# Patient Record
Sex: Female | Born: 1965 | Race: White | Hispanic: No | Smoking: Current every day smoker
Health system: Southern US, Community
[De-identification: ages and names within clinical notes are randomized; demographics above are authoritative.]

## PROBLEM LIST (undated history)

## (undated) DIAGNOSIS — K635 Polyp of colon: Secondary | ICD-10-CM

## (undated) DIAGNOSIS — F4320 Adjustment disorder, unspecified: Secondary | ICD-10-CM

## (undated) DIAGNOSIS — M4310 Spondylolisthesis, site unspecified: Secondary | ICD-10-CM

## (undated) DIAGNOSIS — F4321 Adjustment disorder with depressed mood: Secondary | ICD-10-CM

## (undated) DIAGNOSIS — K219 Gastro-esophageal reflux disease without esophagitis: Secondary | ICD-10-CM

## (undated) DIAGNOSIS — F4310 Post-traumatic stress disorder, unspecified: Secondary | ICD-10-CM

## (undated) DIAGNOSIS — N83209 Unspecified ovarian cyst, unspecified side: Secondary | ICD-10-CM

## (undated) DIAGNOSIS — M722 Plantar fascial fibromatosis: Secondary | ICD-10-CM

## (undated) DIAGNOSIS — Z8489 Family history of other specified conditions: Secondary | ICD-10-CM

## (undated) DIAGNOSIS — L732 Hidradenitis suppurativa: Secondary | ICD-10-CM

## (undated) DIAGNOSIS — R002 Palpitations: Secondary | ICD-10-CM

## (undated) DIAGNOSIS — R51 Headache: Secondary | ICD-10-CM

## (undated) DIAGNOSIS — M1990 Unspecified osteoarthritis, unspecified site: Secondary | ICD-10-CM

## (undated) DIAGNOSIS — K2270 Barrett's esophagus without dysplasia: Secondary | ICD-10-CM

## (undated) DIAGNOSIS — F32A Depression, unspecified: Secondary | ICD-10-CM

## (undated) DIAGNOSIS — F329 Major depressive disorder, single episode, unspecified: Secondary | ICD-10-CM

## (undated) DIAGNOSIS — F319 Bipolar disorder, unspecified: Secondary | ICD-10-CM

## (undated) DIAGNOSIS — F419 Anxiety disorder, unspecified: Secondary | ICD-10-CM

## (undated) DIAGNOSIS — K589 Irritable bowel syndrome without diarrhea: Secondary | ICD-10-CM

## (undated) DIAGNOSIS — F1190 Opioid use, unspecified, uncomplicated: Secondary | ICD-10-CM

## (undated) DIAGNOSIS — M1611 Unilateral primary osteoarthritis, right hip: Secondary | ICD-10-CM

## (undated) DIAGNOSIS — F17210 Nicotine dependence, cigarettes, uncomplicated: Secondary | ICD-10-CM

## (undated) DIAGNOSIS — K3184 Gastroparesis: Secondary | ICD-10-CM

## (undated) DIAGNOSIS — M75100 Unspecified rotator cuff tear or rupture of unspecified shoulder, not specified as traumatic: Secondary | ICD-10-CM

## (undated) DIAGNOSIS — F410 Panic disorder [episodic paroxysmal anxiety] without agoraphobia: Secondary | ICD-10-CM

## (undated) DIAGNOSIS — E119 Type 2 diabetes mellitus without complications: Secondary | ICD-10-CM

## (undated) DIAGNOSIS — F411 Generalized anxiety disorder: Secondary | ICD-10-CM

## (undated) DIAGNOSIS — E785 Hyperlipidemia, unspecified: Secondary | ICD-10-CM

## (undated) DIAGNOSIS — G8929 Other chronic pain: Secondary | ICD-10-CM

## (undated) DIAGNOSIS — G2581 Restless legs syndrome: Secondary | ICD-10-CM

## (undated) DIAGNOSIS — G4733 Obstructive sleep apnea (adult) (pediatric): Secondary | ICD-10-CM

## (undated) DIAGNOSIS — Z720 Tobacco use: Secondary | ICD-10-CM

## (undated) DIAGNOSIS — R519 Headache, unspecified: Secondary | ICD-10-CM

## (undated) DIAGNOSIS — R0609 Other forms of dyspnea: Secondary | ICD-10-CM

## (undated) DIAGNOSIS — Z87442 Personal history of urinary calculi: Secondary | ICD-10-CM

## (undated) HISTORY — DX: Tobacco use: Z72.0

## (undated) HISTORY — DX: Restless legs syndrome: G25.81

## (undated) HISTORY — DX: Depression, unspecified: F32.A

## (undated) HISTORY — DX: Major depressive disorder, single episode, unspecified: F32.9

## (undated) HISTORY — DX: Adjustment disorder with depressed mood: F43.21

## (undated) HISTORY — PX: CHOLECYSTECTOMY: SHX55

## (undated) HISTORY — PX: PLANTAR FASCIA SURGERY: SHX746

## (undated) HISTORY — DX: Unspecified osteoarthritis, unspecified site: M19.90

## (undated) HISTORY — PX: GANGLION CYST EXCISION: SHX1691

## (undated) HISTORY — PX: WRIST SURGERY: SHX841

## (undated) HISTORY — DX: Plantar fascial fibromatosis: M72.2

## (undated) HISTORY — DX: Headache, unspecified: R51.9

## (undated) HISTORY — DX: Anxiety disorder, unspecified: F41.9

## (undated) HISTORY — DX: Hidradenitis suppurativa: L73.2

## (undated) HISTORY — PX: ABDOMINAL HYSTERECTOMY: SHX81

## (undated) HISTORY — DX: Polyp of colon: K63.5

## (undated) HISTORY — DX: Headache: R51

## (undated) HISTORY — DX: Irritable bowel syndrome, unspecified: K58.9

## (undated) HISTORY — DX: Spondylolisthesis, site unspecified: M43.10

## (undated) HISTORY — DX: Barrett's esophagus without dysplasia: K22.70

## (undated) HISTORY — DX: Adjustment disorder, unspecified: F43.20

## (undated) HISTORY — DX: Unspecified ovarian cyst, unspecified side: N83.209

## (undated) HISTORY — DX: Other chronic pain: G89.29

## (undated) HISTORY — PX: OTHER SURGICAL HISTORY: SHX169

## (undated) HISTORY — PX: TUBAL LIGATION: SHX77

## (undated) HISTORY — DX: Obstructive sleep apnea (adult) (pediatric): G47.33

## (undated) HISTORY — PX: ELBOW SURGERY: SHX618

---

## 1997-11-05 ENCOUNTER — Ambulatory Visit (HOSPITAL_BASED_OUTPATIENT_CLINIC_OR_DEPARTMENT_OTHER): Admission: RE | Admit: 1997-11-05 | Discharge: 1997-11-05 | Payer: Self-pay | Admitting: Orthopaedic Surgery

## 1998-03-21 ENCOUNTER — Ambulatory Visit (HOSPITAL_BASED_OUTPATIENT_CLINIC_OR_DEPARTMENT_OTHER): Admission: RE | Admit: 1998-03-21 | Discharge: 1998-03-21 | Payer: Self-pay | Admitting: Orthopaedic Surgery

## 2000-12-12 ENCOUNTER — Encounter: Payer: Self-pay | Admitting: Internal Medicine

## 2000-12-12 ENCOUNTER — Encounter: Admission: RE | Admit: 2000-12-12 | Discharge: 2000-12-12 | Payer: Self-pay | Admitting: Internal Medicine

## 2001-02-22 DIAGNOSIS — T1491XA Suicide attempt, initial encounter: Secondary | ICD-10-CM

## 2001-02-22 HISTORY — DX: Suicide attempt, initial encounter: T14.91XA

## 2001-02-24 ENCOUNTER — Other Ambulatory Visit: Admission: RE | Admit: 2001-02-24 | Discharge: 2001-02-24 | Payer: Self-pay | Admitting: Obstetrics and Gynecology

## 2001-08-18 ENCOUNTER — Encounter (INDEPENDENT_AMBULATORY_CARE_PROVIDER_SITE_OTHER): Payer: Self-pay | Admitting: *Deleted

## 2001-08-18 ENCOUNTER — Observation Stay (HOSPITAL_COMMUNITY): Admission: AD | Admit: 2001-08-18 | Discharge: 2001-08-20 | Payer: Self-pay | Admitting: *Deleted

## 2001-08-18 ENCOUNTER — Encounter: Payer: Self-pay | Admitting: *Deleted

## 2001-10-05 ENCOUNTER — Encounter (INDEPENDENT_AMBULATORY_CARE_PROVIDER_SITE_OTHER): Payer: Self-pay | Admitting: *Deleted

## 2001-10-05 ENCOUNTER — Ambulatory Visit (HOSPITAL_COMMUNITY): Admission: RE | Admit: 2001-10-05 | Discharge: 2001-10-05 | Payer: Self-pay | Admitting: General Surgery

## 2001-10-05 ENCOUNTER — Encounter: Payer: Self-pay | Admitting: General Surgery

## 2001-10-10 ENCOUNTER — Encounter: Payer: Self-pay | Admitting: General Surgery

## 2001-10-10 ENCOUNTER — Encounter (INDEPENDENT_AMBULATORY_CARE_PROVIDER_SITE_OTHER): Payer: Self-pay

## 2001-10-10 ENCOUNTER — Observation Stay (HOSPITAL_COMMUNITY): Admission: RE | Admit: 2001-10-10 | Discharge: 2001-10-11 | Payer: Self-pay | Admitting: General Surgery

## 2001-11-13 ENCOUNTER — Other Ambulatory Visit (HOSPITAL_COMMUNITY): Admission: RE | Admit: 2001-11-13 | Discharge: 2001-11-24 | Payer: Self-pay | Admitting: Psychiatry

## 2001-11-27 ENCOUNTER — Encounter: Admission: RE | Admit: 2001-11-27 | Discharge: 2001-11-27 | Payer: Self-pay | Admitting: Psychiatry

## 2001-12-04 ENCOUNTER — Encounter: Admission: RE | Admit: 2001-12-04 | Discharge: 2001-12-04 | Payer: Self-pay | Admitting: Psychiatry

## 2001-12-12 ENCOUNTER — Encounter: Admission: RE | Admit: 2001-12-12 | Discharge: 2001-12-12 | Payer: Self-pay | Admitting: Psychiatry

## 2001-12-25 ENCOUNTER — Encounter: Admission: RE | Admit: 2001-12-25 | Discharge: 2001-12-25 | Payer: Self-pay | Admitting: Psychiatry

## 2002-01-29 ENCOUNTER — Encounter: Admission: RE | Admit: 2002-01-29 | Discharge: 2002-01-29 | Payer: Self-pay | Admitting: Psychiatry

## 2002-01-30 ENCOUNTER — Encounter: Admission: RE | Admit: 2002-01-30 | Discharge: 2002-01-30 | Payer: Self-pay | Admitting: Psychiatry

## 2002-02-01 ENCOUNTER — Encounter: Admission: RE | Admit: 2002-02-01 | Discharge: 2002-02-01 | Payer: Self-pay | Admitting: Psychiatry

## 2002-02-26 ENCOUNTER — Encounter: Admission: RE | Admit: 2002-02-26 | Discharge: 2002-02-26 | Payer: Self-pay | Admitting: Psychiatry

## 2002-03-01 ENCOUNTER — Encounter: Admission: RE | Admit: 2002-03-01 | Discharge: 2002-03-01 | Payer: Self-pay | Admitting: Professional Counselor

## 2002-06-25 ENCOUNTER — Encounter: Admission: RE | Admit: 2002-06-25 | Discharge: 2002-06-25 | Payer: Self-pay | Admitting: Professional Counselor

## 2002-10-15 ENCOUNTER — Other Ambulatory Visit: Admission: RE | Admit: 2002-10-15 | Discharge: 2002-10-15 | Payer: Self-pay | Admitting: Obstetrics and Gynecology

## 2002-10-18 ENCOUNTER — Ambulatory Visit: Admission: RE | Admit: 2002-10-18 | Discharge: 2002-10-18 | Payer: Self-pay | Admitting: Obstetrics and Gynecology

## 2002-10-18 ENCOUNTER — Encounter: Payer: Self-pay | Admitting: Obstetrics and Gynecology

## 2002-11-13 ENCOUNTER — Ambulatory Visit (HOSPITAL_COMMUNITY): Admission: RE | Admit: 2002-11-13 | Discharge: 2002-11-13 | Payer: Self-pay | Admitting: Pediatrics

## 2002-11-13 ENCOUNTER — Encounter (INDEPENDENT_AMBULATORY_CARE_PROVIDER_SITE_OTHER): Payer: Self-pay | Admitting: Specialist

## 2003-09-05 ENCOUNTER — Ambulatory Visit (HOSPITAL_COMMUNITY): Admission: RE | Admit: 2003-09-05 | Discharge: 2003-09-05 | Payer: Self-pay | Admitting: Obstetrics and Gynecology

## 2003-10-10 ENCOUNTER — Other Ambulatory Visit: Admission: RE | Admit: 2003-10-10 | Discharge: 2003-10-10 | Payer: Self-pay | Admitting: Obstetrics and Gynecology

## 2004-03-05 ENCOUNTER — Other Ambulatory Visit: Admission: RE | Admit: 2004-03-05 | Discharge: 2004-03-05 | Payer: Self-pay | Admitting: Obstetrics and Gynecology

## 2005-07-16 ENCOUNTER — Observation Stay (HOSPITAL_COMMUNITY): Admission: EM | Admit: 2005-07-16 | Discharge: 2005-07-17 | Payer: Self-pay | Admitting: Emergency Medicine

## 2005-07-16 ENCOUNTER — Ambulatory Visit: Payer: Self-pay | Admitting: Cardiology

## 2005-07-22 ENCOUNTER — Encounter: Payer: Self-pay | Admitting: Internal Medicine

## 2005-07-22 LAB — CONVERTED CEMR LAB

## 2005-07-27 ENCOUNTER — Ambulatory Visit: Payer: Self-pay

## 2005-07-29 ENCOUNTER — Ambulatory Visit: Payer: Self-pay | Admitting: Cardiology

## 2005-07-29 ENCOUNTER — Encounter: Payer: Self-pay | Admitting: Internal Medicine

## 2005-07-29 LAB — CONVERTED CEMR LAB
Blood Glucose, Fasting: 74 mg/dL
INR: 1
aPTT: 29 s

## 2005-08-02 ENCOUNTER — Inpatient Hospital Stay (HOSPITAL_BASED_OUTPATIENT_CLINIC_OR_DEPARTMENT_OTHER): Admission: RE | Admit: 2005-08-02 | Discharge: 2005-08-02 | Payer: Self-pay | Admitting: Cardiology

## 2005-08-02 ENCOUNTER — Ambulatory Visit: Payer: Self-pay | Admitting: Cardiology

## 2005-08-19 ENCOUNTER — Ambulatory Visit: Payer: Self-pay | Admitting: Internal Medicine

## 2005-08-27 ENCOUNTER — Ambulatory Visit: Payer: Self-pay | Admitting: Internal Medicine

## 2005-09-10 ENCOUNTER — Ambulatory Visit: Payer: Self-pay | Admitting: Internal Medicine

## 2005-10-12 ENCOUNTER — Ambulatory Visit: Payer: Self-pay | Admitting: Internal Medicine

## 2005-10-13 ENCOUNTER — Encounter (INDEPENDENT_AMBULATORY_CARE_PROVIDER_SITE_OTHER): Payer: Self-pay | Admitting: Specialist

## 2005-10-13 ENCOUNTER — Ambulatory Visit: Payer: Self-pay | Admitting: Internal Medicine

## 2005-10-15 ENCOUNTER — Ambulatory Visit: Payer: Self-pay | Admitting: Internal Medicine

## 2005-12-15 ENCOUNTER — Ambulatory Visit: Payer: Self-pay | Admitting: Internal Medicine

## 2005-12-25 ENCOUNTER — Emergency Department (HOSPITAL_COMMUNITY): Admission: EM | Admit: 2005-12-25 | Discharge: 2005-12-25 | Payer: Self-pay | Admitting: Emergency Medicine

## 2005-12-27 ENCOUNTER — Encounter (INDEPENDENT_AMBULATORY_CARE_PROVIDER_SITE_OTHER): Payer: Self-pay | Admitting: *Deleted

## 2005-12-27 ENCOUNTER — Inpatient Hospital Stay (HOSPITAL_COMMUNITY): Admission: AD | Admit: 2005-12-27 | Discharge: 2005-12-27 | Payer: Self-pay | Admitting: Obstetrics and Gynecology

## 2006-01-03 ENCOUNTER — Ambulatory Visit: Payer: Self-pay | Admitting: Internal Medicine

## 2006-01-12 ENCOUNTER — Ambulatory Visit: Payer: Self-pay | Admitting: Internal Medicine

## 2006-01-12 ENCOUNTER — Encounter (INDEPENDENT_AMBULATORY_CARE_PROVIDER_SITE_OTHER): Payer: Self-pay | Admitting: *Deleted

## 2006-01-12 LAB — HM COLONOSCOPY: HM Colonoscopy: ABNORMAL

## 2006-01-24 ENCOUNTER — Encounter (INDEPENDENT_AMBULATORY_CARE_PROVIDER_SITE_OTHER): Payer: Self-pay | Admitting: *Deleted

## 2006-01-25 ENCOUNTER — Inpatient Hospital Stay (HOSPITAL_COMMUNITY): Admission: RE | Admit: 2006-01-25 | Discharge: 2006-01-26 | Payer: Self-pay | Admitting: Obstetrics and Gynecology

## 2006-12-17 ENCOUNTER — Encounter: Payer: Self-pay | Admitting: Internal Medicine

## 2007-01-27 ENCOUNTER — Ambulatory Visit: Payer: Self-pay | Admitting: Internal Medicine

## 2007-08-24 ENCOUNTER — Ambulatory Visit: Payer: Self-pay | Admitting: Internal Medicine

## 2007-08-24 ENCOUNTER — Telehealth: Payer: Self-pay | Admitting: Internal Medicine

## 2007-08-31 ENCOUNTER — Ambulatory Visit: Payer: Self-pay | Admitting: Internal Medicine

## 2007-08-31 ENCOUNTER — Encounter: Payer: Self-pay | Admitting: Internal Medicine

## 2007-09-02 ENCOUNTER — Encounter: Payer: Self-pay | Admitting: Internal Medicine

## 2007-09-18 ENCOUNTER — Ambulatory Visit: Payer: Self-pay | Admitting: Internal Medicine

## 2007-09-28 ENCOUNTER — Ambulatory Visit: Payer: Self-pay | Admitting: Internal Medicine

## 2007-09-28 DIAGNOSIS — G8929 Other chronic pain: Secondary | ICD-10-CM | POA: Insufficient documentation

## 2007-09-28 DIAGNOSIS — R51 Headache: Secondary | ICD-10-CM

## 2008-02-15 ENCOUNTER — Encounter: Payer: Self-pay | Admitting: Internal Medicine

## 2008-03-07 ENCOUNTER — Encounter: Payer: Self-pay | Admitting: Internal Medicine

## 2008-03-11 ENCOUNTER — Telehealth: Payer: Self-pay | Admitting: Internal Medicine

## 2008-06-17 ENCOUNTER — Encounter: Payer: Self-pay | Admitting: Internal Medicine

## 2008-08-16 ENCOUNTER — Telehealth: Payer: Self-pay | Admitting: Internal Medicine

## 2008-10-03 ENCOUNTER — Ambulatory Visit: Payer: Self-pay | Admitting: Internal Medicine

## 2008-10-04 ENCOUNTER — Emergency Department (HOSPITAL_COMMUNITY): Admission: EM | Admit: 2008-10-04 | Discharge: 2008-10-04 | Payer: Self-pay | Admitting: Emergency Medicine

## 2008-10-04 DIAGNOSIS — K2270 Barrett's esophagus without dysplasia: Secondary | ICD-10-CM | POA: Insufficient documentation

## 2008-10-04 DIAGNOSIS — IMO0002 Reserved for concepts with insufficient information to code with codable children: Secondary | ICD-10-CM | POA: Insufficient documentation

## 2008-10-09 ENCOUNTER — Ambulatory Visit: Payer: Self-pay | Admitting: Internal Medicine

## 2008-10-10 ENCOUNTER — Ambulatory Visit: Payer: Self-pay | Admitting: Internal Medicine

## 2008-10-25 ENCOUNTER — Ambulatory Visit: Payer: Self-pay | Admitting: Licensed Clinical Social Worker

## 2008-11-11 ENCOUNTER — Ambulatory Visit: Payer: Self-pay | Admitting: Internal Medicine

## 2008-11-22 ENCOUNTER — Telehealth: Payer: Self-pay | Admitting: Internal Medicine

## 2008-11-28 ENCOUNTER — Telehealth: Payer: Self-pay | Admitting: Internal Medicine

## 2009-01-15 ENCOUNTER — Telehealth: Payer: Self-pay | Admitting: Family

## 2009-02-24 ENCOUNTER — Encounter: Payer: Self-pay | Admitting: Internal Medicine

## 2009-04-28 ENCOUNTER — Ambulatory Visit: Payer: Self-pay | Admitting: Internal Medicine

## 2009-04-28 LAB — CONVERTED CEMR LAB
ALT: 19 units/L (ref 0–35)
Alkaline Phosphatase: 56 units/L (ref 39–117)
BUN: 10 mg/dL (ref 6–23)
Basophils Absolute: 0 10*3/uL (ref 0.0–0.1)
Basophils Relative: 0.7 % (ref 0.0–3.0)
Bilirubin Urine: NEGATIVE
CO2: 29 meq/L (ref 19–32)
Creatinine, Ser: 0.9 mg/dL (ref 0.4–1.2)
Eosinophils Absolute: 0.1 10*3/uL (ref 0.0–0.7)
Eosinophils Relative: 1.8 % (ref 0.0–5.0)
Hemoglobin: 11.9 g/dL — ABNORMAL LOW (ref 12.0–15.0)
Ketones, ur: NEGATIVE mg/dL
Lymphs Abs: 2 10*3/uL (ref 0.7–4.0)
MCV: 95.9 fL (ref 78.0–100.0)
Monocytes Absolute: 0.5 10*3/uL (ref 0.1–1.0)
Neutro Abs: 2.3 10*3/uL (ref 1.4–7.7)
Neutrophils Relative %: 46.9 % (ref 43.0–77.0)
Nitrite: NEGATIVE
Potassium: 3.7 meq/L (ref 3.5–5.1)
RBC: 3.64 M/uL — ABNORMAL LOW (ref 3.87–5.11)
Sodium: 141 meq/L (ref 135–145)
Total Protein: 6.4 g/dL (ref 6.0–8.3)
Triglycerides: 100 mg/dL (ref 0.0–149.0)
Urobilinogen, UA: 1 (ref 0.0–1.0)
VLDL: 20 mg/dL (ref 0.0–40.0)
WBC: 4.9 10*3/uL (ref 4.5–10.5)

## 2009-05-05 ENCOUNTER — Ambulatory Visit: Payer: Self-pay | Admitting: Internal Medicine

## 2009-05-05 DIAGNOSIS — E785 Hyperlipidemia, unspecified: Secondary | ICD-10-CM | POA: Insufficient documentation

## 2009-05-06 ENCOUNTER — Telehealth: Payer: Self-pay | Admitting: Internal Medicine

## 2009-05-12 ENCOUNTER — Telehealth: Payer: Self-pay | Admitting: Internal Medicine

## 2009-05-12 ENCOUNTER — Ambulatory Visit (HOSPITAL_BASED_OUTPATIENT_CLINIC_OR_DEPARTMENT_OTHER): Admission: RE | Admit: 2009-05-12 | Discharge: 2009-05-12 | Payer: Self-pay | Admitting: Internal Medicine

## 2009-05-12 ENCOUNTER — Ambulatory Visit: Payer: Self-pay | Admitting: Internal Medicine

## 2009-05-12 ENCOUNTER — Ambulatory Visit: Payer: Self-pay | Admitting: Diagnostic Radiology

## 2009-05-26 ENCOUNTER — Telehealth: Payer: Self-pay | Admitting: Internal Medicine

## 2009-05-26 ENCOUNTER — Encounter: Payer: Self-pay | Admitting: Internal Medicine

## 2009-06-25 ENCOUNTER — Telehealth: Payer: Self-pay | Admitting: Internal Medicine

## 2009-07-22 ENCOUNTER — Encounter (INDEPENDENT_AMBULATORY_CARE_PROVIDER_SITE_OTHER): Payer: Self-pay | Admitting: *Deleted

## 2009-07-24 ENCOUNTER — Ambulatory Visit: Payer: Self-pay | Admitting: Internal Medicine

## 2009-08-04 ENCOUNTER — Ambulatory Visit: Payer: Self-pay | Admitting: Diagnostic Radiology

## 2009-08-04 ENCOUNTER — Ambulatory Visit (HOSPITAL_BASED_OUTPATIENT_CLINIC_OR_DEPARTMENT_OTHER): Admission: RE | Admit: 2009-08-04 | Discharge: 2009-08-04 | Payer: Self-pay | Admitting: Internal Medicine

## 2009-08-04 ENCOUNTER — Ambulatory Visit: Payer: Self-pay | Admitting: Internal Medicine

## 2009-08-05 ENCOUNTER — Encounter: Payer: Self-pay | Admitting: Internal Medicine

## 2009-08-07 ENCOUNTER — Ambulatory Visit: Payer: Self-pay | Admitting: Internal Medicine

## 2009-08-11 ENCOUNTER — Encounter: Payer: Self-pay | Admitting: Internal Medicine

## 2009-09-01 ENCOUNTER — Telehealth: Payer: Self-pay | Admitting: Internal Medicine

## 2009-09-08 ENCOUNTER — Telehealth: Payer: Self-pay | Admitting: Internal Medicine

## 2009-09-26 ENCOUNTER — Encounter: Payer: Self-pay | Admitting: Internal Medicine

## 2009-09-26 ENCOUNTER — Ambulatory Visit (HOSPITAL_BASED_OUTPATIENT_CLINIC_OR_DEPARTMENT_OTHER): Admission: RE | Admit: 2009-09-26 | Discharge: 2009-09-26 | Payer: Self-pay | Admitting: Internal Medicine

## 2009-10-06 ENCOUNTER — Ambulatory Visit: Payer: Self-pay | Admitting: Internal Medicine

## 2009-10-09 ENCOUNTER — Ambulatory Visit: Payer: Self-pay | Admitting: Pulmonary Disease

## 2009-10-13 ENCOUNTER — Telehealth: Payer: Self-pay | Admitting: Internal Medicine

## 2009-11-20 ENCOUNTER — Ambulatory Visit: Payer: Self-pay | Admitting: Internal Medicine

## 2009-11-21 ENCOUNTER — Telehealth: Payer: Self-pay | Admitting: Internal Medicine

## 2009-12-05 ENCOUNTER — Telehealth: Payer: Self-pay | Admitting: Internal Medicine

## 2009-12-29 ENCOUNTER — Telehealth: Payer: Self-pay | Admitting: Internal Medicine

## 2010-01-20 ENCOUNTER — Ambulatory Visit: Payer: Self-pay | Admitting: Internal Medicine

## 2010-01-21 ENCOUNTER — Telehealth: Payer: Self-pay | Admitting: Internal Medicine

## 2010-01-22 ENCOUNTER — Ambulatory Visit: Payer: Self-pay | Admitting: Internal Medicine

## 2010-01-22 LAB — CONVERTED CEMR LAB
Eosinophils Absolute: 0.1 10*3/uL (ref 0.0–0.7)
Eosinophils Relative: 1.4 % (ref 0.0–5.0)
Lymphs Abs: 3.8 10*3/uL (ref 0.7–4.0)
MCHC: 34.7 g/dL (ref 30.0–36.0)
MCV: 95.9 fL (ref 78.0–100.0)
Monocytes Relative: 8.3 % (ref 3.0–12.0)
Platelets: 165 10*3/uL (ref 150.0–400.0)
RDW: 12.9 % (ref 11.5–14.6)
Saturation Ratios: 25.3 % (ref 20.0–50.0)

## 2010-01-23 ENCOUNTER — Encounter: Payer: Self-pay | Admitting: Internal Medicine

## 2010-02-22 HISTORY — PX: COLONOSCOPY W/ POLYPECTOMY: SHX1380

## 2010-03-26 NOTE — Medication Information (Signed)
Summary: Prior Authorization for Venlafaxine/[employer] [place]  Prior Authorization for Venlafaxine/[employer] [place]   Imported By: Lanelle Bal 05/30/2009 08:54:37  _____________________________________________________________________  External Attachment:    Type:   Image     Comment:   External Document

## 2010-03-26 NOTE — Progress Notes (Signed)
Summary: Xanax refill  Phone Note Refill Request Message from:  Fax from Pharmacy on Jun 25, 2009 4:33 PM  Refills Requested: Medication #1:  XANAX 0.25 MG   Brand Name Necessary? No   Supply Requested: 1 month   Last Refilled: 01/15/2009  Method Requested: Telephone to Pharmacy Next Appointment Scheduled: None Initial call taken by: Glendell Docker CMA,  Jun 25, 2009 4:34 PM  Follow-up for Phone Call        ok to refill x 1.  I suggest OV within 2 months Follow-up by: D. Thomos Lemons DO,  Jun 26, 2009 10:18 AM  Additional Follow-up for Phone Call Additional follow up Details #1::        Rx called to pharmacist Trinna Post. Call placed to patient at [phone] she was advised to schedule 2 month follow up per Dr Artist Pais. She has scheduled appointment for 08/04/2009 @ 2:15p Additional Follow-up by: Glendell Docker CMA,  Jun 26, 2009 3:42 PM    New/Updated Medications: XANAX 0.25 MG TABS (ALPRAZOLAM) Take 1 tablet by mouth once a day as needed Prescriptions: XANAX 0.25 MG TABS (ALPRAZOLAM) Take 1 tablet by mouth once a day as needed  #30 x 1   Entered by:   Glendell Docker CMA   Authorized by:   D. Thomos Lemons DO   Signed by:   Glendell Docker CMA on 06/26/2009   Method used:   Telephoned to ...       [id number]       [address]       [place], [place]  [address]       Ph: [phone] or [phone]       Fax: [phone]   RxID:   [id number]

## 2010-03-26 NOTE — Assessment & Plan Note (Signed)
Summary: 2 MONTH FOLLOW UP/DK--rm 3   Vital Signs:  Patient profile:   45 year old female Height:      66 inches Weight:      214 pounds BMI:     34.67 Temp:     98.1 degrees F oral Pulse rate:   72 / minute Pulse rhythm:   regular Resp:     16 per minute BP sitting:   110 / 70  (right arm) Cuff size:   large  Vitals Entered By: Mervin Kung CMA (August 04, 2009 2:54 PM) CC: Room 3  Pt states she stubbed her right great toe this weekend.  Toe is swollen and it is difficult to walk.   Primary Care Provider:  Dondra Spry DO  CC:  Room 3  Pt states she stubbed her right great toe this weekend.  Toe is swollen and it is difficult to walk.Marland Kitchen  History of Present Illness: 45 y/o adjustment d/o with depressed /anxious mood - some improvement with effexor  c/o right toe pain she hit her great toe against concrete she was clogging - country dance significant pain with walking   Allergies: 1)  ! Pcn  Past History:  Past Medical History: History of depression Tobacco use Hx of plantar fascitis      Barrett's Esophagus     COLONIC POLYPS, HYPERPLASTIC, HX OF (ICD-V12.72) GASTRITIS, HX OF (ICD-V12.79) INTERNAL HEMORRHOIDS (ICD-455.0) Family Hx of COLON CANCER (ICD-153.9)  IRRITABLE BOWEL SYNDROME (ICD-564.1) BARRETTS ESOPHAGUS (ICD-530.85) GRIEF REACTION (ICD-309.0) HEADACHE, CHRONIC (ICD-784.0) SUNBURN (ICD-692.71) HIDRADENITIS SUPPURATIVA (ICD-705.83) BRONCHITIS (ICD-490) CHEST PAIN, ATYPICAL (ICD-786.59) TOBACCO ABUSE (ICD-305.1) Hx of PLANTAR FASCIITIS (ICD-728.71) DEPRESSION, HX OF (ICD-V11.8)  Past Surgical History: Wrist Surgey   Cesarean section x 2 Cholecystectomy   Tubal ligation   Surgery for plantar fascitis-right foot   Family History:  Mother alive at age 50 has chronic obstructive pulmonary disease, coronary artery disease, a history of stroke and also reflex sympathetic dystrophy.   She is status post pacemaker.  Father is age 39 with  hypertension.  She is estranged from her father.  In her grandparents there is a family history of colon cancer.  Family History of Colon Cancer: Maternal Grandfather-diagnosed in his 47's. Family History of Colon Polyps: Maternal grandfather, Mother, Brother x 2 Family History of Breast Cancer: Mother       Social History: Occupation: Metallurgist Divorced  Alcohol use-yes -x 5 per weekend Current Smoker < 1 ppd      Physical Exam  General:  alert and overweight-appearing.   Lungs:  normal respiratory effort and normal breath sounds.   Heart:  normal rate, regular rhythm, and no gallop.   Msk:  left great toe slightly swollen,  minimal redness but very tender to touch Psych:  normally interactive, good eye contact, and not depressed appearing.     Impression & Recommendations:  Problem # 1:  SOMNOLENCE (ICD-780.09) chronic fatigue.  boyfriend reports snoring.  he also noticed restless sleep.  question PLMD. arrange sleep study  Orders: Sleep Disorder Referral (Sleep Disorder)  Problem # 2:  PAIN IN SOFT TISSUES OF LIMB (ICD-729.5) jammed her left great toe on concrete.  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 Take 1 capsule by mouth two times a day 2)  Venlafaxine Hcl 150 Mg Xr24h-cap (  Venlafaxine hcl) .... One by mouth once daily 3)  Xanax 0.25 Mg Tabs (Alprazolam) .... Take 1 tablet by mouth once a day as needed  Patient Instructions: 1)  Please schedule a follow-up appointment in 2 months. Prescriptions: VENLAFAXINE HCL 150 MG XR24H-CAP (VENLAFAXINE HCL) one by mouth once daily  #30 x 3   Entered and Authorized by:   D. Thomos Lemons DO   Signed by:   D. Thomos Lemons DO on 08/04/2009   Method used:   [facility] to        [id number]        [address]       [place], [place]  [address]       Ph: [phone] or [phone]       Fax: [phone]   RxID:   [id number]

## 2010-03-26 NOTE — Progress Notes (Signed)
Summary: NOW RUNNING A FEVER   Phone Note Call from Patient Call back at Home Phone [phone]   Caller: PT LIVE Call For: Jamilya Sarrazin  Summary of Call: SHE IS NOW RUNNING A FEVER OF 100.  SHE WAS IN YESTERDAY AND DR Monee Dembeck SAID SHE HAS BRONCHITIS SHOULD SHE BE WORRIED THAT SHE NOW HAS A FEVER.   THE COUGH MEDICINE IS NOT WORKING WELL.   Initial call taken by: Roselle Locus,  May 06, 2009 1:39 PM  Follow-up for Phone Call        did she start abx Follow-up by: D. Thomos Lemons DO,  May 06, 2009 2:13 PM  Additional Follow-up for Phone Call Additional follow up Details #1::        call placed to patient at [phone], no answer, voice message left for patient to return call to see if she started antibiotics Additional Follow-up by: Glendell Docker CMA,  May 06, 2009 3:40 PM    Additional Follow-up for Phone Call Additional follow up Details #2::    Patient states she started antibotic yesterday      #  [phone] Follow-up by: Darral Dash,  May 06, 2009 4:05 PM  Additional Follow-up for Phone Call Additional follow up Details #3:: Details for Additional Follow-up Action Taken: take ceftin in addition to azithromycin.   see rx Additional Follow-up by: D. Thomos Lemons DO,  May 06, 2009 4:19 PM  New/Updated Medications: CEFUROXIME AXETIL 500 MG TABS (CEFUROXIME AXETIL) one by mouth bid Prescriptions: CEFUROXIME AXETIL 500 MG TABS (CEFUROXIME AXETIL) one by mouth bid  #14 x 0   Entered and Authorized by:   D. Thomos Lemons DO   Signed by:   D. Thomos Lemons DO on 05/06/2009   Method used:   [facility]        [id number]       [address]       [place], [place]  [address]       Ph: [phone] or [phone]       Fax: [phone]   RxID:   [id number]  Patient advised per Dr Artist Pais instructions Glendell Docker CMA  May 06, 2009 4:56 PM

## 2010-03-26 NOTE — Progress Notes (Signed)
Summary:  ? Medication Side effects  Phone Note Call from Patient Call back at Home Phone [phone]   Caller: Patient Call For: D. Thomos Lemons DO Summary of Call: patient called and left voice message stating she took a dose of Klonipin last night and she was "wired". She states she rearranged her dining area, and tossed and turned all night. She would like to know if this is a normal effect of the medication.  Initial call taken by: Glendell Docker CMA,  November 21, 2009 11:47 AM  Follow-up for Phone Call        no, stop clonazepam.  see rx change for diazepam Follow-up by: D. Thomos Lemons DO,  November 21, 2009 11:52 AM  Additional Follow-up for Phone Call Additional follow up Details #1::        call placed to patient at [phone], no answer. A detailed voice message was left informing patient per Dr Artist Pais instructions.  Message was left for patient to call if any questions. Rx called to pharmacy Additional Follow-up by: Glendell Docker CMA,  November 21, 2009 11:58 AM    New/Updated Medications: DIAZEPAM 2 MG TABS (DIAZEPAM) 1/2 to one tab at bedtime as needed Prescriptions: DIAZEPAM 2 MG TABS (DIAZEPAM) 1/2 to one tab at bedtime as needed  #30 x 0   Entered and Authorized by:   D. Thomos Lemons DO   Signed by:   D. Thomos Lemons DO on 11/21/2009   Method used:   Telephoned to ...       [id number]       [address]       [place], [place]  [address]       Ph: [phone] or [phone]       Fax: [phone]   RxID:   [id number]

## 2010-03-26 NOTE — Letter (Signed)
   [place] [facility] [address] [place], [place]  [address]  [place] Phone: [phone]      January 23, 2010   [facility] [address] [place], [place] [address]  RE:  LAB RESULTS  Dear  [name] White,  The following is an interpretation of your most recent lab tests.  Please take note of any instructions provided or changes to medications that have resulted from your lab work.    CBC:  Good - no changes needed  Iron studies - normal   Iron deficiency is not contributing  to your restless leg syndrome.        Sincerely Yours,    Dr. Thomos Lemons  Appended Document:  mailed

## 2010-03-26 NOTE — Letter (Signed)
Summary: Patient [facility] Biopsy Results  [facility]  [address] [place], [place] [address]   Phone: [phone]  Fax: [phone]        August 11, 2009 MRN: [id number]    [facility] [address] [place], [place]  [address]    Dear Ms. Stith,  I am pleased to inform you that the biopsies taken during your recent endoscopic examination did not show any evidence of cancer upon pathologic examination.Mild inflammation due to reflux.  Additional information/recommendations:  __No further action is needed at this time.  Please follow-up with      your primary care physician for your other healthcare needs.  __ Please call [phone] to schedule a return visit to review      your condition.  _x_ Continue with the treatment plan as outlined on the day of your      exam.     Please call us if you are having persistent problems or have questions about your condition that have not been fully answered at this time.  Sincerely,  Hart Carwin MD  This letter has been electronically signed by your physician.  Appended Document: Patient Notice-Endo Biopsy Results letter mailed.

## 2010-03-26 NOTE — Assessment & Plan Note (Signed)
Summary: 2 month follow up/mhf   Vital Signs:  Patient profile:   45 year old female Height:      66 inches Weight:      211.75 pounds BMI:     34.30 O2 Sat:      99 % on Room air Temp:     98.4 degrees F oral Pulse rate:   80 / minute Pulse rhythm:   regular Resp:     20 per minute BP sitting:   108 / 60  (right arm) Cuff size:   large  Vitals Entered By: Glendell Docker CMA (October 06, 2009 3:23 PM)  O2 Flow:  Room air CC: 2 Month Follow up  Is Patient Diabetic? No Pain Assessment Patient in pain? no      Comments follow up on sleep study, no concerns   Primary Care Provider:  Dondra Spry DO  CC:  2 Month Follow up .  History of Present Illness: 45 y/o white female with adj c/o for f/u doing well still feeling tired.  had sleep study  results pending  Preventive Screening-Counseling & Management  Alcohol-Tobacco     Smoking Status: current  Allergies: 1)  ! Pcn  Past History:  Past Medical History: History of depression Tobacco use Hx of plantar fascitis      Barrett's Esophagus      COLONIC POLYPS, HYPERPLASTIC, HX OF (ICD-V12.72) GASTRITIS, HX OF (ICD-V12.79) INTERNAL HEMORRHOIDS (ICD-455.0) Family Hx of COLON CANCER (ICD-153.9)  IRRITABLE BOWEL SYNDROME (ICD-564.1) BARRETTS ESOPHAGUS (ICD-530.85) GRIEF REACTION (ICD-309.0) HEADACHE, CHRONIC (ICD-784.0) SUNBURN (ICD-692.71) HIDRADENITIS SUPPURATIVA (ICD-705.83) BRONCHITIS (ICD-490) CHEST PAIN, ATYPICAL (ICD-786.59) TOBACCO ABUSE (ICD-305.1) Hx of PLANTAR FASCIITIS (ICD-728.71) DEPRESSION, HX OF (ICD-V11.8)  Past Surgical History: Wrist Surgey   Cesarean section x 2  Cholecystectomy   Tubal ligation   Surgery for plantar fascitis-right foot   Family History:  Mother alive at age 15 has chronic obstructive pulmonary disease, coronary artery disease, a history of stroke and also reflex sympathetic dystrophy.   She is status post pacemaker.  Father is age 28 with hypertension.  She is  estranged from her father.  In her grandparents there is a family history of colon cancer.  Family History of Colon Cancer: Maternal Grandfather-diagnosed in his 59's. Family History of Colon Polyps: Maternal grandfather, Mother, Brother x 2 Family History of Breast Cancer: Mother        Social History: Occupation: Metallurgist Divorced  Alcohol use-yes -x 5 per weekend Current Smoker < 1 ppd       Physical Exam  General:  alert, well-developed, and well-nourished.   Lungs:  normal respiratory effort and normal breath sounds.   Heart:  normal rate, regular rhythm, and no gallop.   Psych:  normally interactive, good eye contact, and not depressed appearing.     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  2 lbs wt loss  Problem # 2:  SOMNOLENCE (ICD-780.09) awaiting sleep study results discussed wt loss strategies  Complete Medication List: 1)  Omeprazole 40 Mg Cpdr (Omeprazole) .... Take 1 capsule by mouth two times a day 2)  Venlafaxine Hcl 150 Mg Xr24h-cap (Venlafaxine hcl) .... Take 1 capsule by mouth once a day 3)  Xanax 0.25 Mg Tabs (Alprazolam) .... Take 1 tablet by mouth once a day as needed 4)  Lidocaine Viscous 2 % Soln (Lidocaine hcl) .... Take 1 tablespoon (  15 ml) by mouth every 4 hours as needed for esophageal pain  Patient Instructions: 1)  Please schedule a follow-up appointment in 6 months. 2)  Limit your calories to 1200-1400 cal per day (3 - 5 small meals per day) Prescriptions: XANAX 0.25 MG TABS (ALPRAZOLAM) Take 1 tablet by mouth once a day as needed  #30 x 3   Entered and Authorized by:   D. Thomos Lemons DO   Signed by:   D. Thomos Lemons DO on 10/06/2009   Method used:   Print then Give to Patient   RxID:   [id number] VENLAFAXINE HCL 150 MG XR24H-CAP (VENLAFAXINE HCL) Take 1 capsule by mouth once a day   #30 x 5   Entered and Authorized by:   D. Thomos Lemons DO   Signed by:   D. Thomos Lemons DO on 10/06/2009   Method used:   [facility]        [id number]       [address]       [place], [place]  [address]       Ph: [phone] or [phone]       Fax: [phone]   RxID:   [id number] OMEPRAZOLE 40 MG CPDR (OMEPRAZOLE) Take 1 capsule by mouth two times a day  #60 x 5   Entered and Authorized by:   D. Thomos Lemons DO   Signed by:   D. Thomos Lemons DO on 10/06/2009   Method used:   [facility]        [facility]  [address] [id number]       [address]       [place], [place]  [address]       Ph: [phone] or [phone]       Fax: [phone]   RxID:   [id number]   Current Allergies (reviewed today): ! PCN

## 2010-03-26 NOTE — Medication Information (Signed)
Summary: Approval for Venlafaxine/[employer] [place]  Approval for Venlafaxine/[employer] [place]   Imported By: Lanelle Bal 05/30/2009 09:41:34  _____________________________________________________________________  External Attachment:    Type:   Image     Comment:   External Document

## 2010-03-26 NOTE — Assessment & Plan Note (Signed)
Summary: 2 month fu/dt   Vital Signs:  Patient profile:   45 year old female Height:      66 inches Weight:      200.25 pounds BMI:     32.44 O2 Sat:      100 % on Room air Temp:     97.8 degrees F oral Pulse rate:   75 / minute Resp:     18 per minute BP sitting:   122 / 70  (left arm) Cuff size:   large  Vitals Entered By: Glendell Docker CMA (January 20, 2010 3:59 PM)  O2 Flow:  Room air CC: 2 month follow up  Is Patient Diabetic? No Pain Assessment Patient in pain? no      Comments refill on Gabapentin     Last PAP Result app 12/14   Primary Care Teirra Carapia:  Dondra Spry DO  CC:  2 month follow up .  History of Present Illness: 45 y/o white female with RLS for follow up pt had minimal response to mirapex clonazepam made her feel "wired"  made some dietary changes stopped drinking soft drinks and lost weight   Preventive Screening-Counseling & Management  Alcohol-Tobacco     Smoking Status: current  Allergies: 1)  ! Pcn  Past History:  Past Medical History: History of depression Tobacco use Hx of plantar fascitis       Barrett's Esophagus       COLONIC POLYPS, HYPERPLASTIC, HX OF (ICD-V12.72) GASTRITIS, HX OF (ICD-V12.79) INTERNAL HEMORRHOIDS (ICD-455.0) Family Hx of COLON CANCER (ICD-153.9)  IRRITABLE BOWEL SYNDROME (ICD-564.1) BARRETTS ESOPHAGUS (ICD-530.85) GRIEF REACTION (ICD-309.0) HEADACHE, CHRONIC (ICD-784.0) SUNBURN (ICD-692.71) HIDRADENITIS SUPPURATIVA (ICD-705.83) BRONCHITIS (ICD-490) CHEST PAIN, ATYPICAL (ICD-786.59) TOBACCO ABUSE (ICD-305.1) Hx of PLANTAR FASCIITIS (ICD-728.71) DEPRESSION, HX OF (ICD-V11.8)  Past Surgical History: Wrist Surgey   Cesarean section x 2   Cholecystectomy    Tubal ligation   Surgery for plantar fascitis-right foot   Family History:  Mother alive at age 4 has chronic obstructive pulmonary disease, coronary artery disease, a history of stroke and also reflex sympathetic dystrophy.   She is  status post pacemaker.  Father is age 23 with hypertension.  She is estranged from her father.  In her grandparents there is a family history of colon cancer.  Family History of Colon Cancer: Maternal Grandfather-diagnosed in his 2's. Family History of Colon Polyps: Maternal grandfather, Mother, Brother x 2 Family History of Breast Cancer: Mother         Social History: Occupation: Metallurgist Divorced  Alcohol use-yes -x 5 per weekend Current Smoker < 1 ppd         Physical Exam  General:  alert, well-developed, and well-nourished.   Lungs:  normal respiratory effort and normal breath sounds.   Heart:  normal rate, regular rhythm, and no gallop.   Neurologic:  cranial nerves II-XII intact and gait normal.     Impression & Recommendations:  Problem # 1:  RESTLESS LEG SYNDROME (ICD-333.94) Assessment Unchanged trial of requip pt may need both requip and gabapentin rule out iron deficiency  Problem # 2:  OBESITY (ICD-278.00) Assessment: Improved continue dietary changes encouraged regular exercise  Complete Medication List: 1)  Omeprazole 40 Mg Cpdr (Omeprazole) .... Take 1 capsule by mouth two times a day 2)  Venlafaxine Hcl 150 Mg Xr24h-cap (Venlafaxine hcl) .... Take 1 capsule by mouth once a day 3)  Lidocaine Viscous 2 % Soln (Lidocaine hcl) .... Take 1 tablespoon (15 ml) by mouth every  4 hours as needed for esophageal pain 4)  Ropinirole Hcl 0.5 Mg Tabs (Ropinirole hcl) .... 1/2 by mouth at bedtime x 1 week, then 1 by mouth at bedtime x 1 week, then 2 tabs by mouth qhs  Patient Instructions: 1)  Please schedule a follow-up appointment in 2 months. 2)  CBC no Diff prior to visit, ICD-9: 333.94 3)  serum iron, TIBC, ferritin - 333.94 4)  Please return for lab work within 1 week Prescriptions: ROPINIROLE HCL 0.5 MG TABS (ROPINIROLE HCL) 1/2 by mouth at bedtime x 1 week, then 1 by mouth at bedtime x 1 week, then 2 tabs by mouth qhs  #60 x 2   Entered and  Authorized by:   D. Thomos Lemons DO   Signed by:   D. Thomos Lemons DO on 01/20/2010   Method used:   [facility]        [id number]       [address]       [place], [place]  [address]       Ph: [phone] or [phone]       Fax: [phone]   RxID:   [id number]    Orders Added: 1)  Est. Patient Level III [[address]]    Current Allergies (reviewed today): ! PCN    Preventive Care Screening  Pap Smear:    Date:  01/20/2010    Results:  app 12/14

## 2010-03-26 NOTE — Medication Information (Signed)
Summary: Prior Authorization for Venlafaxine/[employer]  Prior Authorization for Venlafaxine/[employer]   Imported By: Lanelle Bal 05/16/2009 10:24:22  _____________________________________________________________________  External Attachment:    Type:   Image     Comment:   External Document

## 2010-03-26 NOTE — Progress Notes (Signed)
Summary: Unable to Sleep  Phone Note Call from Patient Call back at Home Phone [phone]   Caller: Patient Call For: D. Thomos Lemons DO Summary of Call: patient called and left voice message stating the rx she was given by Dr Artist Pais  for Diazepam, does not work. Her message states that she miread the instructions and was taking a whole tablet, but even with doing that she states she stil is not sleeping.  Initial call taken by: Glendell Docker CMA,  December 05, 2009 2:08 PM  Follow-up for Phone Call        try gabapentin.  see rx Follow-up by: D. Thomos Lemons DO,  December 05, 2009 4:37 PM  Additional Follow-up for Phone Call Additional follow up Details #1::        call placed to patient at [phone],no answer. A detailed voice message was left informing patient per Dr Artist Pais instructions. Message was left for patient to call if any questions. Additional Follow-up by: Glendell Docker CMA,  December 08, 2009 8:44 AM    New/Updated Medications: GABAPENTIN 100 MG CAPS (GABAPENTIN) one to two tabs at bedtime Prescriptions: GABAPENTIN 100 MG CAPS (GABAPENTIN) one to two tabs at bedtime  #60 x 1   Entered and Authorized by:   D. Thomos Lemons DO   Signed by:   D. Thomos Lemons DO on 12/05/2009   Method used:   [facility]        [facility]  [address] [id number]       [address]       [place], [place]  [address]       Ph: [phone] or [phone]       Fax: [phone]   RxID:   [id number]

## 2010-03-26 NOTE — Progress Notes (Signed)
Summary: UTI  Phone Note Call from Patient Call back at Home Phone [phone]   Caller: Patient Call For: D. Thomos Lemons DO Summary of Call: patient called and left voice message requesting a rx for UTI. Her messages states that she knows that she has a urinary tract infection and is just needed a rx to take care of it. She also states that she has tried over the counter products with no relief.    Initial call taken by: Glendell Docker CMA,  December 29, 2009 11:20 AM  Follow-up for Phone Call        call was returned to patient at [phone], no answer. A detailed voice message was left for patient informing her that she will need to schedule a office visit if she was in need of antibiotic, and office policy does not permit to prescribe antibiotics without being seen first. Follow-up by: Glendell Docker CMA,  December 29, 2009 11:32 AM

## 2010-03-26 NOTE — Miscellaneous (Signed)
Summary: [facility] Previsit/prep  Clinical Lists Changes  Observations: Added new observation of ALLERGY REV: Done (07/24/2009 9:56)

## 2010-03-26 NOTE — Consult Note (Signed)
Summary: [facility]  [facility]   Imported By: Lanelle Bal 08/13/2009 11:23:28  _____________________________________________________________________  External Attachment:    Type:   Image     Comment:   External Document

## 2010-03-26 NOTE — Assessment & Plan Note (Signed)
Summary: 1 month fu/dt   Vital Signs:  Patient profile:   45 year old female Height:      66 inches Weight:      218 pounds BMI:     35.31 O2 Sat:      97 % on Room air Temp:     98.3 degrees F oral Pulse rate:   78 / minute Pulse rhythm:   regular Resp:     18 per minute BP sitting:   90 / 60  (right arm) Cuff size:   regular  Vitals Entered By: Glendell Docker CMA (November 20, 2009 4:14 PM)  O2 Flow:  Room air CC: 1 Month Follow up Is Patient Diabetic? No Pain Assessment Patient in pain? no      Comments discuss sleep study   Primary Care Kasiah Manka:  DThomos Lemons DO  CC:  1 Month Follow up.  History of Present Illness: 45 y/o with chronic fatigue for follow up sleep study showed mild OSA significant parasomnia  she is frustrated she gained additional wt reviewed current diet  Preventive Screening-Counseling & Management  Alcohol-Tobacco     Smoking Status: current  Allergies: 1)  ! Pcn  Past History:  Past Medical History: History of depression Tobacco use Hx of plantar fascitis      Barrett's Esophagus       COLONIC POLYPS, HYPERPLASTIC, HX OF (ICD-V12.72) GASTRITIS, HX OF (ICD-V12.79) INTERNAL HEMORRHOIDS (ICD-455.0) Family Hx of COLON CANCER (ICD-153.9)  IRRITABLE BOWEL SYNDROME (ICD-564.1) BARRETTS ESOPHAGUS (ICD-530.85) GRIEF REACTION (ICD-309.0) HEADACHE, CHRONIC (ICD-784.0) SUNBURN (ICD-692.71) HIDRADENITIS SUPPURATIVA (ICD-705.83) BRONCHITIS (ICD-490) CHEST PAIN, ATYPICAL (ICD-786.59) TOBACCO ABUSE (ICD-305.1) Hx of PLANTAR FASCIITIS (ICD-728.71) DEPRESSION, HX OF (ICD-V11.8)  Past Surgical History: Wrist Surgey   Cesarean section x 2   Cholecystectomy   Tubal ligation   Surgery for plantar fascitis-right foot   Social History: Occupation: Metallurgist Divorced  Alcohol use-yes -x 5 per weekend Current Smoker < 1 ppd        Physical Exam  General:  alert, well-developed, and well-nourished.   Lungs:  normal  respiratory effort and normal breath sounds.   Heart:  normal rate, regular rhythm, and no gallop.   Extremities:  No lower extremity edema  Neurologic:  cranial nerves II-XII intact and gait normal.     Impression & Recommendations:  Problem # 1:  PARASOMNIA (ICD-780.59) poor response to mirapex.  trial of clonazepam  Problem # 2:  OBESITY (ICD-278.00) Pt counseled on diet and exercise. pt still drinking > 6pk of Dr. Reino Kent daily  Ht: 66 (11/20/2009)   Wt: 218 (11/20/2009)   BMI: 35.31 (11/20/2009)  Complete Medication List: 1)  Omeprazole 40 Mg Cpdr (Omeprazole) .... Take 1 capsule by mouth two times a day 2)  Venlafaxine Hcl 150 Mg Xr24h-cap (Venlafaxine hcl) .... Take 1 capsule by mouth once a day 3)  Lidocaine Viscous 2 % Soln (Lidocaine hcl) .... Take 1 tablespoon (15 ml) by mouth every 4 hours as needed for esophageal pain 4)  Clonazepam 0.5 Mg Tabs (Clonazepam) .... 1/2 to one tab at bedtime as needed  Other Orders: Tdap => 25yrs IM (29528) Admin 1st Vaccine (41324) Flu Vaccine 48yrs + (40102) Admin of Any Addtl Vaccine (72536)  Patient Instructions: 1)  Please schedule a follow-up appointment in 2 months. Prescriptions: CLONAZEPAM 0.5 MG TABS (CLONAZEPAM) 1/2 to one tab at bedtime as needed  #30 x 2   Entered and Authorized by:   D. Thomos Lemons DO   Signed  by:   Dondra Spry DO on 11/20/2009   Method used:   Print then Give to Patient   RxID:   [id number]   Current Allergies (reviewed today): ! PCN   Immunizations Administered:  Tetanus Vaccine:    Vaccine Type: Tdap    Site: left deltoid    Mfr: GlaxoSmithKline    Dose: 0.5 ml    Route: IM    Given by: Glendell Docker CMA    Exp. Date: 12/12/2011    Lot #: [id number]    VIS given: 01/10/08 version given November 20, 2009.  Influenza Vaccine # 1:    Vaccine Type: Fluvax 3+    Site: right deltoid    Mfr: GlaxoSmithKline    Dose: 0.5 ml    Route: IM    Given by: Glendell Docker CMA    Exp.  Date: 08/22/2010    Lot #: [id number]    VIS given: 09/16/09 version given November 20, 2009.  Flu Vaccine Consent Questions:    Do you have a history of severe allergic reactions to this vaccine? no    Any prior history of allergic reactions to egg and/or gelatin? no    Do you have a sensitivity to the preservative Thimersol? no    Do you have a past history of Guillan-Barre Syndrome? no    Do you currently have an acute febrile illness? no    Have you ever had a severe reaction to latex? no    Vaccine information given and explained to patient? yes    Are you currently pregnant? no

## 2010-03-26 NOTE — Medication Information (Signed)
Summary: Approved Omeprazole til 02/20/10/[employer] [place]  Approved Omeprazole til 02/20/10/[employer] [place]   Imported By: Lester Bowie 02/27/2009 09:16:54  _____________________________________________________________________  External Attachment:    Type:   Image     Comment:   External Document

## 2010-03-26 NOTE — Progress Notes (Signed)
Summary: PRIOR AUTH Venlafaxine  Phone Note Call from Patient Call back at Home Phone [phone]   Caller: PATIENT  Call For: [name] White  Summary of Call: [employer] SAYS THEY SENT THE PRIOR AUTH FORM BACK FOR ADDITIONAL INFORMATION ON 3-25.  PLEASE SEE IF YOU HAVE THAT FAX AND GET IT SENT IN FOR THE PATIENT AS SHE HAS NOT BEEN ABLE TO GET HER MEDS.   Initial call taken by: Roselle Locus,  May 26, 2009 12:28 PM  Follow-up for Phone Call        call returned to patient, she was informed that additional information request had not been recieved, however the first form would be re-faxed and insurnace company would be called for clarification for information that is needed.   Call was placed to [facility]  regarding status of medication, CSR Alinda Money stated diagnosis was still missing. Form was printed from file and diagnosis was addied and refaxed to [facility]. Awaiting status of approval. Follow-up by: Glendell Docker CMA,  May 26, 2009 3:22 PM  Additional Follow-up for Phone Call Additional follow up Details #1::        call returned to patient, patient informed approved.  Quantity limit to no more than 2 capsules per day approved from 05/15/2009 through 05/16/2010 Additional Follow-up by: Glendell Docker CMA,  May 27, 2009 8:27 AM

## 2010-03-26 NOTE — Letter (Signed)
Summary: EGD Instructions  [facility]  [address] [place], [place] [address]   Phone: [phone]  Fax: [phone]       [name] White    1965/08/30    MRN: [id number]       Procedure Day Dorna Bloom:  [name]  08/07/09     Arrival Time:  12:30pm     Procedure Time: 1:30pm     Location of Procedure:                    _X  _ [facility] (4th Floor)    PREPARATION FOR ENDOSCOPY   On  THURSDAY 08/07/09, THE DAY OF THE PROCEDURE:  1.   No solid foods, milk or milk products are allowed after midnight the night before your procedure.  2.   Do not drink anything colored red or purple.  Avoid juices with pulp.  No orange juice.  3.  You may drink clear liquids until  11:30am , which is 2 hours before your procedure.                                                                                                CLEAR LIQUIDS INCLUDE: Water Jello Ice Popsicles Tea (sugar ok, no milk/cream) Powdered fruit flavored drinks Coffee (sugar ok, no milk/cream) Gatorade Juice: apple, white grape, white cranberry  Lemonade Clear bullion, consomm, broth Carbonated beverages (any kind) Strained chicken noodle soup Hard Candy   MEDICATION INSTRUCTIONS  Unless otherwise instructed, you should take regular prescription medications with a small sip of water as early as possible the morning of your procedure.                OTHER INSTRUCTIONS  You will need a responsible adult at least 46 years of age to accompany you and drive you home.   This person must remain in the waiting room during your procedure.  Wear loose fitting clothing that is easily removed.  Leave jewelry and other valuables at home.  However, you may wish to bring a book to read or an iPod/MP3 player to listen to music as you wait for your procedure to start.  Remove all body piercing jewelry and leave at home.  Total time from sign-in until discharge is approximately 2-3  hours.  You should go home directly after your procedure and rest.  You can resume normal activities the day after your procedure.  The day of your procedure you should not:   Drive   Make legal decisions   Operate machinery   Drink alcohol   Return to work  You will receive specific instructions about eating, activities and medications before you leave.    The above instructions have been reviewed and explained to me by   [name] Almas RN  July 24, 2009 10:25 AM    I fully understand and can verbalize these instructions _____________________________ Date _________

## 2010-03-26 NOTE — Progress Notes (Signed)
Summary: Xanax Refill  Phone Note Refill Request Message from:  Fax from Pharmacy on September 01, 2009 3:22 PM  Refills Requested: Medication #1:  XANAX 0.25 MG TABS Take 1 tablet by mouth once a day as needed.   Dosage confirmed as above?Dosage Confirmed   Brand Name Necessary? No   Supply Requested: 1 month   Last Refilled: 08/04/2009  Method Requested: Telephone to Pharmacy Next Appointment Scheduled: 10/06/2009 @ 3p Dr Artist Pais Initial call taken by: Glendell Docker CMA,  September 01, 2009 3:22 PM  Follow-up for Phone Call        ok to refill x 2 Follow-up by: D. Thomos Lemons DO,  September 02, 2009 1:41 PM    Prescriptions: XANAX 0.25 MG TABS (ALPRAZOLAM) Take 1 tablet by mouth once a day as needed  #30 x 1   Entered by:   Mervin Kung CMA (AAMA)   Authorized by:   D. Thomos Lemons DO   Signed by:   Mervin Kung CMA (AAMA) on 09/02/2009   Method used:   Telephoned to ...       [facility]  [address] [id number]       [address]       [place], [place]  [address]       Ph: [phone] or [phone]       Fax: [phone]   RxID:   [id number]

## 2010-03-26 NOTE — Progress Notes (Signed)
Summary: Lidocaine switched to Hydrocone?  Medications Added LIDOCAINE VISCOUS 2 % SOLN (LIDOCAINE HCL) Take 1 tablespoon (15 ml) by mouth every 4 hours as needed for esophageal pain       Phone Note Call from Patient Call back at Home Phone [phone]   Call For: Dr Juanda Chance Reason for Call: Talk to Nurse Summary of Call: Wonders why her Lidocaidne was switched to Hydrocodone. Initial call taken by: Leanor Kail [facility],  September 08, 2009 9:22 AM  Follow-up for Phone Call        We did not send Hydrocodone. (I spoke with the pharmacy who says patient is mistaken, this was rxed by another physician, not Dr Juanda Chance). We denied the lidocaine because I wanted to get in touch with patient to see why she needed it (she has not gotten this in a while). Patient states that she takes the lidocaine when she is having esophageal discomfort that omeprazole does not help. She has a hx of barretts, but this was not seen on biopsies from most recent endoscopy. She c/o chest pain but denies any other symptoms.  Dr Juanda Chance- Are you okay with me giving her prescription of lidocaine? Follow-up by: Lamona Curl CMA Duncan Dull),  September 08, 2009 10:05 AM  Additional Follow-up for Phone Call Additional follow up Details #1::        Yes , it is OK, Disp 8 oz, 1 tablespoon, by mouth q 4 hrs as needed esophageal pain, 1 refill Additional Follow-up by: Hart Carwin MD,  September 08, 2009 1:57 PM    Additional Follow-up for Phone Call Additional follow up Details #2::    Prescription sent to pharmacy. Patient advised. Follow-up by: Lamona Curl CMA (AAMA),  September 08, 2009 2:14 PM  New/Updated Medications: LIDOCAINE VISCOUS 2 % SOLN (LIDOCAINE HCL) Take 1 tablespoon (15 ml) by mouth every 4 hours as needed for esophageal pain Prescriptions: LIDOCAINE VISCOUS 2 % SOLN (LIDOCAINE HCL) Take 1 tablespoon (15 ml) by mouth every 4 hours as needed for esophageal pain  #8 ounces x 1   Entered by:   Lamona Curl  CMA (AAMA)   Authorized by:   Hart Carwin MD   Signed by:   Lamona Curl CMA (AAMA) on 09/08/2009   Method used:   [facility]        [facility]  [address] [id number]       [address]       [place], [place]  [address]       Ph: [phone] or [phone]       Fax: [phone]   RxID:   [id number]

## 2010-03-26 NOTE — Progress Notes (Signed)
Summary: Triage call  Phone Note Call from Patient Call back at Home Phone [phone]   Caller: Patient Summary of Call: pt. was seen last Mon. by Dr.Talena Neira and states that she has had 2 Rx. and is not feeling any better. Pt. was diagnosed with Bronchitis and is now is wheezing on & off. What should patient do? Call her back at [phone] Initial call taken by: Michaelle Copas,  May 12, 2009 9:37 AM  Follow-up for Phone Call        needs OV.  please have cxr done before OV Follow-up by: D. Thomos Lemons DO,  May 12, 2009 10:11 AM  Additional Follow-up for Phone Call Additional follow up Details #1::        Appt   March 21     cxr before appt Additional Follow-up by: Darral Dash,  May 12, 2009 11:50 AM

## 2010-03-26 NOTE — Progress Notes (Signed)
Summary: Sleep Study Results  Phone Note Call from Patient Call back at Home Phone [phone]   Caller: Patient Call For: D. Thomos Lemons DO Reason for Call: Lab or Test Results Summary of Call: Patient called and left voice message requesting the results of her sleep study.   Copy printed and forwarded to Dr Artist Pais for review Initial call taken by: Glendell Docker CMA,  October 13, 2009 3:10 PM  Follow-up for Phone Call        call pt - sleep study showed mild OSA.  rec treatment is wt loss and possible use of oral appliance. sleep study also showed large number of leg jerks suggestive of RLS.   I suggest trial of mirapex.  see rx.   I suggest f/u visit within one month of starting mirapex Follow-up by: D. Thomos Lemons DO,  October 13, 2009 5:33 PM  Additional Follow-up for Phone Call Additional follow up Details #1::        call placed to patient at [phone], she was advised per Dr Artist Pais instructions. She states she does not have any questions at this time Additional Follow-up by: Glendell Docker CMA,  October 14, 2009 9:16 AM    New/Updated Medications: PRAMIPEXOLE DIHYDROCHLORIDE 0.25 MG TABS (PRAMIPEXOLE DIHYDROCHLORIDE) 1/2 tab by mouth at bedtime x 7 days, then one by mouth once daily Prescriptions: PRAMIPEXOLE DIHYDROCHLORIDE 0.25 MG TABS (PRAMIPEXOLE DIHYDROCHLORIDE) 1/2 tab by mouth at bedtime x 7 days, then one by mouth once daily  #30 x 1   Entered and Authorized by:   D. Thomos Lemons DO   Signed by:   D. Thomos Lemons DO on 10/13/2009   Method used:   [facility]  [address] [id number]       [address]       [place], [place]  [address]       Ph: [phone] or [phone]       Fax: [phone]   RxID:   [id number]

## 2010-03-26 NOTE — Assessment & Plan Note (Signed)
Summary: 6 MONTH FOLLOW UP/MHF   Vital Signs:  Patient profile:   45 year old female Height:      66 inches Weight:      210.25 pounds BMI:     34.06 O2 Sat:      98 % on Room air Temp:     98.4 degrees F oral Pulse rate:   68 / minute Pulse rhythm:   regular Resp:     18 per minute BP sitting:   110 / 70  (right arm) Cuff size:   large  Vitals Entered By: Glendell Docker CMA (May 05, 2009 3:17 PM)  O2 Flow:  Room air CC: Rm 2-6 Month follow up disease management   Primary Care Provider:  DThomos Lemons DO  CC:  Rm 2-6 Month follow up disease management.  History of Present Illness: 45y/o white female (smoker) c/o cough - productive (green).   no sob.  no wheezing. onset Sunday,  fatigue, denies temp-have not checked, Nyquil taken with no relief  grief rxn - better.  less crying spells.  wt gain since starting citalopram  labs reviewed - LDL elevated  Preventive Screening-Counseling & Management  Alcohol-Tobacco     Smoking Status: current  Allergies: 1)  ! Pcn  Past History:  Past Medical History: History of depression Tobacco use Hx of plantar fascitis    Barrett's Esophagus    COLONIC POLYPS, HYPERPLASTIC, HX OF (ICD-V12.72) GASTRITIS, HX OF (ICD-V12.79) INTERNAL HEMORRHOIDS (ICD-455.0) Family Hx of COLON CANCER (ICD-153.9) IRRITABLE BOWEL SYNDROME (ICD-564.1) BARRETTS ESOPHAGUS (ICD-530.85) GRIEF REACTION (ICD-309.0) HEADACHE, CHRONIC (ICD-784.0) SUNBURN (ICD-692.71) HIDRADENITIS SUPPURATIVA (ICD-705.83) BRONCHITIS (ICD-490) CHEST PAIN, ATYPICAL (ICD-786.59) TOBACCO ABUSE (ICD-305.1) Hx of PLANTAR FASCIITIS (ICD-728.71) DEPRESSION, HX OF (ICD-V11.8)  Social History: Occupation: Metallurgist Divorced  Alcohol use-yes -x 5 per weekend Current Smoker < 1 ppd  Physical Exam  General:  alert and overweight-appearing.   Ears:  R ear normal and L ear normal.   Mouth:  no exudates and pharyngeal erythema.   Neck:  supple and no masses.     Lungs:  normal respiratory effort and faint coarse breath sounds bilaterally Heart:  normal rate, regular rhythm, and no gallop.   Psych:  normally interactive and good eye contact.     Impression & Recommendations:  Problem # 1:  BRONCHITIS-ACUTE (ICD-466.0) 45 y/o white female smoker with productive cough.  Take abx as directed.  Pt urged to quit smoking  Her updated medication list for this problem includes:    Azithromycin 250 Mg Tabs (Azithromycin) .Marland Kitchen... 2 tabs on day one, then one by mouth qd    Hydrocodone-homatropine 5-1.5 Mg/34ml Syrp (Hydrocodone-homatropine) .Marland KitchenMarland KitchenMarland KitchenMarland Kitchen 5 ml by mouth at bedtime prn  Problem # 2:  GRIEF REACTION (ICD-309.0) Assessment: Improved Pt feeling better but noticed 8 lbs wt gain.  pt has been struggling with mood issues since she was teenager.  I suggest with transition to effexor.  Problem # 3:  HYPERLIPIDEMIA (ICD-272.4) Pt wants to avoid statin.  Pt counseled on dietary change. Labs Reviewed: SGOT: 18 (04/28/2009)   SGPT: 19 (04/28/2009)   HDL:51.10 (04/28/2009)  Chol:210 (04/28/2009)  Trig:100.0 (04/28/2009)  Complete Medication List: 1)  Omeprazole 40 Mg Cpdr (Omeprazole) .... Take 1 capsule by mouth two times a day 2)  Azithromycin 250 Mg Tabs (Azithromycin) .... 2 tabs on day one, then one by mouth qd 3)  Hydrocodone-homatropine 5-1.5 Mg/54ml Syrp (Hydrocodone-homatropine) .... 5 ml by mouth at bedtime prn 4)  Fluoxetine Hcl 10 Mg Tabs (  Fluoxetine hcl) .... One tab by mouth once daily x 2 weeks, then 1/2 by mouth once daily x 2 wks then stop 5)  Venlafaxine Hcl 37.5 Mg Xr24h-cap (Venlafaxine hcl) .... One by mouth once daily x 2 weeks, then two by mouth once daily  Patient Instructions: 1)  Please schedule a follow-up appointment in 6 weeks. 2)  Call our office if your symptoms do not  improve or gets worse. Prescriptions: FLUOXETINE HCL 10 MG TABS (FLUOXETINE HCL) one tab by mouth once daily x 2 weeks, then 1/2 by mouth once daily x 2 wks then  stop  #30 x 1   Entered and Authorized by:   D. Thomos Lemons DO   Signed by:   D. Thomos Lemons DO on 05/05/2009   Method used:   [facility]        [facility]  [address] [id number]       [address]       [place], [place]  [address]       Ph: [phone] or [phone]       Fax: [phone]   RxID:   [id number] VENLAFAXINE HCL 37.5 MG XR24H-CAP (VENLAFAXINE HCL) one by mouth once daily x 2 weeks, then two by mouth once daily  #60 x 1   Entered and Authorized by:   D. Thomos Lemons DO   Signed by:   D. Thomos Lemons DO on 05/05/2009   Method used:   [facility]        [facility]  [id number]       [address]       [place], [place]  [address]       Ph: [phone] or [phone]       Fax: [phone]   RxID:   [id number] FLUOXETINE HCL 10 MG CAPS (FLUOXETINE HCL) one tab by mouth once daily x 2 weeks, then 1/2 by mouth once daily x 2 wks then stop  #30 x 1   Entered and Authorized by:   D. Thomos Lemons DO   Signed by:   D. Thomos Lemons DO on 05/05/2009   Method used:   [facility]ically [facility]        [facility]  [id number]       [address]       [place], [place]  [address]       Ph: [phone] or [phone]       Fax: [phone]   RxID:   [id number] HYDROCODONE-HOMATROPINE 5-1.5 MG/5ML SYRP (HYDROCODONE-HOMATROPINE) 5 ml by mouth at bedtime prn  #60 x 0   Entered and Authorized by:   D. Thomos Lemons DO   Signed by:   D. Thomos Lemons DO on 05/05/2009   Method used:   Print then Give to Patient   RxID:   [id number] AZITHROMYCIN 250 MG TABS (AZITHROMYCIN) 2 tabs on day one, then one by mouth qd  #6 x 0   Entered and Authorized by:   D. Thomos Lemons DO   Signed by:   D. Thomos Lemons DO on 05/05/2009   Method used:   [facility]ically to        [facility]  [address] [id number]       [address]       [place], [place]  [address]  Ph: [phone] or  [phone]       Fax: [phone]   RxID:   [id number]   Current Allergies (reviewed today): ! PCN

## 2010-03-26 NOTE — Progress Notes (Signed)
Summary: Blood Work  ---- [facility] ---- ---- 01/20/2010 4:45 PM, D. Thomos Lemons DO wrote: plz call pt in AM and have pt return for blood work CBC no Diff prior to visit, ICD-9: 333.94 3)  serum iron, TIBC, ferritin - 333.94  sometimes,  iron def can exacerbate restless leg syndrome ------------------------------  Phone Note Outgoing Call   Call placed by: Glendell Docker CMA,  January 21, 2010 8:17 AM Call placed to: Patient Summary of Call: call placed to patient at [phone], no answer. A detailed voice message was left for patient to return for  non-fasting blood work. She was advised to call back if any questions. Initial call taken by: Glendell Docker CMA,  January 21, 2010 8:19 AM

## 2010-03-26 NOTE — Assessment & Plan Note (Signed)
Summary: Bronchitis    x-ray order  /hea   Vital Signs:  Patient profile:   45 year old female Weight:      206.75 pounds BMI:     33.49 O2 Sat:      99 % on Room air Temp:     98.0 degrees F oral Pulse rate:   63 / minute Pulse rhythm:   regular Resp:     20 per minute BP sitting:   100 / 70  (left arm) Cuff size:   large  Vitals Entered By: Glendell Docker CMA (May 12, 2009 3:18 PM)  O2 Flow:  Room air CC: Rm 3- Unresolved Bronchitis   Primary Care Provider:  Dondra Spry DO  CC:  Rm 3- Unresolved Bronchitis.  History of Present Illness: 45 y/o white female c/o unresolved fatigue, cough-dry , wheezing increased, nasal drainage  out of work since last week Monday had fever 3/15  - now resolved ceftin added to z pak no sob CXR - negative for pna  Allergies: 1)  ! Pcn  Past History:  Past Medical History: History of depression Tobacco use Hx of plantar fascitis     Barrett's Esophagus    COLONIC POLYPS, HYPERPLASTIC, HX OF (ICD-V12.72) GASTRITIS, HX OF (ICD-V12.79) INTERNAL HEMORRHOIDS (ICD-455.0) Family Hx of COLON CANCER (ICD-153.9) IRRITABLE BOWEL SYNDROME (ICD-564.1) BARRETTS ESOPHAGUS (ICD-530.85) GRIEF REACTION (ICD-309.0) HEADACHE, CHRONIC (ICD-784.0) SUNBURN (ICD-692.71) HIDRADENITIS SUPPURATIVA (ICD-705.83) BRONCHITIS (ICD-490) CHEST PAIN, ATYPICAL (ICD-786.59) TOBACCO ABUSE (ICD-305.1) Hx of PLANTAR FASCIITIS (ICD-728.71) DEPRESSION, HX OF (ICD-V11.8)  Past Surgical History: Wrist Surgey   Cesarean section x 2 Cholecystectomy  Tubal ligation  Surgery for plantar fascitis-right foot   Family History:  Mother alive at age 66 has chronic obstructive pulmonary disease, coronary artery disease, a history of stroke and also reflex sympathetic dystrophy.   She is status post pacemaker.  Father is age 66 with hypertension.  She is estranged from her father.  In her grandparents there is a family history of colon cancer.  Family History of  Colon Cancer: Maternal Grandfather-diagnosed in his 54's. Family History of Colon Polyps: Maternal grandfather, Mother, Brother x 2 Family History of Breast Cancer: Mother     Social History: Occupation: Metallurgist Divorced  Alcohol use-yes -x 5 per weekend Current Smoker < 1 ppd    Physical Exam  General:  alert, well-developed, and well-nourished.   Lungs:  normal respiratory effort and normal breath sounds.   Heart:  normal rate, regular rhythm, and no gallop.   Extremities:  No lower extremity edema    Impression & Recommendations:  Problem # 1:  BRONCHITIS-ACUTE (ICD-466.0) Pt with persistent cough.   chest is clear.  no fever.  use prednisone taper for airway inflammation.  The following medications were removed from the medication list:    Azithromycin 250 Mg Tabs (Azithromycin) .Marland Kitchen... 2 tabs on day one, then one by mouth qd    Cefuroxime Axetil 500 Mg Tabs (Cefuroxime axetil) ..... One by mouth bid Her updated medication list for this problem includes:    Hydrocodone-homatropine 5-1.5 Mg/17ml Syrp (Hydrocodone-homatropine) .Marland KitchenMarland KitchenMarland KitchenMarland Kitchen 5 ml by mouth at bedtime prn  Complete Medication List: 1)  Omeprazole 40 Mg Cpdr (Omeprazole) .... Take 1 capsule by mouth two times a day 2)  Hydrocodone-homatropine 5-1.5 Mg/18ml Syrp (Hydrocodone-homatropine) .... 5 ml by mouth at bedtime prn 3)  Fluoxetine Hcl 10 Mg Tabs (Fluoxetine hcl) .... One tab by mouth once daily x 2 weeks, then 1/2 by mouth once daily x  2 wks then stop 4)  Venlafaxine Hcl 37.5 Mg Xr24h-cap (Venlafaxine hcl) .... One by mouth once daily x 2 weeks, then two by mouth once daily 5)  Prednisone 10 Mg Tabs (Prednisone) .... 3 tabs by mouth once daily x 3 days, 2 tabs by mouth once daily x 3 days, 1 tab by mouth once daily x 3 days  Patient Instructions: 1)  Take prednisone as directed. 2)  Gargle with warm salt water once daily 3)  Call our office if your symptoms do not  improve or gets  worse. Prescriptions: PREDNISONE 10 MG TABS (PREDNISONE) 3 tabs by mouth once daily x 3 days, 2 tabs by mouth once daily x 3 days, 1 tab by mouth once daily x 3 days  #18 x 0   Entered and Authorized by:   D. Thomos Lemons DO   Signed by:   D. Thomos Lemons DO on 05/12/2009   Method used:   [facility]  [address] [id number]       [address]       [place], [place]  [address]       Ph: [phone] or [phone]       Fax: [phone]   RxID:   [id number]   Current Allergies (reviewed today): ! PCN

## 2010-03-26 NOTE — Procedures (Signed)
Summary: Upper Endoscopy  Patient: [name] White Note: All result statuses are Final unless otherwise noted.  Tests: (1) Upper Endoscopy (EGD)   EGD Upper Endoscopy       DONE     [facility]     [address]     [place], [place]  [address]           ENDOSCOPY PROCEDURE REPORT           PATIENT:  White, [name]  MR#:  [id number]     BIRTHDATE:  1966/01/20, 43 yrs. old  GENDER:  female           ENDOSCOPIST:  Hedwig Morton. Juanda Chance, MD     Referred by:           PROCEDURE DATE:  08/07/2009     PROCEDURE:  EGD with biopsy     ASA CLASS:  Class II     INDICATIONS:  h/o Barrett's Esophagus, chest pain, GERD Barrett's     esophagus on EGD 08/2007           MEDICATIONS:   Versed 7 mg, Fentanyl 50 mcg     TOPICAL ANESTHETIC:  Exactacain Spray           DESCRIPTION OF PROCEDURE:   After the risks benefits and     alternatives of the procedure were thoroughly explained, informed     consent was obtained.  The [facility] [id number] endoscope was     introduced through the mouth and advanced to the second portion of     the duodenum, without limitations.  The instrument was slowly     withdrawn as the mucosa was fully examined.     <<PROCEDUREIMAGES>>           irregular Z-line. small 1 cm hiatal hernia With standard forceps,     a biopsy was obtained and sent to pathology (see image1, image2,     image3, and image7).  Otherwise the examination was normal (see     image4, image5, and image6).    Retroflexed views revealed no     abnormalities.    The scope was then withdrawn from the patient     and the procedure completed.           COMPLICATIONS:  None           ENDOSCOPIC IMPRESSION:     1) Irregular Z-line     2) Otherwise normal examination     s/o Biopsies     RECOMMENDATIONS:     1) Await pathology results     continue Nexiem 40 mg po bid           REPEAT EXAM:  In 2 year(s) for.           ______________________________     Hedwig Morton. Juanda Chance, MD           CC:  Thomos Lemons, DO           n.     eSIGNED:   Hedwig Morton. Brodie at 08/07/2009 01:51 PM           [name] White, [name] White, [name] White [id number]  Note: An exclamation mark (!) indicates a result that was not dispersed into the flowsheet. Document Creation Date: 08/07/2009 1:52 PM _______________________________________________________________________  (1) Order result status: Final Collection or observation date-time: 08/07/2009 13:44 Requested date-time:  Receipt date-time:  Reported date-time:  Referring Physician:   Ordering Physician: [name] [phone]) Specimen Source:  Source: Kem Parkinson  Filler Order Number: [id number] Lab site:

## 2010-03-30 ENCOUNTER — Telehealth: Payer: Self-pay | Admitting: Internal Medicine

## 2010-04-02 ENCOUNTER — Telehealth: Payer: Self-pay | Admitting: Internal Medicine

## 2010-04-06 ENCOUNTER — Ambulatory Visit: Payer: Self-pay | Admitting: Internal Medicine

## 2010-04-09 ENCOUNTER — Encounter: Payer: Self-pay | Admitting: Internal Medicine

## 2010-04-09 ENCOUNTER — Ambulatory Visit (INDEPENDENT_AMBULATORY_CARE_PROVIDER_SITE_OTHER): Payer: BC Managed Care – PPO | Admitting: Internal Medicine

## 2010-04-09 DIAGNOSIS — G2581 Restless legs syndrome: Secondary | ICD-10-CM

## 2010-04-09 DIAGNOSIS — F4320 Adjustment disorder, unspecified: Secondary | ICD-10-CM

## 2010-04-09 NOTE — Progress Notes (Signed)
Summary: Alprazolam Refill  Phone Note Call from Patient Call back at Home Phone [phone]   Caller: Patient Call For: D. Thomos Lemons DO Summary of Call: patient called and left voice message regarding her rx for Alprazolam. She states she is requesting the refill to help with her nerves. She had a die Saturday and a half brother die on Sunday. She is also stating she was scheduled for Monday with Dr Jhayden Demuro, but needed to cancel that appointment, because she is scheduled to be somewhere else Initial call taken by: Darlene Knight CMA,  April 02, 2010 10:35 AM  Follow-up for Phone Call        ok for 1 refill of alprazolam. OV needed if additional refills needed Follow-up by: D. Robert Cleopatra Sardo DO,  April 02, 2010 1:15 PM  Additional Follow-up for Phone Call Additional follow up Details #1::        call returned to patient at [phone], she has been advised per Dr Eara Burruel instructions Additional Follow-up by: Darlene Knight CMA,  April 02, 2010 2:02 PM    New/Updated Medications: ALPRAZOLAM 0.25 MG TABS (ALPRAZOLAM) Take 1 tablet by mouth once a day Prescriptions: ALPRAZOLAM 0.25 MG TABS (ALPRAZOLAM) Take 1 tablet by mouth once a day  #30 x 0   Entered by:   Darlene Knight CMA   Authorized by:   D. Robert Batsheva Stevick DO   Signed by:   Darlene Knight CMA on 04/02/2010   Method used:   [facility]ph[facility] to ...       [facility]  [address] [id number]       [address]       [place], [place]  [address]       Ph: [phone] or [phone]       Fax: [phone]   RxID:   [id number]

## 2010-04-09 NOTE — Progress Notes (Signed)
Summary: Alprazolam Refill  Phone Note Refill Request Message from:  Fax from Pharmacy on March 30, 2010 9:23 AM  Refills Requested: Medication #1:  alprazolam .25 mg   Dosage confirmed as above?Dosage Confirmed   Brand Name Necessary? No   Supply Requested: 1 month   [facility] [address] [place], fax [phone]   Method Requested: Electronic Next Appointment Scheduled: 02.13.12 yoo Initial call taken by: Elba Barman,  March 30, 2010 9:26 AM  Follow-up for Phone Call        why is pt requesting refill Follow-up by: D. Thomos Lemons DO,  March 31, 2010 1:05 PM  Additional Follow-up for Phone Call Additional follow up Details #1::        call placed to patient at [phone], no answer. A detailed voice message was left for patient to return call regarding need for Alprazolam refill  Additional Follow-up by: Glendell Docker CMA,  March 31, 2010 3:36 PM    Additional Follow-up for Phone Call Additional follow up Details #2::    no return call from patient regarding medication refill Follow-up by: Glendell Docker CMA,  April 02, 2010 10:07 AM

## 2010-04-17 ENCOUNTER — Encounter (HOSPITAL_COMMUNITY)
Admission: RE | Admit: 2010-04-17 | Discharge: 2010-04-17 | Disposition: A | Discharge status: Home / Self Care | Payer: BC Managed Care – PPO | Source: Ambulatory Visit | Attending: Obstetrics and Gynecology | Admitting: Obstetrics and Gynecology

## 2010-04-17 LAB — CBC
HCT: 39.8 % (ref 36.0–46.0)
Hemoglobin: 12.8 g/dL (ref 12.0–15.0)
MCHC: 32.2 g/dL (ref 30.0–36.0)
MCV: 97.3 fL (ref 78.0–100.0)
Platelets: 179 10*3/uL (ref 150–400)
RBC: 4.09 MIL/uL (ref 3.87–5.11)
RDW: 13.3 % (ref 11.5–15.5)
WBC: 5.8 10*3/uL (ref 4.0–10.5)

## 2010-04-17 LAB — SURGICAL PCR SCREEN: Staphylococcus aureus: NEGATIVE

## 2010-04-22 ENCOUNTER — Inpatient Hospital Stay (HOSPITAL_COMMUNITY): Payer: BC Managed Care – PPO

## 2010-04-22 ENCOUNTER — Ambulatory Visit (HOSPITAL_COMMUNITY)
Admission: RE | Admit: 2010-04-22 | Discharge: 2010-04-22 | Disposition: A | Discharge status: Home / Self Care | Payer: BC Managed Care – PPO | Source: Ambulatory Visit | Attending: Obstetrics and Gynecology | Admitting: Obstetrics and Gynecology

## 2010-04-22 ENCOUNTER — Observation Stay (HOSPITAL_COMMUNITY)
Admission: AD | Admit: 2010-04-22 | Discharge: 2010-04-23 | Disposition: A | Discharge status: Home / Self Care | Payer: BC Managed Care – PPO | Source: Ambulatory Visit | Attending: Obstetrics and Gynecology | Admitting: Obstetrics and Gynecology

## 2010-04-22 ENCOUNTER — Other Ambulatory Visit: Payer: Self-pay | Admitting: Obstetrics and Gynecology

## 2010-04-22 DIAGNOSIS — Z01818 Encounter for other preprocedural examination: Secondary | ICD-10-CM | POA: Insufficient documentation

## 2010-04-22 DIAGNOSIS — N736 Female pelvic peritoneal adhesions (postinfective): Secondary | ICD-10-CM | POA: Insufficient documentation

## 2010-04-22 DIAGNOSIS — N7013 Chronic salpingitis and oophoritis: Secondary | ICD-10-CM | POA: Insufficient documentation

## 2010-04-22 DIAGNOSIS — IMO0002 Reserved for concepts with insufficient information to code with codable children: Secondary | ICD-10-CM | POA: Insufficient documentation

## 2010-04-22 DIAGNOSIS — R1031 Right lower quadrant pain: Secondary | ICD-10-CM | POA: Insufficient documentation

## 2010-04-22 DIAGNOSIS — Z01812 Encounter for preprocedural laboratory examination: Secondary | ICD-10-CM | POA: Insufficient documentation

## 2010-04-22 HISTORY — PX: LAPAROSCOPIC LYSIS OF ADHESIONS: SHX5905

## 2010-04-22 HISTORY — PX: LAPAROSCOPY: SHX197

## 2010-04-22 HISTORY — PX: SALPINGECTOMY: SHX328

## 2010-04-22 LAB — COMPREHENSIVE METABOLIC PANEL
AST: 19 U/L (ref 0–37)
BUN: 12 mg/dL (ref 6–23)
Chloride: 102 mEq/L (ref 96–112)
Creatinine, Ser: 1.07 mg/dL (ref 0.4–1.2)
Sodium: 134 mEq/L — ABNORMAL LOW (ref 135–145)
Total Bilirubin: 0.6 mg/dL (ref 0.3–1.2)
Total Protein: 6.9 g/dL (ref 6.0–8.3)

## 2010-04-22 LAB — CBC
Hemoglobin: 12.8 g/dL (ref 12.0–15.0)
MCH: 31.4 pg (ref 26.0–34.0)
MCHC: 32.4 g/dL (ref 30.0–36.0)
MCV: 97.1 fL (ref 78.0–100.0)
RBC: 4.07 MIL/uL (ref 3.87–5.11)
RDW: 13.4 % (ref 11.5–15.5)

## 2010-04-22 MED ORDER — IOHEXOL 300 MG/ML  SOLN
100.0000 mL | Freq: Once | INTRAMUSCULAR | Status: AC | PRN
  Administered 2010-04-22: 100 mL via INTRAVENOUS

## 2010-04-26 ENCOUNTER — Inpatient Hospital Stay (HOSPITAL_COMMUNITY): Payer: BC Managed Care – PPO

## 2010-04-26 ENCOUNTER — Observation Stay (HOSPITAL_COMMUNITY)
Admission: AD | Admit: 2010-04-26 | Discharge: 2010-04-29 | DRG: 188 | Disposition: A | Discharge status: Home / Self Care | Payer: BC Managed Care – PPO | Source: Ambulatory Visit | Attending: Obstetrics and Gynecology | Admitting: Obstetrics and Gynecology

## 2010-04-26 DIAGNOSIS — R109 Unspecified abdominal pain: Secondary | ICD-10-CM | POA: Diagnosis present

## 2010-04-26 DIAGNOSIS — Y838 Other surgical procedures as the cause of abnormal reaction of the patient, or of later complication, without mention of misadventure at the time of the procedure: Secondary | ICD-10-CM | POA: Diagnosis present

## 2010-04-26 DIAGNOSIS — K560 Paralytic ileus: Secondary | ICD-10-CM | POA: Diagnosis present

## 2010-04-26 DIAGNOSIS — K929 Disease of digestive system, unspecified: Principal | ICD-10-CM | POA: Diagnosis present

## 2010-04-26 LAB — CBC
MCH: 31.3 pg (ref 26.0–34.0)
MCHC: 33.8 g/dL (ref 30.0–36.0)
MCV: 92.6 fL (ref 78.0–100.0)
Platelets: 191 10*3/uL (ref 150–400)

## 2010-04-26 LAB — COMPREHENSIVE METABOLIC PANEL
ALT: 21 U/L (ref 0–35)
AST: 24 U/L (ref 0–37)
Albumin: 3.5 g/dL (ref 3.5–5.2)
Alkaline Phosphatase: 79 U/L (ref 39–117)
Chloride: 95 mEq/L — ABNORMAL LOW (ref 96–112)
GFR calc Af Amer: 21 mL/min — ABNORMAL LOW (ref >60)
GFR calc non Af Amer: 18 mL/min — ABNORMAL LOW (ref >60)
Potassium: 5.3 mEq/L — ABNORMAL HIGH (ref 3.5–5.1)
Sodium: 130 mEq/L — ABNORMAL LOW (ref 135–145)
Total Bilirubin: 0.8 mg/dL (ref 0.3–1.2)
Total Protein: 7.2 g/dL (ref 6.0–8.3)

## 2010-04-27 LAB — CBC
Hemoglobin: 12.7 g/dL (ref 12.0–15.0)
Hemoglobin: 12.2 g/dL (ref 12.0–15.0)
MCHC: 33.1 g/dL (ref 30.0–36.0)
Platelets: 200 10*3/uL (ref 150–400)
Platelets: 184 10*3/uL (ref 150–400)
RBC: 3.9 MIL/uL (ref 3.87–5.11)
RDW: 13.2 % (ref 11.5–15.5)

## 2010-04-27 LAB — BASIC METABOLIC PANEL
CO2: 30 mEq/L (ref 19–32)
Calcium: 8.8 mg/dL (ref 8.4–10.5)
Chloride: 99 mEq/L (ref 96–112)
Creatinine, Ser: 0.71 mg/dL (ref 0.4–1.2)
GFR calc Af Amer: >60 mL/min (ref >60)
Sodium: 135 mEq/L (ref 135–145)

## 2010-04-27 LAB — COMPREHENSIVE METABOLIC PANEL
ALT: 18 U/L (ref 0–35)
AST: 14 U/L (ref 0–37)
Albumin: 2.8 g/dL — ABNORMAL LOW (ref 3.5–5.2)
CO2: 26 mEq/L (ref 19–32)
Calcium: 8.9 mg/dL (ref 8.4–10.5)
GFR calc Af Amer: 32 mL/min — ABNORMAL LOW (ref >60)
Sodium: 135 mEq/L (ref 135–145)
Total Protein: 5.9 g/dL — ABNORMAL LOW (ref 6.0–8.3)

## 2010-04-27 LAB — URINALYSIS, ROUTINE W REFLEX MICROSCOPIC
Bilirubin Urine: NEGATIVE
Glucose, UA: NEGATIVE mg/dL
Ketones, ur: NEGATIVE mg/dL
Specific Gravity, Urine: 1.025 (ref 1.005–1.030)
pH: 5 (ref 5.0–8.0)

## 2010-04-27 LAB — URINE MICROSCOPIC-ADD ON

## 2010-04-27 LAB — DIFFERENTIAL
Basophils Absolute: 0 10*3/uL (ref 0.0–0.1)
Lymphocytes Relative: 28 % (ref 12–46)
Neutro Abs: 6.2 10*3/uL (ref 1.7–7.7)

## 2010-04-28 LAB — COMPREHENSIVE METABOLIC PANEL
ALT: 17 U/L (ref 0–35)
AST: 15 U/L (ref 0–37)
Albumin: 2.6 g/dL — ABNORMAL LOW (ref 3.5–5.2)
Alkaline Phosphatase: 54 U/L (ref 39–117)
CO2: 31 mEq/L (ref 19–32)
Chloride: 98 mEq/L (ref 96–112)
Creatinine, Ser: 0.84 mg/dL (ref 0.4–1.2)
GFR calc Af Amer: >60 mL/min (ref >60)
GFR calc non Af Amer: >60 mL/min (ref >60)
Potassium: 4.4 mEq/L (ref 3.5–5.1)
Total Bilirubin: 0.7 mg/dL (ref 0.3–1.2)

## 2010-04-28 LAB — CBC
Hemoglobin: 11.2 g/dL — ABNORMAL LOW (ref 12.0–15.0)
MCH: 30.9 pg (ref 26.0–34.0)
Platelets: 186 10*3/uL (ref 150–400)
RBC: 3.63 MIL/uL — ABNORMAL LOW (ref 3.87–5.11)
WBC: 8 10*3/uL (ref 4.0–10.5)

## 2010-04-28 LAB — URINE CULTURE

## 2010-04-29 LAB — BASIC METABOLIC PANEL
BUN: 4 mg/dL — ABNORMAL LOW (ref 6–23)
CO2: 28 mEq/L (ref 19–32)
Chloride: 102 mEq/L (ref 96–112)
GFR calc non Af Amer: >60 mL/min (ref >60)
Glucose, Bld: 88 mg/dL (ref 70–99)
Potassium: 3.6 mEq/L (ref 3.5–5.1)
Sodium: 136 mEq/L (ref 135–145)

## 2010-04-29 LAB — CBC
HCT: 34.7 % — ABNORMAL LOW (ref 36.0–46.0)
Hemoglobin: 11.1 g/dL — ABNORMAL LOW (ref 12.0–15.0)
MCV: 96.1 fL (ref 78.0–100.0)
RBC: 3.61 MIL/uL — ABNORMAL LOW (ref 3.87–5.11)
RDW: 13.4 % (ref 11.5–15.5)
WBC: 5.8 10*3/uL (ref 4.0–10.5)

## 2010-04-30 ENCOUNTER — Inpatient Hospital Stay (HOSPITAL_COMMUNITY)
Admission: AD | Admit: 2010-04-30 | Discharge: 2010-05-08 | DRG: 361 | Disposition: A | Discharge status: Home / Self Care | Payer: BC Managed Care – PPO | Source: Ambulatory Visit | Attending: Obstetrics and Gynecology | Admitting: Obstetrics and Gynecology

## 2010-04-30 ENCOUNTER — Inpatient Hospital Stay (HOSPITAL_COMMUNITY): Payer: BC Managed Care – PPO

## 2010-04-30 DIAGNOSIS — IMO0002 Reserved for concepts with insufficient information to code with codable children: Secondary | ICD-10-CM | POA: Diagnosis not present

## 2010-04-30 DIAGNOSIS — Y9210 Unspecified residential institution as the place of occurrence of the external cause: Secondary | ICD-10-CM | POA: Diagnosis not present

## 2010-04-30 DIAGNOSIS — K929 Disease of digestive system, unspecified: Secondary | ICD-10-CM

## 2010-04-30 DIAGNOSIS — Y838 Other surgical procedures as the cause of abnormal reaction of the patient, or of later complication, without mention of misadventure at the time of the procedure: Secondary | ICD-10-CM | POA: Diagnosis not present

## 2010-04-30 DIAGNOSIS — K560 Paralytic ileus: Secondary | ICD-10-CM

## 2010-04-30 DIAGNOSIS — N949 Unspecified condition associated with female genital organs and menstrual cycle: Principal | ICD-10-CM | POA: Diagnosis present

## 2010-04-30 HISTORY — PX: LAPAROSCOPY: SHX197

## 2010-04-30 LAB — CBC
HCT: 36.7 % (ref 36.0–46.0)
MCH: 31.3 pg (ref 26.0–34.0)
MCV: 93.4 fL (ref 78.0–100.0)
RDW: 13 % (ref 11.5–15.5)
WBC: 10.9 10*3/uL — ABNORMAL HIGH (ref 4.0–10.5)

## 2010-04-30 LAB — URINE MICROSCOPIC-ADD ON

## 2010-04-30 LAB — DIFFERENTIAL
Eosinophils Relative: 0 % (ref 0–5)
Lymphocytes Relative: 14 % (ref 12–46)
Lymphs Abs: 1.5 10*3/uL (ref 0.7–4.0)
Monocytes Relative: 8 % (ref 3–12)

## 2010-04-30 LAB — COMPREHENSIVE METABOLIC PANEL
ALT: 17 U/L (ref 0–35)
AST: 17 U/L (ref 0–37)
Albumin: 3.2 g/dL — ABNORMAL LOW (ref 3.5–5.2)
Calcium: 8.8 mg/dL (ref 8.4–10.5)
GFR calc Af Amer: 48 mL/min — ABNORMAL LOW (ref >60)
Sodium: 133 mEq/L — ABNORMAL LOW (ref 135–145)
Total Protein: 6.7 g/dL (ref 6.0–8.3)

## 2010-04-30 LAB — URINALYSIS, ROUTINE W REFLEX MICROSCOPIC
Ketones, ur: 40 mg/dL — AB
Specific Gravity, Urine: 1.025 (ref 1.005–1.030)
Urobilinogen, UA: 1 mg/dL (ref 0.0–1.0)
pH: 8 (ref 5.0–8.0)

## 2010-04-30 NOTE — H&P (Addendum)
  NAME:  [name], [name]                ACCOUNT NO.:  [id number]  MEDICAL RECORD NO.:  [id number]           PATIENT TYPE:  O  LOCATION:  [facility]                           [facility]  PHYSICIAN:  [name], MD        DATE OF BIRTH:  May 14, 1965  DATE OF ADMISSION:  04/22/2010 DATE OF DISCHARGE:  04/17/2010                             HISTORY & PHYSICAL   ADMISSION DIAGNOSIS:  Dyspareunia and hydrosalpinx on ultrasound.  PROCEDURE PLANNED:  Operative laparoscopy, bilateral salpingectomy.  HISTORY OF PRESENT ILLNESS:  This is a 45 year old G3, P2-0-1-2, who presents for normal annual exam and had some dyspareunia.  An ultrasound was checked, and it revealed the hydrosalpinges.  This was discussed at length with the patient, and she decided to proceed with surgical treatment for possible pain relief.  I did discuss with the patient risks, benefits, and alternatives of surgery including no relief of pain.  PAST MEDICAL HISTORY:  Significant for: 1. Depression. 2. Restless legs. 3. Abnormal Pap smear. 4. Dyspareunia.  PAST SURGICAL HISTORY:  Significant for: 1. Bilateral tubal ligation. 2. Cesarean section x2. 3. Cholecystectomy. 4. Cryotherapy. 5. Ganglion cyst excision. 6. Hysterectomy. 7. Plantar fasciitis.  PAST OBSTETRICS AND GYNECOLOGIC HISTORY:  She is G3, P2-0-1-2.  She has had a miscarriage, and 2 term cesarean sections.  First failure to press and the second as a repeat, and she also had her tubes tied.  She has an abnormal Pap smear, but she notes with cryotherapy has been normal since.  No history of any sexually transmitted diseases.  MEDICATIONS:  Effexor, Prilosec, and ReQuip.  ALLERGIES:  PENICILLIN, which causes hives.  SOCIAL HISTORY:  Denies.  Drug use; occasional.  Alcohol use and Tobacco use; she admits to a pack a day.  She is engaged.  FAMILY HISTORY:  Significant for colon cancer, COPD, heart disease, diabetes, hypertension, MI, and reflex  sympathetic dystrophy.  PHYSICAL EXAMINATION:  VITAL SIGNS:  On admission, she is afebrile. Vital signs are stable. GENERAL:  No apparent distress. CARDIOVASCULAR:  Regular rate and rhythm. LUNGS:  Clear to auscultation bilaterally. ABDOMEN:  Soft, nontender, nondistended with good bowel sounds. EXTREMITIES:  Symmetric and nontender. GENITOURINARY:  She had a normal external female genitalia; normal Bartholin, urethral, and Skene glands.  Her cuff is well healed.  ASSESSMENT/PLAN:  A 45 year old for treatment of hydrosalpinx with bilateral salpingectomy.  Discussed with the patient risks, benefits, and alternatives of the surgical procedure.  She wishes to proceed, and we will have the surgery on October 29.     [name], MD     JB/MEDQ  D:  04/21/2010  T:  04/21/2010  Job:  [id number]  Electronically Signed by [name] MD on 05/05/2010 01:42:35 PM

## 2010-04-30 NOTE — Discharge Summary (Signed)
  NAME:  [name] [name], [name] [name]                ACCOUNT NO.:  [id number]  MEDICAL RECORD NO.:  [id number]           PATIENT TYPE:  I  LOCATION:  9306                          [facility]  PHYSICIAN:  [name], MD        DATE OF BIRTH:  1965/11/03  DATE OF ADMISSION:  04/22/2010 DATE OF DISCHARGE:  04/23/2010                              DISCHARGE SUMMARY   ADMITTING DIAGNOSIS:  Status post operative laparoscopy for pain control.  DISCHARGE DIAGNOSIS:  Status post operative laparoscopy for pain control.  HISTORY OF PRESENT ILLNESS:  A 45 year old G2, P 2-0-1-2 who had a laparoscopic lysis of adhesions and right salpingectomy the morning of the 29th, had severe right lower quadrant pain since discharge to home. She came in with severe pain.  PAST MEDICAL HISTORY:  Significant for depression, sleep apnea, GERD, migraine and Barrett esophagus.  SURGICAL HISTORY:  Significant for C-section, cholecystectomy, hysterectomy secondary to endometriosis which she had not disclosed prior to surgery, laparoscopy, elbow surgery.  OBSTETRICS AND GYNECOLOGIC HISTORY:  She has had 2 cesarean sections and a tubal ligation.  She had a hysterectomy secondary to bleeding and cryotherapy for abnormal Paps.  MEDICATIONS:  Effexor, Prilosec and ReQuip.  ALLERGIES:  To PENICILLIN.  SOCIAL HISTORY:  She is a smoker.  FAMILY HISTORY:  Denies.  She was admitted after several doses of IV Dilaudid, received a Dulcolax, had a bowel movement, was able to empty her bladder, had a CT scan which was within normal limits.  Her pain is reasonably well controlled.  On the day of discharge, general in no apparent distress, however she states she is uncomfortable.  Cardiovascular, regular rate and rhythm.  Lungs are clear to auscultation bilaterally.  Abdomen is soft, diffusely tender, mildly distended with quiet bowel sounds. Incisions were clean, dry and intact.  She was discharged to home after discussion of  p.o. pain medicines.  We will change her to Percocet to see if this works better for her.  She will be at the hospital for a while this morning but be discharged by approximately at lunchtime. Nursing staff will be in touch with me if there is any problems.  The patient voices and husband voices understanding of this.  They will follow up with me in the office in approximately a week or two.     [name], MD     JB/MEDQ  D:  04/23/2010  T:  04/23/2010  Job:  [id number]  Electronically Signed by [name] MD on 04/29/2010 09:45:10 AM

## 2010-04-30 NOTE — Op Note (Addendum)
  NAME:  [name], [name]                ACCOUNT NO.:  [id number]  MEDICAL RECORD NO.:  [id number]           PATIENT TYPE:  I  LOCATION:  [facility]                         [facility]  PHYSICIAN:  [name], MD        DATE OF BIRTH:  1965/06/14  DATE OF PROCEDURE:  04/22/2010 DATE OF DISCHARGE:                              OPERATIVE REPORT   PREOPERATIVE DIAGNOSES:  Right hydrosalpinx, dyspareunia.  POSTOPERATIVE DIAGNOSES:  Right hydrosalpinx, dyspareunia, extensive pelvic adhesions.  PROCEDURES:  Extensive lysis of pelvic adhesions, right salpingectomy.  SURGEON:  [name], MD  ASSISTANT:  Huel Cote, MD  ANESTHESIA:  General with 10 mL of 0.25% Marcaine for local anesthetic.  FINDINGS:  Extensive bowel to anterior abdominal wall adhesions as well as bowel to the peritoneal adhesions, hydrosalpinx, adhesions to cuff.  SPECIMEN:  Right hydrosalpinx to Pathology.  ESTIMATED BLOOD LOSS:  Minimal.  IV FLUIDS:  1100 mL.  URINE OUTPUT:  I and O cath prior to this procedure.  COMPLICATIONS:  Extensive adhesions noted.  DISPOSITION:  Stable to PACU following the procedure.  PROCEDURE:  After informed consent was reviewed with the patient including risks, benefits and alternatives of surgical procedure including but not limited to bleeding, infection, damage to surrounding organs, bowels, bladder, ureters and blood vessels as well as trouble healing, the patient was taken to the OR, placed on the table in supine position.  She was anesthetized with general anesthesia, was found to be adequate.  She was then placed in Yellofin stirrups, prepped and draped in a normal sterile fashion.  Her bladder was sterilely drained.  A sponge stick was placed in her vagina to manipulate her cuff and approximately 5 mm infraumbilical incision attempt was made to enter with a Veress.  This was unsuccessful, so converted to an open laparoscopy.  The fascia was incised and outlined with  a 0 Vicryl.  The Hasson was placed.  Brief pelvic survey was performed revealing extensive anterior abdominal wall bowel and cuff adhesions as previously noted.  Attention was turned to lysing these adhesions.  This was noted to be difficult.  At this time Dr. Senaida Ores joined in the Ramiro.  The hydrosalpinx was outlined and peeled from the cuff using the Harmonic scalpel.  Bleeding was noted to be under control.  The incisions were closed with a deep stitch of 0 Vicryl, so the fascia being reapproximated at the umbilicus.  Skin was closed with 4-0 Vicryl and Dermabond was applied to the incision.  The patient tolerated the procedure well.  During the procedure, indigo carmine was given to the patient to make sure that there was no ureteral or bladder injuries.  No blue was noted at the end of the procedure.  An I and O cath was performed revealing blue urine.     [name], MD     JB/MEDQ  D:  04/22/2010  T:  04/22/2010  Job:  [id number]  Electronically Signed by [name] MD on 05/05/2010 01:43:22 PM

## 2010-04-30 NOTE — Assessment & Plan Note (Signed)
Summary: Follow Up   Vital Signs:  Patient profile:   45 year old female Height:      66 inches Weight:      200.75 pounds BMI:     32.52 O2 Sat:      96 % on Room air Pulse rate:   4 / minute BP sitting:   90 / 60  (right arm)  Vitals Entered By: Glendell Docker CMA (April 09, 2010 2:23 PM)  O2 Flow:  Room air CC: follow-up visit Is Patient Diabetic? No Pain Assessment Patient in pain? no        Primary Care Provider:  Dondra Spry DO  CC:  follow-up visit.  History of Present Illness: 45 y/o white female for f/u depression / anxiety - stable  had left elbow surgery  tubal scheduled on within 1 month  uncle and 1/2  brother passed away trouble sleeping  Preventive Screening-Counseling & Management  Alcohol-Tobacco     Smoking Status: current  Allergies: 1)  ! Pcn SH/Risk Factors reviewed for relevance  Family History:  Mother alive at age 84 has chronic obstructive pulmonary disease, coronary artery disease, a history of stroke and also reflex sympathetic dystrophy.   She is status post pacemaker.  Father is age 4 with hypertension.  She is estranged from her father.  In her grandparents there is a family history of colon cancer.  Family History of Colon Cancer: Maternal Grandfather-diagnosed in his 75's. Family History of Colon Polyps: Maternal grandfather, Mother, Brother x 2 Family History of Breast Cancer: Mother           Social History: Occupation: Metallurgist Divorced  Alcohol use-yes -x 5 per weekend Current Smoker < 1 ppd          Physical Exam  General:  alert, well-developed, and well-nourished.   Lungs:  normal respiratory effort, normal breath sounds, and no dullness.   Heart:  normal rate, regular rhythm, and no gallop.   Neurologic:  cranial nerves II-XII intact and gait normal.     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 Take 1 capsule by mouth two times a day 2)  Venlafaxine Hcl 150 Mg Xr24h-cap (Venlafaxine hcl) .... Take 1 capsule by mouth once a day 3)  Lidocaine Viscous 2 % Soln (Lidocaine hcl) .... Take 1 tablespoon (15 ml) by mouth every 4 hours as needed for esophageal pain 4)  Ropinirole Hcl 0.5 Mg Tabs (Ropinirole hcl) .... One by mouth at bedtime as needed 5)  Alprazolam 0.25 Mg Tabs (Alprazolam) .... Take 1 tablet by mouth once a day 6)  Methocarbamol 500 Mg Tabs (Methocarbamol) .... One tablet by mouth every 6 hours as needed 7)  Bactrim Ds 800-160 Mg Tabs (Sulfamethoxazole-trimethoprim) .... Take 1 tablet by mouth two times a day 8)  Oxycodone Hcl 10 Mg Tabs (Oxycodone hcl) .... One tablet by mouth every 4-6 hours as needed pain  Patient Instructions: 1)  Please schedule a follow-up appointment in 4 months. Prescriptions: VENLAFAXINE HCL 150 MG XR24H-CAP (VENLAFAXINE HCL) Take 1 capsule by mouth once a day  #90 x 1   Entered and Authorized by:   D. Thomos Lemons DO   Signed by:   D. Thomos Lemons DO on 04/09/2010   Method used:   [facility]  [address] [id number]       [address]       [place], [place]  [address]       Ph: [phone] or [phone]       Fax: [phone]   RxID:   [id number] ROPINIROLE HCL 0.5 MG TABS (ROPINIROLE HCL) one by mouth at bedtime as needed  #30 x 5   Entered and Authorized by:   D. Thomos Lemons DO   Signed by:   D. Thomos Lemons DO on 04/09/2010   Method used:   [facility]        [facility]  [address] [id number]       [address]       [place], [place]  [address]       Ph: [phone] or [phone]       Fax: [phone]   RxID:   [id number]    Orders Added: 1)  Est. Patient Level III [69678]

## 2010-05-01 ENCOUNTER — Encounter (INDEPENDENT_AMBULATORY_CARE_PROVIDER_SITE_OTHER): Payer: Self-pay | Admitting: *Deleted

## 2010-05-01 LAB — CBC
HCT: 34.9 % — ABNORMAL LOW (ref 36.0–46.0)
Hemoglobin: 11.1 g/dL — ABNORMAL LOW (ref 12.0–15.0)
MCV: 96.7 fL (ref 78.0–100.0)
Platelets: 204 10*3/uL (ref 150–400)
RBC: 3.61 MIL/uL — ABNORMAL LOW (ref 3.87–5.11)
WBC: 10.8 10*3/uL — ABNORMAL HIGH (ref 4.0–10.5)

## 2010-05-01 LAB — BASIC METABOLIC PANEL
BUN: 6 mg/dL (ref 6–23)
CO2: 28 mEq/L (ref 19–32)
Chloride: 107 mEq/L (ref 96–112)
Glucose, Bld: 94 mg/dL (ref 70–99)
Potassium: 4 mEq/L (ref 3.5–5.1)

## 2010-05-03 ENCOUNTER — Encounter (INDEPENDENT_AMBULATORY_CARE_PROVIDER_SITE_OTHER): Payer: Self-pay | Admitting: *Deleted

## 2010-05-03 ENCOUNTER — Inpatient Hospital Stay (HOSPITAL_COMMUNITY): Payer: BC Managed Care – PPO

## 2010-05-03 LAB — BASIC METABOLIC PANEL
CO2: 34 mEq/L — ABNORMAL HIGH (ref 19–32)
Calcium: 8.5 mg/dL (ref 8.4–10.5)
Creatinine, Ser: 0.68 mg/dL (ref 0.4–1.2)
Potassium: 3.1 mEq/L — ABNORMAL LOW (ref 3.5–5.1)

## 2010-05-03 LAB — CBC
HCT: 32.4 % — ABNORMAL LOW (ref 36.0–46.0)
MCV: 95.9 fL (ref 78.0–100.0)
RDW: 13.4 % (ref 11.5–15.5)
WBC: 7.6 10*3/uL (ref 4.0–10.5)

## 2010-05-03 MED ORDER — IOHEXOL 300 MG/ML  SOLN: 100.0000 mL | Freq: Once | INTRAMUSCULAR | Status: AC | PRN

## 2010-05-05 LAB — BASIC METABOLIC PANEL
Chloride: 101 mEq/L (ref 96–112)
GFR calc Af Amer: >60 mL/min (ref >60)
Potassium: 2.8 mEq/L — ABNORMAL LOW (ref 3.5–5.1)

## 2010-05-05 LAB — CBC
Platelets: 181 10*3/uL (ref 150–400)
RBC: 3.06 MIL/uL — ABNORMAL LOW (ref 3.87–5.11)
WBC: 4.7 10*3/uL (ref 4.0–10.5)

## 2010-05-05 NOTE — Discharge Summary (Signed)
  NAME:  [name] White, [name] White                ACCOUNT NO.:  [id number]  MEDICAL RECORD NO.:  [id number]           PATIENT TYPE:  I  LOCATION:  9320                          FACILITY:  [facility]  PHYSICIAN:  [name], MD        DATE OF BIRTH:  09-Nov-1965  DATE OF ADMISSION:  04/26/2010 DATE OF DISCHARGE:  04/29/2010                              DISCHARGE SUMMARY   ADMITTING DIAGNOSIS:  Abdominal pain, status post laparoscopic lysis of adhesions, right salpingectomy.  DISCHARGE DIAGNOSES:  Abdominal pain, status post laparoscopic lysis of adhesions, right salpingectomy, ileus.  HISTORY OF PRESENT ILLNESS:  A 45 year old status post laparoscopy for lysis of adhesions, then right salpingectomy who presented to the hospital on the evening of April 26, 2010 after surgery on April 22, 2010, says that she has had continuous pain and previously had a normal CT.  She states that she has not had bowel movement, has not had vomiting, but has not felt well and does feel somewhat bloated.  PAST MEDICAL HISTORY:  Significant for: 1. Depression. 2. GERD. 3. Migraine. 4. UTI.  SURGICAL HISTORY:  Significant for C-section, cholecystectomy, hysterectomy, laparoscopy, salpingectomy and orthopedic procedure.  PAST OB/GYN HISTORY:  No significant Ob/Gyn history.  MEDICATIONS:  Percocet.  ALLERGIES:  PENICILLIN causes hives.  She also takes Prilosec, Effexor, ropinirole and venlafaxine.  SOCIAL HISTORY:  Denies alcohol, tobacco, or drug use.  FAMILY HISTORY:  Denied.  HOSPITAL COURSE:  CT scan which was without contrast secondary mildly elevated creatinine, which was nondiagnostic of any bowel injury, did reveal bibasilar atelectasis and stable fluid in her abdomen, mild distention likely ileus.  Her labs; her creatinine preoperatively was 1.07 raised to 2.88, decreased to 2.02 down to 0.71 day after admission.  The patient was observed, had continued quiet bowel sounds.  Her white count decreased  from 14.5 to 5.5 on the day of discharge.  We discussed with the patient possible need for surgery to reassess her bowel injury and continued bowel rest.  Ureters were intact on the skin as well.  She was followed passed flatus on day #2 of admission.  Complained of hunger at this time.  She was monitored closely, was advanced to fluids on her diet.  Her abdomen was always soft and she always had bowel sounds.  She did have vomiting in the evening of April 28, 2010; however, overnight she also had several bowel movements.  On the morning of April 29, 2010, she felt her pain was very much improved and she was feeling much better.  She was ambulating, voiding and able to care for herself.  At this time, she requested discharge to home.  She was discharged to home with routine discharge instructions.  She will follow up in the office in approximately a week.  She voiced understanding to all of this and understands that she must progress low with her diet at home.     [name], MD     JB/MEDQ  D:  04/29/2010  T:  04/29/2010  Job:  [id number]  Electronically Signed by [name] MD on 05/05/2010 01:45:08 PM

## 2010-05-05 NOTE — Op Note (Signed)
NAME:  [name] White, [name] White                ACCOUNT NO.:  [id number]  MEDICAL RECORD NO.:  [id number]           PATIENT TYPE:  I  LOCATION:  9320                          [facility]ACILITY:  [facility]  PHYSICIAN:  [name], MD        DATE OF BIRTH:  [birth year]  DATE OF PROCEDURE:  04/30/2010 DATE OF DISCHARGE:                              OPERATIVE REPORT   PREOPERATIVE DIAGNOSIS:  Continued pain after laparoscopy.  POSTOPERATIVE DIAGNOSIS:  No bowel injury, distended bowel, likely ileus, cystotomy.  PROCEDURES:  Diagnostic laparoscopy, minilaparotomy, cystotomy repair, cystotomy.  SURGEON:  [name], MD  ASSISTANT:  Zenaida Niece, MD  ANESTHESIA:  General endotracheal.  FINDINGS:  Dilated bowel and cystotomy.  COMPLICATIONS:  None.  PATHOLOGY:  None.  ESTIMATED BLOOD LOSS:  200 mL.  IV FLUIDS:  2400 mL.  URINE OUTPUT:  500 mL clear and bloody urine.  DISPOSITION:  Stable to PACU.  PROCEDURE:  After informed consent was reviewed with the patient including risks, benefits and alternatives of the procedure, she was transferred to the OR, placed on the table in supine position.  General anesthesia was induced and found to be adequate.  She was then placed in the Yellofin stirrups, prepped and draped in the normal sterile fashion. Her infraumbilical incision was then reopened using a Hasson, open laparoscopy was performed.  A brief pelvic survey was performed revealing dilated loops of bowel; however, no exudate, no injury noted. Multiple mental adhesions and other adhesions were noted.  Incision was made to terminate the procedure after the infraumbilical incision was closed with closure at the pursestring around the trocar site.  A deep suture and skin was closed with the subcuticular layer and then Dermabond.  It was noted that there was air in the Foley bulb.  The cystoscopy was performed revealing an injury to the dome of the bladder and Foley catheter was then placed.   Urology was called.  The surgeon was involved in an emergency, we discussed the repair of the cystotomy. Minilaparotomy was performed.  A small Pfannenstiel skin incision was made, carried through the underlying layer of fascia with the Bovie cautery.  Fascia was incised in midline.  The incision was extended laterally with Mayo scissors.  The superior aspect of the fascial incision was grasped with Kocher clamps, elevated, and the rectus muscles were dissected off both bluntly and sharply.  Attention was turned to the inferior portion, which in a similar fashion was elevated with Kocher clamps, rectus muscles were dissected off both bluntly and sharply.  The midline was easily identified.  The peritoneum was tented up with hemostats and taken down layer by layer to enter the abdominal cavity.  The incision was extended superiorly and inferiorly with good visualization of the bladder.  The small Alexis retractor was placed carefully making sure that no bowel was entrapped.  Three moist laparotomy sponges were used to pack away the bowels.  Brief exam revealed the cystotomy was outlined with Allis clamps and was repaired the first layer of 2-0 chromic as to close the incision in the second layer as an imbricating  layer.  The bladder was then backfilled with sterile milk, it was noted that there was a leak, the leak was not identified.  A cystotomy was performed carefully checking to make sure no further bladder injury was noted.  Bilateral ureteral jets were visualized.  Third layer was used to close over where the cystotomy had been, it was noted to be hemostatic.  The Alexis retractor was removed. The peritoneum was reapproximated with 2-0 Vicryl in a running fashion. The fascia was closed with 0 Vicryl in a running fashion.  Subcuticular adipose layer was made hemostatic and then reapproximated with 2-0 plain gut.  The skin was closed with staples.  Foley catheter was placed in the  bladder.  The patient was awakened in stable condition and taken to the PACU from where an admission was planned.     [name], MD     JB/MEDQ  D:  04/30/2010  T:  05/01/2010  Job:  [id number]  Electronically Signed by [name] MD on 05/05/2010 01:46:13 PM

## 2010-05-06 ENCOUNTER — Telehealth: Payer: Self-pay | Admitting: Internal Medicine

## 2010-05-06 LAB — BASIC METABOLIC PANEL
BUN: 5 mg/dL — ABNORMAL LOW (ref 6–23)
Chloride: 103 mEq/L (ref 96–112)
Glucose, Bld: 60 mg/dL — ABNORMAL LOW (ref 70–99)
Potassium: 4.1 mEq/L (ref 3.5–5.1)

## 2010-05-06 LAB — CBC
HCT: 31 % — ABNORMAL LOW (ref 36.0–46.0)
MCV: 96.6 fL (ref 78.0–100.0)
RBC: 3.21 MIL/uL — ABNORMAL LOW (ref 3.87–5.11)
WBC: 4 10*3/uL (ref 4.0–10.5)

## 2010-05-06 LAB — MAGNESIUM: Magnesium: 1.9 mg/dL (ref 1.5–2.5)

## 2010-05-07 ENCOUNTER — Inpatient Hospital Stay (HOSPITAL_COMMUNITY): Payer: BC Managed Care – PPO

## 2010-05-07 LAB — BASIC METABOLIC PANEL
CO2: 33 mEq/L — ABNORMAL HIGH (ref 19–32)
Calcium: 8.3 mg/dL — ABNORMAL LOW (ref 8.4–10.5)
Chloride: 104 mEq/L (ref 96–112)
Glucose, Bld: 95 mg/dL (ref 70–99)
Sodium: 139 mEq/L (ref 135–145)

## 2010-05-07 LAB — CBC
HCT: 33.6 % — ABNORMAL LOW (ref 36.0–46.0)
Hemoglobin: 10.5 g/dL — ABNORMAL LOW (ref 12.0–15.0)
MCHC: 31.3 g/dL (ref 30.0–36.0)

## 2010-05-08 LAB — BASIC METABOLIC PANEL
BUN: 2 mg/dL — ABNORMAL LOW (ref 6–23)
CO2: 31 mEq/L (ref 19–32)
Chloride: 106 mEq/L (ref 96–112)
Creatinine, Ser: 0.77 mg/dL (ref 0.4–1.2)
Glucose, Bld: 103 mg/dL — ABNORMAL HIGH (ref 70–99)

## 2010-05-08 LAB — CBC
HCT: 32.7 % — ABNORMAL LOW (ref 36.0–46.0)
Hemoglobin: 10.1 g/dL — ABNORMAL LOW (ref 12.0–15.0)
MCH: 30.1 pg (ref 26.0–34.0)
MCV: 97.3 fL (ref 78.0–100.0)
Platelets: 230 10*3/uL (ref 150–400)
RBC: 3.36 MIL/uL — ABNORMAL LOW (ref 3.87–5.11)

## 2010-05-12 NOTE — Op Note (Signed)
Summary: Operative report   NAME:  [name] White, [name] White                ACCOUNT NO.:  [id number]      MEDICAL RECORD NO.:  [id number]           PATIENT TYPE:  I      LOCATION:  9320                          [facility]:  [facility]      PHYSICIAN:  [name], MD        DATE OF BIRTH:  1965/06/05      DATE OF PROCEDURE:  04/30/2010   DATE OF DISCHARGE:                                  OPERATIVE REPORT         PREOPERATIVE DIAGNOSIS:  Continued pain after laparoscopy.      POSTOPERATIVE DIAGNOSIS:  No bowel injury, distended bowel, likely   ileus, cystotomy.      PROCEDURES:  Diagnostic laparoscopy, minilaparotomy, cystotomy repair,   cystotomy.      SURGEON:  [name], MD      ASSISTANT:  Zenaida Niece, MD      ANESTHESIA:  General endotracheal.      FINDINGS:  Dilated bowel and cystotomy.      COMPLICATIONS:  None.      PATHOLOGY:  None.      ESTIMATED BLOOD LOSS:  200 mL.      IV FLUIDS:  2400 mL.      URINE OUTPUT:  500 mL clear and bloody urine.      DISPOSITION:  Stable to PACU.      PROCEDURE:  After informed consent was reviewed with the patient   including risks, benefits and alternatives of the procedure, she was   transferred to the OR, placed on the table in supine position.  General   anesthesia was induced and found to be adequate.  She was then placed in   the Yellofin stirrups, prepped and draped in the normal sterile fashion.   Her infraumbilical incision was then reopened using a Hasson, open   laparoscopy was performed.  A brief pelvic survey was performed   revealing dilated loops of bowel; however, no exudate, no injury noted.   Multiple mental adhesions and other adhesions were noted.  Incision was   made to terminate the procedure after the infraumbilical incision was   closed with closure at the pursestring around the trocar site.  A deep   suture and skin was closed with the subcuticular layer and then   Dermabond.  It was noted that there was air in  the Foley bulb.  The   cystoscopy was performed revealing an injury to the dome of the bladder   and Foley catheter was then placed.  Urology was called.  The surgeon   was involved in an emergency, we discussed the repair of the cystotomy.   Minilaparotomy was performed.  A small Pfannenstiel skin incision was   made, carried through the underlying layer of fascia with the Bovie   cautery.  Fascia was incised in midline.  The incision was extended   laterally with Mayo scissors.  The superior aspect of the fascial   incision was grasped with Kocher clamps, elevated, and the rectus   muscles were  dissected off both bluntly and sharply.  Attention was   turned to the inferior portion, which in a similar fashion was elevated   with Kocher clamps, rectus muscles were dissected off both bluntly and   sharply.  The midline was easily identified.  The peritoneum was tented   up with hemostats and taken down layer by layer to enter the abdominal   cavity.  The incision was extended superiorly and inferiorly with good   visualization of the bladder.  The small Alexis retractor was placed   carefully making sure that no bowel was entrapped.  Three moist   laparotomy sponges were used to pack away the bowels.  Brief exam   revealed the cystotomy was outlined with Allis clamps and was repaired   the first layer of 2-0 chromic as to close the incision in the second   layer as an imbricating layer.  The bladder was then backfilled with   sterile milk, it was noted that there was a leak, the leak was not   identified.  A cystotomy was performed carefully checking to make sure   no further bladder injury was noted.  Bilateral ureteral jets were   visualized.  Third layer was used to close over where the cystotomy had   been, it was noted to be hemostatic.  The Alexis retractor was removed.   The peritoneum was reapproximated with 2-0 Vicryl in a running fashion.   The fascia was closed with 0 Vicryl in a  running fashion.  Subcuticular   adipose layer was made hemostatic and then reapproximated with 2-0 plain   gut.  The skin was closed with staples.  Foley catheter was placed in   the bladder.  The patient was awakened in stable condition and taken to   the PACU from where an admission was planned.               [name], MD               JB/MEDQ  D:  04/30/2010  T:  05/01/2010  Job:  [id number]

## 2010-05-12 NOTE — Progress Notes (Signed)
Summary: Status Update  Phone Note Call from Patient Call back at Home Phone [phone]   Caller: Patient Call For: D. Thomos Lemons DO Summary of Call: patient called and left voice messsage stating she wanted Dr Artist Pais to be aware that she has been in the hospital since February 29 from complication from a hole in her bladder. She is also requesting her previous wt. Her message states that she has other questions that she would like to ask and requested a return phone call.  Glendell Docker CMA,  May 06, 2010 11:56 AM  Call was returned to patient at [phone], no answer. A voice message was left informing patient call was returned .Glendell Docker CMA  May 06, 2010 11:59 AM   Follow-up for Phone Call        patient returned called stating she was in and out of the hospital 3 times since February 29th . She states she had a hole in her bladder, that had been repaired. She wanted Dr Artist Pais to know that her reflux  has worsened since being in the hospital.  She has been throwing up bile since Thursday and informed she had a lazy bowel and slow ileus.  She states she wanted to make Dr Artist Pais aware of what was going on with her.  Follow-up by: Glendell Docker CMA,  May 06, 2010 2:29 PM  Additional Follow-up for Phone Call Additional follow up Details #1::        noted Additional Follow-up by: D. Thomos Lemons DO,  May 08, 2010 1:17 PM

## 2010-05-12 NOTE — Progress Notes (Signed)
Summary: Speak to nurse   Phone Note Call from Patient Call back at Home Phone [phone]   Call For: Dr Juanda Chance Reason for Call: Talk to Nurse Summary of Call: wants to discuss some concerns. Initial call taken by: Leanor Kail [facility],  May 06, 2010 11:24 AM  Follow-up for Phone Call        Patient calling from [facility] where she is currently a patient. She states she had a "tube" removed by Dr. Ellyn Hack and had complications- "a hole in my bladder and possible damaged intestines." States she had another surgery on 04/30/10 to look for problems. She is worried that she might have problems with her colon and wants to know if she would need a colonoscopy. Advised patient to ask for GI consult from her current physician. Follow-up by: Jesse Fall RN,  May 06, 2010 12:05 PM  Additional Follow-up for Phone Call Additional follow up Details #1::        Received a call from Tiffany at Dr. Emeline Darling office. She is calling to let Dr. Juanda Chance know that Dr. Ellyn Hack said she does not need to come and see patient at the hospital. Additional Follow-up by: Jesse Fall RN,  May 07, 2010 9:24 AM

## 2010-05-13 ENCOUNTER — Telehealth: Payer: Self-pay | Admitting: Internal Medicine

## 2010-05-13 NOTE — Telephone Encounter (Signed)
Left message for patient to call back. Jesse Fall, RN 05/13/10 2:56 PM

## 2010-05-13 NOTE — Telephone Encounter (Signed)
Returned patient's call, Left message for patient to call back. Jesse Fall, RN 05/13/10 4:00 PM

## 2010-05-14 NOTE — Telephone Encounter (Signed)
Left message for patient to call back. Jesse Fall, RN 05/14/10 9:31 AM

## 2010-05-15 NOTE — Telephone Encounter (Signed)
Please send Flagyl 250 mg po tid,#21, no refill, and Bentyl 20mg , #20, 1 po bid, no refill

## 2010-05-15 NOTE — Telephone Encounter (Signed)
Left message for patient to call back. [name] White

## 2010-05-15 NOTE — Telephone Encounter (Signed)
Spoke with patient. She was in the hospital from 2/29-3/10/12. She had surgery that resulted in a hole in her bladder and an ileus. Patient states she vomited bile until March 10th. She states that since being d/c'ed from the hospital she has had diarrhea.States her surgeon has sent a culture on the stool but she has not gotten results yet. Patient wants to be seen by Dr. Juanda Chance. Scheduled patient on 05/22/10 at 3:00 PM.

## 2010-05-18 MED ORDER — DICYCLOMINE HCL 20 MG PO TABS: 20.0000 mg | ORAL_TABLET | Freq: Two times a day (BID) | ORAL | Status: DC | PRN

## 2010-05-18 MED ORDER — METRONIDAZOLE 250 MG PO TABS: 250.0000 mg | ORAL_TABLET | Freq: Three times a day (TID) | ORAL | Status: AC

## 2010-05-18 NOTE — Telephone Encounter (Signed)
Left message for patient to call me. Jesse Fall

## 2010-05-18 NOTE — Telephone Encounter (Signed)
Spoke with patient and gave her Dr. Regino Schultze  Recommendations. Rx sent to patients pharmacy.Jesse Fall

## 2010-05-20 ENCOUNTER — Telehealth: Payer: Self-pay | Admitting: *Deleted

## 2010-05-20 NOTE — Discharge Summary (Signed)
NAME:  [name] White, [name] White                ACCOUNT NO.:  [id number]  MEDICAL RECORD NO.:  [id number]           PATIENT TYPE:  I  LOCATION:  9320                          [facility]ACILITY:  [facility]  PHYSICIAN:  [name], MD        DATE OF BIRTH:  03/23/1965  DATE OF ADMISSION:  04/30/2010 DATE OF DISCHARGE:  05/08/2010                              DISCHARGE SUMMARY   ADMITTING DIAGNOSIS:  Persistent pelvic pain after surgery.  PROCEDURES:  Diagnostic laparoscopy, minilaparotomy with cystotomy repair, NG-tube placement.  DISCHARGE DIAGNOSES:  Status post minilaparotomy with cystotomy repair, and Foley drainage until confirmed healed by cystogram as well as ileus, treated with NG tube.  HISTORY OF PRESENT ILLNESS:  A 45 year old, status post laparoscopic right salpingectomy on April 22, 2010, status post readmission for pain with normal CT on March 1 as well as readmission March 4 with continued pain and inability to tolerate p.o.  Rescan was largely normal CT without contrast.  She had been treated for this time for an ileus with conservative management.  Discharged on March 7 with several bowel movements and flatus.  On admission, she was somewhat inconsistent with symptoms of migrating numbness, loss of speech, etc., chest pain.  She was initially evaluated with a head CT secondary to her symptoms and was finally agreeing to diagnostic laparoscopy for possible identification of bowel injury or other complication from surgery.  PAST MEDICAL HISTORY:  Significant for depression, GERD, migraine, and bladder infection.  PAST SURGICAL HISTORY:  Significant for cesarean section x2, cholecystectomy, hysterectomy, orthopedic procedure, operative laparoscopy, right salpingectomy, and bilateral tubal ligation.  PAST OB/GYN HISTORY:  She is a G2, P 2-0-1-2 with a miscarriage and 2 full term low transverse cesarean sections.  She has had an abnormal Pap smear in the past, treated with cryo.   Followup was normal with no history of any sexually transmitted diseases.  MEDICATIONS ON ADMISSION:  Ropinirole, oral lidocaine, Effexor, and omeprazole.  ALLERGIES:  Penicillin.  SOCIAL HISTORY:  She admits to smoking a pack a day.  Occasional alcohol use.  No drug use.  She is engaged.  FAMILY HISTORY:  Significant for colon cancer, COPD, heart disease, diabetes, hypertension, MI as well as reflex sympathetic dystrophy.  The night prior to re-admission, on March 8, she complained of nausea, vomiting, and diarrhea.  She also described having a burning feeling in her pelvis.  On admission, she was afebrile.  Vital signs stable, somewhat uncomfortable.  Her white count was increased.  Her urine showed ketones, small blood, and protein.  Her creatinine was somewhat elevated and negative head CT.  Discussed with the patient risks, benefits, and alternatives of the diagnostic laparoscopy with possible need for general surgery and for evaluation if there was a bowel injury. She underwent a diagnostic laparoscopy, which was nondiagnostic.  As we were removing instruments, it was noted that her Foley bag was inflated. After performing a cystoscopy, it was noted that she had approximately 1 cm cystotomy.  Minilap was performed to repair this.  Dr. Marcelle Overlie was consulted but was unable to attend in the OR.  Her  repair was 2 to 3 layers of 2-0 chromic and a Foley was left to straight drain for at least a week.  She was given sips and chips.  Then, following surgery and then x-ray, she had passed gas, and it appeared that he had an ileus that was resolving.  She received another CT that confirmed an ileus, postoperative changes, and intestinal thickening thought to be due to the cystomy and urine leakage.  An NG tube was placed after the patient vomited approximately 2 liters.  She had quick resolution with the NG tube.  It was clamped, which was well tolerated as of postoperative day #5, it  was intermittently clamped with checking the residual.  She was given potassium for likely resolving ileus.  Her creatinine and white counts were followed.  Her staples were removed from her incision.  She had flatus on the 14 and also flatus and multiple diarrhetic bowel movements. On the 15, her diet was advanced.  The cystogram was performed confirming no extravasation of fluid from her bladder.  She is able to tolerate a diet.  Her fluids and NG tube were discontinued.  IV fluids were discontinued on the 15.  NG tube was discontinued on the 16 per the patient's request.  She was discharged to home in the afternoon on the 16.  At this time, she was ambulating without difficulty, voiding without difficulty, tolerating a diet.  She was discharged to home.  She has prescriptions at home for Vicodin and Motrin.  She was given a prescription for Phenergan.  She will follow up in the office in approximately 1-2 weeks.  She was discharged home with routine discharge instructions and numbers to call if any questions or problems.  She voiced understanding to all of this.     [name], MD     JB/MEDQ  D:  05/08/2010  T:  05/09/2010  Job:  [id number]  Electronically Signed by [name] MD on 05/20/2010 04:23:31 PM

## 2010-05-20 NOTE — Telephone Encounter (Signed)
Patient called and left voice message stating her pharmacy informed her the Omeprazole will need prior authorization.She states the current authorization has expired in December of 2011

## 2010-05-22 ENCOUNTER — Encounter: Payer: Self-pay | Admitting: Internal Medicine

## 2010-05-22 ENCOUNTER — Ambulatory Visit (INDEPENDENT_AMBULATORY_CARE_PROVIDER_SITE_OTHER): Payer: BC Managed Care – PPO | Admitting: Internal Medicine

## 2010-05-22 VITALS — BP 110/70 | HR 84 | Ht 67.0 in | Wt 196.2 lb

## 2010-05-22 DIAGNOSIS — K560 Paralytic ileus: Secondary | ICD-10-CM

## 2010-05-22 DIAGNOSIS — R933 Abnormal findings on diagnostic imaging of other parts of digestive tract: Secondary | ICD-10-CM

## 2010-05-22 MED ORDER — OMEPRAZOLE 40 MG PO CPDR: 40.0000 mg | DELAYED_RELEASE_CAPSULE | Freq: Two times a day (BID) | ORAL | Status: DC

## 2010-05-22 NOTE — Patient Instructions (Signed)
We have sent a refill to your pharmacy for omeprazole 20 mg tablets. You should take 1 tablet by mouth twice daily. You will be due for a recall colonoscopy in December 2012. We will send a reminder to you in the mail when it gets closer to that time. CC: Dr Casey Burkitt

## 2010-05-22 NOTE — Progress Notes (Signed)
[name] White 23-May-1965 MRN [id number]    History of Present Illness:  This is a 45 year old white female who is post hospitalization for repair of a bladder perforation which occurred during a pelvic surgery in February 2012. The surgery was complicated by an adynamic ileus, resulting in abdominal distention, nausea and vomiting of bilious material. After surgical repair, she has gotten gradually better and is now back to normal. Her postoperative diarrhea has resolved. She denies rectal bleeding, nausea or vomiting. We have seen the patient for Barrett's esophagus diagnosed on an upper endoscopy in 2007. A repeat upper endoscopy in 07/2009 did not show any Barrett's metaplasia. She has a history colon polyps in 2007. The polyp was hyperplastic. There is also a family history of colon cancer in a grandparent. She is due for a repeat colonoscopy in December 2012.   Past Medical History  Diagnosis Date  . Depression   . Plantar fasciitis   . Barrett's esophagus   . Colon polyp     hyperplastic  . Gastritis   . Internal hemorrhoids   . Grief reaction   . Chronic headache   . Sunburn   . Bronchitis   . Chest pain, atypical   . Tobacco abuse   . Migraine   . Restless leg    Past Surgical History  Procedure Date  . Wrist surgery     right  . Cesarean section     x 2  . Cholecystectomy   . Tubal ligation   . Plantar fascitis     right foot  . Abdominal hysterectomy     secondary to endometriosis  . Laparoscopy 04-22-10    lysis of adhesions  . Elbow surgery     left  . Salpingectomy 04-22-10    right  . Laparoscopy 04-30-10     with mini laparotomy and cystotomy repair    reports that she quit smoking about 4 weeks ago. Her smoking use included Cigarettes. She smoked 0 packs per day. She does not have any smokeless tobacco history on file. She reports that she drinks alcohol. She reports that she does not use illicit drugs. family history includes Breast cancer in her maternal  aunt and mother; COPD in her mother; Colon cancer in her maternal grandfather; Colon polyps in her brother, maternal grandfather, and mother; Coronary artery disease in her mother; Hypertension in her father; Stroke in her mother; and Uterine cancer in her cousin. Allergies  Allergen Reactions  . Penicillins     REACTION: hives        Review of Systems: Negative for shortness of breath, chest pain, dysphagia, positive for heartburn, positive for diarrhea, negative for all rectal bleeding, positive for abdominal pain.  The remainder of the 10  point ROS is negative except as outlined in H&P   Physical Exam: General appearance  Well developed, in no distress. Eyes- non icteric. HEENT nontraumatic, normocephalic. Mouth no lesions, tongue papillated, no cheilosis. Neck supple without adenopathy, thyroid not enlarged, no carotid bruits, no JVD. Lungs Clear to auscultation bilaterally. Cor normal S1, normal S2, regular rhythm , no murmur,  quiet precordium. Abdomen soft and relax with marked tenderness across the suprapubic area. Well-healed suprapubic scar. Normoactive bowel sounds. No distention. No tympany. No rebound. Rectal: Small external skin tags. Normal rectal sphincter tone. Stool is Hemoccult negative. Extremities no pedal edema. Skin no lesions. Neurological alert and oriented x 3. Psychological normal mood and affect.  Assessment and Plan:  Problems #1- status post adynamic  ileus which was resulted from a postoperative complication of a perforated bladder. It has been surgically repaired and the patient has done well.   Problem #2- Barrett's esophagus was diagnosed in 2007 but not reproduced in June 2011. She will be due for a repeat upper endoscopy in June 2013. In the meantime, she needs to continue on omeprazole 40 mg twice a day.    Problem #3- personal history of colon polyps. Patient had a hyperplastic polyp in 2007. She has a family history of colon cancer in a  grandparent. She is due for a repeat colonoscopy in December 2012.  Plan High-fiber diet. Repeat upper endoscopy in June 2013 for followup of Barrett's esophagus. Recall colonoscopy December 2012. Continue Prilosec 40 mg by mouth twice a day. Antireflux measures. Return when necessary.  05/22/2010 [name]

## 2010-05-25 ENCOUNTER — Other Ambulatory Visit: Payer: Self-pay | Admitting: Internal Medicine

## 2010-05-25 NOTE — Telephone Encounter (Signed)
I have requested a prior authorization for this patient's omeprazole bid dosing.

## 2010-05-25 NOTE — Telephone Encounter (Signed)
Call placed to [facility] spoke with Maralyn Sago regarding prior authorization for  Omeprazole, She provided the number to call [phone]  Call placed to insurance routed to [phone]. Patient ID [id number]- Tiffany  Call transferred to [employer] [phone], she stated she will fax a prior authorization form for completion

## 2010-05-27 NOTE — Telephone Encounter (Signed)
Per insurance note, patient's omeprazole BID dosing has been approved from 05/26/10 through 05/26/11. Alex at [facility] pharmacy advised. Patient advised as well.

## 2010-05-27 NOTE — Telephone Encounter (Signed)
Medication refilled By GI on 05/22/2010

## 2010-05-30 LAB — DIFFERENTIAL
Basophils Absolute: 0.2 10*3/uL — ABNORMAL HIGH (ref 0.0–0.1)
Eosinophils Relative: 0 % (ref 0–5)
Lymphocytes Relative: 17 % (ref 12–46)
Lymphs Abs: 1.6 10*3/uL (ref 0.7–4.0)
Monocytes Absolute: 0.7 10*3/uL (ref 0.1–1.0)
Monocytes Relative: 7 % (ref 3–12)
Neutrophils Relative %: 74 % (ref 43–77)

## 2010-05-30 LAB — CBC
HCT: 44.6 % (ref 36.0–46.0)
Hemoglobin: 15.6 g/dL — ABNORMAL HIGH (ref 12.0–15.0)
MCHC: 34.9 g/dL (ref 30.0–36.0)
MCV: 94 fL (ref 78.0–100.0)
Platelets: 170 10*3/uL (ref 150–400)
RBC: 4.75 MIL/uL (ref 3.87–5.11)
RDW: 12.8 % (ref 11.5–15.5)

## 2010-05-30 LAB — BASIC METABOLIC PANEL
BUN: 5 mg/dL — ABNORMAL LOW (ref 6–23)
CO2: 21 mEq/L (ref 19–32)
Chloride: 110 mEq/L (ref 96–112)
Creatinine, Ser: 0.81 mg/dL (ref 0.4–1.2)
Glucose, Bld: 115 mg/dL — ABNORMAL HIGH (ref 70–99)
Potassium: 3.4 mEq/L — ABNORMAL LOW (ref 3.5–5.1)

## 2010-05-30 LAB — POCT CARDIAC MARKERS
CKMB, poc: <1 ng/mL — ABNORMAL LOW (ref 1.0–8.0)
Myoglobin, poc: 101 ng/mL (ref 12–200)
Troponin i, poc: <0.05 ng/mL (ref 0.00–0.09)

## 2010-06-01 ENCOUNTER — Telehealth: Payer: Self-pay | Admitting: Internal Medicine

## 2010-06-01 DIAGNOSIS — R197 Diarrhea, unspecified: Secondary | ICD-10-CM

## 2010-06-01 MED ORDER — DIPHENOXYLATE-ATROPINE 2.5-0.025 MG PO TABS: ORAL_TABLET | ORAL | Status: DC

## 2010-06-01 NOTE — Telephone Encounter (Signed)
Refill-venlafaxine hcl er 150mg  cap. Take 1 capsule by mouth once a day. Qty 30. Last fill 3.4.12.  Refill- ropinirole hcl 0.5mg  tablet. 1/2 tab at bedtime x 1 week. Then 1 at bedtime x 1 week. Then 2 tabs at bedtime.

## 2010-06-01 NOTE — Telephone Encounter (Signed)
Call placed to [facility] at [phone], verified with pharmacist Sarah rx on hold from February, she was informed refill request was received by office. She stated she will fill rx for patient.  Call placed to patient at [phone], she was informed rx's on file from [facility] contact pharmacy for refill. Patient has verbalized understanding and agrees.

## 2010-06-01 NOTE — Telephone Encounter (Signed)
Patient reports that she was started on flagyl due to diarrhea while she was inpatient after a bladder perforation.  She completed her flagyl RX last Thursday and since then she has diarrhea about 5 times a day, stool is loose to liquid.  She is having some abdominal cramping.  Denies fever.  She c/o nausea and she has been taking phenergan for.  She is able to tolerate a regular diet.  Dr Juanda Chance please advise.

## 2010-06-01 NOTE — Telephone Encounter (Signed)
Stool for C.Diff and stool Lactoferrin, Start Lomotil, #30 1 po qam. 1 refill.

## 2010-06-01 NOTE — Telephone Encounter (Signed)
Patient is advised of Dr Regino Schultze recommendations.  She will come for stool studies today.

## 2010-06-01 NOTE — Telephone Encounter (Signed)
I have entered orders for above.  Left message for patient to call back

## 2010-06-11 ENCOUNTER — Other Ambulatory Visit: Payer: Self-pay | Admitting: Internal Medicine

## 2010-06-11 NOTE — Telephone Encounter (Signed)
-----   Message -----    From: Hart Carwin, MD    Sent: 06/11/2010  12:53 PM      To: Vernia Buff, CMA Subject: O bentyl refill                                OK to give Bentyl # 30, 1 po bid prn abd. Pain, 1 refill. ----- Message -----    From: Vernia Buff, CMA    Sent: 06/11/2010  12:35 PM      To: Hart Carwin, MD  This patient requests refills of dicyclomine. I see where you said she could try @20  0 refills on 05/18/10 phone note. Do you want me to give refills?

## 2010-06-11 NOTE — Telephone Encounter (Signed)
Sunshine, what shall I do with this information?

## 2010-06-15 ENCOUNTER — Other Ambulatory Visit: Payer: Self-pay | Admitting: Internal Medicine

## 2010-06-15 ENCOUNTER — Other Ambulatory Visit: Payer: BC Managed Care – PPO

## 2010-06-15 DIAGNOSIS — R197 Diarrhea, unspecified: Secondary | ICD-10-CM

## 2010-06-16 LAB — CLOSTRIDIUM DIFFICILE BY PCR: Toxigenic C. Difficile by PCR: NOT DETECTED

## 2010-06-22 ENCOUNTER — Telehealth: Payer: Self-pay | Admitting: Internal Medicine

## 2010-06-22 NOTE — Telephone Encounter (Signed)
Patient calling because she is still having "intestinal cramping." States the pain is above her belly button. She is also having "bouts" of diarrhea and gas.States she has nausea when she eats so she is eating a bland diet. She is taking Dicyclomine without relief. She also wanted the results of her stool studies which were negative, patient was given the results. Please, advise

## 2010-06-22 NOTE — Telephone Encounter (Signed)
May increase Bentyl to qid,

## 2010-06-23 NOTE — Telephone Encounter (Signed)
Patient given Dr. Brodie's recommendation. 

## 2010-06-24 ENCOUNTER — Telehealth: Payer: Self-pay | Admitting: *Deleted

## 2010-06-24 MED ORDER — ROPINIROLE HCL 1 MG PO TABS: ORAL_TABLET | ORAL | Status: DC

## 2010-06-24 NOTE — Telephone Encounter (Signed)
Patient called and left voice message stating she called her pharmacy for a refill on the requip and was advised the refill was too early. She would like to know if Dr Artist Pais would increase the dose. She states the current dose is not working for her, and she is having to take hot showers at strange hours to get relief in to get a decent nights rest.

## 2010-06-25 NOTE — Telephone Encounter (Signed)
Call placed to patient at [phone], she was informed of Rx change.

## 2010-07-07 ENCOUNTER — Encounter: Payer: Self-pay | Admitting: Internal Medicine

## 2010-07-09 ENCOUNTER — Other Ambulatory Visit: Payer: Self-pay | Admitting: Internal Medicine

## 2010-07-09 MED ORDER — DICYCLOMINE HCL 20 MG PO TABS: ORAL_TABLET | ORAL | Status: DC

## 2010-07-09 NOTE — Telephone Encounter (Signed)
rx sent

## 2010-07-10 ENCOUNTER — Ambulatory Visit (INDEPENDENT_AMBULATORY_CARE_PROVIDER_SITE_OTHER): Payer: BC Managed Care – PPO | Admitting: Internal Medicine

## 2010-07-10 ENCOUNTER — Other Ambulatory Visit: Payer: Self-pay | Admitting: Internal Medicine

## 2010-07-10 ENCOUNTER — Encounter: Payer: Self-pay | Admitting: Internal Medicine

## 2010-07-10 ENCOUNTER — Telehealth: Payer: Self-pay | Admitting: Family Medicine

## 2010-07-10 ENCOUNTER — Ambulatory Visit (INDEPENDENT_AMBULATORY_CARE_PROVIDER_SITE_OTHER)
Admission: RE | Admit: 2010-07-10 | Discharge: 2010-07-10 | Disposition: A | Discharge status: Home / Self Care | Payer: BC Managed Care – PPO | Source: Ambulatory Visit | Attending: Internal Medicine | Admitting: Internal Medicine

## 2010-07-10 ENCOUNTER — Ambulatory Visit (HOSPITAL_BASED_OUTPATIENT_CLINIC_OR_DEPARTMENT_OTHER)
Admission: RE | Admit: 2010-07-10 | Discharge: 2010-07-10 | Disposition: A | Discharge status: Home / Self Care | Payer: BC Managed Care – PPO | Source: Ambulatory Visit | Attending: Internal Medicine | Admitting: Internal Medicine

## 2010-07-10 VITALS — BP 120/80 | HR 92 | Temp 97.9°F | Resp 18 | Wt 201.0 lb

## 2010-07-10 DIAGNOSIS — N83209 Unspecified ovarian cyst, unspecified side: Secondary | ICD-10-CM | POA: Insufficient documentation

## 2010-07-10 DIAGNOSIS — M5417 Radiculopathy, lumbosacral region: Secondary | ICD-10-CM | POA: Insufficient documentation

## 2010-07-10 DIAGNOSIS — K5900 Constipation, unspecified: Secondary | ICD-10-CM | POA: Insufficient documentation

## 2010-07-10 DIAGNOSIS — R52 Pain, unspecified: Secondary | ICD-10-CM

## 2010-07-10 DIAGNOSIS — R109 Unspecified abdominal pain: Secondary | ICD-10-CM | POA: Insufficient documentation

## 2010-07-10 DIAGNOSIS — Z9889 Other specified postprocedural states: Secondary | ICD-10-CM

## 2010-07-10 DIAGNOSIS — N949 Unspecified condition associated with female genital organs and menstrual cycle: Secondary | ICD-10-CM

## 2010-07-10 DIAGNOSIS — R110 Nausea: Secondary | ICD-10-CM

## 2010-07-10 DIAGNOSIS — R102 Pelvic and perineal pain: Secondary | ICD-10-CM

## 2010-07-10 LAB — URINALYSIS, ROUTINE W REFLEX MICROSCOPIC
Bilirubin Urine: NEGATIVE
Glucose, UA: NEGATIVE mg/dL
Ketones, ur: NEGATIVE mg/dL
Specific Gravity, Urine: 1.006 (ref 1.005–1.030)
Urobilinogen, UA: 0.2 mg/dL (ref 0.0–1.0)
pH: 6 (ref 5.0–8.0)

## 2010-07-10 LAB — CBC WITH DIFFERENTIAL/PLATELET
HCT: 41.6 % (ref 36.0–46.0)
Hemoglobin: 13.9 g/dL (ref 12.0–15.0)
Lymphocytes Relative: 55 % — ABNORMAL HIGH (ref 12–46)
Lymphs Abs: 3.9 10*3/uL (ref 0.7–4.0)
Monocytes Absolute: 0.7 10*3/uL (ref 0.1–1.0)
Monocytes Relative: 10 % (ref 3–12)
Neutro Abs: 2.4 10*3/uL (ref 1.7–7.7)
Neutrophils Relative %: 33 % — ABNORMAL LOW (ref 43–77)
RBC: 4.4 MIL/uL (ref 3.87–5.11)

## 2010-07-10 LAB — BASIC METABOLIC PANEL WITH GFR
BUN: 13 mg/dL (ref 6–23)
CO2: 27 mEq/L (ref 19–32)
Chloride: 104 mEq/L (ref 96–112)
GFR, Est African American: >60 mL/min (ref >60)
Glucose, Bld: 102 mg/dL — ABNORMAL HIGH (ref 70–99)
Potassium: 4.2 mEq/L (ref 3.5–5.3)
Sodium: 139 mEq/L (ref 135–145)

## 2010-07-10 MED ORDER — IOHEXOL 300 MG/ML  SOLN: 100.0000 mL | Freq: Once | INTRAMUSCULAR | Status: DC | PRN

## 2010-07-10 MED ORDER — IOHEXOL 300 MG/ML  SOLN: 100.0000 mL | Freq: Once | INTRAMUSCULAR | Status: AC | PRN

## 2010-07-10 NOTE — Op Note (Signed)
   NAME:  [name] [name], [name] [name]                          ACCOUNT NO.:  [id number]   MEDICAL RECORD NO.:  [id number]                   PATIENT TYPE:  AMB   LOCATION:  SDC                                  FACILITY:  [facility]   PHYSICIAN:  Malva Limes, M.D.                 DATE OF BIRTH:  04-Nov-1965   DATE OF PROCEDURE:  11/13/2002  DATE OF DISCHARGE:                                 OPERATIVE REPORT   PREOPERATIVE DIAGNOSIS:  Menorrhagia.   POSTOPERATIVE DIAGNOSIS:  Menorrhagia.   OPERATION PERFORMED:  1. Dilation and curettage.  2. Cryoablation of the  endometrium.   SURGEON:  Malva Limes, M.D.   ANESTHESIA:  MAC with paracervical block.   DRAINS:  None.   ESTIMATED BLOOD LOSS:  25mL.   SPECIMENS:  Endometrial curettings sent to pathology.   COMPLICATIONS:  None.   ANTIBIOTICS:  Ancef 1g.   DESCRIPTION OF PROCEDURE:  The patient was taken to the operating room where  she was placed in dorsal supine position and MAC anesthesia was administered  without complications.  She was then placed in dorsal lithotomy position.  She was prepped with Hibiclens.  A sterile speculum was placed in the  vagina.  10mL of 1% lidocaine was used for a paracervical block.  The cervix  was serially dilated to a 5 [place].  The uterus was sounded to 11 cm.  Sharp curettage was then performed with copious amounts of tissue being  removed.  At this point the cryo machine was set up and the right cornua  frozen for seven minutes followed by the left cornua being frozen for seven  minutes.  The lower uterine segment was then frozen for four minutes.  The  patient tolerated the procedure well.  She was taken to recovery room in  stable condition.  She will be discharged to home.  She will be instructed  to follow up in the office in four weeks.                                               Malva Limes, M.D.    MA/MEDQ  D:  11/13/2002  T:  11/14/2002  Job:  [id number]

## 2010-07-10 NOTE — Op Note (Signed)
NAME:  Grigoryan, Tekela                ACCOUNT NO.:  [id number]   MEDICAL RECORD NO.:  [id number]          PATIENT TYPE:  OIB   LOCATION:  1606                         FACILITY:  [facility]   PHYSICIAN:  Malva Limes, M.D.    DATE OF BIRTH:  October 10, 1965   DATE OF PROCEDURE:  01/24/2006  DATE OF DISCHARGE:                               OPERATIVE REPORT   PREOPERATIVE DIAGNOSIS:  Chronic pelvic pain.   POSTOPERATIVE DIAGNOSES:  1. Chronic pelvic pain.  2. Extensive endometriosis.  3. Hematometra.  4. Likely adenomyosis.   OPERATION PERFORMED:  1. Laparoscopically assisted vaginal hysterectomy.  2. Cauterization of multiple endometriosis implants.   SURGEON:  Malva Limes, M.D.   ASSISTANT:  Luvenia Redden, M.D.   ANESTHESIA:  General endotracheal.   ANTIBIOTICS:  Ancef 1 g.   DRAINS:  Foley catheter to bedside drainage.   ESTIMATED BLOOD LOSS:  200 mL.   COMPLICATIONS:  None.   SPECIMENS:  Cervix and uterus sent to pathology.   DESCRIPTION OF PROCEDURE:  The patient was taken to the operating room  where she was placed in dorsal supine position.  A general anesthetic  was administered without complications.  She then was prepped and draped  in the usual fashion for this procedure.  A red rubber catheter was used  to drain the bladder.  A Hulka tenaculum was applied to the anterior  cervix lip.  At this point the umbilicus was grasped.  The vertical skin  incision was made.  This was carried down to the fascia.  The fascia was  grasped with the Kochers, entered sharply.  Parietal peritoneum was  entered bluntly.  0 Monocryl suture was placed in a pursestring fashion.  The Hasson cannula was then placed into the peritoneal cavity.  The  patient was placed in Trendelenburg.  The scope was then placed.  On  examination the patient had extensive endometriosis involving the  anterior cul-de-sac and lateral pelvic sidewall.  There was minimal  endometriosis in the posterior  cul-de-sac.  The ovaries appeared to be  normal bilaterally.  There was evidence of past cesarean sections and  tubal ligation.  Once this was discovered, the patient had 5 mm ports  placed in her right and left lower quadrants under direct visualization.  The bipolar device was used to cauterize the multiple areas of  endometriosis.  Following this, the round ligament on the left was  isolated and transected with the tripolar device.  The ovarian ligament  was then cauterized and transected with the tripolar device.  The  patient had previous evidence of tubal ligation and therefore the tube  was previously cauterized and cut.  Once the level of the uterine vessel  was reached, the dissection the left side was completed.  The bladder  flap had been taken down sharply.  A similar procedure was performed on  the opposite site.  The right side had more extensive endometriosis in  the area that the patient had the pain.  Once this was accomplished,  attention was turned to the vagina.  The posterior cul-de-sac was  entered sharply.  Uterosacral ligaments were bilateral clamped, cut and  ligated with 0 Monocryl suture.  The cervix was then circumscribed.  The  cardinal ligaments were bilaterally clamped, cut and ligated with 0  Monocryl suture.  Uterine vessels were bilaterally clamped, cut and  ligated with 0 Monocryl suture.  There was some difficulty dissecting  into the anterior cul-de-sac. This was done eventually.  The uterus was  then flipped and the remaining broad leaf pedicle bilaterally clamped,  cut and ligated with 0 Monocryl suture.  The specimen was removed and  sent to pathology.  All pedicles were then checked and found to be  hemostatic.  The posterior cuff was then run using 2-0 Vicryl in running  locking fashion.  The remaining cuff was closed using 2-0 Vicryl in a  running locking fashion. The cuff was closed vertically.  Attention was  then turned to the abdominal cavity.   Again, this was reinsufflated.  The pedicles were all checked and found to be hemostatic.  There was one  more area of endometriosis on the right pelvic sidewall which was  cauterized.  Following this, the pneumoperitoneum was released.  The  bleeding again checked and no new bleeding discovered.  The trocars were  removed.  The fascia was closed with 0 Monocryl suture and the skin with  4-0 Vicryl suture.  The 5 mm ports were closed using Dermabond.  The  patient was extubated and taken to recovery room in stable condition.  She will be taken to the room following this.           ______________________________  Malva Limes, M.D.     MA/MEDQ  D:  01/24/2006  T:  01/25/2006  Job:  [id number]

## 2010-07-10 NOTE — Discharge Summary (Signed)
[place]  Patient:    Bartley, CAILAH REACH Visit Number: [id number] MRN: [id number]          Service Type: MED Location: [phone] Attending Physician:  Hinda Glatter Dictated by:   Lester Kinsman, M.D. Admit Date:  08/18/2001 Discharge Date: 08/20/2001   CC:         Madison Hickman, M.D.  Armstead Peaks, M.D.   Discharge Summary  DATE OF BIRTH:  [birth year]  BRIEF HISTORY OF PRESENT ILLNESS: The patient is a 45 year old white female with a past medical history that was significant for depression who was admitted by Dr. Armstead Peaks on August 18, 2001 for complaint of abdominal pain. She described the pain as right sided, continuous and associated with both nausea and vomiting.  She was admitted for observation and further workup and evaluation.  She had an ultrasound done which showed two gallstones, but without any evidence of cholecystitis or bile duct obstruction.  Her laboratory values showed no evidence of cholelithiasis as well.  CBC:  Her white count was a little bit low at 3.7, platelets 147,000, hemoglobin 12.9. Chemistries were normal.  AST, ALT, alkaline phosphatase and total bilirubin were also normal. Lipase was 16.  HOSPITAL COURSE: During her admission course she had gradual relief of her abdominal pain symptoms, but still has some nausea.  Her vital signs remained normal and she had no evidence of fever or other complaints.  DISCHARGE MEDICATIONS: 1. Paxil 40 mg p.o. q.d. 2. Ibuprofen p.r.n.  DISCHARGE INSTRUCTIONS:  She will follow-up with Dr. Daphine Deutscher who may want to consider referring her to a surgeon as an outpatient to ascertain whether the gallstones are having a correlation with the patients abdominal discomfort and nausea.  She will also be given a prescription for some Phenergan and a proton pump inhibitor as well. Dictated by:   Lester Kinsman, M.D. Attending Physician:  Hinda Glatter DD:   08/20/01 TD:  08/21/01 Job: [id number]

## 2010-07-10 NOTE — Assessment & Plan Note (Signed)
[facility]                                   ON-CALL NOTE   NAME:Vanriper, KODEE DRURY                       MRN:          [id number]  DATE:12/25/2005                            DOB:          Jul 05, 1965    Ms. Moist calls today stating that she had been having left lower pelvic  pain.  She states that she has a history of uterine fluid that is followed  by her gynecologist.  She states that she was not able to contact them.  She  has been having significant discomfort, and it is very intense.  Additionally, she complains of heartburn with chest tightness.  She has a  history of esophageal reflux disease and esophageal spasm.  She is currently  on Protonix, which has not relieved her symptoms.  This also started last  night.  She reports that she also took some nitroglycerin sublingual with no  improvement in her symptoms.  She states that this has worked before for her  esophageal spasm.   PLAN:  Given the above symptoms without relief, I advised the patient that  she needs to be seen urgently at the emergency department.  I recommended  EMS and 911, but the patient states that she will be able to have someone  drive her.  She feels that she is stable enough to be transferred by one of  her acquaintances.    ______________________________  Leanne Chang, M.D.    LA/MedQ  DD: 12/25/2005  DT: 12/26/2005  Job #: [id number]   cc:   Barbette Hair. Artist Pais, DO

## 2010-07-10 NOTE — Cardiovascular Report (Signed)
NAME:  [name] [name], [name] [name]                ACCOUNT NO.:  [id number]   MEDICAL RECORD NO.:  [id number]          PATIENT TYPE:  OIB   LOCATION:  1966                         FACILITY:  [facility]   PHYSICIAN:  Rollene Rotunda, M.D.   DATE OF BIRTH:  1965-07-24   DATE OF PROCEDURE:  DATE OF DISCHARGE:  08/02/2005                              CARDIAC CATHETERIZATION   PRIMARY CARE PHYSICIAN:  None.   PROCEDURE:  Left heart catheterization/coronary arteriography.   INDICATIONS:  Patient with chest pain.  Of note, the patient was initially  scheduled for a right and left heart catheterization; however, in discussing  this with her and knowing her from a previous hospitalization, I do not see  an indication for a right heart catheterization.  She does not have any  overt signs and symptoms consistent with elevated pulmonary pressures,  abnormal right heart pressures or shunt.   PROCEDURE NOTE:  Left heart catheterization was performed via the right  femoral artery.  The artery was cannulated using anterior wall puncture.  A  #4 French arterial sheath was inserted via the modified Seldinger technique.  A preformed Judkins and pigtail catheter were utilized.  The patient  tolerated the procedure well and left the lab in stable condition.   RESULTS:   HEMODYNAMICS:  LV 106/16, AO 103/80.   CORONARIES:  Left main was normal.  The LAD was normal.  There was a first  diagonal which was large and normal.  The second diagonal was small and  normal.  The circumflex in the AV groove was normal.  There was a mid obtuse  marginal which was large and normal.  It was branching.  The right coronary  artery is a dominant vessel and normal throughout its course.  The PDA was  moderate sized and normal.  The posterolateral was small and normal.   LEFT VENTRICULOGRAM:  The left ventriculogram is obtained in the RAO  projection.  The EF was 65% and normal.   CONCLUSION:  Normal coronaries.  Normal left ventricular  function.   PLAN:  No further cardiac workup is suggested.  The patient will follow with  new primary care doctor for further evaluation of nonanginal chest pain.           ______________________________  Rollene Rotunda, M.D.     JH/MEDQ  D:  08/02/2005  T:  08/02/2005  Job:  [id number]

## 2010-07-10 NOTE — Assessment & Plan Note (Signed)
[facility]                           GASTROENTEROLOGY OFFICE NOTE   NAME:[name], [name] HANSMANN                       MRN:          [id number]  DATE:10/12/2005                            DOB:          10-06-1965    [name] [name] is a very nice, 45 year old, white female here to evaluate her for  myocardial chest pain.  In May of this year, she had an attack of severe  chest pain requiring hospitalization.  Cardiology workup was negative.  The  pain was relieved by nitroglycerin.  Cardiac enzymes were negative.  She is  a smoker using one pack of cigarettes a day.  She has a history of  depression and was started on Celexa without any relief of the chest pain  which continues to bother her not on every day basis, but quite often.  It  may last an hour or several hours.  It initially woke her up from sleep, but  currently does not wake her up from sleep and it occurs some during the day.  It seems to be worse when she is stressed out.  It is not associated with  eating.  There is no dysphagia or odynophagia.  She has no history of  gastroesophageal reflux disease.  She denies heartburn or food  regurgitation.  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  She had flexible sigmoidoscopy which was normal in  1993, and she had a colonoscopy in March 2004, which showed benign polyps  and internal hemorrhoids.  She had a cholecystectomy in 2002, after one  attack of abdominal pain.  She has a family history of esophageal stricture  and gastroesophageal reflux in her mother who needs esophageal dilatation.  After negative cardiac workup in May 2007, the patient was started on  Protonix 40 mg a day which she took x4 weeks.  Her insurance did not  authorize her b.i.d. dosage, so she has been only on one a day.  She has not  appreciated any difference in taking two a day or one a day and, in fact,  the chest pain has continued unchanged  in presence of proton pump  inhibitors.  The pain is not respiratory, although she feels there is a  heaviness and she has to take a deep breath.  It is not associated with  physical exercise or positional change.  She does not use any NSAIDs or  aspirin and she does not drink alcohol other than socially.  She has a  significant caffeine intake.  She drinks up to three Dr. Alcus Dad a day and  iced tea.   PHYSICAL EXAMINATION:  VITAL SIGNS:  Blood pressure 124/70, pulse 60, weight  196 pounds.  GENERAL:  She was alert, oriented and in no distress.  LUNGS:  Clear to auscultation with no wheezes or rales.  CARDIAC:  Normal S1, S2.  Sternum did not appear to be tender.  Costochondral junctions were normal.  On inspiration, there was no rub.  Precordium was quiet with normal S1, S2.  ABDOMEN:  Soft,  nontender.  Normoactive bowel sounds.  Post laparoscopic  cholecystectomy scar seen in right upper quadrant.  Liver edge at costal  margin.  Lower abdomen was normal.  EXTREMITIES:  No edema.   IMPRESSION:  A 45 year old, white female with noncardiac chest pain located  in anterior chest which does not have typical features or reflux and has not  responded to proton pump inhibitors.  Nevertheless, the initial episode of  pain did wake the patient up at night which raises a question of acid or  bile reflux.  Since she had a previous cholecystectomy, she is at risk of  having reflux gastritis and possible bile reflux.  The other possibly  explanation for chest pain could be stress or possible esophageal spasm.  She drinks a lot of caffeine and smokes and this could precipitate  esophageal spasm as well as increase her acid reflux.   PLAN:  1. Upper endoscopy with appropriate biopsies.  2. Continue Protonix 40 mg a day.  3. Decrease caffeine intake and smoking.  4. Trial of Xanax 0.25 mg p.r.n. chest pain to see if the Xanax helps to      relax the smooth muscles of the esophagus or the chest  wall to see if      there is any benefit in relieving her discomfort.                                   Hedwig Morton. Juanda Chance, MD   DMB/MedQ  DD:  10/12/2005  DT:  10/12/2005  Job #:  [id number]   cc:   Barbette Hair. Artist Pais, DO

## 2010-07-10 NOTE — Discharge Summary (Signed)
NAME:  Claflin, Grover                ACCOUNT NO.:  [id number]   MEDICAL RECORD NO.:  [id number]          PATIENT TYPE:  INP   LOCATION:  1606                         FACILITY:  [facility]   PHYSICIAN:  Malva Limes, M.D.    DATE OF BIRTH:  02-01-1966   DATE OF ADMISSION:  01/24/2006  DATE OF DISCHARGE:  01/26/2006                               DISCHARGE SUMMARY   DISCHARGE DIAGNOSES:  1. Chronic pelvic pain.  2. Endometriosis.  3. Hematometra.   PRINCIPAL PROCEDURE:  1. Laparoscopic-assisted vaginal hysterectomy.  2. Cauterization of endometriosis.   HISTORY OF PRESENT ILLNESS:  Mrs. [name] [name] is a 45 year old, gravida 3, para  2, white female who presented to [facility] for vaginal  hysterectomy secondary to history of worsening pelvic pain.  The patient  did have a history of dysmenorrhea and menorrhagia.  To treat this, she  underwent an ablation.  Postoperatively the patient did well until she  developed hematometra. There was an attempt to dilate her cervix in the  office without success.  The patient was given the option to be taken to  the operating room for dilation and curettage.  However, she wished to  proceed with vaginal hysterectomy.  Prior to this, the patient was to  undergo a laparoscopy to identify any other sources of pain.   HOSPITAL COURSE:  The patient underwent a laparoscopy at which time she  was found to have several areas of endometriosis, most involving the  right pelvic sidewall.  These were all treated with a laparoscope and  cautery.  At that point she underwent a laparoscopic-assisted vaginal  hysterectomy.  A complete description of this can be found in the  operative note.  The patient's postoperative course was complicated by  significant pain requiring Dilaudid and Percocet.  The patient was  discharged to home on postoperative day #2 at which time she was eating  a regular diet and ambulating without difficulty.  The patient was  instructed to  follow up in the office in four weeks.  She was told to  call the office if she developed any fever or vaginal bleeding.  The  patient will be sent home with Percocet and Advil.           ______________________________  Malva Limes, M.D.     MA/MEDQ  D:  01/26/2006  T:  01/26/2006  Job:  [id number]

## 2010-07-10 NOTE — H&P (Signed)
. [facility]  Patient:    [name] [name], BASILIA STUCKERT Visit Number: [id number] MRN: [id number]          Service Type: MED Location: [phone] Attending Physician:  Hinda Glatter Dictated by:   Armstead Peaks, M.D. Admit Date:  08/18/2001 Discharge Date: 08/20/2001                           History and Physical  DATE OF BIRTH:  Mar 06, 1965  CHIEF COMPLAINT:  Abdominal pain.  HISTORY OF PRESENT ILLNESS:  A 45 year old female was in her usual state of health until three days ago when she developed abdominal pain.  She states that since then she has had continuous abdominal pain in the mid epigastric as well as right side of her abdomen.  It is a dull pain with associated nausea and dizziness.  She has had a decreased appetite as well as decreased p.o. intake since then, although she was able to tolerate some p.o. intake last night.  She has been taking ibuprofen 200 mg approximately six times a day for relief.  She denies fevers, chills, chest pain, radiation of the pain to her back, vomiting, diarrhea, dysuria, hematuria, abnormal vaginal discharge, or change in her bowel habits.  She does report mild dyspnea on exertion.  She is admitted for further evaluation and treatment.  PAST MEDICAL HISTORY: 1. Depression. 2. Plantar fasciitis. 3. Fractured tail bone.  CURRENT MEDICATIONS: 1. Paxil 40 mg p.o. q.d. 2. Ibuprofen as needed.  ALLERGIES:  PENICILLIN.  PAST SURGICAL HISTORY: 1. Tubal ligation. 2. Cesarean section x2. 3. Wrist surgery.  FAMILY HISTORY:  Mother with COPD, GERD, and RSD.  Sister has RSV. Grandfather has colon cancer.  SOCIAL HISTORY:  The patient is married with children.  She smokes, but does not consume alcohol.  REVIEW OF SYSTEMS:  Per above, otherwise all other review of systems negative.  PHYSICAL EXAMINATION  VITAL SIGNS:  Blood pressure 92/64, pulse 84, respirations 12, temperature 97.7.  GENERAL:   Well-developed, well-nourished young female in no acute distress, but uncomfortable in appearance.  HEENT:  Pupils equally responsive and reactive to light.  Extraocular movements are intact.  Non-icteric sclerae.  Tympanic membranes clear.  Nares clear.  Oral cavity and oropharynx clear.  No masses or lesions noted.  NECK:  Supple.  No adenopathy.  No carotid bruits.  No thyromegaly.  No increased jugular venous distention.  CARDIOVASCULAR:  Regular rate and rhythm.  S1, S2 without murmur, rub, or gallop.  LUNGS:  Clear to auscultation bilaterally.  ABDOMEN:  Bowel sounds present, soft, nondistended.  There was mid epigastric and right upper quadrant tenderness.  No rebound appreciated.  No hepatosplenomegaly appreciated.  No palpable masses.  No bruits.  RECTAL:  Deferred.  EXTREMITIES:  no clubbing, cyanosis, edema.  Peripheral pulses 2+.  NEUROLOGIC:  Cranial nerves II-XII intact.  Upper extremity/lower extremity strength 5/5 bilaterally.  DTRs 2+.  IMPRESSION AND PLAN:  A 45 year old female is admitted with three day history of continuous mid epigastric and right upper quadrant tenderness with associated nausea and dizziness.  Differential diagnoses includes cholecystitis, peptic ulcer disease, or gastritis with her history of nonsteroidal anti-inflammatory use or less likely would be pancreatitis or possible low grade viral infection.  PLAN: 1. The patient will be admitted to [facility] regular bed. 2. GI:  We will check a complete metabolic panel, CBC with differential, and    lipase  for further evaluation.  She will have an abdominal flat and upright    x-ray for further evaluation as well as a right upper quadrant ultrasound    to rule out cholecystitis.  She will be given IV fluid hydration with    normal saline at 125 cc/hour.  She will have her pain controlled with    morphine IV prn. 3. The patient may be discharged in the next 24-48 hours. 4. Further management  will pend the results of the test. Dictated by:   Armstead Peaks, M.D. Attending Physician:  Hinda Glatter DD:  08/18/01 TD:  08/21/01 Job: [id number]

## 2010-07-10 NOTE — Telephone Encounter (Signed)
Received call from Dr Darl Pikes regarding pt's CT scan earlier today.  Pt had recent gynecologic surgery with bladder injury.  Few day hx of some nonspecific lower abdominal pain.  No fever.  CT showed "prominent appendix" with mild wall thickening but no surrounding inflammation.  Spoke with pt by phone and she does not have any known fever.  UA and WBC earlier today normal.  No nausea or vomiting.  I have instructed pt to go promptly to ER for any fever, worsening abdominal pain, or nausea/vomiting.  I have also given her option of recheck tomorrow AM in [facility] morning walk in clinic.

## 2010-07-10 NOTE — Op Note (Signed)
NAME:  [name] [name], [name] [name]                          ACCOUNT NO.:  [id number]   MEDICAL RECORD NO.:  [id number]                   PATIENT TYPE:  AMB   LOCATION:  SDC                                  FACILITY:  [facility]   PHYSICIAN:  Malva Limes, M.D.                 DATE OF BIRTH:  01/05/1966   DATE OF PROCEDURE:  09/05/2003  DATE OF DISCHARGE:                                 OPERATIVE REPORT   PREOPERATIVE DIAGNOSES:  1. Menorrhagia.  2. Failed cryoablation of the endometrium.   POSTOPERATIVE DIAGNOSES:  1. Menorrhagia.  2. Failed cryoablation of the endometrium.   PROCEDURE:  Hysteroscopy with resection and rollerball of endometrial  cavity.   SURGEON:  Malva Limes, M.D.   ANESTHESIA:  General anesthesia.   ANTIBIOTICS:  Ancef 1 g.   ESTIMATED BLOOD LOSS:  20 mL.   COMPLICATIONS:  None.   SPECIMENS:  None.   DRAINS:  Red rubber catheter to the bladder.   FLUID DEFICIT:  70 mL.   DESCRIPTION OF PROCEDURE:  The patient was taken to the operating room where  she was placed in the dorsal supine position.  A general anesthetic was  administered without complications.  She was then placed in the dorsal  lithotomy position.  She was prepped with Hibiclens.  Her bladder was  drained with a red rubber catheter.  She was then draped in the usual  fashion for this procedure.  A single-tooth tenaculum was applied to the  anterior cervical lip.  The cervical os was then serially dilated to a 31  [place].  The diagnostic hysteroscope was then advanced through the  endocervical canal which appeared to be normal.  On entering the  intrauterine cavity, there was no evidence of any uterine polyps or  fibroids.  Following this, the resectoscope was set up and placed into the  uterine cavity and the double Loop was used to resect the endometrial  cavity.  One this was accomplished, the rollerball was used to coagulate the  entire uterine cavity and upper endocervical canal.  The patient  tolerated  the procedure well.  Fluid deficit was 70 mL.  The patient was taken to the recovery room in  stable condition.  She will be discharged to home.  She will sent home with  Keflex 500 mg q.i.d. for two days and Darvocet to take p.r.n.  She will  follow up in the office in four weeks.                                               Malva Limes, M.D.    MA/MEDQ  D:  09/05/2003  T:  09/05/2003  Job:  [id number]

## 2010-07-10 NOTE — Op Note (Signed)
TNAME:  Knauff, BRENAE LASECKI                         ACCOUNT NO.:  [id number]   MEDICAL RECORD NO.:  [id number]                   PATIENT TYPE:  OUT   LOCATION:  XRAY                                 FACILITY[facility]   PHYSICIAN:  Timothy E. Earlene Plater, M.D.              DATE OF BIRTH:  Jul 03, 1965   DATE OF PROCEDURE:  10/10/2001  DATE OF DISCHARGE:  10/05/2001                                 OPERATIVE REPORT   PREOPERATIVE DIAGNOSES:  Acute episodes of chronic cholecystolithiasis.   POSTOPERATIVE DIAGNOSES:  Acute episodes of chronic cholecystolithiasis.   PROCEDURE:  Laparoscopic cholecystectomy and operative cholangiogram.   SURGEON:  Kendrick Ranch, M.D.   ASSISTANT:  Gita Kudo, M.D.   ANESTHESIA:  CRNA.   INDICATIONS FOR PROCEDURE:  Ms. Kilmer is and otherwise healthy and obese  Caucasian female age 45 with two months of increasing episodes of right  upper quadrant pain typically post prandial, one hospitalization. Ultrasound  demonstrates gallstones. Today she has slight elevation of the SGOT. She has  consented to proceed with cholecystectomy. She was identified, correct op  permit signed in the preop area.   She was taken to the operating room, placed supine, general endotracheal  anesthesia administered. The abdomen was prepped and draped in the usual  fashion. Marcaine 0.5% with epinephrine was used prior to each incision. An  infraumbilical incision made, the fascia identified, opened in the midline,  peritoneum entered without complications. The Hasson catheter placed, tied  in place with a #1 Vicryl. CO2 gas was applied, pneumoperitoneum created and  then gentle peritoneoscopy revealed some adhesions in the lower midline and  a thickened gallbladder. A second 10 mm trocar placed in the mid  epigastrium, two 5 mm trocars in the right upper quadrant. The gallbladder  was grasped, placed on tension, careful dissection at the base of the  gallbladder revealed a normal cystic  structure entering the gallbladder.  Behind that were two branches of the cystic artery. A complete window was  created, the two branches of the cystic artery were triply clipped and  divided. A clip was placed on the gallbladder side in the cystic duct, the  cystic duct was opened. The Reddick catheter introduced percutaneously and  then using real time fluoroscopy injection of 1/2 strength Hypaque showed  smooth and full opacification of the common bile duct with swift emptying  into the duodenum. Likewise the common hepatic and hepatic radicles filled.  The radiologist agreed. The catheter was removed, the cystic duct stump  triply clipped and fully divided. The gallbladder was then removed from the  gallbladder bed without incident or complication. The gallbladder bed was  carefully inspected and irrigated and there was no bleeding or bile leak.  The gallbladder was then removed through the infraumbilical incision which  was then closed with a #1 Vicryl. This was done under direct vision. Copious  irrigation was carried out until clear  and then all irrigant and CO2, instruments and trocars removed under direct  vision. Counts were correct, she tolerated it well, skin incisions closed  with Monocryl and Steri-Strips. A dry sterile dressing applied. She was  awakened and taken to the recovery room in good condition.                                               Timothy E. Earlene Plater, M.D.    TED/MEDQ  D:  10/10/2001  T:  10/10/2001  Job:  [id number]   cc:   Darius Bump, M.D.

## 2010-07-10 NOTE — H&P (Signed)
NAME:  [name] [name], [name] [name]                ACCOUNT NO.:  [id number]   MEDICAL RECORD NO.:  [id number]          PATIENT TYPE:  INP   LOCATION:  1829                         FACILITY:  [facility]   PHYSICIAN:  Kela Millin, M.D.DATE OF BIRTH:  Jun 26, 1965   DATE OF PROCEDURE:  DATE OF DISCHARGE:                      STAT - MUST CHANGE TO CORRECT WORK TYPE   PRIMARY CARE PHYSICIAN:  She used to see Dr. Madison Hickman, but has not  seen her in three years.   CHIEF COMPLAINT:  Chest pain.   HISTORY OF PRESENT ILLNESS:  The patient is a 45 year old white female with  past medical history significant for tobacco abuse who presents with  complaints of chest pain.  She states that she was awakened by chest pain at  about 2:30 this morning, and she describes it as a pressure (like a 10 pound  truck sitting on her chest).  Also, she states that it was 10/10 in  intensity, mid sternal, nonradiating, associated with nausea and shortness  of breath.  She denies diaphoresis.  The patient reports that she got some  relief after she was given nitroglycerin and aspirin in the ER.  She denies  cough, fevers, dysuria, diarrhea, hematochezia, vomiting and no hemoptysis.   The patient was seen in the ER, and an EKG was done which showed a normal  sinus rhythm with no ischemic changes.  Her point of care markers were  negative.  A chest x-ray showed no acute pulmonary process.  She is admitted  to the [facility] service for evaluation and management.   PAST MEDICAL HISTORY:  1.  As above.  2.  History of menorrhagia.  3.  History of plantar fasciitis.   MEDICATIONS:  None.   ALLERGIES:  Penicillin.   SOCIAL HISTORY:  Positive for tobacco, one pack per day.  Occasional  alcohol.   FAMILY HISTORY:  Grandfather had an MI at an unknown age.  Mother had stroke  and also had a pacemaker placed.   REVIEW OF SYSTEMS:  As per HPI.  Other review of systems negative.   PHYSICAL EXAMINATION:  GENERAL  APPEARANCE:  The patient is an overweight,  middle aged, white female in no apparent distress.  VITAL SIGNS:  Blood pressure 97/62, initially 103/59, temperature 97.7,  pulse 74, respirations 20, O2 saturation 98%.  HEENT:  PERRL.  EOMI.  Sclera anicteric.  Slightly dry mucous membranes.  NECK:  Supple with no adenopathy.  No thyromegaly.  LUNGS:  Clear to auscultation bilaterally.  No crackles or wheezes.  CARDIOVASCULAR:  Regular rate and rhythm.  Normal S1, S2.  ABDOMEN:  Soft.  Mild bowel sounds present.  Nontender, nondistended.  No  organomegaly and no masses palpable.  EXTREMITIES:  No cyanosis or edema.  NEUROLOGICAL:  Alert and oriented x3.  Cranial nerves II-XII grossly intact.   LABORATORY DATA:  Chest x-ray no acute pulmonary process.  Sodium 139,  potassium 3.6, chloride 107, BUN 10, glucose 111, creatinine 0.9, pH 7.40,  PCO2 37.7.  [name] cell count is 11.9, hemoglobin 12.3, hematocrit 36,  platelets 168,000.  D. dimer is less than  0.22. Urine drug screen is  positive for opiates.   ASSESSMENT/PLAN:  1.  Chest pain.  As discussed above, the patient is a 45 year old,      overweight white female, smoker, presenting with chest pain relieved by      nitroglycerin.  Will obtain serial cardiac enzymes.  Switch to      nitroglycerin then aspirin.  Consult cardiology for possible stress      test.  2.  Decreased blood pressure.  Monitor and treat as appropriate.  3.  Tobacco abuse.  The patient counseled to quit, smoking cessation      consult.      Kela Millin, M.D.  Electronically Signed     ACV/MEDQ  D:  07/16/2005  T:  07/16/2005  Job:  [id number]   cc:   Darius Bump, M.D.  Fax: [phone]

## 2010-07-10 NOTE — Progress Notes (Signed)
  Subjective:    Patient ID: [name] [name], female    DOB: Sep 24, 1965, 45 y.o.   MRN: [id number]  HPI  Pt reports chronic right pelvic pain since surgery.    Pt feels significant pressure when she tried to urinate.      Review of Systems     Objective:   Physical Exam        Assessment & Plan:

## 2010-07-10 NOTE — Consult Note (Signed)
NAME:  [name] [name], [name] [name]                ACCOUNT NO.:  [id number]   MEDICAL RECORD NO.:  [id number]          PATIENT TYPE:  INP   LOCATION:  3705                         FACILITY:  [facility]   PHYSICIAN:  Rollene Rotunda, M.D.   DATE OF BIRTH:  [birth year]   DATE OF CONSULTATION:  07/16/2005  DATE OF DISCHARGE:                                   CONSULTATION   PRIMARY CARE PHYSICIAN:  The patient says she has seen _____________Martin  in the past, the last time about two or three years ago.  She is being  admitted by Dr. Suanne Marker of [facility], and she is the  requesting physician.   PRIMARY CARDIOLOGIST:  [facility].  Will be seen by Dr. Antoine Poche.   PATIENT PROFILE:  A 45 year old white female with no prior history of CAD  who presented with chest pain.   PROBLEM LIST:  1.  Chest pain.  2.  Depression.  3.  Plantar fasciitis.  4.  Status post laparoscopic cholecystectomy on October 10, 2001.  5.  History of menorrhagia, status post endometrial cryoablation in      September 2004, with subsequent hysteroscopy and endometrial cavity      resection in 2005.  6.  Irritable bowel syndrome.  7.  Tobacco abuse, 23 pack years, currently one pack per day.   HISTORY OF PRESENT ILLNESS:  A 45 year old white female with no prior  history of CAD.  She does have a history of tobacco abuse as described  above.  She has had a lot of stress at work recently, but otherwise has been  doing well.  She was in her usual state of health until this a.m., at 1  a.m., when she awoke with 9/10 substernal chest pressure associated with  shortness of breath and nausea.  She then got out of bed and walked around  and developed lightheadedness, prompting her to crawl on the floor into her  bathroom where she laid on the floor and felt somewhat better after  approximately 45 minutes.  Notably, walking also caused her chest pressure  to worsen.  After she had laid down for about 45 minutes, she got  back up to  walk back to bed, and once she laid back in bed she had recurrent chest  pressure and shortness of breath.  She eventually called EMS, and following  their arrival, she was given one sublingual nitroglycerin with reduction of  chest pain from 10/10 to approximately 2/10, where she has remained since  approximately 2:30 this morning.  She is otherwise comfortable.  She denies  any PND, orthopnea, dizziness, syncope, edema, or palpitations.  Her chest  pain is not reproducible with palpation, deep breathing, or  positioning/laying flat.   ALLERGIES:  PENICILLIN.   HOME MEDICATIONS:  None.   ADMISSION MEDICATIONS:  1.  Aspirin 325 mg daily.  2.  Protonix 40 mg daily.  3.  Phenergan p.r.n.  4.  Xanax p.r.n.  5.  Nitroglycerin p.r.n.   FAMILY HISTORY:  Mother is age 80, status post CVA.  She also has a  permanent pacemaker.  She does not know anything about her father's medical  history, but knows that he is alive.  She has three brothers and one half-  sister, all alive and well.   SOCIAL HISTORY:  [place] two children.  She is divorced.  She works as a Company secretary.  She has a 23 pack year  history of tobacco abuse, and continues to smoke one pack per day.  She is  not motivated to quit.  She rarely will have an alcoholic beverage, and  denies any drug use.  She does not routinely exercise.   REVIEW OF SYSTEMS:  Positive for chest pain, shortness of breath,  lightheadedness, depression, alternating constipation and diarrhea, nausea  that occurred earlier this morning.  All other systems reviewed and  negative.   PHYSICAL EXAMINATION:  VITAL SIGNS:  Temperature 97.7, heart rate 85,  respirations 20, blood pressure 99/70, pulse ox 100% on room air.  GENERAL:  A pleasant white female in no acute distress.  Awake, alert, and  oriented x3.  NECK:  Normal carotid upstrokes.  No bruits or JVD.  LUNGS:  Respirations regular and  unlabored.  Clear to auscultation.  CARDIAC:  Regular S1 and S2.  No S3, S4, or murmurs.  ABDOMEN:  Round, soft, nontender, nondistended, bowel sounds present x4.  EXTREMITIES:  Warm, dry, pink.  No cyanosis, clubbing, or edema.  Dorsalis  pedis and posterior tibial pulses are 2+ and equal bilaterally.   LABORATORY DATA:  Chest x-ray is pending.  EKG shows sinus rhythm with a  rate of 60 beats per minute.  She has a normal axis and diffuse J-point  elevation suggestive of early repolarization.  Hemoglobin 12.3, hematocrit  36, WBC 11.9, platelets 168.  Sodium 139, potassium 3.6, chloride 107, CO2  of 23.4, BUN 10, creatinine 0.9, glucose 111.  CK-MB 1.6, troponin-I less  than 0.05.  Urine drug screen positive for opiates.  D-dimer less than 0.22.  Urinalysis was negative.   ASSESSMENT AND PLAN:  1.  Chest pain.  Pain was somewhat improved with sublingual nitroglycerin;      however, she continues to have 2/10 chest pressure since approximately      2:30 a.m.  ECG is with diffuse J-point elevation suggestive of early      repolarization, otherwise, is normal.  Cardiac markers are negative x1      so far.  There is no change in pain with inspiration, laying flat, or      palpation.  She is being admitted to the [facility].      Agree with cycling cardiac markers and if the cardiac markers remain      negative, the patient could be discharged home in the morning and we can      arrange for followup outpatient functional study at the [facility]      Cardiology office early next week.  Continue aspirin.  Would also check      lipids and liver function tests, and add statin if necessary.  Agree      with initiating proton pump inhibitor.  2.  Tobacco abuse.  Smoking cessation strongly advised.  She does not appear      to be motivated.  3.  Irritable bowel syndrome.  She has been seen by Dr. [name] in the      past.  No complaints today. 4.  Depression.  She has been off  medication for several years and  feels      that she is doing well.      Ok Anis, NP    ______________________________  Rollene Rotunda, M.D.    CRB/MEDQ  D:  07/16/2005  T:  07/17/2005  Job:  [id number]

## 2010-07-13 ENCOUNTER — Telehealth: Payer: Self-pay | Admitting: *Deleted

## 2010-07-13 ENCOUNTER — Telehealth: Payer: Self-pay | Admitting: Internal Medicine

## 2010-07-13 NOTE — Telephone Encounter (Signed)
Pt returned my call.  I reviewed her lab work and her CT scan.  Not prominent appendix/wall thickening.  She reports that she continues to have right lower abdominal pain which is "tolerable."  She reports + low grad temp- 99's.  I recommended that she go to the ED for evaluation this afternoon.  She agrees to this plan.

## 2010-07-13 NOTE — Telephone Encounter (Signed)
[facility] Triage Call Report Triage Record Num: [id number] Operator: Amy Head Patient Name: [name] White Call Date & Time: 07/10/2010 8:01:29PM Patient Phone: [phone] PCP: Thomos Lemons Patient Gender: Female PCP Fax : [phone] Patient DOB: 1965-07-31 Practice Name: [facility] [place] Reason for Call: [name], calling from [facility] ro report STAT BMP, CBC and UA done on pt: [name], DOB [birth year], pt of Dr. Artist Pais, drawn 07/10/10. Abnormals include GLU 102, Granulocyte % LOW 33, Lymphocytes HIGH 55, UA WNL. Spoke with Dr. Caryl Never and labs reported. No orders given. Pt home # [phone]. Protocol(s) Used: Office Note Recommended Outcome per Protocol: Information Noted and Sent to Office Reason for Outcome: Caller information to office Care Advice: ~ 07/10/2010 8:08:18PM Page 1 of 1 [id number]

## 2010-07-13 NOTE — Telephone Encounter (Signed)
Left message on home phone requesting that patient return my call to discuss results this afternoon.

## 2010-07-13 NOTE — Telephone Encounter (Signed)
Pt would like results from ct scan last Friday night.

## 2010-07-15 ENCOUNTER — Other Ambulatory Visit: Payer: Self-pay | Admitting: Internal Medicine

## 2010-07-15 NOTE — Telephone Encounter (Signed)
Called [facility] [address] and asked them to fill rx originally sent on 07/09/10 for dicyclomine @ 4 X daily dosing (instead of bid dosing) #120 with 1 refill. Patient advised.

## 2010-07-17 ENCOUNTER — Encounter: Payer: Self-pay | Admitting: Family

## 2010-07-17 ENCOUNTER — Ambulatory Visit (INDEPENDENT_AMBULATORY_CARE_PROVIDER_SITE_OTHER): Payer: BC Managed Care – PPO | Admitting: Family

## 2010-07-17 ENCOUNTER — Emergency Department (INDEPENDENT_AMBULATORY_CARE_PROVIDER_SITE_OTHER): Payer: BC Managed Care – PPO

## 2010-07-17 ENCOUNTER — Emergency Department (HOSPITAL_BASED_OUTPATIENT_CLINIC_OR_DEPARTMENT_OTHER)
Admission: EM | Admit: 2010-07-17 | Discharge: 2010-07-17 | Disposition: A | Discharge status: Home / Self Care | Payer: BC Managed Care – PPO | Attending: Emergency Medicine | Admitting: Emergency Medicine

## 2010-07-17 ENCOUNTER — Ambulatory Visit: Payer: BC Managed Care – PPO | Admitting: Internal Medicine

## 2010-07-17 DIAGNOSIS — R1031 Right lower quadrant pain: Secondary | ICD-10-CM | POA: Insufficient documentation

## 2010-07-17 LAB — COMPREHENSIVE METABOLIC PANEL
BUN: 12 mg/dL (ref 6–23)
CO2: 28 mEq/L (ref 19–32)
Calcium: 8.9 mg/dL (ref 8.4–10.5)
Creatinine, Ser: 0.7 mg/dL (ref 0.4–1.2)
GFR calc non Af Amer: >60 mL/min (ref >60)
Glucose, Bld: 87 mg/dL (ref 70–99)
Sodium: 140 mEq/L (ref 135–145)
Total Protein: 7.1 g/dL (ref 6.0–8.3)

## 2010-07-17 LAB — URINALYSIS, ROUTINE W REFLEX MICROSCOPIC
Bilirubin Urine: NEGATIVE
Ketones, ur: NEGATIVE mg/dL
Nitrite: NEGATIVE
Protein, ur: NEGATIVE mg/dL
Specific Gravity, Urine: 1.014 (ref 1.005–1.030)
Urobilinogen, UA: 1 mg/dL (ref 0.0–1.0)

## 2010-07-17 LAB — CBC
Hemoglobin: 12.3 g/dL (ref 12.0–15.0)
MCH: 31.5 pg (ref 26.0–34.0)
MCHC: 33.2 g/dL (ref 30.0–36.0)
Platelets: 198 10*3/uL (ref 150–400)

## 2010-07-17 LAB — DIFFERENTIAL
Basophils Absolute: 0 10*3/uL (ref 0.0–0.1)
Basophils Relative: 1 % (ref 0–1)
Eosinophils Absolute: 0.2 10*3/uL (ref 0.0–0.7)
Monocytes Absolute: 0.6 10*3/uL (ref 0.1–1.0)
Neutro Abs: 1.8 10*3/uL (ref 1.7–7.7)
Neutrophils Relative %: 29 % — ABNORMAL LOW (ref 43–77)

## 2010-07-17 LAB — LIPASE, BLOOD: Lipase: 13 U/L (ref 11–59)

## 2010-07-17 MED ORDER — IOHEXOL 300 MG/ML  SOLN
100.0000 mL | Freq: Once | INTRAMUSCULAR | Status: AC | PRN
  Administered 2010-07-17: 100 mL via INTRAVENOUS

## 2010-07-17 NOTE — Assessment & Plan Note (Addendum)
Right lower quadrant pain, recent low grade temp and abnormal CT abdomen all concerning for developing appendicitis.  I recommended that the patient proceed directly to the ED downstairs for further evaluation.  She will needs f/u CT, CBC, UA/culture and probably a surgical consultation.  Report given to charge nurse. She is agreeable to this plan.

## 2010-07-17 NOTE — Progress Notes (Signed)
Subjective:    Patient ID: [name] White, female    DOB: 10/12/1965, 45 y.o.   MRN: [id number]  HPI  Ms.  [name] White is a 45 yr old female who presents today for follow up of her abdominal pain. She had a CT abdomen performed last Friday which noted some inflammation of the appendix.  She was advised to seek care in the ED which she did not do.  She is s/p Cystotomy repair 3/9  which she reports was performed due to bladder perf during a GYN surgery.  Notes that her abdominal pain is sometimes RLQ, LLQ, and mid right.  + associated Nausea which is not new.  No vomitting.  Temperature has been as high as 99.8 yesterday and then returns to normal.  She had surgery 3/29 and 3/8.  Pain is intermittent in nature.   Review of Systems    see HPI  Past Medical History  Diagnosis Date  . Depression   . Plantar fasciitis   . Barrett's esophagus   . Colon polyp     hyperplastic  . Gastritis   . Internal hemorrhoids   . Grief reaction   . Chronic headache   . Sunburn   . Bronchitis   . Chest pain, atypical   . Tobacco abuse   . Migraine   . Restless leg   . IBS (irritable bowel syndrome)   . Hidradenitis suppurativa   . Atypical chest pain     History   Social History  . Marital Status: Divorced    Spouse Name: N/A    Number of Children: N/A  . Years of Education: N/A   Occupational History  . Not on file.   Social History Main Topics  . Smoking status: Current Everyday Smoker -- 0.0 packs/day    Types: Cigarettes    Last Attempt to Quit: 04/22/2010  . Smokeless tobacco: Not on file  . Alcohol Use: Yes     rare  . Drug Use: No  . Sexually Active: Not on file   Other Topics Concern  . Not on file   Social History Narrative   Occupation: Payroll directorDivorced Alcohol use-yes -x 5 per weekendCurrent Smoker < 1 ppd       Past Surgical History  Procedure Date  . Wrist surgery     right  . Cesarean section     x 2  . Cholecystectomy   . Tubal ligation   . Plantar  fascitis     right foot  . Abdominal hysterectomy     secondary to endometriosis  . Laparoscopy 04-22-10    lysis of adhesions  . Elbow surgery     left  . Salpingectomy 04-22-10    right  . Laparoscopy 04-30-10     with mini laparotomy and cystotomy repair    Family History  Problem Relation Age of Onset  . COPD Mother   . Stroke Mother   . Coronary artery disease Mother   . Colon polyps Mother   . Breast cancer Mother   . Hypertension Father   . Colon polyps Brother   . Colon cancer Maternal Grandfather   . Colon polyps Maternal Grandfather   . Breast cancer Maternal Aunt   . Uterine cancer Cousin     x 2    Allergies  Allergen Reactions  . Penicillins     REACTION: hives    Current Outpatient Prescriptions on File Prior to Visit  Medication Sig Dispense Refill  . ALPRAZolam (  XANAX) 0.25 MG tablet Take 0.25 mg by mouth. Take one tablet by mouth once a day       . dicyclomine (BENTYL) 20 MG tablet Take 20 mg by mouth 4 (four) times daily.        Marland Kitchen lidocaine (XYLOCAINE) 2 % solution Take 20 mLs by mouth. Take one tablespoon(32ml) by mouth every 4 hours as needed for esophageal pain       . naproxen (NAPRELAN) 500 MG 24 hr tablet Take 500 mg by mouth. 2 tablets by mouth once a day with meal       . omeprazole (PRILOSEC) 40 MG capsule Take 1 capsule (40 mg total) by mouth 2 (two) times daily.  60 capsule  11  . ondansetron (ZOFRAN) 8 MG tablet Take by mouth. Dissolve 1 tablet by mouth twice daily as needed       . promethazine (PHENERGAN) 12.5 MG tablet Take 12.5 mg by mouth every 6 (six) hours as needed.        Marland Kitchen rOPINIRole (REQUIP) 1 MG tablet One by mouth at bedtime as needed.  30 tablet  3  . venlafaxine (EFFEXOR-XR) 150 MG 24 hr capsule Take 150 mg by mouth daily.        . diphenoxylate-atropine (LOMOTIL) 2.5-0.025 MG per tablet 1 by mouth daily  30 tablet  1  . methocarbamol (ROBAXIN) 500 MG tablet Take 500 mg by mouth. Take one tablet every 6 hours as needed       .  DISCONTD: Oxycodone HCl 10 MG TABS Take by mouth. One 2 tablets at bedtime      . DISCONTD: sulfamethoxazole-trimethoprim (BACTRIM DS) 800-160 MG per tablet Take 1 tablet by mouth 2 (two) times daily.        Marland Kitchen DISCONTD: SUMAtriptan (IMITREX) 25 MG tablet Take 25 mg by mouth. Take 1 tablet by mouth every 6 hours as needed for pain         BP 100/80  Pulse 78  Temp(Src) 98.7 F (37.1 C) (Oral)  Resp 16  Ht 5\' 5"  (1.651 m)  Wt 209 lb (94.802 kg)  BMI 34.78 kg/m2    Objective:   Physical Exam Gen: awake, alert, tired appearing female in NAD CV: s1/s2, RRR, no edema is noted Resp: BS CTA bilaterally without wheezes, rales, rhonchi Abd: + BS, + guarding, + rebound tenderness in the right lower quadrant.         Assessment & Plan:

## 2010-07-17 NOTE — Patient Instructions (Signed)
Go directly downstairs to the ED.

## 2010-08-03 ENCOUNTER — Ambulatory Visit (INDEPENDENT_AMBULATORY_CARE_PROVIDER_SITE_OTHER): Payer: BC Managed Care – PPO | Admitting: Family Medicine

## 2010-08-03 ENCOUNTER — Ambulatory Visit: Payer: BC Managed Care – PPO | Admitting: Internal Medicine

## 2010-08-03 ENCOUNTER — Encounter: Payer: Self-pay | Admitting: Family Medicine

## 2010-08-03 DIAGNOSIS — R1031 Right lower quadrant pain: Secondary | ICD-10-CM

## 2010-08-03 MED ORDER — ONDANSETRON HCL 8 MG PO TABS: 8.0000 mg | ORAL_TABLET | Freq: Three times a day (TID) | ORAL | Status: DC | PRN

## 2010-08-03 MED ORDER — CIPROFLOXACIN HCL 500 MG PO TABS: 500.0000 mg | ORAL_TABLET | Freq: Two times a day (BID) | ORAL | Status: AC

## 2010-08-03 MED ORDER — HYDROCODONE-ACETAMINOPHEN 5-500 MG PO TABS: ORAL_TABLET | ORAL | Status: DC

## 2010-08-03 MED ORDER — METRONIDAZOLE 500 MG PO TABS: 500.0000 mg | ORAL_TABLET | Freq: Three times a day (TID) | ORAL | Status: AC

## 2010-08-03 NOTE — Progress Notes (Signed)
OFFICE VISIT  08/03/2010   CC:  Chief Complaint  Patient presents with  . Follow-up    discuss CT Scan and blood work     HPI:    Patient is a 45 y.o. Caucasian female who presents for abdominal pain. Complicated history of this over the last 4 mo or so: pelvic pain with intercourse led to imaging by her GYN that apparently showed hydrosalpix on right, so she had surgery and this was removed.  In post op period she had ongoing pain, nausea/vomiting, was readmitted and ultimately found to have a perforated bladder and this was repaired.  Had post-op ileus, mesenteric infxn treated with abx, d/c'd home and has still had constant right lower quadrant discomfort, with intermittent worsening of pain (some sharp and some dull).  Also has had constant nausea, esp when she tries to eat anything.  Has had some constipation alt/w diarrhea, notes a large prolapsing hemorrhoid lately that she feels is causing constant rectal pressure. Because of these ongoing symptoms she had CT abd 07/10/10 and this showed a slightly thickened appendix, normal CBC at that time, no fevers---she did not go seek care at the ED as instructed.  She then was sent to the ED from the office 07/17/10 by Sandford Craze b/c she was not better and had the previously mentioned CT findings.  A repeat CT abd/pelvis showed NO ABNORMALITY at that time.  CBC, CMET, and UA were all normal in ED, as well. She's basically miserable, says the bentyl is not helping, asks for someone to help her please.  Past Medical History  Diagnosis Date  . Depression   . Plantar fasciitis   . Barrett's esophagus   . Colon polyp     hyperplastic  . Gastritis   . Internal hemorrhoids   . Grief reaction   . Chronic headache   . Sunburn   . Bronchitis   . Chest pain, atypical   . Tobacco abuse   . Migraine   . Restless leg   . IBS (irritable bowel syndrome)   . Hidradenitis suppurativa   . Atypical chest pain     Past Surgical History    Procedure Date  . Wrist surgery     right  . Cesarean section     x 2  . Cholecystectomy   . Tubal ligation   . Plantar fascitis     right foot  . Abdominal hysterectomy     secondary to endometriosis  . Laparoscopy 04-22-10    lysis of adhesions  . Elbow surgery     left  . Salpingectomy 04-22-10    right  . Laparoscopy 04-30-10     with mini laparotomy and cystotomy repair    Outpatient Prescriptions Prior to Visit  Medication Sig Dispense Refill  . dicyclomine (BENTYL) 20 MG tablet Take 20 mg by mouth 4 (four) times daily.        . diphenoxylate-atropine (LOMOTIL) 2.5-0.025 MG per tablet 1 by mouth daily  30 tablet  1  . lidocaine (XYLOCAINE) 2 % solution Take 20 mLs by mouth. Take one tablespoon(8ml) by mouth every 4 hours as needed for esophageal pain       . omeprazole (PRILOSEC) 40 MG capsule Take 1 capsule (40 mg total) by mouth 2 (two) times daily.  60 capsule  11  . rOPINIRole (REQUIP) 1 MG tablet One by mouth at bedtime as needed.  30 tablet  3  . venlafaxine (EFFEXOR-XR) 150 MG 24 hr capsule Take  150 mg by mouth daily.        . ondansetron (ZOFRAN) 8 MG tablet Take by mouth. Dissolve 1 tablet by mouth twice daily as needed       . ALPRAZolam (XANAX) 0.25 MG tablet Take 0.25 mg by mouth. Take one tablet by mouth once a day       . methocarbamol (ROBAXIN) 500 MG tablet Take 500 mg by mouth. Take one tablet every 6 hours as needed       . naproxen (NAPRELAN) 500 MG 24 hr tablet Take 500 mg by mouth. 2 tablets by mouth once a day with meal       . promethazine (PHENERGAN) 12.5 MG tablet Take 12.5 mg by mouth every 6 (six) hours as needed.        Pt. Taking zofran, not phenergan.  Allergies  Allergen Reactions  . Penicillins     REACTION: hives    ROS As per HPI No blood in stool.  No dysuria, urgency, frequency, or hematuria.  No fever (Tm 100).  No rash.  PE: Blood pressure 122/64, pulse 80, temperature 98.5 F (36.9 C), temperature source Oral, resp. rate 18,  height 5\' 6"  (1.676 m), weight 205 lb (92.987 kg), SpO2 100.00%. Gen: Alert, well appearing.  Patient is oriented to person, place, time, and situation. ENT: no scleral icteris.  SKIN: no pallor or jaundice Chest: symmetric expansion, nonlabored respirations.  Clear and equal breath sounds in all lung fields.   CV: RRR, no m/r/g.  Peripheral pulses 2+ and symmetric. ABD: soft, rotund but no obvious distention.  Moderate TTP in all quadrants except RUQ, equivocal rebound tenderness.  BS normal in epigastric area but hypoactive in all other areas.  No mass or HSM palpable. Ext: no c/c/e  LABS:  None today  IMPRESSION AND PLAN:  Abdominal pain, chronic, right lower quadrant Ever since GYN surgery 03/2010. No change in overall status.  Given normal CT 07/17/10 with normal labs and urine at that time, I do not feel any testing is indicated presently. I am going to treat for the possibility of ongoing low grade peritonitis from the bladder perf she had back in 04/2010. Cipro 500mg  bid x 10d, flagyl 500mg  tid x 10d. Stop bentyl for now. Vicodin 5/500, 1-2 q6h prn, #30, RF x 1. Therapeutic expectations and side effect profile of medication discussed today.  Patient's questions answered. Continue stool softener, add prn miralax. I asked her to make f/u appt with her GI MD, Dr. Dickie La, for her opinion ASAP.     FOLLOW UP: Return in about 1 week (around 08/10/2010).

## 2010-08-03 NOTE — Assessment & Plan Note (Signed)
Ever since GYN surgery 03/2010. No change in overall status.  Given normal CT 07/17/10 with normal labs and urine at that time, I do not feel any testing is indicated presently. I am going to treat for the possibility of ongoing low grade peritonitis from the bladder perf she had back in 04/2010. Cipro 500mg  bid x 10d, flagyl 500mg  tid x 10d. Stop bentyl for now. Vicodin 5/500, 1-2 q6h prn, #30, RF x 1. Therapeutic expectations and side effect profile of medication discussed today.  Patient's questions answered. Continue stool softener, add prn miralax. I asked her to make f/u appt with her GI MD, Dr. Dickie La, for her opinion ASAP.

## 2010-08-05 ENCOUNTER — Telehealth: Payer: Self-pay | Admitting: Internal Medicine

## 2010-08-05 NOTE — Telephone Encounter (Signed)
I agree with pt being seen ASAP, when I saw her in 04/2010, she was doing very well. Obviously, she has relapsed.

## 2010-08-05 NOTE — Telephone Encounter (Signed)
Patient calling to report she is having abdominal issues and she saw her PCP Dr Milinda Cave on Monday. He took her off her Bentyl and put her on Cipro and Flagyl x 10 days. She is tender "all over whole abdomen" and states she has had 2 CT scans and they cannot find out what is wrong. States Dr. Milinda Cave told her to see Dr. Marjie Skiff. Scheduled patient to see Mike Gip, PA on 08/06/10 at 10:00 AM(Dr. Juanda Chance supervising)

## 2010-08-06 ENCOUNTER — Encounter: Payer: Self-pay | Admitting: Physician Assistant

## 2010-08-06 ENCOUNTER — Ambulatory Visit (INDEPENDENT_AMBULATORY_CARE_PROVIDER_SITE_OTHER): Payer: BC Managed Care – PPO | Admitting: Physician Assistant

## 2010-08-06 ENCOUNTER — Other Ambulatory Visit (INDEPENDENT_AMBULATORY_CARE_PROVIDER_SITE_OTHER): Payer: BC Managed Care – PPO

## 2010-08-06 VITALS — BP 130/64 | HR 100 | Ht 66.0 in | Wt 207.8 lb

## 2010-08-06 DIAGNOSIS — R1031 Right lower quadrant pain: Secondary | ICD-10-CM

## 2010-08-06 DIAGNOSIS — R110 Nausea: Secondary | ICD-10-CM

## 2010-08-06 DIAGNOSIS — R1032 Left lower quadrant pain: Secondary | ICD-10-CM

## 2010-08-06 LAB — COMPREHENSIVE METABOLIC PANEL
ALT: 19 U/L (ref 0–35)
AST: 17 U/L (ref 0–37)
CO2: 29 mEq/L (ref 19–32)
Calcium: 8.8 mg/dL (ref 8.4–10.5)
Chloride: 107 mEq/L (ref 96–112)
GFR: 92.77 mL/min (ref >60.00)
Potassium: 4.2 mEq/L (ref 3.5–5.1)
Sodium: 141 mEq/L (ref 135–145)
Total Protein: 7.3 g/dL (ref 6.0–8.3)

## 2010-08-06 LAB — URINALYSIS
Bilirubin Urine: NEGATIVE
Hgb urine dipstick: NEGATIVE
Total Protein, Urine: NEGATIVE
Urine Glucose: NEGATIVE
Urobilinogen, UA: 0.2 (ref 0.0–1.0)

## 2010-08-06 LAB — CBC WITH DIFFERENTIAL/PLATELET
Basophils Absolute: 0 10*3/uL (ref 0.0–0.1)
Basophils Relative: 0.6 % (ref 0.0–3.0)
Eosinophils Absolute: 0.1 10*3/uL (ref 0.0–0.7)
Lymphocytes Relative: 48.5 % — ABNORMAL HIGH (ref 12.0–46.0)
MCHC: 34.3 g/dL (ref 30.0–36.0)
MCV: 94.7 fl (ref 78.0–100.0)
Monocytes Absolute: 0.5 10*3/uL (ref 0.1–1.0)
Neutro Abs: 2 10*3/uL (ref 1.4–7.7)
Neutrophils Relative %: 38.7 % — ABNORMAL LOW (ref 43.0–77.0)
RBC: 3.92 Mil/uL (ref 3.87–5.11)
RDW: 13.1 % (ref 11.5–14.6)

## 2010-08-06 NOTE — Patient Instructions (Signed)
Please go to the basement level to have your labs drawn.  Continue the Cipro and Flagyl.  Amy will call you later this afternoon after she gets the lab results.

## 2010-08-06 NOTE — Progress Notes (Signed)
Subjective:    Patient ID: [name] White, female    DOB: Dec 20, 1965, 45 y.o.   MRN: [id number]  HPI Lindie is a 45 year old white female known to Dr. [name] with history of Barrett's esophagus, chronic GERD And Colon Polyps. She has had a recent complicated course over the past 4 months which initially started with pelvic pain. She had evaluation by her gynecologist Dr. Ellyn Hack and was found to have hydrosalpinx on the right. She underwent a laparoscopy with lysis of adhesions and a right salpingectomy on 04/30/2010. She had persistent pain postoperatively and had a repeat diagnostic laparoscopy and a mini laparotomy the following day with a cystostomy repair and cystotomy. Postoperatively was complicated by a prolonged ileus and CT of the abdomen on 3/11 showed increasing lobules of gas and soft tissue stranding a nonspecific C. colitis and enteritis involving the proximal jejunum It was felt that phlegmon [place] change/infection was likely. She was discharged on 3/17. Unfortunately she has continued to have problems which have gotten worse over the past 2-3 weeks. She has had continued nausea and says she's really unable to tolerate much by mouth other than crackers and serial due to increase in abdominal pain after eating. She has had some left-sided sharp abdominal pain but primarily has had ongoing constant right lower quadrant pain. She has been somewhat constipated. She has not had any documented fever but does relate intermittent chills and has had a had a lot of sweats primarily at night. She says she's nauseated 24 hours a day. She's had some mild heartburn and indigestion. She continues to complain of pressure in her lower abdomen with urinating.  Patient was seen by primary care on May 18 and underwent a repeat CT scan of the abdomen and pelvis which showed the distal right ureter incompletely opacified but urinary bladder appeared normal she had postsurgical changes in the anterior abdominal  wall with mesenteric edema and ascites almost completely resolved minimal residual. Vesicle soft tissue stranding there was also noted a 3.4 cm low-density left ovarian lesion right ovarian ovary appears normal uterus surgically absent the appendix appeared to be prominent measuring 8 mm in thickness and wall somewhat thickened as well but no surrounding inflammatory change and therefore could not rule out an appendicitis. She was instructed at that time to go to the emergency room for evaluation which he did not do and do not have labs but there was seen back in primary care of 5/25 CBC C. minute UA etc. were all unremarkable. A repeat CT scan of the abdomen and pelvis was done which was read as negative with a normal appendix there was no ascites noted and no free air.  Patient was seen on 6/11 by Dr. Milinda Cave for primary care and at that point felt possibly to have a low-grade peritonitis and was started on Cipro and Flagyl. At this point she's been taking antibiotics for 3 days and at the has not noticed any change in her condition.    Review of Systems  Constitutional: Positive for chills, diaphoresis and appetite change.  HENT: Negative.   Eyes: Negative.   Respiratory: Negative.   Gastrointestinal: Positive for nausea and abdominal pain.  Genitourinary: Positive for pelvic pain.  Musculoskeletal: Negative.   Neurological: Negative.   Psychiatric/Behavioral: Negative.    Outpatient Prescriptions Prior to Visit  Medication Sig Dispense Refill  . ciprofloxacin (CIPRO) 500 MG tablet Take 1 tablet (500 mg total) by mouth 2 (two) times daily.  20 tablet  0  .  HYDROcodone-acetaminophen (VICODIN) 5-500 MG per tablet 1-2 tabs po q6h prn pain  30 tablet  1  . lidocaine (XYLOCAINE) 2 % solution Take 20 mLs by mouth. Take one tablespoon(3ml) by mouth every 4 hours as needed for esophageal pain       . metroNIDAZOLE (FLAGYL) 500 MG tablet Take 1 tablet (500 mg total) by mouth 3 (three) times daily.   30 tablet  0  . omeprazole (PRILOSEC) 40 MG capsule Take 1 capsule (40 mg total) by mouth 2 (two) times daily.  60 capsule  11  . ondansetron (ZOFRAN) 8 MG tablet Take 1 tablet (8 mg total) by mouth every 8 (eight) hours as needed for nausea. Dissolve 1 tablet by mouth twice daily as needed  30 tablet  2  . rOPINIRole (REQUIP) 1 MG tablet One by mouth at bedtime as needed.  30 tablet  3  . venlafaxine (EFFEXOR-XR) 150 MG 24 hr capsule Take 150 mg by mouth daily.        Marland Kitchen ALPRAZolam (XANAX) 0.25 MG tablet Take 0.25 mg by mouth. Take one tablet by mouth once a day       . dicyclomine (BENTYL) 20 MG tablet Take 20 mg by mouth 4 (four) times daily.        . diphenoxylate-atropine (LOMOTIL) 2.5-0.025 MG per tablet 1 by mouth daily  30 tablet  1  . methocarbamol (ROBAXIN) 500 MG tablet Take 500 mg by mouth. Take one tablet every 6 hours as needed       . naproxen (NAPRELAN) 500 MG 24 hr tablet Take 500 mg by mouth. 2 tablets by mouth once a day with meal       . promethazine (PHENERGAN) 12.5 MG tablet Take 12.5 mg by mouth every 6 (six) hours as needed.            Allergies  Allergen Reactions  . Morphine And Related   . Penicillins     REACTION: hives    Objective:   Physical Exam Well-developed white female in no acute distress, uncomfortable. HEENT; nontraumatic normocephalic EOMI PERRLA sclera anicteric; Neck supple; no JVD  Cardiovascular; regular rate and rhythm with S1-S2 no murmur or gallop  Pulmonary; clear bilaterally  Abdomen; soft bowel sounds active she is quite tender in the right lower quadrant no palpable mass no definite rebound also tender in the left lower quadrant no rebound  Rectal ;not done  Skin/extremities; benign warm and dry  Psych ;mood and affect normal an appropriate.        Assessment & Plan:  #12 45 year old white female with persistent right lower quadrant pain and nausea x4 months status post removal of right hydrosalpinx. Surgery complicated by multiple  adhesions and bladder injury which was repaired. Patient had postop ileus and evidence for peritonitis during initial hospitalization. Most recent workups with unremarkable labs and a negative CT scan abdomen and pelvis on 5/25 2012. Currently being treated with oral Cipro and Flagyl for possible persistent low-grade peritonitis.  Etiology of her symptoms is not clear, and most recent workup with no evidence for appendicitis, abscess or residual intra-abdominal fluid. Question pain secondary to adhesions, persistent low-grade peritonitis.  Plan; Continue and complete current course of Cipro and Flagyl x10 days Repeat labs today to include CBC, CMET, sedimentation rate, CRP and UA Discussed [place] with Dr. [name]  regarding further imaging versus endoscopic evaluation versus surgical referral. Will review above labs first and then decide on indication for any further workup.

## 2010-08-07 ENCOUNTER — Telehealth: Payer: Self-pay | Admitting: Physician Assistant

## 2010-08-10 ENCOUNTER — Telehealth: Payer: Self-pay | Admitting: *Deleted

## 2010-08-10 NOTE — Telephone Encounter (Signed)
The pt had called in today for lab results.  I did call her and advise that we sent the lab results to Dr. Regino Schultze computer and she will review them and we then will decide how we want to proceed. Amy Esterwood PA had in her office note that she discussed this [place] with Dr. Juanda Chance.  Amy put in her note that we will look at the labs and decide on indication for any further workup.

## 2010-08-10 NOTE — Telephone Encounter (Signed)
All Labs are normal, consider colonoscopy as a next step.

## 2010-08-11 ENCOUNTER — Encounter: Payer: Self-pay | Admitting: *Deleted

## 2010-08-11 ENCOUNTER — Other Ambulatory Visit: Payer: Self-pay | Admitting: *Deleted

## 2010-08-11 ENCOUNTER — Ambulatory Visit: Payer: BC Managed Care – PPO | Admitting: Family Medicine

## 2010-08-11 ENCOUNTER — Telehealth: Payer: Self-pay | Admitting: *Deleted

## 2010-08-11 DIAGNOSIS — R110 Nausea: Secondary | ICD-10-CM

## 2010-08-11 DIAGNOSIS — R1031 Right lower quadrant pain: Secondary | ICD-10-CM

## 2010-08-11 MED ORDER — PEG-KCL-NACL-NASULF-NA ASC-C 100 G PO SOLR: ORAL | Status: DC

## 2010-08-11 NOTE — Telephone Encounter (Signed)
I talked to pt on the phone today.  She is no better/no worse on the cipro/flagyl since I saw her last week. She saw Dr. Juanda Chance last Thursday and they have decided to do colonoscopy tomorrow.  She has prep this afternoon, and it is definitely fine to cancel her appt with me this afternoon.  I encouraged her to make f/u appt with [place] after her colonoscopy if there are still any unanswered questions after Dr. Regino Schultze w/u is complete.

## 2010-08-11 NOTE — Telephone Encounter (Signed)
Patient seen on office on 07/17/2010. Labs have been addressed

## 2010-08-11 NOTE — Telephone Encounter (Signed)
Dr Juanda Chance responded and thinks the patient should have a colonoscopy. Called pt and advised her that Dr. Juanda Chance reviewed labs and thinks she should have a colonoscopy. The patient was fine with that and we scheduled this for tomorrow 08-12-2010. Pt is coming in today at 1:15 Pm for directions and to sign the paperwod, meeting with Lowry Ram CMA.

## 2010-08-11 NOTE — Telephone Encounter (Signed)
Pt left message stating she has appt tomorrow for colonoscopy at 2 pm. Wants to know if she needs to keep 3pm appt with Dr Milinda Cave today. Please advise.

## 2010-08-12 ENCOUNTER — Encounter: Payer: Self-pay | Admitting: Internal Medicine

## 2010-08-12 ENCOUNTER — Ambulatory Visit (AMBULATORY_SURGERY_CENTER): Payer: BC Managed Care – PPO | Admitting: Internal Medicine

## 2010-08-12 DIAGNOSIS — R1031 Right lower quadrant pain: Secondary | ICD-10-CM

## 2010-08-12 DIAGNOSIS — R109 Unspecified abdominal pain: Secondary | ICD-10-CM

## 2010-08-12 DIAGNOSIS — R110 Nausea: Secondary | ICD-10-CM

## 2010-08-12 DIAGNOSIS — D126 Benign neoplasm of colon, unspecified: Secondary | ICD-10-CM

## 2010-08-12 MED ORDER — SODIUM CHLORIDE 0.9 % IV SOLN: 500.0000 mL | INTRAVENOUS | Status: DC

## 2010-08-12 MED ORDER — LIDOCAINE 5 % EX PTCH: 1.0000 | MEDICATED_PATCH | Freq: Two times a day (BID) | CUTANEOUS | Status: DC

## 2010-08-13 ENCOUNTER — Telehealth: Payer: Self-pay | Admitting: *Deleted

## 2010-08-13 NOTE — Telephone Encounter (Signed)
Follow up Call- Patient questions:  Do you have a fever, pain , or abdominal swelling? no Pain Score  0 *  Have you tolerated food without any problems? yes  Have you been able to return to your normal activities? yes  Do you have any questions about your discharge instructions: Diet   no Medications  no Follow up visit  no  Do you have questions or concerns about your Care? no  Actions: * If pain score is 4 or above: No action needed, pain <4.  Patient stating she does have a headache today.No abdominal pain or fever. No  questions or concerns.

## 2010-08-14 ENCOUNTER — Ambulatory Visit (AMBULATORY_SURGERY_CENTER): Payer: BC Managed Care – PPO | Admitting: *Deleted

## 2010-08-14 DIAGNOSIS — K2270 Barrett's esophagus without dysplasia: Secondary | ICD-10-CM

## 2010-08-14 DIAGNOSIS — R1310 Dysphagia, unspecified: Secondary | ICD-10-CM

## 2010-08-18 ENCOUNTER — Encounter: Payer: Self-pay | Admitting: Internal Medicine

## 2010-08-19 ENCOUNTER — Telehealth: Payer: Self-pay | Admitting: Internal Medicine

## 2010-08-19 NOTE — Telephone Encounter (Signed)
Very complex problem that cannot be adequately addressed without office evaluation.  Needs follow up with Dr. Juanda Chance and with her GYN and surgeon

## 2010-08-19 NOTE — Telephone Encounter (Signed)
Pt states that Dr. Juanda Chance gave her Lidoderm patches for abdominal pain. Pt states that these are not working. She reports that the pain is in her stomach/intestinal area and that it does not feel like gas pains. Pt states that it feels like someone is stabbing her with a knife at times. She was taking dicyclomine but this caused her to have constipation. Pt would like something else to help with the stomach pain. Dr. Russella Dar as doc of the day please advise.

## 2010-08-20 NOTE — Telephone Encounter (Signed)
Spoke with pt and she is aware of the recommendations by Dr. Russella Dar. Pt states she will wait until her appt on 08/31/10 with Dr. Juanda Chance for the endo.

## 2010-08-20 NOTE — Telephone Encounter (Signed)
Left message for pt to call back  °

## 2010-08-31 ENCOUNTER — Encounter: Payer: Self-pay | Admitting: Internal Medicine

## 2010-08-31 ENCOUNTER — Other Ambulatory Visit (HOSPITAL_COMMUNITY)
Admission: RE | Admit: 2010-08-31 | Discharge: 2010-08-31 | Disposition: A | Discharge status: Home / Self Care | Payer: BC Managed Care – PPO | Source: Ambulatory Visit | Attending: Internal Medicine | Admitting: Internal Medicine

## 2010-08-31 ENCOUNTER — Ambulatory Visit (AMBULATORY_SURGERY_CENTER): Payer: BC Managed Care – PPO | Admitting: Internal Medicine

## 2010-08-31 ENCOUNTER — Telehealth: Payer: Self-pay | Admitting: Internal Medicine

## 2010-08-31 VITALS — BP 115/72 | HR 73 | Temp 99.0°F | Resp 14 | Ht 66.0 in | Wt 207.0 lb

## 2010-08-31 DIAGNOSIS — K449 Diaphragmatic hernia without obstruction or gangrene: Secondary | ICD-10-CM

## 2010-08-31 DIAGNOSIS — B3781 Candidal esophagitis: Secondary | ICD-10-CM

## 2010-08-31 DIAGNOSIS — K2270 Barrett's esophagus without dysplasia: Secondary | ICD-10-CM

## 2010-08-31 DIAGNOSIS — K219 Gastro-esophageal reflux disease without esophagitis: Secondary | ICD-10-CM

## 2010-08-31 DIAGNOSIS — R1031 Right lower quadrant pain: Secondary | ICD-10-CM

## 2010-08-31 DIAGNOSIS — R109 Unspecified abdominal pain: Secondary | ICD-10-CM

## 2010-08-31 DIAGNOSIS — B49 Unspecified mycosis: Secondary | ICD-10-CM | POA: Insufficient documentation

## 2010-08-31 DIAGNOSIS — K209 Esophagitis, unspecified without bleeding: Secondary | ICD-10-CM

## 2010-08-31 MED ORDER — SODIUM CHLORIDE 0.9 % IV SOLN: 500.0000 mL | INTRAVENOUS | Status: DC

## 2010-08-31 MED ORDER — FLUCONAZOLE 100 MG PO TABS: 100.0000 mg | ORAL_TABLET | Freq: Every day | ORAL | Status: AC

## 2010-08-31 NOTE — Patient Instructions (Signed)
Follow the blue and green discharge instruction sheets the rest of the day.  See your picture page for your findings of your exam.  Resume your prior medications today along with new prescription for Diflucan (Fluconazole).  Written prescription was given to your care partner today.  Please call if questions or concerns.

## 2010-08-31 NOTE — Telephone Encounter (Signed)
Pt was given RX in February for nerves.  She was given #30, 0 refills at that time.  Please advise.

## 2010-08-31 NOTE — Progress Notes (Signed)
Per Dr. Juanda Chance give pt a copy of her colonoscopy report that was done recently.  Per the pt she did not receive pictures of her colonoscopy.  Weston Brass, RN pulled up the colonoscopy report, but no pictures were on the pentax report.  A copy of the colonoscopy report was given to the pt and she was explained that no pictures were on it.  She understood.  MAW  No complaints were noted on discharge.  MAW

## 2010-08-31 NOTE — Telephone Encounter (Signed)
OK to give #30 with no refills.

## 2010-08-31 NOTE — Telephone Encounter (Signed)
Refill- alprazolam 0.25mg  tablet. Take one tablet by mouth every day as needed. Qty 30. Last fill 2.9.12

## 2010-09-01 ENCOUNTER — Telehealth: Payer: Self-pay

## 2010-09-01 MED ORDER — ALPRAZOLAM 0.25 MG PO TABS: 0.2500 mg | ORAL_TABLET | Freq: Every day | ORAL | Status: DC

## 2010-09-01 NOTE — Telephone Encounter (Signed)
No ID on answering machine. 

## 2010-09-01 NOTE — Telephone Encounter (Signed)
Refill called to [facility] at [facility].

## 2010-09-03 ENCOUNTER — Encounter: Payer: Self-pay | Admitting: *Deleted

## 2010-09-03 ENCOUNTER — Encounter: Payer: Self-pay | Admitting: Internal Medicine

## 2010-09-07 ENCOUNTER — Encounter: Payer: Self-pay | Admitting: Internal Medicine

## 2010-09-07 ENCOUNTER — Telehealth: Payer: Self-pay | Admitting: Internal Medicine

## 2010-09-07 NOTE — Telephone Encounter (Signed)
Please call in Majic mouthwash, 8 0z, swish and swallow tid, 1 refill.

## 2010-09-07 NOTE — Telephone Encounter (Signed)
Patient calling to report that after her EGD on 08/31/10, she has had a sore, scratchy throat. She finished the Diflucan.She is using warm salt water gargles and Chloraseptic spray without relief. Please, advise.

## 2010-09-08 MED ORDER — MAGIC MOUTHWASH: ORAL | Status: DC

## 2010-09-08 NOTE — Telephone Encounter (Signed)
Patient notified of Dr. Brodie's recommendation. Rx sent. 

## 2010-09-08 NOTE — Telephone Encounter (Signed)
Normal lab and CT

## 2010-09-09 NOTE — Telephone Encounter (Signed)
ERROR    WRONG PTS CHART

## 2010-09-16 ENCOUNTER — Ambulatory Visit (INDEPENDENT_AMBULATORY_CARE_PROVIDER_SITE_OTHER): Payer: BC Managed Care – PPO | Admitting: Internal Medicine

## 2010-09-16 ENCOUNTER — Encounter: Payer: Self-pay | Admitting: Internal Medicine

## 2010-09-16 VITALS — BP 100/62 | HR 84 | Ht 66.0 in | Wt 203.0 lb

## 2010-09-16 DIAGNOSIS — K2270 Barrett's esophagus without dysplasia: Secondary | ICD-10-CM

## 2010-09-16 DIAGNOSIS — R933 Abnormal findings on diagnostic imaging of other parts of digestive tract: Secondary | ICD-10-CM

## 2010-09-16 NOTE — Patient Instructions (Signed)
You have been scheduled to see Dr. Felicity Coyer as a New patient on 10/09/10 at 9:30am. Please bring your medication list and if you need to cancel or reschedule your appointment, please call [phone]. If you do not call to cancel or reschedule one day prior to your appointment, you are charged a 50 dollar no show fee.  cc: Rene Paci, MD

## 2010-09-16 NOTE — Progress Notes (Signed)
[name] White March 11, 1965 MRN [id number]     History of Present Illness:  This is a 45 year old white female with a history of Barrett's esophagus in 2007 and 2009, not confirmed on the last 2 endoscopies in 2011 and in July 2012. She also has right lower quadrant abdominal pain which has been related to a bladder injury during a right hydrosalpinx resection. Her last CT scan of the abdomen on 07/10/2010 showed near complete resolution of the inflammatory changes and mesenteric edema in the pelvis. She had a colonoscopy in June 2012 with findings of 2 polyps and she had an upper endoscopy in June 2012 with findings of Candida esophagitis.   Past Medical History  Diagnosis Date  . Depression   . Plantar fasciitis   . Barrett's esophagus   . Colon polyp     hyperplastic  . Gastritis   . Internal hemorrhoids   . Grief reaction   . Chronic headache   . Sunburn   . Bronchitis   . Chest pain, atypical   . Tobacco abuse   . Migraine   . Restless leg   . IBS (irritable bowel syndrome)   . Hidradenitis suppurativa   . Atypical chest pain    Past Surgical History  Procedure Date  . Wrist surgery     right  . Cesarean section     x 2  . Cholecystectomy   . Tubal ligation   . Plantar fascitis     right foot  . Abdominal hysterectomy     secondary to endometriosis  . Laparoscopy 04-22-10    lysis of adhesions  . Elbow surgery     left  . Salpingectomy 04-22-10    right  . Laparoscopy 04-30-10     with mini laparotomy and cystotomy repair    reports that she has been smoking Cigarettes.  She has been smoking about .5 packs per day. She has never used smokeless tobacco. She reports that she drinks alcohol. She reports that she does not use illicit drugs. family history includes Breast cancer in her maternal aunt and mother; COPD in her mother; Colon cancer in her maternal grandfather; Colon polyps in her brother, maternal grandfather, and mother; Coronary artery disease in her  mother; Hypertension in her father; Stroke in her mother; and Uterine cancer in her cousin. Allergies  Allergen Reactions  . Morphine And Related   . Penicillins     REACTION: hives        Review of Systems:  The remainder of the 10  point ROS is negative except as outlined in H&P   Physical Exam: General appearance  Well developed, in no distress. Eyes- non icteric. HEENT nontraumatic, normocephalic. Mouth no lesions, tongue papillated, no cheilosis. Neck supple without adenopathy, thyroid not enlarged, no carotid bruits, no JVD. Lungs Clear to auscultation bilaterally. Cor normal S1 normal S2, regular rhythm , no murmur,  quiet precordium. Abdomen tenderness in the right lower quadrant but no palpable mass or rebound. Normoactive bowel sounds. Left side of the abdomen unremarkable. Rectal: Not done. Extremities no pedal edema. Skin no lesions. Neurological alert and oriented x 3. Psychological normal mood and affect.  Assessment and Plan:  Problem#1 resolving inflammatory process in the pelvis secondary to bladder injury during pelvic surgery in February 2012. Her imaging studies are now normal and she will have to wait it out. She has completed all antibiotics. I told her that it may take up to one year for the posterior surgical changes  to resolve.  Problem #2 Barrett's esophagus. Patient is to continue omeprazole 20 mg daily. No Barrett's esophagus was seen on the last 2 endoscopies. I will see her in 8 weeks for followup.   09/16/2010 [name]

## 2010-10-05 ENCOUNTER — Emergency Department (HOSPITAL_COMMUNITY)
Admission: EM | Admit: 2010-10-05 | Discharge: 2010-10-05 | Disposition: A | Discharge status: Home / Self Care | Payer: BC Managed Care – PPO | Attending: Emergency Medicine | Admitting: Emergency Medicine

## 2010-10-05 ENCOUNTER — Encounter (HOSPITAL_COMMUNITY): Payer: Self-pay

## 2010-10-05 ENCOUNTER — Emergency Department (HOSPITAL_COMMUNITY): Payer: BC Managed Care – PPO

## 2010-10-05 DIAGNOSIS — N83209 Unspecified ovarian cyst, unspecified side: Secondary | ICD-10-CM | POA: Insufficient documentation

## 2010-10-05 DIAGNOSIS — E876 Hypokalemia: Secondary | ICD-10-CM | POA: Insufficient documentation

## 2010-10-05 DIAGNOSIS — R1031 Right lower quadrant pain: Secondary | ICD-10-CM | POA: Insufficient documentation

## 2010-10-05 LAB — CBC
HCT: 38.4 % (ref 36.0–46.0)
RDW: 13.2 % (ref 11.5–15.5)
WBC: 5.7 10*3/uL (ref 4.0–10.5)

## 2010-10-05 LAB — URINALYSIS, ROUTINE W REFLEX MICROSCOPIC
Hgb urine dipstick: NEGATIVE
Leukocytes, UA: NEGATIVE
Nitrite: NEGATIVE
Protein, ur: 30 mg/dL — AB
Specific Gravity, Urine: 1.02 (ref 1.005–1.030)
Urobilinogen, UA: 1 mg/dL (ref 0.0–1.0)

## 2010-10-05 LAB — BASIC METABOLIC PANEL
BUN: 9 mg/dL (ref 6–23)
Calcium: 9.4 mg/dL (ref 8.4–10.5)
Creatinine, Ser: 0.69 mg/dL (ref 0.50–1.10)
GFR calc Af Amer: >60 mL/min (ref >60)
GFR calc non Af Amer: >60 mL/min (ref >60)
Glucose, Bld: 104 mg/dL — ABNORMAL HIGH (ref 70–99)
Potassium: 3 mEq/L — ABNORMAL LOW (ref 3.5–5.1)

## 2010-10-05 LAB — DIFFERENTIAL
Basophils Absolute: 0.1 10*3/uL (ref 0.0–0.1)
Eosinophils Absolute: 0.1 10*3/uL (ref 0.0–0.7)
Lymphocytes Relative: 34 % (ref 12–46)
Lymphs Abs: 1.9 10*3/uL (ref 0.7–4.0)
Monocytes Relative: 10 % (ref 3–12)

## 2010-10-05 LAB — URINE MICROSCOPIC-ADD ON

## 2010-10-05 MED ORDER — IOHEXOL 300 MG/ML  SOLN
100.0000 mL | Freq: Once | INTRAMUSCULAR | Status: AC | PRN
  Administered 2010-10-05: 100 mL via INTRAVENOUS

## 2010-10-09 ENCOUNTER — Encounter: Payer: Self-pay | Admitting: Internal Medicine

## 2010-10-09 ENCOUNTER — Other Ambulatory Visit: Payer: Self-pay | Admitting: Internal Medicine

## 2010-10-09 ENCOUNTER — Other Ambulatory Visit: Payer: Self-pay | Admitting: *Deleted

## 2010-10-09 ENCOUNTER — Ambulatory Visit (INDEPENDENT_AMBULATORY_CARE_PROVIDER_SITE_OTHER): Payer: BC Managed Care – PPO | Admitting: Internal Medicine

## 2010-10-09 DIAGNOSIS — N83201 Unspecified ovarian cyst, right side: Secondary | ICD-10-CM

## 2010-10-09 DIAGNOSIS — R102 Pelvic and perineal pain: Secondary | ICD-10-CM

## 2010-10-09 DIAGNOSIS — N949 Unspecified condition associated with female genital organs and menstrual cycle: Secondary | ICD-10-CM

## 2010-10-09 DIAGNOSIS — N83209 Unspecified ovarian cyst, unspecified side: Secondary | ICD-10-CM

## 2010-10-09 MED ORDER — DIPHENOXYLATE-ATROPINE 2.5-0.025 MG PO TABS: ORAL_TABLET | ORAL | Status: DC

## 2010-10-09 MED ORDER — GABAPENTIN 300 MG PO CAPS: 300.0000 mg | ORAL_CAPSULE | Freq: Three times a day (TID) | ORAL | Status: DC

## 2010-10-09 NOTE — Progress Notes (Signed)
Subjective:    Patient ID: [name] White, female    DOB: April 28, 1965, 45 y.o.   MRN: [id number]  HPI New to me but known to our practice/division - here to establish at [facility] office for PCP  complains of ongoing R pelvic and suprapubic pain Onset 04/2010 following gyn surgery 04/22/10 (R salpingectomy) - complicated with bladder injury (initially unrecognized, repaired with mini cystomy 3/9) eval by uro 06/18/10 - felt no residual bladder problems as cysto showed no extravasation - unlikely causing pain Continued eval by PCP office for same -->?appy on repeat imaging, tx for low level residual peritonitis with 10d cipro/flagyl - unimproved Then repeat CT with new R ov cyst, prev has followed L ov cyst since 03/2010 intervention Lastly eval by GI for same - no primary GI problem found - No gyn follow up since surgery complication RLQ pain worst with pressure (lying on abdomen) and with urination or bowel movement  - describes as "burning spasm" and "cramp like giving birth" Pain not releived with vicodin - brief improvement with percocet from last ED visit No back pain, no hematuria  - no vaginal bleeding No falls or ortho trauma - no radiation of pain into left side or chest or flank  Reviewed chronic medical issues: RLS - previously on gabapentin - changed to requip 12/2009 with good control of symptoms  IBS - multiple GI endo procedures for same - no recent change in bowels - no weight loss Depression/anxiety - symptoms exac by recent medical complications post op - taking meds as rx'd - no si/hi - chronic insomnia  Past Medical History  Diagnosis Date  . Depression   . Barrett's esophagus     egd 2007 + 2009 but none 2011 or 08/2010 egd  . Colon polyp     hyperplastic  . Grief reaction   . Chronic headache   . Tobacco abuse   . Migraine   . Restless leg   . IBS (irritable bowel syndrome)   . Hidradenitis suppurativa    Family History  Problem Relation Age of Onset  . COPD Mother    . Stroke Mother   . Coronary artery disease Mother   . Colon polyps Mother   . Breast cancer Mother   . Hypertension Father   . Colon polyps Brother   . Colon cancer Maternal Grandfather   . Colon polyps Maternal Grandfather   . Breast cancer Maternal Aunt   . Uterine cancer Cousin     x 2   History  Substance Use Topics  . Smoking status: Current Everyday Smoker -- 0.5 packs/day    Types: Cigarettes    Last Attempt to Quit: 04/22/2010  . Smokeless tobacco: Never Used  . Alcohol Use: Yes     rare    Review of Systems Constitutional: Negative for fever.  Respiratory: Negative for cough and shortness of breath.   Cardiovascular: Negative for chest pain.  Gastrointestinal: Negative for blood in stool Musculoskeletal: Negative for gait problem. no back, knee or hip pain. Skin: Negative for rash.  Neurological: Negative for dizziness.  No other specific complaints in a complete review of systems (except as listed in HPI above).     Objective:   Physical Exam BP 100/60  Pulse 92  Temp(Src) 98.2 F (36.8 C) (Oral)  Ht 5\' 6"  (1.676 m)  Wt 204 lb 3.2 oz (92.625 kg)  BMI 32.96 kg/m2  SpO2 96% Constitutional: She is overweight. She appears well-developed and well-nourished. No distress.  HENT: Head: Normocephalic and atraumatic. Ears: B TMs ok, no erythema or effusion; Nose: Nose normal.  Mouth/Throat: Oropharynx is clear and moist. No oropharyngeal exudate.  Eyes: Conjunctivae and EOM are normal. Pupils are equal, round, and reactive to light. No scleral icterus.  Neck: Normal range of motion. Neck supple. No JVD present. No thyromegaly present.  Cardiovascular: Normal rate, regular rhythm and normal heart sounds.  No murmur heard. No BLE edema. Pulmonary/Chest: Effort normal and breath sounds normal. No respiratory distress. She has no wheezes.  Abdominal: Obese. Soft. Bowel sounds are normal. no distension. No reproducible tenderness. no masses Pelvic - defer to  gyn Musculoskeletal: R hip with normal range of motion, nonpainful. No gross deformities Neurological: She is alert and oriented to person, place, and time. No cranial nerve deficit. Coordination normal.  Skin: Skin is warm and dry. No rash noted. No erythema.  Psychiatric: She has a normal mood and affect. Her behavior is normal. Judgment and thought content normal.   Lab Results  Component Value Date   WBC 5.7 10/05/2010   HGB 12.8 10/05/2010   HCT 38.4 10/05/2010   PLT 173 10/05/2010   CHOL 210* 04/28/2009   TRIG 100.0 04/28/2009   HDL 51.10 04/28/2009   LDLDIRECT 151.9 04/28/2009   ALT 19 08/06/2010   AST 17 08/06/2010   NA 138 10/05/2010   K 3.0* 10/05/2010   CL 105 10/05/2010   CREATININE 0.69 10/05/2010   BUN 9 10/05/2010   CO2 23 10/05/2010   TSH 1.34 04/28/2009   INR 1.07 04/30/2010       Assessment & Plan:  See problem list. Medications and labs reviewed today. Time spent with pt today 42 minutes, greater than 50% time spent counseling patient on R pelvic and suprapubic pain, RLS/depression and medication review. Also review of prior hospital/OR and imaging records and plans for new gynecologist (prev [facility]) + new pelvic US

## 2010-10-09 NOTE — Patient Instructions (Signed)
It was good to see you today. We have reviewed your prior records including labs and tests today start gabapentin for "nerve" pain as discussed - Your prescription(s) have been submitted to your pharmacy. Please take as directed and contact our office if you believe you are having problem(s) with the medication(s). Other Medications reviewed, no other changes at this time. we'll make referral to new gynecologist and for pelvic ultrasound to look at right ovary. Our office will contact you regarding appointment(s) once made. Please schedule followup in 6-8 weeks, call sooner if problems.

## 2010-10-09 NOTE — Assessment & Plan Note (Signed)
Ongoing since 03/2010 with pelvic surgery and urologic injury periop status post uro eval (grapey) at time of injury - pr reports "nothing more to do per urology" Now new R ovary cyst on 10/05/10 CT, not seen on  07/10/10 CT - measures 4.4cm Simultaneous improvement in L ovary cyst Arrange pelvic US to further characterize and refer to new gyn (prev bovard and remotely [facility]erson) Consider uro eval at [facility] gabapentin for neuropathic component of pain - erx done follow up 6-8 weeks to continue eval and tx

## 2010-10-12 ENCOUNTER — Ambulatory Visit
Admission: RE | Admit: 2010-10-12 | Discharge: 2010-10-12 | Disposition: A | Discharge status: Home / Self Care | Payer: BC Managed Care – PPO | Source: Ambulatory Visit | Attending: Internal Medicine | Admitting: Internal Medicine

## 2010-10-12 DIAGNOSIS — R102 Pelvic and perineal pain: Secondary | ICD-10-CM

## 2010-10-12 DIAGNOSIS — N83201 Unspecified ovarian cyst, right side: Secondary | ICD-10-CM

## 2010-10-24 ENCOUNTER — Other Ambulatory Visit: Payer: Self-pay | Admitting: Internal Medicine

## 2010-11-10 ENCOUNTER — Other Ambulatory Visit: Payer: Self-pay | Admitting: Internal Medicine

## 2010-11-10 MED ORDER — ROPINIROLE HCL 1 MG PO TABS: 1.0000 mg | ORAL_TABLET | Freq: Every day | ORAL | Status: DC

## 2010-11-11 ENCOUNTER — Encounter: Payer: Self-pay | Admitting: *Deleted

## 2010-11-20 ENCOUNTER — Encounter: Payer: Self-pay | Admitting: Internal Medicine

## 2010-11-20 ENCOUNTER — Ambulatory Visit (INDEPENDENT_AMBULATORY_CARE_PROVIDER_SITE_OTHER): Payer: BC Managed Care – PPO | Admitting: Internal Medicine

## 2010-11-20 VITALS — BP 108/78 | HR 83 | Ht 67.0 in | Wt 212.0 lb

## 2010-11-20 DIAGNOSIS — R933 Abnormal findings on diagnostic imaging of other parts of digestive tract: Secondary | ICD-10-CM

## 2010-11-20 DIAGNOSIS — R1031 Right lower quadrant pain: Secondary | ICD-10-CM

## 2010-11-20 NOTE — Patient Instructions (Addendum)
You have been given a separate informational sheet regarding your tobacco use, the importance of quitting and local resources to help you quit. CC:Dr Konrad Dolores, Dr Felicity Coyer

## 2010-11-20 NOTE — Progress Notes (Signed)
[name] White [birth year] MRN [id number]    History of Present Illness:  This is a 45 year old white female with a history of Barrett's esophagus on an upper endoscopy in 2007 and 2009. This was not confirmed in 2011 and 2012. She now has chronic right lower quadrant abdominal pain related to a bladder injury during a right hydrosalpinx resection in March 2012. She was recently found to have 2 ovarian cyst by Dr. Jennette Kettle who put her on birth control pills. Her most recent CT scan of the abdomen in May 2012 showed near-complete resolution of the inflammatory process and mesenteric edema in the right lower quadrant. A colonoscopy in June 2012 showed 2 polyps. She has no complaints as far as her GI tract is concerned.   Past Medical History  Diagnosis Date  . Depression   . Barrett's esophagus     egd 2007 + 2009 but none 2011 or 08/2010 egd  . Colon polyp     hyperplastic  . Grief reaction   . Chronic headache   . Tobacco abuse   . Migraine   . Restless leg   . IBS (irritable bowel syndrome)   . Hidradenitis suppurativa   . Ovarian cyst    Past Surgical History  Procedure Date  . Wrist surgery     right  . Cesarean section     x 2  . Cholecystectomy   . Tubal ligation   . Plantar fascitis     right foot  . Abdominal hysterectomy     secondary to endometriosis  . Laparoscopy 04-22-10    lysis of adhesions  . Elbow surgery     left  . Salpingectomy 04-22-10    right  . Laparoscopy 04-30-10     with mini laparotomy and cystotomy repair    reports that she has been smoking Cigarettes.  She has been smoking about .5 packs per day. She has quit using smokeless tobacco. She reports that she drinks alcohol. She reports that she does not use illicit drugs. family history includes Breast cancer in her maternal aunt and mother; COPD in her mother; Colon cancer in her maternal grandfather; Colon polyps in her brother, maternal grandfather, and mother; Coronary artery disease in her mother;  Hypertension in her father; Stroke in her mother; and Uterine cancer in her cousin. Allergies  Allergen Reactions  . Morphine And Related   . Penicillins     REACTION: hives        Review of Systems: Denies heartburn dysphagia or odynophagia. Negative for diarrhea or rectal bleeding  The remainder of the 10 point ROS is negative except as outlined in H&P   Physical Exam: General appearance  Well developed, in no distress. Eyes- non icteric. HEENT nontraumatic, normocephalic. Mouth no lesions, tongue papillated, no cheilosis. Neck supple without adenopathy, thyroid not enlarged, no carotid bruits, no JVD. Lungs Clear to auscultation bilaterally. Cor normal S1, normal S2, regular rhythm, no murmur,  quiet precordium. Abdomen: Mildly obese soft abdomen with diffuse tenderness across the lower abdomen in the left and right lower quadrants. No palpable mass and no rebound. Rectal: Not done Extremities no pedal edema. Skin no lesions. Neurological alert and oriented x 3. Psychological normal mood and affect.  Assessment and Plan:  Problem #1 right lower quadrant abdominal pain. She is still having complaints pertaining to the lower abdomen but the CT scan of the abdomen and colonoscopy do not confirm any inflammatory changes. Her current complaints may be functional, gynecological or related  to adhesions. She has an appointment with Dr. Jennette Kettle for followup.  Problem #2 Barrett's esophagus. This was not confirmed on the last two endoscopies. She will continue Omeprazole 40 mg twice a day.   11/20/2010 [name] White

## 2010-11-30 ENCOUNTER — Ambulatory Visit (INDEPENDENT_AMBULATORY_CARE_PROVIDER_SITE_OTHER): Payer: BC Managed Care – PPO | Admitting: Internal Medicine

## 2010-11-30 ENCOUNTER — Encounter: Payer: Self-pay | Admitting: Internal Medicine

## 2010-11-30 ENCOUNTER — Other Ambulatory Visit: Payer: Self-pay | Admitting: *Deleted

## 2010-11-30 VITALS — BP 112/84 | HR 83 | Temp 98.6°F | Ht 65.0 in | Wt 213.2 lb

## 2010-11-30 DIAGNOSIS — R102 Pelvic and perineal pain: Secondary | ICD-10-CM

## 2010-11-30 DIAGNOSIS — N949 Unspecified condition associated with female genital organs and menstrual cycle: Secondary | ICD-10-CM

## 2010-11-30 MED ORDER — GABAPENTIN 300 MG PO CAPS: 600.0000 mg | ORAL_CAPSULE | Freq: Three times a day (TID) | ORAL | Status: DC

## 2010-11-30 NOTE — Progress Notes (Signed)
Subjective:    Patient ID: [name] White, female    DOB: 1965/07/27, 45 y.o.   MRN: [id number]  HPI  Here for follow up - reviewed chronic medical issues:   ongoing R pelvic and suprapubic pain Onset 04/2010 following gyn surgery 04/22/10 (R salpingectomy) - complicated with bladder injury (initially unrecognized, repaired with mini cystomy 3/9) eval by uro 06/18/10 - felt no residual bladder problems as cysto showed no extravasation - unlikely causing pain eval by PCP office for same>?appy on repeat imaging>> tx for low level residual peritonitis with 10d cipro/flagyl - unimproved follow up  CT with new R ov cyst, now working with new gyn on same (started BCP for tx cyst) Lastly eval by GI for RLQ pain - no primary GI problem found - Started gabapentin 09/2010 for neuropathic pain 0-  Pain improved (50%) but not resolved pain worst with pressure (lying on abdomen, sitting) and with urination or bowel movement  - describes as "burning spasm" and "cramp like giving birth" Pain radiates in R labia and groin and inner thigh area>>RLQ at this time No back pain, no hematuria  - no vaginal bleeding  RLS - previously on gabapentin - changed to requip 12/2009 with good control of symptoms   IBS - multiple GI endo procedures for same - no recent change in bowels - no weight loss  Depression/anxiety - symptoms exac by 2012 medical complications post op - taking meds as rx'd - no si/hi - chronic insomnia  Past Medical History  Diagnosis Date  . Depression   . Barrett's esophagus     egd 2007 + 2009 but none 2011 or 08/2010 egd  . Colon polyp     hyperplastic  . Grief reaction   . Chronic headache   . Tobacco abuse   . Migraine   . Restless leg   . IBS (irritable bowel syndrome)   . Hidradenitis suppurativa   . Ovarian cyst     Review of Systems  Constitutional: Negative for fever.  Respiratory: Negative for cough and shortness of breath.   Cardiovascular: Negative for chest pain.      Objective:   Physical Exam  BP 112/84  Pulse 83  Temp(Src) 98.6 F (37 C) (Oral)  Ht 5\' 5"  (1.651 m)  Wt 213 lb 3.2 oz (96.707 kg)  BMI 35.48 kg/m2  SpO2 95% Constitutional: She is overweight. She appears well-developed and well-nourished. No distress. dtr at side Cardiovascular: Normal rate, regular rhythm and normal heart sounds.  No murmur heard. No BLE edema. Pulmonary/Chest: Effort normal and breath sounds normal. No respiratory distress. She has no wheezes.  Abdominal: Obese. Soft. Bowel sounds are normal. no distension. No reproducible tenderness. no masses Pelvic - defer to gyn Psychiatric: She has a normal mood and affect. Her behavior is normal. Judgment and thought content normal.   Lab Results  Component Value Date   WBC 5.7 10/05/2010   HGB 12.8 10/05/2010   HCT 38.4 10/05/2010   PLT 173 10/05/2010   CHOL 210* 04/28/2009   TRIG 100.0 04/28/2009   HDL 51.10 04/28/2009   LDLDIRECT 151.9 04/28/2009   ALT 19 08/06/2010   AST 17 08/06/2010   NA 138 10/05/2010   K 3.0* 10/05/2010   CL 105 10/05/2010   CREATININE 0.69 10/05/2010   BUN 9 10/05/2010   CO2 23 10/05/2010   TSH 1.34 04/28/2009   INR 1.07 04/30/2010       Assessment & Plan:  See problem list. Medications and  labs reviewed today.

## 2010-11-30 NOTE — Assessment & Plan Note (Signed)
Ongoing since 03/2010 with pelvic surgery and urologic injury periop status post uro eval (grapey) at time of injury - pr reports "nothing more to do per urology" Now new R ovary cyst on 10/05/10 CT, not seen on  07/10/10 CT - measures 4.4cm Following with new gyn (neal) for same but doubt contributing to current pain symptoms  Started gabapentin for neuropathic component of pain in R pelvic region - improved but not resolved -  Increase dose now and refer to Dr. 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

## 2010-11-30 NOTE — Patient Instructions (Signed)
It was good to see you today. Increase dose gabapentin for "nerve" pain as discussed - Your prescription(s) have been submitted to your pharmacy. Please take as directed and contact our office if you believe you are having problem(s) with the medication(s). Other Medications reviewed, no other changes at this time. we'll make referral to Dr. Vear Clock for pain evaluation. Our office will contact you regarding appointment(s) once made. Please schedule followup in 4 months, call sooner if problems.

## 2010-11-30 NOTE — Telephone Encounter (Signed)
Yes - reorder done

## 2010-11-30 NOTE — Telephone Encounter (Signed)
Pt states she fail to ask md to rx something for her nerves. She states she lost her job on Friday. Use to take alprazolam would like to go back on it. Pls advise?

## 2010-12-01 MED ORDER — ALPRAZOLAM 0.25 MG PO TABS: 0.2500 mg | ORAL_TABLET | Freq: Three times a day (TID) | ORAL | Status: DC | PRN

## 2010-12-01 NOTE — Telephone Encounter (Signed)
Notified pt md ok refill. Faxed script to pharmacy....12/01/10@9 :00am/LMB

## 2010-12-06 ENCOUNTER — Other Ambulatory Visit: Payer: Self-pay | Admitting: Internal Medicine

## 2010-12-10 ENCOUNTER — Telehealth: Payer: Self-pay | Admitting: Internal Medicine

## 2010-12-10 MED ORDER — VENLAFAXINE HCL ER 150 MG PO CP24: 150.0000 mg | ORAL_CAPSULE | Freq: Every day | ORAL | Status: DC

## 2010-12-10 NOTE — Telephone Encounter (Signed)
Refill- venlafaxine hcl er 150mg  cap. Take one capsule by mouth once a day. Qty 90. Last fill 7.9.12

## 2011-03-11 ENCOUNTER — Other Ambulatory Visit: Payer: Self-pay | Admitting: Internal Medicine

## 2011-03-17 ENCOUNTER — Other Ambulatory Visit: Payer: Self-pay | Admitting: Internal Medicine

## 2011-03-19 ENCOUNTER — Encounter: Payer: Self-pay | Admitting: Internal Medicine

## 2011-03-19 ENCOUNTER — Ambulatory Visit (INDEPENDENT_AMBULATORY_CARE_PROVIDER_SITE_OTHER): Payer: BC Managed Care – PPO | Admitting: Internal Medicine

## 2011-03-19 VITALS — BP 100/62 | HR 97 | Temp 97.9°F | Wt 226.6 lb

## 2011-03-19 DIAGNOSIS — N949 Unspecified condition associated with female genital organs and menstrual cycle: Secondary | ICD-10-CM

## 2011-03-19 DIAGNOSIS — R102 Pelvic and perineal pain: Secondary | ICD-10-CM

## 2011-03-19 DIAGNOSIS — F32A Depression, unspecified: Secondary | ICD-10-CM

## 2011-03-19 DIAGNOSIS — J029 Acute pharyngitis, unspecified: Secondary | ICD-10-CM

## 2011-03-19 DIAGNOSIS — F329 Major depressive disorder, single episode, unspecified: Secondary | ICD-10-CM

## 2011-03-19 MED ORDER — HYDROCODONE-HOMATROPINE 5-1.5 MG/5ML PO SYRP: 5.0000 mL | ORAL_SOLUTION | Freq: Four times a day (QID) | ORAL | Status: AC | PRN

## 2011-03-19 MED ORDER — AZITHROMYCIN 250 MG PO TABS: ORAL_TABLET | ORAL | Status: AC

## 2011-03-19 NOTE — Progress Notes (Signed)
  Subjective:    HPI  complains of cold symptoms  Onset >1 week ago, wax/wane symptoms  associated with rhinorrhea, sneezing, sore throat, mild headache and low grade fever Also myalgias, sinus pressure and mild-mod chest congestion No relief with OTC meds Precipitated by sick contacts  Past Medical History  Diagnosis Date  . Depression   . Barrett's esophagus     egd 2007 + 2009 but none 2011 or 08/2010 egd  . Colon polyp     hyperplastic  . Grief reaction   . Chronic headache   . Tobacco abuse   . Migraine   . Restless leg   . IBS (irritable bowel syndrome)   . Hidradenitis suppurativa   . Ovarian cyst     Review of Systems Constitutional: No night sweats, no unexpected weight change Pulmonary: No pleurisy or hemoptysis Cardiovascular: No chest pain or palpitations     Objective:   Physical Exam BP 100/62  Pulse 97  Temp(Src) 97.9 F (36.6 C) (Oral)  Wt 226 lb 9.6 oz (102.785 kg)  SpO2 97% GEN: mildly ill appearing and audible head/chest congestion, son at side HENT: NCAT, mild sinus tenderness bilaterally, nares with clear discharge, oropharynx mod erythema, B mild exudate Eyes: Vision grossly intact, no conjunctivitis Lungs: scattered bilateral rhonchi but no wheeze or crackle, no increased work of breathing Cardiovascular: Regular rate and rhythm, no bilateral edema      Assessment & Plan:  Viral URI >> acute exudative pharyngitis Cough, postnasal drip related to above    Empiric antibiotics prescribed due to symptom duration greater than 7 days Prescription cough suppression - new prescriptions done Symptomatic care with Tylenol or Advil, hydration and rest -  salt gargle advised as needed

## 2011-03-19 NOTE — Patient Instructions (Addendum)
It was good to see you today. Zpak antibiotics and hydrocodone cough syrup - Your prescription(s) have been submitted to your pharmacy. Please take as directed and contact our office if you believe you are having problem(s) with the medication(s). Continue using other over-the-counter medication for symptomatic relief as needed Keep hydrated, if any of rest and use warm saltwater gargle as needed Call sooner if symptoms worse or unimproved in the next 10-14 days Followup in April on depression and other issues, call sooner if other problems

## 2011-03-21 DIAGNOSIS — F329 Major depressive disorder, single episode, unspecified: Secondary | ICD-10-CM | POA: Insufficient documentation

## 2011-03-21 NOTE — Assessment & Plan Note (Signed)
Ongoing since 03/2010 with pelvic surgery and urologic injury periop status post uro eval (grapey) at time of injury - pr reports "nothing more to do per urology" then new R ovary cyst on 10/05/10 CT, not seen on  07/10/10 CT - measures 4.4cm Following with new gyn (neal) for same >>endometriosis, ? contributing to current pain symptoms  Ongoing eval at [facility] gyn - planning BSO 03/2011 due to pain symptoms  Started gabapentin for neuropathic component of pain in R pelvic region 09/2010 - improved but not resolved - dose increased 11/2010 Also working with Dr. 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

## 2011-03-21 NOTE — Assessment & Plan Note (Signed)
symptoms exac by ongoing chronic RLQ pain - see next Feels efexor not as effectove as she would like - Denies SI/HI at this time or acute declines recommended continue with gyn surgery 03/2011 as planned and follow up here post op to reeval symptoms, call sooner if symptoms worse

## 2011-04-05 ENCOUNTER — Ambulatory Visit: Payer: BC Managed Care – PPO | Admitting: Internal Medicine

## 2011-04-14 ENCOUNTER — Other Ambulatory Visit: Payer: Self-pay | Admitting: Internal Medicine

## 2011-06-04 ENCOUNTER — Ambulatory Visit: Payer: BC Managed Care – PPO | Admitting: Internal Medicine

## 2011-06-08 ENCOUNTER — Other Ambulatory Visit: Payer: Self-pay | Admitting: *Deleted

## 2011-06-08 NOTE — Telephone Encounter (Signed)
Faxed reqeust for refill on ondansetron.  Pt due for follow up.  OK to fill or do you want to see patient prior to any refills/  Please advise.

## 2011-06-09 MED ORDER — ONDANSETRON HCL 8 MG PO TABS: 8.0000 mg | ORAL_TABLET | Freq: Three times a day (TID) | ORAL | Status: DC | PRN

## 2011-06-09 NOTE — Telephone Encounter (Signed)
Pt last seen 02/2011 - ok to refill

## 2011-06-18 ENCOUNTER — Ambulatory Visit (INDEPENDENT_AMBULATORY_CARE_PROVIDER_SITE_OTHER): Payer: BC Managed Care – PPO | Admitting: Internal Medicine

## 2011-06-18 ENCOUNTER — Other Ambulatory Visit: Payer: Self-pay | Admitting: Internal Medicine

## 2011-06-18 ENCOUNTER — Encounter: Payer: Self-pay | Admitting: Internal Medicine

## 2011-06-18 VITALS — BP 100/78 | HR 80 | Temp 97.0°F | Resp 16 | Wt 224.8 lb

## 2011-06-18 DIAGNOSIS — R102 Pelvic and perineal pain: Secondary | ICD-10-CM

## 2011-06-18 DIAGNOSIS — F32A Depression, unspecified: Secondary | ICD-10-CM

## 2011-06-18 DIAGNOSIS — F329 Major depressive disorder, single episode, unspecified: Secondary | ICD-10-CM

## 2011-06-18 DIAGNOSIS — N949 Unspecified condition associated with female genital organs and menstrual cycle: Secondary | ICD-10-CM

## 2011-06-18 MED ORDER — VENLAFAXINE HCL ER 75 MG PO CP24: 75.0000 mg | ORAL_CAPSULE | Freq: Every day | ORAL | Status: DC

## 2011-06-18 MED ORDER — ROPINIROLE HCL 2 MG PO TABS: 2.0000 mg | ORAL_TABLET | Freq: Every day | ORAL | Status: DC

## 2011-06-18 MED ORDER — ALPRAZOLAM 0.25 MG PO TABS: 0.2500 mg | ORAL_TABLET | Freq: Three times a day (TID) | ORAL | Status: DC | PRN

## 2011-06-18 NOTE — Assessment & Plan Note (Signed)
Ongoing since 03/2010 with pelvic surgery and urologic injury periop status post uro eval (grapey) at time of injury - pr reports "nothing more to do per urology" then new R ovary cyst on 10/05/10 CT, not seen on  07/10/10 CT - measures 4.4cm Following with new gyn (neal) for same >> ?endometriosis contributing to current pain symptoms  s/p eval at [facility] gyn s/p BSO 03/2011 due to pain symptoms  Started gabapentin for neuropathic component of pain in R pelvic region 09/2010 - improved but not resolved  Also working with Dr. 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

## 2011-06-18 NOTE — Progress Notes (Signed)
  Subjective:    Patient ID: [name] White, female    DOB: [birth year], 46 y.o.   MRN: [id number]  HPI  Here for follow up -  Depression/anxiety - symptoms exac by 2012 medical complications post op - taking meds as rx'd - no si/hi - chronic insomnia - wants to waen off effexor  Also ?other med for RLS than requip?  Past Medical History  Diagnosis Date  . Depression   . Barrett's esophagus     egd 2007 + 2009 but none 2011 or 08/2010 egd  . Colon polyp     hyperplastic  . Grief reaction   . Chronic headache   . Tobacco abuse   . Migraine   . Restless leg   . IBS (irritable bowel syndrome)   . Hidradenitis suppurativa   . Ovarian cyst     Review of Systems  Constitutional: Negative for fever or unexpected weight gain.  Respiratory: Negative for cough and shortness of breath.   Cardiovascular: Negative for chest pain.      Objective:   Physical Exam  BP 100/78  Pulse 80  Temp(Src) 97 F (36.1 C) (Oral)  Resp 16  Wt 224 lb 12 oz (101.946 kg)  SpO2 98% Wt Readings from Last 3 Encounters:  06/18/11 224 lb 12 oz (101.946 kg)  03/19/11 226 lb 9.6 oz (102.785 kg)  11/30/10 213 lb 3.2 oz (96.707 kg)   Constitutional: She is overweight, but appears well-developed and well-nourished. No distress. dtr at side Cardiovascular: Normal rate, regular rhythm and normal heart sounds.  No murmur heard. No BLE edema. Pulmonary/Chest: Effort normal and breath sounds normal. No respiratory distress. She has no wheezes.  Abdominal: Obese. Soft. Bowel sounds are normal. no distension. No reproducible tenderness. no masses Psychiatric: She has a dysphoroic mood and affect. Her behavior is normal. Judgment and thought content normal.   Lab Results  Component Value Date   WBC 5.7 10/05/2010   HGB 12.8 10/05/2010   HCT 38.4 10/05/2010   PLT 173 10/05/2010   CHOL 210* 04/28/2009   TRIG 100.0 04/28/2009   HDL 51.10 04/28/2009   LDLDIRECT 151.9 04/28/2009   ALT 19 08/06/2010   AST 17 08/06/2010   NA 138 10/05/2010   K 3.0* 10/05/2010   CL 105 10/05/2010   CREATININE 0.69 10/05/2010   BUN 9 10/05/2010   CO2 23 10/05/2010   TSH 1.34 04/28/2009   INR 1.07 04/30/2010       Assessment & Plan:  See problem list. Medications and labs reviewed today.

## 2011-06-18 NOTE — Assessment & Plan Note (Signed)
symptoms exac by ongoing chronic RLQ pain - see next Feels effexor not as effective as she would like - requests to wean off same Taper to 1/2 dose now for 4-6 weeks, then qod for 2-4 weeks, then stop Denies SI/HI at this time or acute flare Resume prn low dose xanax and pt agrees to call if symptoms worse during taper off meds s/p gyn surgery 03/2011> reviewed same

## 2011-06-18 NOTE — Patient Instructions (Signed)
It was good to see you today. We have reviewed your interval history today Decrease Effexor to 75 mg daily as discussed Use xanax as needed/if needed for anxiety during this time Increase Requip to 2mg  at bedtime Your prescription(s) have been submitted to your pharmacy. Please take as directed and contact our office if you believe you are having problem(s) with the medication(s). Please schedule followup in 3 months to review, call sooner if problems.

## 2011-08-16 ENCOUNTER — Other Ambulatory Visit: Payer: Self-pay | Admitting: Internal Medicine

## 2011-08-23 ENCOUNTER — Other Ambulatory Visit (INDEPENDENT_AMBULATORY_CARE_PROVIDER_SITE_OTHER): Payer: BC Managed Care – PPO

## 2011-08-23 ENCOUNTER — Encounter: Payer: Self-pay | Admitting: Internal Medicine

## 2011-08-23 ENCOUNTER — Ambulatory Visit (INDEPENDENT_AMBULATORY_CARE_PROVIDER_SITE_OTHER): Payer: BC Managed Care – PPO | Admitting: Internal Medicine

## 2011-08-23 VITALS — BP 110/62 | HR 71 | Temp 98.6°F | Ht 66.5 in | Wt 217.8 lb

## 2011-08-23 DIAGNOSIS — F32A Depression, unspecified: Secondary | ICD-10-CM

## 2011-08-23 DIAGNOSIS — F329 Major depressive disorder, single episode, unspecified: Secondary | ICD-10-CM

## 2011-08-23 DIAGNOSIS — R102 Pelvic and perineal pain: Secondary | ICD-10-CM

## 2011-08-23 DIAGNOSIS — R5381 Other malaise: Secondary | ICD-10-CM

## 2011-08-23 DIAGNOSIS — G479 Sleep disorder, unspecified: Secondary | ICD-10-CM

## 2011-08-23 DIAGNOSIS — N949 Unspecified condition associated with female genital organs and menstrual cycle: Secondary | ICD-10-CM

## 2011-08-23 DIAGNOSIS — R5383 Other fatigue: Secondary | ICD-10-CM

## 2011-08-23 DIAGNOSIS — G4733 Obstructive sleep apnea (adult) (pediatric): Secondary | ICD-10-CM | POA: Insufficient documentation

## 2011-08-23 LAB — CBC WITH DIFFERENTIAL/PLATELET
Basophils Relative: 0.7 % (ref 0.0–3.0)
Eosinophils Relative: 2.2 % (ref 0.0–5.0)
HCT: 41.1 % (ref 36.0–46.0)
Hemoglobin: 13.8 g/dL (ref 12.0–15.0)
Lymphs Abs: 2.7 10*3/uL (ref 0.7–4.0)
Monocytes Relative: 9.1 % (ref 3.0–12.0)
Platelets: 180 10*3/uL (ref 150.0–400.0)
RBC: 4.35 Mil/uL (ref 3.87–5.11)
WBC: 5.7 10*3/uL (ref 4.5–10.5)

## 2011-08-23 LAB — HEPATIC FUNCTION PANEL
ALT: 25 U/L (ref 0–35)
Albumin: 3.9 g/dL (ref 3.5–5.2)
Total Protein: 7.5 g/dL (ref 6.0–8.3)

## 2011-08-23 LAB — BASIC METABOLIC PANEL
GFR: 92.34 mL/min (ref >60.00)
Potassium: 4.3 mEq/L (ref 3.5–5.1)
Sodium: 139 mEq/L (ref 135–145)

## 2011-08-23 NOTE — Patient Instructions (Signed)
It was good to see you today. We have reviewed your interval history today we'll make referral to sleep specialist. Our office will contact you regarding appointment(s) once made. Test(s) ordered today. Your results will be called to you after review (48-72hours after test completion). If any changes need to be made, you will be notified at that time. Medications reviewed, no changes at this time. Please schedule followup in 3 months to review, call sooner if problems.

## 2011-08-23 NOTE — Assessment & Plan Note (Addendum)
Ongoing since 03/2010 with pelvic surgery and urologic injury periop status post uro eval (grapey) at time of injury - pr reports "nothing more to do per urology" then new R ovary cyst on 10/05/10 CT, not seen on  07/10/10 CT - s/p new gyn (neal) for eval same >>felt to be endometriosis contributing to current pain symptoms  s/p eval at [facility] gyn> s/p BSO 03/2011 due to pain symptoms  Started gabapentin for neuropathic component of pain in R pelvic region 09/2010 - improved but not resolved  Also working with Dr. 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

## 2011-08-23 NOTE — Assessment & Plan Note (Signed)
non restful sleep and progressive daytime fatigue Mild OSA on 09/2009 study Refer to sleep/pulm for eval and tx of same

## 2011-08-23 NOTE — Assessment & Plan Note (Signed)
symptoms exac by ongoing chronic RLQ pain - see next on effexor - tapered down 05/2011 Denies SI/HI at this time or acute flare continue prn low dose xanax  s/p gyn surgery 03/2011> reviewed same

## 2011-08-23 NOTE — Progress Notes (Signed)
  Subjective:    Patient ID: [name] White, female    DOB: 04-21-1965, 46 y.o.   MRN: [id number]  HPI  Here for follow up -   Depression/anxiety - symptoms exac by 2012 medical complications post op -  no si/hi - chronic insomnia - decreased dose spring 2013 effexor  complains of fatigue - poor sleep, daytime somnolence     Past Medical History  Diagnosis Date  . Depression   . Barrett's esophagus     egd 2007 + 2009 but none 2011 or 08/2010 egd  . Colon polyp     hyperplastic  . Grief reaction   . Chronic headache   . Tobacco abuse   . Migraine   . Restless leg   . IBS (irritable bowel syndrome)   . Hidradenitis suppurativa   . Ovarian cyst     Review of Systems  Constitutional: Negative for fever or unexpected weight gain.  Respiratory: Negative for cough and shortness of breath.   Cardiovascular: Negative for chest pain.      Objective:   Physical Exam  BP 110/62  Pulse 71  Temp 98.6 F (37 C) (Oral)  Ht 5' 6.5" (1.689 m)  Wt 217 lb 12.8 oz (98.793 kg)  BMI 34.63 kg/m2  SpO2 97% Wt Readings from Last 3 Encounters:  08/23/11 217 lb 12.8 oz (98.793 kg)  06/18/11 224 lb 12 oz (101.946 kg)  03/19/11 226 lb 9.6 oz (102.785 kg)   Constitutional: She is overweight, but appears well-developed and well-nourished. No distress. Neck: thick, no thyroid nodules or enlargement  Cardiovascular: Normal rate, regular rhythm and normal heart sounds.  No murmur heard. No BLE edema. Pulmonary/Chest: Effort normal and breath sounds normal. No respiratory distress. She has no wheezes.  Psychiatric: She has brighter mood and affect. Her behavior is normal. Judgment and thought content normal.   Lab Results  Component Value Date   WBC 5.7 10/05/2010   HGB 12.8 10/05/2010   HCT 38.4 10/05/2010   PLT 173 10/05/2010   CHOL 210* 04/28/2009   TRIG 100.0 04/28/2009   HDL 51.10 04/28/2009   LDLDIRECT 151.9 04/28/2009   ALT 19 08/06/2010   AST 17 08/06/2010   NA 138 10/05/2010   K 3.0*  10/05/2010   CL 105 10/05/2010   CREATININE 0.69 10/05/2010   BUN 9 10/05/2010   CO2 23 10/05/2010   TSH 1.34 04/28/2009   INR 1.07 04/30/2010       Assessment & Plan:  See problem list. Medications and labs reviewed today.  Fatigue - nonspecific hx and exam - check labs and refer to sleep for ?OSA follow up

## 2011-09-10 ENCOUNTER — Other Ambulatory Visit: Payer: Self-pay

## 2011-09-10 MED ORDER — ALPRAZOLAM 0.25 MG PO TABS: 0.2500 mg | ORAL_TABLET | Freq: Three times a day (TID) | ORAL | Status: DC | PRN

## 2011-09-10 NOTE — Telephone Encounter (Signed)
Faxed

## 2011-09-10 NOTE — Telephone Encounter (Signed)
Please advise Alprazolam refill? Last RX wrote on 06-18-11 quantity 30 with 1 refill. Fax to [phone]

## 2011-09-15 ENCOUNTER — Ambulatory Visit: Payer: BC Managed Care – PPO | Admitting: Internal Medicine

## 2011-10-04 ENCOUNTER — Encounter: Payer: Self-pay | Admitting: Pulmonary Disease

## 2011-10-04 ENCOUNTER — Ambulatory Visit (INDEPENDENT_AMBULATORY_CARE_PROVIDER_SITE_OTHER): Payer: BC Managed Care – PPO | Admitting: Pulmonary Disease

## 2011-10-04 VITALS — BP 106/60 | HR 73 | Temp 98.4°F | Ht 66.0 in | Wt 217.4 lb

## 2011-10-04 DIAGNOSIS — G479 Sleep disorder, unspecified: Secondary | ICD-10-CM

## 2011-10-04 NOTE — Progress Notes (Deleted)
  Subjective:    Patient ID: [name] White, female    DOB: 1965-05-08, 46 y.o.   MRN: [id number]  HPI    Review of Systems  Constitutional: Positive for appetite change and unexpected weight change.  HENT: Positive for trouble swallowing. Negative for ear pain, congestion, sore throat, sneezing and dental problem.   Respiratory: Positive for shortness of breath. Negative for cough.   Cardiovascular: Positive for leg swelling. Negative for chest pain and palpitations.  Musculoskeletal: Positive for joint swelling.  Skin: Negative for rash.  Neurological: Positive for headaches.  Psychiatric/Behavioral: Positive for dysphoric mood. The patient is nervous/anxious.        Objective:   Physical Exam        Assessment & Plan:

## 2011-10-04 NOTE — Assessment & Plan Note (Signed)
I am concerned she could have sleep apnea contributing to her sleep problems.  She has increased weight since study in 2011.  She also is using several medicines now that could be contributing to her sleep apnea.  I have explained how sleep apnea can affect the patient's health.  Driving precautions and importance of weight loss were discussed.  Treatment options for sleep apnea were reviewed.  Will arrange for in lab study to further assess.

## 2011-10-04 NOTE — Progress Notes (Signed)
Chief Complaint  Patient presents with  . sleep consult    can go 36 hrs or more w/o sleep. then sometimes she can sleep 3-4 hrs at a time    History of Present Illness: [name] White is a 46 y.o. female for evaluation of sleep troubles.  This has been present for years.  She had sleep study 09/26/09 and was told she had mild sleep apnea and increase PLMI.  She was started on requip for restless leg syndrome.  She has trouble falling asleep and staying asleep, but then feels sleep during the day.  She can fall asleep at different times of the day, and then sleep for 1 to 2 hours.    She tries to go to bed at 11 pm.  She takes dilaudid and xanax on a regular basis.  She gets out of bed at 6 am.  She is not using anything to help her stay awake.    She snores, and has been told she stops breathing while asleep.  Her leg symptoms are better since using requip.  She also has right leg pain after surgical accident during removal of one of her ovaries.  She has gained about 20 lbs since her original sleep study.  The patient denies sleep walking, sleep talking, bruxism, or nightmares.  The patient denies sleep hallucinations, sleep paralysis, or cataplexy.  Her Epworth score is 15 out of 24.   Past Medical History  Diagnosis Date  . Depression   . Barrett's esophagus     egd 2007 + 2009 but none 2011 or 08/2010 egd  . Colon polyp     hyperplastic  . Grief reaction   . Chronic headache   . Tobacco abuse   . Migraine   . Restless leg   . IBS (irritable bowel syndrome)   . Hidradenitis suppurativa   . Ovarian cyst     Past Surgical History  Procedure Date  . Wrist surgery     right  . Cesarean section     x 2  . Cholecystectomy   . Tubal ligation   . Plantar fascitis     right foot  . Abdominal hysterectomy     secondary to endometriosis  . Laparoscopy 04-22-10    lysis of adhesions  . Elbow surgery     left  . Salpingectomy 04-22-10    right  . Laparoscopy 04-30-10    with mini laparotomy and cystotomy repair    Current Outpatient Prescriptions on File Prior to Visit  Medication Sig Dispense Refill  . ALPRAZolam (XANAX) 0.25 MG tablet Take 1 tablet (0.25 mg total) by mouth 3 (three) times daily as needed for sleep or anxiety.  30 tablet  1  . estradiol (ESTRACE) 1 MG tablet Take 1 mg by mouth daily.      Marland Kitchen GRALISE 600 MG TABS 3 times a day      . HYDROmorphone (DILAUDID) 4 MG tablet Take 4 mg by mouth every 4 (four) hours as needed. Every 4-6 hrs PRN Pain.      Marland Kitchen omeprazole (PRILOSEC) 40 MG capsule TAKE 1 CAPSULE (40 MG TOTAL) BY MOUTH 2 (TWO) TIMES DAILY.  60 capsule  3  . ondansetron (ZOFRAN) 8 MG tablet Take 1 tablet (8 mg total) by mouth every 8 (eight) hours as needed for nausea.  30 tablet  2  . rOPINIRole (REQUIP) 2 MG tablet Take 1 tablet (2 mg total) by mouth at bedtime.  30 tablet  5  .  venlafaxine XR (EFFEXOR-XR) 75 MG 24 hr capsule Take 1 capsule (75 mg total) by mouth daily.  90 capsule  1    Allergies  Allergen Reactions  . Morphine And Related   . Penicillins     REACTION: hives    Family History  Problem Relation Age of Onset  . COPD Mother   . Stroke Mother   . Coronary artery disease Mother   . Colon polyps Mother   . Breast cancer Mother   . Hypertension Father   . Colon polyps Brother   . Colon cancer Maternal Grandfather   . Colon polyps Maternal Grandfather   . Breast cancer Maternal Aunt   . Uterine cancer Cousin     x 2    History  Substance Use Topics  . Smoking status: Current Everyday Smoker -- 1.0 packs/day for 30 years    Types: Cigarettes  . Smokeless tobacco: Former Neurosurgeon   Comment: BUT NOT READY  . Alcohol Use: Yes     rare    Review of Systems  Constitutional: Positive for appetite change and unexpected weight change.  HENT: Positive for trouble swallowing. Negative for ear pain, congestion, sore throat, sneezing and dental problem.   Respiratory: Positive for shortness of breath. Negative for  cough.   Cardiovascular: Positive for leg swelling. Negative for chest pain and palpitations.  Musculoskeletal: Positive for joint swelling.  Skin: Negative for rash.  Neurological: Positive for headaches.  Psychiatric/Behavioral: Positive for dysphoric mood. The patient is nervous/anxious.     Physical Exam: Filed Vitals:   10/04/11 1645  BP: 106/60  Pulse: 73  Temp: 98.4 F (36.9 C)  TempSrc: Oral  Height: 5\' 6"  (1.676 m)  Weight: 217 lb 6.4 oz (98.612 kg)  SpO2: 94%  ,  Current Encounter SPO2  10/04/11 1645 94%  08/23/11 1450 97%  06/18/11 1018 98%    Wt Readings from Last 3 Encounters:  10/04/11 217 lb 6.4 oz (98.612 kg)  08/23/11 217 lb 12.8 oz (98.793 kg)  06/18/11 224 lb 12 oz (101.946 kg)    Body mass index is 35.09 kg/(m^2).   General - No distress ENT - TM clear, no sinus tenderness, no oral exudate, no LAN, no thyromegaly Cardiac - s1s2 regular, no murmur, pulses symmetric, no edema Chest - normal respiratory excursion, good air entry, no wheeze/rales/dullness Back - no focal tenderness Abd - soft, non-tender, no organomegaly, + bowel sounds Ext - normal motor strength Neuro - Cranial nerves are normal. PERLA. EOM's intact. Skin - no discernible active dermatitis, erythema, urticaria or inflammatory process. Psych - normal mood, and behavior.   Lab Results  Component Value Date   WBC 5.7 08/23/2011   HGB 13.8 08/23/2011   HCT 41.1 08/23/2011   MCV 94.4 08/23/2011   PLT 180.0 08/23/2011    Lab Results  Component Value Date   CREATININE 0.7 08/23/2011   BUN 10 08/23/2011   NA 139 08/23/2011   K 4.3 08/23/2011   CL 104 08/23/2011   CO2 26 08/23/2011    Lab Results  Component Value Date   ALT 25 08/23/2011   AST 24 08/23/2011   ALKPHOS 54 08/23/2011   BILITOT 0.6 08/23/2011    Lab Results  Component Value Date   TSH 0.60 08/23/2011    Assessment/Plan:  Coralyn Helling, MD [facility]/Sleep Pager:  [phone] 10/04/2011, 5:46 PM

## 2011-10-04 NOTE — Patient Instructions (Signed)
Will arrange for sleep study Will call to schedule follow up after sleep study reviewed 

## 2011-10-07 ENCOUNTER — Ambulatory Visit (HOSPITAL_BASED_OUTPATIENT_CLINIC_OR_DEPARTMENT_OTHER): Payer: BC Managed Care – PPO | Attending: Pulmonary Disease

## 2011-10-07 VITALS — Ht 66.0 in | Wt 217.0 lb

## 2011-10-07 DIAGNOSIS — G4733 Obstructive sleep apnea (adult) (pediatric): Secondary | ICD-10-CM

## 2011-10-07 DIAGNOSIS — G479 Sleep disorder, unspecified: Secondary | ICD-10-CM

## 2011-10-07 DIAGNOSIS — G2581 Restless legs syndrome: Secondary | ICD-10-CM | POA: Insufficient documentation

## 2011-10-07 DIAGNOSIS — Z79899 Other long term (current) drug therapy: Secondary | ICD-10-CM | POA: Insufficient documentation

## 2011-10-10 ENCOUNTER — Telehealth: Payer: Self-pay | Admitting: Pulmonary Disease

## 2011-10-10 DIAGNOSIS — G479 Sleep disorder, unspecified: Secondary | ICD-10-CM

## 2011-10-10 DIAGNOSIS — G4733 Obstructive sleep apnea (adult) (pediatric): Secondary | ICD-10-CM

## 2011-10-10 NOTE — Telephone Encounter (Signed)
PSG 10/07/11>>AHI 17.8, SpO2 low 86%, PLMI 10.2  Moderate OSA.  Will have my nurse schedule ROV to review results.

## 2011-10-11 ENCOUNTER — Telehealth: Payer: Self-pay | Admitting: Pulmonary Disease

## 2011-10-11 NOTE — Telephone Encounter (Signed)
lmomtcb x1 for pt to schedule rov to discuss sleep study

## 2011-10-11 NOTE — Telephone Encounter (Signed)
lmomtcb x 2  

## 2011-10-11 NOTE — Procedures (Signed)
NAME:  Graig, [name] White                ACCOUNT NO.:  [id number]  MEDICAL RECORD NO.:  [id number]          PATIENT TYPE:  OUT  LOCATION:  SLEEP CENTER                 FACILITY:  [facility]  PHYSICIAN:  Coralyn Helling, MD        DATE OF BIRTH:  1965/04/26  DATE OF STUDY:                           NOCTURNAL POLYSOMNOGRAM  REFERRING PHYSICIAN:  Coralyn Helling, MD  INDICATION FOR STUDY:  Ms. Armistead is a 46 year old female, who has a history of depression, chronic headache, and restless legs syndrome. She also has symptoms of snoring, sleep disturbance, and daytime sleepiness.  She did have a previous sleep study, which showed mild obstructive sleep apnea with an apnea-hypopnea index of 5 and oxygen saturation nadir of 88% from September 26, 2009.  She is referred Sleep Lab for further evaluation of hypersomnia with obstructive sleep apnea.  Height is 5 feet and 6 inches, weight is 217 pounds, BMI is 35, neck size is 15 inches.  EPWORTH SLEEPINESS SCORE:  19.  MEDICATIONS:  Acetaminophen, alprazolam, estradiol, hydromorphone, omeprazole, Zofran, ropinirole, and venlafaxine.  SLEEP ARCHITECTURE:  Total recording time was 401 minutes, total sleep time was 283 minutes, sleep efficiency was 30%, sleep latency was 68 minutes, REM latency was 117 minutes.  The study was notable for lack of slow-wave sleep and she slept both supine and nonsupine position.  RESPIRATORY DATA:  The average respiratory rate was 18.  Loud snoring was noted by the technician.  The overall apnea-hypopnea index was 17.8. There were 4 central apneic events.  The remainder of the events were obstructive in nature.  The REM apnea-hypopnea index was 23.6.  The supine apnea-hypopnea index was 22.2.  OXYGEN DATA:  The baseline oxygenation was 96%.  The oxygen saturation nadir was 86%.  The patient spent a total of 2.5 minutes with an oxygen saturation below 88%.  The study was conducted without the use of supplemental oxygen.  CARDIAC  DATA:  The average heart rate was 75 and the rhythm strip showed normal sinus rhythm.  MOVEMENT-PARASOMNIA:  The periodic limb movement index was 10 and the patient had 1 restroom trip.  IMPRESSIONS-RECOMMENDATIONS:  This study shows evidence for moderate obstructive sleep apnea.  Her overall apnea-hypopnea index was 17.8 with an oxygen saturation nadir of 86%.  She did have significant positional and REM effect to her sleep-disordered breathing.  In addition to diet, exercise, and weight reduction, I would recommend that she consider additional therapeutic options for her obstructive sleep apnea, which include CPAP therapy, oral appliance, or surgical intervention.     Coralyn Helling, MD Diplomat, American Board of Sleep Medicine    VS/MEDQ  D:  10/10/2011 13:52:25  T:  10/11/2011 02:27:14  Job:  [id number]

## 2011-10-11 NOTE — Telephone Encounter (Signed)
Duplicate message see phone 10/11/11

## 2011-10-12 NOTE — Telephone Encounter (Signed)
Lmomtcb x1 for pt offer 10/19/11 at 1:30 or 1:45

## 2011-10-12 NOTE — Telephone Encounter (Signed)
Pt returned Mindy's call.  I set an appt w/ VS 10/19/11 at 1:30 pm.  Pt verbalized understanding & stated nothing further needed at this time.  Antionette Fairy

## 2011-10-12 NOTE — Telephone Encounter (Signed)
lmomtcb x3 to schedule ROV to discuss sleep study

## 2011-10-12 NOTE — Telephone Encounter (Signed)
Pt returned Mindy's call.  Offered pt an appt w/ VS today @ 1200.  Pt was not able to accept this appt.  VS's next avail date is 11/03/11.  Pt requests to get an earlier date if possible.  Antionette Fairy

## 2011-10-19 ENCOUNTER — Encounter: Payer: Self-pay | Admitting: Pulmonary Disease

## 2011-10-19 ENCOUNTER — Ambulatory Visit (INDEPENDENT_AMBULATORY_CARE_PROVIDER_SITE_OTHER): Payer: BC Managed Care – PPO | Admitting: Pulmonary Disease

## 2011-10-19 VITALS — BP 120/70 | HR 87 | Temp 98.5°F | Ht 67.0 in | Wt 216.6 lb

## 2011-10-19 DIAGNOSIS — G4733 Obstructive sleep apnea (adult) (pediatric): Secondary | ICD-10-CM

## 2011-10-19 NOTE — Progress Notes (Signed)
Chief Complaint  Patient presents with  . Follow-up    here to discuss sleep study    History of Present Illness: [name] White is a 46 y.o. female with OSA.  She is here to review sleep study from 10/07/11>>AHI 17.8, SpO2 low 86%, PLMI 10.2.  Moderate OSA.   Past Medical History  Diagnosis Date  . Depression   . Barrett's esophagus     egd 2007 + 2009 but none 2011 or 08/2010 egd  . Colon polyp     hyperplastic  . Grief reaction   . Chronic headache   . Tobacco abuse   . Migraine   . Restless leg   . IBS (irritable bowel syndrome)   . Hidradenitis suppurativa   . Ovarian cyst   . OSA (obstructive sleep apnea)     Past Surgical History  Procedure Date  . Wrist surgery     right  . Cesarean section     x 2  . Cholecystectomy   . Tubal ligation   . Plantar fascitis     right foot  . Abdominal hysterectomy     secondary to endometriosis  . Laparoscopy 04-22-10    lysis of adhesions  . Elbow surgery     left  . Salpingectomy 04-22-10    right  . Laparoscopy 04-30-10     with mini laparotomy and cystotomy repair    Outpatient Encounter Prescriptions as of 10/19/2011  Medication Sig Dispense Refill  . acetaminophen (TYLENOL) 500 MG tablet Take 500 mg by mouth every 6 (six) hours as needed.      . ALPRAZolam (XANAX) 0.25 MG tablet Take 1 tablet (0.25 mg total) by mouth 3 (three) times daily as needed for sleep or anxiety.  30 tablet  1  . estradiol (ESTRACE) 1 MG tablet Take 1 mg by mouth daily.      Marland Kitchen GRALISE 600 MG TABS 3 tablets in the afternoon      . HYDROmorphone (DILAUDID) 4 MG tablet Take 4 mg by mouth every 4 (four) hours as needed. Every 4-6 hrs PRN Pain.      Marland Kitchen omeprazole (PRILOSEC) 40 MG capsule TAKE 1 CAPSULE (40 MG TOTAL) BY MOUTH 2 (TWO) TIMES DAILY.  60 capsule  3  . ondansetron (ZOFRAN) 8 MG tablet Take 1 tablet (8 mg total) by mouth every 8 (eight) hours as needed for nausea.  30 tablet  2  . rOPINIRole (REQUIP) 2 MG tablet Take 1 tablet (2 mg total)  by mouth at bedtime.  30 tablet  5  . venlafaxine XR (EFFEXOR-XR) 75 MG 24 hr capsule Take 1 capsule (75 mg total) by mouth daily.  90 capsule  1    Allergies  Allergen Reactions  . Morphine And Related   . Penicillins     REACTION: hives    Physical Exam:  Filed Vitals:   10/19/11 1329 10/19/11 1330  BP:  120/70  Pulse:  87  Temp: 98.5 F (36.9 C)   TempSrc: Oral   Height: 5\' 7"  (1.702 m)   Weight: 216 lb 9.6 oz (98.249 kg)   SpO2:  97%    Current Encounter SPO2  10/19/11 1330 97%  10/04/11 1645 94%  08/23/11 1450 97%    Body mass index is 33.92 kg/(m^2). Wt Readings from Last 2 Encounters:  10/19/11 216 lb 9.6 oz (98.249 kg)  10/07/11 217 lb (98.431 kg)    General - No distress  ENT - no sinus tenderness, no oral  exudate, no LAN, no thyromegaly  Cardiac - s1s2 regular, no murmur, no edema  Chest - normal respiratory excursion, good air entry, no wheeze/rales/dullness  Back - no focal tenderness  Abd - soft, non-tender, + bowel sounds  Ext - normal motor strength  Neuro - Cranial nerves are normal. PERLA. EOM's intact.  Skin - no rashes Psych - normal mood, and behavior.  Assessment/Plan:  Coralyn Helling, MD [facility]/Sleep Pager:  [phone] 10/19/2011, 1:31 PM

## 2011-10-19 NOTE — Assessment & Plan Note (Signed)
She has moderate obstructive sleep apnea.  I have reviewed her sleep test results with the patient.  Explained how sleep apnea can affect the patient's health.  Driving precautions and importance of weight loss were discussed.  Treatment options for sleep apnea were reviewed.  Will proceed with auto CPAP set up at home.  If this is unsuccessful, she would then need an in lab titration study.

## 2011-10-19 NOTE — Patient Instructions (Signed)
Will arrange for CPAP set up at home Follow up in 8 weeks 

## 2011-11-03 ENCOUNTER — Telehealth: Payer: Self-pay | Admitting: Internal Medicine

## 2011-11-03 MED ORDER — ESTRADIOL 1 MG PO TABS: 1.0000 mg | ORAL_TABLET | Freq: Every day | ORAL | Status: DC

## 2011-11-03 NOTE — Telephone Encounter (Signed)
The pt called hoping to get a refill on her Estradiol 1mg .  She states the last time it was refilled, the hospital refilled it.    Thanks!

## 2011-11-06 ENCOUNTER — Other Ambulatory Visit: Payer: Self-pay | Admitting: Internal Medicine

## 2011-11-08 ENCOUNTER — Other Ambulatory Visit: Payer: Self-pay | Admitting: General Practice

## 2011-11-09 ENCOUNTER — Other Ambulatory Visit: Payer: Self-pay | Admitting: Internal Medicine

## 2011-11-22 ENCOUNTER — Telehealth: Payer: Self-pay | Admitting: Pulmonary Disease

## 2011-11-22 NOTE — Telephone Encounter (Signed)
CPAP 10/26/11 to 11/10/11>>Used on 12 of 15 nights with average 5 hrs 40 min.  Average AHI 3.8 with median CPAP 8 cm H2O and 95 th percentile CPAP 11 cm H2O.  Will have my nurse inform pt that CPAP report looks good.  No change to current set up.  Will discuss in more detail at next visit.

## 2011-11-23 NOTE — Telephone Encounter (Signed)
I spoke with patient about results and she verbalized understanding and had no questions 

## 2011-11-24 ENCOUNTER — Ambulatory Visit (INDEPENDENT_AMBULATORY_CARE_PROVIDER_SITE_OTHER): Payer: BC Managed Care – PPO | Admitting: Internal Medicine

## 2011-11-24 ENCOUNTER — Encounter: Payer: Self-pay | Admitting: Internal Medicine

## 2011-11-24 VITALS — BP 102/62 | HR 83 | Temp 98.5°F | Ht 66.0 in | Wt 203.0 lb

## 2011-11-24 DIAGNOSIS — F17200 Nicotine dependence, unspecified, uncomplicated: Secondary | ICD-10-CM

## 2011-11-24 DIAGNOSIS — Z23 Encounter for immunization: Secondary | ICD-10-CM

## 2011-11-24 DIAGNOSIS — F329 Major depressive disorder, single episode, unspecified: Secondary | ICD-10-CM

## 2011-11-24 DIAGNOSIS — R102 Pelvic and perineal pain: Secondary | ICD-10-CM

## 2011-11-24 DIAGNOSIS — F32A Depression, unspecified: Secondary | ICD-10-CM

## 2011-11-24 DIAGNOSIS — N949 Unspecified condition associated with female genital organs and menstrual cycle: Secondary | ICD-10-CM

## 2011-11-24 DIAGNOSIS — G4733 Obstructive sleep apnea (adult) (pediatric): Secondary | ICD-10-CM

## 2011-11-24 MED ORDER — VENLAFAXINE HCL ER 150 MG PO CP24: 150.0000 mg | ORAL_CAPSULE | Freq: Every day | ORAL | Status: DC

## 2011-11-24 MED ORDER — ALPRAZOLAM 0.25 MG PO TABS: 0.2500 mg | ORAL_TABLET | Freq: Three times a day (TID) | ORAL | Status: DC | PRN

## 2011-11-24 NOTE — Assessment & Plan Note (Signed)
Ongoing since 03/2010 with pelvic surgery and urologic injury periop status post uro eval (grapey) at time of injury - pr reports "nothing more to do per urology" then new R ovary cyst on 10/05/10 CT, not seen on  07/10/10 CT - s/p new gyn (neal) for eval same >>felt to be endometriosis contributing to current pain symptoms  s/p eval at [facility] gyn> s/p BSO 03/2011 due to pain symptoms  Started gabapentin for neuropathic component of pain in R pelvic region 09/2010 - improved but not resolved  Also working with Dr. Phillips/Suzane Hedgecock for help controlling same (known to pt though his care of her mom for RSD)  

## 2011-11-24 NOTE — Progress Notes (Signed)
  Subjective:    Patient ID: [name] White, female    DOB: [birth year], 46 y.o.   MRN: [id number]  HPI  Here for follow up -   Depression/anxiety - symptoms exac by 2012 medical complications post op -  no si/hi - decreased dose spring 2013 effexor- but increasing irritability fall 2013- uses xanax prn  OSA - started CPAP 10/2101 following 09/2011 sleep study - insomnia and daytime fatigue improved   Past Medical History  Diagnosis Date  . Depression   . Barrett's esophagus     egd 2007 + 2009 but none 2011 or 08/2010 egd  . Colon polyp     hyperplastic  . Grief reaction   . Chronic headache   . Tobacco abuse   . Migraine   . Restless leg   . IBS (irritable bowel syndrome)   . Hidradenitis suppurativa   . Ovarian cyst   . OSA (obstructive sleep apnea)     Review of Systems  Constitutional: Negative for fever or unexpected weight change.  Respiratory: Negative for cough and shortness of breath.   Cardiovascular: Negative for chest pain.      Objective:   Physical Exam  BP 102/62  Pulse 83  Temp 98.5 F (36.9 C) (Oral)  Ht 5\' 6"  (1.676 m)  Wt 203 lb (92.08 kg)  BMI 32.76 kg/m2  SpO2 97% Wt Readings from Last 3 Encounters:  11/24/11 203 lb (92.08 kg)  10/19/11 216 lb 9.6 oz (98.249 kg)  10/07/11 217 lb (98.431 kg)   Constitutional: She is overweight, but appears well-developed and well-nourished. No distress. Neck: thick, no thyroid nodules or enlargement Cardiovascular: Normal rate, regular rhythm and normal heart sounds.  No murmur heard. No BLE edema. Pulmonary/Chest: Effort normal and breath sounds normal. No respiratory distress. She has no wheezes.  Psychiatric: She has brighter mood and affect. Her behavior is normal. Judgment and thought content normal.   Lab Results  Component Value Date   WBC 5.7 08/23/2011   HGB 13.8 08/23/2011   HCT 41.1 08/23/2011   PLT 180.0 08/23/2011   CHOL 210* 04/28/2009   TRIG 100.0 04/28/2009   HDL 51.10 04/28/2009   LDLDIRECT 151.9  04/28/2009   ALT 25 08/23/2011   AST 24 08/23/2011   NA 139 08/23/2011   K 4.3 08/23/2011   CL 104 08/23/2011   CREATININE 0.7 08/23/2011   BUN 10 08/23/2011   CO2 26 08/23/2011   TSH 0.60 08/23/2011   INR 1.07 04/30/2010       Assessment & Plan:  See problem list. Medications and labs reviewed today.  5 minutes today spent counseling patient on unhealthy effects of continued tobacco abuse and encouragement of cessation including medical options available to help the patient quit smoking.

## 2011-11-24 NOTE — Assessment & Plan Note (Addendum)
Began CPAP 10/2011 after 09/2011 sleep study Follows with pulm for same, symptoms improved The current medical regimen is effective;  continue present plan and medications.

## 2011-11-24 NOTE — Patient Instructions (Addendum)
It was good to see you today. We have reviewed your interval history today Increase Effexor back to 150 mg daily and continue Xanax as needed  Other Medications reviewed and updated, no other changes at this time. Your prescription(s) have been submitted to your pharmacy. Please take as directed and contact our office if you believe you are having problem(s) with the medication(s). Please schedule followup in 6 months to review, call sooner if problems.  Smoking Cessation, Tips for Success YOU CAN QUIT SMOKING If you are ready to quit smoking, congratulations! You have chosen to help yourself be healthier. Cigarettes bring nicotine, tar, carbon monoxide, and other irritants into your body. Your lungs, heart, and blood vessels will be able to work better without these poisons. There are many different ways to quit smoking. Nicotine gum, nicotine patches, a nicotine inhaler, or nicotine nasal spray can help with physical craving. Hypnosis, support groups, and medicines help break the habit of smoking. Here are some tips to help you quit for good.  Throw away all cigarettes.   Clean and remove all ashtrays from your home, work, and car.   On a card, write down your reasons for quitting. Carry the card with you and read it when you get the urge to smoke.   Cleanse your body of nicotine. Drink enough water and fluids to keep your urine clear or pale yellow. Do this after quitting to flush the nicotine from your body.   Learn to predict your moods. Do not let a bad situation be your excuse to have a cigarette. Some situations in your life might tempt you into wanting a cigarette.   Never have "just one" cigarette. It leads to wanting another and another. Remind yourself of your decision to quit.   Change habits associated with smoking. If you smoked while driving or when feeling stressed, try other activities to replace smoking. Stand up when drinking your coffee. Brush your teeth after eating. Sit  in a different chair when you read the paper. Avoid alcohol while trying to quit, and try to drink fewer caffeinated beverages. Alcohol and caffeine may urge you to smoke.   Avoid foods and drinks that can trigger a desire to smoke, such as sugary or spicy foods and alcohol.   Ask people who smoke not to smoke around you.   Have something planned to do right after eating or having a cup of coffee. Take a walk or exercise to perk you up. This will help to keep you from overeating.   Try a relaxation exercise to calm you down and decrease your stress. Remember, you may be tense and nervous for the first 2 weeks after you quit, but this will pass.   Find new activities to keep your hands busy. Play with a pen, coin, or rubber band. Doodle or draw things on paper.   Brush your teeth right after eating. This will help cut down on the craving for the taste of tobacco after meals. You can try mouthwash, too.   Use oral substitutes, such as lemon drops, carrots, a cinnamon stick, or chewing gum, in place of cigarettes. Keep them handy so they are available when you have the urge to smoke.   When you have the urge to smoke, try deep breathing.   Designate your home as a nonsmoking area.   If you are a heavy smoker, ask your caregiver about a prescription for nicotine chewing gum. It can ease your withdrawal from nicotine.   Reward  yourself. Set aside the cigarette money you save and buy yourself something nice.   Look for support from others. Join a support group or smoking cessation program. Ask someone at home or at work to help you with your plan to quit smoking.   Always ask yourself, "Do I need this cigarette or is this just a reflex?" Tell yourself, "Today, I choose not to smoke," or "I do not want to smoke." You are reminding yourself of your decision to quit, even if you do smoke a cigarette.  HOW WILL I FEEL WHEN I QUIT SMOKING?  The benefits of not smoking start within days of quitting.    You may have symptoms of withdrawal because your body is used to nicotine (the addictive substance in cigarettes). You may crave cigarettes, be irritable, feel very hungry, cough often, get headaches, or have difficulty concentrating.   The withdrawal symptoms are only temporary. They are strongest when you first quit but will go away within 10 to 14 days.   When withdrawal symptoms occur, stay in control. Think about your reasons for quitting. Remind yourself that these are signs that your body is healing and getting used to being without cigarettes.   Remember that withdrawal symptoms are easier to treat than the major diseases that smoking can cause.   Even after the withdrawal is over, expect periodic urges to smoke. However, these cravings are generally short-lived and will go away whether you smoke or not. Do not smoke!   If you relapse and smoke again, do not lose hope. Most smokers quit 3 times before they are successful.   If you relapse, do not give up! Plan ahead and think about what you will do the next time you get the urge to smoke.  LIFE AS A NONSMOKER: MAKE IT FOR A MONTH, MAKE IT FOR LIFE Day 1: Hang this page where you will see it every day. Day 2: Get rid of all ashtrays, matches, and lighters. Day 3: Drink water. Breathe deeply between sips. Day 4: Avoid places with smoke-filled air, such as bars, clubs, or the smoking section of restaurants. Day 5: Keep track of how much money you save by not smoking. Day 6: Avoid boredom. Keep a good book with you or go to the movies. Day 7: Reward yourself! One week without smoking! Day 8: Make a dental appointment to get your teeth cleaned. Day 9: Decide how you will turn down a cigarette before it is offered to you. Day 10: Review your reasons for quitting. Day 11: Distract yourself. Stay active to keep your mind off smoking and to relieve tension. Take a walk, exercise, read a book, do a crossword puzzle, or try a new hobby. Day  12: Exercise. Get off the bus before your stop or use stairs instead of escalators. Day 13: Call on friends for support and encouragement. Day 14: Reward yourself! Two weeks without smoking! Day 15: Practice deep breathing exercises. Day 16: Bet a friend that you can stay a nonsmoker. Day 17: Ask to sit in nonsmoking sections of restaurants. Day 18: Hang up "No Smoking" signs. Day 19: Think of yourself as a nonsmoker. Day 20: Each morning, tell yourself you will not smoke. Day 21: Reward yourself! Three weeks without smoking! Day 22: Think of smoking in negative ways. Remember how it stains your teeth, gives you bad breath, and leaves you short of breath. Day 23: Eat a nutritious breakfast. Day 24:Do not relive your days as a smoker. Day  25: Hold a pencil in your hand when talking on the telephone. Day 26: Tell all your friends you do not smoke. Day 27: Think about how much better food tastes. Day 28: Remember, one cigarette is one too many. Day 29: Take up a hobby that will keep your hands busy. Day 30: Congratulations! One month without smoking! Give yourself a big reward. Your caregiver can direct you to community resources or hospitals for support, which may include:  Group support.   Education.   Hypnosis.   Subliminal therapy.  Document Released: 11/07/2003 Document Revised: 05/03/2011 Document Reviewed: 11/25/2008 [facility] Patient Information 2013 [place], [place].

## 2011-11-24 NOTE — Addendum Note (Signed)
Addended by: Carin Primrose on: 11/24/2011 04:46 PM   Modules accepted: Orders

## 2011-11-24 NOTE — Assessment & Plan Note (Signed)
symptoms exac by ongoing chronic RLQ pain - see next on effexor - tapered down 05/2011, but increase irritability so resume 150mg  now Denies SI/HI at this time continue prn low dose xanax  s/p gyn surgery 03/2011> reviewed same

## 2011-12-04 ENCOUNTER — Other Ambulatory Visit: Payer: Self-pay | Admitting: Internal Medicine

## 2011-12-04 NOTE — Telephone Encounter (Signed)
Done erx 

## 2011-12-07 ENCOUNTER — Ambulatory Visit (INDEPENDENT_AMBULATORY_CARE_PROVIDER_SITE_OTHER): Payer: BC Managed Care – PPO | Admitting: Pulmonary Disease

## 2011-12-07 ENCOUNTER — Encounter: Payer: Self-pay | Admitting: Pulmonary Disease

## 2011-12-07 ENCOUNTER — Ambulatory Visit: Payer: BC Managed Care – PPO | Admitting: Pulmonary Disease

## 2011-12-07 VITALS — BP 106/62 | HR 73 | Temp 98.0°F | Ht 66.0 in | Wt 200.4 lb

## 2011-12-07 DIAGNOSIS — G4733 Obstructive sleep apnea (adult) (pediatric): Secondary | ICD-10-CM

## 2011-12-07 NOTE — Patient Instructions (Signed)
Will arrange for overnight oxygen test and call with results Follow up in 3 months 

## 2011-12-07 NOTE — Assessment & Plan Note (Signed)
She reports compliance with CPAP and benefit from CPAP.  Her download shows good control of her sleep apnea.    She has continued daytime sleepiness.  Some of this could be related to her insufficient sleep.  Some of this could be related to her psychiatric and pain medications.  Some of this could be related to her restless legs although she reports this is controlled.  Will arrange for ONO with CPAP to further assess.   May need to consider in lab sleep study with MWT or MSLT.  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

## 2011-12-07 NOTE — Progress Notes (Signed)
Chief Complaint  Patient presents with  . Follow-up    wears cpap everynight. denies any problems with mask/machine. she is still falling asleep, feels tired during the day    History of Present Illness: [name] White is a 46 y.o. female with OSA.  She is compliant with CPAP and reports this helps.  She is still feeling sleepy during the day.  She goes to bed at 10 pm.  She takes her ropinerole before she goes to bed.  She wakes up at 3 am, and sometimes goes back to bed.  She reports using her CPAP whenever she is asleep.  Her download shows about 5.5 hrs use per night.  She has tried using xanax to help her stay asleep, but this doesn't work.  She takes most of her medicines at night otherwise.  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  She tries to get out of bed at 6 am to be at work between 8 am and 5 pm.   Tests: PSG 10/07/11>>AHI 17.8, SpO2 low 86%, PLMI 10.2.  Moderate OSA. CPAP 10/26/11 to 11/10/11>>Used on 12 of 15 nights with average 5 hrs 40 min. Average AHI 3.8 with median CPAP 8 cm H2O and 95 th percentile CPAP 11 cm H2O.  Past Medical History  Diagnosis Date  . Depression   . Barrett's esophagus     egd 2007 + 2009 but none 2011 or 08/2010 egd  . Colon polyp     hyperplastic  . Grief reaction   . Chronic headache   . Tobacco abuse   . Migraine   . Restless leg   . IBS (irritable bowel syndrome)   . Hidradenitis suppurativa   . Ovarian cyst   . OSA (obstructive sleep apnea)     Past Surgical History  Procedure Date  . Wrist surgery     right  . Cesarean section     x 2  . Cholecystectomy   . Tubal ligation   . Plantar fascitis     right foot  . Abdominal hysterectomy     secondary to endometriosis  . Laparoscopy 04-22-10    lysis of adhesions  . Elbow surgery     left  . Salpingectomy 04-22-10    right  . Laparoscopy 04-30-10     with mini laparotomy and cystotomy repair    Outpatient Encounter  Prescriptions as of 12/07/2011  Medication Sig Dispense Refill  . ALPRAZolam (XANAX) 0.25 MG tablet Take 1 tablet (0.25 mg total) by mouth 3 (three) times daily as needed for sleep or anxiety.  30 tablet  1  . estradiol (ESTRACE) 1 MG tablet Take 1 tablet (1 mg total) by mouth daily.  30 tablet  2  . GRALISE 600 MG TABS 3 tablets in the afternoon      . HYDROmorphone (DILAUDID) 4 MG tablet Take 4 mg by mouth every 4 (four) hours as needed. Every 4-6 hrs PRN Pain.      Marland Kitchen omeprazole (PRILOSEC) 40 MG capsule TAKE 1 CAPSULE (40 MG TOTAL) BY MOUTH 2 (TWO) TIMES DAILY.  60 capsule  3  . ondansetron (ZOFRAN) 8 MG tablet Take 1 tablet (8 mg total) by mouth every 8 (eight) hours as needed for nausea.  30 tablet  2  . rOPINIRole (REQUIP) 2 MG tablet TAKE 1 TABLET (2 MG TOTAL) BY MOUTH AT BEDTIME.  30 tablet  2  . venlafaxine XR (EFFEXOR-XR) 150 MG  24 hr capsule Take 1 capsule (150 mg total) by mouth daily.  90 capsule  3  . DISCONTD: acetaminophen (TYLENOL) 500 MG tablet Take 500 mg by mouth every 6 (six) hours as needed.      Marland Kitchen DISCONTD: gabapentin (NEURONTIN) 300 MG capsule TAKE 2 CAPSULES (600 MG TOTAL) BY MOUTH 3 (THREE) TIMES DAILY.  180 capsule  6    Allergies  Allergen Reactions  . Morphine And Related   . Penicillins     REACTION: hives    Physical Exam:  Filed Vitals:   12/07/11 1441 12/07/11 1443  BP:  106/62  Pulse:  73  Temp: 98 F (36.7 C)   TempSrc: Oral   Height: 5\' 6"  (1.676 m)   Weight: 200 lb 6.4 oz (90.901 kg)   SpO2:  99%    Current Encounter SPO2  12/07/11 1443 99%  11/24/11 1606 97%  10/19/11 1330 97%    Body mass index is 32.35 kg/(m^2). Wt Readings from Last 2 Encounters:  12/07/11 200 lb 6.4 oz (90.901 kg)  11/24/11 203 lb (92.08 kg)    General - No distress  ENT - no sinus tenderness, no oral exudate, no LAN, no thyromegaly  Cardiac - s1s2 regular, no murmur, no edema  Chest - normal respiratory excursion, good air entry, no wheeze/rales/dullness    Back - no focal tenderness  Abd - soft, non-tender, + bowel sounds  Ext - normal motor strength  Neuro - Cranial nerves are normal. PERLA. EOM's intact.  Skin - no rashes Psych - normal mood, and behavior.  Assessment/Plan:  Coralyn Helling, MD [place]/Sleep Pager:  [phone] 12/07/2011, 3:06 PM

## 2011-12-23 ENCOUNTER — Telehealth: Payer: Self-pay | Admitting: Pulmonary Disease

## 2011-12-23 DIAGNOSIS — G4733 Obstructive sleep apnea (adult) (pediatric): Secondary | ICD-10-CM

## 2011-12-23 DIAGNOSIS — G4710 Hypersomnia, unspecified: Secondary | ICD-10-CM

## 2011-12-23 NOTE — Telephone Encounter (Signed)
ONO with CPAP and RA 12/15/11>>Test time 2 hrs 12 min.  Mean SpO2 95%, low SpO2 83%.  Spent 16 sec with SpO2 < 88%.  Results d/w pt.  No explanation for daytime sleepiness.  No significant oxygen desaturation, and recent CPAP report shows good control of sleep apnea and good compliance.  Will arrange for MSLT with CPAP to further assess daytime hypersomnolence.

## 2011-12-23 NOTE — Telephone Encounter (Signed)
Patient requesting results of ONO and based on those results would like to know if she will be placed on a medication to help her stay awake during the day.  Dr. Craige Cotta, are these results ready for patient? Please advise, thank you

## 2011-12-30 ENCOUNTER — Telehealth: Payer: Self-pay | Admitting: Pulmonary Disease

## 2011-12-30 NOTE — Telephone Encounter (Signed)
Noted, and agree.

## 2011-12-30 NOTE — Telephone Encounter (Signed)
I spoke with pt and she stated she is falling asleep while at work. She just started her new job and does not want to lose her job. Pt stated she is not wanting to wake up in the morning. 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   I spoke with VS and he stated he does not want to start pt on any medication yet bc he needs to see how bad her MSLT is. If he did start her on medication then the test would be inaccurate. See if she can hold off until her study is done.  I called and spoke with pt and advised her of this. She stated she will do her best. Advised pt once test is reviewed will call her. She stated if possible she would like her test read ASAP once done. Will forward to Dr. Craige Cotta so he is aware regarding reading of results ASAP once MSLT is done

## 2012-01-05 ENCOUNTER — Ambulatory Visit (HOSPITAL_BASED_OUTPATIENT_CLINIC_OR_DEPARTMENT_OTHER): Payer: BC Managed Care – PPO | Attending: Pulmonary Disease | Admitting: Radiology

## 2012-01-05 VITALS — Ht 66.0 in | Wt 195.0 lb

## 2012-01-05 DIAGNOSIS — Z79899 Other long term (current) drug therapy: Secondary | ICD-10-CM | POA: Insufficient documentation

## 2012-01-05 DIAGNOSIS — K589 Irritable bowel syndrome without diarrhea: Secondary | ICD-10-CM | POA: Insufficient documentation

## 2012-01-05 DIAGNOSIS — G4733 Obstructive sleep apnea (adult) (pediatric): Secondary | ICD-10-CM | POA: Insufficient documentation

## 2012-01-05 DIAGNOSIS — G4710 Hypersomnia, unspecified: Secondary | ICD-10-CM

## 2012-01-05 DIAGNOSIS — F3289 Other specified depressive episodes: Secondary | ICD-10-CM | POA: Insufficient documentation

## 2012-01-05 DIAGNOSIS — G47419 Narcolepsy without cataplexy: Secondary | ICD-10-CM

## 2012-01-05 DIAGNOSIS — F329 Major depressive disorder, single episode, unspecified: Secondary | ICD-10-CM | POA: Insufficient documentation

## 2012-01-05 DIAGNOSIS — G2581 Restless legs syndrome: Secondary | ICD-10-CM | POA: Insufficient documentation

## 2012-01-12 ENCOUNTER — Encounter: Payer: Self-pay | Admitting: Pulmonary Disease

## 2012-01-17 ENCOUNTER — Telehealth: Payer: Self-pay | Admitting: Pulmonary Disease

## 2012-01-17 NOTE — Telephone Encounter (Signed)
I spoke with pt and she is requesting her sleep study results from 11.13.13. i advised pt VS is out the office until next week and will get the message to him for when he returns. Nothing further was needed

## 2012-01-26 DIAGNOSIS — G4733 Obstructive sleep apnea (adult) (pediatric): Secondary | ICD-10-CM

## 2012-01-26 DIAGNOSIS — G4710 Hypersomnia, unspecified: Secondary | ICD-10-CM

## 2012-01-26 NOTE — Telephone Encounter (Signed)
MSLT with CPAP 01/05/12>>Mean sleep latency 12 min.  Sleep in 5/5 naps, no SOREM's.  Will have my nurse schedule ROV to review results.

## 2012-01-27 NOTE — Telephone Encounter (Signed)
I spoke with pt and is scheduled to come in 12.6.13 at 1:30

## 2012-01-28 ENCOUNTER — Ambulatory Visit (INDEPENDENT_AMBULATORY_CARE_PROVIDER_SITE_OTHER): Payer: BC Managed Care – PPO | Admitting: Pulmonary Disease

## 2012-01-28 ENCOUNTER — Encounter: Payer: Self-pay | Admitting: Pulmonary Disease

## 2012-01-28 VITALS — BP 110/78 | HR 77 | Temp 99.2°F | Ht 67.0 in | Wt 200.8 lb

## 2012-01-28 DIAGNOSIS — G4733 Obstructive sleep apnea (adult) (pediatric): Secondary | ICD-10-CM

## 2012-01-28 DIAGNOSIS — G4710 Hypersomnia, unspecified: Secondary | ICD-10-CM

## 2012-01-28 DIAGNOSIS — G2581 Restless legs syndrome: Secondary | ICD-10-CM

## 2012-01-28 MED ORDER — ZOLPIDEM TARTRATE 5 MG PO TABS: 5.0000 mg | ORAL_TABLET | Freq: Every evening | ORAL | Status: DC | PRN

## 2012-01-28 NOTE — Patient Instructions (Addendum)
Zolpidem (ambien) 5 mg nightly before bedtime Follow up in 6 weeks

## 2012-01-28 NOTE — Progress Notes (Signed)
Chief Complaint  Patient presents with  . Follow-up    Discuss sleep results-- pt reports still sleeping throughout day --denies any oter concerns    History of Present Illness: [name] White is a 46 y.o. female with OSA and persistent daytime hypersomnolence.  She is here to review her MSLT.  She uses her CPAP for the entire time she is asleep.  She only sleeps for about 5 hours at night.  She feels tired during the day, and has trouble staying awake.  She will fall asleep at times during the day unintentionally.  As a result she will not be using her CPAP when she falls asleep during the day.  She does not think she is having trouble with her restless leg symptoms.   Tests: PSG 10/07/11>>AHI 17.8, SpO2 low 86%, PLMI 10.2.  Moderate OSA. CPAP 10/26/11 to 11/10/11>>Used on 12 of 15 nights with average 5 hrs 40 min. Average AHI 3.8 with median CPAP 8 cm H2O and 95 th percentile CPAP 11 cm H2O. ONO with CPAP and RA 12/15/11>>Test time 2 hrs 12 min. Mean SpO2 95%, low SpO2 83%. Spent 16 sec with SpO2 < 88%. MSLT with CPAP 01/05/12>>Mean sleep latency 12 min. Sleep in 5/5 naps, no SOREM's.  Past Medical History  Diagnosis Date  . Depression   . Barrett's esophagus     egd 2007 + 2009 but none 2011 or 08/2010 egd  . Colon polyp     hyperplastic  . Grief reaction   . Chronic headache   . Tobacco abuse   . Migraine   . Restless leg   . IBS (irritable bowel syndrome)   . Hidradenitis suppurativa   . Ovarian cyst   . OSA (obstructive sleep apnea)     Past Surgical History  Procedure Date  . Wrist surgery     right  . Cesarean section     x 2  . Cholecystectomy   . Tubal ligation   . Plantar fascitis     right foot  . Abdominal hysterectomy     secondary to endometriosis  . Laparoscopy 04-22-10    lysis of adhesions  . Elbow surgery     left  . Salpingectomy 04-22-10    right  . Laparoscopy 04-30-10     with mini laparotomy and cystotomy repair    Outpatient Encounter  Prescriptions as of 01/28/2012  Medication Sig Dispense Refill  . ALPRAZolam (XANAX) 0.25 MG tablet Take 1 tablet (0.25 mg total) by mouth 3 (three) times daily as needed for sleep or anxiety.  30 tablet  1  . estradiol (ESTRACE) 1 MG tablet Take 1 tablet (1 mg total) by mouth daily.  30 tablet  2  . GRALISE 600 MG TABS 3 tablets in the afternoon      . HYDROmorphone (DILAUDID) 4 MG tablet Take 4 mg by mouth every 4 (four) hours as needed. Every 4-6 hrs PRN Pain.      Marland Kitchen omeprazole (PRILOSEC) 40 MG capsule TAKE 1 CAPSULE (40 MG TOTAL) BY MOUTH 2 (TWO) TIMES DAILY.  60 capsule  3  . ondansetron (ZOFRAN) 8 MG tablet Take 1 tablet (8 mg total) by mouth every 8 (eight) hours as needed for nausea.  30 tablet  2  . rOPINIRole (REQUIP) 2 MG tablet TAKE 1 TABLET (2 MG TOTAL) BY MOUTH AT BEDTIME.  30 tablet  2  . venlafaxine XR (EFFEXOR-XR) 150 MG 24 hr capsule Take 1 capsule (150 mg total) by mouth  daily.  90 capsule  3    Allergies  Allergen Reactions  . Morphine And Related   . Penicillins     REACTION: hives    Physical Exam:  Filed Vitals:   01/28/12 1330  BP: 110/78  Pulse: 77  Temp: 99.2 F (37.3 C)  TempSrc: Oral  Height: 5\' 7"  (1.702 m)  Weight: 200 lb 12.8 oz (91.082 kg)  SpO2: 97%    Current Encounter SPO2  01/28/12 1330 97%  12/07/11 1443 99%  11/24/11 1606 97%    Body mass index is 31.45 kg/(m^2).   Wt Readings from Last 2 Encounters:  01/28/12 200 lb 12.8 oz (91.082 kg)  01/05/12 195 lb (88.451 kg)    General - No distress  ENT - no sinus tenderness, no oral exudate, no LAN Cardiac - s1s2 regular, no murmur Chest - normal respiratory excursion, good air entry, no wheeze/rales/dullness  Back - no focal tenderness  Abd - soft, non-tender Ext - no edema Neuro - normal strength  Skin - no rashes Psych - normal mood, and behavior  Assessment/Plan:  Coralyn Helling, MD [place] [facility]/Sleep Pager:  [phone] 01/28/2012, 1:41 PM

## 2012-01-30 DIAGNOSIS — G4710 Hypersomnia, unspecified: Secondary | ICD-10-CM | POA: Insufficient documentation

## 2012-01-30 NOTE — Assessment & Plan Note (Addendum)
Her recent MSLT was not suggestive of narcolepsy, and she does not have symptoms to suggest narcolepsy.  I think much of her daytime sleepiness is related to her sleep pattern, and not allowing enough time for sleep at night.  I have asked her to keep a strict sleep/wake schedule allowing 7 hours of sleep during this time.  She is not to sleep outside this time period.  She is to use her CPAP for the entire time she is asleep.  I have given her script for Remus Loffler which she can use during this transition to help with sleep consolidation.  I am reluctant to give stimulant medication at this time.  I also explained that some of her pain medicines, and anti-depressant medicines may be affecting her daytime alertness.

## 2012-01-30 NOTE — Assessment & Plan Note (Signed)
Recent CPAP download and ONO with CPAP show good control of sleep apnea with current set up.

## 2012-01-30 NOTE — Assessment & Plan Note (Signed)
She reports good control of her restless leg symptoms with ropinirole.

## 2012-02-02 NOTE — Procedures (Signed)
NAME:  Spillane, Lachrisha                ACCOUNT NO.:  [id number]  MEDICAL RECORD NO.:  [id number]          PATIENT TYPE:  OUT  LOCATION:  SLEEP CENTER                 FACILITY:  [facility]  PHYSICIAN:  Coralyn Helling, MD        DATE OF BIRTH:  07/29/1965  DATE OF STUDY:  01/05/2012                           NOCTURNAL POLYSOMNOGRAM  REFERRING PHYSICIAN:  Coralyn Helling, MD  INDICATION FOR STUDY:  Ms. Devol is a 46 year old female who has a history of obstructive sleep apnea, outpatient CPAP download and overnight oximetry showed control of her sleep apnea with good compliance with CPAP therapy.  However, she continued to have difficulties with daytime fatigue and hypersomnolence.  She is referred to the sleep lab for further evaluation of her daytime hypersomnolence. Of note, that the study was conducted with the patient using a CPAP.  EPWORTH SLEEPINESS SCORE:  17.  Height is 5 feet 6 inches, weight is 195 pounds, BMI is 31.  Neck size is 15 inches.  MEDICATIONS:  Gralise, Effexor, ropinirole, Dilaudid, Xanax, omeprazole, and estradiol.  SLEEP ARCHITECTURE:  During nap session 1, lights off time was 8:07 a.m. Lights on time was 8:32 a.m.  Sleep latency was 10.5 minutes.  REM latency was not applicable.  During nap session 2, lights off time was 10:05 a.m.  Lights on time was 10:31 a.m.  Sleep latency was 11 minutes.  REM latency was not applicable.  During nap session 3, lights off time was 12:02 p.m.  Lights on time was 12:26 p.m.  Sleep latency was 9.5 minutes.  REM latency was not applicable.  During nap session 3, lights off time was 14:03, lights on time was 14:31.  Sleep latency was 15.5 minutes.  REM latency was not applicable. During nap session 4, lights off time was 16:04.  Lights on was 16:34. Sleep latency is 15.5 minutes.  REM latency was not applicable.  Mean sleep latency was 12 minutes.  Number of naps with sleep was 5/5. Number of naps with REM sleep was  0/5.  IMPRESSIONS-RECOMMENDATIONS:  This study shows evidence for daytime hypersomnolence.  The lack of sleep onset REM episodes speaks against narcolepsy.  Clinical correlation would be necessary to determine the significance of these findings.     Coralyn Helling, MD Diplomat, American Board of Sleep Medicine    VS/MEDQ  D:  02/01/2012 13:16:18  T:  02/02/2012 02:00:32  Job:  [id number]

## 2012-02-04 ENCOUNTER — Other Ambulatory Visit: Payer: Self-pay | Admitting: Internal Medicine

## 2012-03-07 ENCOUNTER — Ambulatory Visit: Payer: BC Managed Care – PPO | Admitting: Pulmonary Disease

## 2012-04-27 ENCOUNTER — Other Ambulatory Visit: Payer: Self-pay | Admitting: Internal Medicine

## 2012-04-27 NOTE — Telephone Encounter (Signed)
Faxed script back to [facility].../lmb 

## 2012-05-22 ENCOUNTER — Encounter: Payer: Self-pay | Admitting: Internal Medicine

## 2012-05-22 ENCOUNTER — Ambulatory Visit (INDEPENDENT_AMBULATORY_CARE_PROVIDER_SITE_OTHER): Payer: BC Managed Care – PPO | Admitting: Internal Medicine

## 2012-05-22 VITALS — BP 110/78 | HR 81 | Temp 99.0°F | Wt 199.0 lb

## 2012-05-22 DIAGNOSIS — J9801 Acute bronchospasm: Secondary | ICD-10-CM

## 2012-05-22 DIAGNOSIS — R059 Cough, unspecified: Secondary | ICD-10-CM

## 2012-05-22 DIAGNOSIS — R05 Cough: Secondary | ICD-10-CM

## 2012-05-22 DIAGNOSIS — F17200 Nicotine dependence, unspecified, uncomplicated: Secondary | ICD-10-CM

## 2012-05-22 MED ORDER — PREDNISONE (PAK) 10 MG PO TABS: 10.0000 mg | ORAL_TABLET | ORAL | Status: DC

## 2012-05-22 MED ORDER — AZITHROMYCIN 250 MG PO TABS: ORAL_TABLET | ORAL | Status: DC

## 2012-05-22 MED ORDER — HYDROCODONE-HOMATROPINE 5-1.5 MG/5ML PO SYRP: 5.0000 mL | ORAL_SOLUTION | Freq: Four times a day (QID) | ORAL | Status: DC | PRN

## 2012-05-22 NOTE — Patient Instructions (Signed)
It was good to see you today. Zpak antibiotics, pred taper x 6 days and prescription cough syrup - Your prescription(s) have been submitted to your pharmacy. Please take as directed and contact our office if you believe you are having problem(s) with the medication(s). Alternate between ibuprofen and tylenol for aches, pain and fever symptoms as needed Hydrate, rest and call if worse or unimproved

## 2012-05-22 NOTE — Progress Notes (Signed)
  Subjective:    HPI  complains of cold and cough symptoms  Onset >2 week ago, wax/wane symptoms  associated with rhinorrhea, sneezing, sore throat, mild headache and low grade fever Also myalgias, sinus pressure and mild-mod chest congestion No relief with OTC meds Precipitated by sick contacts and weather changes  Past Medical History  Diagnosis Date  . Depression   . Barrett's esophagus     egd 2007 + 2009 but none 2011 or 08/2010 egd  . Colon polyp     hyperplastic  . Grief reaction   . Chronic headache   . Tobacco abuse   . Migraine   . Restless leg   . IBS (irritable bowel syndrome)   . Hidradenitis suppurativa   . Ovarian cyst   . OSA (obstructive sleep apnea)     Review of Systems Constitutional: No fever or night sweats, no unexpected weight change Pulmonary: No pleurisy or hemoptysis Cardiovascular: No chest pain or palpitations     Objective:   Physical Exam BP 110/78  Pulse 81  Temp(Src) 99 F (37.2 C) (Oral)  Wt 199 lb (90.266 kg)  BMI 31.16 kg/m2  SpO2 97% GEN: mildly ill appearing and audible head/chest congestion - spouse at side HENT: NCAT, mild sinus tenderness bilaterally, nares with clear discharge, oropharynx mild erythema, no exudate Eyes: Vision grossly intact, no conjunctivitis Lungs: Clear to auscultation without rhonchi -mild end exp wheeze, but no increased work of breathing Cardiovascular: Regular rate and rhythm, no bilateral edema      Assessment & Plan:  Viral URI with cough and acute bronchospasm Cough, postnasal drip related to above Tobacco abuse   Empiric antibiotics prescribed due to symptom duration greater than 7 days Prescription cough suppression - new prescriptions done Symptomatic care with Tylenol or Advil, hydration and rest -  salt gargle advised as needed 5 minutes today spent counseling patient on unhealthy effects of continued tobacco abuse and encouragement of cessation including medical options available  to help the patient quit smoking.

## 2012-05-22 NOTE — Assessment & Plan Note (Signed)
5 minutes today spent counseling patient on unhealthy effects of continued tobacco abuse and encouragement of cessation including medical options available to help the patient quit smoking. 

## 2012-05-31 ENCOUNTER — Ambulatory Visit (INDEPENDENT_AMBULATORY_CARE_PROVIDER_SITE_OTHER): Payer: BC Managed Care – PPO | Admitting: Internal Medicine

## 2012-05-31 ENCOUNTER — Encounter: Payer: Self-pay | Admitting: Internal Medicine

## 2012-05-31 VITALS — BP 110/70 | HR 89 | Temp 98.3°F | Wt 204.4 lb

## 2012-05-31 DIAGNOSIS — F32A Depression, unspecified: Secondary | ICD-10-CM

## 2012-05-31 DIAGNOSIS — R102 Pelvic and perineal pain unspecified side: Secondary | ICD-10-CM

## 2012-05-31 DIAGNOSIS — N949 Unspecified condition associated with female genital organs and menstrual cycle: Secondary | ICD-10-CM

## 2012-05-31 DIAGNOSIS — E669 Obesity, unspecified: Secondary | ICD-10-CM

## 2012-05-31 DIAGNOSIS — F329 Major depressive disorder, single episode, unspecified: Secondary | ICD-10-CM

## 2012-05-31 MED ORDER — ARIPIPRAZOLE 10 MG PO TABS: 10.0000 mg | ORAL_TABLET | Freq: Every day | ORAL | Status: DC

## 2012-05-31 NOTE — Assessment & Plan Note (Signed)
symptoms exac by ongoing chronic RLQ pain - see above on effexor - tapered down 05/2011, but resumed 150mg  11/2011 due to increasing irritability Persisting depression and poor sleep - insomnia meds reviewed Add Abilify to ongoing meds - erx done Denies SI/HI at this time continue prn low dose xanax  s/p gyn surgery 03/2011> reviewed same - refer for new opinion at [facility] now

## 2012-05-31 NOTE — Progress Notes (Signed)
  Subjective:    Patient ID: [name] White, female    DOB: [birth year], 47 y.o.   MRN: [id number]  HPI  Here for follow up -    Past Medical History  Diagnosis Date  . Depression   . Barrett's esophagus     egd 2007 + 2009 but none 2011 or 08/2010 egd  . Colon polyp     hyperplastic  . Grief reaction   . Chronic headache   . Tobacco abuse   . Migraine   . Restless leg   . IBS (irritable bowel syndrome)   . Hidradenitis suppurativa   . Ovarian cyst   . OSA (obstructive sleep apnea)     Review of Systems  Constitutional: Negative for fever or unexpected weight change.  Respiratory: Negative for cough and shortness of breath.   Cardiovascular: Negative for chest pain.      Objective:   Physical Exam  BP 110/70  Pulse 89  Temp(Src) 98.3 F (36.8 C) (Oral)  Wt 204 lb 6.4 oz (92.715 kg)  BMI 32.01 kg/m2  SpO2 97% Wt Readings from Last 3 Encounters:  05/31/12 204 lb 6.4 oz (92.715 kg)  05/22/12 199 lb (90.266 kg)  01/28/12 200 lb 12.8 oz (91.082 kg)   Constitutional: She is overweight, but appears well-developed and well-nourished. No distress. Neck: thick, no thyroid nodules or enlargement Cardiovascular: Normal rate, regular rhythm and normal heart sounds.  No murmur heard. No BLE edema. Pulmonary/Chest: Effort normal and breath sounds normal. No respiratory distress. She has no wheezes.  Psychiatric: She has brighter mood and affect. Her behavior is normal. Judgment and thought content normal.   Lab Results  Component Value Date   WBC 5.7 08/23/2011   HGB 13.8 08/23/2011   HCT 41.1 08/23/2011   PLT 180.0 08/23/2011   CHOL 210* 04/28/2009   TRIG 100.0 04/28/2009   HDL 51.10 04/28/2009   LDLDIRECT 151.9 04/28/2009   ALT 25 08/23/2011   AST 24 08/23/2011   NA 139 08/23/2011   K 4.3 08/23/2011   CL 104 08/23/2011   CREATININE 0.7 08/23/2011   BUN 10 08/23/2011   CO2 26 08/23/2011   TSH 0.60 08/23/2011   INR 1.07 04/30/2010       Assessment & Plan:  See problem list. Medications and  labs reviewed today.

## 2012-05-31 NOTE — Assessment & Plan Note (Signed)
Ongoing since 03/2010 with pelvic surgery and urologic injury periop status post uro eval (grapey) at time of injury - pr reports "nothing more to do per urology" then new R ovary cyst on 10/05/10 CT, not seen on  07/10/10 CT - s/p new gyn (neal) for eval same >>felt to be endometriosis contributing to current pain symptoms  s/p eval at [facility] gyn> s/p BSO 03/2011 due to pain symptoms  Started gabapentin for neuropathic component of pain in R pelvic region 09/2010 - improved but not resolved  On dilaudid managed by Dr. Edwena Felty Hedgecock for same (known to pt though his care of her mom for RSD) Refer to [facility] for "new opinion" at pt request (as suggested by [place])

## 2012-05-31 NOTE — Assessment & Plan Note (Signed)
Wt Readings from Last 3 Encounters:  05/31/12 204 lb 6.4 oz (92.715 kg)  05/22/12 199 lb (90.266 kg)  01/28/12 200 lb 12.8 oz (91.082 kg)   The patient is asked to make an attempt to improve diet and exercise patterns to aid in medical management of this problem.

## 2012-05-31 NOTE — Patient Instructions (Signed)
It was good to see you today. We have reviewed your prior records including labs and tests today we'll make referral to [facility] for new gynecology opinion . Our office will contact you regarding appointment(s) once made. Start Abilify in addition to current meds for depression symptoms - Your prescription(s) have been submitted to your pharmacy. Please take as directed and contact our office if you believe you are having problem(s) with the medication(s). Other medications reviewed and updated, no changes at this time. Please schedule followup in 3-4 months, call sooner if problems.

## 2012-06-06 ENCOUNTER — Encounter: Payer: Self-pay | Admitting: Internal Medicine

## 2012-06-17 IMAGING — CT CT ABD-PELV W/ CM
2 of 5 series · 16 of 46 positions shown, 18 images · IV contrast (omnipaque)
Comparison: CT of the abdomen and pelvis performed 12/27/2005

CLINICAL DATA: Abdominal pain and leukocytosis; status post
laparoscopy today.  History of uterine fibroids.

CT ABDOMEN AND PELVIS WITH CONTRAST
TECHNIQUE: Multidetector CT imaging of the abdomen and pelvis was
performed following the standard protocol during bolus
administration of intravenous contrast.
Contrast: 100 mL of Omnipaque 300 IV contrast

[Series 2: routine abdomen/pelvis with · axial · 0.72mm/px · z∈[-496,-66]mm · 13 of 96 slices shown, 15 images]
[im 5/96  soft-tissue]
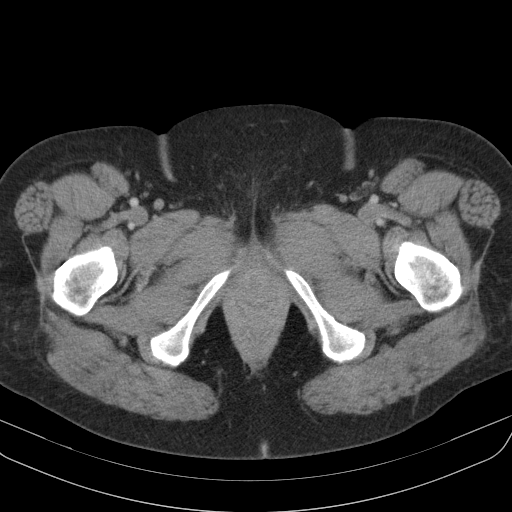
[im 5/96  bone]
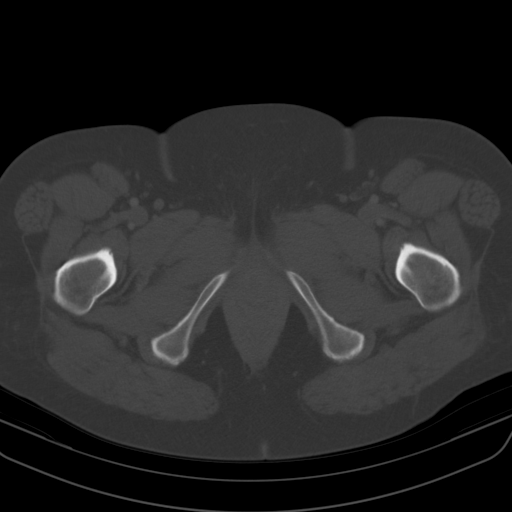
[im 15/96  soft-tissue]
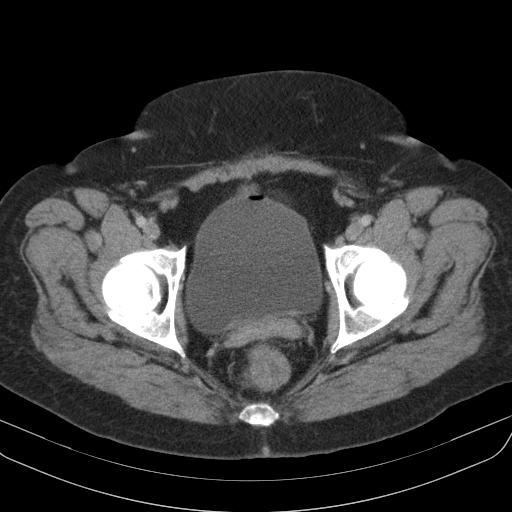
[im 20/96  soft-tissue]
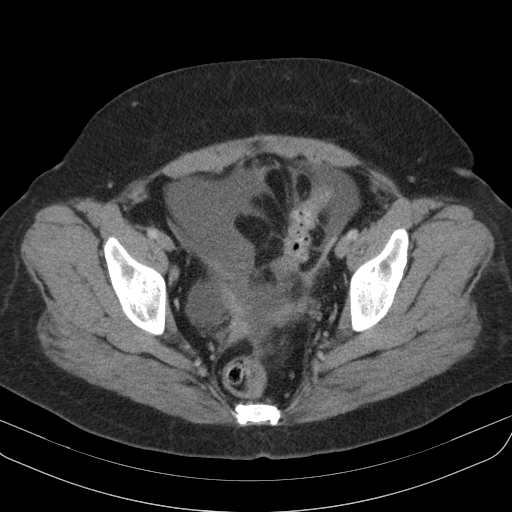
[im 29/96  soft-tissue]
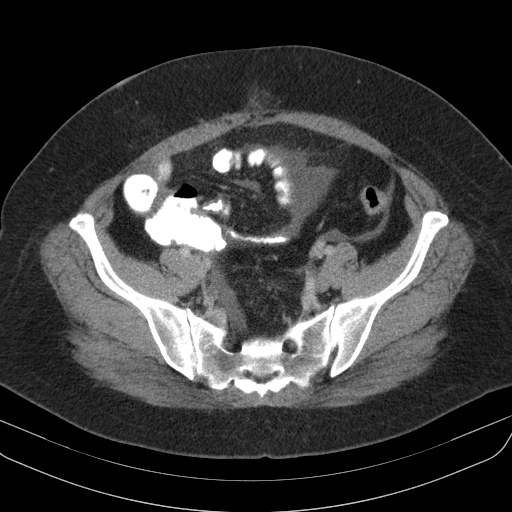
[im 34/96  soft-tissue]
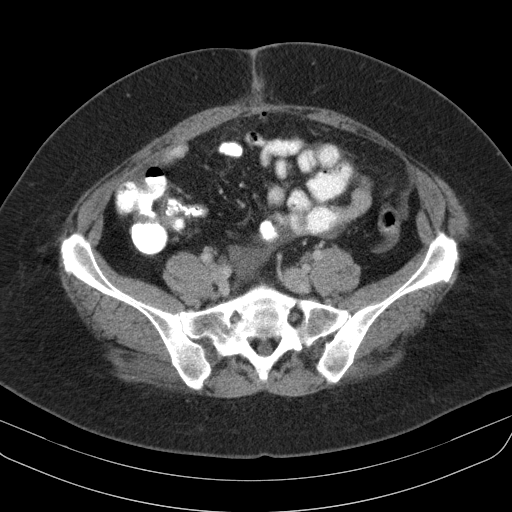
[im 43/96  soft-tissue]
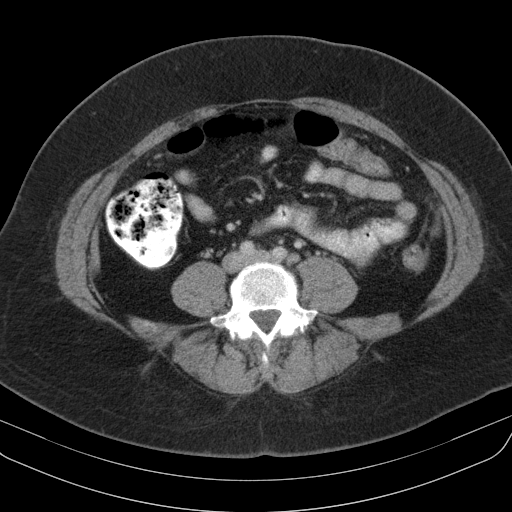
[im 48/96  soft-tissue]
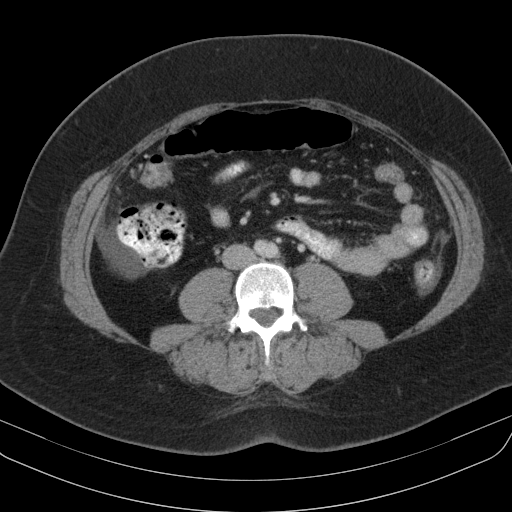
[im 53/96  soft-tissue]
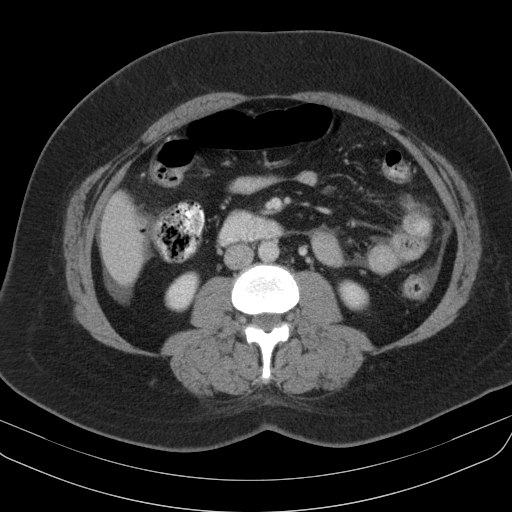
[im 62/96  soft-tissue]
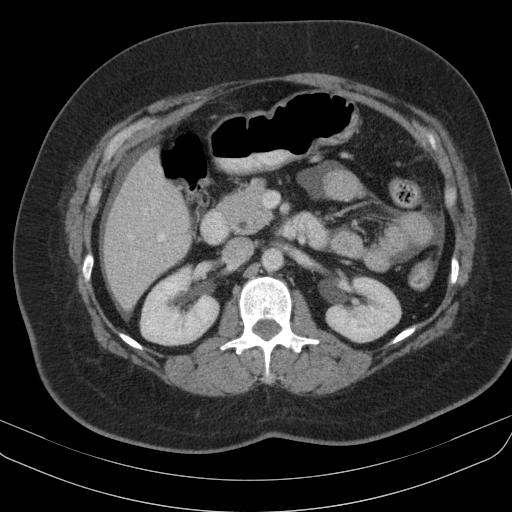
[im 62/96  bone]
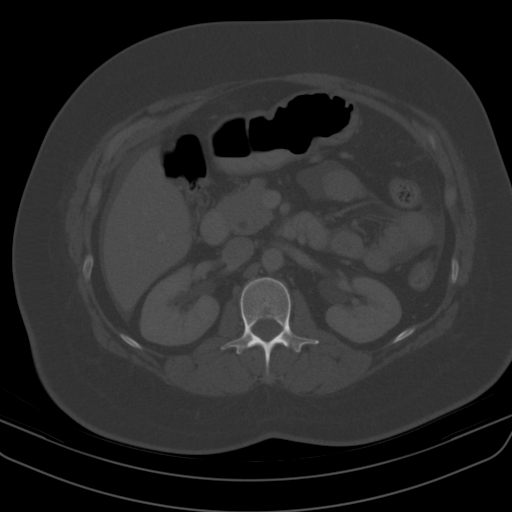
[im 67/96  soft-tissue]
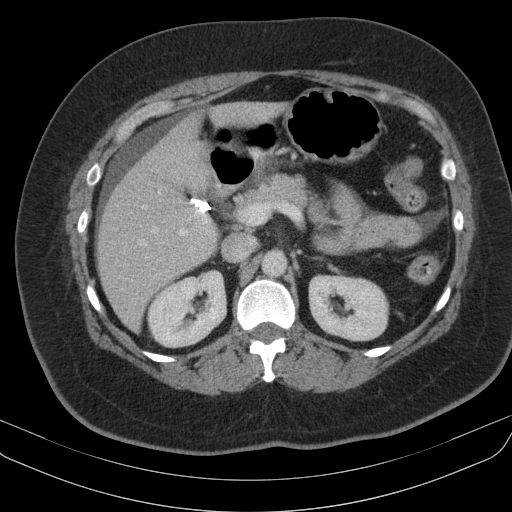
[im 77/96  soft-tissue]
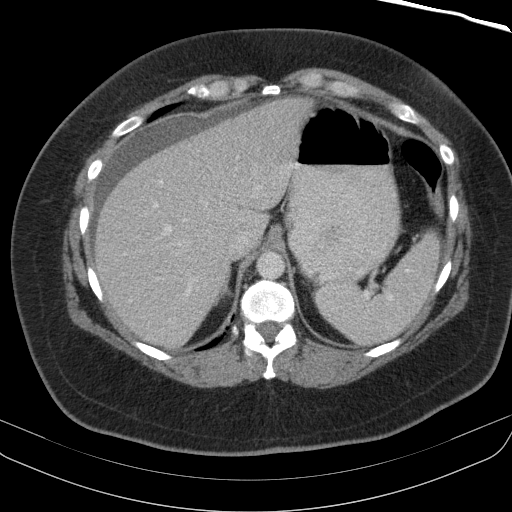
[im 81/96  soft-tissue]
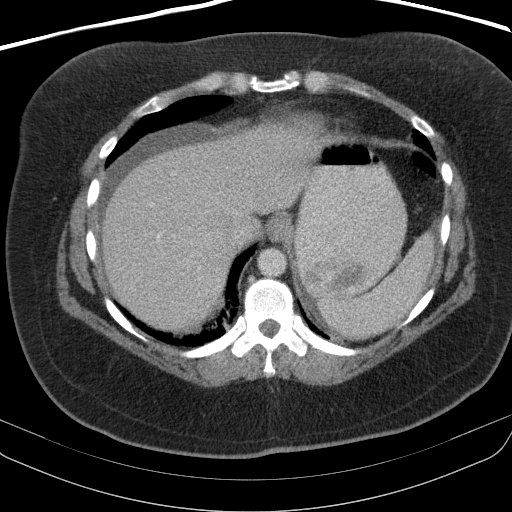
[im 91/96  soft-tissue]
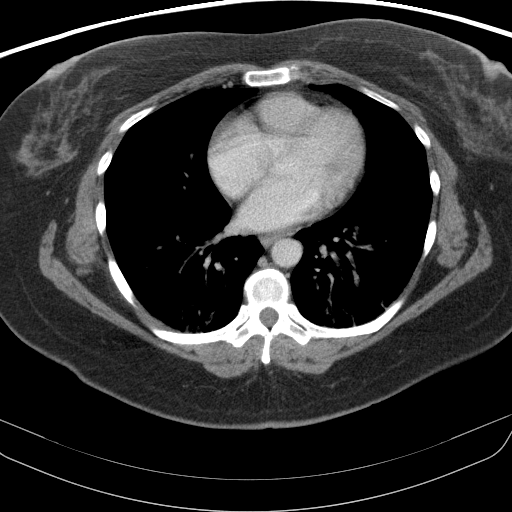

[Series 602: <mpr thick range> · coronal · 0.94mm/px · 3 of 98 slices shown]
[im 33/98  soft-tissue]
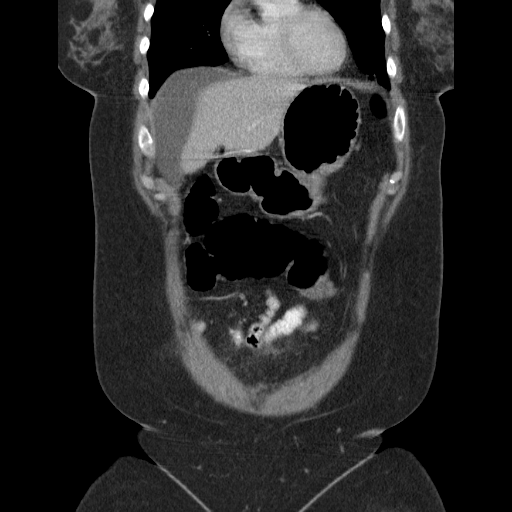
[im 44/98  soft-tissue]
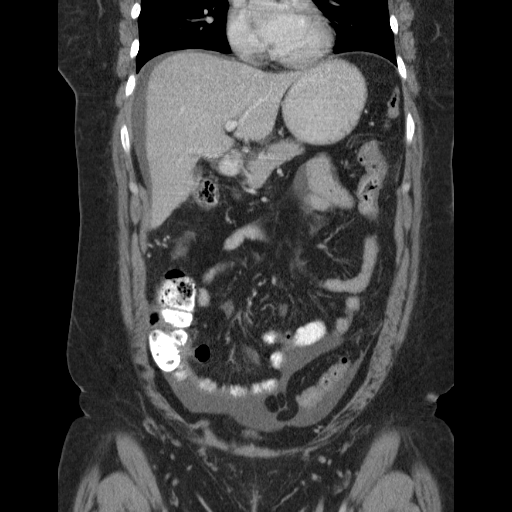
[im 54/98  soft-tissue]
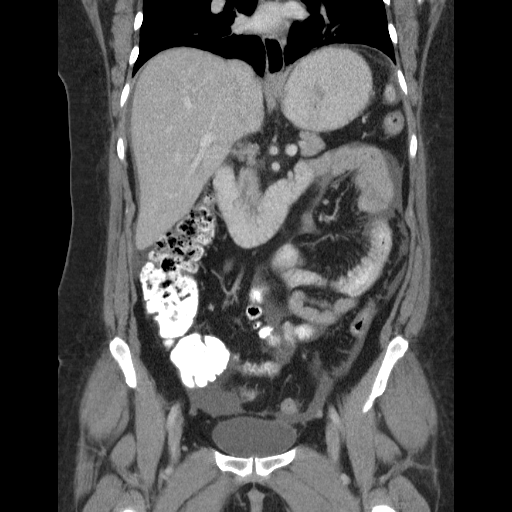

[16 of 46 positions shown; findings below may reference images not displayed]

FINDINGS: Bibasilar atelectasis is noted.

Small volume abdominopelvic ascites is noted, with fluid
surrounding the liver and spleen, and tracking along multiple small
bowel loops.

Tiny foci of air are noted with respect to the patient's
laparoscopy portal sites.

The liver and spleen are unremarkable in appearance.  The
gallbladder is within normal limits.  The pancreas and adrenal
glands are unremarkable.  The kidneys are within normal limits
bilaterally; no hydronephrosis or perinephric stranding is seen.

No free fluid is identified.  The small bowel is unremarkable in
appearance.  The stomach is within normal limits.  No acute
vascular abnormalities are seen.

The appendix is normal in caliber and contains contrast, without
evidence for appendicitis.  Contrast progresses to the level of the
ascending colon; the colon is unremarkable in appearance.  Mild
apparent thickening of the wall of the descending colon likely
reflects relative decompression.

The bladder is mildly distended; a small amount of free air within
the bladder may reflect recent Foley catheter placement.  Suggest
clinical correlation.  Bilateral ureteral  jets are seen.  The
patient is status post hysterectomy.  Several small cystic lesions
are noted within the left ovary, likely physiologic in nature given
the patient's age.  The right ovary is unremarkable in appearance.
No inguinal lymphadenopathy is seen.

No acute osseous abnormalities are identified.
IMPRESSION: 1.  No acute abnormalities identified within the abdomen or pelvis.
2.  Small amount of free air within the bladder likely reflects
recent Foley catheter placement.  Suggest clinical correlation.
3.  Small volume abdominopelvic ascites likely reflects recent
surgery.
4.  Bibasilar atelectasis noted.

## 2012-06-23 ENCOUNTER — Other Ambulatory Visit: Payer: Self-pay | Admitting: Internal Medicine

## 2012-06-23 ENCOUNTER — Telehealth: Payer: Self-pay | Admitting: Internal Medicine

## 2012-06-23 MED ORDER — OMEPRAZOLE 40 MG PO CPDR: 40.0000 mg | DELAYED_RELEASE_CAPSULE | Freq: Two times a day (BID) | ORAL | Status: DC

## 2012-06-23 NOTE — Telephone Encounter (Signed)
Rx faxed to pharmacy  

## 2012-06-23 NOTE — Telephone Encounter (Signed)
rx sent

## 2012-07-06 DIAGNOSIS — IMO0002 Reserved for concepts with insufficient information to code with codable children: Secondary | ICD-10-CM | POA: Insufficient documentation

## 2012-07-06 DIAGNOSIS — N816 Rectocele: Secondary | ICD-10-CM | POA: Insufficient documentation

## 2012-07-06 DIAGNOSIS — G8929 Other chronic pain: Secondary | ICD-10-CM | POA: Insufficient documentation

## 2012-07-10 ENCOUNTER — Telehealth: Payer: Self-pay | Admitting: Internal Medicine

## 2012-07-10 NOTE — Telephone Encounter (Signed)
Attempted call back, vmail left at [phone] on 5-19.

## 2012-07-11 ENCOUNTER — Encounter: Payer: Self-pay | Admitting: Internal Medicine

## 2012-07-11 ENCOUNTER — Ambulatory Visit (INDEPENDENT_AMBULATORY_CARE_PROVIDER_SITE_OTHER): Payer: BC Managed Care – PPO | Admitting: Internal Medicine

## 2012-07-11 VITALS — BP 108/68 | HR 82 | Temp 98.5°F | Ht 67.0 in | Wt 203.0 lb

## 2012-07-11 DIAGNOSIS — R110 Nausea: Secondary | ICD-10-CM

## 2012-07-11 DIAGNOSIS — R5381 Other malaise: Secondary | ICD-10-CM

## 2012-07-11 DIAGNOSIS — R197 Diarrhea, unspecified: Secondary | ICD-10-CM

## 2012-07-11 DIAGNOSIS — R5383 Other fatigue: Secondary | ICD-10-CM

## 2012-07-11 MED ORDER — ONDANSETRON HCL 8 MG PO TABS: 8.0000 mg | ORAL_TABLET | Freq: Three times a day (TID) | ORAL | Status: DC | PRN

## 2012-07-11 NOTE — Progress Notes (Signed)
Subjective:    Patient ID: [name] White, female    DOB: 07-Feb-1966, 47 y.o.   MRN: [id number]  HPI  Pt presents to the clinic today with c/o diarrhea for the past 6 days. It has actually now resolved. The worst part now is the nausea and fatigue. She had peen taking lomotil for the diarrhea. She does have a history of IBS. She is being followed by Dr. Dickie La for that. She has not had contacts with people with similar symptoms. She has not recently been on antibiotics. She did have chills but denies fever or body aches. She feels like she is getting better. She wanted to cancel this appointment but was told she would be charged a fee for canceling.  Review of Systems      Past Medical History  Diagnosis Date  . Depression   . Barrett's esophagus     egd 2007 + 2009 but none 2011 or 08/2010 egd  . Colon polyp     hyperplastic  . Grief reaction   . Chronic headache   . Tobacco abuse   . Migraine   . Restless leg   . IBS (irritable bowel syndrome)   . Hidradenitis suppurativa   . Ovarian cyst   . OSA (obstructive sleep apnea)     Current Outpatient Prescriptions  Medication Sig Dispense Refill  . ALPRAZolam (XANAX) 0.25 MG tablet TAKE 1 TABLET BY MOUTH 3 TIMES A DAY AS NEEDED FOR SLEEP/ANXIETY  30 tablet  1  . ARIPiprazole (ABILIFY) 10 MG tablet Take 1 tablet (10 mg total) by mouth daily.  30 tablet  3  . estradiol (ESTRACE) 1 MG tablet Take 1 tablet (1 mg total) by mouth daily.  30 tablet  2  . GRALISE 600 MG TABS 3 tablets in the afternoon      . HYDROmorphone (DILAUDID) 4 MG tablet Take 4 mg by mouth every 4 (four) hours as needed. Every 4-6 hrs PRN Pain.      Marland Kitchen omeprazole (PRILOSEC) 40 MG capsule Take 1 capsule (40 mg total) by mouth 2 (two) times daily.  60 capsule  1  . ondansetron (ZOFRAN) 8 MG tablet Take 1 tablet (8 mg total) by mouth every 8 (eight) hours as needed for nausea.  30 tablet  2  . rOPINIRole (REQUIP) 2 MG tablet TAKE 1 TABLET (2 MG TOTAL) BY MOUTH AT BEDTIME.   30 tablet  5  . venlafaxine XR (EFFEXOR-XR) 150 MG 24 hr capsule Take 1 capsule (150 mg total) by mouth daily.  90 capsule  3  . zolpidem (AMBIEN) 5 MG tablet Take 1 tablet (5 mg total) by mouth at bedtime as needed for sleep.  30 tablet  5   No current facility-administered medications for this visit.    Allergies  Allergen Reactions  . Morphine And Related   . Penicillins     REACTION: hives    Family History  Problem Relation Age of Onset  . COPD Mother   . Stroke Mother   . Coronary artery disease Mother   . Colon polyps Mother   . Breast cancer Mother   . Hypertension Father   . Colon polyps Brother   . Colon cancer Maternal Grandfather   . Colon polyps Maternal Grandfather   . Breast cancer Maternal Aunt   . Uterine cancer Cousin     x 2    History   Social History  . Marital Status: Divorced    Spouse Name: N/A  Number of Children: 2  . Years of Education: N/A   Occupational History  . unemployed    Social History Main Topics  . Smoking status: Current Every Day Smoker -- 1.00 packs/day for 30 years    Types: Cigarettes  . Smokeless tobacco: Former Neurosurgeon     Comment: BUT NOT READY  . Alcohol Use: Yes     Comment: rare  . Drug Use: No  . Sexually Active: Not on file   Other Topics Concern  . 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  Gastrointestinal: Pt reports nausea. Denies abdominal pain, bloating, constipation, or blood in the stool.  Neurological: Denies dizziness, difficulty with memory, difficulty with speech or problems with balance and coordination.   No other specific complaints in a complete review of systems (except as listed in HPI above).  Objective:   Physical Exam   BP 108/68  Pulse 82  Temp(Src)  98.5 F (36.9 C) (Oral)  Ht 5\' 7"  (1.702 m)  Wt 203 lb (92.08 kg)  BMI 31.79 kg/m2  SpO2 98% Wt Readings from Last 3 Encounters:  07/11/12 203 lb (92.08 kg)  05/31/12 204 lb 6.4 oz (92.715 kg)  05/22/12 199 lb (90.266 kg)    General: Appears her stated age, well developed, well nourished in NAD. Skin: Warm, dry and intact. No rashes, lesions or ulcerations noted. Good skin turgor, mucous membranes moist. Cardiovascular: Normal rate and rhythm. S1,S2 noted.  No murmur, rubs or gallops noted. No JVD or BLE edema. No carotid bruits noted. Pulmonary/Chest: Normal effort and positive vesicular breath sounds. No respiratory distress. No wheezes, rales or ronchi noted.  Abdomen: Soft and nontender. Normal bowel sounds, no bruits noted. No distention or masses noted. Liver, spleen and kidneys non palpable.  Neurological: Alert and oriented. Cranial nerves II-XII intact. Coordination normal. +DTRs bilaterally.        Assessment & Plan:  Diarrhea, likely viral with some associated nausea and fatigue:  Rest and drink plenty of fluids Refilled Zofran  RTC as needed or if symptoms persist or worsen

## 2012-07-11 NOTE — Patient Instructions (Signed)
Diarrhea Infections caused by germs (bacterial) or a virus commonly cause diarrhea. Your caregiver has determined that with time, rest and fluids, the diarrhea should improve. In general, eat normally while drinking more water than usual. Although water may prevent dehydration, it does not contain salt and minerals (electrolytes). Broths, weak tea without caffeine and oral rehydration solutions (ORS) replace fluids and electrolytes. Small amounts of fluids should be taken frequently. Large amounts at one time may not be tolerated. Plain water may be harmful in infants and the elderly. Oral rehydrating solutions (ORS) are available at pharmacies and grocery stores. ORS replace water and important electrolytes in proper proportions. Sports drinks are not as effective as ORS and may be harmful due to sugars worsening diarrhea.  ORS is especially recommended for use in children with diarrhea. As a general guideline for children, replace any new fluid losses from diarrhea and/or vomiting with ORS as follows:  If your child weighs 22 pounds or under (10 kg or less), give 60-120 mL ( -  cup or 2 - 4 ounces) of ORS for each episode of diarrheal stool or vomiting episode.  If your child weighs more than 22 pounds (more than 10 kgs), give 120-240 mL ( - 1 cup or 4 - 8 ounces) of ORS for each diarrheal stool or episode of vomiting.  While correcting for dehydration, children should eat normally. However, foods high in sugar should be avoided because this may worsen diarrhea. Large amounts of carbonated soft drinks, juice, gelatin desserts and other highly sugared drinks should be avoided.  After correction of dehydration, other liquids that are appealing to the child may be added. Children should drink small amounts of fluids frequently and fluids should be increased as tolerated. Children should drink enough fluids to keep urine clear or pale yellow.  Adults should eat normally while drinking more fluids than  usual. Drink small amounts of fluids frequently and increase as tolerated. Drink enough fluids to keep urine clear or pale yellow. Broths, weak decaffeinated tea, lemon lime soft drinks (allowed to go flat) and ORS replace fluids and electrolytes.  Avoid:  Carbonated drinks.  Juice.  Extremely hot or cold fluids.  Caffeine drinks.  Fatty, greasy foods.  Alcohol.  Tobacco.  Too much intake of anything at one time.  Gelatin desserts.  Probiotics are active cultures of beneficial bacteria. They may lessen the amount and number of diarrheal stools in adults. Probiotics can be found in yogurt with active cultures and in supplements.  Wash hands well to avoid spreading bacteria and virus.  Anti-diarrheal medications are not recommended for infants and children.  Only take over-the-counter or prescription medicines for pain, discomfort or fever as directed by your caregiver. Do not give aspirin to children because it may cause Reye's Syndrome.  For adults, ask your caregiver if you should continue all prescribed and over-the-counter medicines.  If your caregiver has given you a follow-up appointment, it is very important to keep that appointment. Not keeping the appointment could result in a chronic or permanent injury, and disability. If there is any problem keeping the appointment, you must call back to this facility for assistance. SEEK IMMEDIATE MEDICAL CARE IF:   You or your child is unable to keep fluids down or other symptoms or problems become worse in spite of treatment.  Vomiting or diarrhea develops and becomes persistent.  There is vomiting of blood or bile (green material).  There is blood in the stool or the stools are black and   develops and becomes persistent.   There is vomiting of blood or bile (green material).   There is blood in the stool or the stools are black and tarry.   There is no urine output in 6-8 hours or there is only a small amount of very dark urine.   Abdominal pain develops, increases or localizes.   You have a fever.   Your baby is older than 3 months with a rectal temperature of 102 F  (38.9 C) or higher.   Your baby is 3 months old or younger with a rectal temperature of 100.4 F (38 C) or higher.   You or your child develops excessive weakness, dizziness, fainting or extreme thirst.   You or your child develops a rash, stiff neck, severe headache or become irritable or sleepy and difficult to awaken.  MAKE SURE YOU:    Understand these instructions.   Will watch your condition.   Will get help right away if you are not doing well or get worse.  Document Released: 01/29/2002 Document Revised: 05/03/2011 Document Reviewed: 12/16/2008  [employer] Patient Information 2013 [employer].  Diet for Diarrhea, Adult  Having frequent, runny stools (diarrhea) has many causes. Diarrhea may be caused or worsened by food or drink. Diarrhea may be relieved by changing your diet.  IF YOU ARE NOT TOLERATING SOLID FOODS:   Drink enough water and fluids to keep your urine clear or pale yellow.   Avoid sugary drinks and sodas as well as milk-based beverages.   Avoid beverages containing caffeine and alcohol.   You may try rehydrating beverages. You can make your own by following this recipe:    tsp table salt.    tsp baking soda.    tsp salt substitute (potassium chloride).   1 tbs + 1 tsp sugar.   1 qt water.  As your stools become more solid, you can start eating solid foods. Add foods one at a time. If a certain food causes your diarrhea to get worse, avoid that food and try other foods. A low fiber, low-fat, and lactose-free diet is recommended. Small, frequent meals may be better tolerated.   Starches   Allowed:  White, French, and pita breads, plain rolls, buns, bagels. Plain muffins, matzo. Soda, saltine, or graham crackers. Pretzels, melba toast, zwieback. Cooked cereals made with water: cornmeal, farina, cream cereals. Dry cereals: refined corn, wheat, rice. Potatoes prepared any way without skins, refined macaroni, spaghetti, noodles, refined rice.   Avoid:  Bread, rolls, or crackers  made with whole wheat, multi-grains, rye, bran seeds, nuts, or coconut. Corn tortillas or taco shells. Cereals containing whole grains, multi-grains, bran, coconut, nuts, or raisins. Cooked or dry oatmeal. Coarse wheat cereals, granola. Cereals advertised as "high-fiber." Potato skins. Whole grain pasta, wild or brown rice. Popcorn. Sweet potatoes/yams. Sweet rolls, doughnuts, waffles, pancakes, sweet breads.  Vegetables   Allowed: Strained tomato and vegetable juices. Most well-cooked and canned vegetables without seeds. Fresh: Tender lettuce, cucumber without the skin, cabbage, spinach, bean sprouts.   Avoid: Fresh, cooked, or canned: Artichokes, baked beans, beet greens, broccoli, Brussels sprouts, corn, kale, legumes, peas, sweet potatoes. Cooked: Green or red cabbage, spinach. Avoid large servings of any vegetables, because vegetables shrink when cooked, and they contain more fiber per serving than fresh vegetables.  Fruit   Allowed: All fruit juices except prune juice. Cooked or canned: Apricots, applesauce, cantaloupe, cherries, fruit cocktail, grapefruit, grapes, kiwi, mandarin oranges, peaches, pears, plums, watermelon. Fresh: Apples without skin, ripe banana,   grapes, cantaloupe, cherries, grapefruit, peaches, oranges, plums. Keep servings limited to  cup or 1 piece.   Avoid: Fresh: Apple with skin, apricots, mango, pears, raspberries, strawberries. Prune juice, stewed or dried prunes. Dried fruits, raisins, dates. Large servings of all fresh fruits.  Meat and Meat Substitutes   Allowed: Ground or well-cooked tender beef, ham, veal, lamb, pork, or poultry. Eggs, plain cheese. Fish, oysters, shrimp, lobster, other seafoods. Liver, organ meats.   Avoid: Tough, fibrous meats with gristle. Peanut butter, smooth or chunky. Cheese, nuts, seeds, legumes, dried peas, beans, lentils.  Milk   Allowed: Yogurt, lactose-free milk, kefir, drinkable yogurt, buttermilk, soy milk.   Avoid: Milk, chocolate milk,  beverages made with milk, such as milk shakes.  Soups   Allowed: Bouillon, broth, or soups made from allowed foods. Any strained soup.   Avoid: Soups made from vegetables that are not allowed, cream or milk-based soups.  Desserts and Sweets   Allowed: Sugar-free gelatin, sugar-free frozen ice pops made without sugar alcohol.   Avoid: Plain cakes and cookies, pie made with allowed fruit, pudding, custard, cream pie. Gelatin, fruit, ice, sherbet, frozen ice pops. Ice cream, ice milk without nuts. Plain hard candy, honey, jelly, molasses, syrup, sugar, chocolate syrup, gumdrops, marshmallows.  Fats and Oils   Allowed: Avoid any fats and oils.   Avoid: Seeds, nuts, olives, avocados. Margarine, butter, cream, mayonnaise, salad oils, plain salad dressings made from allowed foods. Plain gravy, crisp bacon without rind.  Beverages   Allowed: Water, decaffeinated teas, oral rehydration solutions, sugar-free beverages.   Avoid: Fruit juices, caffeinated beverages (coffee, tea, soda or pop), alcohol, sports drinks, or lemon-lime soda or pop.  Condiments   Allowed: Ketchup, mustard, horseradish, vinegar, cream sauce, cheese sauce, cocoa powder. Spices in moderation: allspice, basil, bay leaves, celery powder or leaves, cinnamon, cumin powder, curry powder, ginger, mace, marjoram, onion or garlic powder, oregano, paprika, parsley flakes, ground pepper, rosemary, sage, savory, tarragon, thyme, turmeric.   Avoid: Coconut, honey.  Weight Monitoring: Weigh yourself every day. You should weigh yourself in the morning after you urinate and before you eat breakfast. Wear the same amount of clothing when you weigh yourself. Record your weight daily. Bring your recorded weights to your clinic visits. Tell your caregiver right away if you have gained 3 lb/1.4 kg or more in 1 day, 5 lb/2.3 kg in a week, or whatever amount you were told to report.  SEEK IMMEDIATE MEDICAL CARE IF:    You are unable to keep fluids down.   You  start to throw up (vomit) or diarrhea keeps coming back (persistent).   Abdominal pain develops, increases, or can be felt in one place (localizes).   You have an oral temperature above 102 F (38.9 C), not controlled by medicine.   Diarrhea contains blood or mucus.   You develop excessive weakness, dizziness, fainting, or extreme thirst.  MAKE SURE YOU:    Understand these instructions.   Will watch your condition.   Will get help right away if you are not doing well or get worse.  Document Released: 05/01/2003 Document Revised: 05/03/2011 Document Reviewed: 06/25/2011  [employer] Patient Information 2013 [employer].

## 2012-08-08 ENCOUNTER — Ambulatory Visit: Payer: BC Managed Care – PPO | Admitting: Internal Medicine

## 2012-08-29 ENCOUNTER — Other Ambulatory Visit: Payer: Self-pay | Admitting: Internal Medicine

## 2012-08-29 ENCOUNTER — Other Ambulatory Visit: Payer: Self-pay | Admitting: Pulmonary Disease

## 2012-08-30 ENCOUNTER — Ambulatory Visit (INDEPENDENT_AMBULATORY_CARE_PROVIDER_SITE_OTHER): Payer: BC Managed Care – PPO | Admitting: Internal Medicine

## 2012-08-30 ENCOUNTER — Other Ambulatory Visit (INDEPENDENT_AMBULATORY_CARE_PROVIDER_SITE_OTHER): Payer: BC Managed Care – PPO

## 2012-08-30 ENCOUNTER — Encounter: Payer: Self-pay | Admitting: Internal Medicine

## 2012-08-30 VITALS — BP 118/82 | HR 83 | Temp 97.8°F | Wt 207.0 lb

## 2012-08-30 DIAGNOSIS — Z0000 Encounter for general adult medical examination without abnormal findings: Secondary | ICD-10-CM

## 2012-08-30 DIAGNOSIS — F17200 Nicotine dependence, unspecified, uncomplicated: Secondary | ICD-10-CM

## 2012-08-30 DIAGNOSIS — E785 Hyperlipidemia, unspecified: Secondary | ICD-10-CM

## 2012-08-30 LAB — BASIC METABOLIC PANEL
CO2: 27 mEq/L (ref 19–32)
Calcium: 9.3 mg/dL (ref 8.4–10.5)
GFR: 75.91 mL/min (ref >60.00)
Glucose, Bld: 83 mg/dL (ref 70–99)
Potassium: 4.7 mEq/L (ref 3.5–5.1)
Sodium: 142 mEq/L (ref 135–145)

## 2012-08-30 LAB — CBC WITH DIFFERENTIAL/PLATELET
Basophils Absolute: 0.1 10*3/uL (ref 0.0–0.1)
Basophils Relative: 0.8 % (ref 0.0–3.0)
HCT: 38 % (ref 36.0–46.0)
Hemoglobin: 13 g/dL (ref 12.0–15.0)
Lymphocytes Relative: 38.3 % (ref 12.0–46.0)
Lymphs Abs: 2.9 10*3/uL (ref 0.7–4.0)
Monocytes Relative: 9.2 % (ref 3.0–12.0)
Neutro Abs: 3.6 10*3/uL (ref 1.4–7.7)
RBC: 4.04 Mil/uL (ref 3.87–5.11)
RDW: 13.2 % (ref 11.5–14.6)

## 2012-08-30 LAB — URINALYSIS, ROUTINE W REFLEX MICROSCOPIC
Bilirubin Urine: NEGATIVE
Ketones, ur: NEGATIVE
Specific Gravity, Urine: >=1.03 (ref 1.000–1.030)
Total Protein, Urine: NEGATIVE
Urine Glucose: NEGATIVE
pH: 6 (ref 5.0–8.0)

## 2012-08-30 LAB — LIPID PANEL
Cholesterol: 222 mg/dL — ABNORMAL HIGH (ref 0–200)
HDL: 56 mg/dL (ref >39.00)
Total CHOL/HDL Ratio: 4
Triglycerides: 93 mg/dL (ref 0.0–149.0)

## 2012-08-30 LAB — LDL CHOLESTEROL, DIRECT: Direct LDL: 160.5 mg/dL

## 2012-08-30 LAB — HEPATIC FUNCTION PANEL
Albumin: 3.9 g/dL (ref 3.5–5.2)
Total Bilirubin: 0.6 mg/dL (ref 0.3–1.2)

## 2012-08-30 MED ORDER — PRAVASTATIN SODIUM 20 MG PO TABS: 20.0000 mg | ORAL_TABLET | Freq: Every day | ORAL | Status: DC

## 2012-08-30 MED ORDER — ALPRAZOLAM 0.25 MG PO TABS: 0.2500 mg | ORAL_TABLET | Freq: Three times a day (TID) | ORAL | Status: DC | PRN

## 2012-08-30 MED ORDER — ZOLPIDEM TARTRATE 5 MG PO TABS: 5.0000 mg | ORAL_TABLET | Freq: Every evening | ORAL | Status: DC | PRN

## 2012-08-30 NOTE — Telephone Encounter (Signed)
Rx has been called in per VS. 

## 2012-08-30 NOTE — Telephone Encounter (Signed)
Faxed script back to [facility].../lmb 

## 2012-08-30 NOTE — Addendum Note (Signed)
Addended by: Caryl Ada on: 08/30/2012 10:21 AM   Modules accepted: Orders

## 2012-08-30 NOTE — Assessment & Plan Note (Signed)
5 minutes today spent counseling patient on unhealthy effects of continued tobacco abuse and encouragement of cessation including medical options available to help the patient quit smoking. 

## 2012-08-30 NOTE — Addendum Note (Signed)
Addended by: Rene Paci A on: 08/30/2012 08:04 PM   Modules accepted: Orders, Level of Service

## 2012-08-30 NOTE — Progress Notes (Signed)
Subjective:    Patient ID: [name] White, female    DOB: 12/16/1965, 47 y.o.   MRN: [id number]  HPI  patient is here today for annual physical. Patient feels well overall.   Past Medical History  Diagnosis Date  . Depression   . Barrett's esophagus     egd 2007 + 2009 but none 2011 or 08/2010 egd  . Colon polyp     hyperplastic  . Grief reaction   . Chronic headache   . Tobacco abuse   . Migraine   . Restless leg   . IBS (irritable bowel syndrome)   . Hidradenitis suppurativa   . Ovarian cyst   . OSA (obstructive sleep apnea)    Family History  Problem Relation Age of Onset  . COPD Mother   . Stroke Mother   . Coronary artery disease Mother   . Colon polyps Mother   . Breast cancer Mother   . Hypertension Father   . Colon polyps Brother   . Colon cancer Maternal Grandfather   . Colon polyps Maternal Grandfather   . Breast cancer Maternal Aunt   . Uterine cancer Cousin     x 2   History  Substance Use Topics  . Smoking status: Current Every Day Smoker -- 1.00 packs/day for 30 years    Types: Cigarettes  . Smokeless tobacco: Former Neurosurgeon     Comment: BUT NOT READY  . Alcohol Use: Yes     Comment: rare   Review of Systems Constitutional: Negative for fever or weight change.  Respiratory: Negative for cough and shortness of breath.   Cardiovascular: Negative for chest pain or palpitations.  Gastrointestinal: Negative for abdominal pain, no bowel changes.  Musculoskeletal: Negative for gait problem or joint swelling.  Skin: Negative for rash.  Neurological: Negative for dizziness or headache.  No other specific complaints in a complete review of systems (except as listed in HPI above).      Objective:   Physical Exam  BP 118/82  Pulse 83  Temp(Src) 97.8 F (36.6 C) (Oral)  Wt 207 lb (93.895 kg)  BMI 32.41 kg/m2  SpO2 97% Wt Readings from Last 3 Encounters:  08/30/12 207 lb (93.895 kg)  07/11/12 203 lb (92.08 kg)  05/31/12 204 lb 6.4 oz (92.715 kg)    Constitutional: She is overweight, but appears well-developed and well-nourished. No distress. HENT: Head: Normocephalic and atraumatic. Ears: B TMs ok, no erythema or effusion; Nose: Nose normal. Mouth/Throat: Oropharynx is clear and moist. No oropharyngeal exudate.  Eyes: Conjunctivae and EOM are normal. Pupils are equal, round, and reactive to light. No scleral icterus.  Neck: Normal range of motion. Neck supple. No JVD present. No thyromegaly present.  Cardiovascular: Normal rate, regular rhythm and normal heart sounds.  No murmur heard. No BLE edema. Pulmonary/Chest: Effort normal and breath sounds normal. No respiratory distress. She has no wheezes.  Abdominal: Soft. Bowel sounds are normal. She exhibits no distension. There is no tenderness. no masses Musculoskeletal: Normal range of motion, no joint effusions. No gross deformities Neurological: She is alert and oriented to person, place, and time. No cranial nerve deficit. Coordination, balance, strength, speech and gait are normal.  Skin: Skin is warm and dry. No rash noted. No erythema.  Psychiatric: She has a normal mood and affect. Her behavior is normal. Judgment and thought content normal.    Lab Results  Component Value Date   WBC 5.7 08/23/2011   HGB 13.8 08/23/2011   HCT 41.1  08/23/2011   PLT 180.0 08/23/2011   CHOL 210* 04/28/2009   TRIG 100.0 04/28/2009   HDL 51.10 04/28/2009   LDLDIRECT 151.9 04/28/2009   ALT 25 08/23/2011   AST 24 08/23/2011   NA 139 08/23/2011   K 4.3 08/23/2011   CL 104 08/23/2011   CREATININE 0.7 08/23/2011   BUN 10 08/23/2011   CO2 26 08/23/2011   TSH 0.60 08/23/2011   INR 1.07 04/30/2010       Assessment & Plan:   CPX/v70.0 - Patient has been counseled on age-appropriate routine health concerns for screening and prevention. These are reviewed and up-to-date. Immunizations are up-to-date or declined. Labs ordered and reviewed.   See problem list. Medications and labs reviewed today.

## 2012-08-30 NOTE — Assessment & Plan Note (Signed)
LDL >160 Given smoking status and weight gain, recommended to start prava 20mg  qhs - erx done The patient is asked to make an attempt to improve diet and exercise patterns to aid in medical management of this problem.  Also see above re: request to reconsider cessation to decrease CAD/CVA risk

## 2012-08-30 NOTE — Patient Instructions (Signed)
It was good to see you today. Health Maintenance reviewed - all recommended immunizations and age-appropriate screenings are up-to-date. We have reviewed your prior records including labs and tests today Test(s) ordered today. Your results will be released to MyChart (or called to you) after review, usually within 72hours after test completion. If any changes need to be made, you will be notified at that same time. Medications reviewed and updated, no changes recommended at this time. Refill on medication(s) as discussed today. Please schedule followup in 12 months for annual visit and labs, call sooner if problems.  Health Maintenance, Females A healthy lifestyle and preventative care can promote health and wellness.  Maintain regular health, dental, and eye exams.  Eat a healthy diet. Foods like vegetables, fruits, whole grains, low-fat dairy products, and lean protein foods contain the nutrients you need without too many calories. Decrease your intake of foods high in solid fats, added sugars, and salt. Get information about a proper diet from your caregiver, if necessary.  Regular physical exercise is one of the most important things you can do for your health. Most adults should get at least 150 minutes of moderate-intensity exercise (any activity that increases your heart rate and causes you to sweat) each week. In addition, most adults need muscle-strengthening exercises on 2 or more days a week.   Maintain a healthy weight. The body mass index (BMI) is a screening tool to identify possible weight problems. It provides an estimate of body fat based on height and weight. Your caregiver can help determine your BMI, and can help you achieve or maintain a healthy weight. For adults 20 years and older:  A BMI below 18.5 is considered underweight.  A BMI of 18.5 to 24.9 is normal.  A BMI of 25 to 29.9 is considered overweight.  A BMI of 30 and above is considered obese.  Maintain normal  blood lipids and cholesterol by exercising and minimizing your intake of saturated fat. Eat a balanced diet with plenty of fruits and vegetables. Blood tests for lipids and cholesterol should begin at age 71 and be repeated every 5 years. If your lipid or cholesterol levels are high, you are over 50, or you are a high risk for heart disease, you may need your cholesterol levels checked more frequently.Ongoing high lipid and cholesterol levels should be treated with medicines if diet and exercise are not effective.  If you smoke, find out from your caregiver how to quit. If you do not use tobacco, do not start.  If you are pregnant, do not drink alcohol. If you are breastfeeding, be very cautious about drinking alcohol. If you are not pregnant and choose to drink alcohol, do not exceed 1 drink per day. One drink is considered to be 12 ounces (355 mL) of beer, 5 ounces (148 mL) of wine, or 1.5 ounces (44 mL) of liquor.  Avoid use of street drugs. Do not share needles with anyone. Ask for help if you need support or instructions about stopping the use of drugs.  High blood pressure causes heart disease and increases the risk of stroke. Blood pressure should be checked at least every 1 to 2 years. Ongoing high blood pressure should be treated with medicines, if weight loss and exercise are not effective.  If you are 75 to 48 years old, ask your caregiver if you should take aspirin to prevent strokes.  Diabetes screening involves taking a blood sample to check your fasting blood sugar level. This should be  done once every 3 years, after age 76, if you are within normal weight and without risk factors for diabetes. Testing should be considered at a younger age or be carried out more frequently if you are overweight and have at least 1 risk factor for diabetes.  Breast cancer screening is essential preventative care for women. You should practice "breast self-awareness." This means understanding the normal  appearance and feel of your breasts and may include breast self-examination. Any changes detected, no matter how small, should be reported to a caregiver. Women in their 16s and 30s should have a clinical breast exam (CBE) by a caregiver as part of a regular health exam every 1 to 3 years. After age 86, women should have a CBE every year. Starting at age 50, women should consider having a mammogram (breast X-ray) every year. Women who have a family history of breast cancer should talk to their caregiver about genetic screening. Women at a high risk of breast cancer should talk to their caregiver about having an MRI and a mammogram every year.  The Pap test is a screening test for cervical cancer. Women should have a Pap test starting at age 90. Between ages 72 and 33, Pap tests should be repeated every 2 years. Beginning at age 35, you should have a Pap test every 3 years as long as the past 3 Pap tests have been normal. If you had a hysterectomy for a problem that was not cancer or a condition that could lead to cancer, then you no longer need Pap tests. If you are between ages 68 and 28, and you have had normal Pap tests going back 10 years, you no longer need Pap tests. If you have had past treatment for cervical cancer or a condition that could lead to cancer, you need Pap tests and screening for cancer for at least 20 years after your treatment. If Pap tests have been discontinued, risk factors (such as a new sexual partner) need to be reassessed to determine if screening should be resumed. Some women have medical problems that increase the chance of getting cervical cancer. In these cases, your caregiver may recommend more frequent screening and Pap tests.  The human papillomavirus (HPV) test is an additional test that may be used for cervical cancer screening. The HPV test looks for the virus that can cause the cell changes on the cervix. The cells collected during the Pap test can be tested for HPV. The  HPV test could be used to screen women aged 46 years and older, and should be used in women of any age who have unclear Pap test results. After the age of 69, women should have HPV testing at the same frequency as a Pap test.  Colorectal cancer can be detected and often prevented. Most routine colorectal cancer screening begins at the age of 24 and continues through age 81. However, your caregiver may recommend screening at an earlier age if you have risk factors for colon cancer. On a yearly basis, your caregiver may provide home test kits to check for hidden blood in the stool. Use of a small camera at the end of a tube, to directly examine the colon (sigmoidoscopy or colonoscopy), can detect the earliest forms of colorectal cancer. Talk to your caregiver about this at age 48, when routine screening begins. Direct examination of the colon should be repeated every 5 to 10 years through age 20, unless early forms of pre-cancerous polyps or small growths are found.  Hepatitis C blood testing is recommended for all people born from [birth year] through 1966 and any individual with known risks for hepatitis C.  Practice safe sex. Use condoms and avoid high-risk sexual practices to reduce the spread of sexually transmitted infections (STIs). Sexually active women aged 32 and younger should be checked for Chlamydia, which is a common sexually transmitted infection. Older women with new or multiple partners should also be tested for Chlamydia. Testing for other STIs is recommended if you are sexually active and at increased risk.  Osteoporosis is a disease in which the bones lose minerals and strength with aging. This can result in serious bone fractures. The risk of osteoporosis can be identified using a bone density scan. Women ages 77 and over and women at risk for fractures or osteoporosis should discuss screening with their caregivers. Ask your caregiver whether you should be taking a calcium supplement or vitamin D  to reduce the rate of osteoporosis.  Menopause can be associated with physical symptoms and risks. Hormone replacement therapy is available to decrease symptoms and risks. You should talk to your caregiver about whether hormone replacement therapy is right for you.  Use sunscreen with a sun protection factor (SPF) of 30 or greater. Apply sunscreen liberally and repeatedly throughout the day. You should seek shade when your shadow is shorter than you. Protect yourself by wearing long sleeves, pants, a wide-brimmed hat, and sunglasses year round, whenever you are outdoors.  Notify your caregiver of new moles or changes in moles, especially if there is a change in shape or color. Also notify your caregiver if a mole is larger than the size of a pencil eraser.  Stay current with your immunizations. Document Released: 08/24/2010 Document Revised: 05/03/2011 Document Reviewed: 08/24/2010 [facility] Patient Information 2014 [place], [place].

## 2012-08-30 NOTE — Telephone Encounter (Signed)
Last OV 01/28/2012 No pending OV Last fill 01/28/12 with 5 additional refills  Dr. Craige Cotta please advise if you are okay with this being refilled. Thanks.

## 2012-10-09 ENCOUNTER — Ambulatory Visit (INDEPENDENT_AMBULATORY_CARE_PROVIDER_SITE_OTHER): Payer: BC Managed Care – PPO | Admitting: Internal Medicine

## 2012-10-09 ENCOUNTER — Encounter: Payer: Self-pay | Admitting: Internal Medicine

## 2012-10-09 VITALS — BP 110/68 | HR 92 | Temp 97.5°F | Wt 214.4 lb

## 2012-10-09 DIAGNOSIS — G894 Chronic pain syndrome: Secondary | ICD-10-CM

## 2012-10-09 DIAGNOSIS — F32A Depression, unspecified: Secondary | ICD-10-CM

## 2012-10-09 DIAGNOSIS — N949 Unspecified condition associated with female genital organs and menstrual cycle: Secondary | ICD-10-CM

## 2012-10-09 DIAGNOSIS — F329 Major depressive disorder, single episode, unspecified: Secondary | ICD-10-CM

## 2012-10-09 DIAGNOSIS — N390 Urinary tract infection, site not specified: Secondary | ICD-10-CM

## 2012-10-09 DIAGNOSIS — R102 Pelvic and perineal pain: Secondary | ICD-10-CM

## 2012-10-09 MED ORDER — CIPROFLOXACIN HCL 500 MG PO TABS: 500.0000 mg | ORAL_TABLET | Freq: Two times a day (BID) | ORAL | Status: DC

## 2012-10-09 NOTE — Progress Notes (Signed)
HPI: complains of UTI symptoms Onset 4 days ago, progressively worse associated with dysuria and small volume voiding with increased frequency denies hematuria, flank pain or fever The patient has a history of prior UTI  Also complains of increase tearfulness and emotional liability since stopping Abilify  Also needs refer for new pain clinic  PMH: reviewed  ROS:  Gen.: No unexpected weight change, no night sweats Lungs: No cough or shortness of breath Cardiovascular: No palpitations or chest pain  PE: BP 110/68  Pulse 92  Temp(Src) 97.5 F (36.4 C) (Oral)  Wt 214 lb 6.4 oz (97.251 kg)  BMI 33.57 kg/m2  SpO2 96% General: No acute distress, "James" at side Lungs: Clear to auscultation Cardiovascular: Regular rate rhythm, no edema Abdomen: Mild to moderate discomfort of her suprapubic region, no flank tenderness to palpation Psyc: emotional, tearful  Lab Results  Component Value Date   WBC 7.7 08/30/2012   HGB 13.0 08/30/2012   HCT 38.0 08/30/2012   PLT 190.0 08/30/2012   GLUCOSE 83 08/30/2012   CHOL 222* 08/30/2012   TRIG 93.0 08/30/2012   HDL 56.00 08/30/2012   LDLDIRECT 160.5 08/30/2012   ALT 19 08/30/2012   AST 18 08/30/2012   NA 142 08/30/2012   K 4.7 08/30/2012   CL 104 08/30/2012   CREATININE 0.9 08/30/2012   BUN 12 08/30/2012   CO2 27 08/30/2012   TSH 0.86 08/30/2012   INR 1.07 04/30/2010    Assessment/Plan: UTI, classic symptoms with history of same  Empiric antibiotic x7 days Hydration recommended education provided

## 2012-10-09 NOTE — Patient Instructions (Signed)
It was good to see you today. We have reviewed your prior records including labs and tests today Medications reviewed and updated Cipro twice a day for one week to treat bladder infection Resume Abilify, stop bupropion Will refer to new pain clinic for management of your medications. Our office will call regarding this appointment Followup in 6 weeks regarding depression, call sooner if worse or unimproved

## 2012-10-09 NOTE — Assessment & Plan Note (Signed)
symptoms exac by ongoing chronic RLQ pain - see above on effexor - tapered down 05/2011, but resumed 150mg  11/2011 due to increasing irritability Persisting depression and poor sleep - insomnia meds reviewed resume Abilify and stop Wellbutrin Denies SI/HI at this time continue prn low dose xanax  s/p gyn surgery 03/2011> reviewed same

## 2012-10-09 NOTE — Assessment & Plan Note (Signed)
Ongoing since 03/2010 with pelvic surgery and urologic injury periop status post uro eval (grapey) at time of injury - pr reports "nothing more to do per urology" then new R ovary cyst on 10/05/10 CT, not seen on  07/10/10 CT - s/p new gyn (neal) for eval same >>felt to be endometriosis contributing to current pain symptoms  s/p eval at [facility] gyn> s/p BSO 03/2011 due to pain symptoms  Started gabapentin for neuropathic component of pain in R pelvic region 09/2010 - improved but not resolved  On dilaudid Prev managed by Dr. Edwena Felty Hedgecock for same (known to pt though his care of her mom for RSD), but dismissed Refer to new pain clinic now

## 2012-10-10 ENCOUNTER — Telehealth: Payer: Self-pay | Admitting: Internal Medicine

## 2012-10-10 NOTE — Telephone Encounter (Signed)
Dr Felicity Coyer a referral was out in REFERRAL TO PAIN CLINIC 625.9 (ICD-9-CM) - Pelvic pain in female 338.4 (ICD-9-CM) - Chronic pain syndrome      From: April Estill Batten    Sent: 10/10/2012   8:22 AM      To: Governor Specking  Thank you for the referral, however, we do not treat pelvic pain here in our office.  please  Advise

## 2012-10-10 NOTE — Telephone Encounter (Signed)
Please try different clinic - ?preferred

## 2012-10-14 ENCOUNTER — Other Ambulatory Visit: Payer: Self-pay | Admitting: Internal Medicine

## 2012-10-23 ENCOUNTER — Other Ambulatory Visit: Payer: Self-pay | Admitting: Internal Medicine

## 2012-10-24 MED ORDER — ALPRAZOLAM 1 MG PO TABS: 1.0000 mg | ORAL_TABLET | Freq: Two times a day (BID) | ORAL | Status: DC | PRN

## 2012-10-24 NOTE — Telephone Encounter (Signed)
[facility].../lmb 

## 2012-11-06 ENCOUNTER — Other Ambulatory Visit: Payer: Self-pay | Admitting: Internal Medicine

## 2012-11-07 ENCOUNTER — Telehealth: Payer: Self-pay | Admitting: *Deleted

## 2012-11-07 NOTE — Telephone Encounter (Signed)
I can't really speak for Dr. Felicity Coyer on this, I will have to wait until she gets back.

## 2012-11-07 NOTE — Telephone Encounter (Signed)
Pt called requesting pain management from Dr Windell Moulding until a pain clinic can be found to treat her chronic pelvic pain.  Pt advised Dr Felicity Coyer would not be in the office until Monday.  Please advise

## 2012-11-10 NOTE — Telephone Encounter (Signed)
I do not treat chronic pain -  please check with [facility] re: status of pain clinic appt as referral made 09/2012 -  if unable to be seen by any particular clinic, [facility] should continue to call all other pain clinics for an appt scheduled somewhere -  Ok to place new refer if needed Once appt is made, I can provide refills on current meds between now and appt with new pain clinic

## 2012-11-20 ENCOUNTER — Ambulatory Visit: Payer: BC Managed Care – PPO | Admitting: Internal Medicine

## 2012-11-20 DIAGNOSIS — Z0289 Encounter for other administrative examinations: Secondary | ICD-10-CM

## 2012-11-24 ENCOUNTER — Other Ambulatory Visit: Payer: Self-pay | Admitting: Internal Medicine

## 2012-11-27 ENCOUNTER — Encounter: Payer: Self-pay | Admitting: Internal Medicine

## 2012-11-27 ENCOUNTER — Ambulatory Visit (INDEPENDENT_AMBULATORY_CARE_PROVIDER_SITE_OTHER): Payer: BC Managed Care – PPO | Admitting: Internal Medicine

## 2012-11-27 VITALS — BP 120/72 | HR 78 | Temp 98.3°F | Wt 218.4 lb

## 2012-11-27 DIAGNOSIS — F3289 Other specified depressive episodes: Secondary | ICD-10-CM

## 2012-11-27 DIAGNOSIS — N949 Unspecified condition associated with female genital organs and menstrual cycle: Secondary | ICD-10-CM

## 2012-11-27 DIAGNOSIS — R102 Pelvic and perineal pain: Secondary | ICD-10-CM

## 2012-11-27 DIAGNOSIS — F329 Major depressive disorder, single episode, unspecified: Secondary | ICD-10-CM

## 2012-11-27 DIAGNOSIS — F32A Depression, unspecified: Secondary | ICD-10-CM

## 2012-11-27 MED ORDER — GABAPENTIN (ONCE-DAILY) 600 MG PO TABS: 1200.0000 mg | ORAL_TABLET | Freq: Every day | ORAL | Status: DC

## 2012-11-27 MED ORDER — HYDROMORPHONE HCL 2 MG PO TABS: 2.0000 mg | ORAL_TABLET | ORAL | Status: DC | PRN

## 2012-11-27 NOTE — Patient Instructions (Signed)
It was good to see you today. We have reviewed your prior records including labs and tests today resume extended release gabapentin and lower dose Dilaudid 3-4 tablets daily as needed for pain Prescriptions electronically submitted to your pharmacy or given to you to submit as instructed. Please take as directed and contact our office if you believe you are having problem(s) with the medication(s). we'll make referral to Psychiatry for assistance with medication management of behavioral health issues . Our office will contact you regarding appointment(s) once made. We'll continue to work on establishment with pain clinic to manage chronic pain symptoms, until that time I will manage your Dilaudid here - controlled substance contract agreement signed today Followup in 6-8 weeks to review medications and symptoms, please call sooner if worse

## 2012-11-27 NOTE — Progress Notes (Signed)
  Subjective:    Patient ID: [name] White, female    DOB: 02/24/1965, 47 y.o.   MRN: [id number]  HPI  Patient here today for follow up.  Chronic medical issues also reviewed.   Anxiety/depression - Abilify/Effexor resumed and Wellbutrin discontinued August 2014. Pt still with significant apathy and lack of desire.  Tearful today. Continued, significant insomnia even with Ambien.    Hyperlipidemia - Pravastatin started July 2014.  Pt reports compliance.  No adverse effects related to treatment.  Chronic pain - referred to pain clinic August 2014.  Referral still pending due to lack of providers to manage "pelvic pain" - dismissed from prior pain clinic summer 2014 and out of meds since then  Obesity - pt up 11 pounds from 7/14 to 9/14.   Past Medical History  Diagnosis Date  . Depression   . Barrett's esophagus     egd 2007 + 2009 but none 2011 or 08/2010 egd  . Colon polyp     hyperplastic  . Grief reaction   . Chronic headache   . Tobacco abuse   . Migraine   . Restless leg   . IBS (irritable bowel syndrome)   . Hidradenitis suppurativa   . Ovarian cyst   . OSA (obstructive sleep apnea)     Review of Systems  Constitutional: Negative for fever and chills.  Respiratory: Positive for shortness of breath (dyspnea on exertion). Negative for chest tightness.   Cardiovascular: Negative for chest pain and palpitations.  Gastrointestinal: Negative for nausea, vomiting, diarrhea and constipation.  Neurological: Negative for dizziness, syncope and headaches.  Psychiatric/Behavioral: Positive for sleep disturbance and dysphoric mood.       Objective:   Physical Exam  Vitals reviewed. Constitutional: She is oriented to person, place, and time. She appears well-developed and well-nourished. No distress.  HENT:  Head: Normocephalic and atraumatic.  Neck: Normal range of motion. Neck supple. No thyromegaly present.  Cardiovascular: Normal rate and regular rhythm.   No murmur  heard. Pulmonary/Chest: Effort normal and breath sounds normal. She has no wheezes.  Abdominal: Soft. Bowel sounds are normal. She exhibits no distension. There is no tenderness.  Lymphadenopathy:    She has no cervical adenopathy.  Neurological: She is alert and oriented to person, place, and time.  Skin: Skin is warm and dry. No rash noted.  Psychiatric:  Tearful and dysphoric today     Wt Readings from Last 3 Encounters:  11/27/12 218 lb 6.4 oz (99.066 kg)  10/09/12 214 lb 6.4 oz (97.251 kg)  08/30/12 207 lb (93.895 kg)   BP Readings from Last 3 Encounters:  11/27/12 120/72  10/09/12 110/68  08/30/12 118/82   Lab Results  Component Value Date   WBC 7.7 08/30/2012   HGB 13.0 08/30/2012   HCT 38.0 08/30/2012   PLT 190.0 08/30/2012   GLUCOSE 83 08/30/2012   CHOL 222* 08/30/2012   TRIG 93.0 08/30/2012   HDL 56.00 08/30/2012   LDLDIRECT 160.5 08/30/2012   ALT 19 08/30/2012   AST 18 08/30/2012   NA 142 08/30/2012   K 4.7 08/30/2012   CL 104 08/30/2012   CREATININE 0.9 08/30/2012   BUN 12 08/30/2012   CO2 27 08/30/2012   TSH 0.86 08/30/2012   INR 1.07 04/30/2010      Assessment & Plan:  See problem list

## 2012-11-27 NOTE — Assessment & Plan Note (Addendum)
Ongoing since 03/2010 with pelvic surgery and urologic injury periop status post uro eval (grapey) at time of injury - pr reports "nothing more to do per urology" then new R ovary cyst on 10/05/10 CT, not seen on  07/10/10 CT - s/p new gyn (neal) for eval same >>felt to be endometriosis contributing to current pain symptoms  s/p eval at [facility] gyn> s/p BSO 03/2011 due to pain symptoms  Started gabapentin for neuropathic component of pain in R pelvic region 09/2010 - improved but not resolved  On dilaudid through pain clinic managed by Dr. Elige Ko for same (known to pt though his care of her mom for RSD), but dismissed summer 2014 when [facility] left practice Pain clinic referral still pending (no one taking pelvic pain per report to me) Will resume lower dose Dilaudid now pending establishment with pain clinic Also resume XR gabapentin (Gralise)

## 2012-11-27 NOTE — Assessment & Plan Note (Addendum)
on effexor - tapered down 05/2011, but resumed 150mg  11/2011 due to increasing irritability Persisting depression and poor sleep - insomnia meds reviewed Abilify resumed and stopped Wellbutrin 09/2012 - improved but not at goal Refer to psyc for assist in med mgmt now Denies SI/HI at this time continue prn low dose xanax

## 2012-12-28 ENCOUNTER — Telehealth: Payer: Self-pay | Admitting: *Deleted

## 2012-12-28 NOTE — Telephone Encounter (Signed)
Pt called requesting Hydromorphone refill.  Please advise

## 2012-12-29 MED ORDER — HYDROMORPHONE HCL 2 MG PO TABS: 2.0000 mg | ORAL_TABLET | ORAL | Status: DC | PRN

## 2012-12-29 NOTE — Telephone Encounter (Signed)
Left message to return call 

## 2012-12-29 NOTE — Telephone Encounter (Signed)
ok 

## 2012-12-29 NOTE — Telephone Encounter (Signed)
Spoke with pt advised Rx ready for pick up 

## 2013-01-29 ENCOUNTER — Other Ambulatory Visit: Payer: Self-pay | Admitting: Internal Medicine

## 2013-01-29 ENCOUNTER — Encounter: Payer: Self-pay | Admitting: Internal Medicine

## 2013-01-29 ENCOUNTER — Ambulatory Visit (INDEPENDENT_AMBULATORY_CARE_PROVIDER_SITE_OTHER)
Admission: RE | Admit: 2013-01-29 | Discharge: 2013-01-29 | Disposition: A | Discharge status: Home / Self Care | Payer: BC Managed Care – PPO | Source: Ambulatory Visit | Attending: Internal Medicine | Admitting: Internal Medicine

## 2013-01-29 ENCOUNTER — Ambulatory Visit (INDEPENDENT_AMBULATORY_CARE_PROVIDER_SITE_OTHER): Payer: BC Managed Care – PPO | Admitting: Internal Medicine

## 2013-01-29 VITALS — BP 138/76 | HR 103 | Temp 97.7°F | Resp 18 | Wt 229.0 lb

## 2013-01-29 DIAGNOSIS — N949 Unspecified condition associated with female genital organs and menstrual cycle: Secondary | ICD-10-CM

## 2013-01-29 DIAGNOSIS — G894 Chronic pain syndrome: Secondary | ICD-10-CM

## 2013-01-29 DIAGNOSIS — F32A Depression, unspecified: Secondary | ICD-10-CM

## 2013-01-29 DIAGNOSIS — F329 Major depressive disorder, single episode, unspecified: Secondary | ICD-10-CM

## 2013-01-29 DIAGNOSIS — R102 Pelvic and perineal pain: Secondary | ICD-10-CM

## 2013-01-29 DIAGNOSIS — M533 Sacrococcygeal disorders, not elsewhere classified: Secondary | ICD-10-CM

## 2013-01-29 MED ORDER — HYDROMORPHONE HCL 2 MG PO TABS: 2.0000 mg | ORAL_TABLET | Freq: Three times a day (TID) | ORAL | Status: DC | PRN

## 2013-01-29 NOTE — Patient Instructions (Addendum)
It was good to see you today.  We have reviewed your prior records including labs and tests today  xray ordered today. Your results will be released to MyChart (or called to you) after review, usually within 72hours after test completion. If any changes need to be made, you will be notified at that same time.  lower dose Dilaudid to 3 tablets daily as needed for pain - no other changes  Prescriptions electronically submitted to your pharmacy or given to you to submit as instructed. Please take as directed and contact our office if you believe you are having problem(s) with the medication(s).  Continue working with Psychiatry for assistance with medication management of behavioral health issues . Our office will contact you regarding appointment(s) once made.  We'll continue to work on establishment with pain clinic to manage chronic pain symptoms, until that time I will manage your Dilaudid here - controlled substance contract agreement signed today  Followup in 3-4 months to review medications and symptoms, please call sooner if worse

## 2013-01-29 NOTE — Assessment & Plan Note (Signed)
on effexor - tapered down 05/2011, but resumed 150mg  11/2011 due to increasing irritability Persisting depression and poor sleep - insomnia meds reviewed Abilify resumed and stopped Wellbutrin 09/2012 - improved but not at goal reestablished with pscy Nolen Mu) Denies SI/HI at this time continue prn low dose xanax

## 2013-01-29 NOTE — Progress Notes (Signed)
Pre-visit discussion using our clinic review tool. No additional management support is needed unless otherwise documented below in the visit note.  

## 2013-01-29 NOTE — Progress Notes (Signed)
Subjective:    Patient ID: [name] White, female    DOB: [birth year], 47 y.o.   MRN: [id number]  HPI  Patient here today for follow up.  Chronic medical issues reviewed.   Anxiety/depression - chronic but stable. Abilify/Effexor resumed and Wellbutrin discontinued August 2014. Pt still with significant apathy and lack of desire. significant insomnia even with Ambien.    Hyperlipidemia - Pravastatin started July 2014.  Pt reports compliance.  No adverse effects related to treatment.  Chronic pain - referred to pain clinic August 2014.  Referral still pending due to lack of providers to manage "pelvic pain" - dismissed from prior pain clinic summer 2014 and out of meds since then   Past Medical History  Diagnosis Date  . Depression   . Barrett's esophagus     egd 2007 + 2009 but none 2011 or 08/2010 egd  . Colon polyp     hyperplastic  . Grief reaction   . Chronic headache   . Tobacco abuse   . Migraine   . Restless leg   . IBS (irritable bowel syndrome)   . Hidradenitis suppurativa   . Ovarian cyst   . OSA (obstructive sleep apnea)     Review of Systems  Constitutional: Negative for fever and chills.  Respiratory: Negative for cough and shortness of breath.   Cardiovascular: Negative for chest pain and palpitations.  Gastrointestinal: Negative for nausea, vomiting, diarrhea and constipation.  Neurological: Negative for dizziness, syncope and headaches.  Psychiatric/Behavioral: Positive for sleep disturbance and dysphoric mood.       Objective:   Physical Exam  Vitals reviewed. Constitutional: She is oriented to person, place, and time. She appears well-developed and well-nourished. No distress.  HENT:  Head: Normocephalic and atraumatic.  Neck: Normal range of motion. Neck supple. No thyromegaly present.  Cardiovascular: Normal rate and regular rhythm.   No murmur heard. Pulmonary/Chest: Effort normal and breath sounds normal. She has no wheezes.  Abdominal: Soft.  Bowel sounds are normal. She exhibits no distension. There is no tenderness.  Lymphadenopathy:    She has no cervical adenopathy.  Neurological: She is alert and oriented to person, place, and time.  Skin: Skin is warm and dry. No rash noted.  Resolving bruise at top of gluteal fold, tender to palp  Psychiatric: She has a normal mood and affect. Judgment and thought content normal.     Wt Readings from Last 3 Encounters:  01/29/13 229 lb (103.874 kg)  11/27/12 218 lb 6.4 oz (99.066 kg)  10/09/12 214 lb 6.4 oz (97.251 kg)   BP Readings from Last 3 Encounters:  01/29/13 138/76  11/27/12 120/72  10/09/12 110/68   Lab Results  Component Value Date   WBC 7.7 08/30/2012   HGB 13.0 08/30/2012   HCT 38.0 08/30/2012   PLT 190.0 08/30/2012   GLUCOSE 83 08/30/2012   CHOL 222* 08/30/2012   TRIG 93.0 08/30/2012   HDL 56.00 08/30/2012   LDLDIRECT 160.5 08/30/2012   ALT 19 08/30/2012   AST 18 08/30/2012   NA 142 08/30/2012   K 4.7 08/30/2012   CL 104 08/30/2012   CREATININE 0.9 08/30/2012   BUN 12 08/30/2012   CO2 27 08/30/2012   TSH 0.86 08/30/2012   INR 1.07 04/30/2010      Assessment & Plan:  See problem list  Sacral pain s/p fall 2 weeks ago - down flight of steps, direct trauma and bruising to sacrum - check DG r/o sacral or pelvic fx -  continue pain mgmt as ongoing - donut pillow rx provided

## 2013-01-29 NOTE — Assessment & Plan Note (Signed)
Ongoing since 03/2010 with pelvic surgery and urologic injury periop status post uro eval (grapey) at time of injury - pr reports "nothing more to do per urology" then new R ovary cyst on 10/05/10 CT, not seen on  07/10/10 CT - s/p new gyn (neal) for eval same >>felt to be endometriosis contributing to current pain symptoms  s/p eval at [facility] gyn> s/p BSO 03/2011 due to pain symptoms  Started gabapentin for neuropathic component of pain in R pelvic region 09/2010 - improved but not resolved  On dilaudid through pain clinic managed by Dr. Elige Ko for same (known to pt though his care of her mom for RSD), but dismissed summer 2014 when [facility] left practice Pain clinic referral still pending (no one taking pelvic pain per report to me) Will resume lower dose Dilaudid 11/2012 pending establishment with pain clinic continue weaning down now (q8h, not q4h - #90, not #120/mo) provided Also resumed XR gabapentin (Gralise) 11/2012

## 2013-01-29 NOTE — Telephone Encounter (Signed)
Ok to refill? Last OV 12.8.14 Last filled 9.2.14

## 2013-02-28 ENCOUNTER — Telehealth: Payer: Self-pay | Admitting: *Deleted

## 2013-02-28 MED ORDER — HYDROMORPHONE HCL 2 MG PO TABS: 2.0000 mg | ORAL_TABLET | Freq: Three times a day (TID) | ORAL | Status: DC | PRN

## 2013-02-28 NOTE — Telephone Encounter (Signed)
Patient called requesting refill for her Dilaudid.  Per pain contract & protocol, script printed & awaiting MD signature & pt pick up. Pt notified script will be available for pickup.

## 2013-03-30 ENCOUNTER — Telehealth: Payer: Self-pay

## 2013-03-30 MED ORDER — HYDROMORPHONE HCL 2 MG PO TABS: 2.0000 mg | ORAL_TABLET | Freq: Three times a day (TID) | ORAL | Status: DC | PRN

## 2013-03-30 NOTE — Telephone Encounter (Signed)
Patient called and is hoping to get a refill on her dilaudid   [phone]

## 2013-03-30 NOTE — Telephone Encounter (Signed)
Prescription printed and signed. Please notify the patient will enroll in our assured toxicology program

## 2013-04-02 NOTE — Telephone Encounter (Signed)
Called pt no answer LMOM with md response.../lmb 

## 2013-04-10 ENCOUNTER — Other Ambulatory Visit: Payer: Self-pay | Admitting: Pulmonary Disease

## 2013-05-08 ENCOUNTER — Other Ambulatory Visit: Payer: Self-pay | Admitting: Internal Medicine

## 2013-05-08 NOTE — Telephone Encounter (Signed)
MD out of office. Pls advise on refill.../lmb 

## 2013-05-08 NOTE — Telephone Encounter (Signed)
Faxed hardcopy to [facility] [place] 

## 2013-05-08 NOTE — Telephone Encounter (Signed)
Done hardcopy to [facility]bin  

## 2013-05-15 ENCOUNTER — Encounter: Payer: Self-pay | Admitting: Internal Medicine

## 2013-05-30 ENCOUNTER — Ambulatory Visit: Payer: BC Managed Care – PPO | Admitting: Internal Medicine

## 2013-06-06 ENCOUNTER — Telehealth: Payer: Self-pay | Admitting: Internal Medicine

## 2013-06-06 ENCOUNTER — Ambulatory Visit (INDEPENDENT_AMBULATORY_CARE_PROVIDER_SITE_OTHER): Payer: BC Managed Care – PPO | Admitting: Internal Medicine

## 2013-06-06 ENCOUNTER — Encounter: Payer: Self-pay | Admitting: Internal Medicine

## 2013-06-06 VITALS — BP 110/72 | HR 73 | Temp 98.0°F | Wt 239.4 lb

## 2013-06-06 DIAGNOSIS — R102 Pelvic and perineal pain: Secondary | ICD-10-CM

## 2013-06-06 DIAGNOSIS — F3289 Other specified depressive episodes: Secondary | ICD-10-CM

## 2013-06-06 DIAGNOSIS — N949 Unspecified condition associated with female genital organs and menstrual cycle: Secondary | ICD-10-CM

## 2013-06-06 DIAGNOSIS — F329 Major depressive disorder, single episode, unspecified: Secondary | ICD-10-CM

## 2013-06-06 DIAGNOSIS — F32A Depression, unspecified: Secondary | ICD-10-CM

## 2013-06-06 MED ORDER — HYDROMORPHONE HCL 2 MG PO TABS: 2.0000 mg | ORAL_TABLET | Freq: Three times a day (TID) | ORAL | Status: DC | PRN

## 2013-06-06 MED ORDER — GABAPENTIN 600 MG PO TABS: 1200.0000 mg | ORAL_TABLET | Freq: Three times a day (TID) | ORAL | Status: DC

## 2013-06-06 NOTE — Progress Notes (Signed)
Pre visit review using our clinic review tool, if applicable. No additional management support is needed unless otherwise documented below in the visit note. 

## 2013-06-06 NOTE — Assessment & Plan Note (Signed)
Ongoing since 03/2010 with pelvic surgery and urologic injury periop status post uro eval (grapey) at time of injury - pr reports "nothing more to do per urology" then new R ovary cyst on 10/05/10 CT, not seen on  07/10/10 CT - s/p new gyn (neal) for eval same >>felt to be endometriosis contributing to current pain symptoms  s/p eval at [facility] gyn> s/p BSO 03/2011 due to pain symptoms  Started gabapentin for neuropathic component of pain in R pelvic region 09/2010 - improved but not resolved  Started on dilaudid through pain clinic managed by Dr. 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

## 2013-06-06 NOTE — Patient Instructions (Signed)
It was good to see you today.  We have reviewed your prior records including labs and tests today  continue Dilaudid 3 tablets daily for pain - Change gabapentin to short acting, generic - take 1-2 three times daily no other changes  Prescriptions electronically submitted to your pharmacy or given to you to submit as instructed. Please take as directed and contact our office if you believe you are having problem(s) with the medication(s).  Will check urine drug screen before next prescription pick up  work on follow up with [facility] for your pelvic pain evaluation - talk with Dr Nori Riis for referral if needed  Followup in 3- months to review medications and symptoms, please call sooner if worse

## 2013-06-06 NOTE — Progress Notes (Signed)
Subjective:    Patient ID: [name] White, female    DOB: 23-Aug-1965, 48 y.o.   MRN: [id number]  HPI  Patient is here for follow up - Reviewed chronic medical issues and interval medical events  Past Medical History  Diagnosis Date  . Depression   . Barrett's esophagus     egd 2007 + 2009 but none 2011 or 08/2010 egd  . Colon polyp     hyperplastic  . Grief reaction   . Chronic headache   . Tobacco abuse   . Migraine   . Restless leg   . IBS (irritable bowel syndrome)   . Hidradenitis suppurativa   . Ovarian cyst   . OSA (obstructive sleep apnea)     Review of Systems  Constitutional: Positive for fatigue (chronic) and unexpected weight change. Negative for fever.  Respiratory: Negative for cough and shortness of breath.   Genitourinary: Positive for pelvic pain (chronic). Negative for urgency, flank pain, decreased urine volume, vaginal bleeding and vaginal discharge.  Psychiatric/Behavioral: Positive for sleep disturbance, dysphoric mood and decreased concentration. Negative for suicidal ideas and self-injury. The patient is nervous/anxious.        Objective:   Physical Exam  BP 110/72  Pulse 73  Temp(Src) 98 F (36.7 C) (Oral)  Wt 239 lb 6.4 oz (108.591 kg)  SpO2 98% Wt Readings from Last 3 Encounters:  06/06/13 239 lb 6.4 oz (108.591 kg)  01/29/13 229 lb (103.874 kg)  11/27/12 218 lb 6.4 oz (99.066 kg)   Constitutional: She is obese, fatigued -appears otherwise well-developed and well-nourished. No acute distress.  Neck: Normal range of motion. Neck supple. No JVD present. No thyromegaly present.  Cardiovascular: Normal rate, regular rhythm and normal heart sounds.  No murmur heard. No BLE edema. Pulmonary/Chest: Effort normal and breath sounds normal. No respiratory distress. She has no wheezes.  Psychiatric: She has a  dysphoric mood and affect. Her behavior is normal. Judgment and thought content normal.   Lab Results  Component Value Date   WBC 7.7  08/30/2012   HGB 13.0 08/30/2012   HCT 38.0 08/30/2012   PLT 190.0 08/30/2012   GLUCOSE 83 08/30/2012   CHOL 222* 08/30/2012   TRIG 93.0 08/30/2012   HDL 56.00 08/30/2012   LDLDIRECT 160.5 08/30/2012   ALT 19 08/30/2012   AST 18 08/30/2012   NA 142 08/30/2012   K 4.7 08/30/2012   CL 104 08/30/2012   CREATININE 0.9 08/30/2012   BUN 12 08/30/2012   CO2 27 08/30/2012   TSH 0.86 08/30/2012   INR 1.07 04/30/2010    Dg Sacrum/coccyx  01/29/2013   CLINICAL DATA:  [place] several days ago. Sacral coccygeal region pain.  EXAM: SACRUM AND COCCYX - 2+ VIEW  COMPARISON:  None.  FINDINGS: There is malalignment and fragmentation of the coccyx, which is consistent with a fracture, only well seen on the lateral view. Although this portion of the coccyx was angulated on a prior CT of the abdomen and pelvis, on the sagittal reconstructed sequence, the malalignment and fragmentation are new supporting a recent fracture.  No other evidence of a fracture. The SI joints are normally space and aligned.  IMPRESSION: Findings are consistent with a coccygeal fracture with mild anterior displacement.   Electronically Signed   By: Lajean Manes M.D.   On: 01/29/2013 15:28       Assessment & Plan:   Problem List Items Addressed This Visit   Depression     on effexor -  tapered down 05/2011, but resumed 150mg  11/2011 due to increasing irritability Persisting depression and poor sleep - insomnia meds reviewed Abilify temporarily resumed and stopped Wellbutrin 09/2012 - initially improved but not at goal Briefly reestablished with pscy Caprice Beaver), but no longer following for same -and does not wish to resume abilify at this time Denies SI/HI at this time continue prn low dose xanax    Pelvic pain in female - Primary     Ongoing since 03/2010 with pelvic surgery and urologic injury periop status post uro eval (grapey) at time of injury - pr reports "nothing more to do per urology" then new R ovary cyst on 10/05/10 CT, not seen on  07/10/10 CT - s/p new  gyn (neal) for eval same >>felt to be endometriosis contributing to current pain symptoms  s/p eval at [facility] gyn> s/p BSO 03/2011 due to pain symptoms  Started gabapentin for neuropathic component of pain in R pelvic region 09/2010 - improved but not resolved  Started on dilaudid through pain clinic managed by Dr. Thomasenia Bottoms for same (known to pt though his care of her mom for RSD), but dismissed summer 2014 when [facility] left practice Pain clinic referral still pending (no one taking "pelvic pain" per pt report, no uninsured with unemployment) resumed lower dose Dilaudid 11/2012 pending establishment with pain clinic Wean down on dose 01/2013 (q8h, not q4h - #90, not #120/mo) \no further wean at this time change XR gabapentin (Gralise) to generic gabapentin and increase dose Advised follow up at [facility] or pain clinic if feels pain otherwise uncontrolled

## 2013-06-06 NOTE — Assessment & Plan Note (Signed)
on effexor - tapered down 05/2011, but resumed 150mg  11/2011 due to increasing irritability Persisting depression and poor sleep - insomnia meds reviewed Abilify temporarily resumed and stopped Wellbutrin 09/2012 - initially improved but not at goal Briefly reestablished with pscy Caprice Beaver), but no longer following for same -and does not wish to resume abilify at this time Denies SI/HI at this time continue prn low dose xanax

## 2013-06-06 NOTE — Telephone Encounter (Signed)
Relevant patient education mailed to patient.  

## 2013-06-11 ENCOUNTER — Encounter: Payer: Self-pay | Admitting: Internal Medicine

## 2013-06-30 ENCOUNTER — Other Ambulatory Visit: Payer: Self-pay | Admitting: Internal Medicine

## 2013-07-05 ENCOUNTER — Telehealth: Payer: Self-pay | Admitting: Internal Medicine

## 2013-07-05 MED ORDER — HYDROMORPHONE HCL 2 MG PO TABS: 2.0000 mg | ORAL_TABLET | Freq: Three times a day (TID) | ORAL | Status: DC | PRN

## 2013-07-05 NOTE — Telephone Encounter (Signed)
Done hardcopy to [facility]bin  

## 2013-07-05 NOTE — Telephone Encounter (Signed)
Patient requests refill of Dilaudid.

## 2013-07-05 NOTE — Telephone Encounter (Signed)
Called informed the patient hardcopy is ready for pickup at the front desk. 

## 2013-08-13 ENCOUNTER — Telehealth: Payer: Self-pay | Admitting: *Deleted

## 2013-08-13 NOTE — Telephone Encounter (Signed)
Per Dr Asa Lente - the pt was supposed to est w/Pain Clinic Thx

## 2013-08-13 NOTE — Telephone Encounter (Signed)
MD out of office. Last seen 05/2013 with Dr Asa Lente Last filled 07/05/13 #90

## 2013-08-13 NOTE — Telephone Encounter (Signed)
Left msg on triage requesting refill on her dilaudid. Md is out of office this week. pls advise on refill...Johny Chess

## 2013-08-13 NOTE — Telephone Encounter (Signed)
OK #30 

## 2013-08-14 MED ORDER — HYDROMORPHONE HCL 2 MG PO TABS: 2.0000 mg | ORAL_TABLET | Freq: Three times a day (TID) | ORAL | Status: DC | PRN

## 2013-08-14 NOTE — Telephone Encounter (Signed)
Notified pt rx ready for pick-up.../lmb 

## 2013-08-14 NOTE — Telephone Encounter (Signed)
Printed rx md sign. Notified pt rx ready for pick-up...lmb

## 2013-08-16 ENCOUNTER — Other Ambulatory Visit: Payer: Self-pay

## 2013-08-16 ENCOUNTER — Other Ambulatory Visit: Payer: Self-pay | Admitting: Internal Medicine

## 2013-08-16 MED ORDER — ALPRAZOLAM 1 MG PO TABS: ORAL_TABLET | ORAL | Status: DC

## 2013-08-16 NOTE — Telephone Encounter (Signed)
Done hardcopy to [facility]bin  

## 2013-08-16 NOTE — Telephone Encounter (Signed)
Faxed hardcopy to [facility]

## 2013-09-05 ENCOUNTER — Encounter: Payer: BC Managed Care – PPO | Admitting: Internal Medicine

## 2013-09-11 ENCOUNTER — Telehealth: Payer: Self-pay | Admitting: Internal Medicine

## 2013-09-11 NOTE — Telephone Encounter (Signed)
Patient called requesting rx for hydromorphone. Call [phone] when ready for pick up.

## 2013-09-12 ENCOUNTER — Ambulatory Visit: Payer: Self-pay | Admitting: Internal Medicine

## 2013-09-12 MED ORDER — HYDROMORPHONE HCL 2 MG PO TABS: 2.0000 mg | ORAL_TABLET | Freq: Three times a day (TID) | ORAL | Status: DC | PRN

## 2013-09-12 NOTE — Telephone Encounter (Signed)
Refill done.  

## 2013-09-12 NOTE — Telephone Encounter (Signed)
Called pt and informed rx is ready to pick up.

## 2013-09-24 ENCOUNTER — Emergency Department (HOSPITAL_COMMUNITY): Payer: Self-pay

## 2013-09-24 ENCOUNTER — Observation Stay (HOSPITAL_COMMUNITY): Payer: Self-pay

## 2013-09-24 ENCOUNTER — Encounter (HOSPITAL_COMMUNITY): Payer: Self-pay | Admitting: Emergency Medicine

## 2013-09-24 ENCOUNTER — Observation Stay (HOSPITAL_COMMUNITY)
Admission: EM | Admit: 2013-09-24 | Discharge: 2013-09-25 | Disposition: A | Discharge status: Home / Self Care | Payer: Self-pay | Attending: Internal Medicine | Admitting: Internal Medicine

## 2013-09-24 DIAGNOSIS — R0789 Other chest pain: Principal | ICD-10-CM

## 2013-09-24 DIAGNOSIS — G4733 Obstructive sleep apnea (adult) (pediatric): Secondary | ICD-10-CM | POA: Insufficient documentation

## 2013-09-24 DIAGNOSIS — F17200 Nicotine dependence, unspecified, uncomplicated: Secondary | ICD-10-CM | POA: Insufficient documentation

## 2013-09-24 DIAGNOSIS — E669 Obesity, unspecified: Secondary | ICD-10-CM

## 2013-09-24 DIAGNOSIS — F3289 Other specified depressive episodes: Secondary | ICD-10-CM | POA: Insufficient documentation

## 2013-09-24 DIAGNOSIS — F329 Major depressive disorder, single episode, unspecified: Secondary | ICD-10-CM | POA: Diagnosis present

## 2013-09-24 DIAGNOSIS — G2581 Restless legs syndrome: Secondary | ICD-10-CM

## 2013-09-24 DIAGNOSIS — Z79899 Other long term (current) drug therapy: Secondary | ICD-10-CM | POA: Insufficient documentation

## 2013-09-24 DIAGNOSIS — F32A Depression, unspecified: Secondary | ICD-10-CM | POA: Diagnosis present

## 2013-09-24 DIAGNOSIS — Z8249 Family history of ischemic heart disease and other diseases of the circulatory system: Secondary | ICD-10-CM | POA: Insufficient documentation

## 2013-09-24 DIAGNOSIS — K2270 Barrett's esophagus without dysplasia: Secondary | ICD-10-CM

## 2013-09-24 DIAGNOSIS — Z9089 Acquired absence of other organs: Secondary | ICD-10-CM | POA: Insufficient documentation

## 2013-09-24 DIAGNOSIS — Z6839 Body mass index (BMI) 39.0-39.9, adult: Secondary | ICD-10-CM | POA: Insufficient documentation

## 2013-09-24 LAB — CBC
HCT: 41.1 % (ref 36.0–46.0)
HEMOGLOBIN: 13.4 g/dL (ref 12.0–15.0)
MCH: 30.7 pg (ref 26.0–34.0)
MCHC: 32.6 g/dL (ref 30.0–36.0)
MCV: 94.1 fL (ref 78.0–100.0)
Platelets: 204 10*3/uL (ref 150–400)
RBC: 4.37 MIL/uL (ref 3.87–5.11)
RDW: 13 % (ref 11.5–15.5)
WBC: 6.4 10*3/uL (ref 4.0–10.5)

## 2013-09-24 LAB — D-DIMER, QUANTITATIVE (NOT AT ARMC): D DIMER QUANT: 0.91 ug{FEU}/mL — AB (ref 0.00–0.48)

## 2013-09-24 LAB — BASIC METABOLIC PANEL
Anion gap: 11 (ref 5–15)
BUN: 12 mg/dL (ref 6–23)
CALCIUM: 9.6 mg/dL (ref 8.4–10.5)
CO2: 27 meq/L (ref 19–32)
CREATININE: 1.1 mg/dL (ref 0.50–1.10)
Chloride: 100 mEq/L (ref 96–112)
GFR calc Af Amer: 68 mL/min — ABNORMAL LOW (ref >90)
GFR calc non Af Amer: 58 mL/min — ABNORMAL LOW (ref >90)
GLUCOSE: 121 mg/dL — AB (ref 70–99)
Potassium: 3.8 mEq/L (ref 3.7–5.3)
Sodium: 138 mEq/L (ref 137–147)

## 2013-09-24 LAB — PRO B NATRIURETIC PEPTIDE: Pro B Natriuretic peptide (BNP): 39.1 pg/mL (ref 0–125)

## 2013-09-24 LAB — I-STAT TROPONIN, ED: Troponin i, poc: 0 ng/mL (ref 0.00–0.08)

## 2013-09-24 LAB — TROPONIN I: Troponin I: <0.3 ng/mL (ref <0.30)

## 2013-09-24 MED ORDER — ASPIRIN 81 MG PO CHEW
324.0000 mg | CHEWABLE_TABLET | Freq: Once | ORAL | Status: AC
  Administered 2013-09-24: 324 mg via ORAL
  Filled 2013-09-24: qty 4

## 2013-09-24 MED ORDER — ROPINIROLE HCL 1 MG PO TABS
2.0000 mg | ORAL_TABLET | Freq: Every day | ORAL | Status: DC
  Administered 2013-09-24: 2 mg via ORAL
  Filled 2013-09-24 (×2): qty 2

## 2013-09-24 MED ORDER — ENOXAPARIN SODIUM 60 MG/0.6ML ~~LOC~~ SOLN
50.0000 mg | Freq: Every day | SUBCUTANEOUS | Status: DC
Start: 2013-09-24 — End: 2013-09-25
  Administered 2013-09-24: 50 mg via SUBCUTANEOUS
  Filled 2013-09-24 (×2): qty 0.6

## 2013-09-24 MED ORDER — ALPRAZOLAM 1 MG PO TABS
1.0000 mg | ORAL_TABLET | Freq: Two times a day (BID) | ORAL | Status: DC | PRN
  Administered 2013-09-25: 1 mg via ORAL
  Filled 2013-09-24: qty 1

## 2013-09-24 MED ORDER — ACETAMINOPHEN 325 MG PO TABS: 650.0000 mg | ORAL_TABLET | Freq: Four times a day (QID) | ORAL | Status: DC | PRN

## 2013-09-24 MED ORDER — ONDANSETRON HCL 4 MG PO TABS: 4.0000 mg | ORAL_TABLET | Freq: Four times a day (QID) | ORAL | Status: DC | PRN

## 2013-09-24 MED ORDER — HYDROMORPHONE HCL PF 1 MG/ML IJ SOLN
1.0000 mg | INTRAMUSCULAR | Status: DC | PRN
  Administered 2013-09-24 – 2013-09-25 (×2): 1 mg via INTRAVENOUS
  Filled 2013-09-24 (×2): qty 1

## 2013-09-24 MED ORDER — ONDANSETRON HCL 4 MG/2ML IJ SOLN
4.0000 mg | Freq: Three times a day (TID) | INTRAMUSCULAR | Status: AC | PRN
  Filled 2013-09-24: qty 2

## 2013-09-24 MED ORDER — NITROGLYCERIN 0.4 MG SL SUBL: 0.4000 mg | SUBLINGUAL_TABLET | SUBLINGUAL | Status: DC | PRN

## 2013-09-24 MED ORDER — HYDROMORPHONE HCL 2 MG PO TABS: 2.0000 mg | ORAL_TABLET | Freq: Three times a day (TID) | ORAL | Status: DC | PRN

## 2013-09-24 MED ORDER — ONDANSETRON HCL 4 MG/2ML IJ SOLN
4.0000 mg | Freq: Four times a day (QID) | INTRAMUSCULAR | Status: DC | PRN
  Administered 2013-09-24 – 2013-09-25 (×3): 4 mg via INTRAVENOUS
  Filled 2013-09-24 (×2): qty 2

## 2013-09-24 MED ORDER — ENOXAPARIN SODIUM 40 MG/0.4ML ~~LOC~~ SOLN: 40.0000 mg | SUBCUTANEOUS | Status: DC

## 2013-09-24 MED ORDER — KETOROLAC TROMETHAMINE 15 MG/ML IJ SOLN
15.0000 mg | Freq: Four times a day (QID) | INTRAMUSCULAR | Status: DC | PRN
  Administered 2013-09-25: 15 mg via INTRAVENOUS
  Filled 2013-09-24: qty 1

## 2013-09-24 MED ORDER — ACETAMINOPHEN 650 MG RE SUPP: 650.0000 mg | Freq: Four times a day (QID) | RECTAL | Status: DC | PRN

## 2013-09-24 MED ORDER — GABAPENTIN 400 MG PO CAPS
1200.0000 mg | ORAL_CAPSULE | Freq: Three times a day (TID) | ORAL | Status: DC
  Administered 2013-09-24 – 2013-09-25 (×3): 1200 mg via ORAL
  Filled 2013-09-24 (×4): qty 3

## 2013-09-24 NOTE — Progress Notes (Signed)
Pt arrived from ED and ambulated to bed. Pt c/o squeezing pain in chest at this time, VSS. Will implement MD orders and will continue to monitor pt closely. Carnella Guadalajara I

## 2013-09-24 NOTE — H&P (Signed)
[facility] History and Physical  [name] White GBT:[id number] DOB: 31-Mar-1965 DOA: 09/24/2013  Referring physician: EDP PCP: Gwendolyn Grant, MD   Chief Complaint: substernal chest pain since last night.   HPI: [name] White is a 48 y.o. female with h/o with depression, IBS, anxiety, came in for substernal chest pain, started 3 weeks ago, worsened last night associated with nausea, diaphoresis, and shortness of breath.  She reports  Chest pain occurs both at rest and on activity, worsens on deep breathing, resolves by itself. She denies any other complaints. Her initial troponin is negative. She reports family h/o blood clots in her mother. She is referred to medical service for evaluation of ACS.    Review of Systems:  Constitutional:  No weight loss, night sweats, Fevers, chills, fatigue.  HEENT:  No headaches, Difficulty swallowing,Tooth/dental problems,Sore throat,  No sneezing, itching, ear ache, nasal congestion, post nasal drip,  Cardio-vascular:  Chest pain substernal and left sided. Associated with nausea and diaphoresis.  GI:  No heartburn, indigestion, abdominal pain, na vomiting, diarrhea, change in bowel habits, loss of appetite  Resp:  No shortness of breath with exertion or at rest. No excess mucus, no productive cough, No non-productive cough, No coughing up of blood.No change in color of mucus.No wheezing.No chest wall deformity  Skin:  no rash or lesions.  GU:  no dysuria, change in color of urine, no urgency or frequency. No flank pain.  Musculoskeletal:  Right Lower extremity neuropathy.    Past Medical History  Diagnosis Date  . Depression   . Barrett's esophagus     egd 2007 + 2009 but none 2011 or 08/2010 egd  . Colon polyp     hyperplastic  . Grief reaction   . Chronic headache   . Tobacco abuse   . Migraine   . Restless leg   . IBS (irritable bowel syndrome)   . Hidradenitis suppurativa   . Ovarian cyst   . OSA (obstructive sleep apnea)     Past Surgical History  Procedure Laterality Date  . Wrist surgery      right  . Cesarean section      x 2  . Cholecystectomy    . Tubal ligation    . Plantar fascitis      right foot  . Abdominal hysterectomy      secondary to endometriosis  . Laparoscopy  04-22-10    lysis of adhesions  . Elbow surgery      left  . Salpingectomy  04-22-10    right  . Laparoscopy  04-30-10     with mini laparotomy and cystotomy repair   Social History:  reports that she has been smoking Cigarettes.  She has a 15 pack-year smoking history. She has never used smokeless tobacco. She reports that she does not drink alcohol or use illicit drugs.  Allergies  Allergen Reactions  . Morphine And Related Hives  . Penicillins     REACTION: hives    Family History  Problem Relation Age of Onset  . COPD Mother   . Stroke Mother   . Coronary artery disease Mother   . Colon polyps Mother   . Breast cancer Mother   . Hypertension Father   . Colon polyps Brother   . Colon cancer Maternal Grandfather   . Colon polyps Maternal Grandfather   . Breast cancer Maternal Aunt   . Uterine cancer Cousin     x 2     Prior to Admission  medications   Medication Sig Start Date End Date Taking? Authorizing Provider  ALPRAZolam Duanne Moron) 1 MG tablet Take 1 mg by mouth 2 (two) times daily as needed for anxiety or sleep.   Yes Historical Provider, MD  gabapentin (NEURONTIN) 600 MG tablet Take 2 tablets (1,200 mg total) by mouth 3 (three) times daily. 06/06/13  Yes Rowe Clack, MD  HYDROmorphone (DILAUDID) 2 MG tablet Take 1 tablet (2 mg total) by mouth every 8 (eight) hours as needed. Max 3 tabs per day. Do not fill more often every 30 days 09/12/13  Yes Rowe Clack, MD  rOPINIRole (REQUIP) 2 MG tablet Take 2 mg by mouth at bedtime.   Yes Historical Provider, MD   Physical Exam: Filed Vitals:   09/24/13 1941 09/24/13 2030 09/24/13 2100 09/24/13 2200  BP: 99/68 110/61 98/66 118/69  Pulse: 70 61 64 63    Temp: 97.6 F (36.4 C)   97.7 F (36.5 C)  TempSrc: Oral   Oral  Resp: 18 13 15 12   Height:    5\' 6"  (1.676 m)  Weight:    110.3 kg (243 lb 2.7 oz)  SpO2: 96% 97% 97% 100%    Wt Readings from Last 3 Encounters:  09/24/13 110.3 kg (243 lb 2.7 oz)  06/06/13 108.591 kg (239 lb 6.4 oz)  01/29/13 103.874 kg (229 lb)    General:  Appears in mild discomfort. Eyes: PERRL, normal lids, irises & conjunctiva Neck: no LAD, masses or thyromegaly Cardiovascular: RRR, no m/r/g. No LE edema. Respiratory: CTA bilaterally, no w/r/r. Normal respiratory effort. Abdomen: soft, ntnd Skin: no rash or induration seen on limited exam Musculoskeletal: grossly normal tone BUE/BLE Neurologic: grossly non-focal.          Labs on Admission:  Basic Metabolic Panel:  Recent Labs Lab 09/24/13 1607  NA 138  K 3.8  CL 100  CO2 27  GLUCOSE 121*  BUN 12  CREATININE 1.10  CALCIUM 9.6   Liver Function Tests: No results found for this basename: AST, ALT, ALKPHOS, BILITOT, PROT, ALBUMIN,  in the last 168 hours No results found for this basename: LIPASE, AMYLASE,  in the last 168 hours No results found for this basename: AMMONIA,  in the last 168 hours CBC:  Recent Labs Lab 09/24/13 1607  WBC 6.4  HGB 13.4  HCT 41.1  MCV 94.1  PLT 204   Cardiac Enzymes:  Recent Labs Lab 09/24/13 2255  TROPONINI <0.30    BNP (last 3 results)  Recent Labs  09/24/13 1607  PROBNP 39.1   CBG: No results found for this basename: GLUCAP,  in the last 168 hours  Radiological Exams on Admission: Dg Chest 2 View  09/24/2013   CLINICAL DATA:  Left chest pain.  Smoker.  EXAM: CHEST  2 VIEW  COMPARISON:  05/12/2009  FINDINGS: Stable normal sized heart and clear lungs. Mild diffuse peribronchial thickening and accentuation of the interstitial markings with mild progression. Mild thoracic spine degenerative changes. Cholecystectomy clips.  IMPRESSION: No acute abnormality. Mildly progressive chronic bronchitic  changes compatible with the history of smoking.   Electronically Signed   By: Enrique Sack M.D.   On: 09/24/2013 19:43    EKG: pending.   Assessment/Plan Active Problems:   OBESITY   TOBACCO ABUSE   RESTLESS LEG SYNDROME   Depression   Chest pain  Substernal chest pain worsens on deep breathing associated with sob: her oxygen sats normal on RA. CXR neg for pneumonia.  Atypical in  nature. Will admit her to evaluate for ACS.  Serial troponins and echocardiogram ordered.  D dimer is elevated will get CT angio of the chest to rule out PE.   Restless leg syndrome: - resume requip.      Code Status: full code DVT Prophylaxis: lovenox.  Family Communication: none at bedside Disposition Plan: admit to tele  Time spent: 60 min  [facility] Pager [phone]  **Disclaimer: This note may have been dictated with voice recognition software. Similar sounding words can inadvertently be transcribed and this note may contain transcription errors which may not have been corrected upon publication of note.**

## 2013-09-24 NOTE — ED Notes (Addendum)
Pt c/o weakness, nausea, and intermittent central/L side chest pain x 2 weeks and increased chest pain,bilateral arm numbness, and facial numbness starting this afternoon.  Pain score 8/10.  Pt reports when she previously had these symptoms she just rested and they went away.  Pt reports taking a "nerve pill" w/o relief.  Pt report that there are several medications that she has stopped taking, because she has no insurance.  Facial symmetry noted.  LUE weaker, due to previous injury.

## 2013-09-24 NOTE — ED Provider Notes (Signed)
CSN: [id number]     Arrival date & time 09/24/13  1546 History   First MD Initiated Contact with Patient 09/24/13 1911     Chief Complaint  Patient presents with  . Chest Pain  . Shortness of Breath     (Consider location/radiation/quality/duration/timing/severity/associated sxs/prior Treatment) HPI Comments: 48 year old female, history of anxiety, depression, chronic pain of her right lower extremity, acid reflux, hypercholesterolemia and tobacco use. She presents with approximately 3 weeks of chest pain which he describes as a heaviness or pressure on the central and left-sided her chest. This has increased over the last couple of days and is worse with exertion. She states that it does not radiate but has had associated numbness of her left arm and her face. She has had no coughing, no fevers, no chills but does feel short of breath like she cannot take a deep breath. She has been taking her benzodiazepines without improvement. At this time her pain is 6/10, she takes Dilaudid 3 times a day for her chronic pain which did not help the chest pain. She reports having a stress test many many years ago but does not rubber when it was. She does have a family history of heart disease  Patient is a 48 y.o. female presenting with chest pain and shortness of breath. The history is provided by the patient.  Chest Pain Associated symptoms: shortness of breath   Shortness of Breath Associated symptoms: chest pain     Past Medical History  Diagnosis Date  . Depression   . Barrett's esophagus     egd 2007 + 2009 but none 2011 or 08/2010 egd  . Colon polyp     hyperplastic  . Grief reaction   . Chronic headache   . Tobacco abuse   . Migraine   . Restless leg   . IBS (irritable bowel syndrome)   . Hidradenitis suppurativa   . Ovarian cyst   . OSA (obstructive sleep apnea)    Past Surgical History  Procedure Laterality Date  . Wrist surgery      right  . Cesarean section      x 2  .  Cholecystectomy    . Tubal ligation    . Plantar fascitis      right foot  . Abdominal hysterectomy      secondary to endometriosis  . Laparoscopy  04-22-10    lysis of adhesions  . Elbow surgery      left  . Salpingectomy  04-22-10    right  . Laparoscopy  04-30-10     with mini laparotomy and cystotomy repair   Family History  Problem Relation Age of Onset  . COPD Mother   . Stroke Mother   . Coronary artery disease Mother   . Colon polyps Mother   . Breast cancer Mother   . Hypertension Father   . Colon polyps Brother   . Colon cancer Maternal Grandfather   . Colon polyps Maternal Grandfather   . Breast cancer Maternal Aunt   . Uterine cancer Cousin     x 2   History  Substance Use Topics  . Smoking status: Current Every Day Smoker -- 0.50 packs/day for 30 years    Types: Cigarettes  . Smokeless tobacco: Never Used     Comment: BUT NOT READY  . Alcohol Use: No   OB History   Grav Para Term Preterm Abortions TAB SAB Ect Mult Living  Review of Systems  Respiratory: Positive for shortness of breath.   Cardiovascular: Positive for chest pain.  All other systems reviewed and are negative.     Allergies  Morphine and related and Penicillins  Home Medications   Prior to Admission medications   Medication Sig Start Date End Date Taking? Authorizing Provider  ALPRAZolam Duanne Moron) 1 MG tablet Take 1 mg by mouth 2 (two) times daily as needed for anxiety or sleep.   Yes Historical Provider, MD  gabapentin (NEURONTIN) 600 MG tablet Take 2 tablets (1,200 mg total) by mouth 3 (three) times daily. 06/06/13  Yes Rowe Clack, MD  HYDROmorphone (DILAUDID) 2 MG tablet Take 1 tablet (2 mg total) by mouth every 8 (eight) hours as needed. Max 3 tabs per day. Do not fill more often every 30 days 09/12/13  Yes Rowe Clack, MD  rOPINIRole (REQUIP) 2 MG tablet Take 2 mg by mouth at bedtime.   Yes Historical Provider, MD   BP 99/68  Pulse 70  Temp(Src)  97.6 F (36.4 C) (Oral)  Resp 18  SpO2 96% Physical Exam  Nursing note and vitals reviewed. Constitutional: She appears well-developed and well-nourished. No distress.  HENT:  Head: Normocephalic and atraumatic.  Mouth/Throat: Oropharynx is clear and moist. No oropharyngeal exudate.  Eyes: Conjunctivae and EOM are normal. Pupils are equal, round, and reactive to light. Right eye exhibits no discharge. Left eye exhibits no discharge. No scleral icterus.  Neck: Normal range of motion. Neck supple. No JVD present. No thyromegaly present.  Cardiovascular: Normal rate, regular rhythm, normal heart sounds and intact distal pulses.  Exam reveals no gallop and no friction rub.   No murmur heard. Pulmonary/Chest: Effort normal and breath sounds normal. No respiratory distress. She has no wheezes. She has no rales.  Abdominal: Soft. Bowel sounds are normal. She exhibits no distension and no mass. There is no tenderness.  Musculoskeletal: Normal range of motion. She exhibits no edema and no tenderness.  Lymphadenopathy:    She has no cervical adenopathy.  Neurological: She is alert. Coordination normal.  Skin: Skin is warm and dry. No rash noted. No erythema.  Psychiatric: She has a normal mood and affect. Her behavior is normal.    ED Course  Procedures (including critical care time) Labs Review Labs Reviewed  BASIC METABOLIC PANEL - Abnormal; Notable for the following:    Glucose, Bld 121 (*)    GFR calc non Af Amer 58 (*)    GFR calc Af Amer 68 (*)    All other components within normal limits  D-DIMER, QUANTITATIVE - Abnormal; Notable for the following:    D-Dimer, Quant 0.91 (*)    All other components within normal limits  CBC  PRO B NATRIURETIC PEPTIDE  I-STAT TROPOININ, ED    Imaging Review Dg Chest 2 View  09/24/2013   CLINICAL DATA:  Left chest pain.  Smoker.  EXAM: CHEST  2 VIEW  COMPARISON:  05/12/2009  FINDINGS: Stable normal sized heart and clear lungs. Mild diffuse  peribronchial thickening and accentuation of the interstitial markings with mild progression. Mild thoracic spine degenerative changes. Cholecystectomy clips.  IMPRESSION: No acute abnormality. Mildly progressive chronic bronchitic changes compatible with the history of smoking.   Electronically Signed   By: Enrique Sack M.D.   On: 09/24/2013 19:43    ED ECG REPORT  I personally interpreted this EKG   Date: 09/24/2013   Rate: 86  Rhythm: normal sinus rhythm  QRS Axis: normal  Intervals: normal  ST/T Wave abnormalities: normal  Conduction Disutrbances:none  Narrative Interpretation:   Old EKG Reviewed: none available   MDM   Final diagnoses:  Other chest pain    The patient has no acute findings on her physical exam, laboratory workup thus far has been unremarkable, will add a d-dimer, chest x-ray and treated with nitroglycerin and aspirin. The patient does have some concern for exertional chest pain, will rule out ovarian embolism prior to considering admission for coronary artery disease rule out. The patient is in agreement with the plan.  The patient has unremarkable labs, her escalating chest pain especially with exertion problems further evaluation as an inpatient. Discussed with hospitalist who agrees.  Johnna Acosta, MD 09/24/13 2100

## 2013-09-25 ENCOUNTER — Encounter (HOSPITAL_COMMUNITY): Payer: Self-pay | Admitting: Radiology

## 2013-09-25 DIAGNOSIS — R0789 Other chest pain: Secondary | ICD-10-CM | POA: Diagnosis present

## 2013-09-25 DIAGNOSIS — K2270 Barrett's esophagus without dysplasia: Secondary | ICD-10-CM

## 2013-09-25 DIAGNOSIS — E669 Obesity, unspecified: Secondary | ICD-10-CM

## 2013-09-25 DIAGNOSIS — I517 Cardiomegaly: Secondary | ICD-10-CM

## 2013-09-25 LAB — TSH: TSH: 1.64 u[IU]/mL (ref 0.350–4.500)

## 2013-09-25 LAB — TROPONIN I: Troponin I: <0.3 ng/mL (ref <0.30)

## 2013-09-25 MED ORDER — IOHEXOL 350 MG/ML SOLN
100.0000 mL | Freq: Once | INTRAVENOUS | Status: AC | PRN
  Administered 2013-09-25: 100 mL via INTRAVENOUS

## 2013-09-25 MED ORDER — PANTOPRAZOLE SODIUM 40 MG PO TBEC: 40.0000 mg | DELAYED_RELEASE_TABLET | Freq: Every day | ORAL | Status: DC

## 2013-09-25 MED ORDER — GI COCKTAIL ~~LOC~~
30.0000 mL | Freq: Once | ORAL | Status: AC
  Administered 2013-09-25: 30 mL via ORAL
  Filled 2013-09-25: qty 30

## 2013-09-25 MED ORDER — PANTOPRAZOLE SODIUM 40 MG PO TBEC
40.0000 mg | DELAYED_RELEASE_TABLET | Freq: Every day | ORAL | Status: DC
  Administered 2013-09-25: 40 mg via ORAL
  Filled 2013-09-25 (×2): qty 1

## 2013-09-25 MED ORDER — ALUM & MAG HYDROXIDE-SIMETH 200-200-20 MG/5ML PO SUSP: 15.0000 mL | Freq: Four times a day (QID) | ORAL | Status: DC | PRN

## 2013-09-25 MED ORDER — ONDANSETRON HCL 4 MG PO TABS: 4.0000 mg | ORAL_TABLET | Freq: Three times a day (TID) | ORAL | Status: DC | PRN

## 2013-09-25 NOTE — Progress Notes (Signed)
UR completed 

## 2013-09-25 NOTE — Progress Notes (Signed)
Echocardiogram 2D Echocardiogram has been performed.  Josearmando Kuhnert 09/25/2013, 1:48 PM

## 2013-09-25 NOTE — Progress Notes (Signed)
MD was notified of patients heart rate dropping down to 39 sinus bradycardia while patient was sleeping. Will continue to monitor patient. [name] White, [name] White

## 2013-09-25 NOTE — Progress Notes (Signed)
Spoke with MD on phone regarding patient being able to drive home. OK to drive per MD. Will discharge at this time per order. Setzer, Marchelle Folks

## 2013-09-25 NOTE — Care Management Note (Signed)
    Page 1 of 1   09/25/2013     5:03:23 PM CARE MANAGEMENT NOTE 09/25/2013  Patient:  [name] White,[name] White   Account Number:  [id number]  Date Initiated:  09/25/2013  Documentation initiated by:  Dessa Phi  Subjective/Objective Assessment:   48 Y/O F ADMITTED W/CHEST PAIN.     Action/Plan:   FROM HOME.NO INSURANCE OR PCP.   Anticipated DC Date:  09/25/2013   Anticipated DC Plan:  [place]  CM consult  [facility]  Medication Assistance      Choice offered to / List presented to:             Status of service:  In process, will continue to follow Medicare Important Message given?   (If response is "NO", the following Medicare IM given date fields will be blank) Date Medicare IM given:   Medicare IM given by:   Date Additional Medicare IM given:   Additional Medicare IM given by:    Discharge Disposition:    Per UR Regulation:  Reviewed for med. necessity/level of care/duration of stay  If discussed at [place] of Stay Meetings, dates discussed:    Comments:  09/25/13 [facility] RN,BSN NCM [phone] APPT SET [place] 09/28/13 @ 9A.PATIENT CAN GO TO [facility] ([facility]) BEFORE 5:15P TODAY OR TOMORROW 9A-6P FOR MEDS THAT ARE REASONABLE-NEURONTIN $4, OR $12 IF OVER 90 TABS.PATIENT STATES SHE CAN AFFORD, & THAT SHE HAS ENOUGH OF MEDS TILL GO TO PHARMACY TOMOORW IF CLOSED BY THE TIME SHE White/C.PROVIDED [place] INSURANCE RESOURCES.PATIENT STATES SHE WORKS.INFORMED THAT SHE WOULD NEED A SLEEP STUDY TO DETERMINE IF SHE QUALIFIES FOR A CPAP, THEN THE DME COMPANY WOULD WORK OUT A PAYMENT PLAN IF MD SROTE SCRIPT FOR A CPAP.PATIENT VOICED UNDERSTANDING.

## 2013-09-25 NOTE — Discharge Summary (Signed)
Physician Discharge Summary  [name] White IRW:[id number] DOB: [birth year] DOA: 09/24/2013  PCP: Gwendolyn Grant, MD  Admit date: 09/24/2013 Discharge date: 09/25/2013  Time spent: 25 minutes  Recommendations for Outpatient Follow-up:  1. D/c home with outpt PCP follow up. If chest pain recurrent would recommend cardiology referral for evaluation for stress test.  Discharge Diagnoses:  Principle problem  Musculoskeletal chest pain   Active Problems:   OBESITY   TOBACCO ABUSE   RESTLESS LEG SYNDROME   BARRETTS ESOPHAGUS   Depression   OSA (obstructive sleep apnea)     Discharge Condition: fair  Diet recommendation: regular  Filed Weights   09/24/13 2200  Weight: 110.3 kg (243 lb 2.7 oz)    History of present illness:  Please refer to admission H&P for details, but in brief, 48 y.o. Obese female with h/o with depression, IBS, anxiety, presented with  substernal chest pain, started 3 weeks ago, worsened 1 day prior to admission associated with nausea, diaphoresis, and shortness of breath. She reports Chest pain occurs both at rest and on activity, worsens on deep breathing, resolves by itself.  Initial troponin was negative. EKG unremarkable. D dimer was mildly elevated.  Hospital admission requested.   Hospital Course:  Chest pain  patient has reproducible left sided chest pain and seems musculoskeletal. No hx of trauma or lifting heavy items. Serial troponin were negative. EKG unremarkable. Stable on tele except for occasional PVCs. CT angiogram done for elevated d-dimer was negative for PE. orthostasis checked for dizziness was negative 2D echo done showed low normal EF of 50-55%  Symptoms possibly also triggered by GERD ( has barrett's on EGD and recommended for PPI). Will prescribe  maalox and PPI. Her heart score is only 3 and does not need inpatient w/up further.  Follow up with PCP and if has persistent symptoms should refer to cardiology for evaluation.  OSA  to  continue CPAP   Restless leg syndrome  continue requip  chronic pain  continue home pain meds  Tobacco abuse counseled on smoking cessation     Procedures:  2d echo  Consultations:  none  Discharge Exam: Filed Vitals:   09/25/13 1328  BP: 94/54  Pulse: 61  Temp: 97.3 F (36.3 C)  Resp: 15    General: NAD HEENT: no pallor, moist mucosa Chest: clear b/l, reproducible pain on pressure over the chest CVS: NS1&S2, no murmurs Abd: soft, NT, ND, BS+ Ext: warm, no edema   Discharge Instructions You were cared for by a hospitalist during your hospital stay. If you have any questions about your discharge medications or the care you received while you were in the hospital after you are discharged, you can call the unit and asked to speak with the hospitalist on call if the hospitalist that took care of you is not available. Once you are discharged, your primary care physician will handle any further medical issues. Please note that NO REFILLS for any discharge medications will be authorized once you are discharged, as it is imperative that you return to your primary care physician (or establish a relationship with a primary care physician if you do not have one) for your aftercare needs so that they can reassess your need for medications and monitor your lab values.     Medication List         ALPRAZolam 1 MG tablet  Commonly known as:  XANAX  Take 1 mg by mouth 2 (two) times daily as needed for anxiety or sleep.  alum & mag hydroxide-simeth 200-200-20 MG/5ML suspension  Commonly known as:  MAALOX ADVANCED  Take 15 mLs by mouth every 6 (six) hours as needed for indigestion or heartburn.     gabapentin 600 MG tablet  Commonly known as:  NEURONTIN  Take 2 tablets (1,200 mg total) by mouth 3 (three) times daily.     HYDROmorphone 2 MG tablet  Commonly known as:  DILAUDID  Take 1 tablet (2 mg total) by mouth every 8 (eight) hours as needed. Max 3 tabs per day. Do  not fill more often every 30 days     ondansetron 4 MG tablet  Commonly known as:  ZOFRAN  Take 1 tablet (4 mg total) by mouth every 8 (eight) hours as needed for nausea or vomiting.     pantoprazole 40 MG tablet  Commonly known as:  PROTONIX  Take 1 tablet (40 mg total) by mouth daily at 6 (six) AM.     rOPINIRole 2 MG tablet  Commonly known as:  REQUIP  Take 2 mg by mouth at bedtime.       Allergies  Allergen Reactions  . Morphine And Related Hives  . Penicillins     REACTION: hives       Follow-up Information   Follow up with Gwendolyn Grant, MD. Schedule an appointment as soon as possible for a visit in 1 week.   Specialty:  Internal Medicine   Contact information:   [address] [place]  [address] [phone]        The results of significant diagnostics from this hospitalization (including imaging, microbiology, ancillary and laboratory) are listed below for reference.    Significant Diagnostic Studies: Dg Chest 2 View  09/24/2013   CLINICAL DATA:  Left chest pain.  Smoker.  EXAM: CHEST  2 VIEW  COMPARISON:  05/12/2009  FINDINGS: Stable normal sized heart and clear lungs. Mild diffuse peribronchial thickening and accentuation of the interstitial markings with mild progression. Mild thoracic spine degenerative changes. Cholecystectomy clips.  IMPRESSION: No acute abnormality. Mildly progressive chronic bronchitic changes compatible with the history of smoking.   Electronically Signed   By: Enrique Sack M.D.   On: 09/24/2013 19:43   Ct Angio Chest Pe W/cm &/or Wo Cm  09/25/2013   CLINICAL DATA:  Left chest pain.  Elevated D-dimer.  EXAM: CT ANGIOGRAPHY CHEST WITH CONTRAST  TECHNIQUE: Multidetector CT imaging of the chest was performed using the standard protocol during bolus administration of intravenous contrast. Multiplanar CT image reconstructions and MIPs were obtained to evaluate the vascular anatomy.  CONTRAST:  159mL OMNIPAQUE  IOHEXOL 350 MG/ML SOLN  COMPARISON:  CXR 09/24/2013  FINDINGS: Negative for pulmonary embolism. Negative for aortic aneurysm or dissection. Heart size normal. Negative for pericardial effusion.  The lungs are clear. Negative for pneumonia. Negative for pleural effusion. Negative for mass or adenopathy.  Review of the MIP images confirms the above findings.  IMPRESSION: Negative   Electronically Signed   By: Franchot Gallo M.D.   On: 09/25/2013 01:09    Microbiology: No results found for this or any previous visit (from the past 240 hour(s)).   Labs: Basic Metabolic Panel:  Recent Labs Lab 09/24/13 1607  NA 138  K 3.8  CL 100  CO2 27  GLUCOSE 121*  BUN 12  CREATININE 1.10  CALCIUM 9.6   Liver Function Tests: No results found for this basename: AST, ALT, ALKPHOS, BILITOT, PROT, ALBUMIN,  in the last  168 hours No results found for this basename: LIPASE, AMYLASE,  in the last 168 hours No results found for this basename: AMMONIA,  in the last 168 hours CBC:  Recent Labs Lab 09/24/13 1607  WBC 6.4  HGB 13.4  HCT 41.1  MCV 94.1  PLT 204   Cardiac Enzymes:  Recent Labs Lab 09/24/13 2255 09/25/13 0406 09/25/13 1025  TROPONINI <0.30 <0.30 <0.30   BNP: BNP (last 3 results)  Recent Labs  09/24/13 1607  PROBNP 39.1   CBG: No results found for this basename: GLUCAP,  in the last 168 hours     Signed:  Shardee Dieu  [facility] 09/25/2013, 4:55 PM

## 2013-09-26 ENCOUNTER — Telehealth: Payer: Self-pay | Admitting: *Deleted

## 2013-09-26 NOTE — Telephone Encounter (Signed)
Left msg on triage stating was d/c from hospital on yesterday needing to make appt. Called pt back set pt up with Dr. Linna Darner for 09/27/13,,,.lmb

## 2013-09-27 ENCOUNTER — Other Ambulatory Visit (INDEPENDENT_AMBULATORY_CARE_PROVIDER_SITE_OTHER): Payer: Self-pay

## 2013-09-27 ENCOUNTER — Encounter: Payer: Self-pay | Admitting: Internal Medicine

## 2013-09-27 ENCOUNTER — Ambulatory Visit (INDEPENDENT_AMBULATORY_CARE_PROVIDER_SITE_OTHER): Payer: Self-pay | Admitting: Internal Medicine

## 2013-09-27 VITALS — BP 100/70 | HR 82 | Temp 97.0°F | Wt 244.2 lb

## 2013-09-27 DIAGNOSIS — Z86010 Personal history of colonic polyps: Secondary | ICD-10-CM

## 2013-09-27 DIAGNOSIS — R079 Chest pain, unspecified: Secondary | ICD-10-CM

## 2013-09-27 DIAGNOSIS — E669 Obesity, unspecified: Secondary | ICD-10-CM

## 2013-09-27 DIAGNOSIS — R42 Dizziness and giddiness: Secondary | ICD-10-CM

## 2013-09-27 DIAGNOSIS — R4182 Altered mental status, unspecified: Secondary | ICD-10-CM

## 2013-09-27 DIAGNOSIS — F17200 Nicotine dependence, unspecified, uncomplicated: Secondary | ICD-10-CM

## 2013-09-27 DIAGNOSIS — G4733 Obstructive sleep apnea (adult) (pediatric): Secondary | ICD-10-CM

## 2013-09-27 DIAGNOSIS — K2270 Barrett's esophagus without dysplasia: Secondary | ICD-10-CM

## 2013-09-27 LAB — BASIC METABOLIC PANEL
BUN: 9 mg/dL (ref 6–23)
CHLORIDE: 102 meq/L (ref 96–112)
CO2: 29 meq/L (ref 19–32)
Calcium: 9.5 mg/dL (ref 8.4–10.5)
Creatinine, Ser: 1.1 mg/dL (ref 0.4–1.2)
GFR: 58.57 mL/min — AB (ref >60.00)
GLUCOSE: 79 mg/dL (ref 70–99)
POTASSIUM: 4.2 meq/L (ref 3.5–5.1)
SODIUM: 139 meq/L (ref 135–145)

## 2013-09-27 LAB — HEPATIC FUNCTION PANEL
ALBUMIN: 4 g/dL (ref 3.5–5.2)
ALK PHOS: 62 U/L (ref 39–117)
ALT: 22 U/L (ref 0–35)
AST: 20 U/L (ref 0–37)
Bilirubin, Direct: 0.1 mg/dL (ref 0.0–0.3)
Total Bilirubin: 0.5 mg/dL (ref 0.2–1.2)
Total Protein: 7.5 g/dL (ref 6.0–8.3)

## 2013-09-27 LAB — TSH: TSH: 1.19 u[IU]/mL (ref 0.35–4.50)

## 2013-09-27 NOTE — Patient Instructions (Signed)
   As discussed I would recommend you attempt to decrease the sedating medicines. For instance  I would recommend gabapentin be taken one tablet every 8 hours until your mental status is clearer. I would take the Dilaudid as little as possible. I would decrease the alprazolam dose by one half.  I would recommend that you have a friend come take you home or take a taxi cab as medically  I do not feel would be in your best interest to drive.

## 2013-09-27 NOTE — Progress Notes (Signed)
Pre visit review using our clinic review tool, if applicable. No additional management support is needed unless otherwise documented below in the visit note. 

## 2013-09-27 NOTE — Progress Notes (Signed)
Subjective:    Patient ID: [name] White, female    DOB: [birth year], 48 y.o.   MRN: [id number]  HPI  She was hospitalized 8/3-09/25/13 with chest pain. It had been present for approximately 3 weeks. The day prior to admission it was associated with nausea. Troponin was negative x3. D-dimer was elevated at 0.91; CT angiogram was negative. GFR was mildly reduced at 58;non fasting glucose was 121.   The chest pain has resolved; but her major problem is now dizziness and feeling as if she will pass out.  Past medical history is positive for Barrett's esophagus. Only reflux changes on biopsy in 2012. She also had colon polypectomy in  2012 ; hyperplastic polyp on path.    She also has obstructive sleep apnea for which CPAP was prescribed.This is not employed as a new mask is need, but she can not afford this.  She continues to smoke 6 cigarettes a day and is not motivated to quit at this time   By history Dr. Nicholaus Bloom released her from the pain clinic because "he found Benadryl in the urine and I was not willing to lie about it". She states that Dr. Asa Lente has been filling her Dilaudid until she can be referred to another chronic pain clinic.  She is on gabapentin 600 mg 2 pills 3 times a day for chronic pain related to a "blotched surgery" by a gynecologist. Apparently she believes that the bladder was cut and was not diagnosed until a second exploratory surgery.She states the surgery resulted in chronic nerve damage necessitating chronic pain management.  She states she is only taking Xanax one at bedtime; the prescriptions is written  as 1 twice a day. She states that this is prescribed by Dr. Olevia Perches, GI.  Review of Systems    Chest pain, palpitations, tachycardia, exertional dyspnea, paroxysmal nocturnal dyspnea, claudication or edema are absent. Unexplained weight loss, abdominal pain, significant dyspepsia, dysphagia, melena, rectal bleeding, or persistently small caliber stools  are denied.     Objective:   Physical Exam   Pertinent or  positive findings included profound lethargy. Speech is somewhat slurred. She has wax in the left otic canal. There is minor nystagmus which is unsustained with right lateral gaze. Abdomen is protuberant with dullness in the right upper quadrant. Based on CDC criteria in Epic she is on the border of severe obesity (BMI > 40)with a BMI of 39.44. Romberg is unsteady but she did not fall. There is past-pointing with finger to nose testing.  Head: Normocephalic without obvious abnormalities Eyes: No corneal or conjunctival inflammation noted. Pupils equal round reactive to light and accommodation. Extraocular motion intact.  Ears: External  ear exam reveals no significant lesions or deformities. Hearing is grossly normal bilaterally. Nose: External nasal exam reveals no deformity or inflammation. Nasal mucosa are pink and moist. No lesions or exudates noted.   Mouth: Oral mucosa and oropharynx reveal no lesions or exudates. Teeth in good repair. Neck: No deformities, masses, or tenderness noted. Range of motion & Thyroid normal. Lungs: Normal respiratory effort; chest expands symmetrically. Lungs are clear to auscultation without rales, wheezes, or increased work of breathing. Heart: Normal rate and rhythm. Normal S1 and S2. No gallop, click, or rub. No murmur. Abdomen: Bowel sounds normal; abdomen soft and nontender. No masses, definite organomegaly or hernias noted.  Musculoskeletal/extremities: No deformity or scoliosis noted of  the thoracic or lumbar spine. No clubbing, cyanosis, edema, or significant extremity  deformity noted. Range of motion normal .Tone & strength normal. Hand joints normal.  Fingernail health good. Able to lie down & sit up w/o help. Negative SLR bilaterally Vascular: Carotid, radial artery, dorsalis pedis and  posterior tibial pulses are full and equal. No bruits  present. Neurologic: oriented x3. Deep tendon reflexes symmetrical and normal.  Gait slightly unsteady  Skin: Intact without suspicious lesions or rashes. Lymph: No cervical, axillary lymphadenopathy present. Psych: Mood and affect are flat.  She was able to identify the President. Spelling and memory recall were normal.                                                                                       Assessment & Plan:  #1 altered mental status; clinical exam does not suggest neurologic deficit but suggests drug effect. I discussed with her about weaning any sedating drugs which she is taking which include Xanax, gabapentin, and Dilaudid. Labs will be repeated to rule out chemistry/electrolyte abnormalities ( Ex hyponatremia). Symptoms could result from untreated OSA. Urine drug screen will be performed.

## 2013-10-02 ENCOUNTER — Encounter: Payer: Self-pay | Admitting: Internal Medicine

## 2013-10-11 ENCOUNTER — Encounter: Payer: Self-pay | Admitting: Internal Medicine

## 2013-10-15 ENCOUNTER — Other Ambulatory Visit: Payer: Self-pay

## 2013-10-15 ENCOUNTER — Telehealth: Payer: Self-pay

## 2013-10-15 NOTE — Telephone Encounter (Signed)
Ok to print and i will sign As pt saw Hopp in 09/2013 she does not need to see me - any OV with me or my partners within 6 mo ok with me

## 2013-10-15 NOTE — Telephone Encounter (Signed)
Pt requesting refill for dilaudid 2 mg 1 tab po q8h prn, qty 90.  You have not seen this pt since 06/06/2013.  Do you want to refill? Please advise

## 2013-10-16 ENCOUNTER — Encounter: Payer: Self-pay | Admitting: Internal Medicine

## 2013-10-16 MED ORDER — HYDROMORPHONE HCL 2 MG PO TABS: 2.0000 mg | ORAL_TABLET | Freq: Three times a day (TID) | ORAL | Status: DC | PRN

## 2013-10-16 NOTE — Telephone Encounter (Signed)
RX singed and picked up by pt

## 2013-11-08 ENCOUNTER — Telehealth: Payer: Self-pay | Admitting: Internal Medicine

## 2013-11-08 DIAGNOSIS — G894 Chronic pain syndrome: Secondary | ICD-10-CM

## 2013-11-08 NOTE — Telephone Encounter (Signed)
Pt called in said that she really needs Dr to find her a pain Management clinic that will see her.  She said that she is trying to get disability and they need info from a pain management dr in order for her to get it.  She said that she went to [facility] with Dr Hardin Negus before.  She currently does not have ins so she is paying out of pocket.    Thank you!!

## 2013-11-08 NOTE — Telephone Encounter (Signed)
Will re-refer to pain clinic - please ask [facility] to work with various pain providers on arranging same

## 2013-11-12 ENCOUNTER — Telehealth: Payer: Self-pay | Admitting: Internal Medicine

## 2013-11-12 NOTE — Telephone Encounter (Signed)
Patient is requesting script for dilaudid.  She needs by Friday b/c she is going out of town .

## 2013-11-13 MED ORDER — HYDROMORPHONE HCL 2 MG PO TABS: 2.0000 mg | ORAL_TABLET | Freq: Three times a day (TID) | ORAL | Status: DC | PRN

## 2013-11-13 NOTE — Telephone Encounter (Signed)
Pt informed Rx ready for pick up here at the office

## 2013-11-13 NOTE — Telephone Encounter (Signed)
rx done

## 2013-11-21 NOTE — Telephone Encounter (Signed)
See referral for further notes.

## 2013-11-26 ENCOUNTER — Telehealth: Payer: Self-pay

## 2013-11-26 MED ORDER — ROPINIROLE HCL 2 MG PO TABS: 2.0000 mg | ORAL_TABLET | Freq: Every day | ORAL | Status: DC

## 2013-11-26 NOTE — Telephone Encounter (Signed)
Refill request for requip hcl 2 mg tab, take 1 tab po qhs, qty 30 w/ 3 rfs, done and sent to pharmacy

## 2013-12-10 ENCOUNTER — Telehealth: Payer: Self-pay | Admitting: Internal Medicine

## 2013-12-10 MED ORDER — HYDROMORPHONE HCL 2 MG PO TABS: 2.0000 mg | ORAL_TABLET | Freq: Three times a day (TID) | ORAL | Status: DC | PRN

## 2013-12-10 NOTE — Telephone Encounter (Signed)
Patient is requesting refill on dilaudid.

## 2013-12-10 NOTE — Telephone Encounter (Signed)
Called pt no answer [facility] rx ready for pick-up.../lmb 

## 2013-12-10 NOTE — Telephone Encounter (Signed)
Printed and signed - ok

## 2013-12-19 ENCOUNTER — Other Ambulatory Visit: Payer: Self-pay | Admitting: Internal Medicine

## 2013-12-19 NOTE — Telephone Encounter (Signed)
Done hardcopy to [facility]bin  

## 2013-12-19 NOTE — Telephone Encounter (Signed)
Faxed hardcopy for Alprazolam to [facility] [place]

## 2014-01-08 ENCOUNTER — Telehealth: Payer: Self-pay | Admitting: Internal Medicine

## 2014-01-08 NOTE — Telephone Encounter (Signed)
Pt needs refill of Diladin, pls advise. ° ° °

## 2014-01-10 MED ORDER — HYDROMORPHONE HCL 2 MG PO TABS: 2.0000 mg | ORAL_TABLET | Freq: Three times a day (TID) | ORAL | Status: DC | PRN

## 2014-01-10 NOTE — Telephone Encounter (Signed)
Ok last UDS 09/27/13 reviewed

## 2014-01-28 ENCOUNTER — Encounter: Payer: Self-pay | Admitting: Internal Medicine

## 2014-01-28 ENCOUNTER — Other Ambulatory Visit (INDEPENDENT_AMBULATORY_CARE_PROVIDER_SITE_OTHER): Payer: Self-pay

## 2014-01-28 ENCOUNTER — Ambulatory Visit (INDEPENDENT_AMBULATORY_CARE_PROVIDER_SITE_OTHER): Payer: Self-pay | Admitting: Internal Medicine

## 2014-01-28 VITALS — BP 112/68 | HR 64 | Temp 97.6°F | Ht 66.0 in | Wt 238.5 lb

## 2014-01-28 DIAGNOSIS — Z23 Encounter for immunization: Secondary | ICD-10-CM

## 2014-01-28 DIAGNOSIS — E669 Obesity, unspecified: Secondary | ICD-10-CM

## 2014-01-28 DIAGNOSIS — M5417 Radiculopathy, lumbosacral region: Secondary | ICD-10-CM

## 2014-01-28 DIAGNOSIS — E785 Hyperlipidemia, unspecified: Secondary | ICD-10-CM

## 2014-01-28 DIAGNOSIS — Z0000 Encounter for general adult medical examination without abnormal findings: Secondary | ICD-10-CM

## 2014-01-28 DIAGNOSIS — M5416 Radiculopathy, lumbar region: Secondary | ICD-10-CM

## 2014-01-28 LAB — CBC WITH DIFFERENTIAL/PLATELET
BASOS PCT: 0.9 % (ref 0.0–3.0)
Basophils Absolute: 0 10*3/uL (ref 0.0–0.1)
EOS PCT: 10.9 % — AB (ref 0.0–5.0)
Eosinophils Absolute: 0.6 10*3/uL (ref 0.0–0.7)
HEMATOCRIT: 42.8 % (ref 36.0–46.0)
Hemoglobin: 14.2 g/dL (ref 12.0–15.0)
Lymphocytes Relative: 45.5 % (ref 12.0–46.0)
Lymphs Abs: 2.6 10*3/uL (ref 0.7–4.0)
MCHC: 33.2 g/dL (ref 30.0–36.0)
MCV: 92.4 fl (ref 78.0–100.0)
MONO ABS: 0.6 10*3/uL (ref 0.1–1.0)
Monocytes Relative: 10.9 % (ref 3.0–12.0)
Neutro Abs: 1.8 10*3/uL (ref 1.4–7.7)
Neutrophils Relative %: 31.8 % — ABNORMAL LOW (ref 43.0–77.0)
Platelets: 188 10*3/uL (ref 150.0–400.0)
RBC: 4.63 Mil/uL (ref 3.87–5.11)
RDW: 13.5 % (ref 11.5–15.5)
WBC: 5.7 10*3/uL (ref 4.0–10.5)

## 2014-01-28 LAB — BASIC METABOLIC PANEL
BUN: 10 mg/dL (ref 6–23)
CO2: 30 meq/L (ref 19–32)
Calcium: 9.2 mg/dL (ref 8.4–10.5)
Chloride: 103 mEq/L (ref 96–112)
Creatinine, Ser: 0.9 mg/dL (ref 0.4–1.2)
GFR: 72.5 mL/min (ref >60.00)
Glucose, Bld: 99 mg/dL (ref 70–99)
POTASSIUM: 4.4 meq/L (ref 3.5–5.1)
Sodium: 139 mEq/L (ref 135–145)

## 2014-01-28 LAB — URINALYSIS, ROUTINE W REFLEX MICROSCOPIC
Bilirubin Urine: NEGATIVE
Hgb urine dipstick: NEGATIVE
Ketones, ur: NEGATIVE
Leukocytes, UA: NEGATIVE
Nitrite: NEGATIVE
PH: 7 (ref 5.0–8.0)
RBC / HPF: NONE SEEN (ref 0–?)
SPECIFIC GRAVITY, URINE: 1.015 (ref 1.000–1.030)
TOTAL PROTEIN, URINE-UPE24: NEGATIVE
Urine Glucose: NEGATIVE
Urobilinogen, UA: 0.2 (ref 0.0–1.0)

## 2014-01-28 LAB — HEPATIC FUNCTION PANEL
ALT: 17 U/L (ref 0–35)
AST: 17 U/L (ref 0–37)
Albumin: 3.9 g/dL (ref 3.5–5.2)
Alkaline Phosphatase: 72 U/L (ref 39–117)
Bilirubin, Direct: 0.1 mg/dL (ref 0.0–0.3)
TOTAL PROTEIN: 7 g/dL (ref 6.0–8.3)
Total Bilirubin: 0.6 mg/dL (ref 0.2–1.2)

## 2014-01-28 LAB — LIPID PANEL
CHOLESTEROL: 230 mg/dL — AB (ref 0–200)
HDL: 48.2 mg/dL (ref >39.00)
LDL Cholesterol: 158 mg/dL — ABNORMAL HIGH (ref 0–99)
NonHDL: 181.8
TRIGLYCERIDES: 118 mg/dL (ref 0.0–149.0)
Total CHOL/HDL Ratio: 5
VLDL: 23.6 mg/dL (ref 0.0–40.0)

## 2014-01-28 LAB — TSH: TSH: 1.03 u[IU]/mL (ref 0.35–4.50)

## 2014-01-28 MED ORDER — ALPRAZOLAM 1 MG PO TABS: 1.0000 mg | ORAL_TABLET | Freq: Two times a day (BID) | ORAL | Status: DC | PRN

## 2014-01-28 MED ORDER — ROPINIROLE HCL 2 MG PO TABS: 2.0000 mg | ORAL_TABLET | Freq: Every day | ORAL | Status: DC

## 2014-01-28 MED ORDER — ONDANSETRON HCL 4 MG PO TABS: 4.0000 mg | ORAL_TABLET | Freq: Three times a day (TID) | ORAL | Status: DC | PRN

## 2014-01-28 MED ORDER — OMEPRAZOLE MAGNESIUM 20 MG PO TBEC: 40.0000 mg | DELAYED_RELEASE_TABLET | Freq: Every day | ORAL | Status: DC

## 2014-01-28 MED ORDER — PRAVASTATIN SODIUM 20 MG PO TABS: 20.0000 mg | ORAL_TABLET | Freq: Every day | ORAL | Status: DC

## 2014-01-28 MED ORDER — HYDROMORPHONE HCL 2 MG PO TABS: 2.0000 mg | ORAL_TABLET | Freq: Three times a day (TID) | ORAL | Status: DC | PRN

## 2014-01-28 NOTE — Assessment & Plan Note (Signed)
given LDL >160, smoking status and weight gain, started prava 20mg  qhs in 2013, but not taking same due to cost  The patient is asked to make an attempt to improve diet and exercise patterns to aid in medical management of this problem.  We'll resume statin depending on lab results today Also see above re: request to reconsider cessation to decrease CAD/CVA risk

## 2014-01-28 NOTE — Progress Notes (Signed)
Subjective:    Patient ID: [name] White, female    DOB: Aug 14, 1965, 48 y.o.   MRN: [id number]  HPI  patient is here today for annual physical. Patient feels well and has no complaints.  Also reviewed chronic medical issues and interval medical events  Past Medical History  Diagnosis Date  . Depression   . Barrett's esophagus     egd 2007 + 2009 but none 2011 or 08/2010 egd  . Colon polyp     hyperplastic  . Grief reaction   . Chronic headache   . Tobacco abuse   . Migraine   . Restless leg   . IBS (irritable bowel syndrome)   . Hidradenitis suppurativa   . Ovarian cyst   . OSA (obstructive sleep apnea)    Family History  Problem Relation Age of Onset  . COPD Mother   . Stroke Mother   . Coronary artery disease Mother     pacer  . Colon polyps Mother   . Breast cancer Mother   . Hypertension Father   . Colon polyps Brother   . Colon cancer Maternal Grandfather   . Colon polyps Maternal Grandfather   . Breast cancer Maternal Aunt   . Uterine cancer Cousin     x 2   History  Substance Use Topics  . Smoking status: Current Every Day Smoker -- 0.50 packs/day for 30 years    Types: Cigarettes  . Smokeless tobacco: Never Used     Comment: BUT NOT READY  . Alcohol Use: No    Review of Systems  Constitutional: Negative for fatigue and unexpected weight change.  Respiratory: Negative for cough, shortness of breath and wheezing.   Cardiovascular: Negative for chest pain, palpitations and leg swelling.  Gastrointestinal: Negative for nausea, abdominal pain and diarrhea.  Neurological: Negative for dizziness, weakness, light-headedness and headaches.  Psychiatric/Behavioral: Negative for dysphoric mood. The patient is not nervous/anxious.   All other systems reviewed and are negative.      Objective:   Physical Exam  BP 112/68 mmHg  Pulse 64  Temp(Src) 97.6 F (36.4 C) (Oral)  Ht 5\' 6"  (1.676 m)  Wt 238 lb 8 oz (108.183 kg)  BMI 38.51 kg/m2  SpO2 97% Wt  Readings from Last 3 Encounters:  01/28/14 238 lb 8 oz (108.183 kg)  09/27/13 244 lb 4 oz (110.791 kg)  09/24/13 243 lb 2.7 oz (110.3 kg)   Constitutional: She is obese, appears well-developed and well-nourished. No distress.  HENT: Head: Normocephalic and atraumatic. Ears: B TMs ok, no erythema or effusion; Nose: Nose normal. Mouth/Throat: Oropharynx is clear and moist. No oropharyngeal exudate.  Eyes: Conjunctivae and EOM are normal. Pupils are equal, round, and reactive to light. No scleral icterus.  Neck: Normal range of motion. Neck supple. No JVD present. No thyromegaly present.  Cardiovascular: Normal rate, regular rhythm and normal heart sounds.  No murmur heard. No BLE edema. Pulmonary/Chest: Effort normal and breath sounds normal. No respiratory distress. She has no wheezes.  Abdominal: Soft. Bowel sounds are normal. She exhibits no distension. There is no tenderness. no masses GU/breast: defer to gyn Musculoskeletal: Back: full range of motion of thoracic and lumbar spine. Non tender to palpation. Negative straight leg raise. DTR's are symmetrically intact. Sensation intact in all dermatomes of the lower extremities. Full strength to manual muscle testing. patient is able to heel toe walk without difficulty and ambulates with antalgic gait, using single prong cane for support of RLE symptoms. Normal  range of motion, no joint effusions. No gross deformities Neurological: She is alert and oriented to person, place, and time. No cranial nerve deficit. Coordination, balance, strength, and speech. Skin: Skin is warm and dry. No rash noted. No erythema.  Psychiatric: She has a normal mood and affect. Her behavior is normal. Judgment and thought content normal.    Lab Results  Component Value Date   WBC 6.4 09/24/2013   HGB 13.4 09/24/2013   HCT 41.1 09/24/2013   PLT 204 09/24/2013   GLUCOSE 79 09/27/2013   CHOL 222* 08/30/2012   TRIG 93.0 08/30/2012   HDL 56.00 08/30/2012    LDLDIRECT 160.5 08/30/2012   ALT 22 09/27/2013   AST 20 09/27/2013   NA 139 09/27/2013   K 4.2 09/27/2013   CL 102 09/27/2013   CREATININE 1.1 09/27/2013   BUN 9 09/27/2013   CO2 29 09/27/2013   TSH 1.19 09/27/2013   INR 1.07 04/30/2010    Dg Chest 2 View  09/24/2013   CLINICAL DATA:  Left chest pain.  Smoker.  EXAM: CHEST  2 VIEW  COMPARISON:  05/12/2009  FINDINGS: Stable normal sized heart and clear lungs. Mild diffuse peribronchial thickening and accentuation of the interstitial markings with mild progression. Mild thoracic spine degenerative changes. Cholecystectomy clips.  IMPRESSION: No acute abnormality. Mildly progressive chronic bronchitic changes compatible with the history of smoking.   Electronically Signed   By: Enrique Sack M.D.   On: 09/24/2013 19:43   Ct Angio Chest Pe W/cm &/or Wo Cm  09/25/2013   CLINICAL DATA:  Left chest pain.  Elevated D-dimer.  EXAM: CT ANGIOGRAPHY CHEST WITH CONTRAST  TECHNIQUE: Multidetector CT imaging of the chest was performed using the standard protocol during bolus administration of intravenous contrast. Multiplanar CT image reconstructions and MIPs were obtained to evaluate the vascular anatomy.  CONTRAST:  152mL OMNIPAQUE IOHEXOL 350 MG/ML SOLN  COMPARISON:  CXR 09/24/2013  FINDINGS: Negative for pulmonary embolism. Negative for aortic aneurysm or dissection. Heart size normal. Negative for pericardial effusion.  The lungs are clear. Negative for pneumonia. Negative for pleural effusion. Negative for mass or adenopathy.  Review of the MIP images confirms the above findings.  IMPRESSION: Negative   Electronically Signed   By: Franchot Gallo M.D.   On: 09/25/2013 01:09       Assessment & Plan:   CPX/z00.00 - Patient has been counseled on age-appropriate routine health concerns for screening and prevention. These are reviewed and up-to-date. Immunizations are up-to-date or declined. Labs ordered and reviewed.

## 2014-01-28 NOTE — Patient Instructions (Addendum)
It was good to see you today.  We have reviewed your prior records including labs and tests today  Health Maintenance reviewed - annual flu immunization and 5 year Pneumovax (pneumonia vaccine #1) administered today. All other recommended immunizations and age-appropriate screenings are up-to-date.  Test(s) ordered today. Your results will be released to MyChart (or called to you) after review, usually within 72hours after test completion. If any changes need to be made, you will be notified at that same time.  Medications reviewed and updated, no changes recommended at this time. Refills provided  we'll make referral to  in clinic for your right-sided lumbar radicular pain symptoms to consider RFA or botox if needed. Our office will contact you regarding appointment(s) once made.  Please schedule followup in 6 months for semiannual exam, call sooner if problems.  Health Maintenance Adopting a healthy lifestyle and getting preventive care can go a long way to promote health and wellness. Talk with your health care provider about what schedule of regular examinations is right for you. This is a good chance for you to check in with your provider about disease prevention and staying healthy. In between checkups, there are plenty of things you can do on your own. Experts have done a lot of research about which lifestyle changes and preventive measures are most likely to keep you healthy. Ask your health care provider for more information. WEIGHT AND DIET  Eat a healthy diet  Be sure to include plenty of vegetables, fruits, low-fat dairy products, and lean protein.  Do not eat a lot of foods high in solid fats, added sugars, or salt.  Get regular exercise. This is one of the most important things you can do for your health.  Most adults should exercise for at least 150 minutes each week. The exercise should increase your heart rate and make you sweat (moderate-intensity exercise).  Most  adults should also do strengthening exercises at least twice a week. This is in addition to the moderate-intensity exercise.  Maintain a healthy weight  Body mass index (BMI) is a measurement that can be used to identify possible weight problems. It estimates body fat based on height and weight. Your health care provider can help determine your BMI and help you achieve or maintain a healthy weight.  For females 56 years of age and older:   A BMI below 18.5 is considered underweight.  A BMI of 18.5 to 24.9 is normal.  A BMI of 25 to 29.9 is considered overweight.  A BMI of 30 and above is considered obese.  Watch levels of cholesterol and blood lipids  You should start having your blood tested for lipids and cholesterol at 49 years of age, then have this test every 5 years.  You may need to have your cholesterol levels checked more often if:  Your lipid or cholesterol levels are high.  You are older than 49 years of age.  You are at high risk for heart disease.  CANCER SCREENING   Lung Cancer  Lung cancer screening is recommended for adults 72-72 years old who are at high risk for lung cancer because of a history of smoking.  A yearly low-dose CT scan of the lungs is recommended for people who:  Currently smoke.  Have quit within the past 15 years.  Have at least a 30-pack-year history of smoking. A pack year is smoking an average of one pack of cigarettes a day for 1 year.  Yearly screening should continue until  it has been 15 years since you quit.  Yearly screening should stop if you develop a health problem that would prevent you from having lung cancer treatment.  Breast Cancer  Practice breast self-awareness. This means understanding how your breasts normally appear and feel.  It also means doing regular breast self-exams. Let your health care provider know about any changes, no matter how small.  If you are in your 20s or 30s, you should have a clinical  breast exam (CBE) by a health care provider every 1-3 years as part of a regular health exam.  If you are 55 or older, have a CBE every year. Also consider having a breast X-ray (mammogram) every year.  If you have a family history of breast cancer, talk to your health care provider about genetic screening.  If you are at high risk for breast cancer, talk to your health care provider about having an MRI and a mammogram every year.  Breast cancer gene (BRCA) assessment is recommended for women who have family members with BRCA-related cancers. BRCA-related cancers include:  Breast.  Ovarian.  Tubal.  Peritoneal cancers.  Results of the assessment will determine the need for genetic counseling and BRCA1 and BRCA2 testing. Cervical Cancer Routine pelvic examinations to screen for cervical cancer are no longer recommended for nonpregnant women who are considered low risk for cancer of the pelvic organs (ovaries, uterus, and vagina) and who do not have symptoms. A pelvic examination may be necessary if you have symptoms including those associated with pelvic infections. Ask your health care provider if a screening pelvic exam is right for you.   The Pap test is the screening test for cervical cancer for women who are considered at risk.  If you had a hysterectomy for a problem that was not cancer or a condition that could lead to cancer, then you no longer need Pap tests.  If you are older than 65 years, and you have had normal Pap tests for the past 10 years, you no longer need to have Pap tests.  If you have had past treatment for cervical cancer or a condition that could lead to cancer, you need Pap tests and screening for cancer for at least 20 years after your treatment.  If you no longer get a Pap test, assess your risk factors if they change (such as having a new sexual partner). This can affect whether you should start being screened again.  Some women have medical problems that  increase their chance of getting cervical cancer. If this is the Vanderberg for you, your health care provider may recommend more frequent screening and Pap tests.  The human papillomavirus (HPV) test is another test that may be used for cervical cancer screening. The HPV test looks for the virus that can cause cell changes in the cervix. The cells collected during the Pap test can be tested for HPV.  The HPV test can be used to screen women 33 years of age and older. Getting tested for HPV can extend the interval between normal Pap tests from three to five years.  An HPV test also should be used to screen women of any age who have unclear Pap test results.  After 49 years of age, women should have HPV testing as often as Pap tests.  Colorectal Cancer  This type of cancer can be detected and often prevented.  Routine colorectal cancer screening usually begins at 49 years of age and continues through 49 years of age.  Your health care provider may recommend screening at an earlier age if you have risk factors for colon cancer.  Your health care provider may also recommend using home test kits to check for hidden blood in the stool.  A small camera at the end of a tube can be used to examine your colon directly (sigmoidoscopy or colonoscopy). This is done to check for the earliest forms of colorectal cancer.  Routine screening usually begins at age 84.  Direct examination of the colon should be repeated every 5-10 years through 49 years of age. However, you may need to be screened more often if early forms of precancerous polyps or small growths are found. Skin Cancer  Check your skin from head to toe regularly.  Tell your health care provider about any new moles or changes in moles, especially if there is a change in a mole's shape or color.  Also tell your health care provider if you have a mole that is larger than the size of a pencil eraser.  Always use sunscreen. Apply sunscreen  liberally and repeatedly throughout the day.  Protect yourself by wearing long sleeves, pants, a wide-brimmed hat, and sunglasses whenever you are outside. HEART DISEASE, DIABETES, AND HIGH BLOOD PRESSURE   Have your blood pressure checked at least every 1-2 years. High blood pressure causes heart disease and increases the risk of stroke.  If you are between 34 years and 30 years old, ask your health care provider if you should take aspirin to prevent strokes.  Have regular diabetes screenings. This involves taking a blood sample to check your fasting blood sugar level.  If you are at a normal weight and have a low risk for diabetes, have this test once every three years after 49 years of age.  If you are overweight and have a high risk for diabetes, consider being tested at a younger age or more often. PREVENTING INFECTION  Hepatitis B  If you have a higher risk for hepatitis B, you should be screened for this virus. You are considered at high risk for hepatitis B if:  You were born in a country where hepatitis B is common. Ask your health care provider which countries are considered high risk.  Your parents were born in a high-risk country, and you have not been immunized against hepatitis B (hepatitis B vaccine).  You have HIV or AIDS.  You use needles to inject street drugs.  You live with someone who has hepatitis B.  You have had sex with someone who has hepatitis B.  You get hemodialysis treatment.  You take certain medicines for conditions, including cancer, organ transplantation, and autoimmune conditions. Hepatitis C  Blood testing is recommended for:  Everyone born from [birth year] through 1966.  Anyone with known risk factors for hepatitis C. Sexually transmitted infections (STIs)  You should be screened for sexually transmitted infections (STIs) including gonorrhea and chlamydia if:  You are sexually active and are younger than 49 years of age.  You are older than  50 years of age and your health care provider tells you that you are at risk for this type of infection.  Your sexual activity has changed since you were last screened and you are at an increased risk for chlamydia or gonorrhea. Ask your health care provider if you are at risk.  If you do not have HIV, but are at risk, it may be recommended that you take a prescription medicine daily to prevent HIV infection. This is  called pre-exposure prophylaxis (PrEP). You are considered at risk if:  You are sexually active and do not regularly use condoms or know the HIV status of your partner(s).  You take drugs by injection.  You are sexually active with a partner who has HIV. Talk with your health care provider about whether you are at high risk of being infected with HIV. If you choose to begin PrEP, you should first be tested for HIV. You should then be tested every 3 months for as long as you are taking PrEP.  PREGNANCY   If you are premenopausal and you may become pregnant, ask your health care provider about preconception counseling.  If you may become pregnant, take 400 to 800 micrograms (mcg) of folic acid every day.  If you want to prevent pregnancy, talk to your health care provider about birth control (contraception). OSTEOPOROSIS AND MENOPAUSE   Osteoporosis is a disease in which the bones lose minerals and strength with aging. This can result in serious bone fractures. Your risk for osteoporosis can be identified using a bone density scan.  If you are 42 years of age or older, or if you are at risk for osteoporosis and fractures, ask your health care provider if you should be screened.  Ask your health care provider whether you should take a calcium or vitamin D supplement to lower your risk for osteoporosis.  Menopause may have certain physical symptoms and risks.  Hormone replacement therapy may reduce some of these symptoms and risks. Talk to your health care provider about  whether hormone replacement therapy is right for you.  HOME CARE INSTRUCTIONS   Schedule regular health, dental, and eye exams.  Stay current with your immunizations.   Do not use any tobacco products including cigarettes, chewing tobacco, or electronic cigarettes.  If you are pregnant, do not drink alcohol.  If you are breastfeeding, limit how much and how often you drink alcohol.  Limit alcohol intake to no more than 1 drink per day for nonpregnant women. One drink equals 12 ounces of beer, 5 ounces of wine, or 1 ounces of hard liquor.  Do not use street drugs.  Do not share needles.  Ask your health care provider for help if you need support or information about quitting drugs.  Tell your health care provider if you often feel depressed.  Tell your health care provider if you have ever been abused or do not feel safe at home. Document Released: 08/24/2010 Document Revised: 06/25/2013 Document Reviewed: 01/10/2013 [facility] Patient Information 2015 [place], [place]. This information is not intended to replace advice given to you by your health care provider. Make sure you discuss any questions you have with your health care provider.

## 2014-01-28 NOTE — Assessment & Plan Note (Signed)
Wt Readings from Last 3 Encounters:  01/28/14 238 lb 8 oz (108.183 kg)  09/27/13 244 lb 4 oz (110.791 kg)  09/24/13 243 lb 2.7 oz (110.3 kg)   The patient is asked to make an attempt to improve diet and exercise patterns to aid in medical management of this problem.

## 2014-01-28 NOTE — Progress Notes (Signed)
Pre visit review using our clinic review tool, if applicable. No additional management support is needed unless otherwise documented below in the visit note. 

## 2014-01-28 NOTE — Assessment & Plan Note (Signed)
RLE and pelvic pain symptoms, ongoing since 03/2010 with pelvic surgery and urologic injury periop status post uro eval (grapey) at time of injury - pr reports "nothing more to do per urology"; then new R ovary cyst on 10/05/10 CT, not seen on  07/10/10 CT - s/p new gyn (neal) for eval same >>felt to be endometriosis contributing to current pain symptoms  s/p eval at [facility] gyn> s/p BSO 03/2011 due to pain symptoms  Started gabapentin for neuropathic component of pain in R pelvic region 09/2010 - improved but not resolved  Late 2012 started on dilaudid through pain clinic managed by Dr. Thomasenia Bottoms for same (known to pt though his care of her mom for RSD), but dismissed summer 2014 when [facility] left practice Pain clinic referral still pending (no one taking "pelvic pain" per pt report, no uninsured with unemployment), will re enter as right lumbar sacral radicular pain resumed lower dose Dilaudid 11/2012 pending establishment with pain clinic Weaned down on dose 01/2013 (q8h, not q4h - #90, not #120/mo) -no further wean at this time changed XR gabapentin (Gralise) to generic gabapentin and increased dose 05/2013 - continue same Has been working on disability -was advised to consider RFA or Botox injection by pain clinic. We'll make the referral for same at this time to consider alternate treatment of right lumbosacral radiculopathy

## 2014-01-31 ENCOUNTER — Other Ambulatory Visit: Payer: Self-pay | Admitting: Internal Medicine

## 2014-03-06 ENCOUNTER — Telehealth: Payer: Self-pay | Admitting: Internal Medicine

## 2014-03-06 NOTE — Telephone Encounter (Signed)
Pt needs refill of Diladin, pls advise.

## 2014-03-08 ENCOUNTER — Telehealth: Payer: Self-pay

## 2014-03-08 MED ORDER — HYDROMORPHONE HCL 2 MG PO TABS: 2.0000 mg | ORAL_TABLET | Freq: Three times a day (TID) | ORAL | Status: DC | PRN

## 2014-03-08 NOTE — Telephone Encounter (Signed)
Ok to print Dilaudid for refill - I will sign later this afternoon Will need a repeat UDS before picking up this rx

## 2014-03-08 NOTE — Telephone Encounter (Signed)
Patient has called back in regards  °

## 2014-03-08 NOTE — Telephone Encounter (Signed)
Pt called and informed that rx is ready for pick up in the office.   UDS required to pick up, per PCP

## 2014-03-08 NOTE — Telephone Encounter (Signed)
Pt came in to pick up rx and UDS was collected.

## 2014-03-08 NOTE — Telephone Encounter (Signed)
Sent message to PCP for approval

## 2014-03-13 ENCOUNTER — Telehealth: Payer: Self-pay

## 2014-03-13 NOTE — Telephone Encounter (Signed)
Called pt per PCP:  RE: toxicology report came negative for diluadid  Pt stated that it is due to running out of rx on Monday prior to picking up.   Last pick up was on 01/28/2014

## 2014-03-14 NOTE — Telephone Encounter (Signed)
LVM for pt to call back.

## 2014-03-14 NOTE — Telephone Encounter (Signed)
Noted - thanks for clarifying Please let pt know we will continue to rx dilaudid at current dosing and will continue to monitor for compliance, random and scheduled intervals thanks

## 2014-04-08 ENCOUNTER — Telehealth: Payer: Self-pay | Admitting: Internal Medicine

## 2014-04-08 ENCOUNTER — Other Ambulatory Visit: Payer: Self-pay

## 2014-04-08 MED ORDER — HYDROMORPHONE HCL 2 MG PO TABS: 2.0000 mg | ORAL_TABLET | Freq: Three times a day (TID) | ORAL | Status: DC | PRN

## 2014-04-08 NOTE — Telephone Encounter (Signed)
Pt request refill for HYDROmorphone (DILAUDID) 2 MG tablet.

## 2014-04-09 ENCOUNTER — Encounter: Payer: Self-pay | Admitting: Physical Medicine & Rehabilitation

## 2014-04-16 ENCOUNTER — Ambulatory Visit (HOSPITAL_COMMUNITY)
Admission: RE | Admit: 2014-04-16 | Discharge: 2014-04-16 | Disposition: A | Discharge status: Home / Self Care | Payer: Self-pay | Source: Ambulatory Visit | Attending: Physical Medicine & Rehabilitation | Admitting: Physical Medicine & Rehabilitation

## 2014-04-16 ENCOUNTER — Other Ambulatory Visit: Payer: Self-pay | Admitting: Physical Medicine & Rehabilitation

## 2014-04-16 ENCOUNTER — Ambulatory Visit (HOSPITAL_BASED_OUTPATIENT_CLINIC_OR_DEPARTMENT_OTHER): Payer: Self-pay | Admitting: Physical Medicine & Rehabilitation

## 2014-04-16 ENCOUNTER — Encounter: Payer: Self-pay | Attending: Physical Medicine & Rehabilitation

## 2014-04-16 ENCOUNTER — Encounter: Payer: Self-pay | Admitting: Physical Medicine & Rehabilitation

## 2014-04-16 VITALS — BP 124/56 | HR 82 | Resp 14

## 2014-04-16 DIAGNOSIS — M25551 Pain in right hip: Secondary | ICD-10-CM

## 2014-04-16 DIAGNOSIS — G8929 Other chronic pain: Secondary | ICD-10-CM

## 2014-04-16 DIAGNOSIS — G894 Chronic pain syndrome: Secondary | ICD-10-CM

## 2014-04-16 DIAGNOSIS — Z79899 Other long term (current) drug therapy: Secondary | ICD-10-CM

## 2014-04-16 DIAGNOSIS — M792 Neuralgia and neuritis, unspecified: Secondary | ICD-10-CM

## 2014-04-16 DIAGNOSIS — G588 Other specified mononeuropathies: Secondary | ICD-10-CM | POA: Insufficient documentation

## 2014-04-16 DIAGNOSIS — R1030 Lower abdominal pain, unspecified: Secondary | ICD-10-CM | POA: Insufficient documentation

## 2014-04-16 DIAGNOSIS — Z5181 Encounter for therapeutic drug level monitoring: Secondary | ICD-10-CM

## 2014-04-16 MED ORDER — TRAMADOL HCL 50 MG PO TABS: 50.0000 mg | ORAL_TABLET | Freq: Four times a day (QID) | ORAL | Status: DC | PRN

## 2014-04-16 NOTE — Patient Instructions (Signed)
Iliohypogastric nerve block under ultrasound and electrical stim guidance

## 2014-04-16 NOTE — Progress Notes (Signed)
Subjective:    Patient ID: [name] White, female    DOB: 1965-04-20, 49 y.o.   MRN: [id number]  HPI Right hip pain and groin pain as well as buttocks and Right anterolateral thigh  Went to PT Integrative therapy some relief- massage, TENS,  Advised to have botox injection or radiofreq ablation Pain Inventory Average Pain 8 Pain Right Now 7 My pain is constant, dull, aching and throbbing  In the last 24 hours, has pain interfered with the following? General activity 7 Relation with others 7 Enjoyment of life 9 What TIME of day is your pain at its worst? ALL Sleep (in general) Poor  Pain is worse with: walking, bending, sitting, inactivity, standing and some activites Pain improves with: medication Relief from Meds: 7  Mobility walk without assistance how many minutes can you walk? 5 ability to climb steps?  no do you drive?  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  no  Physicians involved in your care Any changes since last visit?  no   Family History  Problem Relation Age of Onset  . COPD Mother   . Stroke Mother   . Coronary artery disease Mother     pacer  . Colon polyps Mother   . Breast cancer Mother   . Hypertension Father   . Colon polyps Brother   . Colon cancer Maternal Grandfather   . Colon polyps Maternal Grandfather   . Breast cancer Maternal Aunt   . Uterine cancer Cousin     x 2   History   Social History  . Marital Status: Divorced    Spouse Name: N/A  . Number of Children: 2  . Years of Education: N/A   Occupational History  . unemployed    Social History Main Topics  . Smoking status: Current Every Day Smoker -- 0.50 packs/day for 30 years    Types: Cigarettes  . Smokeless tobacco: Never Used   Comment: BUT NOT READY  . Alcohol Use: No  . Drug Use: No  . Sexual Activity: Not on file   Other Topics Concern  . 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 Wrist surgery      right  . Cesarean section      x 2  . Cholecystectomy    . Tubal ligation    . Plantar fascitis      right foot  . Abdominal hysterectomy      secondary to endometriosis  . Laparoscopy  04-22-10    lysis of adhesions  . Elbow surgery      left  . Salpingectomy  04-22-10    right  . Laparoscopy  04-30-10     with mini laparotomy and cystotomy repair  . Colonoscopy w/ polypectomy  2012   Past Medical History  Diagnosis Date  . Depression   . Barrett's esophagus     egd 2007 + 2009 but none 2011 or 08/2010 egd  . Colon polyp     hyperplastic  . Grief reaction   . Chronic headache   .  Tobacco abuse   . Migraine   . Restless leg   . IBS (irritable bowel syndrome)   . Hidradenitis suppurativa   . Ovarian cyst   . OSA (obstructive sleep apnea)    BP 124/56 mmHg  Pulse 82  Resp 14  SpO2 99%  Opioid Risk Score:   Fall Risk Score: Moderate Fall Risk (6-13 points)  Review of Systems  Constitutional: Positive for appetite change.       Night sweats, poor appetite  HENT: Negative.   Eyes: Negative.   Respiratory: Positive for apnea and shortness of breath.        Sleep apnea - does not use CPAP  Cardiovascular: Negative.   Gastrointestinal: Positive for nausea.       Bowel control problems  Endocrine: Negative.   Genitourinary: Negative.        Bladder control problems  Musculoskeletal:       Right hip pain  Skin: Negative.   Allergic/Immunologic: Negative.   Neurological: Positive for weakness and numbness.       Trouble walking, spasms  Hematological: Negative.   Psychiatric/Behavioral: Positive  for confusion and dysphoric mood. The patient is nervous/anxious.        Objective:   Physical Exam  Constitutional: She is oriented to person, place, and time.  obese  HENT:  Head: Normocephalic and atraumatic.  Eyes: Conjunctivae and EOM are normal. Pupils are equal, round, and reactive to light.  Neck: Normal range of motion.  Musculoskeletal:       Right hip: She exhibits decreased range of motion and tenderness. She exhibits no deformity.  Decreased right hip internal/external rotation  Neurological: She is alert and oriented to person, place, and time. She has normal strength. She displays no atrophy. A sensory deficit is present. She exhibits normal muscle tone. Gait normal.  Psychiatric: Her behavior is normal. Judgment and thought content normal.  Nursing note and vitals reviewed.   Motor strength is 5/5 bilateral hip flexors knee extensors ankle dorsiflexors and plantar flexor Sensation intact in bilateral L3-L4 L5-S1 distribution Lumbar spine has full range of motion with flexion but does have reduced extension and lateral bending bilaterally. Straight leg raising test is negative There is numbness in the right inguinal fold to pinprick with hyperalgesia just inferior to the left inguinal fold. Healed surgical scar right transverse and midline non-tender, well-healed, no open areas Mood and affect are Labile  Right hip x-ray demonstrates reduced joint space, acetabular spur lateral, subchondral sclerosis    Assessment & Plan:  1. 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 We'll schedule for ilio-hypogastric nerve block under ultrasound and electrical stimulation guidance, If the patient only gets a short-term relief with the nerve block may be a candidate for cryoablation  Also recommend discontinuation of Dilaudid. She takes 2 mg 3  times a day so I do not anticipate any withdrawal type symptoms Will institute tramadol 50 g 4 times a day in place of  Also has right hip joint pain with osteoarthritis seen on x-ray, would recommend a fluoroscopic guided hip injection  Also suspect chronic pelvic pain with pelvic myofascial dysfunction, Will need specialized pelvic physical therapy at some point

## 2014-04-17 LAB — PMP ALCOHOL METABOLITE (ETG): Ethyl Glucuronide (EtG): NEGATIVE ng/mL

## 2014-04-20 LAB — PRESCRIPTION MONITORING PROFILE (SOLSTAS)
Amphetamine/Meth: NEGATIVE ng/mL
BARBITURATE SCREEN, URINE: NEGATIVE ng/mL
BUPRENORPHINE, URINE: NEGATIVE ng/mL
CARISOPRODOL, URINE: NEGATIVE ng/mL
Cannabinoid Scrn, Ur: NEGATIVE ng/mL
Cocaine Metabolites: NEGATIVE ng/mL
Creatinine, Urine: 187.3 mg/dL (ref >20.0)
FENTANYL URINE: NEGATIVE ng/mL
MDMA URINE: NEGATIVE ng/mL
Meperidine, Ur: NEGATIVE ng/mL
Methadone Screen, Urine: NEGATIVE ng/mL
Nitrites, Initial: NEGATIVE ug/mL
Oxycodone Screen, Ur: NEGATIVE ng/mL
PH URINE, INITIAL: 5.1 pH (ref 4.5–8.9)
Propoxyphene: NEGATIVE ng/mL
Tapentadol, urine: NEGATIVE ng/mL
Tramadol Scrn, Ur: NEGATIVE ng/mL
Zolpidem, Urine: NEGATIVE ng/mL

## 2014-04-20 LAB — OPIATES/OPIOIDS (LC/MS-MS)
Codeine Urine: NEGATIVE ng/mL (ref <50)
Hydrocodone: NEGATIVE ng/mL (ref <50)
Hydromorphone: 2890 ng/mL (ref <50)
Morphine Urine: NEGATIVE ng/mL (ref <50)
NORHYDROCODONE, UR: NEGATIVE ng/mL (ref <50)
Noroxycodone, Ur: NEGATIVE ng/mL (ref <50)
OXYCODONE, UR: NEGATIVE ng/mL (ref <50)
Oxymorphone: NEGATIVE ng/mL (ref <50)

## 2014-04-20 LAB — BENZODIAZEPINES (GC/LC/MS), URINE
ALPRAZOLAMU: 679 ng/mL — AB (ref <25)
Clonazepam metabolite (GC/LC/MS), ur confirm: NEGATIVE ng/mL (ref <25)
Flurazepam metabolite (GC/LC/MS), ur confirm: NEGATIVE ng/mL (ref <50)
Lorazepam (GC/LC/MS), ur confirm: NEGATIVE ng/mL (ref <50)
Midazolam (GC/LC/MS), ur confirm: NEGATIVE ng/mL (ref <50)
Nordiazepam (GC/LC/MS), ur confirm: NEGATIVE ng/mL (ref <50)
Oxazepam (GC/LC/MS), ur confirm: NEGATIVE ng/mL (ref <50)
TEMAZEPAMU: NEGATIVE ng/mL (ref <50)
TRIAZOLAMU: NEGATIVE ng/mL (ref <50)

## 2014-04-22 ENCOUNTER — Telehealth: Payer: Self-pay | Admitting: *Deleted

## 2014-04-22 NOTE — Telephone Encounter (Signed)
[name] White calling about the results of her x ray 04/16/14

## 2014-04-23 NOTE — Telephone Encounter (Signed)
Some evidence of osteoarthritis RIght hip, not severe, do not rec surgery, may benefit from therapy as well as injection of cortisone under fluoro guidance

## 2014-04-23 NOTE — Telephone Encounter (Signed)
Left message to call office back since no name on voicemail.

## 2014-04-23 NOTE — Telephone Encounter (Signed)
[name] White called back and I gave her the message on her results of her hip x ray.  She will discuss further at the appt 05/10/14 with Dr Letta Pate.

## 2014-04-26 NOTE — Progress Notes (Signed)
Urine drug screen for this encounter is consistent for prescribed medication 

## 2014-05-03 ENCOUNTER — Encounter: Payer: Self-pay | Admitting: Internal Medicine

## 2014-05-10 ENCOUNTER — Encounter: Payer: Self-pay | Attending: Physical Medicine & Rehabilitation

## 2014-05-10 ENCOUNTER — Encounter: Payer: Self-pay | Admitting: Physical Medicine & Rehabilitation

## 2014-05-10 ENCOUNTER — Ambulatory Visit (HOSPITAL_BASED_OUTPATIENT_CLINIC_OR_DEPARTMENT_OTHER): Payer: Self-pay | Admitting: Physical Medicine & Rehabilitation

## 2014-05-10 VITALS — BP 148/88 | HR 78 | Resp 14

## 2014-05-10 DIAGNOSIS — M792 Neuralgia and neuritis, unspecified: Secondary | ICD-10-CM

## 2014-05-10 DIAGNOSIS — Z79899 Other long term (current) drug therapy: Secondary | ICD-10-CM | POA: Insufficient documentation

## 2014-05-10 DIAGNOSIS — Z5181 Encounter for therapeutic drug level monitoring: Secondary | ICD-10-CM | POA: Insufficient documentation

## 2014-05-10 DIAGNOSIS — G588 Other specified mononeuropathies: Secondary | ICD-10-CM

## 2014-05-10 DIAGNOSIS — M25551 Pain in right hip: Secondary | ICD-10-CM | POA: Insufficient documentation

## 2014-05-10 DIAGNOSIS — G894 Chronic pain syndrome: Secondary | ICD-10-CM | POA: Insufficient documentation

## 2014-05-10 DIAGNOSIS — G8929 Other chronic pain: Secondary | ICD-10-CM | POA: Insufficient documentation

## 2014-05-10 NOTE — Progress Notes (Signed)
Iliohypogastric nerve block ultrasound-guided  Patient placed in a supine position Linear transducer placed over the right ASIS medial aspect. External oblique, internal oblique and transversus abdominis identified. A 25-gauge 1.5 inch needle was used to anesthetize the skin and subcutaneous tissue after Betadine preparation and sterile drape. The fascial plane between internal oblique and transversus abdominis was targeted.Doppler imaging used, no evidence of arterial blood flow in the injection site area Needle tip entered the fascial plane, Solution containing 1 cc of 40 mg/cc of Celestone and 5 cc of 1% lidocaine injected. No immediate postprocedure complications. Discussed potential postprocedure compensations Postprocedure right lateral thigh numbness, no groin numbness

## 2014-05-10 NOTE — Patient Instructions (Signed)
Iliohypogastric neve block ultrasound guided  May have groin numbness You may have bruising Call if you experience proressive worsening of abdominal pain

## 2014-05-16 ENCOUNTER — Ambulatory Visit: Payer: Self-pay | Admitting: Physical Medicine & Rehabilitation

## 2014-06-05 ENCOUNTER — Encounter: Payer: Self-pay | Admitting: Internal Medicine

## 2014-06-05 ENCOUNTER — Ambulatory Visit (INDEPENDENT_AMBULATORY_CARE_PROVIDER_SITE_OTHER): Payer: Self-pay | Admitting: Internal Medicine

## 2014-06-05 ENCOUNTER — Other Ambulatory Visit (INDEPENDENT_AMBULATORY_CARE_PROVIDER_SITE_OTHER): Payer: Self-pay

## 2014-06-05 VITALS — BP 120/76 | HR 75 | Temp 98.3°F | Ht 66.0 in | Wt 223.8 lb

## 2014-06-05 DIAGNOSIS — R300 Dysuria: Secondary | ICD-10-CM

## 2014-06-05 LAB — URINALYSIS, ROUTINE W REFLEX MICROSCOPIC
Bilirubin Urine: NEGATIVE
Ketones, ur: NEGATIVE
Nitrite: POSITIVE — AB
RBC / HPF: NONE SEEN (ref 0–?)
SPECIFIC GRAVITY, URINE: 1.015 (ref 1.000–1.030)
Urine Glucose: NEGATIVE
Urobilinogen, UA: 1 (ref 0.0–1.0)
pH: 8 (ref 5.0–8.0)

## 2014-06-05 MED ORDER — NITROFURANTOIN MONOHYD MACRO 100 MG PO CAPS: 100.0000 mg | ORAL_CAPSULE | Freq: Two times a day (BID) | ORAL | Status: DC

## 2014-06-05 MED ORDER — PHENAZOPYRIDINE HCL 200 MG PO TABS: 200.0000 mg | ORAL_TABLET | Freq: Three times a day (TID) | ORAL | Status: DC | PRN

## 2014-06-05 NOTE — Progress Notes (Signed)
   Subjective:    Patient ID: [name] White, female    DOB: [birth year], 49 y.o.   MRN: [id number]  HPI  Her symptoms began 06/01/14 as sense of urgency which woke her. She was unable to urinate initially. The hesitancy and oliguria persisted in addition to symptoms of urgency and frequency. She was having more pain than burning. This was associated with sweats. She also had nocturia 1-2 times per night.  She took Azo with some relief temporarily. The symptoms listed above persist.  She's had no recent antibiotics. She states that she's had recurrent urinary tract infections. 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   Review of Systems  She denies hematuria or pyuria.  She's not having flank pain.  She also denies fever or chills.    Objective:   Physical Exam Pertinent or positive findings include: BMI: 36.13 She has pain in the right hip with range of motion of the lower extremity and straight leg raising.  She has bilateral crepitus of the knees.   There are bland scattered hyperpigmentation scars over the upper extremities.  General appearance :adequately nourished; in no distress. Eyes: No conjunctival inflammation or scleral icterus is present. Heart:  Normal rate and regular rhythm. S1 and S2 normal without gallop, murmur, click, rub or other extra sounds   Lungs:Chest clear to auscultation; no wheezes, rhonchi,rales ,or rubs present.No increased work of breathing.  Abdomen: bowel sounds normal, soft and non-tender without masses, organomegaly or hernias noted.  No guarding or rebound. No flank tenderness to percussion. Vascular : all pulses equal ; no bruits present. Skin:Warm & dry.  Intact without suspicious lesions or rashes ; no tenting or jaundice  Lymphatic: No lymphadenopathy is noted about the head, neck, axilla Neuro: Strength, tone &  DTRs normal.       Assessment & Plan:  #1 dysuria, probable urinary tract infection  Plan: As per standard of care she'll be placed on nitrofurantoin pending results of the culture and sensitivity.

## 2014-06-05 NOTE — Patient Instructions (Signed)
Drink as much nondairy fluids as possible. Avoid spicy foods or alcohol as  these may aggravate the bladder. Do not take decongestants. Avoid narcotics if possible. 

## 2014-06-05 NOTE — Progress Notes (Signed)
Pre visit review using our clinic review tool, if applicable. No additional management support is needed unless otherwise documented below in the visit note. 

## 2014-06-08 LAB — URINE CULTURE

## 2014-06-11 ENCOUNTER — Ambulatory Visit: Payer: Self-pay | Admitting: Physical Medicine & Rehabilitation

## 2014-06-20 ENCOUNTER — Other Ambulatory Visit: Payer: Self-pay | Admitting: Physical Medicine & Rehabilitation

## 2014-06-20 NOTE — Telephone Encounter (Signed)
Do you want to refill this?  Self pay and last appt for injection cancelled due to cost and nothing scheduled at this time.

## 2014-06-20 NOTE — Telephone Encounter (Signed)
Called tramadol refill to pharmacy.  I left message any further refills would need an appointment

## 2014-07-09 ENCOUNTER — Ambulatory Visit (INDEPENDENT_AMBULATORY_CARE_PROVIDER_SITE_OTHER): Payer: Self-pay | Admitting: Family Medicine

## 2014-07-09 ENCOUNTER — Telehealth: Payer: Self-pay | Admitting: Internal Medicine

## 2014-07-09 ENCOUNTER — Encounter: Payer: Self-pay | Admitting: Family Medicine

## 2014-07-09 VITALS — BP 112/91 | HR 111 | Temp 98.6°F | Ht 66.0 in | Wt 222.0 lb

## 2014-07-09 DIAGNOSIS — N390 Urinary tract infection, site not specified: Secondary | ICD-10-CM

## 2014-07-09 DIAGNOSIS — R350 Frequency of micturition: Secondary | ICD-10-CM

## 2014-07-09 LAB — POCT URINALYSIS DIPSTICK
Bilirubin, UA: NEGATIVE
Glucose, UA: NEGATIVE
Ketones, UA: NEGATIVE
Leukocytes, UA: NEGATIVE
Protein, UA: NEGATIVE
Spec Grav, UA: 1.01
Urobilinogen, UA: 0.2
pH, UA: 8

## 2014-07-09 MED ORDER — SULFAMETHOXAZOLE-TRIMETHOPRIM 800-160 MG PO TABS: 1.0000 | ORAL_TABLET | Freq: Two times a day (BID) | ORAL | Status: DC

## 2014-07-09 NOTE — Progress Notes (Signed)
   Subjective:    Patient ID: [name] White, female    DOB: 1966-01-05, 49 y.o.   MRN: [id number]  HPI Here for 6 weeks of recurrent UTI symptoms including urinary urgency and burning. She has been nauseated but has not vomited. She has some middle back pain now as well. She has intermittent fevers. She is drinking plenty of fluids. She was seen on 06-05-14 and was given a 10 day course of Macrobid. Her culture that day grew an E coli that was sensitive to Macrobid, and she did feel better for a week. Then the same symptoms reappeared and they have been present ever since.    Review of Systems  Constitutional: Positive for fever and chills.  Respiratory: Negative.   Cardiovascular: Negative.   Gastrointestinal: Negative for abdominal pain, diarrhea, constipation, blood in stool, abdominal distention and anal bleeding.  Genitourinary: Positive for dysuria, urgency, frequency and flank pain. Negative for pelvic pain.       Objective:   Physical Exam  Constitutional: She appears well-developed and well-nourished.  Pulmonary/Chest: Effort normal and breath sounds normal.  Abdominal: Soft. Bowel sounds are normal. She exhibits no distension and no mass. There is no tenderness. There is no rebound and no guarding.          Assessment & Plan:  Partially treated UTI. She had a partial response to Macrobid but if there is a pyelonephritis this may not have been sufficient to treat it. She will now take Bactrim DS. Recheck prn. Written out of work today and tomorrow.

## 2014-07-09 NOTE — Progress Notes (Signed)
Pre visit review using our clinic review tool, if applicable. No additional management support is needed unless otherwise documented below in the visit note. 

## 2014-07-09 NOTE — Telephone Encounter (Signed)
PLEASE NOTE: All timestamps contained within this report are represented as [place] Standard Time. CONFIDENTIALTY NOTICE: This fax transmission is intended only for the addressee. It contains information that is legally privileged, confidential or otherwise protected from use or disclosure. If you are not the intended recipient, you are strictly prohibited from reviewing, disclosing, copying using or disseminating any of this information or taking any action in reliance on or regarding this information. If you have received this fax in error, please notify us immediately by telephone so that we can arrange for its return to [place]. Phone: [phone], Toll-Free: [phone], Fax: [phone] Page: 1 of 1 Call Id: [id number] [facility] - [facility] [place] Patient Name: [name] White DOB: [birth year] Initial Comment Caller states is having sweats, nausea, uti times 3, all she wants to do sleep. Hot and cold aggravating her. Nurse Assessment Nurse: Marcelline Deist, RN, Lynda Date/Time (Eastern Time): 07/09/2014 1:22:43 PM Confirm and document reason for call. If symptomatic, describe symptoms. ---Caller states she is having sweats, nausea. Has had UTI times 3 since April 9th, seen the first time, not for the last 2 infections, used AZO, cranberry juice & tablets. All she wants to do is sleep. Hot and cold is alternating every 5 mins. is aggravating her. Still has pain above vagina & lower back from UTI. No fever. Has the patient traveled out of the country within the last 30 days? ---Not Applicable Does the patient require triage? ---Yes Related visit to physician within the last 2 weeks? ---No Does the PT have any chronic conditions? (i.e. diabetes, asthma, etc.) ---No Did the patient indicate they were pregnant? ---No Guidelines Guideline Title Affirmed Question Affirmed Notes Abdominal Pain - Female [1] MILD-MODERATE pain AND  [2] constant AND [3] present > 2 hours Final Disposition User See Physician within 4 Hours (or PCP triage) Marcelline Deist, RN, Kermit Balo

## 2014-07-10 ENCOUNTER — Telehealth: Payer: Self-pay | Admitting: *Deleted

## 2014-07-10 NOTE — Telephone Encounter (Signed)
[facility] Patient Name: [name] White Gender: Female DOB: [birth year] Age: 49 Y 37 M 22 D Return Phone Number: [phone] (Primary) Address: City/State/Zip: [place] [address] [facility] Client Site [place] - Day Physician Gwendolyn Grant Contact Type Call Call Type Triage / Clinical Relationship To Patient Self Appointment Disposition EMR Appointment Scheduled Info pasted into Epic Yes Return Phone Number [phone] (Primary) Chief Complaint Sweating Initial Comment Caller states is having sweats, nausea, uti times 3, all she wants to do sleep. Hot and cold aggravating her. PreDisposition Call Doctor Nurse Assessment Nurse: Marcelline Deist, RN, Kermit Balo Date/Time Eilene Ghazi Time): 07/09/2014 1:22:43 PM Confirm and document reason for call. If symptomatic, describe symptoms. ---Caller states she is having sweats, nausea. Has had UTI times 3 since April 9th, seen the first time, not for the last 2 infections, used AZO, cranberry juice & tablets. All she wants to do is sleep. Hot and cold is alternating every 5 mins. is aggravating her. Still has pain above vagina & lower back from UTI. No fever. Has the patient traveled out of the country within the last 30 days? ---Not Applicable Does the patient require triage? ---Yes Related visit to physician within the last 2 weeks? ---No Does the PT have any chronic conditions? (i.e. diabetes, asthma, etc.) ---No Did the patient indicate they were pregnant? ---No Guidelines Guideline Title Affirmed Question Affirmed Notes Nurse Date/Time (Eastern Time) Abdominal Pain - Female [1] MILD-MODERATE pain AND [2] constant AND [3] present > 2 hours Marcelline Deist, RN, Kermit Balo 07/09/2014 1:26:56 PM Disp. Time Eilene Ghazi Time) Disposition Final User 07/09/2014 1:34:45 PM See Physician within 4 Hours (or

## 2014-07-11 LAB — URINE CULTURE: Colony Count: 5000

## 2014-07-24 ENCOUNTER — Other Ambulatory Visit: Payer: Self-pay | Admitting: Physical Medicine & Rehabilitation

## 2014-07-24 ENCOUNTER — Telehealth: Payer: Self-pay | Admitting: *Deleted

## 2014-07-24 NOTE — Telephone Encounter (Signed)
[name] White called and said she needs a refill on her tramadol but is hoping it can be increased as it is not managing pain.   She is asking if she needs a driver for her hip injection 08/09/14  as well.

## 2014-07-25 MED ORDER — TRAMADOL HCL 50 MG PO TABS: 100.0000 mg | ORAL_TABLET | Freq: Three times a day (TID) | ORAL | Status: DC | PRN

## 2014-07-25 NOTE — Telephone Encounter (Signed)
May increase tramadol to 2 tabs TID, #180 No driver needed for hipinjection

## 2014-07-25 NOTE — Telephone Encounter (Signed)
Notified Laverda of med call in and driver info.

## 2014-07-29 ENCOUNTER — Ambulatory Visit: Payer: Self-pay | Admitting: Internal Medicine

## 2014-08-09 ENCOUNTER — Encounter: Payer: Self-pay | Admitting: Physical Medicine & Rehabilitation

## 2014-08-09 ENCOUNTER — Ambulatory Visit (HOSPITAL_BASED_OUTPATIENT_CLINIC_OR_DEPARTMENT_OTHER): Payer: BLUE CROSS/BLUE SHIELD | Admitting: Physical Medicine & Rehabilitation

## 2014-08-09 ENCOUNTER — Ambulatory Visit (INDEPENDENT_AMBULATORY_CARE_PROVIDER_SITE_OTHER): Payer: BLUE CROSS/BLUE SHIELD | Admitting: Internal Medicine

## 2014-08-09 ENCOUNTER — Encounter: Payer: Self-pay | Attending: Physical Medicine & Rehabilitation

## 2014-08-09 ENCOUNTER — Encounter: Payer: Self-pay | Admitting: Internal Medicine

## 2014-08-09 VITALS — BP 110/56 | HR 79 | Temp 98.5°F | Resp 16 | Wt 228.0 lb

## 2014-08-09 VITALS — BP 126/74 | HR 84 | Resp 14

## 2014-08-09 DIAGNOSIS — G8929 Other chronic pain: Secondary | ICD-10-CM | POA: Insufficient documentation

## 2014-08-09 DIAGNOSIS — E785 Hyperlipidemia, unspecified: Secondary | ICD-10-CM | POA: Diagnosis not present

## 2014-08-09 DIAGNOSIS — M25551 Pain in right hip: Secondary | ICD-10-CM | POA: Insufficient documentation

## 2014-08-09 DIAGNOSIS — Z720 Tobacco use: Secondary | ICD-10-CM

## 2014-08-09 DIAGNOSIS — F17200 Nicotine dependence, unspecified, uncomplicated: Secondary | ICD-10-CM

## 2014-08-09 DIAGNOSIS — K219 Gastro-esophageal reflux disease without esophagitis: Secondary | ICD-10-CM

## 2014-08-09 DIAGNOSIS — G894 Chronic pain syndrome: Secondary | ICD-10-CM | POA: Insufficient documentation

## 2014-08-09 DIAGNOSIS — Z5181 Encounter for therapeutic drug level monitoring: Secondary | ICD-10-CM | POA: Insufficient documentation

## 2014-08-09 DIAGNOSIS — M5417 Radiculopathy, lumbosacral region: Secondary | ICD-10-CM | POA: Diagnosis not present

## 2014-08-09 DIAGNOSIS — Z79899 Other long term (current) drug therapy: Secondary | ICD-10-CM | POA: Insufficient documentation

## 2014-08-09 DIAGNOSIS — M792 Neuralgia and neuritis, unspecified: Secondary | ICD-10-CM | POA: Insufficient documentation

## 2014-08-09 MED ORDER — ALPRAZOLAM 1 MG PO TABS: ORAL_TABLET | ORAL | Status: DC

## 2014-08-09 MED ORDER — GABAPENTIN 600 MG PO TABS: 1200.0000 mg | ORAL_TABLET | Freq: Three times a day (TID) | ORAL | Status: DC

## 2014-08-09 MED ORDER — PREGABALIN 75 MG PO CAPS: 75.0000 mg | ORAL_CAPSULE | Freq: Two times a day (BID) | ORAL | Status: DC

## 2014-08-09 MED ORDER — TRAMADOL HCL 50 MG PO TABS: 100.0000 mg | ORAL_TABLET | Freq: Three times a day (TID) | ORAL | Status: DC | PRN

## 2014-08-09 NOTE — Progress Notes (Signed)
Pre visit review using our clinic review tool, if applicable. No additional management support is needed unless otherwise documented below in the visit note. 

## 2014-08-09 NOTE — Patient Instructions (Signed)
Reduce gabapentin to 600 mg 3 times a day for 3 days then discontinue you can start the Lyrica at that time

## 2014-08-09 NOTE — Patient Instructions (Addendum)
Risk of premature heart attack or stroke increases as LDL or BAD cholesterol rises, especially if there is family history of heart attack in males before 46 or women before 33.   Please follow a Mediaterranean type diet  (many good cook books readily available) or review Dr Nunzio Cory book Eat, Sweet Grass for best  dietary cholesterol information & options.   Continue excellent cardiovascular exercise program of climbing stairs.   To determine long term risk of heart attack & stroke related to cholesterol levels ( lipids ); advanced cholesterol test such as NMR Lipoprofile is critical. The LDL or BAD cholesterol number alone does not qualify risk. When you & I discuss your cholesterol risks we need to have that actual report to optimally assess any risk & your options.   If you cannot make significant changes in exercise and nutrition; statin medication at low dose would be the best therapeutic option to reduce your long term risk.  Please think about quitting smoking. Review the risks we discussed. Please call 1-800-QUIT-NOW (561)671-6404) for free smoking cessation counseling. There are multiple options are to help you stop smoking. These include nicotine patches, nicotine gum, and the new "E cigarette".

## 2014-08-09 NOTE — Progress Notes (Signed)
Right hip intra-articular injection under fluoroscopic guidance   Indication  Exam has Pain during range of motion and limited range of motion of the hip. Pain has not responded to conservative care such as exercise therapy and oral medications. Pain interferes with sleep or with mobility. 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 Informed consent was obtained after describing risks and benefits of the procedure with the patient these include bleeding bruising and infection. Patient has signed written consent form. Patient placed in a Supine position with the right inguinal area prepped with Betadine.3 cc of 1% lidocaine were infiltrated via 25-gauge 1.5 inch needle into the skin and subcutaneous area. 22-gauge 3.5 inch spinal needle with stylette was advanced to the junction of the femoral head with the femoral neck. Then 1 cc of Omnipaque 180 was injected showing good arthrogram.Then 6mg  of betamethasone with 4 cc 1% lidocaine were injected. Patient tolerated procedure well. Post procedure instructions given.   Patient asking about other medications for her nerve pain, has been on gabapentin without much relief, recommend wean off gabapentin reduced from 1200 3 times a day to 600 3 times a day 3 days then switch to Lyrica 75 mg twice a day for 3 days then 75 mg 3 times a day thereafter may need to adjust dose in future  Also ordered hip x-ray right side in the [place] that she does not get a good relief from this procedure

## 2014-08-09 NOTE — Progress Notes (Signed)
  PROCEDURE RECORD [facility]   Name: Gara Kincade Firmin DOB:1965/08/28 MRN: [id number]  Date:08/09/2014  Physician: Alysia Penna, MD    Nurse/CMA: Daly Whipkey   Allergies:  Allergies  Allergen Reactions  . Morphine And Related Hives  . Penicillins     REACTION: hives    Consent Signed: Yes.    Is patient diabetic? No.  CBG today? .  Pregnant: No. LMP: No LMP recorded. Patient has had a hysterectomy. (age 50-55)  Anticoagulants: no Anti-inflammatory: no Antibiotics: no  Procedure: Fluro guided Rt. Hip Injections  Position: Supine Start Time:  12:48 End Time:  12:53 Fluoro Time:17  RN/CMA Raynie Steinhaus Caeson Filippi    Time 12:30 12:56    BP 124/70 122/68    Pulse 70 100    Respirations 14 14    O2 Sat 98 99    S/S 6 6    Pain Level 8/10  8/10     D/C home with husband, patient A & O X 3, D/C instructions reviewed, and sits independently.

## 2014-08-09 NOTE — Progress Notes (Signed)
   Subjective:    Patient ID: [name] White, female    DOB: 02-28-1965, 49 y.o.   MRN: [id number]  HPI  She is here for refill of her Xanax and gabapentin and to discuss her lipids. She had been prescribed have a statin but did not fill prescription for financial reasons. At that time she did not have insurance. She does decrease her fried foods and red meat intake. She walks 15 stairs at least 3 times a day without cardiopulmonary symptoms. She has smoked from 1982 to the present up to 1 pack per day. Presently she smokes 6 cigarettes a day and is considering stopping. She does not drink.  The family history is significant for coronary artery disease and stroke. Her mother's stroke may have been before 9.   In 12/15 her HDL was 48.2 and LDL 158 w/o statin therapy.  She takes the omeprazole every other day and Zofran as needed. She has no active GI symptoms other than some constipation from the gabapentin for which she takes Mylanta.  She does have some pain in her knees and hips when she climbs stairs  Review of Systems   Chest pain, palpitations, tachycardia, exertional dyspnea, paroxysmal nocturnal dyspnea, claudication or edema are absent.  Unexplained weight loss, abdominal pain, significant dyspepsia, dysphagia, melena, rectal bleeding, or persistently small caliber stools are denied.     Objective:   Physical Exam Pertinent or positive findings include: BMI: 36.82. She has diffuse irregular hyperpigmented scars over the upper extremities. She has a tattoo over the right lower extremity over the lateral calf. Crepitus of the knees is present greater on the right than the left.  General appearance :adequately nourished; in no distress.  Eyes: No conjunctival inflammation or scleral icterus is present.  Oral exam:  Lips and gums are healthy appearing.There is no oropharyngeal erythema or exudate noted. Dental hygiene is good.  Heart:  Normal rate and regular rhythm. S1 and S2  normal without gallop, murmur, click, rub or other extra sounds    Lungs:Chest clear to auscultation; no wheezes, rhonchi,rales ,or rubs present.No increased work of breathing.   Abdomen: bowel sounds normal, soft and non-tender without masses, organomegaly or hernias noted.  No guarding or rebound.   Vascular : all pulses equal ; no bruits present.  Skin:Warm & dry.  Intact without suspicious lesions or rashes ; no tenting or jaundice   Lymphatic: No lymphadenopathy is noted about the head, neck, axilla  Neuro: Strength, tone & DTRs normal.         Assessment & Plan:  #1 dyslipidemia  #2 GERD

## 2014-08-20 ENCOUNTER — Other Ambulatory Visit: Payer: Self-pay | Admitting: Internal Medicine

## 2014-08-20 ENCOUNTER — Other Ambulatory Visit: Payer: BLUE CROSS/BLUE SHIELD

## 2014-08-20 DIAGNOSIS — E785 Hyperlipidemia, unspecified: Secondary | ICD-10-CM

## 2014-08-22 LAB — NMR LIPOPROFILE WITH LIPIDS
Cholesterol, Total: 246 mg/dL — ABNORMAL HIGH (ref 100–199)
HDL Particle Number: 36.8 umol/L (ref >=30.5)
HDL Size: 9.4 nm (ref >=9.2)
HDL-C: 63 mg/dL (ref >39)
LDL (calc): 152 mg/dL — ABNORMAL HIGH (ref 0–99)
LDL Particle Number: 1481 nmol/L — ABNORMAL HIGH (ref <1000)
LDL Size: 22.3 nm (ref >=20.8)
LP-IR Score: <25 (ref <=45)
Large HDL-P: 9.9 umol/L (ref >=4.8)
Large VLDL-P: 1.6 nmol/L (ref <=2.7)
Small LDL Particle Number: 102 nmol/L (ref <=527)
Triglycerides: 156 mg/dL — ABNORMAL HIGH (ref 0–149)
VLDL Size: 35.7 nm (ref <=46.6)

## 2014-08-23 ENCOUNTER — Encounter: Payer: Self-pay | Admitting: Internal Medicine

## 2014-09-04 ENCOUNTER — Encounter: Payer: Self-pay | Admitting: Internal Medicine

## 2014-09-05 NOTE — Telephone Encounter (Signed)
Please advise 

## 2014-09-20 ENCOUNTER — Telehealth: Payer: Self-pay | Admitting: Internal Medicine

## 2014-09-20 NOTE — Telephone Encounter (Signed)
Patient needing prescription for ALPRAZolam [name] White) 1 MG tablet [[id number] Pharmacy is Paediatric nurse on [facility]

## 2014-09-23 MED ORDER — ALPRAZOLAM 1 MG PO TABS: ORAL_TABLET | ORAL | Status: DC

## 2014-10-08 ENCOUNTER — Ambulatory Visit (HOSPITAL_COMMUNITY)
Admission: RE | Admit: 2014-10-08 | Discharge: 2014-10-08 | Disposition: A | Discharge status: Home / Self Care | Payer: BLUE CROSS/BLUE SHIELD | Source: Ambulatory Visit | Attending: Physical Medicine & Rehabilitation | Admitting: Physical Medicine & Rehabilitation

## 2014-10-08 ENCOUNTER — Ambulatory Visit: Payer: BLUE CROSS/BLUE SHIELD | Admitting: Physical Medicine & Rehabilitation

## 2014-10-08 ENCOUNTER — Ambulatory Visit (HOSPITAL_BASED_OUTPATIENT_CLINIC_OR_DEPARTMENT_OTHER): Payer: BLUE CROSS/BLUE SHIELD | Admitting: Physical Medicine & Rehabilitation

## 2014-10-08 ENCOUNTER — Encounter: Payer: Self-pay | Admitting: Physical Medicine & Rehabilitation

## 2014-10-08 ENCOUNTER — Encounter: Payer: BLUE CROSS/BLUE SHIELD | Attending: Physical Medicine & Rehabilitation

## 2014-10-08 VITALS — BP 111/72 | HR 74

## 2014-10-08 DIAGNOSIS — Z79899 Other long term (current) drug therapy: Secondary | ICD-10-CM | POA: Diagnosis not present

## 2014-10-08 DIAGNOSIS — Z5181 Encounter for therapeutic drug level monitoring: Secondary | ICD-10-CM | POA: Diagnosis not present

## 2014-10-08 DIAGNOSIS — R1031 Right lower quadrant pain: Secondary | ICD-10-CM | POA: Insufficient documentation

## 2014-10-08 DIAGNOSIS — G588 Other specified mononeuropathies: Secondary | ICD-10-CM

## 2014-10-08 DIAGNOSIS — G8929 Other chronic pain: Secondary | ICD-10-CM | POA: Insufficient documentation

## 2014-10-08 DIAGNOSIS — M25551 Pain in right hip: Secondary | ICD-10-CM

## 2014-10-08 DIAGNOSIS — G894 Chronic pain syndrome: Secondary | ICD-10-CM | POA: Diagnosis not present

## 2014-10-08 DIAGNOSIS — M792 Neuralgia and neuritis, unspecified: Secondary | ICD-10-CM

## 2014-10-08 MED ORDER — TRAMADOL HCL 50 MG PO TABS: 100.0000 mg | ORAL_TABLET | Freq: Four times a day (QID) | ORAL | Status: DC

## 2014-10-08 MED ORDER — PREGABALIN 100 MG PO CAPS: 100.0000 mg | ORAL_CAPSULE | Freq: Two times a day (BID) | ORAL | Status: DC

## 2014-10-08 MED ORDER — TRAMADOL HCL 50 MG PO TABS: 100.0000 mg | ORAL_TABLET | Freq: Three times a day (TID) | ORAL | Status: DC | PRN

## 2014-10-08 NOTE — Patient Instructions (Signed)
Procedure will be cryoablation to the ilio-hypogastric nerve

## 2014-10-08 NOTE — Progress Notes (Signed)
Subjective:    Patient ID: [name] White, female    DOB: 20-Dec-1965, 49 y.o.   MRN: [id number]  HPI 49 year old female returns today with right groin pain. She has 2 types of pain one is a sharp burning pain across the inguinal region and the other one is more of a dull ache which is not only in her groin area but goes around to her posterior thigh and buttocks area. She's had no trauma to the area. She had 2 weeks of relief with ilio-hypogastric nerve block Pain Inventory Average Pain 7 Pain Right Now 9 My pain is sharp, tingling and aching  In the last 24 hours, has pain interfered with the following? General activity 6 Relation with others 4 Enjoyment of life 8 What TIME of day is your pain at its worst? all Sleep (in general) Poor  Pain is worse with: walking, bending, sitting, inactivity, standing and some activites Pain improves with: rest and medication Relief from Meds: 5  Mobility use a cane ability to climb steps?  yes do you drive?  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  no  Physicians involved in your care Any changes since last visit?  no   Family History  Problem Relation Age of Onset  . COPD Mother   . Stroke Mother   . Coronary artery disease Mother     pacer  . Colon polyps Mother   . Breast cancer Mother   . Hypertension Father   . Colon polyps Brother   . Colon cancer Maternal Grandfather   . Colon polyps Maternal Grandfather   . Breast cancer Maternal Aunt   . Uterine cancer Cousin     x 2   Social History   Social History  . Marital Status: Divorced    Spouse Name: N/A  . Number of Children: 2  . Years of Education: N/A   Occupational History  . unemployed    Social  History Main Topics  . Smoking status: Current Every Day Smoker -- 0.50 packs/day for 30 years    Types: Cigarettes  . Smokeless tobacco: Never Used     Comment: BUT NOT READY  . Alcohol Use: No  . Drug Use: No  . Sexual Activity: Not Asked   Other Topics Concern  . 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 Wrist surgery      right  . Cesarean section      x 2  . Cholecystectomy    . Tubal ligation    . Plantar fascitis      right foot  . Abdominal hysterectomy      secondary to endometriosis  . Laparoscopy  04-22-10    lysis of adhesions  . Elbow surgery      left  . Salpingectomy  04-22-10    right  . Laparoscopy  04-30-10     with mini laparotomy and cystotomy repair  . Colonoscopy w/ polypectomy  2012   Past Medical History  Diagnosis Date  .  Depression   . Barrett's esophagus     egd 2007 + 2009 but none 2011 or 08/2010 egd  . Colon polyp     hyperplastic  . Grief reaction   . Chronic headache   . Tobacco abuse   . Migraine   . Restless leg   . IBS (irritable bowel syndrome)   . Hidradenitis suppurativa   . Ovarian cyst   . OSA (obstructive sleep apnea)    BP 111/72 mmHg  Pulse 74  SpO2 98%  Opioid Risk Score:   Fall Risk Score:  `1  Depression screen PHQ 2/9  Depression screen [facility] 2/9 10/08/2014 05/10/2014 08/30/2012  Decreased Interest 3 3 1   Down, Depressed, Hopeless 2 3 1   PHQ - 2 Score 5 6 2   Altered sleeping 2 2 0  Tired, decreased energy 2 2 1   Change in appetite 0 2 0  Feeling bad or failure about yourself  1 3 0  Trouble concentrating 1 1 0  Moving slowly or fidgety/restless 3 2 0  Suicidal thoughts 0 1 0  PHQ-9 Score 14 19 3   Difficult doing work/chores Somewhat difficult - -     Review of Systems  Constitutional: Positive for diaphoresis.    Respiratory: Positive for apnea.   Gastrointestinal: Positive for constipation.  Musculoskeletal: Positive for gait problem.       Spasms  Neurological: Positive for weakness.       Tingling  Psychiatric/Behavioral: Positive for dysphoric mood. The patient is nervous/anxious.   All other systems reviewed and are negative.      Objective:   Physical Exam  Constitutional: She is oriented to person, place, and time. She appears well-developed and well-nourished.  HENT:  Head: Normocephalic and atraumatic.  Eyes: Pupils are equal, round, and reactive to light.  Neurological: She is alert and oriented to person, place, and time.  Psychiatric: She has a normal mood and affect.  Nursing note and vitals reviewed.  Negative straight leg raising test She has some pain with internal/external rotation of the right greater than the left hip. She has limitation in right internal/external rotation greater and left side Patient has some hypersensitivity to touch especially to pinprick over the right inguinal crease.       Assessment & Plan:  1.  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 We discussed cryoablation as this may give a 3 month or more pain relief.  2. Right groin pain with one-month relief after intra-articular injection under fluoroscopic guidance. We'll check x-rays. If x-ray has significant degenerative changes would refer to Orthopedic surgery. . If not we may need to image the lumbar spine.Also repeat injection may be helpful  As discussed with the patient is a complex situation given that she has 2 potential etiologies of her pain or possibly 3. Will need to isolate each pain and treat as much as we can. Further diagnostic imaging is needed as well.  Over half of the 25 min visit was spent counseling and coordinating care.

## 2014-11-05 ENCOUNTER — Ambulatory Visit: Payer: BLUE CROSS/BLUE SHIELD | Admitting: Physical Medicine & Rehabilitation

## 2014-11-13 ENCOUNTER — Telehealth: Payer: Self-pay | Admitting: *Deleted

## 2014-11-13 MED ORDER — TRAMADOL HCL 50 MG PO TABS: 100.0000 mg | ORAL_TABLET | Freq: Four times a day (QID) | ORAL | Status: DC

## 2014-11-13 MED ORDER — PREGABALIN 100 MG PO CAPS: 100.0000 mg | ORAL_CAPSULE | Freq: Three times a day (TID) | ORAL | Status: DC

## 2014-11-13 NOTE — Telephone Encounter (Signed)
Jennifier called about her tramadol and lyrica which pharmacy is having a problem with conflicting sig and disp.  I have corrected with Vladimir Faster

## 2014-11-15 ENCOUNTER — Encounter: Payer: BLUE CROSS/BLUE SHIELD | Attending: Physical Medicine & Rehabilitation

## 2014-11-15 ENCOUNTER — Encounter: Payer: Self-pay | Admitting: Physical Medicine & Rehabilitation

## 2014-11-15 ENCOUNTER — Ambulatory Visit (HOSPITAL_BASED_OUTPATIENT_CLINIC_OR_DEPARTMENT_OTHER): Payer: BLUE CROSS/BLUE SHIELD | Admitting: Physical Medicine & Rehabilitation

## 2014-11-15 VITALS — BP 101/56 | HR 77 | Resp 14

## 2014-11-15 DIAGNOSIS — Z79899 Other long term (current) drug therapy: Secondary | ICD-10-CM | POA: Insufficient documentation

## 2014-11-15 DIAGNOSIS — Z5181 Encounter for therapeutic drug level monitoring: Secondary | ICD-10-CM | POA: Insufficient documentation

## 2014-11-15 DIAGNOSIS — M792 Neuralgia and neuritis, unspecified: Secondary | ICD-10-CM | POA: Diagnosis not present

## 2014-11-15 DIAGNOSIS — G894 Chronic pain syndrome: Secondary | ICD-10-CM | POA: Insufficient documentation

## 2014-11-15 DIAGNOSIS — M25551 Pain in right hip: Secondary | ICD-10-CM | POA: Insufficient documentation

## 2014-11-15 DIAGNOSIS — G8929 Other chronic pain: Secondary | ICD-10-CM | POA: Insufficient documentation

## 2014-11-15 DIAGNOSIS — G588 Other specified mononeuropathies: Secondary | ICD-10-CM

## 2014-11-15 NOTE — Patient Instructions (Signed)
No restrictions Nerve block will wear off tonite, may have extra pain before Cryo full effective

## 2014-11-15 NOTE — Progress Notes (Signed)
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  Linear transducers utilized. Internal obliques were identified fascial plane and transversus abdominus  Muscles were identified in short axis view.A 25-gauge 1.5 inch needle was used and assess skin and subcutaneous tissue then a 15 mm Echo block needle was inserted under direct ultrasound visualization to the fascial plane with identified nerve bundle. 5 cc of 0.25% Marcaine were infiltrated with distention of the fascial plane. Then a 21-gauge needle was used to Penetrate the skin and then the cryo-ablation probe 50 mm was inserted To the fascial plane next to the nerve bundle. Cry O ablation was performed for 60 seconds using the IOVERA cryoablation device. 2 lesions were performed. Patient tolerated procedure well. Post procedure instructions given.   In addition we discussed her right groin pain which I think is separate from her iliohypogastric nerve pain. She had a normal x-ray of the right hip. I suspect she may have a labral tear or other intra-articular issue and have referred her to orthopedics to further evaluate

## 2015-01-20 ENCOUNTER — Encounter (HOSPITAL_COMMUNITY): Payer: Self-pay

## 2015-01-20 ENCOUNTER — Emergency Department (HOSPITAL_COMMUNITY)
Admission: EM | Admit: 2015-01-20 | Discharge: 2015-01-20 | Discharge status: Against Medical Advice | Payer: BLUE CROSS/BLUE SHIELD | Attending: Emergency Medicine | Admitting: Emergency Medicine

## 2015-01-20 DIAGNOSIS — R079 Chest pain, unspecified: Secondary | ICD-10-CM | POA: Diagnosis present

## 2015-01-20 DIAGNOSIS — F17210 Nicotine dependence, cigarettes, uncomplicated: Secondary | ICD-10-CM | POA: Insufficient documentation

## 2015-01-20 LAB — CBC
HEMATOCRIT: 41.9 % (ref 36.0–46.0)
Hemoglobin: 13.6 g/dL (ref 12.0–15.0)
MCH: 30.5 pg (ref 26.0–34.0)
MCHC: 32.5 g/dL (ref 30.0–36.0)
MCV: 93.9 fL (ref 78.0–100.0)
PLATELETS: 187 10*3/uL (ref 150–400)
RBC: 4.46 MIL/uL (ref 3.87–5.11)
RDW: 13.4 % (ref 11.5–15.5)
WBC: 5.3 10*3/uL (ref 4.0–10.5)

## 2015-01-20 LAB — BASIC METABOLIC PANEL
Anion gap: 9 (ref 5–15)
BUN: 7 mg/dL (ref 6–20)
CALCIUM: 9.5 mg/dL (ref 8.9–10.3)
CO2: 28 mmol/L (ref 22–32)
Chloride: 101 mmol/L (ref 101–111)
Creatinine, Ser: 0.91 mg/dL (ref 0.44–1.00)
GFR calc Af Amer: >60 mL/min (ref >60)
GLUCOSE: 93 mg/dL (ref 65–99)
POTASSIUM: 3.8 mmol/L (ref 3.5–5.1)
SODIUM: 138 mmol/L (ref 135–145)

## 2015-01-20 LAB — I-STAT TROPONIN, ED: TROPONIN I, POC: 0.02 ng/mL (ref 0.00–0.08)

## 2015-01-20 NOTE — ED Notes (Addendum)
Pt here from [facility] for chest pain. Has been bothering her since Friday but states she has a cold. She states she has doubled over 2x since then from the pain. Grabbed her chest while at [facility] and the provider was worried and sent her to the ED to be evaluated. Pt had a chest XR done at [facility] and shows up in care everywhere.

## 2015-01-20 NOTE — ED Notes (Addendum)
Pt came to desk and asked how much longer she was going to have to wait and I informed her there was 13 people ahead of her. Pt stated that she was going to go home because all of this was due to the cold that she was seen for at urgent care. Asked pt to come back if symptoms got worse

## 2015-01-22 ENCOUNTER — Encounter: Payer: Self-pay | Admitting: Internal Medicine

## 2015-02-07 ENCOUNTER — Encounter: Payer: Self-pay | Admitting: Physical Medicine & Rehabilitation

## 2015-02-07 ENCOUNTER — Ambulatory Visit (HOSPITAL_BASED_OUTPATIENT_CLINIC_OR_DEPARTMENT_OTHER): Payer: BLUE CROSS/BLUE SHIELD | Admitting: Physical Medicine & Rehabilitation

## 2015-02-07 ENCOUNTER — Encounter: Payer: BLUE CROSS/BLUE SHIELD | Attending: Physical Medicine & Rehabilitation

## 2015-02-07 VITALS — BP 109/67 | HR 78 | Resp 14

## 2015-02-07 DIAGNOSIS — G8929 Other chronic pain: Secondary | ICD-10-CM | POA: Insufficient documentation

## 2015-02-07 DIAGNOSIS — Z79899 Other long term (current) drug therapy: Secondary | ICD-10-CM | POA: Insufficient documentation

## 2015-02-07 DIAGNOSIS — M792 Neuralgia and neuritis, unspecified: Secondary | ICD-10-CM | POA: Insufficient documentation

## 2015-02-07 DIAGNOSIS — M25551 Pain in right hip: Secondary | ICD-10-CM

## 2015-02-07 DIAGNOSIS — Z5181 Encounter for therapeutic drug level monitoring: Secondary | ICD-10-CM | POA: Diagnosis not present

## 2015-02-07 DIAGNOSIS — G894 Chronic pain syndrome: Secondary | ICD-10-CM | POA: Diagnosis not present

## 2015-02-07 MED ORDER — PREGABALIN 100 MG PO CAPS: 100.0000 mg | ORAL_CAPSULE | Freq: Three times a day (TID) | ORAL | Status: DC

## 2015-02-07 MED ORDER — TRAMADOL HCL 50 MG PO TABS: 100.0000 mg | ORAL_TABLET | Freq: Four times a day (QID) | ORAL | Status: DC

## 2015-02-07 NOTE — Progress Notes (Signed)
Subjective:    Patient ID: [name] White, female    DOB: April 01, 1965, 49 y.o.   MRN: [id number]  HPI Right Iliohypogastric rest crit cryoablation was helpful for the right inguinal pain. Was seen by orthopedic surgery for the right hip joint pain. No surgical intervention was recommended. Right hip intra-articular injection performed in June under fluoroscopic guidance was not helpful. 10/08/2014 x-ray right hip was unremarkable Right hip MRI in orthopedic office which was reported to be unremarkable Pain Inventory Average Pain 5 Pain Right Now 5 My pain is constant, dull and aching  In the last 24 hours, has pain interfered with the following? General activity 5 Relation with others 0 Enjoyment of life 5 What TIME of day is your pain at its worst? varies Sleep (in general) Fair  Pain is worse with: walking, bending and some activites Pain improves with: rest and heat/ice Relief from Meds: 6  Mobility walk without assistance Do you have any goals in this area?  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  no  Neuro/Psych weakness spasms confusion depression anxiety  Prior Studies CT/MRI  Physicians involved in your care Any changes since last visit?  no   Family History  Problem Relation Age of Onset  . COPD Mother   . Stroke Mother   . Coronary artery disease Mother     pacer  . Colon polyps Mother   . Breast cancer Mother   . Hypertension Father   . Colon polyps Brother   . Colon cancer Maternal Grandfather   . Colon polyps Maternal Grandfather   . Breast cancer Maternal Aunt   . Uterine cancer Cousin     x 2   Social History   Social History  . Marital Status: Divorced    Spouse Name: N/A  . Number of Children: 2  . Years of Education: N/A   Occupational History  . unemployed    Social History Main Topics  . Smoking status: Current Every Day Smoker -- 0.50 packs/day  for 30 years    Types: Cigarettes  . Smokeless tobacco: Never Used     Comment: BUT NOT READY  . Alcohol Use: No  . Drug Use: No  . Sexual Activity: Not Asked   Other Topics Concern  . 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 Wrist surgery      right  . Cesarean section      x 2  . Cholecystectomy    . Tubal ligation    . Plantar fascitis      right foot  . Abdominal hysterectomy      secondary to endometriosis  . Laparoscopy  04-22-10    lysis of adhesions  . Elbow surgery      left  . Salpingectomy  04-22-10    right  . Laparoscopy  04-30-10     with mini laparotomy and cystotomy repair  . Colonoscopy w/ polypectomy  2012   Past Medical History  Diagnosis Date  . Depression   . Barrett's esophagus     egd 2007 + 2009 but none 2011 or 08/2010 egd  . Colon polyp  hyperplastic  . Grief reaction   . Chronic headache   . Tobacco abuse   . Migraine   . Restless leg   . IBS (irritable bowel syndrome)   . Hidradenitis suppurativa   . Ovarian cyst   . OSA (obstructive sleep apnea)    BP 109/67 mmHg  Pulse 78  Resp 14  SpO2 99%  Opioid Risk Score:   Fall Risk Score:  `1  Depression screen PHQ 2/9  Depression screen [facility] 2/9 10/08/2014 05/10/2014 08/30/2012  Decreased Interest 3 3 1   Down, Depressed, Hopeless 2 3 1   PHQ - 2 Score 5 6 2   Altered sleeping 2 2 0  Tired, decreased energy 2 2 1   Change in appetite 0 2 0  Feeling bad or failure about yourself  1 3 0  Trouble concentrating 1 1 0  Moving slowly or fidgety/restless 3 2 0  Suicidal thoughts 0 1 0  PHQ-9 Score 14 19 3   Difficult doing work/chores Somewhat difficult - -     Review of Systems  Musculoskeletal:       Spasms  Neurological: Positive for weakness.  Psychiatric/Behavioral: Positive for confusion  and dysphoric mood. The patient is nervous/anxious.        Objective:   Physical Exam  Constitutional: She is oriented to person, place, and time. She appears well-developed and well-nourished.  HENT:  Head: Normocephalic and atraumatic.  Eyes: Conjunctivae are normal. Pupils are equal, round, and reactive to light.  Musculoskeletal:       Right hip: She exhibits decreased range of motion. She exhibits normal strength, no tenderness, no swelling and no deformity.  Neurological: She is alert and oriented to person, place, and time.  Psychiatric: She has a normal mood and affect.  Nursing note and vitals reviewed.   Tenderness over her right ASIS no bruising no other lesions noted. No numbness over the inguinal area. There is some lateral hip pain with right hip internal/external rotation Negative straight leg raising Normal strength in the lower extremities     Assessment & Plan:  1. 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 As discussed with the patient this can be repeated every 3 months as needed. Thus far it has been 2-1/2 months of relief. 2. Right hip musculoskeletal pain we discussed that while the joint itself looks okay there could be some soft tissue pain around the joint. Do not recommend injection for this  We will make referral to physical therapy in [place]  Continue tramadol 100 mg 4 times a day for chronic pain related to above conditions Continue Lyrica for neuropathic pain 100 mg 3 times a day  M.D. Follow-up in 6 months unless the cryoablation wears off in which [place] we will see her sooner

## 2015-02-07 NOTE — Patient Instructions (Signed)
Therapy in [place] should be calling you for an appointment

## 2015-02-27 ENCOUNTER — Ambulatory Visit: Payer: BLUE CROSS/BLUE SHIELD | Admitting: Rehabilitative and Restorative Service Providers"

## 2015-03-03 ENCOUNTER — Ambulatory Visit: Payer: BLUE CROSS/BLUE SHIELD | Admitting: Rehabilitative and Restorative Service Providers"

## 2015-03-04 ENCOUNTER — Telehealth: Payer: Self-pay

## 2015-03-04 NOTE — Telephone Encounter (Signed)
ok to change to [facility]

## 2015-03-04 NOTE — Telephone Encounter (Signed)
Can you help?

## 2015-03-04 NOTE — Telephone Encounter (Signed)
Pt called requesting her therapy to be in [place] now. She no longer works in [place], so she would like it to be close to home. Is this okay to change order?

## 2015-03-04 NOTE — Telephone Encounter (Signed)
Referral switched to [place]. Pt notified of switch.

## 2015-03-05 ENCOUNTER — Encounter: Payer: BLUE CROSS/BLUE SHIELD | Admitting: Rehabilitative and Restorative Service Providers"

## 2015-03-11 ENCOUNTER — Ambulatory Visit (HOSPITAL_BASED_OUTPATIENT_CLINIC_OR_DEPARTMENT_OTHER): Payer: BLUE CROSS/BLUE SHIELD | Admitting: Physical Medicine & Rehabilitation

## 2015-03-11 ENCOUNTER — Encounter: Payer: BLUE CROSS/BLUE SHIELD | Attending: Physical Medicine & Rehabilitation

## 2015-03-11 ENCOUNTER — Encounter: Payer: Self-pay | Admitting: Physical Medicine & Rehabilitation

## 2015-03-11 VITALS — BP 108/66 | HR 88

## 2015-03-11 DIAGNOSIS — G8929 Other chronic pain: Secondary | ICD-10-CM | POA: Insufficient documentation

## 2015-03-11 DIAGNOSIS — G5790 Unspecified mononeuropathy of unspecified lower limb: Secondary | ICD-10-CM | POA: Insufficient documentation

## 2015-03-11 DIAGNOSIS — M792 Neuralgia and neuritis, unspecified: Secondary | ICD-10-CM

## 2015-03-11 DIAGNOSIS — Z79899 Other long term (current) drug therapy: Secondary | ICD-10-CM | POA: Insufficient documentation

## 2015-03-11 DIAGNOSIS — M25551 Pain in right hip: Secondary | ICD-10-CM | POA: Diagnosis not present

## 2015-03-11 DIAGNOSIS — Z5181 Encounter for therapeutic drug level monitoring: Secondary | ICD-10-CM | POA: Diagnosis present

## 2015-03-11 DIAGNOSIS — G5791 Unspecified mononeuropathy of right lower limb: Secondary | ICD-10-CM | POA: Insufficient documentation

## 2015-03-11 DIAGNOSIS — G894 Chronic pain syndrome: Secondary | ICD-10-CM | POA: Insufficient documentation

## 2015-03-11 DIAGNOSIS — G588 Other specified mononeuropathies: Secondary | ICD-10-CM

## 2015-03-11 NOTE — Patient Instructions (Signed)
No restrictions Nerve block will wear off tonite, may have extra pain before Cryo full effective 

## 2015-03-11 NOTE — Progress Notes (Signed)
Cryoablation Right iliohypogastric nerve And right ilioinguinal nerves  Indication is Right iliohypogastric/ilioinguinal neuritis related to abdominal surgeries which has been relieved on a short-term basis using Transversus abdominis plane block Pain is only partially responsive medication management of the conservative care and interferes with daily activities  Informed consent was obtained at describing risks and benefits of the procedure with patient these include bleeding bruising and infection she elects to proceed and has given written consent Ultrasound scanning was performed using the anterior superior iliac spine and umbilicus  As landmarks.  Linear transducers utilized. Internal obliques were identified fascial plane and transversus abdominus  Muscles were identified in short axis view.A 25-gauge 1.5 inch needle was used and assess skin and subcutaneous tissue then a 15 mm Echo block needle was inserted under direct ultrasound visualization to the fascial plane with identified nerve bundle. 5 cc of 0.25% Marcaine were infiltrated with distention of the fascial plane. Then a 21-gauge needle was used to Penetrate the skin and then the cryo-ablation probe 50 mm was inserted To the fascial plane next to the nerve bundle. Cryo ablation was performed for 60 seconds using the IOVERA cryoablation device. 2 lesions were performed.1 lesion targeting iliohypogastric and 1 lesion ilioinguinal Patient tolerated procedure well. Post procedure instructions given.   In addition we discussed her right Lower extremity pain. We'll go through physical therapy if no better consider MRI return to clinic 6 weeks

## 2015-03-12 ENCOUNTER — Ambulatory Visit: Payer: BLUE CROSS/BLUE SHIELD | Attending: Physical Medicine & Rehabilitation

## 2015-03-12 DIAGNOSIS — R262 Difficulty in walking, not elsewhere classified: Secondary | ICD-10-CM | POA: Diagnosis present

## 2015-03-12 DIAGNOSIS — M25551 Pain in right hip: Secondary | ICD-10-CM | POA: Insufficient documentation

## 2015-03-12 DIAGNOSIS — M792 Neuralgia and neuritis, unspecified: Secondary | ICD-10-CM

## 2015-03-12 DIAGNOSIS — M24851 Other specific joint derangements of right hip, not elsewhere classified: Secondary | ICD-10-CM | POA: Insufficient documentation

## 2015-03-12 NOTE — Therapy (Addendum)
[place], [place], [address] Phone: [phone]   Fax:  [phone]  Physical Therapy Evaluation  Patient Details  Name: [name] White MRN: [id number] Date of Birth: June 21, 1965 Referring Provider: Read Drivers  Encounter Date: 03/12/2015      PT End of Session - 03/12/15 0909    Visit Number 1   Number of Visits 8   Date for PT Re-Evaluation 05/06/15   Authorization - Number of Visits 62   PT Start Time 0807   PT Stop Time 0845   PT Time Calculation (min) 38 min   Activity Tolerance Patient limited by pain   Behavior During Therapy [facility] for tasks assessed/performed      Past Medical History  Diagnosis Date  . Depression   . Barrett's esophagus     egd 2007 + 2009 but none 2011 or 08/2010 egd  . Colon polyp     hyperplastic  . Grief reaction   . Chronic headache   . Tobacco abuse   . Migraine   . Restless leg   . IBS (irritable bowel syndrome)   . Hidradenitis suppurativa   . Ovarian cyst   . OSA (obstructive sleep apnea)     Past Surgical History  Procedure Laterality Date  . Wrist surgery      right  . Cesarean section      x 2  . Cholecystectomy    . Tubal ligation    . Plantar fascitis      right foot  . Abdominal hysterectomy      secondary to endometriosis  . Laparoscopy  04-22-10    lysis of adhesions  . Elbow surgery      left  . Salpingectomy  04-22-10    right  . Laparoscopy  04-30-10     with mini laparotomy and cystotomy repair  . Colonoscopy w/ polypectomy  2012    There were no vitals filed for this visit.  Visit Diagnosis:  Other specific joint derangements of right hip, not elsewhere classified - Plan: PT plan of care cert/re-cert  Difficulty in walking involving joint - Plan: PT plan of care cert/re-cert  Right hip pain - Plan: PT plan of care cert/re-cert  Neuropathic pain - Plan: PT plan of care cert/re-cert      Subjective Assessment - 03/12/15 0811    Subjective Pt reports R hip pain that began a 04/21/2010 after exploratory abdominal surgery and injury to nerves. Pt had nerve cryoablation nerve bolck yesterday without much relief, yet. MRI and X ray of R hip were negative, per chart review.  Pt c/o of crepetis "popping" and "grinding" in R hip with stairs. R hip pain is 7/10 ache deep in groin.     Limitations Standing;Walking;Sitting;House hold activities   How long can you sit comfortably? 15 mins   How long can you stand comfortably? 5 mins    How long can you walk comfortably? 5 mins    Diagnostic tests X ray and MRI negative.    Currently in Pain? Yes   Pain Score 7    Pain Location Hip   Pain Orientation Right   Pain Descriptors / Indicators Aching;Constant   Pain Type Chronic pain;Neuropathic pain   Pain Radiating Towards to R knee, anterior thigh, occasionally to whole lower leg, parasthesia.    Pain Onset More than a month ago   Pain Frequency Constant   Aggravating Factors  walking, standing, nbending, stairs, sexual intercourse, transferring into  and out of car.    Pain Relieving Factors lay down, medication, heat    Effect of Pain on Daily Activities unable to play with grandkids, housework, stairs.             [facility] PT Assessment - 03/12/15 0819    Assessment   Medical Diagnosis R hip pain    Referring Provider Kirstein   Onset Date/Surgical Date 04/21/10   Hand Dominance Right   Next MD Visit 04/22/2015   Prior Therapy none   Precautions   Precautions None   Restrictions   Weight Bearing Restrictions No   Balance Screen   Has the patient fallen in the past 6 months Yes   How many times? 2   Has the patient had a decrease in activity level because of a fear of falling?  No   Is the patient reluctant to leave their home because of a fear of falling?  No   Home Environment   Living Environment Private residence   Home Access Stairs to enter  BF's apt.    Prior Function   Level of Independence Independent    Observation/Other Assessments   Other Surveys  Other Surveys   Lower Extremity Functional Scale  15/80   ROM / Strength   AROM / PROM / Strength AROM;Strength   AROM   AROM Assessment Site Hip   Right/Left Hip Right   Right Hip Extension -10  ERP   Right Hip Flexion 90  PROM   Right Hip External Rotation  30  PROM   Right Hip Internal Rotation  20  PROM   Right Hip ABduction 25  PROM   Right Hip ADduction 10  PROM   PROM   PROM Assessment Site Knee   Right/Left Hip Right   Right Hip External Rotation  --   Right Hip Internal Rotation  --   Right/Left Knee Right   Right Knee Extension -10   Strength   Strength Assessment Site Hip   Right/Left Hip Right   Right Hip Flexion 3-/5   Right Hip Extension --  deferred testing due to pain   Right Hip External Rotation  --  deferred testing due to pain   Right Hip Internal Rotation --  deferred testing due to pain   Right Hip ABduction 2-/5   Palpation   Palpation comment TPs at R hip flexor and R hip Adductors.    Special Tests    Special Tests Hip Special Tests   Hip Special Tests  Saralyn Pilar (FABER) Test;Thomas Test;Hip Scouring;Anterior Hip Impingement Test   Saralyn Pilar [facility]) Test   Findings Positive   Side Right   Thomas Test    Findings Positive   Side Right   Hip Scouring   Findings Positive   Side Right   Anterior Hip Impingement Test    Findings Positive   Side  Right   Ambulation/Gait   Assistive device None   Gait Pattern Step-to pattern;Decreased step length - left;Decreased stance time - right;Antalgic                   [facility] Adult PT Treatment/Exercise - 03/12/15 0819    Manual Therapy   Manual Therapy Joint mobilization;Soft tissue mobilization  decreased pain following STM and gentle long axis traction.    Manual therapy comments --   Joint Mobilization long axis traction R hip, 5 secs x 5 reps   Soft tissue mobilization STM to R hip flexor and hip ADDuctors  PT  Education - 03/12/15 0908    Education provided Yes   Education Details PT POC- recommended 3 times a week and importance of consistency for improved outcomes, but pt only able to commit to 2 times a week due to financial constraints.    Person(s) Educated Patient   Methods Explanation;Demonstration   Comprehension Verbalized understanding          PT Short Term Goals - 03/12/15 0918    PT SHORT TERM GOAL #1   Title Pt will be I with initial HEP for continued strengthening and mobility  by 04/12/15.    Time 4   Period Weeks   Status New   PT SHORT TERM GOAL #2   Title R hip PROM will improve by 5 degrees in flexion and IR with less pain in order to perform car transfer with less pain by 04/12/15.    Time 4   Period Weeks   Status New   PT SHORT TERM GOAL #3   Title Pt will demonstrate use of SPC in home and community independently by 04/12/15.    Time 4   Period Weeks   Status New           PT Long Term Goals - 03/12/15 0920    PT LONG TERM GOAL #1   Title Pt will be able to walk as needed in community 1000 feet independently without limitation of pain by 05/06/15.    Time 8   Period Weeks   Status New   PT LONG TERM GOAL #2   Title LEFS will improve from 15/80 to 30/80 by 05/06/15.    Time 8   Period Weeks   Status New   PT LONG TERM GOAL #3   Title R hip flexion strength will improve from 3-/5 to 3+/5 in order for pt to perform stairs reciprocally by 05/06/15.    Time 8   Period Weeks   Status New               Plan - 03/12/15 0911    Clinical Impression Statement Pt presents for moderate complexity evaluation for hip pain. Signs and symptoms are compatible with R hip derangement. X ray was negative. Pt presents with impairments including pain, impaired mobility/ROM, and impaired strength, which limit functional abilities with gait, standing, sitting, transfers, bending, and lifting.  Pt will benefit from oupt PT for 3 times a week for 8 weeks (but pt only  agreeable to 2 times a week due to financial constraints) for core strengthening, stretching, manual therapy, education on positioning and body mechanics in order to address these impairments and functional limitations and return pt to pain-free PLOF.   Pt will benefit from skilled therapeutic intervention in order to improve on the following deficits Abnormal gait;Decreased activity tolerance;Decreased balance;Decreased coordination;Decreased endurance;Decreased range of motion;Decreased mobility;Decreased knowledge of use of DME;Decreased strength;Difficulty walking;Impaired flexibility;Increased muscle spasms;Improper body mechanics;Pain   Rehab Potential Fair   Clinical Impairments Affecting Rehab Potential chronic condition   PT Frequency 2x / week   PT Duration 8 weeks   PT Treatment/Interventions ADLs/Self Care Home Management;Electrical Stimulation;Cryotherapy;Moist Heat;Traction;Ultrasound;Patient/family education;Neuromuscular re-education;Balance training;Therapeutic exercise;Therapeutic activities;Functional mobility training;DME Instruction;Gait training;Manual techniques;Passive range of motion;Taping   PT Next Visit Plan Instruct in use of SPC.  Est HEP.  Manual therapy, STM and Joint mob with belt.   PT Home Exercise Plan Est program next visit.    Consulted and Agree with Plan of Care Patient  Problem List Patient Active Problem List   Diagnosis Date Noted  . 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 Hyperlipidemia 08/23/2014  . 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 Chronic right hip pain 04/16/2014  . Musculoskeletal chest pain 09/25/2013  . Excessive sleepiness 01/30/2012  . OSA (obstructive sleep apnea)   . Depression   . Right lumbosacral radiculopathy 07/10/2010  . Obesity 11/20/2009  . RESTLESS LEG SYNDROME 11/20/2009  . Dyslipidemia 05/05/2009  . BARRETTS ESOPHAGUS 10/04/2008  . IRRITABLE BOWEL SYNDROME 10/04/2008  . 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 HEADACHE, CHRONIC 09/28/2007  . TOBACCO ABUSE 12/17/2006    [name] White , PT, DPT   03/12/2015, 2:12 PM  [facility] [address] [place], [place], [address] Phone: [phone]   Fax:  [phone]  Name: [name] White MRN: [id number] Date of Birth: 1966-02-15   Pt did not return after initial evaluation. She called to cancel appointments stating she was no longer employed.  Romualdo Bolk, PT, DPT 10/02/15 10:19 AM Phone: [phone] Fax: [phone]

## 2015-03-13 ENCOUNTER — Ambulatory Visit: Payer: BLUE CROSS/BLUE SHIELD | Admitting: Physical Therapy

## 2015-03-20 ENCOUNTER — Other Ambulatory Visit: Payer: Self-pay | Admitting: Internal Medicine

## 2015-03-21 NOTE — Telephone Encounter (Signed)
Patient is going to establish with [facility].

## 2015-04-08 ENCOUNTER — Encounter: Payer: BLUE CROSS/BLUE SHIELD | Admitting: Internal Medicine

## 2015-04-22 ENCOUNTER — Ambulatory Visit: Payer: BLUE CROSS/BLUE SHIELD | Admitting: Physical Medicine & Rehabilitation

## 2015-05-02 ENCOUNTER — Ambulatory Visit (INDEPENDENT_AMBULATORY_CARE_PROVIDER_SITE_OTHER): Payer: Self-pay | Admitting: Internal Medicine

## 2015-05-02 ENCOUNTER — Encounter: Payer: Self-pay | Admitting: Internal Medicine

## 2015-05-02 VITALS — BP 114/60 | HR 74 | Temp 98.6°F | Resp 18 | Ht 66.0 in | Wt 214.0 lb

## 2015-05-02 DIAGNOSIS — Z86010 Personal history of colonic polyps: Secondary | ICD-10-CM

## 2015-05-02 DIAGNOSIS — R42 Dizziness and giddiness: Secondary | ICD-10-CM

## 2015-05-02 MED ORDER — MECLIZINE HCL 25 MG PO TABS: 25.0000 mg | ORAL_TABLET | Freq: Three times a day (TID) | ORAL | Status: DC | PRN

## 2015-05-02 NOTE — Progress Notes (Signed)
   Subjective:    Patient ID: [name] White, female    DOB: 20-Nov-1965, 50 y.o.   MRN: [id number]  HPI The patient is a 49 YO female coming in for dizzy spells. They make her feel like the room is spinning. Rarely she feels like she might pass out. She is able to grab onto objects to prevent falls when this happens. More often with sudden head turns or in the morning when getting up. She is not using her CPAP right now and not getting rest. She sleeps better with it but does not have a recent sleep study and they will not replace her supplies without it. Some ear discomfort and some mild allergies. Symptoms going on for 1-2 months. Overall stable since onset, not daily but at least 1 time per week. She is scared one will happen when she is driving.   Review of Systems  Constitutional: Negative for fever, activity change, appetite change, fatigue and unexpected weight change.  Respiratory: Negative.   Cardiovascular: Negative.   Gastrointestinal: Negative.   Musculoskeletal: Negative.   Skin: Negative.   Neurological: Positive for dizziness and light-headedness. Negative for tremors, seizures, syncope, weakness, numbness and headaches.      Objective:   Physical Exam  Constitutional: She is oriented to person, place, and time. She appears well-developed and well-nourished.  HENT:  Head: Normocephalic and atraumatic.  Right Ear: External ear normal.  Left Ear: External ear normal.  Bilateral TM normal  Cardiovascular: Normal rate and regular rhythm.   Pulmonary/Chest: Effort normal and breath sounds normal. She has no wheezes. She has no rales.  Abdominal: Soft. Bowel sounds are normal. She exhibits no distension. There is no tenderness. There is no rebound.  Musculoskeletal: She exhibits no edema.  Neurological: She is alert and oriented to person, place, and time. Coordination normal.  Skin: Skin is warm and dry.   Filed Vitals:   05/02/15 1558  BP: 114/60  Pulse: 74  Temp: 98.6  F (37 C)  TempSrc: Oral  Resp: 18  Height: 5\' 6"  (1.676 m)  Weight: 214 lb (97.07 kg)  SpO2: 98%      Assessment & Plan:

## 2015-05-02 NOTE — Patient Instructions (Signed)
We have sent in meclizine for the dizziness that you can use up to 3 times per day for the dizzy spells.   We are also checking an MRI of the brain to make sure there is no cause.   We are getting you back in with GI upstairs (Dr. Olevia Perches has retired this year).

## 2015-05-02 NOTE — Progress Notes (Signed)
Pre visit review using our clinic review tool, if applicable. No additional management support is needed unless otherwise documented below in the visit note. 

## 2015-05-03 ENCOUNTER — Other Ambulatory Visit: Payer: Self-pay | Admitting: Internal Medicine

## 2015-05-04 DIAGNOSIS — Z860100 Personal history of colon polyps, unspecified: Secondary | ICD-10-CM | POA: Insufficient documentation

## 2015-05-04 DIAGNOSIS — R42 Dizziness and giddiness: Secondary | ICD-10-CM | POA: Insufficient documentation

## 2015-05-04 NOTE — Assessment & Plan Note (Signed)
Refer to GI, last colonoscopy in 2012 with polyps and family history of colon cancer. Previous with Dr. Olevia Perches who has retired and she needs new GI.

## 2015-05-04 NOTE — Assessment & Plan Note (Signed)
Rx for meclizine and MRI brain to ensure no structural problem given the length of her symptoms. Ears clear on exam.

## 2015-05-05 NOTE — Telephone Encounter (Signed)
Sent to pharmacy 

## 2015-05-07 ENCOUNTER — Other Ambulatory Visit: Payer: Self-pay | Admitting: Internal Medicine

## 2015-05-08 NOTE — Telephone Encounter (Signed)
Sent to pharmacy 

## 2015-05-19 ENCOUNTER — Encounter: Payer: Self-pay | Admitting: Internal Medicine

## 2015-05-31 ENCOUNTER — Inpatient Hospital Stay: Admission: RE | Admit: 2015-05-31 | Payer: Self-pay | Source: Ambulatory Visit

## 2015-06-05 ENCOUNTER — Ambulatory Visit
Admission: RE | Admit: 2015-06-05 | Discharge: 2015-06-05 | Disposition: A | Discharge status: Home / Self Care | Payer: BLUE CROSS/BLUE SHIELD | Source: Ambulatory Visit | Attending: Internal Medicine | Admitting: Internal Medicine

## 2015-06-05 ENCOUNTER — Other Ambulatory Visit: Payer: Self-pay | Admitting: Internal Medicine

## 2015-06-05 ENCOUNTER — Telehealth: Payer: Self-pay | Admitting: Internal Medicine

## 2015-06-05 ENCOUNTER — Other Ambulatory Visit (INDEPENDENT_AMBULATORY_CARE_PROVIDER_SITE_OTHER): Payer: BLUE CROSS/BLUE SHIELD

## 2015-06-05 DIAGNOSIS — G43109 Migraine with aura, not intractable, without status migrainosus: Secondary | ICD-10-CM

## 2015-06-05 DIAGNOSIS — R930 Abnormal findings on diagnostic imaging of skull and head, not elsewhere classified: Secondary | ICD-10-CM

## 2015-06-05 DIAGNOSIS — R9089 Other abnormal findings on diagnostic imaging of central nervous system: Secondary | ICD-10-CM

## 2015-06-05 DIAGNOSIS — R42 Dizziness and giddiness: Secondary | ICD-10-CM

## 2015-06-05 LAB — BASIC METABOLIC PANEL
BUN: 18 mg/dL (ref 6–23)
CALCIUM: 9.4 mg/dL (ref 8.4–10.5)
CO2: 31 mEq/L (ref 19–32)
Chloride: 107 mEq/L (ref 96–112)
Creatinine, Ser: 1.34 mg/dL — ABNORMAL HIGH (ref 0.40–1.20)
GFR: 44.39 mL/min — AB (ref >60.00)
GLUCOSE: 85 mg/dL (ref 70–99)
Potassium: 4 mEq/L (ref 3.5–5.1)
SODIUM: 143 meq/L (ref 135–145)

## 2015-06-05 NOTE — Telephone Encounter (Signed)
Patient called in.  Read her the interpretation from dr crawford. Advised of the BMET.   She said that she sees the 06/17/2015 MRI, but is asking if we can find a sooner MRI, even if it is elsewhere. Stanton Kidney, please advise the patient

## 2015-06-09 ENCOUNTER — Other Ambulatory Visit: Payer: Self-pay | Admitting: Internal Medicine

## 2015-06-10 NOTE — Telephone Encounter (Signed)
Pt scheduled for 06/11/15

## 2015-06-11 ENCOUNTER — Ambulatory Visit
Admission: RE | Admit: 2015-06-11 | Discharge: 2015-06-11 | Disposition: A | Discharge status: Home / Self Care | Payer: BLUE CROSS/BLUE SHIELD | Source: Ambulatory Visit | Attending: Internal Medicine | Admitting: Internal Medicine

## 2015-06-11 DIAGNOSIS — R9089 Other abnormal findings on diagnostic imaging of central nervous system: Secondary | ICD-10-CM

## 2015-06-11 MED ORDER — GADOBENATE DIMEGLUMINE 529 MG/ML IV SOLN
20.0000 mL | Freq: Once | INTRAVENOUS | Status: AC | PRN
  Administered 2015-06-11: 20 mL via INTRAVENOUS

## 2015-06-11 NOTE — Telephone Encounter (Signed)
Patient called regarding MRI results. She states that she understands the results, but needs to know a plan of care from this point. Please give the patient a call ASAP.

## 2015-06-12 NOTE — Telephone Encounter (Signed)
Spoke with patient. Waiting on plan of care from Dr. Sharlet Salina and will call patient back.

## 2015-06-17 ENCOUNTER — Other Ambulatory Visit: Payer: BLUE CROSS/BLUE SHIELD

## 2015-06-17 NOTE — Telephone Encounter (Signed)
I would recommend that she see a neurologist to see if they can find out the cause.

## 2015-06-17 NOTE — Telephone Encounter (Signed)
Dr. Sharlet Salina, do you have any suggestions on what to do for this patient. She is concerned about her symptoms, due to the fact that the test didn't show any problems.

## 2015-06-17 NOTE — Addendum Note (Signed)
Addended by: Pricilla Holm A on: 06/17/2015 02:50 PM   Modules accepted: Orders

## 2015-06-17 NOTE — Telephone Encounter (Signed)
Please call pt back today, she is trying to get in touch with our office for 4 days now, not too happy.

## 2015-06-18 NOTE — Telephone Encounter (Signed)
Patient aware and will wait for her neurology appt.

## 2015-07-15 ENCOUNTER — Other Ambulatory Visit (INDEPENDENT_AMBULATORY_CARE_PROVIDER_SITE_OTHER): Payer: BLUE CROSS/BLUE SHIELD

## 2015-07-15 ENCOUNTER — Encounter: Payer: Self-pay | Admitting: Neurology

## 2015-07-15 ENCOUNTER — Ambulatory Visit (INDEPENDENT_AMBULATORY_CARE_PROVIDER_SITE_OTHER): Payer: BLUE CROSS/BLUE SHIELD | Admitting: Neurology

## 2015-07-15 VITALS — BP 114/74 | HR 95 | Ht 66.0 in | Wt 215.0 lb

## 2015-07-15 DIAGNOSIS — R42 Dizziness and giddiness: Secondary | ICD-10-CM | POA: Diagnosis not present

## 2015-07-15 DIAGNOSIS — G43009 Migraine without aura, not intractable, without status migrainosus: Secondary | ICD-10-CM

## 2015-07-15 DIAGNOSIS — E348 Other specified endocrine disorders: Secondary | ICD-10-CM

## 2015-07-15 LAB — BASIC METABOLIC PANEL
BUN: 14 mg/dL (ref 6–23)
CO2: 30 mEq/L (ref 19–32)
CREATININE: 0.8 mg/dL (ref 0.40–1.20)
Calcium: 9.5 mg/dL (ref 8.4–10.5)
Chloride: 106 mEq/L (ref 96–112)
GFR: 80.46 mL/min (ref >60.00)
GLUCOSE: 94 mg/dL (ref 70–99)
Potassium: 3.9 mEq/L (ref 3.5–5.1)
Sodium: 141 mEq/L (ref 135–145)

## 2015-07-15 MED ORDER — TOPIRAMATE 50 MG PO TABS: 50.0000 mg | ORAL_TABLET | Freq: Every day | ORAL | Status: DC

## 2015-07-15 MED ORDER — SUMATRIPTAN SUCCINATE 100 MG PO TABS: ORAL_TABLET | ORAL | Status: DC

## 2015-07-15 NOTE — Patient Instructions (Addendum)
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  Migraine Recommendations: 1.  Start topiramate 50mg  at bedtime.  Call in 4 weeks with update and we can adjust dose if needed.  Please see information about medication below. 2.  Take sumatriptan 100mg  at earliest onset of headache.  May repeat dose once in 2 hours if needed.  Do not exceed two tablets in 24 hours. 3.  Limit use of pain relievers to no more than 2 days out of the week.  These medications include acetaminophen, ibuprofen, triptans and narcotics.  This will help reduce risk of rebound headaches.  I RECOMMEND STOPPING IBUPROFEN AND EXCEDRIN ALL TOGETHER 4.  Be aware of common food triggers such as processed sweets, processed foods with nitrites (such as deli meat, hot dogs, sausages), foods with MSG, alcohol (such as wine), chocolate, certain cheeses, certain fruits (dried fruits, some citrus fruit), vinegar, diet soda. 4.  Avoid caffeine 5.  Routine exercise 6.  Proper sleep hygiene 7.  Stay adequately hydrated with water 8.  Keep a headache diary. 9.  Maintain proper stress management. 10.  Do not skip meals. 11.  Consider supplements:  Magnesium oxide 400mg  to 600mg  daily, riboflavin 400mg , Coenzyme Q 10 100mg  three times daily  Migraine Headache A migraine headache is an intense, throbbing pain on one or both sides of your head. A migraine can last for 30 minutes to several hours. CAUSES  The exact cause of a migraine headache is not always known. However, a migraine may be caused when nerves in the brain become irritated and release chemicals that cause inflammation. This causes pain. Certain things may also trigger migraines, such as:  Alcohol.  Smoking.  Stress.  Menstruation.  Aged cheeses.  Foods or drinks that contain nitrates, glutamate, aspartame, or tyramine.  Lack of sleep.  Chocolate.  Caffeine.  Hunger.  Physical exertion.  Fatigue.  Medicines used to treat chest pain  (nitroglycerine), birth control pills, estrogen, and some blood pressure medicines. SIGNS AND SYMPTOMS  Pain on one or both sides of your head.  Pulsating or throbbing pain.  Severe pain that prevents daily activities.  Pain that is aggravated by any physical activity.  Nausea, vomiting, or both.  Dizziness.  Pain with exposure to bright lights, loud noises, or activity.  General sensitivity to bright lights, loud noises, or smells. Before you get a migraine, you may get warning signs that a migraine is coming (aura). An aura may include:  Seeing flashing lights.  Seeing bright spots, halos, or zigzag lines.  Having tunnel vision or blurred vision.  Having feelings of numbness or tingling.  Having trouble talking.  Having muscle weakness. DIAGNOSIS  A migraine headache is often diagnosed based on:  Symptoms.  Physical exam.  A CT scan or MRI of your head. These imaging tests cannot diagnose migraines, but they can help rule out other causes of headaches. TREATMENT Medicines may be given for pain and nausea. Medicines can also be given to help prevent recurrent migraines.  HOME CARE INSTRUCTIONS  Only take over-the-counter or prescription medicines for pain or discomfort as directed by your health care provider. The use of long-term narcotics is not recommended.  Lie down in a dark, quiet room when you have a migraine.  Keep a journal to find out what may trigger your migraine headaches. For example, write down:  What you eat and drink.  How much sleep you get.  Any change to your diet or medicines.  Limit alcohol  consumption.  Quit smoking if you smoke.  Get 7-9 hours of sleep, or as recommended by your health care provider.  Limit stress.  Keep lights dim if bright lights bother you and make your migraines worse. SEEK IMMEDIATE MEDICAL CARE IF:   Your migraine becomes severe.  You have a fever.  You have a stiff neck.  You have vision  loss.  You have muscular weakness or loss of muscle control.  You start losing your balance or have trouble walking.  You feel faint or pass out.  You have severe symptoms that are different from your first symptoms. MAKE SURE YOU:   Understand these instructions.  Will watch your condition.  Will get help right away if you are not doing well or get worse.   This information is not intended to replace advice given to you by your health care provider. Make sure you discuss any questions you have with your health care provider.   Document Released: 02/08/2005 Document Revised: 03/01/2014 Document Reviewed: 10/16/2012 Elsevier Interactive Patient Education 2016 Else[employer]  Topiramate tablets What is this medicine? TOPIRAMATE (toe PYRE a mate) is used to treat seizures in adults or children with epilepsy. It is also used for the prevention of migraine headaches. This medicine may be used for other purposes; ask your health care provider or pharmacist if you have questions. What should I tell my health care provider before I take this medicine? They need to know if you have any of these conditions: -bleeding disorders -cirrhosis of the liver or liver disease -diarrhea -glaucoma -kidney stones or kidney disease -low blood counts, like low white cell, platelet, or red cell counts -lung disease like asthma, obstructive pulmonary disease, emphysema -metabolic acidosis -on a ketogenic diet -schedule for surgery or a procedure -suicidal thoughts, plans, or attempt; a previous suicide attempt by you or a family member -an unusual or allergic reaction to topiramate, other medicines, foods, dyes, or preservatives -pregnant or trying to get pregnant -breast-feeding How should I use this medicine? Take this medicine by mouth with a glass of water. Follow the directions on the prescription label. Do not crush or chew. You may take this medicine with meals. Take your medicine at regular  intervals. Do not take it more often than directed. Talk to your pediatrician regarding the use of this medicine in children. Special care may be needed. While this drug may be prescribed for children as young as 57 years of age for selected conditions, precautions do apply. Overdosage: If you think you have taken too much of this medicine contact a poison control center or emergency room at once. NOTE: This medicine is only for you. Do not share this medicine with others. What if I miss a dose? If you miss a dose, take it as soon as you can. If your next dose is to be taken in less than 6 hours, then do not take the missed dose. Take the next dose at your regular time. Do not take double or extra doses. What may interact with this medicine? Do not take this medicine with any of the following medications: -probenecid This medicine may also interact with the following medications: -acetazolamide -alcohol -amitriptyline -aspirin and aspirin-like medicines -birth control pills -certain medicines for depression -certain medicines for seizures -certain medicines that treat or prevent blood clots like warfarin, enoxaparin, dalteparin, apixaban, dabigatran, and rivaroxaban -digoxin -hydrochlorothiazide -lithium -medicines for pain, sleep, or muscle relaxation -metformin -methazolamide -NSAIDS, medicines for pain and inflammation, like ibuprofen or  naproxen -pioglitazone -risperidone This list may not describe all possible interactions. Give your health care provider a list of all the medicines, herbs, non-prescription drugs, or dietary supplements you use. Also tell them if you smoke, drink alcohol, or use illegal drugs. Some items may interact with your medicine. What should I watch for while using this medicine? Visit your doctor or health care professional for regular checks on your progress. Do not stop taking this medicine suddenly. This increases the risk of seizures if you are using this  medicine to control epilepsy. Wear a medical identification bracelet or chain to say you have epilepsy or seizures, and carry a card that lists all your medicines. This medicine can decrease sweating and increase your body temperature. Watch for signs of deceased sweating or fever, especially in children. Avoid extreme heat, hot baths, and saunas. Be careful about exercising, especially in hot weather. Contact your health care provider right away if you notice a fever or decrease in sweating. You should drink plenty of fluids while taking this medicine. If you have had kidney stones in the past, this will help to reduce your chances of forming kidney stones. If you have stomach pain, with nausea or vomiting and yellowing of your eyes or skin, call your doctor immediately. You may get drowsy, dizzy, or have blurred vision. Do not drive, use machinery, or do anything that needs mental alertness until you know how this medicine affects you. To reduce dizziness, do not sit or stand up quickly, especially if you are an older patient. Alcohol can increase drowsiness and dizziness. Avoid alcoholic drinks. If you notice blurred vision, eye pain, or other eye problems, seek medical attention at once for an eye exam. The use of this medicine may increase the chance of suicidal thoughts or actions. Pay special attention to how you are responding while on this medicine. Any worsening of mood, or thoughts of suicide or dying should be reported to your health care professional right away. This medicine may increase the chance of developing metabolic acidosis. If left untreated, this can cause kidney stones, bone disease, or slowed growth in children. Symptoms include breathing fast, fatigue, loss of appetite, irregular heartbeat, or loss of consciousness. Call your doctor immediately if you experience any of these side effects. Also, tell your doctor about any surgery you plan on having while taking this medicine since  this may increase your risk for metabolic acidosis. Birth control pills may not work properly while you are taking this medicine. Talk to your doctor about using an extra method of birth control. Women who become pregnant while using this medicine may enroll in the [place] Pregnancy Registry by calling [phone]. This registry collects information about the safety of antiepileptic drug use during pregnancy. What side effects may I notice from receiving this medicine? Side effects that you should report to your doctor or health care professional as soon as possible: -allergic reactions like skin rash, itching or hives, swelling of the face, lips, or tongue -decreased sweating and/or rise in body temperature -depression -difficulty breathing, fast or irregular breathing patterns -difficulty speaking -difficulty walking or controlling muscle movements -hearing impairment -redness, blistering, peeling or loosening of the skin, including inside the mouth -tingling, pain or numbness in the hands or feet -unusual bleeding or bruising -unusually weak or tired -worsening of mood, thoughts or actions of suicide or dying Side effects that usually do not require medical attention (report to your doctor or health care professional if they  continue or are bothersome): -altered taste -back pain, joint or muscle aches and pains -diarrhea, or constipation -headache -loss of appetite -nausea -stomach upset, indigestion -tremors This list may not describe all possible side effects. Call your doctor for medical advice about side effects. You may report side effects to FDA at 1-800-FDA-1088. Where should I keep my medicine? Keep out of the reach of children. Store at room temperature between 15 and 30 degrees C (59 and 86 degrees F) in a tightly closed container. Protect from moisture. Throw away any unused medicine after the expiration date. NOTE: This sheet is a summary. It  may not cover all possible information. If you have questions about this medicine, talk to your doctor, pharmacist, or health care provider.    2016, Elsevier/Gold Standard. (2013-02-12 23:17:57)

## 2015-07-15 NOTE — Progress Notes (Signed)
NEUROLOGY CONSULTATION NOTE  [name] White MRN: [id number] DOB: 1965/12/31  Referring provider: Dr. Sharlet Salina Primary care provider: Dr. Sharlet Salina  Reason for consult:  Migraine, dizziness, pineal gland cyst  HISTORY OF PRESENT ILLNESS: [name] White is a 50 year old right-handed woman who presents for migraine and vertigo.  Labs and imaging of brain MRI.  MIGRAINE: Onset:  Around early 66s Location:  Top of head, band-like distribution Quality:  squeezing Intensity:  7/10 (sometimes 10/10) Aura:  no Prodrome:  no Associated symptoms:  Nausea, photophobia, phonophobia,  Duration:  4 hours or sometimes up to a day Frequency:  3 to 4 times a month Triggers/exacerbating factors:  no Relieving factors:  Sleep in dark Activity:  Needs to lay down to sleep  Past NSAIDS:  ibuprofen Past analgesics:  Tylenol Past abortive triptans:  sumatriptan 25mg  (does not recall ever taking) Past muscle relaxants:no   Past anti-emetic:  Zofran 8mg , Phenergan Past sleep aide:  melatonin, Ambien Past antihypertensive medications:  no Past antidepressant medications:  venlafaxine XR 150mg  (for depression) Past anticonvulsant medications:  gabapentin 1200mg  three times daily, Lyrica 100mg  three times daily (both for neuropathic nerve pain from hip surgery)  Current NSAIDS:  Ibuprofen (was taking daily until 2 weeks ago) Current analgesics:  Excedrin Migraine (daily), tramadol 100mg  (for nerve/hip pain) Current triptans:  no Current anti-emetic:  no Current muscle relaxants:  no Current anti-anxiolytic:  alprazolam Current sleep aide:  alprazolam Current Antihypertensive medications:  no Current Antidepressant medications:  no Current Anticonvulsant medications:  no Current Vitamins/Herbal/Supplements:  no Current Antihistamines/Decongestants:  no Other therapy:  no Other medication:  meclizine  Caffeine:  no Alcohol:  no Smoker:  no Diet:  Increased water, decreased  chocolate Exercise:  no Depression/stress:  Yes (family related, home-related) Sleep hygiene:  poor Family history of headache:  Mother and grandmother had migraines.  Grandmother had aneurysm.  VERTIGO: For the past 6 to 7 months, she reports episodes of dizziness that occur suddenly, usually while walking.  It is not positional.  It is not spinning but rather a wave-like movement.  There is no associated nausea, double vision, focal numbness.  It lasts for a few minutes and resolves.  It occurs twice a day.  She takes meclizine, which is not too effective.  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  BMP from 06/05/15 showed Na 143, K 4, Cl 107, CO2 31, glucose 85, BUN 18 and Cr 1.34.  She had an eye exam 6 months ago, which was normal.  PAST MEDICAL HISTORY: Past Medical History  Diagnosis Date  . Depression   . Barrett's esophagus     egd 2007 + 2009 but none 2011 or 08/2010 egd  . Colon polyp     hyperplastic  . Grief reaction   . Chronic headache   . Tobacco abuse   . Migraine   . Restless leg   . IBS (irritable bowel syndrome)   . Hidradenitis suppurativa   . Ovarian cyst   . OSA (obstructive sleep apnea)     PAST SURGICAL HISTORY: Past Surgical History  Procedure Laterality Date  . Wrist surgery      right  . Cesarean section  x 2  . Cholecystectomy    . Tubal ligation    . Plantar fascitis      right foot  . Abdominal hysterectomy      secondary to endometriosis  . Laparoscopy  04-22-10    lysis of adhesions  . Elbow surgery      left  . Salpingectomy  04-22-10    right  . Laparoscopy  04-30-10     with mini laparotomy and cystotomy repair  . Colonoscopy w/ polypectomy  2012     MEDICATIONS: Current Outpatient Prescriptions on File Prior to Visit  Medication Sig Dispense Refill  . ALPRAZolam (XANAX) 1 MG tablet TAKE ONE-HALF TO ONE TABLET BY MOUTH AT BEDTIME AS NEEDED 30 tablet 2  . meclizine (ANTIVERT) 25 MG tablet TAKE ONE TABLET BY MOUTH THREE TIMES DAILY AS NEEDED FOR  DIZZINESS 60 tablet 0  . traMADol (ULTRAM) 50 MG tablet Take 2 tablets (100 mg total) by mouth 4 (four) times daily. 240 tablet 5   No current facility-administered medications on file prior to visit.    ALLERGIES: Allergies  Allergen Reactions  . Morphine And Related Hives  . Penicillins     REACTION: hives    FAMILY HISTORY: Family History  Problem Relation Age of Onset  . COPD Mother   . Stroke Mother   . Coronary artery disease Mother     pacer  . Colon polyps Mother   . Breast cancer Mother   . Migraines Mother   . Hypertension Father   . Colon polyps Brother   . Colon cancer Maternal Grandfather   . Colon polyps Maternal Grandfather   . Breast cancer Maternal Aunt   . Uterine cancer Cousin     x 2  . Aneurysm Maternal Grandmother     SOCIAL HISTORY: Social History   Social History  . Marital Status: Divorced    Spouse Name: N/A  . Number of Children: 2  . Years of Education: N/A   Occupational History  . unemployed    Social History Main Topics  . Smoking status: Current Every Day Smoker -- 0.50 packs/day for 30 years    Types: Cigarettes  . Smokeless tobacco: Never Used     Comment: BUT NOT READY  . Alcohol Use: No  . Drug Use: No  . Sexual Activity: Not on file   Other Topics Concern  . 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   Divorced    Alcohol use-yes -x 5 per weekend   Current Smoker < 1 ppd       REVIEW OF SYSTEMS: Constitutional: No fevers, chills, or sweats, no generalized fatigue, change in appetite Eyes: No visual changes, double vision, eye pain Ear, nose and  throat: No hearing loss, ear pain, nasal congestion, sore throat Cardiovascular: No chest pain, palpitations Respiratory:  No shortness of breath at rest or with exertion, wheezes GastrointestinaI: No nausea, vomiting, diarrhea, abdominal pain, fecal incontinence Genitourinary:  No dysuria, urinary retention or frequency Musculoskeletal:  Right hip pain.  No neck pain, back pain Integumentary: No rash, pruritus, skin lesions Neurological: as above Psychiatric: depression, insomnia, anxiety Endocrine: No palpitations, fatigue, diaphoresis, mood swings, change in appetite, change in weight, increased thirst Hematologic/Lymphatic:  No purpura, petechiae. Allergic/Immunologic: no itchy/runny eyes, nasal congestion, recent allergic reactions, rashes  PHYSICAL EXAM: Filed Vitals:   07/15/15 0813  BP: 114/74  Pulse: 95   General: No  acute distress.  Patient appears well-groomed.  Head:  Normocephalic/atraumatic Eyes:  fundi examined but not visualized Neck: supple, no paraspinal tenderness, full range of motion Back: No paraspinal tenderness Heart: regular rate and rhythm Lungs: Clear to auscultation bilaterally. Vascular: No carotid bruits. Neurological Exam: Mental status: alert and oriented to person, place, and time, recent and remote memory intact, fund of knowledge intact, attention and concentration intact, speech fluent and not dysarthric, language intact. Cranial nerves: CN I: not tested CN II: pupils equal, round and reactive to light, visual fields intact CN III, IV, VI:  full range of motion, no nystagmus, no ptosis CN V: facial sensation intact CN VII: upper and lower face symmetric CN VIII: hearing intact CN IX, X: gag intact, uvula midline CN XI: sternocleidomastoid and trapezius muscles intact CN XII: tongue midline Bulk & Tone: normal, no fasciculations. Motor:  5/5 throughout Sensation:  Pinprick and vibration sensation intact. Deep Tendon Reflexes:  2+  throughout, toes downgoing.  Finger to nose testing:  Without dysmetria.  Heel to shin:  Without dysmetria.  Gait:  Normal station and stride.  Able to turn and tandem walk. Romberg negative.  IMPRESSION: Migraine without aura Dizziness.  I am not certain what the source of this is.  It is not classic vertigo.  It does not seem to be migraine.  Pineal gland cyst.  I reviewed both the recent MRI and the CT from 2012.  It is present in the 2012 CT and appears stable.  It appears benign and likely an incidental finding.  PLAN: 1.  Will start topiramate 50mg  at bedtime.  We will check another BMP.   2.  Sumatriptan 100mg  for abortive therapy 3.  Stop Excedrin and ibuprofen 4.  Lifestyle modification discussed 5.  Hopefully, treatment of migraine will help with the dizziness.  She may try a Xanax when she has a dizzy spell.  However, since it doesn't last long, it is probably best to just let it run its course. 6.  No further follow up regarding pineal gland cyst, as it appears stable 7.  She is to contact us in 4 weeks with update.  Follow up in 3 to 4 months.  Thank you for allowing me to take part in the care of this patient.  Metta Clines, DO  CC: Pricilla Holm, MD

## 2015-07-29 ENCOUNTER — Telehealth: Payer: Self-pay

## 2015-07-29 MED ORDER — PREDNISONE 10 MG PO TABS: ORAL_TABLET | ORAL | Status: DC

## 2015-07-29 NOTE — Telephone Encounter (Signed)
I request that she remain on the preventative medication at stated dose (topiramate 50mg ) for 4 weeks before determining if we change dose/medication.  It does not always start kicking in right away.  Therefore, I would give it another 2 weeks and she should contact us again at that time.  Usually they only occur 3 to 4 days per month, so they appear more frequent now.  The increased stress is likely the culprit.  If they are daily, we can prescribe her a prednisone 10mg  tablet taper (Take 6tabs x1day, then 5tabs x1day, then 4tabs x1day, then 3tabs x1day, then 2tabs x1day, then 1tab x1day, then STOP.

## 2015-07-29 NOTE — Telephone Encounter (Signed)
Pt left message regarding headaches. Pt was seen 07/15/15 (2 weeks ago). A&P from that date stated, " Will start topiramate 50mg  at bedtime. We will check another BMP. 2. Sumatriptan 100mg  for abortive therapy". States that since that time she has been taking her nightly medication with no relief. Pt also states she has already used all 10 Imitrex that she was given. 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 Please advise.

## 2015-07-29 NOTE — Telephone Encounter (Signed)
Message left on vm to return call.

## 2015-07-29 NOTE — Telephone Encounter (Signed)
RX sent in. Pt aware. Will call back in 2 weeks w/ Topamax update.

## 2015-08-05 ENCOUNTER — Other Ambulatory Visit: Payer: Self-pay | Admitting: Internal Medicine

## 2015-08-06 ENCOUNTER — Telehealth: Payer: Self-pay

## 2015-08-06 MED ORDER — TOPIRAMATE 50 MG PO TABS: 100.0000 mg | ORAL_TABLET | Freq: Every day | ORAL | Status: DC

## 2015-08-06 NOTE — Telephone Encounter (Signed)
Sent to pharmacy 

## 2015-08-06 NOTE — Telephone Encounter (Signed)
Pt called about headache. States you told her they were stress headaches. Pt has had a headache since June 3rd. On June 6th we were informed of headache, and pt was sent in a prednisone taper. Taper was completed. Pt reports that she has had a headache every day, except for June 10 th and half the day June 11th. Only twice has it turned into a migraine, but they are worse headaches than normal. Pt knows that her 4 weeks on the Topamax are not up until next Friday. Pt states the frequency is much much worse. Pt reported having only 3-4 headaches a month, and now has had this continuous one for 2 weeks. Please advise.   [phone]

## 2015-08-06 NOTE — Telephone Encounter (Signed)
I would just increase the topiramate to 100mg  at bedtime.  To try and break this headache, she can come in for a headache cocktail (will need a driver).  Did she try the sumatriptan?  If the sumatriptan 100mg  was ineffective, then we can try the 6mg  [place] injection (may be repeated once after 1 hour if needed (not to exceed 2 injections in 24 hours)

## 2015-08-06 NOTE — Telephone Encounter (Signed)
Pt aware. Rx for Topamax 100 mg sent to pharmacy. Pt also stated she has only taken 1 dose of her imitrex, will take second dose shortly. Will call back tomorrow if ineffective for injectable sumatriptan. Pt does not have a drive.

## 2015-08-07 ENCOUNTER — Ambulatory Visit (INDEPENDENT_AMBULATORY_CARE_PROVIDER_SITE_OTHER): Payer: BLUE CROSS/BLUE SHIELD

## 2015-08-07 DIAGNOSIS — G43809 Other migraine, not intractable, without status migrainosus: Secondary | ICD-10-CM

## 2015-08-07 MED ORDER — DIPHENHYDRAMINE HCL 50 MG/ML IJ SOLN
25.0000 mg | Freq: Once | INTRAMUSCULAR | Status: AC
  Administered 2015-08-07: 25 mg via INTRAMUSCULAR

## 2015-08-07 MED ORDER — DEXTROSE 5 % IV SOLN
10.0000 mg | Freq: Once | INTRAVENOUS | Status: AC
  Administered 2015-08-07: 10 mg via INTRAMUSCULAR

## 2015-08-07 MED ORDER — KETOROLAC TROMETHAMINE 60 MG/2ML IM SOLN
60.0000 mg | Freq: Once | INTRAMUSCULAR | Status: AC
  Administered 2015-08-07: 60 mg via INTRAMUSCULAR

## 2015-08-08 ENCOUNTER — Encounter: Payer: BLUE CROSS/BLUE SHIELD | Attending: Physical Medicine & Rehabilitation

## 2015-08-08 ENCOUNTER — Ambulatory Visit: Payer: BLUE CROSS/BLUE SHIELD | Admitting: Physical Medicine & Rehabilitation

## 2015-08-15 ENCOUNTER — Telehealth: Payer: Self-pay | Admitting: Neurology

## 2015-08-15 DIAGNOSIS — R51 Headache: Principal | ICD-10-CM

## 2015-08-15 DIAGNOSIS — R519 Headache, unspecified: Secondary | ICD-10-CM

## 2015-08-15 NOTE — Telephone Encounter (Signed)
Called and spoke with patient. Complaints still of daily headaches, explained to patient that unfortunately it can take time to get headaches under control. Advised patient to try to stay away from any known triggers, to continue taking medication as it was just increased 1 week ago. Pt stated that she cannot take these headaches much longer. Pt stated that she is also very nauseated, I offered to see if provider would write rx for something for her nausea. Please advise.   Pt also again question why she is having headache.

## 2015-08-15 NOTE — Telephone Encounter (Signed)
[name] White [birth year]. Her number is [phone]. Headaches are every day. She has an appointment for next month, but she feels she cannot wait that long. Not sure what is causing them. I did add her to the wait list. Thank you

## 2015-08-17 NOTE — Telephone Encounter (Signed)
I don't have an answer. I want to check CT and CTA of head for worsening headache. We can prescribe her phenergan 25mg  for nausea.

## 2015-08-18 MED ORDER — PROMETHAZINE HCL 25 MG PO TABS: 25.0000 mg | ORAL_TABLET | Freq: Three times a day (TID) | ORAL | Status: DC | PRN

## 2015-08-18 NOTE — Telephone Encounter (Signed)
Pre-authorization # [id number], good at diagnostic imaging until 09/16/15.

## 2015-08-18 NOTE — Telephone Encounter (Signed)
Referral updated

## 2015-08-18 NOTE — Telephone Encounter (Signed)
CT and CTA will need peer to peer review. Contact number is [phone].   Pt's insurance ID: [id number]

## 2015-08-18 NOTE — Telephone Encounter (Signed)
Phenegran RX sent in. Orders for CTs placed. Pt aware.

## 2015-08-19 ENCOUNTER — Other Ambulatory Visit: Payer: Self-pay | Admitting: Internal Medicine

## 2015-09-01 ENCOUNTER — Ambulatory Visit
Admission: RE | Admit: 2015-09-01 | Discharge: 2015-09-01 | Disposition: A | Discharge status: Home / Self Care | Payer: BLUE CROSS/BLUE SHIELD | Source: Ambulatory Visit | Attending: Neurology | Admitting: Neurology

## 2015-09-01 ENCOUNTER — Other Ambulatory Visit: Payer: BLUE CROSS/BLUE SHIELD

## 2015-09-01 DIAGNOSIS — R519 Headache, unspecified: Secondary | ICD-10-CM

## 2015-09-01 DIAGNOSIS — R51 Headache: Principal | ICD-10-CM

## 2015-09-01 MED ORDER — IOPAMIDOL (ISOVUE-370) INJECTION 76%
80.0000 mL | Freq: Once | INTRAVENOUS | Status: AC | PRN
  Administered 2015-09-01: 80 mL via INTRAVENOUS

## 2015-09-02 ENCOUNTER — Telehealth: Payer: Self-pay

## 2015-09-02 NOTE — Telephone Encounter (Signed)
Sent via mychart

## 2015-09-02 NOTE — Telephone Encounter (Signed)
-----   Message from Pieter Partridge, DO sent at 09/02/2015  6:58 AM EDT ----- CTA of head looks okay.   No aneurysms or other vascular abnormalities

## 2015-09-03 ENCOUNTER — Other Ambulatory Visit: Payer: Self-pay | Admitting: Physical Medicine & Rehabilitation

## 2015-09-03 NOTE — Telephone Encounter (Signed)
Has not been seen since January.  Do you want to refill? Last order was 02/07/15 w/5 refills

## 2015-09-04 ENCOUNTER — Other Ambulatory Visit: Payer: Self-pay | Admitting: Physical Medicine & Rehabilitation

## 2015-09-04 NOTE — Telephone Encounter (Signed)
Have pt schedule f/u appt in 1 mo

## 2015-09-04 NOTE — Telephone Encounter (Signed)
Tramadol called to pharmacy. Message left for patient to schedule an appt for next month to get more refills.

## 2015-09-05 ENCOUNTER — Telehealth: Payer: Self-pay

## 2015-09-05 NOTE — Telephone Encounter (Signed)
Her neurologist prescribes this and she should call them for refill

## 2015-09-05 NOTE — Telephone Encounter (Signed)
Pt called requesting a refill of Meclizine, last Rx'd 06/27 #60. Please advise

## 2015-09-07 ENCOUNTER — Other Ambulatory Visit: Payer: Self-pay | Admitting: Neurology

## 2015-09-07 ENCOUNTER — Other Ambulatory Visit: Payer: Self-pay | Admitting: Internal Medicine

## 2015-09-08 NOTE — Telephone Encounter (Signed)
Dr Sharlet Salina, it looks like this medication was Rx'd at her 03/10 OV with you then refilled 03/10 and again 04/17 by our office.  Thanks!

## 2015-09-08 NOTE — Telephone Encounter (Signed)
Pt advised.

## 2015-09-08 NOTE — Telephone Encounter (Signed)
08/06/15 Telephone note, "  Expand All Collapse All   I would just increase the topiramate to 100mg  at bedtime      "  Last OV: 07/15/15 Next OV: 10/16/15

## 2015-09-08 NOTE — Telephone Encounter (Signed)
Would appear sent in 08/20/15 so not due for refill.

## 2015-09-09 ENCOUNTER — Other Ambulatory Visit: Payer: Self-pay | Admitting: Internal Medicine

## 2015-09-09 ENCOUNTER — Other Ambulatory Visit: Payer: Self-pay | Admitting: Neurology

## 2015-09-09 NOTE — Telephone Encounter (Signed)
Done on 09/08/15, will deny.

## 2015-09-09 NOTE — Telephone Encounter (Signed)
Sent to pharmacy on 09/08/15

## 2015-09-30 ENCOUNTER — Other Ambulatory Visit: Payer: Self-pay

## 2015-09-30 MED ORDER — SUMATRIPTAN SUCCINATE 100 MG PO TABS: ORAL_TABLET | ORAL | 2 refills | Status: DC

## 2015-10-09 ENCOUNTER — Other Ambulatory Visit: Payer: Self-pay | Admitting: Internal Medicine

## 2015-10-09 NOTE — Telephone Encounter (Signed)
Faxed script back to [facility].../lmb 

## 2015-10-15 ENCOUNTER — Telehealth: Payer: Self-pay | Admitting: Pulmonary Disease

## 2015-10-15 NOTE — Telephone Encounter (Signed)
Spoke with pt, requesting refill on her "RLS med" (could not give me name of medication, as well as an order for cpap mask.  I advised pt that we have not seen her since 2013 and she would need to re-establish care with our office in order to receive refills.  Pt scheduled for next available consult with [facility] to re-establish care.  Nothing further needed at this time.

## 2015-10-16 ENCOUNTER — Ambulatory Visit (INDEPENDENT_AMBULATORY_CARE_PROVIDER_SITE_OTHER): Payer: BLUE CROSS/BLUE SHIELD | Admitting: Neurology

## 2015-10-16 ENCOUNTER — Encounter: Payer: Self-pay | Admitting: Neurology

## 2015-10-16 VITALS — BP 112/74 | HR 75 | Ht 66.0 in | Wt 209.0 lb

## 2015-10-16 DIAGNOSIS — G43009 Migraine without aura, not intractable, without status migrainosus: Secondary | ICD-10-CM | POA: Diagnosis not present

## 2015-10-16 DIAGNOSIS — R42 Dizziness and giddiness: Secondary | ICD-10-CM | POA: Diagnosis not present

## 2015-10-16 MED ORDER — NAPROXEN 500 MG PO TABS: 500.0000 mg | ORAL_TABLET | Freq: Two times a day (BID) | ORAL | 2 refills | Status: DC | PRN

## 2015-10-16 MED ORDER — TOPIRAMATE 50 MG PO TABS: 150.0000 mg | ORAL_TABLET | Freq: Every day | ORAL | 2 refills | Status: DC

## 2015-10-16 NOTE — Progress Notes (Signed)
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  She left that job about a month ago and they are improved, although not to the baseline prior to starting that job. Intensity:  7/10 Duration:  All day Frequency:  10 days per month Current NSAIDS:  none Current analgesics:  tramadol 100mg  (for nerve/hip pain) Current triptans:  sumatriptan 100mg  Current anti-emetic:  phenergan Current muscle relaxants:  no Current anti-anxiolytic:  alprazolam Current sleep aide:  alprazolam Current Antihypertensive medications:  no Current Antidepressant medications:  no Current Anticonvulsant medications:  topiramate 100mg  Current Vitamins/Herbal/Supplements:  no Current Antihistamines/Decongestants:  no Other therapy:  no Other medication:  meclizine   CTA of head was personally reviewed and was unremarkable.  HISTORY: MIGRAINE: Onset:  Around early 31s Location:  Top of head, band-like distribution Quality:  squeezing Initial Intensity:  7/10 (sometimes 10/10) Aura:  no Prodrome:  no Associated symptoms:  Nausea, photophobia, phonophobia,  Initial Duration:  4 hours or sometimes up to a day Initial Frequency:  3 to 4 times a month Triggers/exacerbating factors:  no Relieving factors:  Sleep in dark Activity:  Needs to lay down to sleep   Past NSAIDS:  ibuprofen Past analgesics:  Tylenol, Excedrin Past abortive triptans:  sumatriptan 25mg  (does not recall ever taking) Past muscle relaxants:no   Past anti-emetic:  Zofran 8mg , Phenergan Past sleep aide:  melatonin, Ambien Past antihypertensive medications:  no Past antidepressant medications:  venlafaxine XR 150mg  (for depression) Past anticonvulsant medications:  gabapentin 1200mg   three times daily, Lyrica 100mg  three times daily (both for neuropathic nerve pain from hip surgery)     Caffeine:  no Alcohol:  no Smoker:  no Diet:  Increased water, decreased chocolate Exercise:  no Depression/stress:  Yes (family related, home-related) Sleep hygiene:  poor Family history of headache:  Mother and grandmother had migraines.  Grandmother had aneurysm.   VERTIGO: For the past several months, she reports episodes of dizziness that occur suddenly, usually while walking.  It is not positional.  It is not spinning but rather a wave-like movement.  There is no associated nausea, double vision, focal numbness.  It lasts for a few minutes and resolves.  It occurs twice a day.  She takes meclizine, which is not too effective.   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  PAST MEDICAL HISTORY: Past Medical History:  Diagnosis Date  . Barrett's esophagus    egd 2007 + 2009 but none 2011 or 08/2010 egd  . Chronic headache   . Colon polyp    hyperplastic  . Depression   . Grief reaction   . Hidradenitis suppurativa   . IBS (irritable bowel syndrome)   . Migraine   . OSA (obstructive sleep apnea)   . Ovarian cyst   . Restless leg   . Tobacco abuse     MEDICATIONS: Current Outpatient Prescriptions on File Prior to Visit  Medication Sig Dispense Refill  . ALPRAZolam (XANAX) 1 MG  tablet TAKE ONE-HALF TO ONE TABLET BY MOUTH AT BEDTIME AS NEEDED 30 tablet 2  . meclizine (ANTIVERT) 25 MG tablet TAKE ONE TABLET BY MOUTH THREE TIMES DAILY AS NEEDED FOR DIZZINESS 60 tablet 0  . promethazine (PHENERGAN) 25 MG tablet Take 1 tablet (25 mg total) by mouth every 8 (eight) hours as needed for nausea or vomiting. 30  tablet 1  . SUMAtriptan (IMITREX) 100 MG tablet Take 1 at earliest onset of headache.  May repeat once in 2 hours if headache persists or recurs.  Do not exceed 2 tablets in 24 hours 10 tablet 2  . traMADol (ULTRAM) 50 MG tablet TAKE TWO TABLETS BY MOUTH 4 TIMES DAILY 240 tablet 0   No current facility-administered medications on file prior to visit.     ALLERGIES: Allergies  Allergen Reactions  . Morphine And Related Hives  . Penicillins     REACTION: hives    FAMILY HISTORY: Family History  Problem Relation Age of Onset  . COPD Mother   . Stroke Mother   . Coronary artery disease Mother     pacer  . Colon polyps Mother   . Breast cancer Mother   . Migraines Mother   . Hypertension Father   . Colon polyps Brother   . Colon cancer Maternal Grandfather   . Colon polyps Maternal Grandfather   . Breast cancer Maternal Aunt   . Uterine cancer Cousin     x 2  . Aneurysm Maternal Grandmother     SOCIAL HISTORY: Social History   Social History  . Marital status: Divorced    Spouse name: N/A  . Number of children: 2  . Years of education: N/A   Occupational History  . unemployed [place] History Main Topics  . Smoking status: Current Every Day Smoker    Packs/day: 0.50    Years: 30.00    Types: Cigarettes  . Smokeless tobacco: Never Used     Comment: BUT NOT READY  . Alcohol use No  . Drug use: No  . Sexual activity: Not on file   Other Topics Concern  . 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   Divorced    Alcohol use-yes -x 5 per weekend   Current Smoker < 1 ppd       REVIEW OF SYSTEMS: Constitutional: No fevers, chills, or sweats, no generalized fatigue, change in appetite Eyes: No visual changes, double vision, eye pain Ear, nose and throat: No hearing loss, ear pain, nasal congestion, sore throat Cardiovascular: No chest pain, palpitations Respiratory:  No  shortness of breath at rest or with exertion, wheezes GastrointestinaI: No nausea, vomiting, diarrhea, abdominal pain, fecal incontinence Genitourinary:  No dysuria, urinary retention or frequency Musculoskeletal:  No neck pain, back pain Integumentary: No rash, pruritus, skin lesions Neurological: as above Psychiatric: No depression, insomnia, anxiety Endocrine: No palpitations, fatigue, diaphoresis, mood swings, change in appetite, change in weight, increased thirst Hematologic/Lymphatic:  No purpura, petechiae. Allergic/Immunologic: no itchy/runny eyes, nasal congestion, recent allergic reactions, rashes  PHYSICAL EXAM: Vitals:   10/16/15 0740  BP: 112/74  Pulse: 75   General: No acute distress.  Patient appears well-groomed.   Head:  Normocephalic/atraumatic Eyes:  Fundi examined but not visualized Neck: supple, no paraspinal tenderness, full range of motion Heart:  Regular rate and rhythm Lungs:  Clear to auscultation bilaterally Back: No paraspinal tenderness Neurological Exam: alert and  oriented to person, place, and time. Attention span and concentration intact, recent and remote memory intact, fund of knowledge intact.  Speech fluent and not dysarthric, language intact.  CN II-XII intact. Bulk and tone normal, muscle strength 5/5 throughout.  Sensation to pinprick and vibration intact.  Deep tendon reflexes 2+ throughout.  Finger to nose and heel to shin testing intact.  Gait normal, Romberg negative.  IMPRESSION: Migraine without aura Dizziness.  I am not certain what the source of this is.  CTA shows no vertebrobasilar insufficiency.  It is not classic vertigo.  It does not seem to be migraine.  PLAN: 1.  Increase topiramate to 150mg  at bedtime 2.  Will have her take naproxen 500mg  with each dose of sumatriptan 100mg .  If headaches do not abort faster, we will switch to another triptan. 3.  Follow up in 3 months.  15 minutes spent face to face with patient, over 50%  spent counseling.  Metta Clines, DO  CC:  Pricilla Holm, MD

## 2015-10-16 NOTE — Patient Instructions (Signed)
1.  Increase topiramate to 150mg  at bedtime 2.  At earliest onset of headache, take sumatriptan 100mg  with naproxen 500mg .  May repeat dose once in 2 hours if needed.  Do not exceed 2 doses in 24 hours. 3.  Contact us in 4 weeks with update.  Follow up in 3 months.

## 2015-11-19 ENCOUNTER — Other Ambulatory Visit (INDEPENDENT_AMBULATORY_CARE_PROVIDER_SITE_OTHER): Payer: BLUE CROSS/BLUE SHIELD

## 2015-11-19 ENCOUNTER — Ambulatory Visit (INDEPENDENT_AMBULATORY_CARE_PROVIDER_SITE_OTHER): Payer: BLUE CROSS/BLUE SHIELD | Admitting: Internal Medicine

## 2015-11-19 ENCOUNTER — Other Ambulatory Visit: Payer: Self-pay | Admitting: Internal Medicine

## 2015-11-19 VITALS — BP 118/76 | HR 78 | Temp 98.4°F | Resp 20 | Wt 208.2 lb

## 2015-11-19 DIAGNOSIS — R1010 Upper abdominal pain, unspecified: Secondary | ICD-10-CM

## 2015-11-19 DIAGNOSIS — K2270 Barrett's esophagus without dysplasia: Secondary | ICD-10-CM | POA: Diagnosis not present

## 2015-11-19 LAB — BASIC METABOLIC PANEL
BUN: 12 mg/dL (ref 6–23)
CALCIUM: 9.3 mg/dL (ref 8.4–10.5)
CHLORIDE: 110 meq/L (ref 96–112)
CO2: 28 meq/L (ref 19–32)
Creatinine, Ser: 0.92 mg/dL (ref 0.40–1.20)
GFR: 68.38 mL/min (ref >60.00)
GLUCOSE: 91 mg/dL (ref 70–99)
POTASSIUM: 3.8 meq/L (ref 3.5–5.1)
SODIUM: 142 meq/L (ref 135–145)

## 2015-11-19 LAB — CBC WITH DIFFERENTIAL/PLATELET
BASOS ABS: 0 10*3/uL (ref 0.0–0.1)
Basophils Relative: 0.7 % (ref 0.0–3.0)
Eosinophils Absolute: 0.5 10*3/uL (ref 0.0–0.7)
Eosinophils Relative: 7.5 % — ABNORMAL HIGH (ref 0.0–5.0)
HCT: 40.3 % (ref 36.0–46.0)
Hemoglobin: 13.7 g/dL (ref 12.0–15.0)
LYMPHS ABS: 2.6 10*3/uL (ref 0.7–4.0)
Lymphocytes Relative: 39 % (ref 12.0–46.0)
MCHC: 34 g/dL (ref 30.0–36.0)
MCV: 93.4 fl (ref 78.0–100.0)
MONO ABS: 0.5 10*3/uL (ref 0.1–1.0)
MONOS PCT: 7.7 % (ref 3.0–12.0)
NEUTROS ABS: 3 10*3/uL (ref 1.4–7.7)
NEUTROS PCT: 45.1 % (ref 43.0–77.0)
PLATELETS: 185 10*3/uL (ref 150.0–400.0)
RBC: 4.31 Mil/uL (ref 3.87–5.11)
RDW: 12.8 % (ref 11.5–15.5)
WBC: 6.8 10*3/uL (ref 4.0–10.5)

## 2015-11-19 LAB — URINALYSIS, ROUTINE W REFLEX MICROSCOPIC
Bilirubin Urine: NEGATIVE
HGB URINE DIPSTICK: NEGATIVE
Ketones, ur: NEGATIVE
LEUKOCYTES UA: NEGATIVE
Nitrite: NEGATIVE
Specific Gravity, Urine: <=1.005 — AB (ref 1.000–1.030)
Total Protein, Urine: NEGATIVE
Urine Glucose: NEGATIVE
Urobilinogen, UA: 0.2 (ref 0.0–1.0)
pH: 6 (ref 5.0–8.0)

## 2015-11-19 LAB — HEPATIC FUNCTION PANEL
ALBUMIN: 4.1 g/dL (ref 3.5–5.2)
ALK PHOS: 63 U/L (ref 39–117)
ALT: 14 U/L (ref 0–35)
AST: 12 U/L (ref 0–37)
BILIRUBIN DIRECT: 0 mg/dL (ref 0.0–0.3)
TOTAL PROTEIN: 7.1 g/dL (ref 6.0–8.3)
Total Bilirubin: 0.4 mg/dL (ref 0.2–1.2)

## 2015-11-19 LAB — H. PYLORI ANTIBODY, IGG: H PYLORI IGG: POSITIVE — AB

## 2015-11-19 LAB — LIPASE: LIPASE: 6 U/L — AB (ref 11.0–59.0)

## 2015-11-19 MED ORDER — CLARITHROMYCIN 500 MG PO TABS: 500.0000 mg | ORAL_TABLET | Freq: Two times a day (BID) | ORAL | 0 refills | Status: DC

## 2015-11-19 MED ORDER — ONDANSETRON HCL 4 MG PO TABS: 4.0000 mg | ORAL_TABLET | Freq: Three times a day (TID) | ORAL | 1 refills | Status: DC | PRN

## 2015-11-19 MED ORDER — METRONIDAZOLE 500 MG PO TABS: 500.0000 mg | ORAL_TABLET | Freq: Two times a day (BID) | ORAL | 0 refills | Status: DC

## 2015-11-19 MED ORDER — PANTOPRAZOLE SODIUM 40 MG PO TBEC: 40.0000 mg | DELAYED_RELEASE_TABLET | Freq: Every day | ORAL | 3 refills | Status: DC

## 2015-11-19 MED ORDER — PANTOPRAZOLE SODIUM 40 MG PO TBEC: 40.0000 mg | DELAYED_RELEASE_TABLET | Freq: Two times a day (BID) | ORAL | 0 refills | Status: DC

## 2015-11-19 NOTE — Progress Notes (Signed)
Pre visit review using our clinic review tool, if applicable. No additional management support is needed unless otherwise documented below in the visit note. 

## 2015-11-19 NOTE — Progress Notes (Signed)
Subjective:    Patient ID: [name] White, female    DOB: 11/24/1965, 50 y.o.   MRN: [id number]  HPI  Here with  2 wks onset weakness, nausea and epigastric pain, worse in the last 2 days, has hx of IBS with constipatoin but now stools loose,  Denies worsening reflux, dysphagia, other bowel change or blood. No fever, radiation or recent worsening wt loss.  Appetite ok,  S/p ccx.  Does not take nsaids except rarely.  Has not tried antacid of any kind.  Nothing else makes better or worse Wt Readings from Last 3 Encounters:  11/19/15 208 lb 4 oz (94.5 kg)  10/16/15 209 lb (94.8 kg)  07/15/15 215 lb (97.5 kg)   Past Medical History:  Diagnosis Date  . Barrett's esophagus    egd 2007 + 2009 but none 2011 or 08/2010 egd  . Chronic headache   . Colon polyp    hyperplastic  . Depression   . Grief reaction   . Hidradenitis suppurativa   . IBS (irritable bowel syndrome)   . Migraine   . OSA (obstructive sleep apnea)   . Ovarian cyst   . Restless leg   . Tobacco abuse    Past Surgical History:  Procedure Laterality Date  . ABDOMINAL HYSTERECTOMY     secondary to endometriosis  . CESAREAN SECTION     x 2  . CHOLECYSTECTOMY    . COLONOSCOPY W/ POLYPECTOMY  2012  . ELBOW SURGERY     left  . LAPAROSCOPY  04-22-10   lysis of adhesions  . LAPAROSCOPY  04-30-10    with mini laparotomy and cystotomy repair  . plantar fascitis     right foot  . SALPINGECTOMY  04-22-10   right  . TUBAL LIGATION    . WRIST SURGERY     right    reports that she quit smoking about 5 months ago. Her smoking use included Cigarettes. She has a 15.00 pack-year smoking history. She has never used smokeless tobacco. She reports that she does not drink alcohol or use drugs. family history includes Aneurysm in her maternal grandmother; Breast cancer in her maternal aunt and mother; COPD in her mother; Colon cancer in her maternal grandfather; Colon polyps in her brother, maternal grandfather, and mother; Coronary  artery disease in her mother; Hypertension in her father; Migraines in her mother; Stroke in her mother; Uterine cancer in her cousin. Allergies  Allergen Reactions  . Morphine And Related Hives  . Penicillins     REACTION: hives   Current Outpatient Prescriptions on File Prior to Visit  Medication Sig Dispense Refill  . ALPRAZolam (XANAX) 1 MG tablet TAKE ONE-HALF TO ONE TABLET BY MOUTH AT BEDTIME AS NEEDED 30 tablet 2  . meclizine (ANTIVERT) 25 MG tablet TAKE ONE TABLET BY MOUTH THREE TIMES DAILY AS NEEDED FOR DIZZINESS 60 tablet 0  . naproxen (NAPROSYN) 500 MG tablet Take 1 tablet (500 mg total) by mouth every 12 (twelve) hours as needed. 16 tablet 2  . SUMAtriptan (IMITREX) 100 MG tablet Take 1 at earliest onset of headache.  May repeat once in 2 hours if headache persists or recurs.  Do not exceed 2 tablets in 24 hours 10 tablet 2  . topiramate (TOPAMAX) 50 MG tablet Take 3 tablets (150 mg total) by mouth at bedtime. 90 tablet 2   No current facility-administered medications on file prior to visit.    Review of Systems  Constitutional: Negative for unusual diaphoresis or  night sweats HENT: Negative for ear swelling or discharge Eyes: Negative for worsening visual haziness  Respiratory: Negative for choking and stridor.   Gastrointestinal: Negative for distension or worsening eructation Genitourinary: Negative for retention or change in urine volume.  Musculoskeletal: Negative for other MSK pain or swelling Skin: Negative for color change and worsening wound Neurological: Negative for tremors and numbness other than noted  Psychiatric/Behavioral: Negative for decreased concentration or agitation other than above      Objective:   Physical Exam BP 118/76   Pulse 78   Temp 98.4 F (36.9 C) (Oral)   Resp 20   Wt 208 lb 4 oz (94.5 kg)   SpO2 98%   BMI 33.61 kg/m  VS noted,  Constitutional: Pt appears in no apparent distress HENT: Head: NCAT.  Right Ear: External ear normal.   Left Ear: External ear normal.  Eyes: . Pupils are equal, round, and reactive to light. Conjunctivae and EOM are normal Neck: Normal range of motion. Neck supple.  Cardiovascular: Normal rate and regular rhythm.   Pulmonary/Chest: Effort normal and breath sounds without rales or wheezing.  Abd:  Soft,  ND, + BS with epigastric tender, no guarding or rebound Neurological: Pt is alert. Not confused , motor grossly intact Skin: Skin is warm. No rash, no LE edema Psychiatric: Pt behavior is normal. No agitation.     Assessment & Plan:

## 2015-11-19 NOTE — Patient Instructions (Signed)
Please take all new medication as prescribed - the protonix (generic) 40 mg per day, and the zofran for nausea as needed  Please continue all other medications as before  Please have the pharmacy call with any other refills you may need.  Please keep your appointments with your specialists as you may have planned  You will be contacted regarding the referral for: GI - Dr Collene Mares  Please go to the LAB in the Basement (turn left off the elevator) for the tests to be done today  You will be contacted by phone if any changes need to be made immediately.  Otherwise, you will receive a letter about your results with an explanation, but please check with MyChart first.  Please remember to sign up for MyChart if you have not done so, as this will be important to you in the future with finding out test results, communicating by private email, and scheduling acute appointments online when needed.

## 2015-11-21 ENCOUNTER — Encounter: Payer: Self-pay | Admitting: Internal Medicine

## 2015-11-21 ENCOUNTER — Ambulatory Visit (INDEPENDENT_AMBULATORY_CARE_PROVIDER_SITE_OTHER): Payer: BLUE CROSS/BLUE SHIELD | Admitting: Internal Medicine

## 2015-11-21 DIAGNOSIS — K2270 Barrett's esophagus without dysplasia: Secondary | ICD-10-CM

## 2015-11-21 DIAGNOSIS — G2581 Restless legs syndrome: Secondary | ICD-10-CM | POA: Diagnosis not present

## 2015-11-21 DIAGNOSIS — F17200 Nicotine dependence, unspecified, uncomplicated: Secondary | ICD-10-CM | POA: Diagnosis not present

## 2015-11-21 MED ORDER — ROPINIROLE HCL 1 MG PO TABS: 1.0000 mg | ORAL_TABLET | Freq: Every day | ORAL | 3 refills | Status: DC

## 2015-11-21 NOTE — Progress Notes (Signed)
Pre visit review using our clinic review tool, if applicable. No additional management support is needed unless otherwise documented below in the visit note. 

## 2015-11-21 NOTE — Progress Notes (Signed)
   Subjective:    Patient ID: [name] White, female    DOB: 27-Aug-1965, 50 y.o.   MRN: [id number]  HPI The patient is a 50 YO female coming in for restless leg syndrome. She is also very concerned about information she got about recent h pylori test. She is not sure why they checked it but she was told that it increased her risk of cancer and she has family history of colon cancer and has had several panic attacks since that time. She also needs endoscopy and they have not scheduled her until December and this concerns her as well. She was on restless leg syndrome in the past and was on requip for a long time but lost insurance coverage and stopped taking it. She would like to go back on it. She is not sleeping well due to the restless leg. She is using heating pads and this helps for only a short time.   Review of Systems  Constitutional: Negative for activity change, appetite change, fatigue, fever and unexpected weight change.  Respiratory: Negative.   Cardiovascular: Negative.   Gastrointestinal: Positive for nausea. Negative for abdominal distention, abdominal pain, constipation and diarrhea.       Mild nausea after eating.   Musculoskeletal: Negative.   Skin: Negative.   Neurological: Positive for dizziness and light-headedness. Negative for tremors, seizures, syncope, weakness, numbness and headaches.       Restless leg      Objective:   Physical Exam  Constitutional: She is oriented to person, place, and time. She appears well-developed and well-nourished.  HENT:  Head: Normocephalic and atraumatic.  Right Ear: External ear normal.  Left Ear: External ear normal.  Cardiovascular: Normal rate and regular rhythm.   Pulmonary/Chest: Effort normal and breath sounds normal. She has no wheezes. She has no rales.  Abdominal: Soft. Bowel sounds are normal. She exhibits no distension. There is no tenderness. There is no rebound.  Musculoskeletal: She exhibits no edema.  Neurological: She  is alert and oriented to person, place, and time. Coordination normal.  Skin: Skin is warm and dry.  Psychiatric:  Distressed during the visit about recent encounter.    Vitals:   11/21/15 1339  BP: 108/60  Pulse: 84  Resp: 12  Temp: 98.1 F (36.7 C)  TempSrc: Oral  SpO2: 99%  Weight: 209 lb (94.8 kg)  Height: 5\' 6"  (1.676 m)      Assessment & Plan:

## 2015-11-21 NOTE — Assessment & Plan Note (Signed)
Recent h pylori antigen positive. As I explained to her during the visit it may have been inadvertent that she was told she has the infection. We cannot tell if she currently has this infection without further testing. She will have her GI visit to clarify. She is taking the treatment currently and will continue. We also clarified that this increases her risk of ulcers and that rarely ulcers can cause stomach cancer which is not related at all to her family history of colon cancer. She was very concerned about this. Given information from up to date about h pylori infection. Again reiterated that we do not know if she currently has h pylori only that she has had it (or has it) sometime in her lifetime.

## 2015-11-21 NOTE — Assessment & Plan Note (Signed)
Smoking still and cannot quit with this recent news. Talked to her about how smoking does overall increase her risk of cancer. She knows but does not feel able to try to quit now.

## 2015-11-21 NOTE — Assessment & Plan Note (Addendum)
C/w gastritis vs other, for h pylori ab, and labs as ordered,  to f/u any worsening symptoms or concerns, for GI referral Dr Collene Mares per pt reqeust

## 2015-11-21 NOTE — Assessment & Plan Note (Signed)
Rx for requip 1 mg qhs for symptoms. I suspect some of her anxiety today is related to her lack of sleep.

## 2015-11-21 NOTE — Patient Instructions (Signed)
We are sending in requip today for the restless leg syndrome. You can take 1-2 pills at night time for the symptoms. Start with 1 pill and if that is not effective you can increase to 2 pills at night.   We have given you information about the h pylori to help you have the facts. You are on treatment for it but we do not know if you have it currently as we talked about.

## 2015-11-21 NOTE — Assessment & Plan Note (Signed)
For re-start PPI,  to f/u any worsening symptoms or concerns 

## 2015-11-25 ENCOUNTER — Telehealth: Payer: Self-pay | Admitting: Emergency Medicine

## 2015-11-25 NOTE — Telephone Encounter (Signed)
Pt called and has questions about her blood work because there are issues with it. She is also waiting for an endoscopy to be done. She asked that you call her back because she has questions to be answered. Please advise thanks.

## 2015-11-26 NOTE — Telephone Encounter (Signed)
This sounds like hepatitis She needs to be seen

## 2015-11-26 NOTE — Telephone Encounter (Signed)
Pt is having side effects from meds, she wants to know if a nurse can call her back    Best number [phone]

## 2015-11-26 NOTE — Telephone Encounter (Signed)
Patient says she is having side effects from clarithromycin 500 mg. She takes it twice daily to treat h pylori. She says her urine is very dark and she is loosing her voice, dizzy, rapid heart rate, and weakness. She is tired and cannot eat. She is having diarrhea. Stools are light colored. She wants to know if she should quit taking the antibiotic. Please advise in Dr. Charlynne Cousins absence, thanks.

## 2015-11-27 ENCOUNTER — Ambulatory Visit (INDEPENDENT_AMBULATORY_CARE_PROVIDER_SITE_OTHER): Payer: BLUE CROSS/BLUE SHIELD | Admitting: Internal Medicine

## 2015-11-27 VITALS — BP 136/76 | HR 98 | Temp 98.0°F | Resp 20 | Wt 206.0 lb

## 2015-11-27 DIAGNOSIS — K2270 Barrett's esophagus without dysplasia: Secondary | ICD-10-CM | POA: Diagnosis not present

## 2015-11-27 DIAGNOSIS — R768 Other specified abnormal immunological findings in serum: Secondary | ICD-10-CM | POA: Diagnosis not present

## 2015-11-27 DIAGNOSIS — R1010 Upper abdominal pain, unspecified: Secondary | ICD-10-CM

## 2015-11-27 DIAGNOSIS — R7689 Other specified abnormal immunological findings in serum: Secondary | ICD-10-CM | POA: Insufficient documentation

## 2015-11-27 MED ORDER — BISMUTH SUBSALICYLATE 525 MG/15ML PO SUSP: ORAL | 0 refills | Status: DC

## 2015-11-27 MED ORDER — TETRACYCLINE HCL 500 MG PO CAPS: 500.0000 mg | ORAL_CAPSULE | Freq: Four times a day (QID) | ORAL | 0 refills | Status: DC

## 2015-11-27 NOTE — Progress Notes (Signed)
Subjective:    Patient ID: [name] White, female    DOB: 09/28/1965, 50 y.o.   MRN: [id number]  HPI  Here unfortunately with GI upset and "all the side effects listed" on the paper that came with the biaxin, but none of the side effects listed for the PPI or flagyl.  Has seen PCP for RLS recently but per pt was told her H pylori was neg.  Then today was related to her she may have acute hepatitis with a mention of dark urine.  Denies urinary symptoms such as dysuria, frequency, urgency, flank pain, hematuria or n/v, fever, chills.  Brings sample with her today, which is amber and concentrated it appears, but not brown, red or other unusual color.   Pt denies fever, wt loss, night sweats, loss of appetite, or other constitutional symptoms  Pt also not heard yet from [facility] regarding referral to GI Past Medical History:  Diagnosis Date  . Barrett's esophagus    egd 2007 + 2009 but none 2011 or 08/2010 egd  . Chronic headache   . Colon polyp    hyperplastic  . Depression   . Grief reaction   . Hidradenitis suppurativa   . IBS (irritable bowel syndrome)   . Migraine   . OSA (obstructive sleep apnea)   . Ovarian cyst   . Restless leg   . Tobacco abuse    Past Surgical History:  Procedure Laterality Date  . ABDOMINAL HYSTERECTOMY     secondary to endometriosis  . CESAREAN SECTION     x 2  . CHOLECYSTECTOMY    . COLONOSCOPY W/ POLYPECTOMY  2012  . ELBOW SURGERY     left  . LAPAROSCOPY  04-22-10   lysis of adhesions  . LAPAROSCOPY  04-30-10    with mini laparotomy and cystotomy repair  . plantar fascitis     right foot  . SALPINGECTOMY  04-22-10   right  . TUBAL LIGATION    . WRIST SURGERY     right    reports that she quit smoking about 5 months ago. Her smoking use included Cigarettes. She has a 15.00 pack-year smoking history. She has never used smokeless tobacco. She reports that she does not drink alcohol or use drugs. family history includes Aneurysm in her maternal grandmother;  Breast cancer in her maternal aunt and mother; COPD in her mother; Colon cancer in her maternal grandfather; Colon polyps in her brother, maternal grandfather, and mother; Coronary artery disease in her mother; Hypertension in her father; Migraines in her mother; Stroke in her mother; Uterine cancer in her cousin. Allergies  Allergen Reactions  . Morphine And Related Hives  . Penicillins     REACTION: hives   Current Outpatient Prescriptions on File Prior to Visit  Medication Sig Dispense Refill  . ALPRAZolam (XANAX) 1 MG tablet TAKE ONE-HALF TO ONE TABLET BY MOUTH AT BEDTIME AS NEEDED 30 tablet 2  . meclizine (ANTIVERT) 25 MG tablet TAKE ONE TABLET BY MOUTH THREE TIMES DAILY AS NEEDED FOR DIZZINESS 60 tablet 0  . metroNIDAZOLE (FLAGYL) 500 MG tablet Take 1 tablet (500 mg total) by mouth 2 (two) times daily. 28 tablet 0  . naproxen (NAPROSYN) 500 MG tablet Take 1 tablet (500 mg total) by mouth every 12 (twelve) hours as needed. 16 tablet 2  . ondansetron (ZOFRAN) 4 MG tablet Take 1 tablet (4 mg total) by mouth every 8 (eight) hours as needed for nausea or vomiting. 30 tablet 1  .  pantoprazole (PROTONIX) 40 MG tablet Take 1 tablet (40 mg total) by mouth daily. 90 tablet 3  . pantoprazole (PROTONIX) 40 MG tablet Take 1 tablet (40 mg total) by mouth 2 (two) times daily. 28 tablet 0  . rOPINIRole (REQUIP) 1 MG tablet Take 1-2 tablets (1-2 mg total) by mouth at bedtime. 60 tablet 3  . SUMAtriptan (IMITREX) 100 MG tablet Take 1 at earliest onset of headache.  May repeat once in 2 hours if headache persists or recurs.  Do not exceed 2 tablets in 24 hours 10 tablet 2  . topiramate (TOPAMAX) 50 MG tablet Take 3 tablets (150 mg total) by mouth at bedtime. 90 tablet 2   No current facility-administered medications on file prior to visit.    Review of Systems  Constitutional: Negative for unusual diaphoresis or night sweats HENT: Negative for ear swelling or discharge Eyes: Negative for worsening visual  haziness  Respiratory: Negative for choking and stridor.   Gastrointestinal: Negative for distension or worsening eructation Genitourinary: Negative for retention or change in urine volume.  Musculoskeletal: Negative for other MSK pain or swelling Skin: Negative for color change and worsening wound Neurological: Negative for tremors and numbness other than noted  Psychiatric/Behavioral: Negative for decreased concentration or agitation other than above       Objective:   Physical Exam BP 136/76   Pulse 98   Temp 98 F (36.7 C) (Oral)   Resp 20   Wt 206 lb (93.4 kg)   SpO2 91%   BMI 33.25 kg/m  VS noted,  Constitutional: Pt appears in no apparent distress HENT: Head: NCAT.  Right Ear: External ear normal.  Left Ear: External ear normal.  Eyes: . Pupils are equal, round, and reactive to light. Conjunctivae and EOM are normal Neck: Normal range of motion. Neck supple.  Cardiovascular: Normal rate and regular rhythm.   Pulmonary/Chest: Effort normal and breath sounds without rales or wheezing.  Abd:  Soft, mild epigastric tender,  ND, + BS, no guarding or rebound Neurological: Pt is alert. Not confused , motor grossly intact Skin: Skin is warm. No rash, no LE edema Psychiatric: Pt behavior is normal. No agitation.    Ref Range & Units 8d ago  H Pylori IgG Negative Positive    Resulting Agency   [facility]    Specimen Collected: 11/19/15 12:34         Assessment & Plan:

## 2015-11-27 NOTE — Progress Notes (Signed)
Pre visit review using our clinic review tool, if applicable. No additional management support is needed unless otherwise documented below in the visit note. 

## 2015-11-27 NOTE — Assessment & Plan Note (Signed)
Some improved, suspect related to gastritis/reflux, to cont current tx, f/u GI

## 2015-11-27 NOTE — Assessment & Plan Note (Signed)
To continue PPI bid for now, then 1 qd after

## 2015-11-27 NOTE — Telephone Encounter (Signed)
LVM for pt to call back as soon as possible.   RE: needs an OV.

## 2015-11-27 NOTE — Patient Instructions (Signed)
OK to stop the clarithromycin (biaxin)  Please take all new medication as prescribed  - the bismuth, as well as the tetracycline for 1 wk  OK to continue the protonix and the metronidazole as you are taking to finish them up  Please continue all other medications as before, and refills have been done if requested.  Please have the pharmacy call with any other refills you may need.  Please keep your appointments with your specialists as you may have planned

## 2015-11-27 NOTE — Telephone Encounter (Signed)
Pt called back. Gave MD response. Pt agreed to an OV. OV made for 5pm with Dr. [name].

## 2015-11-27 NOTE — Assessment & Plan Note (Signed)
Pt lab result is indeed abnormal, but pt is intolerant of biaxin, but was able to complete one wk of tx, felt better today not taking the baxin, feels better and exam some improved today on triple tx for presumed h pylori gastritis, but will need change to quadruple tx - to change biaxin to bismuth subsalic + tetraclycine, but cont the PPI bid and flagyl, and cont plan for GI referral

## 2015-12-08 ENCOUNTER — Encounter: Payer: Self-pay | Admitting: Internal Medicine

## 2015-12-16 ENCOUNTER — Other Ambulatory Visit: Payer: Self-pay | Admitting: Cardiology

## 2015-12-16 DIAGNOSIS — R079 Chest pain, unspecified: Secondary | ICD-10-CM

## 2015-12-18 ENCOUNTER — Encounter (HOSPITAL_COMMUNITY)
Admission: RE | Admit: 2015-12-18 | Discharge: 2015-12-18 | Disposition: A | Discharge status: Home / Self Care | Payer: BLUE CROSS/BLUE SHIELD | Source: Ambulatory Visit | Attending: Cardiology | Admitting: Cardiology

## 2015-12-18 DIAGNOSIS — R079 Chest pain, unspecified: Secondary | ICD-10-CM | POA: Diagnosis not present

## 2015-12-18 MED ORDER — TECHNETIUM TC 99M TETROFOSMIN IV KIT
30.0000 | PACK | Freq: Once | INTRAVENOUS | Status: AC | PRN
  Administered 2015-12-18: 30 via INTRAVENOUS

## 2015-12-18 MED ORDER — TECHNETIUM TC 99M TETROFOSMIN IV KIT
10.0000 | PACK | Freq: Once | INTRAVENOUS | Status: AC | PRN
  Administered 2015-12-18: 10 via INTRAVENOUS

## 2015-12-18 MED ORDER — REGADENOSON 0.4 MG/5ML IV SOLN
0.4000 mg | Freq: Once | INTRAVENOUS | Status: AC
  Administered 2015-12-18: 0.4 mg via INTRAVENOUS

## 2015-12-18 MED ORDER — REGADENOSON 0.4 MG/5ML IV SOLN
INTRAVENOUS | Status: AC
  Filled 2015-12-18: qty 5

## 2015-12-23 ENCOUNTER — Other Ambulatory Visit: Payer: Self-pay | Admitting: Neurology

## 2015-12-23 ENCOUNTER — Other Ambulatory Visit: Payer: Self-pay | Admitting: Internal Medicine

## 2015-12-23 ENCOUNTER — Other Ambulatory Visit (INDEPENDENT_AMBULATORY_CARE_PROVIDER_SITE_OTHER): Payer: BLUE CROSS/BLUE SHIELD

## 2015-12-23 ENCOUNTER — Encounter: Payer: Self-pay | Admitting: Pulmonary Disease

## 2015-12-23 ENCOUNTER — Ambulatory Visit (INDEPENDENT_AMBULATORY_CARE_PROVIDER_SITE_OTHER): Payer: BLUE CROSS/BLUE SHIELD | Admitting: Pulmonary Disease

## 2015-12-23 VITALS — BP 110/60 | HR 70 | Ht 66.0 in | Wt 217.0 lb

## 2015-12-23 DIAGNOSIS — G4733 Obstructive sleep apnea (adult) (pediatric): Secondary | ICD-10-CM

## 2015-12-23 DIAGNOSIS — D649 Anemia, unspecified: Secondary | ICD-10-CM

## 2015-12-23 DIAGNOSIS — R519 Headache, unspecified: Secondary | ICD-10-CM

## 2015-12-23 DIAGNOSIS — R51 Headache: Principal | ICD-10-CM

## 2015-12-23 LAB — FERRITIN: Ferritin: 64.8 ng/mL (ref 10.0–291.0)

## 2015-12-23 NOTE — Progress Notes (Signed)
Past Surgical History She  has a past surgical history that includes Wrist surgery; Cesarean section; Cholecystectomy; Tubal ligation; plantar fascitis; Abdominal hysterectomy; laparoscopy (04-22-10); Elbow surgery; Salpingectomy (04-22-10); laparoscopy (04-30-10 ); and Colonoscopy w/ polypectomy (2012).  Allergies  Allergen Reactions  . Morphine And Related Hives  . Penicillins     REACTION: hives    Family History Her family history includes Aneurysm in her maternal grandmother; Breast cancer in her maternal aunt and mother; COPD in her mother; Colon cancer in her maternal grandfather; Colon polyps in her brother, maternal grandfather, and mother; Coronary artery disease in her mother; Hypertension in her father; Migraines in her mother; Stroke in her mother; Uterine cancer in her cousin.  Social History She  reports that she quit smoking about 6 months ago. Her smoking use included Cigarettes. She has a 15.00 pack-year smoking history. She has never used smokeless tobacco. She reports that she does not drink alcohol or use drugs.  Review of systems Negative except below  Current Outpatient Prescriptions on File Prior to Visit  Medication Sig  . ALPRAZolam (XANAX) 1 MG tablet TAKE ONE-HALF TO ONE TABLET BY MOUTH AT BEDTIME AS NEEDED  . ondansetron (ZOFRAN) 4 MG tablet Take 1 tablet (4 mg total) by mouth every 8 (eight) hours as needed for nausea or vomiting.  . pantoprazole (PROTONIX) 40 MG tablet Take 1 tablet (40 mg total) by mouth daily.  . pantoprazole (PROTONIX) 40 MG tablet Take 1 tablet (40 mg total) by mouth 2 (two) times daily.  Marland Kitchen rOPINIRole (REQUIP) 1 MG tablet Take 1-2 tablets (1-2 mg total) by mouth at bedtime.  . SUMAtriptan (IMITREX) 100 MG tablet Take 1 at earliest onset of headache.  May repeat once in 2 hours if headache persists or recurs.  Do not exceed 2 tablets in 24 hours  . tetracycline (ACHROMYCIN,SUMYCIN) 500 MG capsule Take 1 capsule (500 mg total) by mouth 4 (four)  times daily.  Marland Kitchen topiramate (TOPAMAX) 50 MG tablet Take 3 tablets (150 mg total) by mouth at bedtime.  . Bismuth Subsalicylate [id number] SUSP 15 ml by mouth four times per day  . meclizine (ANTIVERT) 25 MG tablet TAKE ONE TABLET BY MOUTH THREE TIMES DAILY AS NEEDED FOR DIZZINESS  . metroNIDAZOLE (FLAGYL) 500 MG tablet Take 1 tablet (500 mg total) by mouth 2 (two) times daily. (Patient not taking: Reported on 12/23/2015)  . naproxen (NAPROSYN) 500 MG tablet Take 1 tablet (500 mg total) by mouth every 12 (twelve) hours as needed.   No current facility-administered medications on file prior to visit.     Chief Complaint  Patient presents with  . sleep consult    re-establish patient. Pt needs new mask. pt can't sleep through the night. pt is not on cpap machine. pt states mouth gets really dry at night. pt states she has a cyst on her brain but dr. told her its just fluid filled. pt also states having major chest pains     Sleep tests PSG 10/07/11 >> AHI 17.8, SpO2 low 86%, PLMI 10.2 CPAP 10/26/11 to 11/10/11 >> Used on 12 of 15 nights with average 5 hrs 40 min. Average AHI 3.8 with median CPAP 8 cm H2O and 95 th percentile CPAP 11 cm H2O ONO with CPAP and RA 12/15/11 >> Test time 2 hrs 12 min. Mean SpO2 95%, low SpO2 83%. Spent 16 sec with SpO2 < 88% MSLT with CPAP 01/05/12 >> Mean sleep latency 12 min. Sleep in 5/5 naps, no SOREM's  Past medical history She  has a past medical history of Barrett's esophagus; Chronic headache; Colon polyp; Depression; Grief reaction; Hidradenitis suppurativa; IBS (irritable bowel syndrome); Migraine; OSA (obstructive sleep apnea); Ovarian cyst; Restless leg; and Tobacco abuse.  Vital signs BP 110/60 (BP Location: Right Arm, Cuff Size: Normal)   Pulse 70   Ht 5\' 6"  (1.676 m)   Wt 217 lb (98.4 kg)   SpO2 99%   BMI 35.02 kg/m   History of Present Illness [name] White is a 50 y.o. female for evaluation of sleep problems.  I last saw her in 2013.  At  that time she was being treated for obstructive sleep apnea.  She had persistent daytime sleepiness also.  She also has hx of restless leg syndrome.  She had called asking for refill on requip, and was schedule for office visit to assess current status of her sleep issues.  She lost her insurance in 2013 and therefore couldn't afford CPAP supplies or requip.  She has since got insurance again.  She was having trouble falling asleep and staying asleep.  She has been snoring, and wakes up feeling her mouth dry and she can't catch her breath.  She has to sleep on her side.  She is tired during the day.  She goes to sleep at 11 pm.  She falls asleep after 30 minutes.  She wakes up 1 or 2  times to use the bathroom.  She gets out of bed between 7 and 9 am, depending on how she feels.  She feels tired in the morning.  She still gets morning headaches.  She does not use anything to help her fall sleep or stay awake.  She denies sleep walking, sleep talking, bruxism, or nightmares.  There is no history of restless legs.  She denies sleep hallucinations, sleep paralysis, or cataplexy.  The Epworth score is 14 out of 24.   Physical Exam:  General - No distress ENT - No sinus tenderness, no oral exudate, no LAN, no thyromegaly, TM clear, pupils equal/reactive, MP 3, 2+ tonsils Cardiac - s1s2 regular, no murmur, pulses symmetric Chest - No wheeze/rales/dullness, good air entry, normal respiratory excursion Back - No focal tenderness Abd - Soft, non-tender, no organomegaly, + bowel sounds Ext - No edema Neuro - Normal strength, cranial nerves intact Skin - No rashes Psych - Normal mood, and behavior   CMP Latest Ref Rng & Units 11/19/2015 07/15/2015 06/05/2015  Glucose 70 - 99 mg/dL 91 94 85  BUN 6 - 23 mg/dL 12 14 18   Creatinine 0.40 - 1.20 mg/dL 0.92 0.80 1.34(H)  Sodium 135 - 145 mEq/L 142 141 143  Potassium 3.5 - 5.1 mEq/L 3.8 3.9 4.0  Chloride 96 - 112 mEq/L 110 106 107  CO2 19 - 32 mEq/L 28 30  31   Calcium 8.4 - 10.5 mg/dL 9.3 9.5 9.4  Total Protein 6.0 - 8.3 g/dL 7.1 - -  Total Bilirubin 0.2 - 1.2 mg/dL 0.4 - -  Alkaline Phos 39 - 117 U/L 63 - -  AST 0 - 37 U/L 12 - -  ALT 0 - 35 U/L 14 - -    CBC Latest Ref Rng & Units 11/19/2015 01/20/2015 01/28/2014  WBC 4.0 - 10.5 K/uL 6.8 5.3 5.7  Hemoglobin 12.0 - 15.0 g/dL 13.7 13.6 14.2  Hematocrit 36.0 - 46.0 % 40.3 41.9 42.8  Platelets 150.0 - 400.0 K/uL 185.0 187 188.0    Iron/TIBC/Ferritin/ %Sat    Component Value Date/Time  IRON 91 01/22/2010 1357   FERRITIN 50.1 01/22/2010 1357   IRONPCTSAT 25.3 01/22/2010 1357    Discussion: She has prior history of obstructive sleep apnea and restless leg syndrome.  She was on CPAP therapy, but couldn't afford supplies due to lack of insurance.  She was having trouble falling asleep and staying asleep.  She also has snoring, apnea, and daytime sleepiness.  She has since resumed requip, but her sleep issues persists.  She is also having persistent headaches.  Assessment/plan:  Obstructive sleep apnea. - will arrange for home sleep study to further assess current status of sleep apnea  Restless leg syndrome. - will check iron, TIBC and ferritin - continue requip for now  Daytime sleepiness. - explained how some of her other medications could be contributing to this >> topamax, xanax   Patient Instructions  Lab tests today  Will schedule home sleep study  Will call to schedule follow up after sleep study reviewed    Chesley Mires, M.D. Pager [phone] 12/23/2015, 3:11 PM

## 2015-12-23 NOTE — Patient Instructions (Signed)
Lab tests today  Will schedule home sleep study  Will call to schedule follow up after sleep study reviewed

## 2015-12-24 ENCOUNTER — Telehealth: Payer: Self-pay | Admitting: Pulmonary Disease

## 2015-12-24 LAB — IRON AND TIBC
%SAT: 30 % (ref 11–50)
Iron: 89 ug/dL (ref 45–160)
TIBC: 301 ug/dL (ref 250–450)
UIBC: 212 ug/dL (ref 125–400)

## 2015-12-24 NOTE — Telephone Encounter (Signed)
Faxed to pharmacy

## 2015-12-24 NOTE — Telephone Encounter (Signed)
Spoke with pt. She is aware of her results. Nothing further was needed. 

## 2015-12-24 NOTE — Telephone Encounter (Signed)
Faxed to pharmacy,

## 2015-12-24 NOTE — Telephone Encounter (Signed)
Iron/TIBC/Ferritin/ %Sat    Component Value Date/Time   IRON 89 12/23/2015 1525   TIBC 301 12/23/2015 1525   FERRITIN 64.8 12/23/2015 1525   IRONPCTSAT 30 12/23/2015 1525    Will have my nurse inform patient that iron levels were okay.  No change to current treatment plan needed.

## 2015-12-25 ENCOUNTER — Other Ambulatory Visit: Payer: Self-pay | Admitting: Gastroenterology

## 2015-12-25 DIAGNOSIS — R079 Chest pain, unspecified: Secondary | ICD-10-CM

## 2015-12-25 DIAGNOSIS — R1013 Epigastric pain: Secondary | ICD-10-CM

## 2015-12-25 DIAGNOSIS — R198 Other specified symptoms and signs involving the digestive system and abdomen: Secondary | ICD-10-CM

## 2015-12-30 ENCOUNTER — Other Ambulatory Visit: Payer: BLUE CROSS/BLUE SHIELD

## 2016-01-02 ENCOUNTER — Telehealth: Payer: Self-pay | Admitting: Internal Medicine

## 2016-01-02 MED ORDER — LIDOCAINE VISCOUS 2 % MT SOLN: 20.0000 mL | OROMUCOSAL | 2 refills | Status: DC | PRN

## 2016-01-02 NOTE — Telephone Encounter (Signed)
Patient states she had seen Dr. Collene Mares (GI).  States she can not get any calls back from Dr. Lorie Apley office but is not feeling any better.  She states Dr. Olevia Perches had her on liquid lidocaine and that helped to relieve the pressure she was having in her chest.  She would like to know if Dr. Sharlet Salina could prescribe her this?  States Dr. Collene Mares was suppose to send notes to Dr. Sharlet Salina.  Patient states she has also seen Dr.Harwani and a chemical stress test was done  and states that she is fine there.  States Dr. Collene Mares wanted her to have a CT but insurance did not approve it and has not heard anything back since.

## 2016-01-02 NOTE — Telephone Encounter (Signed)
Patient aware.

## 2016-01-02 NOTE — Telephone Encounter (Signed)
Sent in the liquid

## 2016-01-14 DIAGNOSIS — G4733 Obstructive sleep apnea (adult) (pediatric): Secondary | ICD-10-CM

## 2016-01-20 ENCOUNTER — Other Ambulatory Visit: Payer: Self-pay | Admitting: *Deleted

## 2016-01-20 ENCOUNTER — Telehealth: Payer: Self-pay | Admitting: Pulmonary Disease

## 2016-01-20 DIAGNOSIS — G4733 Obstructive sleep apnea (adult) (pediatric): Secondary | ICD-10-CM

## 2016-01-20 NOTE — Telephone Encounter (Signed)
HST 01/14/16 >> AHI 9.2, SaO2 low 82%   Will have my nurse inform pt that sleep study shows mild sleep apnea.  Options are 1) CPAP now, 2) ROV first.  If She is agreeable to CPAP, then please send order for auto CPAP range 5 to 15 cm H2O with heated humidity and mask of choice.  Have download sent 1 month after starting CPAP and set up ROV 2 months after starting CPAP.  ROV can be with me or NP.

## 2016-01-23 ENCOUNTER — Telehealth: Payer: Self-pay | Admitting: Internal Medicine

## 2016-01-23 NOTE — Telephone Encounter (Signed)
Patient is requesting a refill of protonix be sent to [facility] on [address]. She states that dr [name] told her that she should be taking the medication twice per day. The prescription states once per day.

## 2016-01-23 NOTE — Telephone Encounter (Signed)
Left message advising patient of dr crawfords note/instructions---call back if any questions

## 2016-01-23 NOTE — Telephone Encounter (Signed)
She should have refills of this still. She was only supposed to do twice a day for 2 weeks then go back to daily only.

## 2016-01-26 ENCOUNTER — Ambulatory Visit: Payer: BLUE CROSS/BLUE SHIELD | Admitting: Gastroenterology

## 2016-01-26 ENCOUNTER — Ambulatory Visit: Payer: BLUE CROSS/BLUE SHIELD | Admitting: Neurology

## 2016-01-27 ENCOUNTER — Ambulatory Visit (INDEPENDENT_AMBULATORY_CARE_PROVIDER_SITE_OTHER): Payer: BLUE CROSS/BLUE SHIELD | Admitting: Neurology

## 2016-01-27 ENCOUNTER — Encounter: Payer: Self-pay | Admitting: Neurology

## 2016-01-27 ENCOUNTER — Other Ambulatory Visit: Payer: Self-pay | Admitting: Neurology

## 2016-01-27 VITALS — BP 108/60 | HR 83 | Ht 66.0 in | Wt 205.1 lb

## 2016-01-27 DIAGNOSIS — G43009 Migraine without aura, not intractable, without status migrainosus: Secondary | ICD-10-CM

## 2016-01-27 DIAGNOSIS — R42 Dizziness and giddiness: Secondary | ICD-10-CM | POA: Diagnosis not present

## 2016-01-27 MED ORDER — NAPROXEN 500 MG PO TABS: 500.0000 mg | ORAL_TABLET | Freq: Two times a day (BID) | ORAL | 5 refills | Status: DC | PRN

## 2016-01-27 NOTE — Progress Notes (Signed)
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 Intensity:  4/10 Duration:  30 minutes Frequency:  3 days per month Current NSAIDS:  naproxen 500mg  (with sumatriptan) Current analgesics:  tramadol 100mg  (for nerve/hip pain) Current triptans:  sumatriptan 100mg  (with naproxen 500mg ) Current anti-emetic:  phenergan Current muscle relaxants:  no Current anti-anxiolytic:  alprazolam Current sleep aide:  alprazolam Current Antihypertensive medications:  no Current Antidepressant medications:  no Current Anticonvulsant medications:  topiramate 150mg  Current Vitamins/Herbal/Supplements:  no Current Antihistamines/Decongestants:  no Other therapy:  no Other medication:  meclizine      HISTORY: MIGRAINE: Onset:  Around early 63s Location:  Top of head, band-like distribution Quality:  squeezing Initial Intensity:  7/10 (sometimes 10/10); August: 7/10 Aura:  no Prodrome:  no Associated symptoms:  Nausea, photophobia, phonophobia,  Initial Duration:  4 hours or sometimes up to a day; August: all day Initial Frequency:  3 to 4 times a month; August: 10 days per month. Triggers/exacerbating factors:  no Relieving factors:  Sleep in dark Activity:  Needs to lay down to sleep   Past NSAIDS:  ibuprofen Past analgesics:  Tylenol, Excedrin Past abortive triptans:  sumatriptan 25mg  (does not recall ever taking) Past muscle relaxants:no   Past anti-emetic:  Zofran 8mg , Phenergan Past sleep aide:  melatonin, Ambien Past antihypertensive medications:  no Past antidepressant medications:  venlafaxine XR 150mg  (for depression) Past anticonvulsant medications:  gabapentin 1200mg  three times daily, Lyrica 100mg  three times daily (both for neuropathic nerve pain from hip surgery)     Caffeine:  no Alcohol:   no Smoker:  no Diet:  Increased water, decreased chocolate Exercise:  no Depression/stress:  Yes (family related, home-related) Sleep hygiene:  poor Family history of headache:  Mother and grandmother had migraines.  Grandmother had aneurysm.   DIZZINESS: For the past several months, she reports episodes of dizziness that occur suddenly, usually while walking.  It is not positional.  It is not spinning but rather a wave-like movement.  There is no associated nausea, double vision, focal numbness.  It lasts for a few minutes and resolves.  It occurs twice a day.  She takes meclizine, which is not too effective.   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  CTA of head was personally reviewed and was unremarkable.  PAST MEDICAL HISTORY: Past Medical History:  Diagnosis Date  . Barrett's esophagus    egd 2007 + 2009 but none 2011 or 08/2010 egd  . Chronic headache   . Colon polyp    hyperplastic  . Depression   . Grief reaction   . Hidradenitis suppurativa   . IBS (irritable bowel syndrome)   . Migraine   . OSA (obstructive sleep apnea)   . Ovarian cyst   . Restless leg   . Tobacco abuse     MEDICATIONS: Current Outpatient Prescriptions on File Prior to Visit  Medication Sig Dispense Refill  . ALPRAZolam (XANAX) 1 MG tablet TAKE ONE-HALF TO ONE TABLET BY MOUTH AT BEDTIME AS NEEDED 30 tablet 2  . Bismuth Subsalicylate  525 MG/15ML SUSP 15 ml by mouth four times per day 420 mL 0  . lidocaine (XYLOCAINE) 2 % solution Use as directed 20 mLs in the mouth or throat as needed for mouth pain. 200 mL 2  . meclizine (ANTIVERT) 25 MG tablet TAKE ONE TABLET BY MOUTH THREE TIMES DAILY AS NEEDED FOR DIZZINESS 60 tablet 0  .  metroNIDAZOLE (FLAGYL) 500 MG tablet Take 1 tablet (500 mg total) by mouth 2 (two) times daily. 28 tablet 0  . ondansetron (ZOFRAN) 4 MG tablet Take 1 tablet (4 mg total) by mouth every 8 (eight) hours as needed for nausea or vomiting. 30 tablet 1  . pantoprazole (PROTONIX) 40 MG tablet Take 1 tablet (40 mg total) by mouth daily. 90 tablet 3  . promethazine (PHENERGAN) 25 MG tablet TAKE ONE TABLET BY MOUTH EVERY 8 HOURS AS NEEDED FOR NAUSEA OR  VOMITING 30 tablet 1  . rOPINIRole (REQUIP) 1 MG tablet Take 1-2 tablets (1-2 mg total) by mouth at bedtime. 60 tablet 3  . SUMAtriptan (IMITREX) 100 MG tablet Take 1 at earliest onset of headache.  May repeat once in 2 hours if headache persists or recurs.  Do not exceed 2 tablets in 24 hours 10 tablet 2  . tetracycline (ACHROMYCIN,SUMYCIN) 500 MG capsule Take 1 capsule (500 mg total) by mouth 4 (four) times daily. 28 capsule 0  . topiramate (TOPAMAX) 50 MG tablet Take 3 tablets (150 mg total) by mouth at bedtime. 90 tablet 2   No current facility-administered medications on file prior to visit.     ALLERGIES: Allergies  Allergen Reactions  . Morphine And Related Hives  . Penicillins     REACTION: hives    FAMILY HISTORY: Family History  Problem Relation Age of Onset  . COPD Mother   . Stroke Mother   . Coronary artery disease Mother     pacer  . Colon polyps Mother   . Breast cancer Mother   . Migraines Mother   . Hypertension Father   . Colon polyps Brother   . Colon cancer Maternal Grandfather   . Colon polyps Maternal Grandfather   . Breast cancer Maternal Aunt   . Uterine cancer Cousin     x 2  . Aneurysm Maternal Grandmother     SOCIAL HISTORY: Social History   Social History  . Marital status: Divorced    Spouse name: N/A  . Number of children: 2  . Years of education: N/A   Occupational History  . unemployed [place] History Main Topics  . Smoking status: Former Smoker    Packs/day: 0.50     Years: 30.00    Types: Cigarettes    Quit date: 06/21/2015  . Smokeless tobacco: Never Used     Comment: BUT NOT READY  . Alcohol use No  . Drug use: No  . Sexual activity: Not on file   Other Topics Concern  . 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 Genitourinary:  No dysuria, urinary retention or frequency Musculoskeletal:  No neck pain, back pain Integumentary: No rash, pruritus, skin lesions Neurological: as above Psychiatric: No depression, insomnia, anxiety Endocrine: No palpitations, fatigue, diaphoresis, mood swings, change in appetite, change in weight, increased thirst Hematologic/Lymphatic:  No purpura, petechiae. Allergic/Immunologic: no itchy/runny eyes, nasal congestion, recent allergic reactions, rashes  PHYSICAL EXAM: Vitals:   01/27/16 1135  BP: 108/60  Pulse: 83   General: No acute distress.  Patient appears well-groomed.   Head:  Normocephalic/atraumatic Eyes:  Fundi examined but not visualized Neck: supple, no paraspinal tenderness, full range of motion Heart:  Regular rate and rhythm Lungs:  Clear to auscultation bilaterally Back: No paraspinal tenderness Neurological Exam: alert and oriented to person, place, and time. Attention span and concentration intact, recent and remote memory intact, fund of knowledge intact.  Speech fluent and not dysarthric, language intact.  CN II-XII intact. Bulk and tone normal, muscle  strength 5/5 throughout.  Sensation to light touch  intact.  Deep tendon reflexes 2+ throughout.  Finger to nose testing intact.  Gait normal.  IMPRESSION: Migraine without aura, improved Dizziness, possibly inner ear.  I don't suspect neurologic etiology.  PLAN: 1.  Continue topiramate 150mg  at bedtime 2.  Sumatriptan 100mg  with naproxen 500mg  as needed. 3.  Follow up in 6 months.  19 minutes spent face to face with patient, over 50% spent discussing progress of treatment and reviewing treatment protocol.  Metta Clines, DO  CC:  Pricilla Holm, MD

## 2016-01-27 NOTE — Patient Instructions (Signed)
Migraine Recommendations: 1.  Continue topiramate 150mg  at bedtime 2.  Take sumatriptan 100mg  with naproxen 500mg  at earliest onset of headache.  May repeat dose once after 2 hours if needed.  Do not exceed two doses in 24 hours. 3.  Limit use of pain relievers to no more than 2 days out of the week.  These medications include acetaminophen, ibuprofen, triptans and narcotics.  This will help reduce risk of rebound headaches. 4.  Be aware of common food triggers such as processed sweets, processed foods with nitrites (such as deli meat, hot dogs, sausages), foods with MSG, alcohol (such as wine), chocolate, certain cheeses, certain fruits (dried fruits, some citrus fruit), vinegar, diet soda. 4.  Avoid caffeine 5.  Routine exercise 6.  Proper sleep hygiene 7.  Stay adequately hydrated with water 8.  Keep a headache diary. 9.  Maintain proper stress management. 10.  Do not skip meals. 11.  Consider supplements:  Magnesium citrate 400mg  to 600mg  daily, riboflavin 400mg , Coenzyme Q 10 100mg  three times daily 12.  Follow up in  6 months.

## 2016-01-28 ENCOUNTER — Other Ambulatory Visit: Payer: Self-pay | Admitting: Neurology

## 2016-01-30 NOTE — Telephone Encounter (Signed)
Results have been explained to patient, pt expressed understanding.  Order placed for CPAP  Appt scheduled with [facility] on  04/01/16 at 11:00. Nothing further needed.

## 2016-02-03 ENCOUNTER — Encounter: Payer: Self-pay | Admitting: Neurology

## 2016-02-09 ENCOUNTER — Other Ambulatory Visit: Payer: BLUE CROSS/BLUE SHIELD

## 2016-02-09 ENCOUNTER — Encounter: Payer: Self-pay | Admitting: Internal Medicine

## 2016-02-09 ENCOUNTER — Ambulatory Visit (INDEPENDENT_AMBULATORY_CARE_PROVIDER_SITE_OTHER): Payer: BLUE CROSS/BLUE SHIELD | Admitting: Internal Medicine

## 2016-02-09 VITALS — BP 108/64 | HR 81 | Temp 98.2°F | Resp 16 | Ht 66.0 in | Wt 204.0 lb

## 2016-02-09 DIAGNOSIS — N3000 Acute cystitis without hematuria: Secondary | ICD-10-CM | POA: Diagnosis not present

## 2016-02-09 DIAGNOSIS — R300 Dysuria: Secondary | ICD-10-CM

## 2016-02-09 LAB — POC URINALSYSI DIPSTICK (AUTOMATED)
Bilirubin, UA: NEGATIVE
Blood, UA: NEGATIVE
GLUCOSE UA: NEGATIVE
Ketones, UA: NEGATIVE
LEUKOCYTES UA: NEGATIVE
NITRITE UA: NEGATIVE
PROTEIN UA: POSITIVE
Spec Grav, UA: 1.015
UROBILINOGEN UA: 0.2
pH, UA: 7.5

## 2016-02-09 MED ORDER — SULFAMETHOXAZOLE-TRIMETHOPRIM 800-160 MG PO TABS: 1.0000 | ORAL_TABLET | Freq: Two times a day (BID) | ORAL | 0 refills | Status: DC

## 2016-02-09 NOTE — Progress Notes (Signed)
Pre visit review using our clinic review tool, if applicable. No additional management support is needed unless otherwise documented below in the visit note. 

## 2016-02-09 NOTE — Patient Instructions (Signed)

## 2016-02-09 NOTE — Progress Notes (Signed)
Subjective:  Patient ID: [name] White, female    DOB: 06-Feb-1966  Age: 50 y.o. MRN: [id number]  CC: Urinary Tract Infection (urine cloudy, pressure on bladder 1 wk. took zso.)   HPI [name] White presents for a one-week history of chills, dysuria, bladder pain, and nausea, but no fever, vomiting, diarrhea, constipation, loss of appetite. She wants to be checked for gonorrhea and Chlamydia because she doesn't trust her boyfriend.  Outpatient Medications Prior to Visit  Medication Sig Dispense Refill  . ALPRAZolam (XANAX) 1 MG tablet TAKE ONE-HALF TO ONE TABLET BY MOUTH AT BEDTIME AS NEEDED 30 tablet 2  . Bismuth Subsalicylate [id number] SUSP 15 ml by mouth four times per day 420 mL 0  . lidocaine (XYLOCAINE) 2 % solution Use as directed 20 mLs in the mouth or throat as needed for mouth pain. 200 mL 2  . meclizine (ANTIVERT) 25 MG tablet TAKE ONE TABLET BY MOUTH THREE TIMES DAILY AS NEEDED FOR DIZZINESS 60 tablet 0  . naproxen (NAPROSYN) 500 MG tablet Take 1 tablet (500 mg total) by mouth every 12 (twelve) hours as needed. 16 tablet 5  . ondansetron (ZOFRAN) 4 MG tablet Take 1 tablet (4 mg total) by mouth every 8 (eight) hours as needed for nausea or vomiting. 30 tablet 1  . pantoprazole (PROTONIX) 40 MG tablet Take 1 tablet (40 mg total) by mouth daily. 90 tablet 3  . promethazine (PHENERGAN) 25 MG tablet TAKE ONE TABLET BY MOUTH EVERY 8 HOURS AS NEEDED FOR NAUSEA OR  VOMITING 30 tablet 1  . rOPINIRole (REQUIP) 1 MG tablet Take 1-2 tablets (1-2 mg total) by mouth at bedtime. 60 tablet 3  . SUMAtriptan (IMITREX) 100 MG tablet Take 1 at earliest onset of headache.  May repeat once in 2 hours if headache persists or recurs.  Do not exceed 2 tablets in 24 hours 10 tablet 2  . topiramate (TOPAMAX) 50 MG tablet TAKE THREE TABLETS BY MOUTH AT BEDTIME 90 tablet 2  . metroNIDAZOLE (FLAGYL) 500 MG tablet Take 1 tablet (500 mg total) by mouth 2 (two) times daily. (Patient not taking: Reported on  02/09/2016) 28 tablet 0  . tetracycline (ACHROMYCIN,SUMYCIN) 500 MG capsule Take 1 capsule (500 mg total) by mouth 4 (four) times daily. (Patient not taking: Reported on 02/09/2016) 28 capsule 0   No facility-administered medications prior to visit.     ROS Review of Systems  Constitutional: Positive for chills. Negative for diaphoresis, fatigue and fever.  HENT: Negative.   Eyes: Negative.   Respiratory: Negative for cough and shortness of breath.   Cardiovascular: Negative for chest pain, palpitations and leg swelling.  Gastrointestinal: Negative for abdominal pain, blood in stool, constipation, diarrhea, nausea and vomiting.  Endocrine: Negative.   Genitourinary: Positive for dysuria and frequency. Negative for decreased urine volume, difficulty urinating, flank pain, hematuria, pelvic pain, urgency, vaginal bleeding, vaginal discharge and vaginal pain.  Musculoskeletal: Negative.  Negative for back pain and neck pain.  Skin: Negative.  Negative for color change and rash.  Allergic/Immunologic: Negative.   Neurological: Negative.  Negative for dizziness.  Hematological: Negative.  Negative for adenopathy. Does not bruise/bleed easily.  Psychiatric/Behavioral: Negative.     Objective:  BP 108/64   Pulse 81   Temp 98.2 F (36.8 C)   Resp 16   Ht 5\' 6"  (1.676 m)   Wt 204 lb (92.5 kg)   SpO2 100%   BMI 32.93 kg/m   BP Readings from Last 3  Encounters:  02/09/16 108/64  01/27/16 108/60  12/23/15 110/60    Wt Readings from Last 3 Encounters:  02/09/16 204 lb (92.5 kg)  01/27/16 205 lb 1 oz (93 kg)  12/23/15 217 lb (98.4 kg)    Physical Exam  Constitutional: She is oriented to person, place, and time. No distress.  HENT:  Mouth/Throat: Oropharynx is clear and moist. No oropharyngeal exudate.  Eyes: Conjunctivae are normal. Right eye exhibits no discharge. Left eye exhibits no discharge. No scleral icterus.  Neck: Normal range of motion. Neck supple. No JVD present. No  tracheal deviation present. No thyromegaly present.  Cardiovascular: Normal rate, regular rhythm, normal heart sounds and intact distal pulses.  Exam reveals no gallop and no friction rub.   No murmur heard. Pulmonary/Chest: Effort normal and breath sounds normal. No stridor. No respiratory distress. She has no wheezes. She has no rales. She exhibits no tenderness.  Abdominal: Soft. Normal appearance and bowel sounds are normal. She exhibits no distension and no mass. There is no hepatosplenomegaly, splenomegaly or hepatomegaly. There is no tenderness. There is no rebound, no guarding and no CVA tenderness. No hernia.  Musculoskeletal: Normal range of motion. She exhibits no edema, tenderness or deformity.  Lymphadenopathy:    She has no cervical adenopathy.  Neurological: She is oriented to person, place, and time.  Skin: Skin is warm and dry. No rash noted. She is not diaphoretic. No erythema. No pallor.  Vitals reviewed.   Lab Results  Component Value Date   WBC 6.8 11/19/2015   HGB 13.7 11/19/2015   HCT 40.3 11/19/2015   PLT 185.0 11/19/2015   GLUCOSE 91 11/19/2015   CHOL 246 (H) 08/20/2014   TRIG 156 (H) 08/20/2014   HDL 63 08/20/2014   LDLDIRECT 160.5 08/30/2012   LDLCALC 152 (H) 08/20/2014   ALT 14 11/19/2015   AST 12 11/19/2015   NA 142 11/19/2015   K 3.8 11/19/2015   CL 110 11/19/2015   CREATININE 0.92 11/19/2015   BUN 12 11/19/2015   CO2 28 11/19/2015   TSH 1.03 01/28/2014   INR 1.07 04/30/2010    Nm Myocar Multi W/spect W/wall Motion / Ef  Result Date: 12/18/2015 CLINICAL DATA:  Chest tightness and short of breath. EXAM: MYOCARDIAL IMAGING WITH SPECT (REST AND PHARMACOLOGIC-STRESS) GATED LEFT VENTRICULAR WALL MOTION STUDY LEFT VENTRICULAR EJECTION FRACTION TECHNIQUE: Standard myocardial SPECT imaging was performed after resting intravenous injection of 10 mCi Tc-48m tetrofosmin. Subsequently, intravenous infusion of Lexiscan was performed under the supervision of  the Cardiology staff. At peak effect of the drug, 30 mCi Tc-38m tetrofosmin was injected intravenously and standard myocardial SPECT imaging was performed. Quantitative gated imaging was also performed to evaluate left ventricular wall motion, and estimate left ventricular ejection fraction. COMPARISON:  09/25/2013 chest CT. FINDINGS: Perfusion: No decreased activity in the left ventricle on stress imaging to suggest reversible ischemia or infarction. Wall Motion: Normal left ventricular wall motion. No left ventricular dilation. Left Ventricular Ejection Fraction: 63 % End diastolic volume 66 ml End systolic volume 24 ml IMPRESSION: 1. No reversible ischemia or infarction. 2. Normal left ventricular wall motion. 3. Left ventricular ejection fraction 63% 4. Non invasive risk stratification*: Low *2012 Appropriate Use Criteria for Coronary Revascularization Focused Update: J Am Coll Cardiol. [id number]. [email or web address] Electronically Signed   By: Abigail Miyamoto M.D.   On: 12/18/2015 14:52    Assessment & Plan:   Barabra was seen today for urinary tract infection.  Diagnoses and all orders for this visit:  Dysuria- her dipstick UA is normal, I will check a urine culture and screen for gonorrhea and chlamydia. -     POCT Urinalysis Dipstick (Automated) -     CULTURE, URINE COMPREHENSIVE; Future -     GC/Chlamydia Probe Amp; Future  Acute cystitis without hematuria- I will empirically treat for UTI with Bactrim. I await the results of the urine culture. -     POCT Urinalysis Dipstick (Automated) -     CULTURE, URINE COMPREHENSIVE; Future -     sulfamethoxazole-trimethoprim (BACTRIM DS,SEPTRA DS) 800-160 MG tablet; Take 1 tablet by mouth 2 (two) times daily.   I am having Ms. Klebba start on sulfamethoxazole-trimethoprim. I am also having her maintain her meclizine, SUMAtriptan, pantoprazole, ondansetron, metroNIDAZOLE, rOPINIRole, Bismuth  Subsalicylate, tetracycline, ALPRAZolam, promethazine, lidocaine, naproxen, and topiramate.  Meds ordered this encounter  Medications  . sulfamethoxazole-trimethoprim (BACTRIM DS,SEPTRA DS) 800-160 MG tablet    Sig: Take 1 tablet by mouth 2 (two) times daily.    Dispense:  10 tablet    Refill:  0     Follow-up: Return in about 3 weeks (around 03/01/2016).  Scarlette Calico, MD

## 2016-02-10 LAB — GC/CHLAMYDIA PROBE AMP
CT Probe RNA: NOT DETECTED
GC Probe RNA: NOT DETECTED

## 2016-02-11 ENCOUNTER — Encounter: Payer: Self-pay | Admitting: Internal Medicine

## 2016-02-11 LAB — CULTURE, URINE COMPREHENSIVE

## 2016-04-01 ENCOUNTER — Ambulatory Visit: Payer: BLUE CROSS/BLUE SHIELD | Admitting: Pulmonary Disease

## 2016-04-08 ENCOUNTER — Other Ambulatory Visit: Payer: Self-pay | Admitting: Internal Medicine

## 2016-05-11 ENCOUNTER — Other Ambulatory Visit: Payer: Self-pay | Admitting: Neurology

## 2016-05-20 ENCOUNTER — Ambulatory Visit (INDEPENDENT_AMBULATORY_CARE_PROVIDER_SITE_OTHER)
Admission: RE | Admit: 2016-05-20 | Discharge: 2016-05-20 | Disposition: A | Discharge status: Home / Self Care | Payer: BLUE CROSS/BLUE SHIELD | Source: Ambulatory Visit | Attending: Internal Medicine | Admitting: Internal Medicine

## 2016-05-20 ENCOUNTER — Ambulatory Visit (INDEPENDENT_AMBULATORY_CARE_PROVIDER_SITE_OTHER): Payer: BLUE CROSS/BLUE SHIELD | Admitting: Internal Medicine

## 2016-05-20 ENCOUNTER — Encounter: Payer: Self-pay | Admitting: Internal Medicine

## 2016-05-20 ENCOUNTER — Other Ambulatory Visit (INDEPENDENT_AMBULATORY_CARE_PROVIDER_SITE_OTHER): Payer: BLUE CROSS/BLUE SHIELD

## 2016-05-20 VITALS — BP 110/70 | HR 73 | Temp 97.8°F | Resp 14 | Ht 66.0 in | Wt 179.0 lb

## 2016-05-20 DIAGNOSIS — Z87891 Personal history of nicotine dependence: Secondary | ICD-10-CM | POA: Diagnosis not present

## 2016-05-20 DIAGNOSIS — R634 Abnormal weight loss: Secondary | ICD-10-CM

## 2016-05-20 DIAGNOSIS — Z1239 Encounter for other screening for malignant neoplasm of breast: Secondary | ICD-10-CM

## 2016-05-20 DIAGNOSIS — R0600 Dyspnea, unspecified: Secondary | ICD-10-CM

## 2016-05-20 DIAGNOSIS — R112 Nausea with vomiting, unspecified: Secondary | ICD-10-CM | POA: Diagnosis not present

## 2016-05-20 DIAGNOSIS — Z1231 Encounter for screening mammogram for malignant neoplasm of breast: Secondary | ICD-10-CM

## 2016-05-20 LAB — CBC
HCT: 41.4 % (ref 36.0–46.0)
Hemoglobin: 13.7 g/dL (ref 12.0–15.0)
MCHC: 33.2 g/dL (ref 30.0–36.0)
MCV: 95.6 fl (ref 78.0–100.0)
PLATELETS: 187 10*3/uL (ref 150.0–400.0)
RBC: 4.33 Mil/uL (ref 3.87–5.11)
RDW: 12.8 % (ref 11.5–15.5)
WBC: 5.5 10*3/uL (ref 4.0–10.5)

## 2016-05-20 LAB — COMPREHENSIVE METABOLIC PANEL
ALT: 18 U/L (ref 0–35)
AST: 15 U/L (ref 0–37)
Albumin: 4.3 g/dL (ref 3.5–5.2)
Alkaline Phosphatase: 66 U/L (ref 39–117)
BILIRUBIN TOTAL: 0.5 mg/dL (ref 0.2–1.2)
BUN: 11 mg/dL (ref 6–23)
CO2: 28 meq/L (ref 19–32)
Calcium: 9.7 mg/dL (ref 8.4–10.5)
Chloride: 110 mEq/L (ref 96–112)
Creatinine, Ser: 0.81 mg/dL (ref 0.40–1.20)
GFR: 79.04 mL/min (ref >60.00)
GLUCOSE: 88 mg/dL (ref 70–99)
Potassium: 4.2 mEq/L (ref 3.5–5.1)
SODIUM: 142 meq/L (ref 135–145)
TOTAL PROTEIN: 7.2 g/dL (ref 6.0–8.3)

## 2016-05-20 LAB — LIPID PANEL
CHOL/HDL RATIO: 4
Cholesterol: 202 mg/dL — ABNORMAL HIGH (ref 0–200)
HDL: 55.5 mg/dL (ref >39.00)
LDL Cholesterol: 130 mg/dL — ABNORMAL HIGH (ref 0–99)
NONHDL: 146.34
Triglycerides: 80 mg/dL (ref 0.0–149.0)
VLDL: 16 mg/dL (ref 0.0–40.0)

## 2016-05-20 LAB — HEMOGLOBIN A1C: HEMOGLOBIN A1C: 6 % (ref 4.6–6.5)

## 2016-05-20 LAB — VITAMIN B12: VITAMIN B 12: 458 pg/mL (ref 211–911)

## 2016-05-20 LAB — LIPASE: Lipase: 7 U/L — ABNORMAL LOW (ref 11.0–59.0)

## 2016-05-20 LAB — T4, FREE: FREE T4: 0.77 ng/dL (ref 0.60–1.60)

## 2016-05-20 LAB — VITAMIN D 25 HYDROXY (VIT D DEFICIENCY, FRACTURES): VITD: 21.02 ng/mL — ABNORMAL LOW (ref 30.00–100.00)

## 2016-05-20 LAB — TSH: TSH: 0.8 u[IU]/mL (ref 0.35–4.50)

## 2016-05-20 NOTE — Patient Instructions (Signed)
We are checking the labs today and the x-ray of the chest. We will send you those results (the chest x-ray today on mychart) and labs likely Monday.   We have ordered the CT scan of the stomach and chest to check for any changes. We have also ordered the mammogram.

## 2016-05-20 NOTE — Assessment & Plan Note (Signed)
Unintentional weight loss >10% in the last 2 months. She is not up to date on mammogram which is ordered today. Colonoscopy is up to date and 4 polyps removed this fall 2017. Checking labs today for thyroid, CMP, CBC, B12, vitamin D, HIV. Given smoking history will check CXR today and order CT abdomen and pelvis and chest for cause of weight loss. This is very concerning given the amount of weight in the time period.

## 2016-05-20 NOTE — Progress Notes (Signed)
Subjective:    Patient ID: [name] White, female    DOB: 11-30-1965, 51 y.o.   MRN: [id number]  HPI The patient is a 51 YO female coming in for losing weight with no reason. No change to diet. Colonoscopy in October which did not show any polyps as well as EGD without concerns with Dr. Collene Mares. She is having a lot of nausea and vomiting several times per week. Mammogram is not up to date. She is not eating as well due to the nausea. She is also having some pain in her mid back. There is a family history of kidney cancer and she is very worried about that. 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 She denies fevers or chills. Not sleeping well. She is worried about this as she has lost about 50 pounds in the last 2-3 months. She was supposed to get a CT scan of stomach and chest from GI doctor but insurance did not approve for some reason. She does have IBS and is having some change in bowel habits with intermittent diarrhea with constipation. Denies a lot of stress in her life new or different. She is having less energy and some SOB with exertion as well in the same time period.   Review of Systems  Constitutional: Positive for activity change, appetite change, fatigue and unexpected weight change. Negative for chills and fever.  HENT: Negative.   Eyes: Negative.   Respiratory: Positive for shortness of breath. Negative for apnea, cough, chest tightness, wheezing and stridor.   Cardiovascular: Negative for chest pain, palpitations and leg swelling.  Gastrointestinal: Positive for constipation, diarrhea, nausea and vomiting. Negative for abdominal distention, abdominal pain and blood in stool.  Genitourinary: Negative.   Musculoskeletal: Positive for back pain. Negative for arthralgias, joint swelling, myalgias, neck pain and neck stiffness.  Skin: Negative.   Neurological: Positive for numbness. Negative for dizziness, tremors, syncope,  facial asymmetry and weakness.       Stable from prior  Psychiatric/Behavioral: Negative.        Worry about weight loss      Objective:   Physical Exam  Constitutional: She is oriented to person, place, and time. She appears well-developed and well-nourished.  Appears to have lost weight recently with some temporal wasting  HENT:  Head: Normocephalic and atraumatic.  Right Ear: External ear normal.  Left Ear: External ear normal.  Mouth/Throat: Oropharynx is clear and moist.  Eyes: EOM are normal.  Neck: Normal range of motion. No JVD present. No thyromegaly present.  Cardiovascular: Normal rate and regular rhythm.   Pulmonary/Chest: Effort normal and breath sounds normal. No respiratory distress. She has no wheezes. She has no rales.  Abdominal: Soft. Bowel sounds are normal. She exhibits no distension and no mass. There is tenderness. There is no rebound and no guarding.  Flank pain left side, some mild tenderness in the mid abdomen.   Musculoskeletal: She exhibits no edema.  Lymphadenopathy:    She has no cervical adenopathy.  Neurological: She is alert and oriented to person, place, and time. Coordination normal.  Skin: Skin is warm and dry.  Psychiatric:  Appropriate concern over recent massive unintentional weight loss.    Vitals:   05/20/16 1317  BP: 110/70  Pulse: 73  Resp: 14  Temp: 97.8 F (36.6 C)  TempSrc: Oral  SpO2: 99%  Weight: 179 lb (81.2 kg)  Height: 5\' 6"  (1.676 m)      Assessment & Plan:

## 2016-05-20 NOTE — Progress Notes (Signed)
Pre visit review using our clinic review tool, if applicable. No additional management support is needed unless otherwise documented below in the visit note. 

## 2016-05-21 LAB — HIV ANTIBODY (ROUTINE TESTING W REFLEX): HIV 1&2 Ab, 4th Generation: NONREACTIVE

## 2016-05-26 ENCOUNTER — Other Ambulatory Visit: Payer: Self-pay | Admitting: Internal Medicine

## 2016-05-26 DIAGNOSIS — Z1231 Encounter for screening mammogram for malignant neoplasm of breast: Secondary | ICD-10-CM

## 2016-05-27 ENCOUNTER — Inpatient Hospital Stay: Admission: RE | Admit: 2016-05-27 | Payer: BLUE CROSS/BLUE SHIELD | Source: Ambulatory Visit

## 2016-05-27 ENCOUNTER — Ambulatory Visit
Admission: RE | Admit: 2016-05-27 | Discharge: 2016-05-27 | Disposition: A | Discharge status: Home / Self Care | Payer: BLUE CROSS/BLUE SHIELD | Source: Ambulatory Visit | Attending: Internal Medicine | Admitting: Internal Medicine

## 2016-05-27 DIAGNOSIS — Z87891 Personal history of nicotine dependence: Secondary | ICD-10-CM

## 2016-05-27 DIAGNOSIS — R0600 Dyspnea, unspecified: Secondary | ICD-10-CM

## 2016-05-27 DIAGNOSIS — R112 Nausea with vomiting, unspecified: Secondary | ICD-10-CM

## 2016-05-27 DIAGNOSIS — R634 Abnormal weight loss: Secondary | ICD-10-CM

## 2016-05-27 MED ORDER — IOPAMIDOL (ISOVUE-300) INJECTION 61%
100.0000 mL | Freq: Once | INTRAVENOUS | Status: AC | PRN
  Administered 2016-05-27: 100 mL via INTRAVENOUS

## 2016-05-28 ENCOUNTER — Encounter: Payer: Self-pay | Admitting: Internal Medicine

## 2016-06-18 ENCOUNTER — Ambulatory Visit
Admission: RE | Admit: 2016-06-18 | Discharge: 2016-06-18 | Disposition: A | Discharge status: Home / Self Care | Payer: BLUE CROSS/BLUE SHIELD | Source: Ambulatory Visit | Attending: Internal Medicine | Admitting: Internal Medicine

## 2016-06-18 DIAGNOSIS — Z1231 Encounter for screening mammogram for malignant neoplasm of breast: Secondary | ICD-10-CM

## 2016-06-29 ENCOUNTER — Encounter: Payer: Self-pay | Admitting: Neurology

## 2016-06-30 ENCOUNTER — Encounter: Payer: Self-pay | Admitting: Internal Medicine

## 2016-06-30 DIAGNOSIS — R112 Nausea with vomiting, unspecified: Secondary | ICD-10-CM

## 2016-06-30 DIAGNOSIS — R634 Abnormal weight loss: Secondary | ICD-10-CM

## 2016-07-05 ENCOUNTER — Other Ambulatory Visit: Payer: Self-pay | Admitting: Internal Medicine

## 2016-07-12 ENCOUNTER — Other Ambulatory Visit: Payer: Self-pay | Admitting: Internal Medicine

## 2016-07-14 ENCOUNTER — Ambulatory Visit (INDEPENDENT_AMBULATORY_CARE_PROVIDER_SITE_OTHER): Payer: BLUE CROSS/BLUE SHIELD | Admitting: Gastroenterology

## 2016-07-14 ENCOUNTER — Encounter: Payer: Self-pay | Admitting: Gastroenterology

## 2016-07-14 VITALS — BP 94/58 | HR 62 | Ht 65.0 in | Wt 172.5 lb

## 2016-07-14 DIAGNOSIS — R109 Unspecified abdominal pain: Secondary | ICD-10-CM

## 2016-07-14 DIAGNOSIS — R112 Nausea with vomiting, unspecified: Secondary | ICD-10-CM

## 2016-07-14 DIAGNOSIS — R634 Abnormal weight loss: Secondary | ICD-10-CM

## 2016-07-14 DIAGNOSIS — R630 Anorexia: Secondary | ICD-10-CM

## 2016-07-14 DIAGNOSIS — K219 Gastro-esophageal reflux disease without esophagitis: Secondary | ICD-10-CM | POA: Diagnosis not present

## 2016-07-14 DIAGNOSIS — D497 Neoplasm of unspecified behavior of endocrine glands and other parts of nervous system: Secondary | ICD-10-CM | POA: Diagnosis not present

## 2016-07-14 MED ORDER — RANITIDINE HCL 150 MG PO TABS: 150.0000 mg | ORAL_TABLET | Freq: Every day | ORAL | 3 refills | Status: DC

## 2016-07-14 NOTE — Patient Instructions (Addendum)
You have been scheduled for a gastric emptying scan at [facility] on 07/22/2016 at 7:30am. Please arrive at least 15 minutes prior to your appointment for registration. Please make certain not to have anything to eat or drink after midnight the night before your test. Hold all stomach medications (ex: Zofran, phenergan, Reglan) 48 hours prior to your test. If you need to reschedule your appointment, please contact radiology scheduling at [phone]. _____________________________________________________________________ A gastric-emptying study measures how long it takes for food to move through your stomach. There are several ways to measure stomach emptying. In the most common test, you eat food that contains a small amount of radioactive material. A scanner that detects the movement of the radioactive material is placed over your abdomen to monitor the rate at which food leaves your stomach. This test normally takes about 4 hours to complete. _____________________________________________________________________    Your MRI of the brain is scheduled on June 4th at 8 am , you will go to the [place] [facility] [place] you will need to arrive 15 minutes early  No Prep is needed  Take Protonix 30 minutes before breakfast  We will send Zantac to your pharmacy   Use IBGard 1 capsule three times a day as needed

## 2016-07-14 NOTE — Progress Notes (Signed)
[name] White    [id number]    [birth year]  Primary Care Physician:Crawford, Real Cons, MD  Referring Physician: Hoyt Koch, MD [name] White [id number]  Chief complaint:  Nausea, loss of appetite and weight loss  HPI: 50 year old female with history of chronic GERD, ? Barrett's esophagus remotely followed by Dr. Olevia White, was last seen 11/20/2010 is here to reestablish care.  Patient started having severe nausea associated with decreased appetite and weight loss in October 2017, she saw Dr. Collene White, underwent EGD and colonoscopy were unremarkable per patient. She continues to do have significant nausea with decreased appetite and has lost more than 50 pounds in the past few months. She is forcing herself to eat but she feels sick and vomits her entire meal sometime later. She was having irregular bowel habits with severe constipation alternating with diarrhea, she had episodes where did not have bowel movement for 3 weeks followed by diarrhea for a week. Her bowel habits more regular since she was started on Linzess 290 g daily by Dr. Sharlet White. She is currently having one to 2 formed bowel movements daily. 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 Olevia White in the past, most recent in 2012 with no evidence of Barrett's esophagus. Prior to that in 2011 with no Barrett's .She had short segment or biopsies from likely gastric mucosa which showed possible Barrett's in 2007 and 2009 Colonoscopy in June 2012 with removal of 2 polyps that were hyperplastic   Outpatient  Encounter Prescriptions as of 07/14/2016  Medication Sig  . ALPRAZolam (XANAX) 1 MG tablet TAKE 1/2 TO 1 (ONE-HALF TO ONE) TABLET BY MOUTH AT BEDTIME AS NEEDED  . lidocaine (XYLOCAINE) 2 % solution Use as directed 20 mLs in the mouth or throat as needed for mouth pain.  Marland Kitchen LINZESS 290 MCG CAPS capsule   . meclizine (ANTIVERT) 25 MG tablet TAKE ONE TABLET BY MOUTH THREE TIMES DAILY AS NEEDED FOR DIZZINESS  . naproxen (NAPROSYN) 500 MG tablet Take 1 tablet (500 mg total) by mouth every 12 (twelve) hours as needed.  . ondansetron (ZOFRAN) 4 MG tablet TAKE ONE TABLET BY MOUTH EVERY 8 HOURS AS NEEDED FOR NAUSEA OR VOMITING  . pantoprazole (PROTONIX) 40 MG tablet Take 1 tablet (40 mg total) by mouth daily.  Marland Kitchen rOPINIRole (REQUIP) 1 MG tablet TAKE ONE TO TWO TABLETS BY MOUTH AT BEDTIME  . SUMAtriptan (IMITREX) 100 MG tablet Take 1 at earliest onset of headache.  May repeat once in 2 hours if headache persists or recurs.  Do not exceed 2 tablets in 24 hours  . topiramate (TOPAMAX) 50 MG tablet TAKE THREE TABLETS BY MOUTH AT BEDTIME  . [DISCONTINUED] promethazine (PHENERGAN) 25 MG tablet TAKE ONE TABLET BY MOUTH EVERY 8 HOURS AS NEEDED FOR NAUSEA OR  VOMITING   No facility-administered encounter medications on file as of 07/14/2016.     Allergies as of 07/14/2016 - Review Complete 07/14/2016  Allergen Reaction Noted  . Morphine and related Hives 08/06/2010  . Penicillins  10/04/2008    Past Medical  History:  Diagnosis Date  . Barrett's esophagus    egd 2007 + 2009 but none 2011 or 08/2010 egd  . Chronic headache   . Colon polyp    hyperplastic  . Depression   . Grief reaction   . Hidradenitis suppurativa   . IBS (irritable bowel syndrome)   . Migraine   . OSA (obstructive sleep apnea)   . Ovarian cyst   . Restless leg   . Tobacco abuse     Past Surgical History:  Procedure Laterality Date  . ABDOMINAL HYSTERECTOMY     secondary to endometriosis  . CESAREAN SECTION     x 2  .  CHOLECYSTECTOMY    . COLONOSCOPY W/ POLYPECTOMY  2012  . ELBOW SURGERY     left  . LAPAROSCOPY  04-22-10   lysis of adhesions  . LAPAROSCOPY  04-30-10    with mini laparotomy and cystotomy repair  . plantar fascitis     right foot  . SALPINGECTOMY  04-22-10   right  . TUBAL LIGATION    . WRIST SURGERY     right    Family History  Problem Relation Age of Onset  . COPD Mother   . Stroke Mother   . Coronary artery disease Mother        pacer  . Colon polyps Mother   . Breast cancer Mother   . Migraines Mother   . Hypertension Father   . Colon polyps Brother   . Colon cancer Maternal Grandfather   . Colon polyps Maternal Grandfather   . Breast cancer Maternal Aunt   . Uterine cancer Cousin        x 2  . Aneurysm Maternal Grandmother   . Stomach cancer Neg Hx     Social History   Social History  . Marital status: Divorced    Spouse name: N/A  . Number of children: 2  . Years of education: N/A   Occupational History  . unemployed [employer] History Main Topics  . Smoking status: Former Smoker    Packs/day: 0.50    Years: 30.00    Types: Cigarettes    Quit date: 06/21/2015  . Smokeless tobacco: Never Used  . Alcohol use 0.0 oz/week     Comment: 2 x a month  . Drug use: No  . Sexual activity: Not on file   Other Topics Concern  . 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  positive for lack of energy, loss of appetite and weight loss HENT: Positive for sinus problems, postnasal drip and runny nose   Eyes: Negative for blurred vision.  Respiratory: Negative for cough, shortness of breath and wheezing.   Cardiovascular: Negative for chest pain and palpitations.  Gastrointestinal: as per HPI Genitourinary:  Negative for dysuria, urgency, frequency and hematuria.  Musculoskeletal: Positive for myalgias, back pain and joint pain.  Skin: Negative for itching and rash.  Neurological: Negative for  tremors, focal weakness, seizures and loss of consciousness.  positive for dizziness and leg cramps Endo/Heme/Allergies: Positive for seasonal allergies.  positive for decreased sex drive Psychiatric/Behavioral: Negative for depression, suicidal ideas and hallucinations.  positive for anxiety and  insomnia All other systems reviewed and are negative.   Physical Exam: Vitals:   07/14/16 1000  BP: (!) 94/58  Pulse: 62   Body mass index is 28.71 kg/m. Gen:      No acute distress HEENT:  EOMI, sclera anicteric Neck:     No masses; no thyromegaly Lungs:    Clear to auscultation bilaterally; normal respiratory effort CV:         Regular rate and rhythm; no murmurs Abd:      + bowel sounds; soft, non-tender; no palpable masses, no distension Ext:    No edema; adequate peripheral perfusion Skin:      Warm and dry; no rash Neuro: alert and oriented x 3 Psych: normal mood and affect  Data Reviewed:  Reviewed labs, radiology imaging, old records and pertinent past GI work up   Assessment and Plan/Recommendations:  51 year old female with history of chronic GERD here with complaints of severe nausea, loss of appetite and significant weight loss of unclear etiology She has had extensive workup as outlined in history of present illness  She had EGD and colonoscopy about 6 months ago by Dr. Collene White that was unremarkable per patient , report is not available during this visit to review. We'll obtain records from Dr. Collene White  Obtain gastric emptying scan to exclude gastroparesis  Patient has history of pineal cyst, obtain MRI brain with contrast  Chronic GERD: Continue Protonix 30 minutes before breakfast and add Zantac 150 milligrams at bedtime  Advised patient to eat small frequent meals Avoid high-fiber  and high fat diet  IB Gard one capsule 3 times daily as needed for abdominal cramps  Return in 1 month or sooner if needed  Damaris Hippo , MD [phone] Mon-Fri 8a-5p [phone] after 5p, weekends, holidays  CC: [name] White, *

## 2016-07-21 ENCOUNTER — Encounter: Payer: Self-pay | Admitting: Physician Assistant

## 2016-07-21 ENCOUNTER — Ambulatory Visit (INDEPENDENT_AMBULATORY_CARE_PROVIDER_SITE_OTHER): Payer: BLUE CROSS/BLUE SHIELD | Admitting: Physician Assistant

## 2016-07-21 VITALS — BP 108/70 | HR 71 | Temp 98.6°F | Ht 65.0 in | Wt 169.0 lb

## 2016-07-21 DIAGNOSIS — J069 Acute upper respiratory infection, unspecified: Secondary | ICD-10-CM | POA: Diagnosis not present

## 2016-07-21 MED ORDER — AZITHROMYCIN 250 MG PO TABS: ORAL_TABLET | ORAL | 0 refills | Status: DC

## 2016-07-21 MED ORDER — BENZONATATE 200 MG PO CAPS: 200.0000 mg | ORAL_CAPSULE | Freq: Two times a day (BID) | ORAL | 0 refills | Status: DC | PRN

## 2016-07-21 MED ORDER — HYDROCOD POLST-CPM POLST ER 10-8 MG/5ML PO SUER: 5.0000 mL | Freq: Every evening | ORAL | 0 refills | Status: DC | PRN

## 2016-07-21 NOTE — Patient Instructions (Signed)
It was great meeting you today!  Please take a daily antihistamine such as Zyrtec, Claritin or Allegra daily. Use the cough syrup at night or at home when not expecting to drive anywhere, as it may make you sleepy. You may also use the tessalon perles capsules during the day.  If you do not improve despite treatment or symptoms worsen, begin the z-pack. I would like for you to be re-evaluated if you develop worsening shortness of breath or fevers.

## 2016-07-21 NOTE — Progress Notes (Signed)
[name] White is a 51 y.o. female here for   I acted as a Education administrator for [employer], PA-C Anselmo Pickler, LPN  History of Present Illness:   Chief Complaint  Patient presents with  . Cough  . Chest congestion    Cough  This is a new problem. Episode onset: X 2.5 days ago. The problem has been gradually worsening. The problem occurs every few minutes (Chest congestion). The cough is non-productive. Associated symptoms include headaches, nasal congestion, postnasal drip, a sore throat, sweats, weight loss and wheezing. Pertinent negatives include no chest pain, chills, ear congestion, ear pain, fever, heartburn, hemoptysis, myalgias, rash, rhinorrhea or shortness of breath. Associated symptoms comments: Feels hot. Exacerbated by: worse when lying down. Treatments tried: Mucinex and Mucinex Fast Max cough suppressant. The treatment provided no relief.   No sick contacts that she is aware of, but does work at a bar. Does have seasonal allergies. No history of asthma. Has had PNA, 2 years ago was not hospitalized. No shortness of breath. Has been trying Mucinex. Appetite is poor at baseline. Drinks a lot water, feels well hydrated. No longer a smoker -- quit last year.  PMHx, SurgHx, SocialHx, Medications, and Allergies were reviewed in the Visit Navigator and updated as appropriate.  Current Medications:   Current Outpatient Prescriptions:  .  ALPRAZolam (XANAX) 1 MG tablet, TAKE 1/2 TO 1 (ONE-HALF TO ONE) TABLET BY MOUTH AT BEDTIME AS NEEDED, Disp: 30 tablet, Rfl: 2 .  lidocaine (XYLOCAINE) 2 % solution, Use as directed 20 mLs in the mouth or throat as needed for mouth pain., Disp: 200 mL, Rfl: 2 .  LINZESS 290 MCG CAPS capsule, , Disp: , Rfl: 0 .  meclizine (ANTIVERT) 25 MG tablet, TAKE ONE TABLET BY MOUTH THREE TIMES DAILY AS NEEDED FOR DIZZINESS, Disp: 60 tablet, Rfl: 0 .  naproxen (NAPROSYN) 500 MG tablet, Take 1 tablet (500 mg total) by mouth every 12 (twelve) hours as needed., Disp:  16 tablet, Rfl: 5 .  ondansetron (ZOFRAN) 4 MG tablet, TAKE ONE TABLET BY MOUTH EVERY 8 HOURS AS NEEDED FOR NAUSEA OR VOMITING, Disp: 30 tablet, Rfl: 1 .  pantoprazole (PROTONIX) 40 MG tablet, Take 1 tablet (40 mg total) by mouth daily., Disp: 90 tablet, Rfl: 3 .  rOPINIRole (REQUIP) 1 MG tablet, TAKE ONE TO TWO TABLETS BY MOUTH AT BEDTIME, Disp: 60 tablet, Rfl: 3 .  SUMAtriptan (IMITREX) 100 MG tablet, Take 1 at earliest onset of headache.  May repeat once in 2 hours if headache persists or recurs.  Do not exceed 2 tablets in 24 hours, Disp: 10 tablet, Rfl: 2 .  topiramate (TOPAMAX) 50 MG tablet, TAKE THREE TABLETS BY MOUTH AT BEDTIME, Disp: 270 tablet, Rfl: 1 .  azithromycin (ZITHROMAX) 250 MG tablet, Take two tablets on day one, and then 1 tablet daily for 4 days, Disp: 6 tablet, Rfl: 0 .  benzonatate (TESSALON) 200 MG capsule, Take 1 capsule (200 mg total) by mouth 2 (two) times daily as needed for cough., Disp: 20 capsule, Rfl: 0 .  chlorpheniramine-HYDROcodone (TUSSIONEX PENNKINETIC ER) 10-8 MG/5ML SUER, Take 5 mLs by mouth at bedtime as needed for cough., Disp: 60 mL, Rfl: 0 .  ranitidine (ZANTAC) 150 MG tablet, Take 1 tablet (150 mg total) by mouth at bedtime. (Patient not taking: Reported on 07/21/2016), Disp: 30 tablet, Rfl: 3   Review of Systems:   Review of Systems  Constitutional: Positive for weight loss. Negative for chills and fever.  HENT:  Positive for postnasal drip and sore throat. Negative for ear pain and rhinorrhea.   Respiratory: Positive for cough and wheezing. Negative for hemoptysis and shortness of breath.   Cardiovascular: Negative for chest pain.  Gastrointestinal: Negative for heartburn.  Musculoskeletal: Negative for myalgias.  Skin: Negative for rash.  Neurological: Positive for headaches.    Vitals:   Vitals:   07/21/16 1043  BP: 108/70  Pulse: 71  Temp: 98.6 F (37 C)  TempSrc: Oral  SpO2: 98%  Weight: 169 lb (76.7 kg)  Height: 5\' 5"  (1.651 m)      Body mass index is 28.12 kg/m.  Physical Exam:   Physical Exam  Constitutional: She appears well-developed. She is cooperative.  Non-toxic appearance. She does not have a sickly appearance. She does not appear ill. No distress.  HENT:  Head: Normocephalic and atraumatic.  Right Ear: Tympanic membrane, external ear and ear canal normal. Tympanic membrane is not erythematous, not retracted and not bulging.  Left Ear: Tympanic membrane, external ear and ear canal normal. Tympanic membrane is not erythematous, not retracted and not bulging.  Nose: Mucosal edema and rhinorrhea present. Right sinus exhibits no maxillary sinus tenderness and no frontal sinus tenderness. Left sinus exhibits no maxillary sinus tenderness and no frontal sinus tenderness.  Mouth/Throat: Uvula is midline. Posterior oropharyngeal erythema present. No posterior oropharyngeal edema. Tonsils are 0 on the right. Tonsils are 0 on the left. No tonsillar exudate.  Eyes: Conjunctivae and lids are normal.  Neck: Trachea normal.  Cardiovascular: Normal rate, regular rhythm, S1 normal, S2 normal and normal heart sounds.   Pulmonary/Chest: Effort normal and breath sounds normal. She has no decreased breath sounds. She has no wheezes. She has no rhonchi. She has no rales.  Dry, hacking cough throughout encounter  Lymphadenopathy:    She has no cervical adenopathy.  Neurological: She is alert.  Skin: Skin is warm, dry and intact.  Psychiatric: She has a normal mood and affect. Her speech is normal and behavior is normal.  Nursing note and vitals reviewed.     Assessment and Plan:    [name] White was seen today for cough and chest congestion.  Diagnoses and all orders for this visit:  Upper respiratory tract infection, unspecified type Vitals presently stable. I discussed with her that this is likely viral. I advised her to resume her antihistamine, and start tessalon perles during the day for her cough and Tussionex at night  (or when at home and not driving) for her night-time cough. She reports that she has had codeine-containing cough syrups in the past and has tolerated well. I recommended that she follow-up with [place] if she develops fever, worsening symptoms or develops chest pain. I also provided patient with safety net prescription of a Z-pack to take after 3-4 days of symptomatic care if no improvement of symptoms.  Other orders -     azithromycin (ZITHROMAX) 250 MG tablet; Take two tablets on day one, and then 1 tablet daily for 4 days -     benzonatate (TESSALON) 200 MG capsule; Take 1 capsule (200 mg total) by mouth 2 (two) times daily as needed for cough. -     chlorpheniramine-HYDROcodone (TUSSIONEX PENNKINETIC ER) 10-8 MG/5ML SUER; Take 5 mLs by mouth at bedtime as needed for cough.    . Reviewed expectations re: course of current medical issues. . Discussed self-management of symptoms. . Outlined signs and symptoms indicating need for more acute intervention. . Patient verbalized understanding and all questions were answered. Marland Kitchen  See orders for this visit as documented in the electronic medical record. . Patient received an After-Visit Summary.  CMA or LPN served as scribe during this visit. History, Physical, and Plan performed by medical provider. Documentation and orders reviewed and attested to.  Inda Coke, PA-C

## 2016-07-22 ENCOUNTER — Ambulatory Visit (HOSPITAL_COMMUNITY)
Admission: RE | Admit: 2016-07-22 | Discharge: 2016-07-22 | Disposition: A | Discharge status: Home / Self Care | Payer: BLUE CROSS/BLUE SHIELD | Source: Ambulatory Visit | Attending: Gastroenterology | Admitting: Gastroenterology

## 2016-07-22 DIAGNOSIS — R634 Abnormal weight loss: Secondary | ICD-10-CM | POA: Diagnosis not present

## 2016-07-22 MED ORDER — TECHNETIUM TC 99M SULFUR COLLOID
2.0600 | Freq: Once | INTRAVENOUS | Status: AC | PRN
  Administered 2016-07-22: 2.06 via INTRAVENOUS

## 2016-07-23 ENCOUNTER — Telehealth: Payer: Self-pay | Admitting: Gastroenterology

## 2016-07-23 ENCOUNTER — Other Ambulatory Visit: Payer: Self-pay

## 2016-07-23 DIAGNOSIS — R634 Abnormal weight loss: Secondary | ICD-10-CM

## 2016-07-26 ENCOUNTER — Ambulatory Visit (HOSPITAL_COMMUNITY): Admission: RE | Admit: 2016-07-26 | Payer: BLUE CROSS/BLUE SHIELD | Source: Ambulatory Visit

## 2016-07-26 ENCOUNTER — Ambulatory Visit (HOSPITAL_COMMUNITY)
Admission: RE | Admit: 2016-07-26 | Discharge: 2016-07-26 | Disposition: A | Discharge status: Home / Self Care | Payer: BLUE CROSS/BLUE SHIELD | Source: Ambulatory Visit | Attending: Gastroenterology | Admitting: Gastroenterology

## 2016-07-26 DIAGNOSIS — G930 Cerebral cysts: Secondary | ICD-10-CM | POA: Insufficient documentation

## 2016-07-26 DIAGNOSIS — R634 Abnormal weight loss: Secondary | ICD-10-CM | POA: Diagnosis not present

## 2016-07-26 MED ORDER — GADOBENATE DIMEGLUMINE 529 MG/ML IV SOLN
15.0000 mL | Freq: Once | INTRAVENOUS | Status: AC | PRN
  Administered 2016-07-26: 15 mL via INTRAVENOUS

## 2016-07-27 ENCOUNTER — Ambulatory Visit (INDEPENDENT_AMBULATORY_CARE_PROVIDER_SITE_OTHER): Payer: BLUE CROSS/BLUE SHIELD | Admitting: Neurology

## 2016-07-27 ENCOUNTER — Encounter: Payer: Self-pay | Admitting: Neurology

## 2016-07-27 VITALS — BP 112/72 | HR 63 | Ht 65.0 in | Wt 166.0 lb

## 2016-07-27 DIAGNOSIS — G43009 Migraine without aura, not intractable, without status migrainosus: Secondary | ICD-10-CM | POA: Diagnosis not present

## 2016-07-27 NOTE — Patient Instructions (Signed)
No change in management. Continue topiramate Continue sumatriptan with naproxen as needed Follow up in 8 months.

## 2016-07-27 NOTE — Progress Notes (Signed)
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   UPDATE: Headaches are well-controlled Intensity:  4/10 Duration:  30 minutes Frequency:  One every 2 to 3 months Current NSAIDS:  naproxen 500mg  (with sumatriptan) Current analgesics:  tramadol 100mg  (for nerve/hip pain) Current triptans:  sumatriptan 100mg  (with naproxen 500mg ) Current anti-emetic:  phenergan Current muscle relaxants:  no Current anti-anxiolytic:  alprazolam Current sleep aide:  alprazolam Current Antihypertensive medications:  no Current Antidepressant medications:  no Current Anticonvulsant medications:  topiramate 150mg  Current Vitamins/Herbal/Supplements:  no Current Antihistamines/Decongestants:  no Other therapy:  no Other medication:  meclizine    To assess chronic nausea, her gastroenterologist ordered an MRI of the brain with and without contrast performed yesterday, which was personally reviewed and demonstrated stable non-enhancing pineal gland cyst, but otherwise unremarkable except for some paranasal sinus disease.  HISTORY: MIGRAINE: Onset:  Around early 51s Location:  Top of head, band-like distribution Quality:  squeezing Initial Intensity:  7/10 (sometimes 10/10); December: 4/10 Aura:  no Prodrome:  no Associated symptoms:  Nausea, photophobia, phonophobia,  Initial Duration:  4 hours or sometimes up to a day; December: 30 minutes Initial Frequency:  3 to 4 times a month; December: 3 days per month Triggers/exacerbating factors:  no Relieving factors:  Sleep in dark Activity:  Needs to lay down to sleep   Past NSAIDS:  ibuprofen Past analgesics:  Tylenol, Excedrin Past abortive triptans:  sumatriptan 25mg  (does not recall ever taking) Past muscle relaxants:no   Past anti-emetic:  Zofran 8mg , Phenergan Past sleep aide:  melatonin, Ambien Past  antihypertensive medications:  no Past antidepressant medications:  venlafaxine XR 150mg  (for depression) Past anticonvulsant medications:  gabapentin 1200mg  three times daily, Lyrica 100mg  three times daily (both for neuropathic nerve pain from hip surgery)     Caffeine:  no Alcohol:  no Smoker:  no Diet:  Increased water, decreased chocolate Exercise:  no Depression/stress:  Yes (family related, home-related) Sleep hygiene:  poor Family history of headache:  Mother and grandmother had migraines.  Grandmother had aneurysm.   In 2017, she was reporting episodes of dizziness that occur suddenly, usually while walking.  It is not positional.  It is not spinning but rather a wave-like movement.  There is no associated nausea, double vision, focal numbness.  It lasts for a few minutes and resolves.  It occurs twice a day.  She takes meclizine, which is not too effective. CTA of head was unremarkable, revealing no vertebrobasilar insufficiency.  Etiology did not seem neurologic.  MRI of brain without contrast from 06/05/15 and showed complex enlarged pineal gland over 1.9 cm with mild flattening of the superior colliculus.  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    PAST MEDICAL HISTORY: Past Medical History:  Diagnosis Date  . Barrett's esophagus    egd 2007 + 2009 but none 2011 or 08/2010 egd  . Chronic headache   . Colon polyp    hyperplastic  . Depression   . Grief reaction   . Hidradenitis suppurativa   . IBS (irritable bowel syndrome)   . Migraine   . OSA (obstructive sleep apnea)   . Ovarian cyst   . Restless leg   . Tobacco abuse  MEDICATIONS: Current Outpatient Prescriptions on File Prior to Visit  Medication Sig Dispense Refill  . ALPRAZolam (XANAX) 1 MG tablet TAKE 1/2 TO 1 (ONE-HALF TO ONE) TABLET BY MOUTH AT BEDTIME AS NEEDED 30 tablet 2  . azithromycin  (ZITHROMAX) 250 MG tablet Take two tablets on day one, and then 1 tablet daily for 4 days 6 tablet 0  . benzonatate (TESSALON) 200 MG capsule Take 1 capsule (200 mg total) by mouth 2 (two) times daily as needed for cough. 20 capsule 0  . chlorpheniramine-HYDROcodone (TUSSIONEX PENNKINETIC ER) 10-8 MG/5ML SUER Take 5 mLs by mouth at bedtime as needed for cough. 60 mL 0  . lidocaine (XYLOCAINE) 2 % solution Use as directed 20 mLs in the mouth or throat as needed for mouth pain. 200 mL 2  . LINZESS 290 MCG CAPS capsule   0  . meclizine (ANTIVERT) 25 MG tablet TAKE ONE TABLET BY MOUTH THREE TIMES DAILY AS NEEDED FOR DIZZINESS 60 tablet 0  . naproxen (NAPROSYN) 500 MG tablet Take 1 tablet (500 mg total) by mouth every 12 (twelve) hours as needed. 16 tablet 5  . ondansetron (ZOFRAN) 4 MG tablet TAKE ONE TABLET BY MOUTH EVERY 8 HOURS AS NEEDED FOR NAUSEA OR VOMITING 30 tablet 1  . pantoprazole (PROTONIX) 40 MG tablet Take 1 tablet (40 mg total) by mouth daily. 90 tablet 3  . ranitidine (ZANTAC) 150 MG tablet Take 1 tablet (150 mg total) by mouth at bedtime. 30 tablet 3  . rOPINIRole (REQUIP) 1 MG tablet TAKE ONE TO TWO TABLETS BY MOUTH AT BEDTIME 60 tablet 3  . SUMAtriptan (IMITREX) 100 MG tablet Take 1 at earliest onset of headache.  May repeat once in 2 hours if headache persists or recurs.  Do not exceed 2 tablets in 24 hours 10 tablet 2  . topiramate (TOPAMAX) 50 MG tablet TAKE THREE TABLETS BY MOUTH AT BEDTIME 270 tablet 1   No current facility-administered medications on file prior to visit.     ALLERGIES: Allergies  Allergen Reactions  . Morphine And Related Hives  . Penicillins     REACTION: hives    FAMILY HISTORY: Family History  Problem Relation Age of Onset  . COPD Mother   . Stroke Mother   . Coronary artery disease Mother        pacer  . Colon polyps Mother   . Breast cancer Mother   . Migraines Mother   . Hypertension Father   . Colon polyps Brother   . Colon cancer  Maternal Grandfather   . Colon polyps Maternal Grandfather   . Breast cancer Maternal Aunt   . Uterine cancer Cousin        x 2  . Aneurysm Maternal Grandmother   . Stomach cancer Neg Hx     SOCIAL HISTORY: Social History   Social History  . Marital status: Divorced    Spouse name: N/A  . Number of children: 2  . Years of education: N/A   Occupational History  . unemployed [place] History Main Topics  . Smoking status: Former Smoker    Packs/day: 0.50    Years: 30.00    Types: Cigarettes    Quit date: 06/21/2015  . Smokeless tobacco: Never Used  . Alcohol use 0.0 oz/week     Comment: 2 x a month  . Drug use: No  . Sexual activity: Not on file   Other Topics Concern  . 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   Divorced    Alcohol use-yes -x 5 per weekend   Current Smoker < 1 ppd       REVIEW OF SYSTEMS: Constitutional: No fevers, chills, or sweats, no generalized fatigue, change in appetite Eyes: No visual changes, double vision, eye pain Ear, nose and throat: No hearing loss, ear pain, nasal congestion, sore throat Cardiovascular: No chest pain, palpitations Respiratory:  No shortness of breath at rest or with exertion, wheezes GastrointestinaI: nausea, diarrhea, constipation Genitourinary:  No dysuria, urinary retention or frequency Musculoskeletal:  No neck pain, back pain Integumentary: No rash, pruritus, skin lesions Neurological: as above Psychiatric: No depression, insomnia, anxiety Endocrine: No palpitations, fatigue, diaphoresis, mood swings, change in appetite, change in weight, increased thirst Hematologic/Lymphatic:  No purpura, petechiae. Allergic/Immunologic: no itchy/runny eyes, nasal congestion, recent allergic reactions, rashes  PHYSICAL EXAM: Vitals:   07/27/16 1409  BP: 112/72  Pulse: 63   General: No acute distress.  Patient appears well-groomed.   normal body habitus. Head:  Normocephalic/atraumatic Eyes:  Fundi examined but not visualized Neck: supple, no paraspinal tenderness, full range of motion Heart:  Regular rate and rhythm Lungs:  Clear to auscultation bilaterally Back: No paraspinal tenderness Neurological Exam: alert and oriented to person, place, and time. Attention span and concentration intact, recent and remote memory intact, fund of knowledge intact.  Speech fluent and not dysarthric, language intact.  CN II-XII intact. Bulk and tone normal, muscle strength 5/5 throughout.  Sensation to light touch, temperature and vibration intact.  Deep tendon reflexes 2+ throughout, toes downgoing.  Finger to nose and heel to shin testing intact.  Gait normal, Romberg negative.  IMPRESSION: Migraine, stable  PLAN: Topiramate 150mg  daily for prevention Sumatriptan with naproxen for abortive therapy Follow up in 8 months.  Metta Clines, DO  CC:  Pricilla Holm, MD

## 2016-07-28 ENCOUNTER — Encounter: Payer: Self-pay | Admitting: Physician Assistant

## 2016-07-28 ENCOUNTER — Encounter: Payer: Self-pay | Admitting: Gastroenterology

## 2016-07-28 ENCOUNTER — Encounter: Payer: Self-pay | Admitting: Internal Medicine

## 2016-07-29 ENCOUNTER — Telehealth: Payer: Self-pay | Admitting: Internal Medicine

## 2016-07-29 ENCOUNTER — Encounter: Payer: Self-pay | Admitting: Physician Assistant

## 2016-07-29 ENCOUNTER — Telehealth: Payer: Self-pay | Admitting: Physician Assistant

## 2016-07-29 ENCOUNTER — Ambulatory Visit (INDEPENDENT_AMBULATORY_CARE_PROVIDER_SITE_OTHER): Payer: BLUE CROSS/BLUE SHIELD | Admitting: Physician Assistant

## 2016-07-29 VITALS — BP 110/68 | HR 78 | Temp 98.3°F | Ht 65.0 in | Wt 166.0 lb

## 2016-07-29 DIAGNOSIS — J019 Acute sinusitis, unspecified: Secondary | ICD-10-CM | POA: Diagnosis not present

## 2016-07-29 MED ORDER — LEVOFLOXACIN 500 MG PO TABS: 500.0000 mg | ORAL_TABLET | Freq: Every day | ORAL | 0 refills | Status: DC

## 2016-07-29 MED ORDER — PREDNISONE 20 MG PO TABS: 40.0000 mg | ORAL_TABLET | Freq: Every day | ORAL | 0 refills | Status: DC

## 2016-07-29 NOTE — Telephone Encounter (Signed)
Patient is scheduled today at 2pm with [facility] due to z pack still has not cleared up sinuses since last visit. Patient also Nurse, mental health.

## 2016-07-29 NOTE — Telephone Encounter (Signed)
Noted  

## 2016-07-29 NOTE — Patient Instructions (Signed)
Start Levaquin daily, take with food to prevent stomach upset.  Take prednisone twice daily, take with food.  Follow-up if no improvement or other concerns.  Push fluids and get plenty of rest.   Sinusitis, Adult Sinusitis is soreness and inflammation of your sinuses. Sinuses are hollow spaces in the bones around your face. Your sinuses are located:  Around your eyes.  In the middle of your forehead.  Behind your nose.  In your cheekbones.  Your sinuses and nasal passages are lined with a stringy fluid (mucus). Mucus normally drains out of your sinuses. When your nasal tissues become inflamed or swollen, the mucus can become trapped or blocked so air cannot flow through your sinuses. This allows bacteria, viruses, and funguses to grow, which leads to infection. Sinusitis can develop quickly and last for 7?10 days (acute) or for more than 12 weeks (chronic). Sinusitis often develops after a cold. What are the causes? This condition is caused by anything that creates swelling in the sinuses or stops mucus from draining, including:  Allergies.  Asthma.  Bacterial or viral infection.  Abnormally shaped bones between the nasal passages.  Nasal growths that contain mucus (nasal polyps).  Narrow sinus openings.  Pollutants, such as chemicals or irritants in the air.  A foreign object stuck in the nose.  A fungal infection. This is rare.  What increases the risk? The following factors may make you more likely to develop this condition:  Having allergies or asthma.  Having had a recent cold or respiratory tract infection.  Having structural deformities or blockages in your nose or sinuses.  Having a weak immune system.  Doing a lot of swimming or diving.  Overusing nasal sprays.  Smoking.  What are the signs or symptoms? The main symptoms of this condition are pain and a feeling of pressure around the affected sinuses. Other symptoms include:  Upper  toothache.  Earache.  Headache.  Bad breath.  Decreased sense of smell and taste.  A cough that may get worse at night.  Fatigue.  Fever.  Thick drainage from your nose. The drainage is often green and it may contain pus (purulent).  Stuffy nose or congestion.  Postnasal drip. This is when extra mucus collects in the throat or back of the nose.  Swelling and warmth over the affected sinuses.  Sore throat.  Sensitivity to light.  How is this diagnosed? This condition is diagnosed based on symptoms, a medical history, and a physical exam. To find out if your condition is acute or chronic, your health care provider may:  Look in your nose for signs of nasal polyps.  Tap over the affected sinus to check for signs of infection.  View the inside of your sinuses using an imaging device that has a light attached (endoscope).  If your health care provider suspects that you have chronic sinusitis, you may also:  Be tested for allergies.  Have a sample of mucus taken from your nose (nasal culture) and checked for bacteria.  Have a mucus sample examined to see if your sinusitis is related to an allergy.  If your sinusitis does not respond to treatment and it lasts longer than 8 weeks, you may have an MRI or CT scan to check your sinuses. These scans also help to determine how severe your infection is. In rare cases, a bone biopsy may be done to rule out more serious types of fungal sinus disease. How is this treated? Treatment for sinusitis depends on the  cause and whether your condition is chronic or acute. If a virus is causing your sinusitis, your symptoms will go away on their own within 10 days. You may be given medicines to relieve your symptoms, including:  Topical nasal decongestants. They shrink swollen nasal passages and let mucus drain from your sinuses.  Antihistamines. These drugs block inflammation that is triggered by allergies. This can help to ease swelling in  your nose and sinuses.  Topical nasal corticosteroids. These are nasal sprays that ease inflammation and swelling in your nose and sinuses.  Nasal saline washes. These rinses can help to get rid of thick mucus in your nose.  If your condition is caused by bacteria, you will be given an antibiotic medicine. If your condition is caused by a fungus, you will be given an antifungal medicine. Surgery may be needed to correct underlying conditions, such as narrow nasal passages. Surgery may also be needed to remove polyps. Follow these instructions at home: Medicines  Take, use, or apply over-the-counter and prescription medicines only as told by your health care provider. These may include nasal sprays.  If you were prescribed an antibiotic medicine, take it as told by your health care provider. Do not stop taking the antibiotic even if you start to feel better. Hydrate and Humidify  Drink enough water to keep your urine clear or pale yellow. Staying hydrated will help to thin your mucus.  Use a cool mist humidifier to keep the humidity level in your home above 50%.  Inhale steam for 10-15 minutes, 3-4 times a day or as told by your health care provider. You can do this in the bathroom while a hot shower is running.  Limit your exposure to cool or dry air. Rest  Rest as much as possible.  Sleep with your head raised (elevated).  Make sure to get enough sleep each night. General instructions  Apply a warm, moist washcloth to your face 3-4 times a day or as told by your health care provider. This will help with discomfort.  Wash your hands often with soap and water to reduce your exposure to viruses and other germs. If soap and water are not available, use hand sanitizer.  Do not smoke. Avoid being around people who are smoking (secondhand smoke).  Keep all follow-up visits as told by your health care provider. This is important. Contact a health care provider if:  You have a  fever.  Your symptoms get worse.  Your symptoms do not improve within 10 days. Get help right away if:  You have a severe headache.  You have persistent vomiting.  You have pain or swelling around your face or eyes.  You have vision problems.  You develop confusion.  Your neck is stiff.  You have trouble breathing. This information is not intended to replace advice given to you by your health care provider. Make sure you discuss any questions you have with your health care provider. Document Released: 02/08/2005 Document Revised: 10/05/2015 Document Reviewed: 12/04/2014 Elsevier Interactive Patient Education  2017 [employer].

## 2016-07-29 NOTE — Telephone Encounter (Signed)
Patient would like to transfer PCP if okay with providers.

## 2016-07-29 NOTE — Progress Notes (Signed)
[name] White is a 51 y.o. female here for Sinus problem  I acted as a Education administrator for [employer], PA-C Anselmo Pickler, LPN  History of Present Illness:   No chief complaint on file.   Sinus Problem  This is a recurrent problem. Episode onset: it has  been going on for 2 weeks and antibiotic did not help. The problem is unchanged. There has been no fever. She is experiencing no pain. Associated symptoms include congestion, coughing, headaches, a hoarse voice and shortness of breath. Pertinent negatives include no chills, ear pain, sinus pressure, sneezing, sore throat or swollen glands. Past treatments include antibiotics (Mucinex and OTC alllergy medication.). The treatment provided no relief.  Cough  This is a recurrent problem. Episode onset: x 2 weeks. The problem has been unchanged. The problem occurs constantly. The cough is non-productive. Associated symptoms include headaches, nasal congestion, postnasal drip, shortness of breath and wheezing. Pertinent negatives include no chest pain, chills, ear congestion, ear pain, fever, sore throat or sweats. The symptoms are aggravated by lying down. Treatments tried: Mucinex and antihistamine. The treatment provided no relief. Her past medical history is significant for bronchitis.   She had brain MRI done on 07/26/2016 which found "significant paranasal sinus disease, with both chronic and acute components."  She has an appointment with ENT in July but is calling frequently to see if she can get an earlier appointment.  PMHx, SurgHx, SocialHx, Medications, and Allergies were reviewed in the Visit Navigator and updated as appropriate.  Current Medications:   Current Outpatient Prescriptions:  .  ALPRAZolam (XANAX) 1 MG tablet, TAKE 1/2 TO 1 (ONE-HALF TO ONE) TABLET BY MOUTH AT BEDTIME AS NEEDED, Disp: 30 tablet, Rfl: 2 .  benzonatate (TESSALON) 200 MG capsule, Take 1 capsule (200 mg total) by mouth 2 (two) times daily as needed for cough., Disp:  20 capsule, Rfl: 0 .  lidocaine (XYLOCAINE) 2 % solution, Use as directed 20 mLs in the mouth or throat as needed for mouth pain., Disp: 200 mL, Rfl: 2 .  LINZESS 290 MCG CAPS capsule, , Disp: , Rfl: 0 .  meclizine (ANTIVERT) 25 MG tablet, TAKE ONE TABLET BY MOUTH THREE TIMES DAILY AS NEEDED FOR DIZZINESS, Disp: 60 tablet, Rfl: 0 .  naproxen (NAPROSYN) 500 MG tablet, Take 1 tablet (500 mg total) by mouth every 12 (twelve) hours as needed., Disp: 16 tablet, Rfl: 5 .  ondansetron (ZOFRAN) 4 MG tablet, TAKE ONE TABLET BY MOUTH EVERY 8 HOURS AS NEEDED FOR NAUSEA OR VOMITING, Disp: 30 tablet, Rfl: 1 .  pantoprazole (PROTONIX) 40 MG tablet, Take 1 tablet (40 mg total) by mouth daily., Disp: 90 tablet, Rfl: 3 .  ranitidine (ZANTAC) 150 MG tablet, Take 1 tablet (150 mg total) by mouth at bedtime., Disp: 30 tablet, Rfl: 3 .  rOPINIRole (REQUIP) 1 MG tablet, TAKE ONE TO TWO TABLETS BY MOUTH AT BEDTIME, Disp: 60 tablet, Rfl: 3 .  SUMAtriptan (IMITREX) 100 MG tablet, Take 1 at earliest onset of headache.  May repeat once in 2 hours if headache persists or recurs.  Do not exceed 2 tablets in 24 hours, Disp: 10 tablet, Rfl: 2 .  topiramate (TOPAMAX) 50 MG tablet, TAKE THREE TABLETS BY MOUTH AT BEDTIME, Disp: 270 tablet, Rfl: 1 .  chlorpheniramine-HYDROcodone (TUSSIONEX PENNKINETIC ER) 10-8 MG/5ML SUER, Take 5 mLs by mouth at bedtime as needed for cough. (Patient not taking: Reported on 07/29/2016), Disp: 60 mL, Rfl: 0 .  levofloxacin (LEVAQUIN) 500 MG tablet, Take  1 tablet (500 mg total) by mouth daily., Disp: 7 tablet, Rfl: 0 .  predniSONE (DELTASONE) 20 MG tablet, Take 2 tablets (40 mg total) by mouth daily., Disp: 10 tablet, Rfl: 0   Review of Systems:   Review of Systems  Constitutional: Negative for chills and fever.  HENT: Positive for congestion, hoarse voice and postnasal drip. Negative for ear pain, sinus pressure, sneezing and sore throat.   Respiratory: Positive for cough, shortness of breath and  wheezing.   Cardiovascular: Negative for chest pain.  Neurological: Positive for headaches.    Vitals:   Vitals:   07/29/16 1416  BP: 110/68  Pulse: 78  Temp: 98.3 F (36.8 C)  TempSrc: Oral  SpO2: 99%  Weight: 166 lb (75.3 kg)  Height: 5\' 5"  (1.651 m)     Body mass index is 27.62 kg/m.  Physical Exam:   Physical Exam  Constitutional: She appears well-developed. She is cooperative.  Non-toxic appearance. She does not have a sickly appearance. She does not appear ill. No distress.  HENT:  Head: Normocephalic and atraumatic.  Right Ear: Tympanic membrane, external ear and ear canal normal. Tympanic membrane is not erythematous, not retracted and not bulging.  Left Ear: Tympanic membrane, external ear and ear canal normal. Tympanic membrane is not erythematous, not retracted and not bulging.  Nose: Mucosal edema and rhinorrhea present. Right sinus exhibits maxillary sinus tenderness. Right sinus exhibits no frontal sinus tenderness. Left sinus exhibits maxillary sinus tenderness. Left sinus exhibits no frontal sinus tenderness.  Mouth/Throat: Uvula is midline. Posterior oropharyngeal erythema present. No posterior oropharyngeal edema. Tonsils are 1+ on the right. Tonsils are 1+ on the left.  Eyes: Conjunctivae and lids are normal.  Neck: Trachea normal.  Cardiovascular: Normal rate, regular rhythm, S1 normal, S2 normal and normal heart sounds.   Pulmonary/Chest: Effort normal and breath sounds normal. She has no decreased breath sounds. She has no wheezes. She has no rhonchi. She has no rales.  Lymphadenopathy:    She has no cervical adenopathy.  Neurological: She is alert.  Skin: Skin is warm, dry and intact.  Psychiatric: She has a normal mood and affect. Her speech is normal and behavior is normal.  Nursing note and vitals reviewed.   Assessment and Plan:    Diagnoses and all orders for this visit:  Acute non-recurrent sinusitis, unspecified location  Other orders -      levofloxacin (LEVAQUIN) 500 MG tablet; Take 1 tablet (500 mg total) by mouth daily. -     predniSONE (DELTASONE) 20 MG tablet; Take 2 tablets (40 mg total) by mouth daily.   Given significance of paranasal sinus disease on MRI, will treat with levaquin and prednisone per orders. I encouraged patient to follow-up with ENT as soon as she can to further evaluate her MRI results. Follow-up with PCP if any concern for worsening symptoms or lack of improvement despite treatment.   . Reviewed expectations re: course of current medical issues. . Discussed self-management of symptoms. . Outlined signs and symptoms indicating need for more acute intervention. . Patient verbalized understanding and all questions were answered. . See orders for this visit as documented in the electronic medical record. . Patient received an After-Visit Summary.  CMA or LPN served as scribe during this visit. History, Physical, and Plan performed by medical provider. Documentation and orders reviewed and attested to.  Inda Coke, PA-C

## 2016-07-29 NOTE — Telephone Encounter (Signed)
Error

## 2016-07-30 ENCOUNTER — Ambulatory Visit: Payer: BLUE CROSS/BLUE SHIELD | Admitting: Internal Medicine

## 2016-07-30 NOTE — Telephone Encounter (Signed)
Ok with me 

## 2016-07-30 NOTE — Telephone Encounter (Signed)
ok 

## 2016-08-02 ENCOUNTER — Encounter: Payer: Self-pay | Admitting: Physician Assistant

## 2016-08-02 ENCOUNTER — Other Ambulatory Visit: Payer: Self-pay | Admitting: Physician Assistant

## 2016-08-02 DIAGNOSIS — R634 Abnormal weight loss: Secondary | ICD-10-CM

## 2016-08-05 ENCOUNTER — Telehealth: Payer: Self-pay | Admitting: Internal Medicine

## 2016-08-05 NOTE — Telephone Encounter (Signed)
Unaware of this

## 2016-08-05 NOTE — Telephone Encounter (Signed)
Checking on the status for an orthotic brace that was faxed over in the past week.

## 2016-08-06 ENCOUNTER — Other Ambulatory Visit: Payer: Self-pay | Admitting: Neurology

## 2016-08-13 ENCOUNTER — Encounter: Payer: Self-pay | Admitting: Gastroenterology

## 2016-08-16 ENCOUNTER — Other Ambulatory Visit: Payer: Self-pay | Admitting: Internal Medicine

## 2016-08-17 ENCOUNTER — Encounter: Payer: Self-pay | Admitting: Gastroenterology

## 2016-08-17 ENCOUNTER — Encounter: Payer: Self-pay | Admitting: Physician Assistant

## 2016-08-17 ENCOUNTER — Ambulatory Visit (AMBULATORY_SURGERY_CENTER): Payer: Self-pay

## 2016-08-17 VITALS — Ht 66.0 in | Wt 168.0 lb

## 2016-08-17 DIAGNOSIS — R1010 Upper abdominal pain, unspecified: Secondary | ICD-10-CM

## 2016-08-17 NOTE — Progress Notes (Signed)
Per pt, no allergies to soy or egg products.Pt not taking any weight loss meds or using  O2 at home.  Pt states she had an endoscopy and colonoscopy with Dr Collene Mares in  Oct. 2017. Pt filled out medical release form which will be sent to Dr Silverio Decamp CMA.   Pt refused Emmi video.

## 2016-08-20 ENCOUNTER — Other Ambulatory Visit: Payer: Self-pay | Admitting: Physician Assistant

## 2016-08-20 DIAGNOSIS — R634 Abnormal weight loss: Secondary | ICD-10-CM

## 2016-08-23 ENCOUNTER — Encounter: Payer: Self-pay | Admitting: Gastroenterology

## 2016-08-23 ENCOUNTER — Ambulatory Visit (AMBULATORY_SURGERY_CENTER): Payer: BLUE CROSS/BLUE SHIELD | Admitting: Gastroenterology

## 2016-08-23 VITALS — BP 94/62 | HR 73 | Temp 98.2°F | Resp 15 | Ht 66.0 in | Wt 168.0 lb

## 2016-08-23 DIAGNOSIS — R1010 Upper abdominal pain, unspecified: Secondary | ICD-10-CM | POA: Diagnosis present

## 2016-08-23 DIAGNOSIS — K209 Esophagitis, unspecified: Secondary | ICD-10-CM | POA: Diagnosis not present

## 2016-08-23 MED ORDER — SODIUM CHLORIDE 0.9 % IV SOLN: 500.0000 mL | INTRAVENOUS | Status: DC

## 2016-08-23 NOTE — Patient Instructions (Signed)
Handout given on Gastroparesis   YOU HAD AN ENDOSCOPIC PROCEDURE TODAY: Refer to the procedure report and other information in the discharge instructions given to you for any specific questions about what was found during the examination. If this information does not answer your questions, please call [place] office at [phone] to clarify.   YOU SHOULD EXPECT: Some feelings of bloating in the abdomen. Passage of more gas than usual. Walking can help get rid of the air that was put into your GI tract during the procedure and reduce the bloating. If you had a lower endoscopy (such as a colonoscopy or flexible sigmoidoscopy) you may notice spotting of blood in your stool or on the toilet paper. Some abdominal soreness may be present for a day or two, also.  DIET: Your first meal following the procedure should be a light meal and then it is ok to progress to your normal diet. A half-sandwich or bowl of soup is an example of a good first meal. Heavy or fried foods are harder to digest and may make you feel nauseous or bloated. Drink plenty of fluids but you should avoid alcoholic beverages for 24 hours. If you had a esophageal dilation, please see attached instructions for diet.    ACTIVITY: Your care partner should take you home directly after the procedure. You should plan to take it easy, moving slowly for the rest of the day. You can resume normal activity the day after the procedure however YOU SHOULD NOT DRIVE, use power tools, machinery or perform tasks that involve climbing or major physical exertion for 24 hours (because of the sedation medicines used during the test).   SYMPTOMS TO REPORT IMMEDIATELY: A gastroenterologist can be reached at any hour. Please call [phone]  for any of the following symptoms:   Following upper endoscopy (EGD, EUS, ERCP, esophageal dilation) Vomiting of blood or coffee ground material  New, significant abdominal pain  New, significant chest pain or pain  under the shoulder blades  Painful or persistently difficult swallowing  New shortness of breath  Black, tarry-looking or red, bloody stools  FOLLOW UP:  If any biopsies were taken you will be contacted by phone or by letter within the next 1-3 weeks. Call [phone]  if you have not heard about the biopsies in 3 weeks.  Please also call with any specific questions about appointments or follow up tests.

## 2016-08-23 NOTE — Op Note (Signed)
[facility] Patient Name: [name] White Procedure Date: 08/23/2016 8:41 AM MRN: [id number] Endoscopist: Mauri Pole , MD Age: 51 Referring MD:  Date of Birth: 08-26-1965 Gender: Female Account #: [id number] Procedure:                Upper GI endoscopy Indications:              Upper abdominal symptoms that persist despite an                            appropriate trial of therapy, Upper abdominal                            symptoms associated with anorexia (suggesting                            structural disease), Upper abdominal symptoms                            associated with weight loss (suggesting structural                            disease), Epigastric abdominal pain Medicines:                Monitored Anesthesia Care Procedure:                Pre-Anesthesia Assessment:                           - Prior to the procedure, a History and Physical                            was performed, and patient medications and                            allergies were reviewed. The patient's tolerance of                            previous anesthesia was also reviewed. The risks                            and benefits of the procedure and the sedation                            options and risks were discussed with the patient.                            All questions were answered, and informed consent                            was obtained. Prior Anticoagulants: The patient has                            taken no previous anticoagulant or antiplatelet  agents. ASA Grade Assessment: II - A patient with                            mild systemic disease. After reviewing the risks                            and benefits, the patient was deemed in                            satisfactory condition to undergo the procedure.                           After obtaining informed consent, the endoscope was                            passed under direct  vision. Throughout the                            procedure, the patient's blood pressure, pulse, and                            oxygen saturations were monitored continuously. The                            Endoscope was introduced through the mouth, and                            advanced to the second part of duodenum. The upper                            GI endoscopy was accomplished without difficulty.                            The patient tolerated the procedure well. Scope In: Scope Out: Findings:                 LA Grade B (one or more mucosal breaks greater than                            5 mm, not extending between the tops of two mucosal                            folds) esophagitis with no bleeding was found 34 to                            38 cm from the incisors.                           A large amount of food (residue) was found in the                            gastric fundus and in the gastric body. No evidence  of pyloric stenosis or mass lesion. Rest of the                            stomach limited views to large amount of residual                            food.                           Food (residue) was found in the duodenal bulb and                            in the second portion of the duodenum. Complications:            No immediate complications. Estimated Blood Loss:     Estimated blood loss: none. Impression:               - LA Grade B reflux esophagitis.                           - A large amount of food (residue) in the stomach.                           - Retained food in the duodenum.                           - No specimens collected. Recommendation:           - Patient has a contact number available for                            emergencies. The signs and symptoms of potential                            delayed complications were discussed with the                            patient. Return to normal activities tomorrow.                             Written discharge instructions were provided to the                            patient.                           - Gastroparesis diet (small frequest meals, avoid                            high fiber and high fat diet)                           - Continue present medications.                           - No aspirin, ibuprofen, naproxen, or other  non-steroidal anti-inflammatory drugs.                           - Repeat upper endoscopy in 3 months because the                            preparation was poor.                           - Return to GI clinic at the next available                            appointment.                           - Recommend 4 hour gastric emptying nuclear med                            scan at appointment to be scheduled. Mauri Pole, MD 08/23/2016 8:55:48 AM This report has been signed electronically.

## 2016-08-23 NOTE — Progress Notes (Signed)
Pt's states no medical or surgical changes since previsit or office visit. 

## 2016-08-23 NOTE — Progress Notes (Signed)
To recovery, report to Thomas, RN, VSS 

## 2016-08-24 ENCOUNTER — Encounter: Payer: Self-pay | Admitting: Physician Assistant

## 2016-08-24 ENCOUNTER — Telehealth: Payer: Self-pay | Admitting: *Deleted

## 2016-08-24 NOTE — Telephone Encounter (Signed)
  Follow up Call-  Call back number 08/23/2016  Post procedure Call Back phone  # [phone]  Permission to leave phone message Yes  Some recent data might be hidden     Patient questions:  Do you have a fever, pain , or abdominal swelling? No. Pain Score  0 *  Have you tolerated food without any problems? Yes.    Have you been able to return to your normal activities? Yes.    Do you have any questions about your discharge instructions: Diet   No. Medications  No. Follow up visit  Yes.    Do you have questions or concerns about your Care? Yes.    Actions: * If pain score is 4 or above: No action needed, pain <4.  Pt. States she is nauseated and has a sore throat today.  States Nausea is nothing new.  Advised her to call us if sore throat doesn't resolve or becomes worse. She wondered is she was going to see Dr. Silverio Decamp before September.  I told her that report recommends coming back for upper endoscopy in September because prep was poor yesterday.  I told her that her report advised next avalible in office app. With Dr. Silverio Decamp. She will call office for appointment if she hasn't been contacted by end of this week. She wonders Why gastric emptying is recommended  Because she had one in May.  I advised her to talk with Dr. Silverio Decamp about this at office visit.  She verbalized understanding.

## 2016-08-24 NOTE — Telephone Encounter (Signed)
No answer, message left for the patient. 

## 2016-08-26 ENCOUNTER — Other Ambulatory Visit: Payer: Self-pay

## 2016-08-26 ENCOUNTER — Telehealth: Payer: Self-pay

## 2016-08-26 DIAGNOSIS — R1010 Upper abdominal pain, unspecified: Secondary | ICD-10-CM

## 2016-08-26 DIAGNOSIS — J324 Chronic pansinusitis: Secondary | ICD-10-CM | POA: Insufficient documentation

## 2016-08-26 DIAGNOSIS — R630 Anorexia: Secondary | ICD-10-CM

## 2016-08-26 DIAGNOSIS — R634 Abnormal weight loss: Secondary | ICD-10-CM

## 2016-08-26 NOTE — Telephone Encounter (Signed)
Her last gastric emptying scan came back as normal but she had significant amount of retained food in stomach. I am concerned about possible gastroparesis, and want to repeat the gastric emptying scan.

## 2016-08-26 NOTE — Telephone Encounter (Signed)
Patient had her procedure 08/23/16 and gastric emptying study was recommended.  Patient wants to know why she needs to repeat this study. She cannot remember the conversation after her EGD.

## 2016-08-26 NOTE — Telephone Encounter (Signed)
Patient agrees to repeat the test.  She will go to [facility] 09/08/16 arriving at 7:15 am fasting. Hold stomach meds for 8 hours prior to study. She states she is alternating between diarrhea and being unable to move her bowels without self disimpacting. But no bowel movement today. She is taking daily Linzess.

## 2016-08-31 ENCOUNTER — Encounter: Payer: Self-pay | Admitting: Gastroenterology

## 2016-09-08 ENCOUNTER — Encounter (HOSPITAL_COMMUNITY)
Admission: RE | Admit: 2016-09-08 | Discharge: 2016-09-08 | Disposition: A | Discharge status: Home / Self Care | Payer: BLUE CROSS/BLUE SHIELD | Source: Ambulatory Visit | Attending: Gastroenterology | Admitting: Gastroenterology

## 2016-09-08 DIAGNOSIS — R630 Anorexia: Secondary | ICD-10-CM | POA: Diagnosis present

## 2016-09-08 DIAGNOSIS — R634 Abnormal weight loss: Secondary | ICD-10-CM | POA: Diagnosis present

## 2016-09-08 DIAGNOSIS — R1010 Upper abdominal pain, unspecified: Secondary | ICD-10-CM | POA: Insufficient documentation

## 2016-09-08 MED ORDER — TECHNETIUM TC 99M SULFUR COLLOID
1.8700 | Freq: Once | INTRAVENOUS | Status: AC | PRN
  Administered 2016-09-08: 1.87 via INTRAVENOUS

## 2016-09-10 ENCOUNTER — Encounter: Payer: Self-pay | Admitting: Gastroenterology

## 2016-09-13 ENCOUNTER — Other Ambulatory Visit: Payer: Self-pay

## 2016-09-13 MED ORDER — LUBIPROSTONE 24 MCG PO CAPS: 24.0000 ug | ORAL_CAPSULE | Freq: Two times a day (BID) | ORAL | 3 refills | Status: DC

## 2016-09-16 ENCOUNTER — Other Ambulatory Visit: Payer: Self-pay | Admitting: Internal Medicine

## 2016-09-17 ENCOUNTER — Encounter: Payer: Self-pay | Admitting: Physician Assistant

## 2016-09-17 LAB — SEDIMENTATION RATE: Sed Rate: 20

## 2016-09-21 ENCOUNTER — Encounter: Payer: Self-pay | Admitting: Physician Assistant

## 2016-09-21 LAB — SEDIMENTATION RATE
C-Reactive Protein, Quant: 0.8
HLA-B27: NEGATIVE
Sed Rate: 20

## 2016-09-24 DIAGNOSIS — M1990 Unspecified osteoarthritis, unspecified site: Secondary | ICD-10-CM | POA: Insufficient documentation

## 2016-09-24 DIAGNOSIS — M47816 Spondylosis without myelopathy or radiculopathy, lumbar region: Secondary | ICD-10-CM | POA: Insufficient documentation

## 2016-09-26 ENCOUNTER — Other Ambulatory Visit: Payer: Self-pay | Admitting: Internal Medicine

## 2016-09-27 NOTE — Telephone Encounter (Signed)
Routing to [employer] patients PCP

## 2016-09-28 NOTE — Telephone Encounter (Signed)
Patient has been scheduled for tomorrow at 10am for a refill on ondansetron (ZOFRAN) 4 MG tablet and an official transfer appt.

## 2016-09-28 NOTE — Telephone Encounter (Signed)
Please call patient and schedule an appointment as per [facility] message. Please schedule Establish Care appointment. Thanks

## 2016-09-28 NOTE — Telephone Encounter (Signed)
Noted  

## 2016-09-28 NOTE — Telephone Encounter (Signed)
Can patient come in so we can review her medications and current plan of care? I would like to be on the same page with her regarding her chronic health issues prior to refilling medications. I've agreed to be her PCP, but have only seen her for sick visits. Thanks!  Inda Coke PA-C 09/28/16

## 2016-09-29 ENCOUNTER — Ambulatory Visit: Payer: BLUE CROSS/BLUE SHIELD | Admitting: Physician Assistant

## 2016-09-29 ENCOUNTER — Other Ambulatory Visit: Payer: Self-pay | Admitting: Sports Medicine

## 2016-09-29 DIAGNOSIS — M545 Low back pain: Principal | ICD-10-CM

## 2016-09-29 DIAGNOSIS — G8929 Other chronic pain: Secondary | ICD-10-CM

## 2016-10-06 ENCOUNTER — Ambulatory Visit
Admission: RE | Admit: 2016-10-06 | Discharge: 2016-10-06 | Disposition: A | Discharge status: Home / Self Care | Payer: BLUE CROSS/BLUE SHIELD | Source: Ambulatory Visit | Attending: Sports Medicine | Admitting: Sports Medicine

## 2016-10-06 DIAGNOSIS — M545 Low back pain: Principal | ICD-10-CM

## 2016-10-06 DIAGNOSIS — G8929 Other chronic pain: Secondary | ICD-10-CM

## 2016-10-06 MED ORDER — IOPAMIDOL (ISOVUE-M 200) INJECTION 41%
1.0000 mL | Freq: Once | INTRAMUSCULAR | Status: AC
  Administered 2016-10-06: 1 mL via EPIDURAL

## 2016-10-06 MED ORDER — METHYLPREDNISOLONE ACETATE 40 MG/ML INJ SUSP (RADIOLOG
120.0000 mg | Freq: Once | INTRAMUSCULAR | Status: AC
  Administered 2016-10-06: 120 mg via EPIDURAL

## 2016-10-06 NOTE — Discharge Instructions (Signed)

## 2016-10-07 ENCOUNTER — Encounter: Payer: Self-pay | Admitting: Physician Assistant

## 2016-10-07 ENCOUNTER — Ambulatory Visit (INDEPENDENT_AMBULATORY_CARE_PROVIDER_SITE_OTHER): Payer: BLUE CROSS/BLUE SHIELD | Admitting: Physician Assistant

## 2016-10-07 VITALS — BP 100/60 | HR 78 | Temp 98.3°F | Wt 164.0 lb

## 2016-10-07 DIAGNOSIS — R197 Diarrhea, unspecified: Secondary | ICD-10-CM

## 2016-10-07 DIAGNOSIS — K3184 Gastroparesis: Secondary | ICD-10-CM | POA: Diagnosis not present

## 2016-10-07 DIAGNOSIS — G4700 Insomnia, unspecified: Secondary | ICD-10-CM | POA: Diagnosis not present

## 2016-10-07 DIAGNOSIS — R110 Nausea: Secondary | ICD-10-CM

## 2016-10-07 LAB — COMPREHENSIVE METABOLIC PANEL
ALBUMIN: 4.2 g/dL (ref 3.5–5.2)
ALK PHOS: 65 U/L (ref 39–117)
ALT: 22 U/L (ref 0–35)
AST: 17 U/L (ref 0–37)
BILIRUBIN TOTAL: 0.5 mg/dL (ref 0.2–1.2)
BUN: 15 mg/dL (ref 6–23)
CALCIUM: 9.3 mg/dL (ref 8.4–10.5)
CO2: 28 meq/L (ref 19–32)
CREATININE: 0.7 mg/dL (ref 0.40–1.20)
Chloride: 104 mEq/L (ref 96–112)
GFR: 93.4 mL/min (ref >60.00)
Glucose, Bld: 88 mg/dL (ref 70–99)
Potassium: 3.9 mEq/L (ref 3.5–5.1)
Sodium: 137 mEq/L (ref 135–145)
TOTAL PROTEIN: 6.6 g/dL (ref 6.0–8.3)

## 2016-10-07 LAB — MAGNESIUM: Magnesium: 2.1 mg/dL (ref 1.5–2.5)

## 2016-10-07 MED ORDER — ONDANSETRON HCL 4 MG PO TABS
ORAL_TABLET | ORAL | 1 refills | Status: DC
Start: 2016-10-07 — End: 2016-11-08

## 2016-10-07 MED ORDER — ALPRAZOLAM 1 MG PO TABS: ORAL_TABLET | ORAL | 1 refills | Status: DC

## 2016-10-07 NOTE — Patient Instructions (Addendum)
It was great to see you!  Consider starting a probiotic. I recommend Align, Culturelle or Digestive Advantage.

## 2016-10-07 NOTE — Progress Notes (Addendum)
[name] White is a 51 y.o. female here for a new problem.  History of Present Illness:   Chief Complaint  Patient presents with  . Medication Refill  . Establish Care   Patient is here to transfer care from Dr. Pricilla Holm at [facility]. She  has a past medical history of Anxiety; Arthritis; Barrett's esophagus; Chronic headache; Colon polyp; Depression; Grief reaction; Hidradenitis suppurativa; IBS (irritable bowel syndrome); Migraine; OSA (obstructive sleep apnea); Ovarian cyst; Restless leg; and Tobacco abuse.   Per chart review, she has had severe nausea associated with decreased appetite and weight loss in October 2017. Weight at this time was around 215 lb. She has had extensive testing including multiple EGDs, colonoscopy, CT of chest, abdomen, pelvis, CT angiogram of head, MRI of brain. 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  She is currently seeing rheumatology, who found no evidence of active inflammatory arthritis and referred her to ortho with a plan for MRI of spine.  Acute Concerns: Nausea - twice a week will take 1 pill of zofran which has been helpful for her nausea, has been on this for at least 1 year. She has been diagnosed by her GI provider with gastroparesis despite normal gastric emptying studies but significant retention of food in stomach during upper endoscopy.  Gastroparesis - see above. Dietary recall reveals 1 large meal daily -- yesterday she had a country style steak with mashed potatoes and watermelon. She reports that after her diagnosis of gastroparesis she was given diet education and she attempted to do small, frequent meals for about 1 month butShe was doing small, frequent meals but was having swelling in her hands and legs.  Was drinking protein shakes too. Weight started to creep up. Diarrhea  -- constipation for 1 week and then turns into diarrhea. Significant hx of IBS.  Linzess wasn't  working for constipation. Changed from [place] to [place]. Has been on this for 1 month. Amitiza has caused some incontinence of diarrhea at bedtime. No blood in stool. No unusual fatigue, prolonged antibiotics. She does feel like she probably isn't hydrating well. Blood pressure appears to usually run low. Denies dizziness. Insomnia -- takes 1/2 pill nightly of xanax to help her sleep, has done this for several years  BP Readings from Last 3 Encounters:  10/07/16 100/60  10/06/16 (!) 83/56  08/23/16 94/62    Health Maintenance: Immunizations -- up to date Colonoscopy -- Colonoscopy in June 2012 with removal of 2 polyps that were hyperplastic Mammogram -- 06/18/16 Weight -- Weight: 164 lb (74.4 kg)   Depression screen PHQ 2/9 03/11/2015  Decreased Interest 3  Down, Depressed, Hopeless 2  PHQ - 2 Score 5  Altered sleeping -  Tired, decreased energy -  Change in appetite -  Feeling bad or failure about yourself  -  Trouble concentrating -  Moving slowly or fidgety/restless -  Suicidal thoughts -  PHQ-9 Score -  Difficult doing work/chores -  Some recent data might be hidden   No flowsheet data found.  Other providers/specialists: Dr. Silverio Decamp -- GI Dr. Redmond Baseman - ENT Dr. Tomi Likens - Neurology Dr. Halford Chessman -- Pulmonology Rheumatology Orthopedist  Past Medical History:  Diagnosis Date  . Anxiety    per pt, has panic attacks  . Arthritis    in back per pt.  . Barrett's esophagus    egd 2007 + 2009 but none 2011 or 08/2010 egd  . Chronic headache   .  Colon polyp    hyperplastic  . Depression    hx of suicide attempt in 2003  . Grief reaction   . Hidradenitis suppurativa   . IBS (irritable bowel syndrome)   . Migraine   . OSA (obstructive sleep apnea)   . Ovarian cyst   . Restless leg   . Tobacco abuse      Social History   Social History  . Marital status: Divorced    Spouse name: N/A  . Number of children: 2  . Years of education: N/A   Occupational History  .  Bartender    Social History Main Topics  . Smoking status: Former Smoker    Packs/day: 0.50    Years: 30.00    Types: Cigarettes    Quit date: 06/21/2015  . Smokeless tobacco: Never Used  . Alcohol use 0.0 oz/week     Comment: 2 x a month  . Drug use: No  . Sexual activity: Not on file   Other Topics Concern  . Not on file   Social History Narrative   Occupation: bartender   Divorced  -- currently single   Alcohol use-yes -x 5 per weekend   No longer a smoker    Past Surgical History:  Procedure Laterality Date  . ABDOMINAL HYSTERECTOMY     secondary to endometriosis  . CESAREAN SECTION     x 2  . CHOLECYSTECTOMY    . COLONOSCOPY W/ POLYPECTOMY  2012  . ELBOW SURGERY     left  . LAPAROSCOPY  04-22-10   lysis of adhesions  . LAPAROSCOPY  04-30-10    with mini laparotomy and cystotomy repair  . plantar fascitis     right foot  . SALPINGECTOMY  04-22-10   right  . TUBAL LIGATION    . WRIST SURGERY     right/ganglion cyst    Family History  Problem Relation Age of Onset  . COPD Mother   . Stroke Mother   . Coronary artery disease Mother        pacer  . Colon polyps Mother   . Breast cancer Mother   . Migraines Mother   . Other Mother        RSDS  . Rheum arthritis Mother   . Hypertension Father   . Colon polyps Brother   . Colon cancer Maternal Grandfather   . Colon polyps Maternal Grandfather   . Breast cancer Maternal Aunt   . Uterine cancer Cousin        x 2  . Aneurysm Maternal Grandmother   . Kidney disease Brother   . Other Brother        back pain  . Kidney cancer Paternal Aunt   . Stomach cancer Neg Hx     Allergies  Allergen Reactions  . Morphine And Related Hives  . Penicillins Hives          Current Medications:   Current Outpatient Prescriptions:  .  ALPRAZolam (XANAX) 1 MG tablet, TAKE 1/2 TO 1 (ONE-HALF TO ONE) TABLET BY MOUTH AT BEDTIME AS NEEDED, Disp: 30 tablet, Rfl: 1 .  lidocaine (XYLOCAINE) 2 % solution, Use as directed  20 mLs in the mouth or throat as needed for mouth pain., Disp: 200 mL, Rfl: 2 .  lubiprostone (AMITIZA) 24 MCG capsule, Take 1 capsule (24 mcg total) by mouth 2 (two) times daily with a meal., Disp: 60 capsule, Rfl: 3 .  meclizine (ANTIVERT) 25 MG tablet, TAKE ONE TABLET BY  MOUTH THREE TIMES DAILY AS NEEDED FOR DIZZINESS, Disp: 60 tablet, Rfl: 0 .  ondansetron (ZOFRAN) 4 MG tablet, TAKE ONE TABLET BY MOUTH EVERY 8 HOURS AS NEEDED FOR NAUSEA OR VOMITING, Disp: 30 tablet, Rfl: 1 .  pantoprazole (PROTONIX) 40 MG tablet, Take 1 tablet (40 mg total) by mouth daily., Disp: 90 tablet, Rfl: 3 .  ranitidine (ZANTAC) 150 MG tablet, Take 1 tablet (150 mg total) by mouth at bedtime., Disp: 30 tablet, Rfl: 3 .  rOPINIRole (REQUIP) 1 MG tablet, TAKE 1 TO 2 TABLETS BY MOUTH AT BEDTIME, Disp: 60 tablet, Rfl: 3 .  SUMAtriptan (IMITREX) 100 MG tablet, TAKE 1 AT THE EARLIEST ONSET OF HEADACHE. MAY REPEAT ONCE IN 2 HOURS IF HEADACHE PERSISTS OR RECURS. DO NOT EXCEED 2 TABLETS IN 24 HOURS, Disp: 10 tablet, Rfl: 2 .  topiramate (TOPAMAX) 50 MG tablet, TAKE THREE TABLETS BY MOUTH AT BEDTIME, Disp: 270 tablet, Rfl: 1 .  LINZESS 290 MCG CAPS capsule, daily before breakfast. , Disp: , Rfl: 0 .  naproxen (NAPROSYN) 500 MG tablet, Take 1 tablet (500 mg total) by mouth every 12 (twelve) hours as needed. (Patient not taking: Reported on 08/17/2016), Disp: 16 tablet, Rfl: 5 .  traMADol (ULTRAM) 50 MG tablet, , Disp: , Rfl: 0  Current Facility-Administered Medications:  .  0.9 %  sodium chloride infusion, 500 mL, Intravenous, Continuous, Nandigam, Kavitha V, MD   Review of Systems:   Review of Systems  Constitutional: Positive for weight loss. Negative for chills, fever and malaise/fatigue.  Respiratory: Negative for cough and shortness of breath.   Cardiovascular: Negative for chest pain, palpitations and leg swelling.  Gastrointestinal: Positive for abdominal pain and nausea. Negative for diarrhea, heartburn and vomiting.   Neurological: Negative for dizziness, tingling, tremors and headaches.    Vitals:   Vitals:   10/07/16 1119  BP: 100/60  Pulse: 78  Temp: 98.3 F (36.8 C)  TempSrc: Oral  SpO2: 99%  Weight: 164 lb (74.4 kg)     Body mass index is 26.47 kg/m.  Physical Exam:   Physical Exam  Constitutional: She appears well-developed. She is cooperative.  Non-toxic appearance. She does not have a sickly appearance. She does not appear ill. No distress.  Cardiovascular: Normal rate, regular rhythm, S1 normal, S2 normal, normal heart sounds and normal pulses.   No LE edema  Pulmonary/Chest: Effort normal and breath sounds normal.  Abdominal: Normal appearance and bowel sounds are normal. There is no tenderness. There is no rigidity, no rebound and no guarding.  Neurological: She is alert. GCS eye subscore is 4. GCS verbal subscore is 5. GCS motor subscore is 6.  Skin: Skin is warm, dry and intact.  Psychiatric: She has a normal mood and affect. Her speech is normal and behavior is normal.  Nursing note and vitals reviewed.     Assessment and Plan:    Oluwatoni was seen today for medication refill and establish care.  Diagnoses and all orders for this visit:  Diarrhea, unspecified type Believes that this is a side effect of Amitiza. I advised her to follow-up with GI. I will obtain CMP and magnesium to check lytes and liver/kidney function. Discussed need for hydration. If unable to replace fluids adequately, or develops lightheadedness/dizziness -- please seek medical attention, may need IVF. Also discussed taking probiotic. -     Comprehensive metabolic panel -     Magnesium  Nausea Well controlled with Zofran. I do suspect that if she were more adherent  to a gastroparesis-friendly diet, than her symptoms would be improved.   Gastroparesis Management per GI. Reinforced small, frequent meals with low fat options.  Insomnia, unspecified type [place] Controlled Substance Database reviewed  today regarding patient. Patient is compliant with [place] regarding pharmacy use and one-prescribing provider. It is appropriate to continue current medication regimen. Will refill xanax x 2 months. I advised her that I do not do long term benzo management. Patient is agreeable to switching to Dr. Briscoe Deutscher for this. Dr. Briscoe Deutscher in to meet patient. UDS at next visit.   Other orders -     ondansetron (ZOFRAN) 4 MG tablet; TAKE ONE TABLET BY MOUTH EVERY 8 HOURS AS NEEDED FOR NAUSEA OR VOMITING -     ALPRAZolam (XANAX) 1 MG tablet; TAKE 1/2 TO 1 (ONE-HALF TO ONE) TABLET BY MOUTH AT BEDTIME AS NEEDED    . Reviewed expectations re: course of current medical issues. . Discussed self-management of symptoms. . Outlined signs and symptoms indicating need for more acute intervention. . Patient verbalized understanding and all questions were answered. . See orders for this visit as documented in the electronic medical record. . Patient received an After-Visit Summary.   Inda Coke, PA-C

## 2016-10-29 ENCOUNTER — Other Ambulatory Visit: Payer: Self-pay | Admitting: Sports Medicine

## 2016-10-29 DIAGNOSIS — G8929 Other chronic pain: Secondary | ICD-10-CM

## 2016-10-29 DIAGNOSIS — M545 Low back pain: Principal | ICD-10-CM

## 2016-11-08 ENCOUNTER — Encounter: Payer: Self-pay | Admitting: Gastroenterology

## 2016-11-08 ENCOUNTER — Telehealth: Payer: Self-pay | Admitting: Gastroenterology

## 2016-11-08 ENCOUNTER — Ambulatory Visit (INDEPENDENT_AMBULATORY_CARE_PROVIDER_SITE_OTHER): Payer: BLUE CROSS/BLUE SHIELD | Admitting: Gastroenterology

## 2016-11-08 VITALS — BP 88/58 | HR 76 | Ht 65.0 in | Wt 166.6 lb

## 2016-11-08 DIAGNOSIS — K588 Other irritable bowel syndrome: Secondary | ICD-10-CM

## 2016-11-08 DIAGNOSIS — R630 Anorexia: Secondary | ICD-10-CM

## 2016-11-08 DIAGNOSIS — R634 Abnormal weight loss: Secondary | ICD-10-CM | POA: Diagnosis not present

## 2016-11-08 DIAGNOSIS — R197 Diarrhea, unspecified: Secondary | ICD-10-CM

## 2016-11-08 DIAGNOSIS — R110 Nausea: Secondary | ICD-10-CM | POA: Diagnosis not present

## 2016-11-08 MED ORDER — PANTOPRAZOLE SODIUM 40 MG PO TBEC: 40.0000 mg | DELAYED_RELEASE_TABLET | Freq: Every day | ORAL | 3 refills | Status: DC

## 2016-11-08 MED ORDER — ONDANSETRON HCL 4 MG PO TABS: ORAL_TABLET | ORAL | 11 refills | Status: DC

## 2016-11-08 MED ORDER — RIFAXIMIN 550 MG PO TABS: 550.0000 mg | ORAL_TABLET | Freq: Three times a day (TID) | ORAL | 0 refills | Status: DC

## 2016-11-08 MED ORDER — LUBIPROSTONE 24 MCG PO CAPS: 24.0000 ug | ORAL_CAPSULE | Freq: Two times a day (BID) | ORAL | 11 refills | Status: DC

## 2016-11-08 NOTE — Telephone Encounter (Signed)
Waiting on Dr Silverio Decamp to complete the note

## 2016-11-08 NOTE — Patient Instructions (Signed)
Go to the basement today for labs  We have sent Xifaxian to [facility] as we discussed  You will need to follow up after taking the Xifaxian with a 2 week supply of probiotics which we have given to you in the office  Align once daily  Eat small frequent meals  Stop Linzess  We will refill Zofran Amitiza and protonix to your [place]

## 2016-11-08 NOTE — Progress Notes (Signed)
[name] White    [id number]    October 09, 1965  Primary Care Physician:[place], [place], [place]  Referring Physician: Inda Coke, [address] [place], [place] [address]  Chief complaint:  Weight loss, nausea, diarrhea  HPI: Patient c/o continued lack of energy and appetite. She continues to loose weight. Multiple episodes of diarrhea yesterday with a liquid bowel movements, denies any blood or mucus in stool . No sick contacts . She doesn't recall eating anything different . No recent antibiotic use . Patient has intermittent episodes of diarrhea about once a month.  She underwent extensive GI work up that is unrevealing for any significant pathology other than retained gastric food bezoar on recent EGD. Subsequent gastric emptying scan showed normal gastric emptying time. She has significant back pain associated with arthritis and also nerve impingement. She sees orthopedics and is also scheduled to follow up with neurosurgeon. She is going to have repeat spine imaging and possible injection  Previous HPI: 51 year old female with history of chronic GERD, ? Barrett's esophagus remotely followed by Dr. Olevia Perches, was last seen 11/20/2010 is here to reestablish care.  Patient started having severe nausea associated with decreased appetite and weight loss in October 2017, she saw Dr. Collene Mares, underwent EGD and colonoscopy were unremarkable per patient. She continues to do have significant nausea with decreased appetite and has lost more than 50 pounds in the past few months. She is forcing herself to eat but she feels sick and vomits her entire meal sometime later. She was having irregular bowel habits with severe constipation alternating with diarrhea, she had episodes where did not have bowel movement for 3 weeks followed by diarrhea for a week. Her bowel habits more regular since she was started on Linzess 290 g daily by Dr. Sharlet Salina. She is currently having one to 2 formed bowel  movements daily. 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 Olevia Perches in the past, most recent in 2012 with no evidence of Barrett's esophagus. Prior to that in 2011 with no Barrett's .She had short segment or biopsies from likely gastric mucosa which showed possible Barrett's in 2007 and 2009 Colonoscopy in June 2012 with removal of 2 polyps that were hyperplastic    Outpatient Encounter Prescriptions as of 11/08/2016  Medication Sig  . ALPRAZolam (XANAX) 1 MG tablet TAKE 1/2 TO 1 (ONE-HALF TO ONE) TABLET BY MOUTH AT BEDTIME AS NEEDED  . lidocaine (XYLOCAINE) 2 % solution Use as directed 20 mLs in the mouth or throat as needed for mouth pain.  Marland Kitchen LINZESS 290 MCG CAPS capsule daily before breakfast.   . lubiprostone (AMITIZA) 24 MCG capsule Take 1 capsule (24 mcg total) by mouth 2 (two) times daily with a meal.  . meclizine (ANTIVERT) 25 MG tablet TAKE ONE TABLET BY MOUTH THREE TIMES DAILY AS NEEDED FOR DIZZINESS  . ondansetron (ZOFRAN) 4 MG tablet TAKE ONE TABLET BY MOUTH EVERY 8 HOURS AS NEEDED FOR NAUSEA OR VOMITING  . ranitidine (ZANTAC) 150 MG tablet Take 1 tablet (150 mg total) by mouth at bedtime.  Marland Kitchen rOPINIRole (REQUIP)  1 MG tablet TAKE 1 TO 2 TABLETS BY MOUTH AT BEDTIME  . SUMAtriptan (IMITREX) 100 MG tablet TAKE 1 AT THE EARLIEST ONSET OF HEADACHE. MAY REPEAT ONCE IN 2 HOURS IF HEADACHE PERSISTS OR RECURS. DO NOT EXCEED 2 TABLETS IN 24 HOURS  . topiramate (TOPAMAX) 50 MG tablet TAKE THREE TABLETS BY MOUTH AT BEDTIME  . traMADol (ULTRAM) 50 MG tablet   . naproxen (NAPROSYN) 500 MG tablet Take 1 tablet  (500 mg total) by mouth every 12 (twelve) hours as needed. (Patient not taking: Reported on 08/17/2016)  . pantoprazole (PROTONIX) 40 MG tablet Take 1 tablet (40 mg total) by mouth daily. (Patient not taking: Reported on 11/08/2016)   Facility-Administered Encounter Medications as of 11/08/2016  Medication  . 0.9 %  sodium chloride infusion    Allergies as of 11/08/2016 - Review Complete 11/08/2016  Allergen Reaction Noted  . Morphine and related Hives 08/06/2010  . Penicillins Hives 10/04/2008    Past Medical History:  Diagnosis Date  . Anterolisthesis   . Anxiety    per pt, has panic attacks  . Arthritis    in back per pt.  . Barrett's esophagus    egd 2007 + 2009 but none 2011 or 08/2010 egd  . Chronic headache   . Colon polyp    hyperplastic  . Depression    hx of suicide attempt in 2003  . Grief reaction   . Hidradenitis suppurativa   . IBS (irritable bowel syndrome)   . Migraine   . OSA (obstructive sleep apnea)   . Ovarian cyst   . Restless leg   . Tobacco abuse     Past Surgical History:  Procedure Laterality Date  . ABDOMINAL HYSTERECTOMY     secondary to endometriosis  . CESAREAN SECTION     x 2  . CHOLECYSTECTOMY    . COLONOSCOPY W/ POLYPECTOMY  2012  . ELBOW SURGERY     left  . LAPAROSCOPY  04-22-10   lysis of adhesions  . LAPAROSCOPY  04-30-10    with mini laparotomy and cystotomy repair  . plantar fascitis     right foot  . SALPINGECTOMY  04-22-10   right  . TUBAL LIGATION    . WRIST SURGERY     right/ganglion cyst    Family History  Problem Relation Age of Onset  . COPD Mother   . Stroke Mother   . Coronary artery disease Mother        pacer  . Colon polyps Mother   . Breast cancer Mother   . Migraines Mother   . Other Mother        RSDS  . Rheum arthritis Mother   . Hypertension Father   . Colon polyps Brother   . Colon cancer Maternal Grandfather   . Colon polyps Maternal Grandfather   . Breast cancer Maternal Aunt   . Uterine  cancer Cousin        x 2  . Aneurysm Maternal Grandmother   . Kidney disease Brother   . Other Brother        back pain  . Kidney cancer Paternal Aunt   . Stomach cancer Neg Hx     Social History   Social History  . Marital status: Divorced    Spouse name: N/A  . Number of children: 2  . Years of education: N/A   Occupational History  . Bartender    Social History Main Topics  . Smoking status: Former Smoker  Packs/day: 0.50    Years: 30.00    Types: Cigarettes    Quit date: 06/21/2015  . Smokeless tobacco: Never Used  . Alcohol use 0.0 oz/week     Comment: 2 x a month  . Drug use: No  . Sexual activity: Not on file   Other Topics Concern  . Not on file   Social History Narrative   Occupation: bartender   Divorced  -- currently single   Alcohol use-yes -x 5 per weekend   No longer a smoker      Review of systems: Review of Systems  Constitutional: Negative for fever and chills.  HENT: Negative.   Eyes: Negative for blurred vision.  Respiratory: Negative for cough, shortness of breath and wheezing.   Cardiovascular: Negative for chest pain and palpitations.  Gastrointestinal: as per HPI Genitourinary: Negative for dysuria, urgency, frequency and hematuria.  Musculoskeletal: Positive for myalgias, back pain and joint pain.  Skin: Negative for itching and rash.  Neurological: Negative for dizziness, tremors, focal weakness, seizures and loss of consciousness.  Endo/Heme/Allergies: Positive for seasonal allergies.  Psychiatric/Behavioral: Negative for depression, suicidal ideas and hallucinations.  All other systems reviewed and are negative.   Physical Exam: Vitals:   11/08/16 0840  BP: (!) 88/58  Pulse: 76   Body mass index is 27.72 kg/m. Gen:      No acute distress, Flat affect HEENT:  EOMI, sclera anicteric Neck:     No masses; no thyromegaly Lungs:    Clear to auscultation bilaterally; normal respiratory effort CV:         Regular rate and  rhythm; no murmurs Abd:      + bowel sounds; soft, non-tender; no palpable masses, no distension Ext:    No edema; adequate peripheral perfusion Skin:      Warm and dry; no rash Neuro: alert and oriented x 3 Psych: normal mood and affect  Data Reviewed:  Reviewed labs, radiology imaging, old records and pertinent past GI work up   Assessment and Plan/Recommendations:  51 year old female with history of irritable bowel syndrome with intermittent episodic diarrhea Follow up stool studies, O and P, Giardia and GI pathogen panel We will empirically treat with rifaximin 550 mg 3 times a day for 2 weeks for irritable bowel syndrome predominant diarrhea Followed by probiotic for 2 weeks  Advised patient to stop Linzess 290 g daily Continue Amitiza  Continues to have nausea and decreased appetite associated with weight loss Based on scale weight has beens stable in the past 4-5 months Small frequent meals and encouraged patient to eat high protein diet Continue Protonix Zofran as needed   25 minutes was spent face-to-face with the patient. Greater than 50% of the time used for counseling as well as treatment plan and follow-up. She had multiple questions which were answered to her satisfaction  K. Denzil Magnuson , MD [phone] Mon-Fri 8a-5p [phone] after 5p, weekends, holidays  CC: Inda Coke, [place]

## 2016-11-09 ENCOUNTER — Other Ambulatory Visit: Payer: BLUE CROSS/BLUE SHIELD

## 2016-11-09 DIAGNOSIS — K588 Other irritable bowel syndrome: Secondary | ICD-10-CM

## 2016-11-09 DIAGNOSIS — R197 Diarrhea, unspecified: Secondary | ICD-10-CM

## 2016-11-10 ENCOUNTER — Ambulatory Visit
Admission: RE | Admit: 2016-11-10 | Discharge: 2016-11-10 | Disposition: A | Discharge status: Home / Self Care | Payer: BLUE CROSS/BLUE SHIELD | Source: Ambulatory Visit | Attending: Sports Medicine | Admitting: Sports Medicine

## 2016-11-10 ENCOUNTER — Other Ambulatory Visit: Payer: Self-pay | Admitting: Sports Medicine

## 2016-11-10 DIAGNOSIS — M545 Low back pain: Principal | ICD-10-CM

## 2016-11-10 DIAGNOSIS — G8929 Other chronic pain: Secondary | ICD-10-CM

## 2016-11-10 LAB — OVA AND PARASITE EXAMINATION
CONCENTRATE RESULT: NONE SEEN
MICRO NUMBER:: 81029701
SPECIMEN QUALITY:: ADEQUATE
TRICHROME RESULT: NONE SEEN

## 2016-11-10 MED ORDER — METHYLPREDNISOLONE ACETATE 40 MG/ML INJ SUSP (RADIOLOG
120.0000 mg | Freq: Once | INTRAMUSCULAR | Status: AC
  Administered 2016-11-10: 120 mg via INTRA_ARTICULAR

## 2016-11-10 MED ORDER — METHYLPREDNISOLONE ACETATE 40 MG/ML INJ SUSP (RADIOLOG: 120.0000 mg | Freq: Once | INTRAMUSCULAR | Status: DC

## 2016-11-10 MED ORDER — IOPAMIDOL (ISOVUE-M 200) INJECTION 41%
1.0000 mL | Freq: Once | INTRAMUSCULAR | Status: AC
  Administered 2016-11-10: 1 mL via INTRA_ARTICULAR

## 2016-11-10 MED ORDER — IOPAMIDOL (ISOVUE-M 200) INJECTION 41%: 1.0000 mL | Freq: Once | INTRAMUSCULAR | Status: DC

## 2016-11-10 NOTE — Discharge Instructions (Signed)

## 2016-11-10 NOTE — Telephone Encounter (Signed)
Gae Bon from [facility] pharmacy requesting chart notes. F: [phone]

## 2016-11-10 NOTE — Telephone Encounter (Signed)
Faxed office notes to [facility] as requested

## 2016-11-11 ENCOUNTER — Encounter: Payer: Self-pay | Admitting: Gastroenterology

## 2016-11-12 LAB — GASTROINTESTINAL PATHOGEN PANEL PCR
C. DIFFICILE TOX A/B, PCR: NOT DETECTED
CAMPYLOBACTER, PCR: NOT DETECTED
CRYPTOSPORIDIUM, PCR: NOT DETECTED
E coli (ETEC) LT/ST PCR: NOT DETECTED
E coli (STEC) stx1/stx2, PCR: NOT DETECTED
E coli 0157, PCR: NOT DETECTED
Giardia lamblia, PCR: NOT DETECTED
NOROVIRUS, PCR: NOT DETECTED
ROTAVIRUS, PCR: NOT DETECTED
SALMONELLA, PCR: NOT DETECTED
Shigella, PCR: NOT DETECTED

## 2016-11-15 ENCOUNTER — Other Ambulatory Visit: Payer: Self-pay | Admitting: Neurology

## 2016-11-16 ENCOUNTER — Encounter: Payer: Self-pay | Admitting: Physician Assistant

## 2016-11-16 LAB — GIARDIA/CRYPTOSPORIDIUM (EIA)
MICRO NUMBER: 81029703
MICRO NUMBER:: 81029702
RESULT: NOT DETECTED
RESULT:: NOT DETECTED
SPECIMEN QUALITY:: ADEQUATE
SPECIMEN QUALITY:: ADEQUATE

## 2016-11-18 ENCOUNTER — Telehealth: Payer: Self-pay | Admitting: Gastroenterology

## 2016-11-18 MED ORDER — RIFAXIMIN 550 MG PO TABS: 550.0000 mg | ORAL_TABLET | Freq: Three times a day (TID) | ORAL | 0 refills | Status: DC

## 2016-11-18 NOTE — Telephone Encounter (Addendum)
Patient scheduled for 11:00am with Dr. Juleen China on 09/28  Unsure whether this is to transfer care?  Please advise.  Ty,  -LL

## 2016-11-18 NOTE — Telephone Encounter (Signed)
Servando Snare approved  Have resent to local pharmacy [facility]

## 2016-11-18 NOTE — Telephone Encounter (Signed)
Please call patient and schedule a 30 min appointment with Dr. Juleen China to review her chart and make recommendations.  Thanks!  Inda Coke PA-C, 11/18/16

## 2016-11-19 ENCOUNTER — Other Ambulatory Visit: Payer: Self-pay | Admitting: Neurology

## 2016-11-19 ENCOUNTER — Ambulatory Visit: Payer: BLUE CROSS/BLUE SHIELD | Admitting: Physician Assistant

## 2016-11-19 ENCOUNTER — Ambulatory Visit: Payer: BLUE CROSS/BLUE SHIELD | Admitting: Family Medicine

## 2016-11-22 ENCOUNTER — Ambulatory Visit (INDEPENDENT_AMBULATORY_CARE_PROVIDER_SITE_OTHER): Payer: BLUE CROSS/BLUE SHIELD | Admitting: Family Medicine

## 2016-11-22 ENCOUNTER — Encounter: Payer: Self-pay | Admitting: Family Medicine

## 2016-11-22 VITALS — BP 108/64 | HR 80 | Temp 98.7°F | Ht 65.0 in | Wt 167.2 lb

## 2016-11-22 DIAGNOSIS — M25551 Pain in right hip: Secondary | ICD-10-CM | POA: Diagnosis not present

## 2016-11-22 DIAGNOSIS — K3184 Gastroparesis: Secondary | ICD-10-CM | POA: Diagnosis not present

## 2016-11-22 DIAGNOSIS — F331 Major depressive disorder, recurrent, moderate: Secondary | ICD-10-CM | POA: Diagnosis not present

## 2016-11-22 DIAGNOSIS — R634 Abnormal weight loss: Secondary | ICD-10-CM

## 2016-11-22 DIAGNOSIS — G8929 Other chronic pain: Secondary | ICD-10-CM

## 2016-11-22 DIAGNOSIS — M5417 Radiculopathy, lumbosacral region: Secondary | ICD-10-CM

## 2016-11-22 NOTE — Progress Notes (Signed)
[name] White is a 51 y.o. female is here for follow up.  History of Present Illness:   Water quality scientist, CMA, acting as scribe for Dr. Juleen White.  HPI:  Patient comes in today to discuss multiple health concerns.   She states she began having weight loss in October-December of 2017.  She states that everything she eats causes nausea.  She has had multiple studies for her GI issues with no clear reason for her symptoms.  She states that recently she was told that her back may be causing her issues with her stomach.  She also complains of severe back pain.  She states that she has pain from her thoracic pain all the way down to her lumbar spine.  She states she has fallen on ice several times before and has not gone to the doctor.  States she has history of breaking her coccyx after a fall.  This occurred in December 2014.    Two weeks ago she had a steroid injection in her lower back and had 4 injections in the facet joints in her lower back.  She also sees an orthopedist and a Restaurant manager, fast food.  Sometimes has dizziness.  Patient has an appointment today with a neurosurgeon and had to abruptly leave the appointment in order to keep that appointment.  REVIEW OF CHART: Migraines: Topiramate 150 mg po daily for prevention. Imitrex with Naproxen for abortive therapy. Prescribed by Dr. Tomi White.    Health Maintenance Due  Topic Date Due  . INFLUENZA VACCINE  09/22/2016   Depression screen [facility] 2/9 11/22/2016 03/11/2015 10/08/2014  Decreased Interest 2 3 3   Down, Depressed, Hopeless 2 2 2   PHQ - 2 Score 4 5 5   Altered sleeping 3 - 2  Tired, decreased energy 3 - 2  Change in appetite 3 - 0  Feeling bad or failure about yourself  0 - 1  Trouble concentrating 0 - 1  Moving slowly or fidgety/restless 0 - 3  Suicidal thoughts 0 - 0  PHQ-9 Score 13 - 14  Difficult doing work/chores Somewhat difficult - Somewhat difficult  Some recent data might be hidden   PMHx, SurgHx, SocialHx, FamHx, Medications,  and Allergies were reviewed in the Visit Navigator and updated as appropriate.   Patient Active Problem List   Diagnosis Date Noted  . Chronic pansinusitis 08/26/2016  . Weight loss 05/20/2016  . Helicobacter pylori ab+ 11/27/2015  . Personal history of colonic polyps 05/04/2015  . Dizziness 05/04/2015  . 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 Chronic right hip pain 04/16/2014  . Musculoskeletal chest pain 09/25/2013  . Chronic pelvic pain in female 07/06/2012  . Cystocele 07/06/2012  . Rectocele 07/06/2012  . Excessive sleepiness 01/30/2012  . OSA (obstructive sleep apnea)   . Depression   . Right lumbosacral radiculopathy 07/10/2010  . Dyslipidemia 05/05/2009  . BARRETTS ESOPHAGUS 10/04/2008  . HEADACHE, CHRONIC 09/28/2007   Social History  Substance Use Topics  . Smoking status: Former Smoker    Packs/day: 0.50    Years: 30.00    Types: Cigarettes    Quit date: 06/21/2015  . Smokeless tobacco: Never Used  . Alcohol use 0.0 oz/week     Comment: 2 x a month   Current Medications and Allergies:   .  ALPRAZolam (XANAX) 1 MG tablet, TAKE 1/2 TO 1 (ONE-HALF TO ONE) TABLET BY MOUTH AT BEDTIME AS NEEDED, Disp: 30 tablet, Rfl: 1 .  cyclobenzaprine (FLEXERIL) 10 MG  tablet, , Disp: , Rfl: 0 .  lidocaine (XYLOCAINE) 2 % solution, Use as directed 20 mLs in the mouth or throat as needed for mouth pain., Disp: 200 mL, Rfl: 2 .  LINZESS 290 MCG CAPS capsule, daily before breakfast. , Disp: , Rfl: 0 .  lubiprostone (AMITIZA) 24 MCG capsule, Take 1 capsule (24 mcg total) by mouth 2 (two) times daily with a meal., Disp: 60 capsule, Rfl: 11 .  meclizine (ANTIVERT) 25 MG tablet, TAKE ONE TABLET BY MOUTH THREE TIMES DAILY AS NEEDED FOR DIZZINESS, Disp: 60 tablet, Rfl: 0 .  naproxen (NAPROSYN) 500 MG tablet, Take 1 tablet (500 mg total) by mouth every 12 (twelve) hours as needed., Disp: 16 tablet, Rfl: 5 .  ondansetron (ZOFRAN) 4 MG  tablet, TAKE ONE TABLET BY MOUTH EVERY 8 HOURS AS NEEDED FOR NAUSEA OR VOMITING .  pantoprazole (PROTONIX) 40 MG tablet, Take 1 tablet (40 mg total) by mouth daily., Disp: 90 tablet, Rfl: 3 .  ranitidine (ZANTAC) 150 MG tablet, Take 1 tablet (150 mg total) by mouth at bedtime., Disp: 30 tablet, Rfl: 3 .  rOPINIRole (REQUIP) 1 MG tablet, TAKE 1 TO 2 TABLETS BY MOUTH AT BEDTIME, Disp: 60 tablet, Rfl: 3 .  SUMAtriptan (IMITREX) 100 MG tablet, TAKE 1 AT THE EARLIEST ONSET OF HEADACHE.  Marland Kitchen  topiramate (TOPAMAX) 50 MG tablet, TAKE 3 TABLETS BY MOUTH AT BEDTIME, Disp: 270 tablet, Rfl: 1 .  traMADol (ULTRAM) 50 MG tablet, , Disp: , Rfl: 0  Allergies  Allergen Reactions  . Morphine And Related Hives  . Penicillins Hives   Review of Systems   Pertinent items are noted in the HPI. Otherwise, ROS is negative.  Vitals:   Vitals:   11/22/16 1318  BP: 108/64  Pulse: 80  Temp: 98.7 F (37.1 C)  TempSrc: Oral  SpO2: 99%  Weight: 167 lb 3.2 oz (75.8 kg)  Height: 5\' 5"  (1.651 m)     Body mass index is 27.82 kg/m.   Physical Exam:   Physical Exam  Constitutional: She appears well-nourished.  HENT:  Head: Normocephalic and atraumatic.  Eyes: Pupils are equal, round, and reactive to light. EOM are normal.  Neck: Normal range of motion. Neck supple.  Cardiovascular: Normal rate, regular rhythm, normal heart sounds and intact distal pulses.   Pulmonary/Chest: Effort normal.  Abdominal: Soft.  Skin: Skin is warm.  Psychiatric: She has a normal mood and affect. Her behavior is normal.  Nursing note and vitals reviewed.   Assessment and Plan:   [name] was seen today for follow-up.  Diagnoses and all orders for this visit:  Moderate episode of recurrent major depressive disorder ([place])  Weight loss  Gastroparesis  Right lumbosacral radiculopathy  Chronic right hip pain   The patient will be seeing Neurosurgery today. Will see how that goes. I will review the chart. My first  concern is the high dose of Topamax.   . Reviewed expectations re: course of current medical issues. . Discussed self-management of symptoms. . Outlined signs and symptoms indicating need for more acute intervention. . Patient verbalized understanding and all questions were answered. Marland Kitchen Health Maintenance issues including appropriate healthy diet, exercise, and smoking avoidance were discussed with patient. . See orders for this visit as documented in the electronic medical record. . Patient received an After Visit Summary.  CMA served as Education administrator during this visit. History, Physical, and Plan performed by medical provider. The above documentation has been reviewed and is accurate and complete.  Briscoe Deutscher, D.O.  Briscoe Deutscher, DO [place] [place] 11/22/2016  Future Appointments Date Time Provider [place]  01/04/2017 2:30 PM Mauri Pole, MD [facility]  03/29/2017 11:30 AM Pieter Partridge, DO [facility] None

## 2016-11-23 NOTE — Telephone Encounter (Signed)
Done

## 2016-12-06 ENCOUNTER — Other Ambulatory Visit: Payer: Self-pay | Admitting: Neurology

## 2016-12-07 ENCOUNTER — Encounter: Payer: Self-pay | Admitting: Physician Assistant

## 2016-12-07 ENCOUNTER — Ambulatory Visit (INDEPENDENT_AMBULATORY_CARE_PROVIDER_SITE_OTHER): Payer: BLUE CROSS/BLUE SHIELD | Admitting: Physician Assistant

## 2016-12-07 ENCOUNTER — Encounter: Payer: Self-pay | Admitting: Family Medicine

## 2016-12-07 VITALS — BP 110/64 | HR 59 | Temp 98.6°F | Resp 16 | Wt 169.8 lb

## 2016-12-07 DIAGNOSIS — R197 Diarrhea, unspecified: Secondary | ICD-10-CM

## 2016-12-07 DIAGNOSIS — R1013 Epigastric pain: Secondary | ICD-10-CM | POA: Diagnosis not present

## 2016-12-07 DIAGNOSIS — R42 Dizziness and giddiness: Secondary | ICD-10-CM | POA: Diagnosis not present

## 2016-12-07 DIAGNOSIS — G43809 Other migraine, not intractable, without status migrainosus: Secondary | ICD-10-CM

## 2016-12-07 LAB — POCT URINALYSIS DIPSTICK
BILIRUBIN UA: NEGATIVE
Blood, UA: NEGATIVE
Glucose, UA: NEGATIVE
KETONES UA: NEGATIVE
LEUKOCYTES UA: NEGATIVE
NITRITE UA: NEGATIVE
PH UA: 6 (ref 5.0–8.0)
Protein, UA: NEGATIVE
Spec Grav, UA: 1.02 (ref 1.010–1.025)
Urobilinogen, UA: 0.2 E.U./dL

## 2016-12-07 LAB — CBC WITH DIFFERENTIAL/PLATELET
BASOS ABS: 0.1 10*3/uL (ref 0.0–0.1)
Basophils Relative: 0.8 % (ref 0.0–3.0)
EOS PCT: 1.2 % (ref 0.0–5.0)
Eosinophils Absolute: 0.1 10*3/uL (ref 0.0–0.7)
HEMATOCRIT: 39.9 % (ref 36.0–46.0)
HEMOGLOBIN: 13 g/dL (ref 12.0–15.0)
LYMPHS PCT: 26.7 % (ref 12.0–46.0)
Lymphs Abs: 1.8 10*3/uL (ref 0.7–4.0)
MCHC: 32.7 g/dL (ref 30.0–36.0)
MCV: 100.1 fl — AB (ref 78.0–100.0)
MONOS PCT: 8.1 % (ref 3.0–12.0)
Monocytes Absolute: 0.5 10*3/uL (ref 0.1–1.0)
Neutro Abs: 4.1 10*3/uL (ref 1.4–7.7)
Neutrophils Relative %: 63.2 % (ref 43.0–77.0)
Platelets: 178 10*3/uL (ref 150.0–400.0)
RBC: 3.98 Mil/uL (ref 3.87–5.11)
RDW: 13.7 % (ref 11.5–15.5)
WBC: 6.6 10*3/uL (ref 4.0–10.5)

## 2016-12-07 LAB — BASIC METABOLIC PANEL
BUN: 17 mg/dL (ref 6–23)
CALCIUM: 9.4 mg/dL (ref 8.4–10.5)
CO2: 27 mEq/L (ref 19–32)
Chloride: 107 mEq/L (ref 96–112)
Creatinine, Ser: 0.74 mg/dL (ref 0.40–1.20)
GFR: 87.55 mL/min (ref >60.00)
GLUCOSE: 81 mg/dL (ref 70–99)
POTASSIUM: 4.6 meq/L (ref 3.5–5.1)
Sodium: 138 mEq/L (ref 135–145)

## 2016-12-07 LAB — MAGNESIUM: MAGNESIUM: 2 mg/dL (ref 1.5–2.5)

## 2016-12-07 MED ORDER — KETOROLAC TROMETHAMINE 60 MG/2ML IM SOLN
60.0000 mg | Freq: Once | INTRAMUSCULAR | Status: AC
  Administered 2016-12-07: 60 mg via INTRAMUSCULAR

## 2016-12-07 MED ORDER — PROMETHAZINE HCL 12.5 MG PO TABS: 12.5000 mg | ORAL_TABLET | Freq: Four times a day (QID) | ORAL | 0 refills | Status: DC | PRN

## 2016-12-07 MED ORDER — KETOROLAC TROMETHAMINE 10 MG PO TABS: 10.0000 mg | ORAL_TABLET | Freq: Four times a day (QID) | ORAL | 0 refills | Status: DC | PRN

## 2016-12-07 NOTE — Progress Notes (Signed)
[name] White is a 51 y.o. female here for a new problem.  History of Present Illness:   Chief Complaint  Patient presents with  . Diarrhea    started 12/06/16  . Headache    started 12/05/16. Took migraine medication this morning and yesterday.  . Nausea    Chronic    HPI   Diarrhea -- Martin Majestic out on Sunday to Mother [place] and has had pretty constant diarrhea since. Took Pepto-Bismol on Sunday and it resolved, however it returned yesterday. She does have a history of chronic diarrhea. She is working on pushing her fluids, has a large water bottle that she carries around with her daily. No urinary symptoms, doesn't feel like she has a UTI. No blood in her diarrhea that she has seen. Prior to Sunday she reports that she was constipated. Linzess has been stopped by her GI doctor >1 month ago. Middle abdominal pain associated with diarrhea --- no on Zantac anymore, does continue on Protonix. She has been off of her Zantac for a few weeks. Appetite variable. Weight is stabilizing, does state that she is "eating foods that I'm not supposed to" and gives me an example of [place]bbies.  Wt Readings from Last 3 Encounters:  12/07/16 169 lb 12.8 oz (77 kg)  11/22/16 167 lb 3.2 oz (75.8 kg)  11/08/16 166 lb 9.6 oz (75.6 kg)   Migraine -- started yesterday. Took Imitrex at 6pm yesterday. When she woke up this morning, headache persisted. When she got up out of bed, she felt dizzy, and she took another Imitrex, Meclizine and zofran. Denies slurred speech. States that this feels like a typical migraine for her. Phenergan typically helps with her nausea and to sleep during her migraine. Denies vision changes.   Past Medical History:  Diagnosis Date  . Anterolisthesis   . Anxiety    per pt, has panic attacks  . Arthritis    in back per pt.  . Barrett's esophagus    egd 2007 + 2009 but none 2011 or 08/2010 egd  . Chronic headache   . Colon polyp    hyperplastic  . Depression    hx of suicide  attempt in 2003  . Grief reaction   . Hidradenitis suppurativa   . IBS (irritable bowel syndrome)   . Migraine   . OSA (obstructive sleep apnea)   . Ovarian cyst   . Restless leg   . Tobacco abuse      Social History   Social History  . Marital status: Divorced    Spouse name: N/A  . Number of children: 2  . Years of education: N/A   Occupational History  . Bartender    Social History Main Topics  . Smoking status: Former Smoker    Packs/day: 0.50    Years: 30.00    Types: Cigarettes    Quit date: 06/21/2015  . Smokeless tobacco: Never Used  . Alcohol use 0.0 oz/week     Comment: 2 x a month  . Drug use: No  . Sexual activity: Not on file   Other Topics Concern  . Not on file   Social History Narrative   Occupation: bartender   Divorced  -- currently single   Alcohol use-yes -x 5 per weekend   No longer a smoker    Past Surgical History:  Procedure Laterality Date  . ABDOMINAL HYSTERECTOMY     secondary to endometriosis  . CESAREAN SECTION     x 2  .  CHOLECYSTECTOMY    . COLONOSCOPY W/ POLYPECTOMY  2012  . ELBOW SURGERY     left  . LAPAROSCOPY  04-22-10   lysis of adhesions  . LAPAROSCOPY  04-30-10    with mini laparotomy and cystotomy repair  . plantar fascitis     right foot  . SALPINGECTOMY  04-22-10   right  . TUBAL LIGATION    . WRIST SURGERY     right/ganglion cyst    Family History  Problem Relation Age of Onset  . COPD Mother   . Stroke Mother   . Coronary artery disease Mother        pacer  . Colon polyps Mother   . Breast cancer Mother   . Migraines Mother   . Other Mother        RSDS  . Rheum arthritis Mother   . Hypertension Father   . Colon polyps Brother   . Colon cancer Maternal Grandfather   . Colon polyps Maternal Grandfather   . Breast cancer Maternal Aunt   . Uterine cancer Cousin        x 2  . Aneurysm Maternal Grandmother   . Kidney disease Brother   . Other Brother        back pain  . Kidney cancer Paternal  Aunt   . Stomach cancer Neg Hx     Allergies  Allergen Reactions  . Morphine And Related Hives  . Penicillins Hives         Current Medications:   Current Outpatient Prescriptions:  .  ALPRAZolam (XANAX) 1 MG tablet, TAKE 1/2 TO 1 (ONE-HALF TO ONE) TABLET BY MOUTH AT BEDTIME AS NEEDED, Disp: 30 tablet, Rfl: 1 .  cyclobenzaprine (FLEXERIL) 10 MG tablet, , Disp: , Rfl: 0 .  ketorolac (TORADOL) 10 MG tablet, Take 1 tablet (10 mg total) by mouth every 6 (six) hours as needed., Disp: 20 tablet, Rfl: 0 .  lidocaine (XYLOCAINE) 2 % solution, Use as directed 20 mLs in the mouth or throat as needed for mouth pain., Disp: 200 mL, Rfl: 2 .  lubiprostone (AMITIZA) 24 MCG capsule, Take 1 capsule (24 mcg total) by mouth 2 (two) times daily with a meal., Disp: 60 capsule, Rfl: 11 .  meclizine (ANTIVERT) 25 MG tablet, TAKE ONE TABLET BY MOUTH THREE TIMES DAILY AS NEEDED FOR DIZZINESS, Disp: 60 tablet, Rfl: 0 .  ondansetron (ZOFRAN) 4 MG tablet, TAKE ONE TABLET BY MOUTH EVERY 8 HOURS AS NEEDED FOR NAUSEA OR VOMITING, Disp: 30 tablet, Rfl: 11 .  pantoprazole (PROTONIX) 40 MG tablet, Take 1 tablet (40 mg total) by mouth daily., Disp: 90 tablet, Rfl: 3 .  promethazine (PHENERGAN) 12.5 MG tablet, Take 1 tablet (12.5 mg total) by mouth every 6 (six) hours as needed for nausea or vomiting., Disp: 30 tablet, Rfl: 0 .  rOPINIRole (REQUIP) 1 MG tablet, TAKE 1 TO 2 TABLETS BY MOUTH AT BEDTIME, Disp: 60 tablet, Rfl: 3 .  SUMAtriptan (IMITREX) 100 MG tablet, TAKE 1 AT THE EARLIEST ONSET OF HEADACHE. MAY REPEAT ONCE IN 2 HOURS IF HEADACHE PERSISTS OR RECURS. DO NOT EXCEED 2 TABLETS IN 24 HOURS, Disp: 10 tablet, Rfl: 2 .  SUMAtriptan (IMITREX) 100 MG tablet, TAKE 1 AT THE EARLIEST ONSET OF HEADACHE. MAY REPEAT ONCE IN 2 HOURS IF HEADACHE PERSISTS OR RECURS. DO NOT EXCEED 2 TABLETS IN 24 HOURS, Disp: 5 tablet, Rfl: 5 .  SUMAtriptan (IMITREX) 100 MG tablet, TAKE 1 AT THE EARLIEST ONSET OF HEADACHE. MAY  REPEAT ONCE IN 2  HOURS IF HEADACHE PERSISTS OR RECURS. DO NOT EXCEED 2 TABLETS IN 24 HOURS, Disp: 5 tablet, Rfl: 4 .  topiramate (TOPAMAX) 50 MG tablet, TAKE 3 TABLETS BY MOUTH AT BEDTIME, Disp: 270 tablet, Rfl: 1 .  traMADol (ULTRAM) 50 MG tablet, , Disp: , Rfl: 0  Current Facility-Administered Medications:  .  0.9 %  sodium chloride infusion, 500 mL, Intravenous, Continuous, Nandigam, Kavitha V, MD   Review of Systems:   ROS  Negative unless otherwise stated in HPI.  Vitals:   Vitals:   12/07/16 1314  BP: 110/64  Pulse: (!) 59  Resp: 16  Temp: 98.6 F (37 C)  TempSrc: Oral  SpO2: 93%  Weight: 169 lb 12.8 oz (77 kg)     Body mass index is 28.26 kg/m.   Orthostatic VS for the past 24 hrs (Last 3 readings):  BP- Lying Pulse- Lying BP- Sitting Pulse- Sitting BP- Standing at 0 minutes Pulse- Standing at 0 minutes  12/07/16 1400 120/80 70 110/80 76 112/80 81    Physical Exam:   Physical Exam  Constitutional: She appears well-developed. She is cooperative.  Non-toxic appearance. She does not have a sickly appearance. She does not appear ill. No distress.  HENT:  Head: Normocephalic and atraumatic. Head is without laceration, without right periorbital erythema and without left periorbital erythema.  Nose: Nose normal. Right sinus exhibits no maxillary sinus tenderness and no frontal sinus tenderness. Left sinus exhibits no maxillary sinus tenderness and no frontal sinus tenderness.  Mouth/Throat: Uvula is midline and mucous membranes are normal.  Eyes: Pupils are equal, round, and reactive to light. Conjunctivae, EOM and lids are normal.  Neck: Trachea normal.  Cardiovascular: Normal rate, regular rhythm, S1 normal, S2 normal and normal heart sounds.   Pulmonary/Chest: Effort normal and breath sounds normal. She has no decreased breath sounds. She has no wheezes. She has no rhonchi. She has no rales.  Abdominal: Normal appearance and bowel sounds are normal. There is generalized tenderness and  tenderness in the epigastric area. There is no rigidity, no rebound, no guarding, no tenderness at McBurney's point and negative Murphy's sign.  Lymphadenopathy:    She has no cervical adenopathy.  Neurological: She is alert. She has normal strength. No cranial nerve deficit or sensory deficit. Coordination and gait normal. GCS eye subscore is 4. GCS verbal subscore is 5. GCS motor subscore is 6.  Reflex Scores:      Patellar reflexes are 2+ on the right side and 2+ on the left side. Skin: Skin is warm, dry and intact.  Psychiatric: She has a normal mood and affect. Her speech is normal and behavior is normal.  Nursing note and vitals reviewed.  Results for orders placed or performed in visit on 12/07/16  POCT urinalysis dipstick  Result Value Ref Range   Color, UA Yellow    Clarity, UA Clear    Glucose, UA Negative    Bilirubin, UA Negative    Ketones, UA Negative    Spec Grav, UA 1.020 1.010 - 1.025   Blood, UA Negative    pH, UA 6.0 5.0 - 8.0   Protein, UA Negative    Urobilinogen, UA 0.2 0.2 or 1.0 E.U./dL   Nitrite, UA Negative    Leukocytes, UA Negative Negative     Assessment and Plan:    Tashayla was seen today for diarrhea, headache and nausea.  Diagnoses and all orders for this visit:  Epigastric pain, Dizziness, Diarrhea, unspecified  type, Other migraine without status migrainosus, not intractable Received GI cocktail in office today. She was agreeable to Toradol injection, and we discussed use of oral Toradol starting tomorrow if pain persists. Provided script for phenergan to help with migraine symptoms and for sleeping. No red flags on exam -- neuro exam benign. Will also obtain labwork. UA unremarkable. Patient has a chronic history of diarrhea, nausea and migraines, unfortunately she is dealing with all of this at once. Discussed symptomatic care, follow-up if symptoms persist despite treatment.  -     Magnesium -     Basic metabolic panel -     CBC with  Differential/Platelet -     POCT urinalysis dipstick -     ketorolac (TORADOL) injection 60 mg; Inject 2 mLs (60 mg total) into the muscle once.  Other orders -     ketorolac (TORADOL) 10 MG tablet; Take 1 tablet (10 mg total) by mouth every 6 (six) hours as needed. -     promethazine (PHENERGAN) 12.5 MG tablet; Take 1 tablet (12.5 mg total) by mouth every 6 (six) hours as needed for nausea or vomiting.    . Reviewed expectations re: course of current medical issues. . Discussed self-management of symptoms. . Outlined signs and symptoms indicating need for more acute intervention. . Patient verbalized understanding and all questions were answered. . See orders for this visit as documented in the electronic medical record. . Patient received an After-Visit Summary.  CMA or LPN served as scribe during this visit. History, Physical, and Plan performed by medical provider. Documentation and orders reviewed and attested to.  Inda Coke, PA-C

## 2016-12-07 NOTE — Telephone Encounter (Signed)
Patient is seeing Inda Coke, PA today at 1p for diarrhea and a headache.

## 2016-12-07 NOTE — Patient Instructions (Signed)
May take oral toradol tomorrow if pain persists. Take phenergan to help with sleeping and nausea.  We will call with lab results.  If abdominal or migraine pain worsens in any way, please go to the ER.    Follow these instructions at home --> MIGRAINE Medicines  Take over-the-counter and prescription medicines only as told by your health care provider.  Do not drive or use heavy machinery while taking prescription pain medicine.  To prevent or treat constipation while you are taking prescription pain medicine, your health care provider may recommend that you: ? Drink enough fluid to keep your urine clear or pale yellow. ? Take over-the-counter or prescription medicines. ? Eat foods that are high in fiber, such as fresh fruits and vegetables, whole grains, and beans. ? Limit foods that are high in fat and processed sugars, such as fried and sweet foods. Lifestyle  Avoid alcohol use.  Do not use any products that contain nicotine or tobacco, such as cigarettes and e-cigarettes. If you need help quitting, ask your health care provider.  Get at least 8 hours of sleep every night.  Limit your stress. General instructions   Keep a journal to find out what may trigger your migraine headaches. For example, write down: ? What you eat and drink. ? How much sleep you get. ? Any change to your diet or medicines.  If you have a migraine: ? Avoid things that make your symptoms worse, such as bright lights. ? It may help to lie down in a dark, quiet room. ? Do not drive or use heavy machinery. ? Ask your health care provider what activities are safe for you while you are experiencing symptoms.  Keep all follow-up visits as told by your health care provider. This is important. Contact a health care provider if:  You develop symptoms that are different or more severe than your usual migraine symptoms. Get help right away if:  Your migraine becomes severe.  You have a fever.  You  have a stiff neck.  You have vision loss.  Your muscles feel weak or like you cannot control them.  You start to lose your balance often.  You develop trouble walking.  You faint. This information is not intended to replace advice given to you by your health care provider. Make sure you discuss any questions you have with your health care provider. Document Released: 02/08/2005 Document Revised: 08/29/2015 Document Reviewed: 07/28/2015 Elsevier Interactive Patient Education  2017 [employer].

## 2016-12-13 ENCOUNTER — Other Ambulatory Visit: Payer: Self-pay | Admitting: Family

## 2016-12-27 ENCOUNTER — Telehealth: Payer: Self-pay | Admitting: Pulmonary Disease

## 2016-12-27 NOTE — Telephone Encounter (Signed)
Spoke with the pt  She states needing refill on Requip  Was last seen 12/22/16  She states did not think she ever needed to f/u and wants to make sure VS needs to see her before refilling her med.  I advised we need to see her once per year in order to refill her meds She did not want to scheduled appt  Please advise on rf thanks

## 2016-12-28 ENCOUNTER — Other Ambulatory Visit: Payer: Self-pay

## 2016-12-28 ENCOUNTER — Telehealth: Payer: Self-pay

## 2016-12-28 MED ORDER — ROPINIROLE HCL 1 MG PO TABS: 1.0000 mg | ORAL_TABLET | Freq: Every day | ORAL | 3 refills | Status: DC

## 2016-12-28 NOTE — Telephone Encounter (Signed)
Okay refill. 

## 2016-12-28 NOTE — Telephone Encounter (Signed)
Received refill request for Requip 1 mg tablet 1-2 tablet po qhs for this patient.  You have not previously prescribed this.  Please advise if ok to refill.  Thanks!

## 2016-12-28 NOTE — Telephone Encounter (Signed)
Refill has been sent to patient's pharmacy. 

## 2016-12-28 NOTE — Telephone Encounter (Signed)
I have updated patient's PCP, she is actually a [facility] patient now. I know that [facility] China is working on medication management and possibly de-prescribing medications for this patient, so I would be most comfortable for her to decide to refill this medication.  Inda Coke PA-C, 12/28/16

## 2016-12-30 NOTE — Telephone Encounter (Signed)
Okay to send refill for requip.  She needs to schedule ROV with me to discuss status of OSA and restless legs.

## 2016-12-30 NOTE — Telephone Encounter (Signed)
Called in Rx to [facility] and pt notified. She made an appt on 01/28/2017 at 4pm. Nothing further is needed.

## 2017-01-04 ENCOUNTER — Ambulatory Visit: Payer: BLUE CROSS/BLUE SHIELD | Admitting: Gastroenterology

## 2017-01-17 ENCOUNTER — Encounter: Payer: BLUE CROSS/BLUE SHIELD | Attending: Physical Medicine & Rehabilitation

## 2017-01-17 ENCOUNTER — Other Ambulatory Visit: Payer: Self-pay

## 2017-01-17 ENCOUNTER — Ambulatory Visit: Payer: BLUE CROSS/BLUE SHIELD | Admitting: Physical Medicine & Rehabilitation

## 2017-01-17 ENCOUNTER — Other Ambulatory Visit: Payer: Self-pay | Admitting: Physician Assistant

## 2017-01-17 ENCOUNTER — Encounter: Payer: Self-pay | Admitting: Physical Medicine & Rehabilitation

## 2017-01-17 VITALS — BP 122/84 | HR 72

## 2017-01-17 DIAGNOSIS — Z9851 Tubal ligation status: Secondary | ICD-10-CM | POA: Insufficient documentation

## 2017-01-17 DIAGNOSIS — F419 Anxiety disorder, unspecified: Secondary | ICD-10-CM | POA: Diagnosis not present

## 2017-01-17 DIAGNOSIS — G8929 Other chronic pain: Secondary | ICD-10-CM

## 2017-01-17 DIAGNOSIS — M25551 Pain in right hip: Secondary | ICD-10-CM | POA: Diagnosis not present

## 2017-01-17 DIAGNOSIS — Z803 Family history of malignant neoplasm of breast: Secondary | ICD-10-CM | POA: Insufficient documentation

## 2017-01-17 DIAGNOSIS — Z9889 Other specified postprocedural states: Secondary | ICD-10-CM | POA: Diagnosis not present

## 2017-01-17 DIAGNOSIS — G5791 Unspecified mononeuropathy of right lower limb: Secondary | ICD-10-CM

## 2017-01-17 DIAGNOSIS — Z87891 Personal history of nicotine dependence: Secondary | ICD-10-CM | POA: Insufficient documentation

## 2017-01-17 DIAGNOSIS — G5780 Other specified mononeuropathies of unspecified lower limb: Secondary | ICD-10-CM

## 2017-01-17 DIAGNOSIS — Z90710 Acquired absence of both cervix and uterus: Secondary | ICD-10-CM | POA: Diagnosis not present

## 2017-01-17 DIAGNOSIS — G588 Other specified mononeuropathies: Secondary | ICD-10-CM

## 2017-01-17 DIAGNOSIS — Z8249 Family history of ischemic heart disease and other diseases of the circulatory system: Secondary | ICD-10-CM | POA: Diagnosis not present

## 2017-01-17 DIAGNOSIS — Z808 Family history of malignant neoplasm of other organs or systems: Secondary | ICD-10-CM | POA: Insufficient documentation

## 2017-01-17 DIAGNOSIS — Z823 Family history of stroke: Secondary | ICD-10-CM | POA: Insufficient documentation

## 2017-01-17 NOTE — Telephone Encounter (Signed)
Roselyn Reef, Dr. Juleen China is managing this pt now.

## 2017-01-17 NOTE — Telephone Encounter (Signed)
Okay refill. Make sure she has an upcoming appt.

## 2017-01-17 NOTE — Progress Notes (Signed)
Subjective:    Patient ID: [name] White, female    DOB: 29-Feb-1965, 51 y.o.   MRN: [id number]  HPI  Hx of Right iliohypogastric neuritis  Onset was after gynecologic surgery.  She has undergone orthopedic workup for right hip pain including x-rays and MRI of the hip.  No significant hip pathology was identified. She had good relief with right iliohypogastric nerve blocks as well as cryoablation.  Her last procedure was performed almost 2 years ago. In the interval time she is undergone spine surgery evaluation for low back pain.  Recommendations were for lumbar fusion L4-5 L5-S1. Entire leg has gone numb on the right side when she drives No relief from Lumbar ESI  Told she has both spondylolisthesis, and facet arthropathy  Last visit Jan 2017 [place] in March 2018 at home Pain Inventory Average Pain 8 Pain Right Now 8 My pain is constant, burning, dull, tingling and aching  In the last 24 hours, has pain interfered with the following? General activity 10 Relation with others 10 Enjoyment of life 10 What TIME of day is your pain at its worst? all Sleep (in general) Fair  Pain is worse with: walking, bending, sitting, standing and some activites Pain improves with: rest and pacing activities Relief from Meds: 4  Mobility walk without assistance use a cane how many minutes can you walk? 10 ability to climb steps?  no do you drive?  yes Do you have any goals in this area?  yes  Function employed # of hrs/week 30 I need assistance with the following:  household duties  Neuro/Psych bowel control problems weakness numbness tingling trouble walking spasms  Prior Studies Any changes since last visit?  yes  Physicians involved in your care Any changes since last visit?  no   Family History  Problem Relation Age of Onset  . COPD Mother   . Stroke Mother   . Coronary artery disease Mother        pacer  . Colon polyps Mother   . Breast cancer Mother   .  Migraines Mother   . Other Mother        RSDS  . Rheum arthritis Mother   . Hypertension Father   . Colon polyps Brother   . Colon cancer Maternal Grandfather   . Colon polyps Maternal Grandfather   . Breast cancer Maternal Aunt   . Uterine cancer Cousin        x 2  . Aneurysm Maternal Grandmother   . Kidney disease Brother   . Other Brother        back pain  . Kidney cancer Paternal Aunt   . Stomach cancer Neg Hx    Social History   Socioeconomic History  . Marital status: Divorced    Spouse name: Not on file  . Number of children: 2  . Years of education: Not on file  . Highest education level: Not on file  Social Needs  . Financial resource strain: Not on file  . Food insecurity - worry: Not on file  . Food insecurity - inability: Not on file  . Transportation needs - medical: Not on file  . Transportation needs - non-medical: Not on file  Occupational History  . Occupation: Bartender  Tobacco Use  . Smoking status: Former Smoker    Packs/day: 0.50    Years: 30.00    Pack years: 15.00    Types: Cigarettes    Last attempt to quit: 06/21/2015  Years since quitting: 1.5  . Smokeless tobacco: Never Used  Substance and Sexual Activity  . Alcohol use: Yes    Alcohol/week: 0.0 oz    Comment: 2 x a month  . Drug use: No  . Sexual activity: Not on file  Other Topics Concern  . Not on file  Social History Narrative   Occupation: bartender   Divorced  -- currently single   Alcohol use-yes -x 5 per weekend   No longer a smoker   Past Surgical History:  Procedure Laterality Date  . ABDOMINAL HYSTERECTOMY     secondary to endometriosis  . CESAREAN SECTION     x 2  . CHOLECYSTECTOMY    . COLONOSCOPY W/ POLYPECTOMY  2012  . ELBOW SURGERY     left  . LAPAROSCOPY  04-22-10   lysis of adhesions  . LAPAROSCOPY  04-30-10    with mini laparotomy and cystotomy repair  . plantar fascitis     right foot  . SALPINGECTOMY  04-22-10   right  . TUBAL LIGATION    . WRIST  SURGERY     right/ganglion cyst   Past Medical History:  Diagnosis Date  . Anterolisthesis   . Anxiety    per pt, has panic attacks  . Arthritis    in back per pt.  . Barrett's esophagus    egd 2007 + 2009 but none 2011 or 08/2010 egd  . Chronic headache   . Colon polyp    hyperplastic  . Depression    hx of suicide attempt in 2003  . Grief reaction   . Hidradenitis suppurativa   . IBS (irritable bowel syndrome)   . Migraine   . OSA (obstructive sleep apnea)   . Ovarian cyst   . Restless leg   . Tobacco abuse    There were no vitals taken for this visit.  Opioid Risk Score:   Fall Risk Score:  `1  Depression screen PHQ 2/9  Depression screen [facility] 2/9 11/22/2016 03/11/2015 10/08/2014 05/10/2014 08/30/2012  Decreased Interest 2 3 3 3 1   Down, Depressed, Hopeless 2 2 2 3 1   PHQ - 2 Score 4 5 5 6 2   Altered sleeping 3 - 2 2 0  Tired, decreased energy 3 - 2 2 1   Change in appetite 3 - 0 2 0  Feeling bad or failure about yourself  0 - 1 3 0  Trouble concentrating 0 - 1 1 0  Moving slowly or fidgety/restless 0 - 3 2 0  Suicidal thoughts 0 - 0 1 0  PHQ-9 Score 13 - 14 19 3   Difficult doing work/chores Somewhat difficult - Somewhat difficult - -  Some recent data might be hidden      Review of Systems  Constitutional: Positive for unexpected weight change.       Poor appetite  HENT: Negative.   Eyes: Negative.   Respiratory: Positive for apnea.   Cardiovascular: Negative.   Gastrointestinal: Positive for abdominal pain, constipation, diarrhea, nausea and vomiting.  Endocrine: Negative.   Genitourinary: Negative.   Musculoskeletal: Negative.   Skin: Negative.   Allergic/Immunologic: Negative.   Neurological: Negative.   Hematological: Negative.   Psychiatric/Behavioral: Negative.        Objective:   Physical Exam  Constitutional: She is oriented to person, place, and time. She appears well-developed and well-nourished.  HENT:  Head: Normocephalic and  atraumatic.  Eyes: Pupils are equal, round, and reactive to light.  Musculoskeletal:  Pain with light  touch over the right inguinal area as well as over the right lateral hip area. There is pain with passive extension of the right hip.  No pain with internal/external rotation She has normal hip range of motion.  Right knee has no effusion good range of motion.  Ambulates without evidence of toe drag or knee instability No tenderness palpation lumbar paraspinal muscles no pain with lumbar range of motion. Negative straight leg raising  Neurological: She is alert and oriented to person, place, and time.  Psychiatric: She has a normal mood and affect.  Nursing note and vitals reviewed.         Assessment & Plan:  1.  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  I recommended repeat procedure. She has a follow orthopedic appointment second opinion for right hip pain tomorrow.  I have requested notes of that visit.

## 2017-01-17 NOTE — Patient Instructions (Addendum)
Please ask orthopedic whether your hip pain is related to back or from the joint.

## 2017-01-28 ENCOUNTER — Ambulatory Visit: Payer: BLUE CROSS/BLUE SHIELD | Admitting: Pulmonary Disease

## 2017-01-28 ENCOUNTER — Encounter: Payer: Self-pay | Admitting: Pulmonary Disease

## 2017-01-28 VITALS — BP 112/70 | HR 72 | Ht 66.0 in | Wt 180.6 lb

## 2017-01-28 DIAGNOSIS — Z9989 Dependence on other enabling machines and devices: Secondary | ICD-10-CM | POA: Diagnosis not present

## 2017-01-28 DIAGNOSIS — G2581 Restless legs syndrome: Secondary | ICD-10-CM | POA: Diagnosis not present

## 2017-01-28 DIAGNOSIS — G4733 Obstructive sleep apnea (adult) (pediatric): Secondary | ICD-10-CM

## 2017-01-28 NOTE — Patient Instructions (Signed)
Will arrange for new CPAP mask and supplies  Follow up in 3 months

## 2017-01-28 NOTE — Progress Notes (Signed)
Current Outpatient Medications on File Prior to Visit  Medication Sig  . ALPRAZolam (XANAX) 1 MG tablet TAKE 1/2 TO 1 (ONE-HALF TO ONE) TABLET BY MOUTH AT BEDTIME AS NEEDED  . cyclobenzaprine (FLEXERIL) 10 MG tablet   . ketorolac (TORADOL) 10 MG tablet Take 1 tablet (10 mg total) by mouth every 6 (six) hours as needed.  . lidocaine (XYLOCAINE) 2 % solution Use as directed 20 mLs in the mouth or throat as needed for mouth pain.  Marland Kitchen lubiprostone (AMITIZA) 24 MCG capsule Take 1 capsule (24 mcg total) by mouth 2 (two) times daily with a meal.  . meclizine (ANTIVERT) 25 MG tablet TAKE ONE TABLET BY MOUTH THREE TIMES DAILY AS NEEDED FOR DIZZINESS  . ondansetron (ZOFRAN) 4 MG tablet TAKE ONE TABLET BY MOUTH EVERY 8 HOURS AS NEEDED FOR NAUSEA OR VOMITING  . pantoprazole (PROTONIX) 40 MG tablet Take 1 tablet (40 mg total) by mouth daily.  . promethazine (PHENERGAN) 12.5 MG tablet Take 1 tablet (12.5 mg total) by mouth every 6 (six) hours as needed for nausea or vomiting.  Marland Kitchen rOPINIRole (REQUIP) 1 MG tablet Take 1-2 tablets (1-2 mg total) at bedtime by mouth.  . SUMAtriptan (IMITREX) 100 MG tablet TAKE 1 AT THE EARLIEST ONSET OF HEADACHE. MAY REPEAT ONCE IN 2 HOURS IF HEADACHE PERSISTS OR RECURS. DO NOT EXCEED 2 TABLETS IN 24 HOURS  . SUMAtriptan (IMITREX) 100 MG tablet TAKE 1 AT THE EARLIEST ONSET OF HEADACHE. MAY REPEAT ONCE IN 2 HOURS IF HEADACHE PERSISTS OR RECURS. DO NOT EXCEED 2 TABLETS IN 24 HOURS  . SUMAtriptan (IMITREX) 100 MG tablet TAKE 1 AT THE EARLIEST ONSET OF HEADACHE. MAY REPEAT ONCE IN 2 HOURS IF HEADACHE PERSISTS OR RECURS. DO NOT EXCEED 2 TABLETS IN 24 HOURS  . topiramate (TOPAMAX) 50 MG tablet TAKE 3 TABLETS BY MOUTH AT BEDTIME   Current Facility-Administered Medications on File Prior to Visit  Medication  . 0.9 %  sodium chloride infusion     Chief Complaint  Patient presents with  . Follow-up    Pt is not using machine because she is needing a new mask. Per [name] White they advised pt  is needing new cpap machine, she has had it over 5 years. Pt needs new supplies and new cpap machine. Pt wakes up every 3 hours during the night, not sleeping well.    Sleep tests PSG 10/07/11 >> AHI 17.8, SpO2 low 86%, PLMI 10.2 CPAP 10/26/11 to 11/10/11 >> Used on 12 of 15 nights with average 5 hrs 40 min. Average AHI 3.8 with median CPAP 8 cm H2O and 95 th percentile CPAP 11 cm H2O ONO with CPAP and RA 12/15/11 >> Test time 2 hrs 12 min. Mean SpO2 95%, low SpO2 83%. Spent 16 sec with SpO2 < 88% MSLT with CPAP 01/05/12 >> Mean sleep latency 12 min. Sleep in 5/5 naps, no SOREM's HST 01/14/16 >> AHI 9.2, SaO2 low 82%  Past medical history Anxiety, Barrett's esophagus, HA, depression, Hidradenitis suppurativa, IBS  Past surgical history, Family history, Social history, Allergies all reviewed.  Vital Signs BP 112/70 (BP Location: Left Arm, Cuff Size: Normal)   Pulse 72   Ht 5\' 6"  (1.676 m)   Wt 180 lb 9.6 oz (81.9 kg)   SpO2 100%   BMI 29.15 kg/m   History of Present Illness [name] White is a 51 y.o. female with obstructive sleep apnea and restless leg syndrome.  She needs a new mask.  Her machine isn't  giving same kind of pressure.    She is followed by Dr. Ronnald Ramp for her back.  She is being set up for spinal fusion.  Just waiting on insurance.  She takes requip at 10 pm.  She uses 2 mg nightly.   Physical Exam  General - pleasant Eyes - pupils reactive, wears glasses ENT - no sinus tenderness, no oral exudate, no LAN Cardiac - regular, no murmur Chest - no wheeze, rales Abd - soft, non tender Ext - no edema Skin - no rashes Neuro - normal strength Psych - normal mood    Assessment/Plan  Obstructive sleep apnea. - she has been compliant with CPAP and reports benefit - she needs new machine; her current device is more than 52 yrs old - will arrange for auto CPAP  - will also arrange for new mask and supplies  Restless leg syndrome. - continue requip 2 mg  nightly  Lower back pain. - she is being set up for spinal fusion with Dr. Ronnald Ramp - there are no pulmonary contraindications for her to have surgery - she needs to continue CPAP in the post operative period, and have close monitoring of her oxygen after surgery   Patient Instructions  Will arrange for new CPAP mask and supplies  Follow up in 3 months   Chesley Mires, MD [facility] Pager:  [phone] 01/28/2017, 4:24 PM

## 2017-01-31 ENCOUNTER — Ambulatory Visit: Payer: BLUE CROSS/BLUE SHIELD | Admitting: Physical Medicine & Rehabilitation

## 2017-01-31 DIAGNOSIS — Z87891 Personal history of nicotine dependence: Secondary | ICD-10-CM | POA: Insufficient documentation

## 2017-01-31 DIAGNOSIS — Z9851 Tubal ligation status: Secondary | ICD-10-CM | POA: Insufficient documentation

## 2017-01-31 DIAGNOSIS — Z90710 Acquired absence of both cervix and uterus: Secondary | ICD-10-CM | POA: Insufficient documentation

## 2017-01-31 DIAGNOSIS — Z808 Family history of malignant neoplasm of other organs or systems: Secondary | ICD-10-CM | POA: Insufficient documentation

## 2017-01-31 DIAGNOSIS — G588 Other specified mononeuropathies: Secondary | ICD-10-CM | POA: Insufficient documentation

## 2017-01-31 DIAGNOSIS — Z823 Family history of stroke: Secondary | ICD-10-CM | POA: Insufficient documentation

## 2017-01-31 DIAGNOSIS — Z9889 Other specified postprocedural states: Secondary | ICD-10-CM | POA: Insufficient documentation

## 2017-01-31 DIAGNOSIS — Z8249 Family history of ischemic heart disease and other diseases of the circulatory system: Secondary | ICD-10-CM | POA: Insufficient documentation

## 2017-01-31 DIAGNOSIS — F419 Anxiety disorder, unspecified: Secondary | ICD-10-CM | POA: Insufficient documentation

## 2017-01-31 DIAGNOSIS — Z803 Family history of malignant neoplasm of breast: Secondary | ICD-10-CM | POA: Insufficient documentation

## 2017-02-01 ENCOUNTER — Encounter: Payer: BLUE CROSS/BLUE SHIELD | Attending: Physical Medicine & Rehabilitation

## 2017-02-01 ENCOUNTER — Encounter: Payer: Self-pay | Admitting: Physical Medicine & Rehabilitation

## 2017-02-01 ENCOUNTER — Ambulatory Visit: Payer: BLUE CROSS/BLUE SHIELD | Admitting: Physical Medicine & Rehabilitation

## 2017-02-01 VITALS — BP 100/72 | HR 101

## 2017-02-01 DIAGNOSIS — Z823 Family history of stroke: Secondary | ICD-10-CM | POA: Diagnosis not present

## 2017-02-01 DIAGNOSIS — Z808 Family history of malignant neoplasm of other organs or systems: Secondary | ICD-10-CM | POA: Diagnosis not present

## 2017-02-01 DIAGNOSIS — Z9851 Tubal ligation status: Secondary | ICD-10-CM | POA: Diagnosis not present

## 2017-02-01 DIAGNOSIS — F419 Anxiety disorder, unspecified: Secondary | ICD-10-CM | POA: Diagnosis not present

## 2017-02-01 DIAGNOSIS — G588 Other specified mononeuropathies: Secondary | ICD-10-CM | POA: Diagnosis not present

## 2017-02-01 DIAGNOSIS — Z90710 Acquired absence of both cervix and uterus: Secondary | ICD-10-CM | POA: Diagnosis not present

## 2017-02-01 DIAGNOSIS — Z9889 Other specified postprocedural states: Secondary | ICD-10-CM | POA: Diagnosis not present

## 2017-02-01 DIAGNOSIS — Z8249 Family history of ischemic heart disease and other diseases of the circulatory system: Secondary | ICD-10-CM | POA: Diagnosis not present

## 2017-02-01 DIAGNOSIS — Z803 Family history of malignant neoplasm of breast: Secondary | ICD-10-CM | POA: Diagnosis not present

## 2017-02-01 DIAGNOSIS — Z87891 Personal history of nicotine dependence: Secondary | ICD-10-CM | POA: Diagnosis not present

## 2017-02-01 NOTE — Patient Instructions (Signed)
A nerve block and cryoablation, freezing for the nerve was performed.  This should last for several months

## 2017-02-01 NOTE — Progress Notes (Signed)
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  Linear transducers utilized. Internal obliques were identified fascial plane and transversus abdominus  Muscles were identified in short axis view.A 25-gauge 1.5 inch needle was used and assess skin and subcutaneous tissue then a 54mm Echo block needle was inserted under direct ultrasound visualization to the fascial plane with identified nerve bundle. 5 cc of 0.25% Marcaine were infiltrated with distention of the fascial plane. Then a 18-gauge needle was used to Penetrate the skin and then the cryo-ablation probe 50 mm was inserted through the needle To the fascial plane next to the nerve bundle. Cryo ablation was performed for 60 seconds using the IOVERA cryoablation device. 1 lesion targeting iliohypogastric  Patient tolerated procedure well. Post procedure instructions given.

## 2017-02-02 ENCOUNTER — Telehealth: Payer: Self-pay

## 2017-02-02 NOTE — Telephone Encounter (Signed)
Patient called stating that she had a procedure yesterday in her hip (02/01/17) and was wondering if she is supposed to hurt more today than she did yesterday. Please advise.

## 2017-02-03 NOTE — Telephone Encounter (Signed)
Contacted patient and passed on Dr. Letta Pate medical advise.  Patient verbalized understanding.

## 2017-02-03 NOTE — Telephone Encounter (Signed)
There will likely be some muscle soreness for about a week may alternate heat and ice 20 min at a time

## 2017-02-08 ENCOUNTER — Ambulatory Visit: Payer: BLUE CROSS/BLUE SHIELD | Admitting: Family Medicine

## 2017-02-08 ENCOUNTER — Encounter: Payer: Self-pay | Admitting: Family Medicine

## 2017-02-08 VITALS — BP 124/62 | HR 81 | Temp 98.1°F | Wt 177.0 lb

## 2017-02-08 DIAGNOSIS — G4733 Obstructive sleep apnea (adult) (pediatric): Secondary | ICD-10-CM

## 2017-02-08 DIAGNOSIS — G4700 Insomnia, unspecified: Secondary | ICD-10-CM | POA: Diagnosis not present

## 2017-02-08 DIAGNOSIS — Z79899 Other long term (current) drug therapy: Secondary | ICD-10-CM | POA: Diagnosis not present

## 2017-02-08 DIAGNOSIS — M25551 Pain in right hip: Secondary | ICD-10-CM

## 2017-02-08 DIAGNOSIS — G43809 Other migraine, not intractable, without status migrainosus: Secondary | ICD-10-CM

## 2017-02-08 DIAGNOSIS — G8929 Other chronic pain: Secondary | ICD-10-CM

## 2017-02-08 DIAGNOSIS — M5417 Radiculopathy, lumbosacral region: Secondary | ICD-10-CM

## 2017-02-08 DIAGNOSIS — R634 Abnormal weight loss: Secondary | ICD-10-CM | POA: Diagnosis not present

## 2017-02-08 DIAGNOSIS — F331 Major depressive disorder, recurrent, moderate: Secondary | ICD-10-CM

## 2017-02-08 MED ORDER — DULOXETINE HCL 30 MG PO CPEP: ORAL_CAPSULE | ORAL | 3 refills | Status: DC

## 2017-02-08 MED ORDER — PREGABALIN 25 MG PO CAPS: 25.0000 mg | ORAL_CAPSULE | Freq: Two times a day (BID) | ORAL | 0 refills | Status: DC

## 2017-02-08 MED ORDER — TOPIRAMATE ER 100 MG PO CAP24: 1.0000 | ORAL_CAPSULE | Freq: Every day | ORAL | 3 refills | Status: DC

## 2017-02-08 NOTE — Progress Notes (Signed)
[name] White is a 51 y.o. female is here for follow up.  History of Present Illness:   HPI: See Assessment and Plan section for Problem Based Charting of issues discussed today.  There are no preventive care reminders to display for this patient. Depression screen [facility] 2/9 02/08/2017 01/17/2017 11/22/2016  Decreased Interest 3 1 2   Down, Depressed, Hopeless 3 1 2   PHQ - 2 Score 6 2 4   Altered sleeping 3 - 3  Tired, decreased energy 3 - 3  Change in appetite 3 - 3  Feeling bad or failure about yourself  3 - 0  Trouble concentrating 2 - 0  Moving slowly or fidgety/restless 0 - 0  Suicidal thoughts 0 - 0  PHQ-9 Score 20 - 13  Difficult doing work/chores Somewhat difficult - Somewhat difficult  Some recent data might be hidden   PMHx, SurgHx, SocialHx, FamHx, Medications, and Allergies were reviewed in the Visit Navigator and updated as appropriate.   Patient Active Problem List   Diagnosis Date Noted  . Gastroparesis 02/15/2017  . Facet arthropathy, lumbar 09/24/2016  . Osteoarthritis (arthritis due to wear and tear of joints) 09/24/2016  . Chronic pansinusitis 08/26/2016  . Weight loss 05/20/2016  . Helicobacter pylori ab+ 11/27/2015  . Personal history of colonic polyps 05/04/2015  . 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 Chronic right hip pain 04/16/2014  . Musculoskeletal chest pain 09/25/2013  . Chronic pelvic pain in female 07/06/2012  . Cystocele 07/06/2012  . Rectocele 07/06/2012  . Excessive sleepiness 01/30/2012  . OSA (obstructive sleep apnea)   . Depression   . Right lumbosacral radiculopathy 07/10/2010  . Dyslipidemia 05/05/2009  . BARRETTS ESOPHAGUS 10/04/2008  . HEADACHE, CHRONIC 09/28/2007   Social History   Tobacco Use  . Smoking status: Former Smoker    Packs/day: 0.50    Years: 30.00    Pack years: 15.00    Types: Cigarettes    Last attempt to quit: 06/21/2015    Years since quitting:  1.6  . Smokeless tobacco: Never Used  Substance Use Topics  . Alcohol use: Yes    Alcohol/week: 0.0 oz    Comment: 2 x a month  . Drug use: No   Current Medications and Allergies:   Current Outpatient Medications:  .  ALPRAZolam (XANAX) 1 MG tablet, TAKE 1/2 TO 1 (ONE-HALF TO ONE) TABLET BY MOUTH AT BEDTIME AS NEEDED, Disp: 30 tablet, Rfl: 0 .  cyclobenzaprine (FLEXERIL) 10 MG tablet, , Disp: , Rfl: 0 .  ketorolac (TORADOL) 10 MG tablet, Take 1 tablet (10 mg total) by mouth every 6 (six) hours as needed., Disp: 20 tablet, Rfl: 0 .  lidocaine (XYLOCAINE) 2 % solution, Use as directed 20 mLs in the mouth or throat as needed for mouth pain., Disp: 200 mL, Rfl: 2 .  lubiprostone (AMITIZA) 24 MCG capsule, Take 1 capsule (24 mcg total) by mouth 2 (two) times daily with a meal., Disp: 60 capsule, Rfl: 11 .  meclizine (ANTIVERT) 25 MG tablet, TAKE ONE TABLET BY MOUTH THREE TIMES DAILY AS NEEDED FOR DIZZINESS, Disp: 60 tablet, Rfl: 0 .  ondansetron (ZOFRAN) 4 MG tablet, TAKE ONE TABLET BY MOUTH EVERY 8 HOURS AS NEEDED FOR NAUSEA OR VOMITING, Disp: 30 tablet, Rfl: 11 .  pantoprazole (PROTONIX) 40 MG tablet, Take 1 tablet (40 mg total) by mouth daily., Disp: 90 tablet, Rfl: 3 .  promethazine (PHENERGAN) 12.5 MG tablet, Take 1 tablet (12.5  mg total) by mouth every 6 (six) hours as needed for nausea or vomiting., Disp: 30 tablet, Rfl: 0 .  rOPINIRole (REQUIP) 1 MG tablet, Take 1-2 tablets (1-2 mg total) at bedtime by mouth., Disp: 60 tablet, Rfl: 3 .  SUMAtriptan (IMITREX) 100 MG tablet, TAKE 1 AT THE EARLIEST ONSET OF HEADACHE. MAY REPEAT ONCE IN 2 HOURS IF HEADACHE PERSISTS OR RECURS. DO NOT EXCEED 2 TABLETS IN 24 HOURS, Disp: 10 tablet, Rfl: 2 .  SUMAtriptan (IMITREX) 100 MG tablet, TAKE 1 AT THE EARLIEST ONSET OF HEADACHE. MAY REPEAT ONCE IN 2 HOURS IF HEADACHE PERSISTS OR RECURS. DO NOT EXCEED 2 TABLETS IN 24 HOURS, Disp: 5 tablet, Rfl: 5 .  SUMAtriptan (IMITREX) 100 MG tablet, TAKE 1 AT THE EARLIEST  ONSET OF HEADACHE. MAY REPEAT ONCE IN 2 HOURS IF HEADACHE PERSISTS OR RECURS. DO NOT EXCEED 2 TABLETS IN 24 HOURS, Disp: 5 tablet, Rfl: 4 .  DULoxetine (CYMBALTA) 30 MG capsule, Take one 30 mg tablet by mouth once a day for the first week. Then increase to two 30 mg tablets ( total 60mg ) by mouth once daily., Disp: 60 capsule, Rfl: 3 .  pregabalin (LYRICA) 25 MG capsule, Take 1 capsule (25 mg total) by mouth 2 (two) times daily., Disp: 60 capsule, Rfl: 0 .  Topiramate ER (TROKENDI XR) 100 MG CP24, Take 1 tablet by mouth at bedtime., Disp: 30 capsule, Rfl: 3    Allergies  Allergen Reactions  . Morphine And Related Hives  . Penicillins Hives        Review of Systems   Pertinent items are noted in the HPI. Otherwise, ROS is negative.  Vitals:   Vitals:   02/08/17 1618  BP: 124/62  Pulse: 81  Temp: 98.1 F (36.7 C)  TempSrc: Oral  SpO2: 98%  Weight: 177 lb (80.3 kg)     Body mass index is 28.57 kg/m.   Physical Exam:   Physical Exam  Constitutional: She is oriented to person, place, and time. She appears well-developed and well-nourished. No distress.  HENT:  Head: Normocephalic and atraumatic.  Right Ear: External ear normal.  Left Ear: External ear normal.  Nose: Nose normal.  Mouth/Throat: Oropharynx is clear and moist.  Eyes: Conjunctivae and EOM are normal. Pupils are equal, round, and reactive to light.  Neck: Normal range of motion. Neck supple. No thyromegaly present.  Cardiovascular: Normal rate, regular rhythm, normal heart sounds and intact distal pulses.  Pulmonary/Chest: Effort normal and breath sounds normal.  Abdominal: Soft. Bowel sounds are normal.  Lymphadenopathy:    She has no cervical adenopathy.  Neurological: She is alert and oriented to person, place, and time.  Skin: Skin is warm and dry. Capillary refill takes less than 2 seconds.  Psychiatric: She has a normal mood and affect. Her behavior is normal.  Nursing note and vitals reviewed.    Assessment and Plan:   [name] was seen today for follow-up.  Diagnoses and all orders for this visit:  Right lumbosacral radiculopathy, Chronic right hip pain Comments: Followed by PRM, Orthopedics, and Neurosurgery. She has undergone spine surgery evaluation, with recommendation for a lumbar fusion of L4-5 and L5-S1. Previous treatment attempts include lumbar ESI,  Iliohypogastric nerve block. Will need note from Orthopedics and Neurosurgery. Orders: -     pregabalin (LYRICA) 25 MG capsule; Take 1 capsule (25 mg total) by mouth 2 (two) times daily.  Weight loss Comments: Stable.   Wt Readings from Last 3 Encounters:  02/08/17 177  lb (80.3 kg)  01/28/17 180 lb 9.6 oz (81.9 kg)  12/07/16 169 lb 12.8 oz (77 kg)   OSA (obstructive sleep apnea) Comments: CPAP compliant. Followed by Pulmonology.   Other migraine without status migrainosus, not intractable Comments: Slowly weaning down Topamax. No increase in migraine activity.  Orders: -     Topiramate ER (TROKENDI XR) 100 MG CP24; Take 1 tablet by mouth at bedtime.  Moderate episode of recurrent major depressive disorder (HCC) Comments: Depression symptoms: depressed mood, loss of interests/pleasure, change in sleep, change in appetite or weight, loss of energy, trouble concentrating Current psychosocial stressors include: health issues.   Symptoms have been gradually worsening since onset of treatment.  Alcohol use: no.  Drug use: no.  Exercise: no.   Patient denies current suicidal and homicidal ideation.  Patient was given information on SNRIs and possible side effects were reviewed. She was asked to contact us with any worsening in symptoms or suicidal thoughts and we discussed that it would take 2-4 weeks to begin to see improvement in her symptoms.  Orders: -     DULoxetine (CYMBALTA) 30 MG capsule; Take one 30 mg tablet by mouth once a day for the first week. Then increase to two 30 mg tablets ( total 60mg ) by mouth  once daily.  Insomnia, unspecified type Comments: Patient complains of difficulty falling asleep and non-restful sleep for the past several months.  Associated symptoms include anxiety, daytime somnolence, depression, restless legs, and irritability. Average duration of sleep is a few hours/night with several awakenings/night. The patient has been taking: Requip, 1/2 Xanax. Side effects from the medication: none.   Medication management Comments: We are focusing on de-prescribing a few of her medications that cause daytime sedation with the intention to use others that can help pain and depression.   . Reviewed expectations re: course of current medical issues. . Discussed self-management of symptoms. . Outlined signs and symptoms indicating need for more acute intervention. . Patient verbalized understanding and all questions were answered. Marland Kitchen Health Maintenance issues including appropriate healthy diet, exercise, and smoking avoidance were discussed with patient. . See orders for this visit as documented in the electronic medical record. . Patient received an After Visit Summary.  Briscoe Deutscher, DO [place], [facility] 02/15/2017  Future Appointments  Date Time Provider [place]  03/15/2017  9:30 AM Kirsteins, Luanna Salk, MD [facility] None  03/17/2017  2:30 PM Mauri Pole, MD [facility]  03/29/2017 11:30 AM Pieter Partridge, DO [facility] None

## 2017-02-10 ENCOUNTER — Telehealth: Payer: Self-pay

## 2017-02-10 ENCOUNTER — Encounter: Payer: Self-pay | Admitting: Family Medicine

## 2017-02-10 NOTE — Telephone Encounter (Signed)
Auth submitted on Cover my meds  Pending response Key [id number]

## 2017-02-15 ENCOUNTER — Encounter: Payer: Self-pay | Admitting: Family Medicine

## 2017-02-15 DIAGNOSIS — K3184 Gastroparesis: Secondary | ICD-10-CM | POA: Insufficient documentation

## 2017-02-16 ENCOUNTER — Encounter: Payer: Self-pay | Admitting: Family Medicine

## 2017-02-17 ENCOUNTER — Encounter: Payer: Self-pay | Admitting: Family Medicine

## 2017-02-17 ENCOUNTER — Other Ambulatory Visit: Payer: Self-pay | Admitting: Family Medicine

## 2017-02-17 ENCOUNTER — Ambulatory Visit: Payer: BLUE CROSS/BLUE SHIELD | Admitting: Family Medicine

## 2017-02-17 VITALS — BP 118/66 | HR 88 | Temp 98.0°F | Ht 66.0 in | Wt 181.8 lb

## 2017-02-17 DIAGNOSIS — G43109 Migraine with aura, not intractable, without status migrainosus: Secondary | ICD-10-CM

## 2017-02-17 MED ORDER — OXYCODONE-ACETAMINOPHEN 5-325 MG PO TABS: 1.0000 | ORAL_TABLET | Freq: Three times a day (TID) | ORAL | 0 refills | Status: DC | PRN

## 2017-02-17 MED ORDER — KETOROLAC TROMETHAMINE 60 MG/2ML IM SOLN
60.0000 mg | Freq: Once | INTRAMUSCULAR | Status: AC
  Administered 2017-02-17: 60 mg via INTRAMUSCULAR

## 2017-02-17 NOTE — Patient Instructions (Signed)
HOLD THE CYMBALTA.  OKAY TO START THE LOW DOSE LYRICA.  HYDRATE. CALL TOMORROW IF NOT IMPROVED.

## 2017-02-17 NOTE — Progress Notes (Signed)
[name] White is a 51 y.o. female here for an acute visit.  History of Present Illness:   [name] CMA acting as scribe for Dr. Juleen China.  HPI: Patient comes in today for an acute visit.   Migraine: Patient has had a migraine headache with nausea for 3 days now. She has not taken anything today for her headache. She took two Imitrex yesterday with no relief. Pain scale is 8 out of 10 today.   PMHx, SurgHx, SocialHx, Medications, and Allergies were reviewed in the Visit Navigator and updated as appropriate.  Current Medications:   Current Outpatient Medications:  .  ALPRAZolam (XANAX) 1 MG tablet, TAKE 1/2 TO 1 (ONE-HALF TO ONE) TABLET BY MOUTH AT BEDTIME AS NEEDED, Disp: 30 tablet, Rfl: 0 .  cyclobenzaprine (FLEXERIL) 10 MG tablet, , Disp: , Rfl: 0 .  DULoxetine (CYMBALTA) 30 MG capsule, Take one 30 mg tablet by mouth once a day for the first week. Then increase to two 30 mg tablets ( total 60mg ) by mouth once daily., Disp: 60 capsule, Rfl: 3 .  lubiprostone (AMITIZA) 24 MCG capsule, Take 1 capsule (24 mcg total) by mouth 2 (two) times daily with a meal., Disp: 60 capsule, Rfl: 11 .  pantoprazole (PROTONIX) 40 MG tablet, Take 1 tablet (40 mg total) by mouth daily., Disp: 90 tablet, Rfl: 3 .  pregabalin (LYRICA) 25 MG capsule, Take 1 capsule (25 mg total) by mouth 2 (two) times daily., Disp: 60 capsule, Rfl: 0 .  rOPINIRole (REQUIP) 1 MG tablet, Take 1-2 tablets (1-2 mg total) at bedtime by mouth., Disp: 60 tablet, Rfl: 3 .  SUMAtriptan (IMITREX) 100 MG tablet, TAKE 1 AT THE EARLIEST ONSET OF HEADACHE. MAY REPEAT ONCE IN 2 HOURS IF HEADACHE PERSISTS OR RECURS. DO NOT EXCEED 2 TABLETS IN 24 HOURS, Disp: 10 tablet, Rfl: 2 .  SUMAtriptan (IMITREX) 100 MG tablet, TAKE 1 AT THE EARLIEST ONSET OF HEADACHE. MAY REPEAT ONCE IN 2 HOURS IF HEADACHE PERSISTS OR RECURS. DO NOT EXCEED 2 TABLETS IN 24 HOURS, Disp: 5 tablet, Rfl: 5 .  SUMAtriptan (IMITREX) 100 MG tablet, TAKE 1 AT THE EARLIEST ONSET  OF HEADACHE. MAY REPEAT ONCE IN 2 HOURS IF HEADACHE PERSISTS OR RECURS. DO NOT EXCEED 2 TABLETS IN 24 HOURS, Disp: 5 tablet, Rfl: 4 .  topiramate (TOPAMAX) 50 MG tablet, , Disp: , Rfl: 0 .  Topiramate ER (TROKENDI XR) 100 MG CP24, Take 1 tablet by mouth at bedtime., Disp: 30 capsule, Rfl: 3   Allergies  Allergen Reactions  . Morphine And Related Hives  . Penicillins Hives        Review of Systems:   Pertinent items are noted in the HPI. Otherwise, ROS is negative.  Vitals:   Vitals:   02/17/17 1123  BP: 118/66  Pulse: 88  Temp: 98 F (36.7 C)  TempSrc: Oral  SpO2: 99%  Weight: 181 lb 12.8 oz (82.5 kg)  Height: 5\' 6"  (1.676 m)     Body mass index is 29.34 kg/m.  Physical Exam:   Physical Exam  Constitutional: She is oriented to person, place, and time. She appears well-nourished.  HENT:  Head: Normocephalic and atraumatic.  Eyes: EOM are normal. Pupils are equal, round, and reactive to light.  Neck: Normal range of motion. Neck supple.  Cardiovascular: Normal rate, regular rhythm, normal heart sounds and intact distal pulses.  Pulmonary/Chest: Effort normal.  Abdominal: Soft.  Neurological: She is alert and oriented to person, place, and time.  She is not disoriented. No cranial nerve deficit or sensory deficit.  Skin: Skin is warm.  Psychiatric: She has a normal mood and affect. Her behavior is normal.  Nursing note and vitals reviewed.   Assessment and Plan:   1. Migraine with aura and without status migrainosus, not intractable We changed a few medications recently, including decreasing Topamax and starting Cymbalta. She had a good week without headaches after decreasing the Tramadol from 150 - 100 mg. She then started the Cymbalta and headaches began. See AVS for instructions.   - ketorolac (TORADOL) injection 60 mg   . Reviewed expectations re: course of current medical issues. . Discussed self-management of symptoms. . Outlined signs and symptoms  indicating need for more acute intervention. . Patient verbalized understanding and all questions were answered. Marland Kitchen Health Maintenance issues including appropriate healthy diet, exercise, and smoking avoidance were discussed with patient. . See orders for this visit as documented in the electronic medical record. . Patient received an After Visit Summary.  CMA served as Education administrator during this visit. History, Physical, and Plan performed by medical provider. The above documentation has been reviewed and is accurate and complete. Briscoe Deutscher, D.O.  Briscoe Deutscher, DO [place] 02/20/2017

## 2017-02-17 NOTE — Telephone Encounter (Signed)
Please advise on refill.

## 2017-02-19 ENCOUNTER — Encounter: Payer: Self-pay | Admitting: Family Medicine

## 2017-02-20 ENCOUNTER — Encounter: Payer: Self-pay | Admitting: Family Medicine

## 2017-02-22 HISTORY — PX: BACK SURGERY: SHX140

## 2017-02-28 ENCOUNTER — Encounter: Payer: Self-pay | Admitting: Family Medicine

## 2017-03-01 ENCOUNTER — Telehealth: Payer: Self-pay | Admitting: Family Medicine

## 2017-03-01 NOTE — Telephone Encounter (Signed)
Please be advised of the note below.  °

## 2017-03-01 NOTE — Telephone Encounter (Signed)
Copied from [place]. Topic: Quick Communication - See Telephone Encounter >> Mar 01, 2017  4:01 PM Ether Griffins B wrote: CRM for notification. See Telephone encounter for:  [employer] wont pay for sumatriptan. Stating she has had 18 in 27 days and she has only had 10. Also wanting something for pain for her lower back and hip. Pt has also sent a mychart email  03/01/17.

## 2017-03-02 ENCOUNTER — Telehealth: Payer: Self-pay

## 2017-03-02 ENCOUNTER — Encounter: Payer: Self-pay | Admitting: Family Medicine

## 2017-03-02 NOTE — Telephone Encounter (Signed)
Called patient to get some understanding on several open my chart messages. After getting all information I have reviewed all with Dr. Juleen China and gave information to patient.  1. She will not start back on Cymbalta 2. She will go back up on the Topiramate to the            original dose. Due to ongoing migraine      medications.  3. She has follow up Neurology on 03/29/17. She will call and see if she can move it up sooner.   She was also informed that sometimes sending multiple messages for provider with out response makes it very confusing for staff and provider to gather all information.

## 2017-03-02 NOTE — Telephone Encounter (Signed)
Please advise 

## 2017-03-02 NOTE — Telephone Encounter (Signed)
Patient called today. See phone note.

## 2017-03-07 NOTE — Telephone Encounter (Signed)
Please follow up on this - has she improved to baseline?

## 2017-03-08 NOTE — Telephone Encounter (Signed)
Called patient not doing any better. Neurologist will not see any sooner. She is going to get appointment to get eye checked to see if that may help any.

## 2017-03-10 ENCOUNTER — Encounter: Payer: Self-pay | Admitting: Family Medicine

## 2017-03-11 ENCOUNTER — Other Ambulatory Visit: Payer: Self-pay | Admitting: Family Medicine

## 2017-03-11 MED ORDER — OXYCODONE-ACETAMINOPHEN 5-325 MG PO TABS: 1.0000 | ORAL_TABLET | Freq: Three times a day (TID) | ORAL | 0 refills | Status: DC | PRN

## 2017-03-11 MED ORDER — OXYCODONE-ACETAMINOPHEN 5-325 MG PO TABS: 1.0000 | ORAL_TABLET | ORAL | 0 refills | Status: DC | PRN

## 2017-03-11 NOTE — Addendum Note (Signed)
Addended by: Briscoe Deutscher R on: 03/11/2017 04:20 PM   Modules accepted: Orders

## 2017-03-11 NOTE — Telephone Encounter (Signed)
MEDICATION:   PHARMACY:    IS THIS A 90 DAY SUPPLY :   IS PATIENT OUT OF MEDICATION:   IF NOT; HOW MUCH IS LEFT:   LAST APPOINTMENT DATE: @1 /10/2017  NEXT APPOINTMENT DATE:@Visit  date not found  OTHER COMMENTS:    **Let patient know to contact pharmacy at the end of the day to make sure medication is ready. **  ** Please notify patient to allow 48-72 hours to process**  **Encourage patient to contact the pharmacy for refills or they can request refills through [facility]**

## 2017-03-15 ENCOUNTER — Ambulatory Visit: Payer: BLUE CROSS/BLUE SHIELD | Admitting: Physical Medicine & Rehabilitation

## 2017-03-15 ENCOUNTER — Encounter: Payer: BLUE CROSS/BLUE SHIELD | Attending: Physical Medicine & Rehabilitation

## 2017-03-15 ENCOUNTER — Encounter: Payer: Self-pay | Admitting: Family Medicine

## 2017-03-15 ENCOUNTER — Encounter: Payer: Self-pay | Admitting: Physical Medicine & Rehabilitation

## 2017-03-15 VITALS — BP 110/69 | HR 82 | Resp 14

## 2017-03-15 DIAGNOSIS — Z9851 Tubal ligation status: Secondary | ICD-10-CM | POA: Insufficient documentation

## 2017-03-15 DIAGNOSIS — M25551 Pain in right hip: Secondary | ICD-10-CM | POA: Diagnosis not present

## 2017-03-15 DIAGNOSIS — Z8249 Family history of ischemic heart disease and other diseases of the circulatory system: Secondary | ICD-10-CM | POA: Insufficient documentation

## 2017-03-15 DIAGNOSIS — Z87891 Personal history of nicotine dependence: Secondary | ICD-10-CM | POA: Diagnosis not present

## 2017-03-15 DIAGNOSIS — G8929 Other chronic pain: Secondary | ICD-10-CM

## 2017-03-15 DIAGNOSIS — Z808 Family history of malignant neoplasm of other organs or systems: Secondary | ICD-10-CM | POA: Insufficient documentation

## 2017-03-15 DIAGNOSIS — G588 Other specified mononeuropathies: Secondary | ICD-10-CM | POA: Insufficient documentation

## 2017-03-15 DIAGNOSIS — Z90710 Acquired absence of both cervix and uterus: Secondary | ICD-10-CM | POA: Diagnosis not present

## 2017-03-15 DIAGNOSIS — F419 Anxiety disorder, unspecified: Secondary | ICD-10-CM | POA: Diagnosis not present

## 2017-03-15 DIAGNOSIS — Z9889 Other specified postprocedural states: Secondary | ICD-10-CM | POA: Insufficient documentation

## 2017-03-15 DIAGNOSIS — Z803 Family history of malignant neoplasm of breast: Secondary | ICD-10-CM | POA: Insufficient documentation

## 2017-03-15 DIAGNOSIS — Z823 Family history of stroke: Secondary | ICD-10-CM | POA: Diagnosis not present

## 2017-03-15 DIAGNOSIS — G5791 Unspecified mononeuropathy of right lower limb: Secondary | ICD-10-CM | POA: Diagnosis not present

## 2017-03-15 NOTE — Progress Notes (Signed)
Subjective:    Patient ID: [name] White, female    DOB: [birth year], 52 y.o.   MRN: [id number] 52 year old female with at least 3-year history of right sided inguinal pain as well as right hip pain.  She underwent x-rays of the right hip in 2016 demonstrating some mild osteoarthritis.  She underwent right hip intra-articular injection but did not recall her response.  Immediately after her injection she did not have any pain relief. She has undergone orthopedic evaluation subsequent to this, told by Dr. Gladstone Lighter at [facility] that she had indication for hip replacement after reviewing her MRI.  She plans to follow-up today.  Patient is also undergone neurosurgical evaluation.  MRI of the lumbar spine has been reviewed mild degenerative changes no severe stenosis.  She was offered fusion.  She had a normal MRI of the thoracic spine.  She is undergone several surgeries in the palm and pelvic area.  Is undergone total abdominal hysterectomy as well as lysis of adhesions laparoscopically.  Also has had C-section x2.  Patient symptom onset occurred following  Pelvic surgery in 2012  Patient describes burning pain in the inguinal region on the right side starting, patient does not feel like this pain is deep in her bowel area.  Area is sensitive to touch  Underwent iliohypogastric nerve block in 2016 followed by cryoablation of ileohypogastric nerve which helped for ~2wks  A repeat cryoablation of R iliiohypogastric nerve was performed 11/15/2014 and helped > 28mo  Repeat procedure right iliohypogastric right ilioinguinal nerve cryoablation performed 03/11/2015 helped but unclear for how long.  She did not seek medical attention for this problem for about a year.  Then was referred to neurosurgery HPI Most recently underwent repeat cryoablation right iliohypogastric nerve performed on 02/01/2017.  This helped for about 2 weeks. Patient plans to follow-up with orthopedic surgery today to  follow-up on hip arthritis pain. We discussed that I do not have the results of her  recent MRI of the hip, she will sign a release for this. She was referred to physical medicine rehab at [facility] to repeat facet injections, told the physician she already had these and that they were not helpful. Pain Inventory Average Pain 7 Pain Right Now 7 My pain is burning, dull, tingling and aching  In the last 24 hours, has pain interfered with the following? General activity 10 Relation with others 10 Enjoyment of life 10 What TIME of day is your pain at its worst? all Sleep (in general) Fair  Pain is worse with: walking, bending, sitting, standing and some activites Pain improves with: rest, pacing activities and medication Relief from Meds: 4  Mobility walk with assistance use a cane how many minutes can you walk? 10 ability to climb steps?  no do you drive?  yes transfers alone  Function employed # of hrs/week 30 I need assistance with the following:  household duties  Neuro/Psych bowel control problems weakness numbness tingling trouble walking spasms  Prior Studies Any changes since last visit?  yes  Physicians involved in your care Any changes since last visit?  no   Family History  Problem Relation Age of Onset  . COPD Mother   . Stroke Mother   . Coronary artery disease Mother        pacer  . Colon polyps Mother   . Breast cancer Mother   . Migraines Mother   . Other Mother        RSDS  . Rheum  arthritis Mother   . Hypertension Father   . Colon polyps Brother   . Colon cancer Maternal Grandfather   . Colon polyps Maternal Grandfather   . Breast cancer Maternal Aunt   . Uterine cancer Cousin        x 2  . Aneurysm Maternal Grandmother   . Kidney disease Brother   . Other Brother        back pain  . Kidney cancer Paternal Aunt   . Stomach cancer Neg Hx    Social History   Socioeconomic History  . Marital status: Divorced    Spouse  name: None  . Number of children: 2  . Years of education: None  . Highest education level: None  Social Needs  . Financial resource strain: None  . Food insecurity - worry: None  . Food insecurity - inability: None  . Transportation needs - medical: None  . Transportation needs - non-medical: None  Occupational History  . Occupation: Bartender  Tobacco Use  . Smoking status: Former Smoker    Packs/day: 0.50    Years: 30.00    Pack years: 15.00    Types: Cigarettes    Last attempt to quit: 06/21/2015    Years since quitting: 1.7  . Smokeless tobacco: Never Used  Substance and Sexual Activity  . Alcohol use: Yes    Alcohol/week: 0.0 oz    Comment: 2 x a month  . Drug use: No  . Sexual activity: None  Other Topics Concern  . None  Social History Narrative   Occupation: bartender   Divorced  -- currently single   Alcohol use-yes -x 5 per weekend   No longer a smoker   Past Surgical History:  Procedure Laterality Date  . ABDOMINAL HYSTERECTOMY     secondary to endometriosis  . CESAREAN SECTION     x 2  . CHOLECYSTECTOMY    . COLONOSCOPY W/ POLYPECTOMY  2012  . ELBOW SURGERY     left  . LAPAROSCOPY  04-22-10   lysis of adhesions  . LAPAROSCOPY  04-30-10    with mini laparotomy and cystotomy repair  . plantar fascitis     right foot  . SALPINGECTOMY  04-22-10   right  . TUBAL LIGATION    . WRIST SURGERY     right/ganglion cyst   Past Medical History:  Diagnosis Date  . Anterolisthesis   . Anxiety    per pt, has panic attacks  . Arthritis    in back per pt.  . Barrett's esophagus    egd 2007 + 2009 but none 2011 or 08/2010 egd  . Chronic headache   . Colon polyp    hyperplastic  . Depression    hx of suicide attempt in 2003  . Grief reaction   . Hidradenitis suppurativa   . IBS (irritable bowel syndrome)   . Migraine   . OSA (obstructive sleep apnea)   . Ovarian cyst   . Restless leg   . Tobacco abuse    BP 110/69 (BP Location: Left Arm, Patient  Position: Sitting, Cuff Size: Normal)   Pulse 82   Resp 14   SpO2 99%   Opioid Risk Score:   Fall Risk Score:  `1  Depression screen PHQ 2/9  Depression screen [facility] 2/9 02/08/2017 01/17/2017 11/22/2016 03/11/2015 10/08/2014 05/10/2014 08/30/2012  Decreased Interest 3 1 2 3 3 3 1   Down, Depressed, Hopeless 3 1 2 2 2 3 1   PHQ - 2 Score  6 2 4 5 5 6 2   Altered sleeping 3 - 3 - 2 2 0  Tired, decreased energy 3 - 3 - 2 2 1   Change in appetite 3 - 3 - 0 2 0  Feeling bad or failure about yourself  3 - 0 - 1 3 0  Trouble concentrating 2 - 0 - 1 1 0  Moving slowly or fidgety/restless 0 - 0 - 3 2 0  Suicidal thoughts 0 - 0 - 0 1 0  PHQ-9 Score 20 - 13 - 14 19 3   Difficult doing work/chores Somewhat difficult - Somewhat difficult - Somewhat difficult - -  Some recent data might be hidden     Review of Systems  Constitutional: Positive for appetite change.  HENT: Negative.   Eyes: Negative.   Respiratory: Negative.   Cardiovascular: Negative.   Gastrointestinal: Positive for abdominal pain, constipation, diarrhea, nausea and vomiting.  Endocrine: Negative.   Genitourinary: Negative.   Musculoskeletal: Positive for arthralgias, back pain, gait problem and myalgias.  Skin: Negative.   Allergic/Immunologic: Negative.   Neurological: Positive for weakness and numbness.       Tingling  Hematological: Negative.   Psychiatric/Behavioral: Positive for confusion and dysphoric mood. The patient is nervous/anxious.   All other systems reviewed and are negative.      Objective:   Physical Exam  Constitutional: She is oriented to person, place, and time. She appears well-developed and well-nourished. No distress.  HENT:  Head: Normocephalic and atraumatic.  Eyes: Conjunctivae and EOM are normal. Pupils are equal, round, and reactive to light.  Musculoskeletal:  Tenderness palpation lumbar paraspinal area mainly on the left side.  No significant tenderness on the right side. There is no  tenderness of the gluteus medius area no tenderness over the greater trochanters of the hip. There is tenderness over the right ASIS particularly medial to it. No masses palpable in inguinal area  Negative straight leg raising  Neurological: She is alert and oriented to person, place, and time.  Motor strength is 5/5 bilateral hip flexor knee extensor ankle dorsiflexor No pain with knee range of motion no pain with ankle range of motion there is groin discomfort with right hip internal rotation but not with the left side.  Skin: Skin is warm and dry. She is not diaphoretic.  Nursing note and vitals reviewed.         Assessment & Plan:  #1.  Chronic right groin pain onset after pelvic surgery in 2012.  She is undergone orthopedic as well as neurosurgical evaluation. We discussed lumbar spine MRI which showed no significant stenosis particularly in L1 or L2 areas which would correspond with some of her symptoms.  Similarly MRI of the thoracic spine did not show any compressive lesions affecting T11 or T12 nerve roots. We discussed that I do not think her hip pain or groin pain is related to her lumbar spine.  Patient does have evidence of mild hip osteoarthritis.  She has seen one orthopedic surgeon who did not advise surgery for her hip problem and another one who advised total hip replacement.  I do not have the most recent MRI results.  She will discuss this further noon afternoon visit today with orthopedic surgery. Would repeat right intra-articular hip injection to see if she could get at least a short-term relief with this and assess whether her hip joint is 1 of her pain generators.  She has had good temporary relief with both iliohypogastric ilioinguinal nerve blocks  as well as cryoablation.  I suspect she has a nerve injury from her pelvic surgeries in 2012. We discussed that there are other procedures that may give a more prolonged response such as radiofrequency versus peripheral  nerve stimulation of the ilioinguinal/iliohypogastric nerves.  We discussed that I do not perform these procedures but I can make referral to [facility] for this.  Over half of the 25 min visit was spent counseling and coordinating care.

## 2017-03-15 NOTE — Patient Instructions (Signed)
I think you have a nerve pain issue rather than a spine issue or radiculopathy  We will eval if some pain is coming from hip joint as well.  Xray guided hip jt injection

## 2017-03-17 ENCOUNTER — Telehealth: Payer: Self-pay

## 2017-03-17 ENCOUNTER — Ambulatory Visit: Payer: BLUE CROSS/BLUE SHIELD | Admitting: Gastroenterology

## 2017-03-17 ENCOUNTER — Encounter: Payer: Self-pay | Admitting: Gastroenterology

## 2017-03-17 VITALS — BP 100/56 | HR 92 | Ht 65.0 in | Wt 181.0 lb

## 2017-03-17 DIAGNOSIS — K219 Gastro-esophageal reflux disease without esophagitis: Secondary | ICD-10-CM

## 2017-03-17 DIAGNOSIS — K582 Mixed irritable bowel syndrome: Secondary | ICD-10-CM | POA: Diagnosis not present

## 2017-03-17 DIAGNOSIS — K5900 Constipation, unspecified: Secondary | ICD-10-CM

## 2017-03-17 NOTE — Telephone Encounter (Signed)
Form received for pre op clearance placed in folder for you to review. Do we need to make f/u app to fill out?

## 2017-03-17 NOTE — Progress Notes (Signed)
[name] White    [id number]    01-Oct-1965  Primary Care Physician:Wallace, Danae Chen, DO  Referring Physician: Inda Coke, [address] [place], [place] [address]  Chief complaint: Constipation with alternating diarrhea  HPI: 52 year old female here with complaints of persistent constipation and alternating diarrhea She feels her GI symptoms have been on since she had a complicated tubectomy with bladder tear, peritonitis and significant adhesions resulting from that.   Has had extensive GI workup negative for any significant GI pathology.  Emptying scan was normal.  EGD and colonoscopy by Dr. Collene Mares in 2017 October with unremarkable.  No significant improvement with Xifaxan or probiotic.  She is currently taking Amitiza 24 mcg twice daily and Linzess 290 mcg every 2-3 days.  Linzess worked for about 1-2 months and after that it stopped working.  She is unable to have regular bowel movement with only Amitiza and when she takes additional Linzess she has diarrhea Continues to have intermittent nausea. Was tearful and appeared depressed, denies any suicidal or homicidal ideation.  She is  frustrated that she has to deal with her GI issues since the complicated tubectomy    Outpatient Encounter Medications as of 03/17/2017  Medication Sig  . ALPRAZolam (XANAX) 1 MG tablet TAKE 1/2 TO 1 (ONE-HALF TO ONE) TABLET BY MOUTH AT BEDTIME AS NEEDED  . pantoprazole (PROTONIX) 40 MG tablet Take 1 tablet (40 mg total) by mouth daily.  Marland Kitchen rOPINIRole (REQUIP) 1 MG tablet Take 1-2 tablets (1-2 mg total) at bedtime by mouth.  . SUMAtriptan (IMITREX) 100 MG tablet TAKE 1 AT THE EARLIEST ONSET OF HEADACHE. MAY REPEAT ONCE IN 2 HOURS IF HEADACHE PERSISTS OR RECURS. DO NOT EXCEED 2 TABLETS IN 24 HOURS  . Topiramate ER (TROKENDI XR) 100 MG CP24 Take 1 tablet by mouth at bedtime.  . [DISCONTINUED] DULoxetine (CYMBALTA) 30 MG capsule Take one 30 mg tablet by mouth once a day for the first  week. Then increase to two 30 mg tablets ( total 60mg ) by mouth once daily. (Patient not taking: Reported on 03/15/2017)  . [DISCONTINUED] lubiprostone (AMITIZA) 24 MCG capsule Take 1 capsule (24 mcg total) by mouth 2 (two) times daily with a meal.  . [DISCONTINUED] pregabalin (LYRICA) 25 MG capsule Take 1 capsule (25 mg total) by mouth 2 (two) times daily. (Patient not taking: Reported on 03/15/2017)  . [DISCONTINUED] topiramate (TOPAMAX) 50 MG tablet 150 mg.    No facility-administered encounter medications on file as of 03/17/2017.     Allergies as of 03/17/2017 - Review Complete 03/17/2017  Allergen Reaction Noted  . Morphine and related Hives 08/06/2010  . Penicillins Hives 10/04/2008    Past Medical History:  Diagnosis Date  . Anterolisthesis   . Anxiety    per pt, has panic attacks  . Arthritis    in back per pt.  . Barrett's esophagus    egd 2007 + 2009 but none 2011 or 08/2010 egd  . Chronic headache   . Colon polyp    hyperplastic  . Depression    hx of suicide attempt in 2003  . Grief reaction   . Hidradenitis suppurativa   . IBS (irritable bowel syndrome)   . Migraine   . OSA (obstructive sleep apnea)   . Ovarian cyst   . Restless leg   . Tobacco abuse     Past Surgical History:  Procedure Laterality Date  . ABDOMINAL HYSTERECTOMY  secondary to endometriosis  . CESAREAN SECTION     x 2  . CHOLECYSTECTOMY    . COLONOSCOPY W/ POLYPECTOMY  2012  . ELBOW SURGERY     left  . LAPAROSCOPY  04-22-10   lysis of adhesions  . LAPAROSCOPY  04-30-10    with mini laparotomy and cystotomy repair  . plantar fascitis     right foot  . SALPINGECTOMY  04-22-10   right  . TUBAL LIGATION    . WRIST SURGERY     right/ganglion cyst    Family History  Problem Relation Age of Onset  . COPD Mother   . Stroke Mother   . Coronary artery disease Mother        pacer  . Colon polyps Mother   . Breast cancer Mother   . Migraines Mother   . Other Mother        RSDS  .  Rheum arthritis Mother   . Hypertension Father   . Colon polyps Brother   . Colon cancer Maternal Grandfather   . Colon polyps Maternal Grandfather   . Breast cancer Maternal Aunt   . Uterine cancer Cousin        x 2  . Aneurysm Maternal Grandmother   . Kidney disease Brother   . Other Brother        back pain  . Kidney cancer Paternal Aunt   . Stomach cancer Neg Hx     Social History   Socioeconomic History  . Marital status: Divorced    Spouse name: Not on file  . Number of children: 2  . Years of education: Not on file  . Highest education level: Not on file  Social Needs  . Financial resource strain: Not on file  . Food insecurity - worry: Not on file  . Food insecurity - inability: Not on file  . Transportation needs - medical: Not on file  . Transportation needs - non-medical: Not on file  Occupational History  . Occupation: Bartender  Tobacco Use  . Smoking status: Former Smoker    Packs/day: 0.50    Years: 30.00    Pack years: 15.00    Types: Cigarettes    Last attempt to quit: 06/21/2015    Years since quitting: 1.7  . Smokeless tobacco: Never Used  Substance and Sexual Activity  . Alcohol use: Yes    Alcohol/week: 0.0 oz    Comment: 2 x a month  . Drug use: No  . Sexual activity: Not on file  Other Topics Concern  . Not on file  Social History Narrative   Occupation: bartender   Divorced  -- currently single   Alcohol use-yes -x 5 per weekend   No longer a smoker      Review of systems: Review of Systems  Constitutional: Negative for fever and chills.  HENT: Negative.   Eyes: Negative for blurred vision.  Respiratory: Negative for cough, shortness of breath and wheezing.   Cardiovascular: Negative for chest pain and palpitations.  Gastrointestinal: as per HPI Genitourinary: Negative for dysuria, urgency, frequency and hematuria.  Musculoskeletal: Negative for myalgias, back pain and joint pain.  Skin: Negative for itching and rash.    Neurological: Negative for dizziness, tremors, focal weakness, seizures and loss of consciousness.  Endo/Heme/Allergies: Positive for seasonal allergies.  Psychiatric/Behavioral: Negative for depression, suicidal ideas and hallucinations.  All other systems reviewed and are negative.   Physical Exam: Vitals:   03/17/17 1443  BP: (!) 100/56  Pulse: 92   Body mass index is 30.12 kg/m. Gen:      No acute distress HEENT:  EOMI, sclera anicteric Neck:     No masses; no thyromegaly Lungs:    Clear to auscultation bilaterally; normal respiratory effort CV:         Regular rate and rhythm; no murmurs Abd:      + bowel sounds; soft, non-tender; no palpable masses, no distension Ext:    No edema; adequate peripheral perfusion Skin:      Warm and dry; no rash Neuro: alert and oriented x 3 Psych: normal mood and affect  Data Reviewed:  Reviewed labs, radiology imaging, old records and pertinent past GI work up   Assessment and Plan/Recommendations:  53 year old female with irritable bowel syndrome constipation alternating with diarrhea Advised patient to give Linzess 290 mcg daily a try again and see if it will work for her better alone without Amitiza Her insurance is no longer covering Amitiza If no improvement with Linzess will consider switching to Trulance Increase dietary fiber and fluid intake Due for recall colorectal cancer screening 2027  GERD: Continue Protonix daily For reflux measures and lifestyle modification  25 minutes was spent face-to-face with the patient. Greater than 50% of the time used for counseling as well as treatment plan and follow-up. She had multiple questions which were answered to her satisfaction  K. Denzil Magnuson , MD [phone] Mon-Fri 8a-5p [phone] after 5p, weekends, holidays  CC: Inda Coke, [place]

## 2017-03-17 NOTE — Patient Instructions (Signed)
Follow up as needed   If you are age 53 or older, your body mass index should be between 23-30. Your Body mass index is 30.12 kg/m. If this is out of the aforementioned range listed, please consider follow up with your Primary Care Provider.  If you are age 41 or younger, your body mass index should be between 19-25. Your Body mass index is 30.12 kg/m. If this is out of the aformentioned range listed, please consider follow up with your Primary Care Provider.

## 2017-03-21 NOTE — Telephone Encounter (Signed)
Called patient app made for follow up.

## 2017-03-21 NOTE — Telephone Encounter (Signed)
Please let the patient know that I had the form basically completed, however the clearance requests some information that we have not obtained from her yet.  Ask her to come in for a visit to complete all of the required testing.

## 2017-03-21 NOTE — Telephone Encounter (Signed)
Preoperative Information   CBC Latest Ref Rng & Units 12/07/2016 05/20/2016 11/19/2015  WBC 4.0 - 10.5 K/uL 6.6 5.5 6.8  Hemoglobin 12.0 - 15.0 g/dL 13.0 13.7 13.7  Hematocrit 36.0 - 46.0 % 39.9 41.4 40.3  Platelets 150.0 - 400.0 K/uL 178.0 187.0 185.0   Lab Results  Component Value Date   ALT 22 10/07/2016   AST 17 10/07/2016   ALKPHOS 65 10/07/2016   BILITOT 0.5 10/07/2016   Lab Results  Component Value Date   LABPROT 14.1 04/30/2010   Lab Results  Component Value Date   HGBA1C 6.0 05/20/2016

## 2017-03-24 ENCOUNTER — Encounter: Payer: Self-pay | Admitting: Physical Medicine & Rehabilitation

## 2017-03-25 ENCOUNTER — Telehealth: Payer: Self-pay | Admitting: Physical Medicine & Rehabilitation

## 2017-03-25 ENCOUNTER — Encounter: Payer: Self-pay | Admitting: Gastroenterology

## 2017-03-29 ENCOUNTER — Encounter: Payer: Self-pay | Admitting: Family Medicine

## 2017-03-29 ENCOUNTER — Ambulatory Visit: Payer: BLUE CROSS/BLUE SHIELD | Admitting: Physical Medicine & Rehabilitation

## 2017-03-29 ENCOUNTER — Ambulatory Visit: Payer: BLUE CROSS/BLUE SHIELD | Admitting: Family Medicine

## 2017-03-29 ENCOUNTER — Ambulatory Visit (INDEPENDENT_AMBULATORY_CARE_PROVIDER_SITE_OTHER): Payer: BLUE CROSS/BLUE SHIELD

## 2017-03-29 ENCOUNTER — Ambulatory Visit: Payer: BLUE CROSS/BLUE SHIELD | Admitting: Neurology

## 2017-03-29 ENCOUNTER — Encounter: Payer: Self-pay | Admitting: Neurology

## 2017-03-29 VITALS — BP 100/58 | HR 78 | Ht 65.0 in | Wt 181.6 lb

## 2017-03-29 VITALS — BP 116/78 | HR 71 | Temp 98.4°F | Ht 66.0 in | Wt 180.2 lb

## 2017-03-29 DIAGNOSIS — G43009 Migraine without aura, not intractable, without status migrainosus: Secondary | ICD-10-CM | POA: Diagnosis not present

## 2017-03-29 DIAGNOSIS — F32A Depression, unspecified: Secondary | ICD-10-CM

## 2017-03-29 DIAGNOSIS — Z01818 Encounter for other preprocedural examination: Secondary | ICD-10-CM | POA: Diagnosis not present

## 2017-03-29 DIAGNOSIS — F329 Major depressive disorder, single episode, unspecified: Secondary | ICD-10-CM

## 2017-03-29 MED ORDER — HYDROCODONE-ACETAMINOPHEN 5-325 MG PO TABS: 1.0000 | ORAL_TABLET | Freq: Four times a day (QID) | ORAL | 0 refills | Status: DC | PRN

## 2017-03-29 NOTE — Telephone Encounter (Signed)
error 

## 2017-03-29 NOTE — Progress Notes (Signed)
Subjective:   [name] White is a 52 y.o. female who is seen in consultation referred by their surgeon in preparation for surgery.  She has not experienced previous episodes of delirium.  Cardiovascular ROS: No history of MI, stents or cardiac cath. Denies any history of CP, SOB, DOE, PND, orthopnea, palpitations, dizziness, lightheadedness and syncope.  No arrhythmias or LE edema.  The following portions of the patient's history were reviewed and updated as appropriate: allergies, current medications, past family history, past medical history, past social history, past surgical history and problem list.  Review of Systems Pertinent items noted in HPI and remainder of comprehensive ROS otherwise negative.    Objective:    BP 116/78   Pulse 71   Temp 98.4 F (36.9 C) (Oral)   Ht 5\' 6"  (1.676 m)   Wt 180 lb 3.2 oz (81.7 kg)   SpO2 98%   BMI 29.09 kg/m   General Appearance:    Alert, cooperative, no distress, appears stated age  Head:    Normocephalic, without obvious abnormality, atraumatic  Eyes:    PERRL, conjunctiva/corneas clear, EOM's intact, fundi    benign, both eyes  Ears:    Normal TM's and external ear canals, both ears  Nose:   Nares normal, septum midline, mucosa normal, no drainage    or sinus tenderness  Throat:   Lips, mucosa, and tongue normal; teeth and gums normal  Neck:   Supple, symmetrical, trachea midline, no adenopathy;    thyroid:  no enlargement/tenderness/nodules; no carotid   bruit or JVD  Back:     Symmetric, no curvature, ROM normal, no CVA tenderness  Lungs:     Clear to auscultation bilaterally, respirations unlabored  Chest Wall:    No tenderness or deformity   Heart:    Regular rate and rhythm, S1 and S2 normal, no murmur, rub   or gallop  Abdomen:     Soft, non-tender, bowel sounds active all four quadrants,    no masses, no organomegaly  Extremities:   Extremities normal, atraumatic, no cyanosis or edema  Pulses:   2+ and symmetric all  extremities  Skin:   Skin color, texture, turgor normal, no rashes or lesions  Lymph nodes:   Cervical, supraclavicular, and axillary nodes normal  Neurologic:   CNII-XII intact, normal strength, sensation and reflexes    throughout   Cardiographics ECG: normal sinus rhythm, no blocks or conduction defects, no ischemic changes Echocardiogram: not done  Imaging Chest x-ray: normal   Lab Review  Results for orders placed or performed in visit on 03/29/17  CBC with Differential/Platelet  Result Value Ref Range   WBC 5.3 4.0 - 10.5 K/uL   RBC 3.90 3.87 - 5.11 Mil/uL   Hemoglobin 12.5 12.0 - 15.0 g/dL   HCT 37.9 36.0 - 46.0 %   MCV 97.2 78.0 - 100.0 fl   MCHC 33.1 30.0 - 36.0 g/dL   RDW 12.6 11.5 - 15.5 %   Platelets 183.0 150.0 - 400.0 K/uL   Neutrophils Relative % 39.7 (L) 43.0 - 77.0 %   Lymphocytes Relative 46.3 (H) 12.0 - 46.0 %   Monocytes Relative 10.9 3.0 - 12.0 %   Eosinophils Relative 2.6 0.0 - 5.0 %   Basophils Relative 0.5 0.0 - 3.0 %   Neutro Abs 2.1 1.4 - 7.7 K/uL   Lymphs Abs 2.4 0.7 - 4.0 K/uL   Monocytes Absolute 0.6 0.1 - 1.0 K/uL   Eosinophils Absolute 0.1 0.0 -  0.7 K/uL   Basophils Absolute 0.0 0.0 - 0.1 K/uL  Comprehensive metabolic panel  Result Value Ref Range   Sodium 139 135 - 145 mEq/L   Potassium 3.7 3.5 - 5.1 mEq/L   Chloride 106 96 - 112 mEq/L   CO2 29 19 - 32 mEq/L   Glucose, Bld 93 70 - 99 mg/dL   BUN 12 6 - 23 mg/dL   Creatinine, Ser 0.81 0.40 - 1.20 mg/dL   Total Bilirubin 0.4 0.2 - 1.2 mg/dL   Alkaline Phosphatase 62 39 - 117 U/L   AST 14 0 - 37 U/L   ALT 23 0 - 35 U/L   Total Protein 6.8 6.0 - 8.3 g/dL   Albumin 4.2 3.5 - 5.2 g/dL   Calcium 9.6 8.4 - 10.5 mg/dL   GFR 78.78 >60.00 mL/min  Hemoglobin A1c  Result Value Ref Range   Hgb A1c MFr Bld 5.8 4.6 - 6.5 %  Urinalysis, Routine w reflex microscopic  Result Value Ref Range   Color, Urine YELLOW Yellow;Lt. Yellow   APPearance CLEAR Clear   Specific Gravity, Urine <=1.005 (A)  1.000 - 1.030   pH 7.0 5.0 - 8.0   Total Protein, Urine NEGATIVE Negative   Urine Glucose NEGATIVE Negative   Ketones, ur NEGATIVE Negative   Bilirubin Urine NEGATIVE Negative   Hgb urine dipstick NEGATIVE Negative   Urobilinogen, UA 0.2 0.0 - 1.0   Leukocytes, UA NEGATIVE Negative   Nitrite NEGATIVE Negative   WBC, UA none seen 0-2/hpf   RBC / HPF none seen 0-2/hpf   Squamous Epithelial / LPF Rare(0-4/hpf) Rare(0-4/hpf)   Ca Oxalate Crys, UA Presence of (A) None    Assessment:      52 y.o. female with planned hip surgery.   Known risk factors for perioperative complications: None    The 10-year ASCVD risk score Mikey Bussing DC Jr., et al., 2013) is: 1.2%   Values used to calculate the score:     Age: 89 years     Sex: Female     Is Non-Hispanic African American: No     Diabetic: No     Tobacco smoker: No     Systolic Blood Pressure: 597 mmHg     Is BP treated: No     HDL Cholesterol: 55.5 mg/dL     Total Cholesterol: 202 mg/dL   Plan:   1. Preoperative assessment: No contraindications to planned surgery  2. The risk of post-operative delirium has been discussed.  Briscoe Deutscher

## 2017-03-29 NOTE — Progress Notes (Signed)
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   UPDATE: Headaches are well-controlled Intensity:  4/10 Duration:  30 minutes Frequency:  One every 2 to 3 months Current NSAIDS:  no Current analgesics:  no Current triptans:  sumatriptan 100mg  Current anti-emetic:  no Current muscle relaxants:  no Current anti-anxiolytic:  alprazolam Current sleep aide:  alprazolam Current Antihypertensive medications:  no Current Antidepressant medications:  no Current Anticonvulsant medications:  topiramate 150mg  Current Vitamins/Herbal/Supplements:  no Current Antihistamines/Decongestants:  no Other therapy:  no Other medication:  meclizine   Caffeine:  no Alcohol:  no Smoker:  no Diet:  Increased water, decreased chocolate Exercise:  no Depression:  yes; Anxiety:  yes Sleep hygiene:  Poor  She has been suffering from both low back and hip pain and reportedly needs surgery for both.  It has caused a great deal of pain and distress.   HISTORY: MIGRAINE: Onset:  Around early 24s Location:  Top of head, band-like distribution Quality:  squeezing Initial Intensity:  7/10 (sometimes 10/10):  She denies new headache, worst headache of her life or severe headache that wakes her up from sleep. Aura:  no Prodrome:  no Associated symptoms:  Nausea, photophobia, phonophobia.  She denies unilateral numbness or weakness. Initial Duration:  4 hours or sometimes up to a day; December: 30 minutes Initial Frequency:  3 to 4 times a month; December: 3 days per month Triggers/exacerbating factors:  no Relieving factors:  Sleep in dark Activity:  Needs to lay down to sleep   Past NSAIDS:  ibuprofen Past analgesics:  Tylenol, Excedrin Past abortive triptans:  sumatriptan 25mg  (does not recall ever taking) Past muscle relaxants:no   Past anti-emetic:   Zofran 8mg , Phenergan Past sleep aide:  melatonin, Ambien Past antihypertensive medications:  no Past antidepressant medications:  venlafaxine XR 150mg  (for depression).  Cymbalta (started briefly for two weeks) Past anticonvulsant medications:  gabapentin 1200mg  three times daily, Lyrica 100mg  three times daily (both for neuropathic nerve pain from hip surgery)     Family history of headache:  Mother and grandmother had migraines.  Grandmother had aneurysm.   In 2017, she was reporting episodes of dizziness that occur suddenly, usually while walking.  It is not positional.  It is not spinning but rather a wave-like movement.  There is no associated nausea, double vision, focal numbness.  It lasts for a few minutes and resolves.  It occurs twice a day.  She takes meclizine, which is not too effective. CTA of head was unremarkable, revealing no vertebrobasilar insufficiency.  Etiology did not seem neurologic.   MRI of brain without contrast from 06/05/15 and showed complex enlarged pineal gland over 1.9 cm with mild flattening of the superior colliculus.  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    Repeat MRI of the brain with and without contrast from 07/26/16 again demonstrated stable non-enhancing pineal gland cyst, but otherwise unremarkable except for some paranasal sinus disease.  PAST MEDICAL HISTORY: Past Medical History:  Diagnosis Date  . Anterolisthesis   . Anxiety    per pt, has panic attacks  . Arthritis    in back per pt.  . Barrett's esophagus    egd 2007 + 2009 but none 2011  or 08/2010 egd  . Chronic headache   . Colon polyp    hyperplastic  . Depression    hx of suicide attempt in 2003  . Grief reaction   . Hidradenitis suppurativa   . IBS (irritable bowel syndrome)   . Migraine   . OSA (obstructive sleep apnea)   . Ovarian cyst   . Restless leg   . Tobacco abuse      MEDICATIONS: Current Outpatient Medications on File Prior to Visit  Medication Sig Dispense Refill  . topiramate (TOPAMAX) 50 MG tablet Take 150 mg by mouth at bedtime.    . ALPRAZolam (XANAX) 1 MG tablet TAKE 1/2 TO 1 (ONE-HALF TO ONE) TABLET BY MOUTH AT BEDTIME AS NEEDED 30 tablet 0  . pantoprazole (PROTONIX) 40 MG tablet Take 1 tablet (40 mg total) by mouth daily. 90 tablet 3  . rOPINIRole (REQUIP) 1 MG tablet Take 1-2 tablets (1-2 mg total) at bedtime by mouth. 60 tablet 3  . SUMAtriptan (IMITREX) 100 MG tablet TAKE 1 AT THE EARLIEST ONSET OF HEADACHE. MAY REPEAT ONCE IN 2 HOURS IF HEADACHE PERSISTS OR RECURS. DO NOT EXCEED 2 TABLETS IN 24 HOURS 10 tablet 2   No current facility-administered medications on file prior to visit.     ALLERGIES: Allergies  Allergen Reactions  . Morphine And Related Hives  . Penicillins Hives         FAMILY HISTORY: Family History  Problem Relation Age of Onset  . COPD Mother   . Stroke Mother   . Coronary artery disease Mother        pacer  . Colon polyps Mother   . Breast cancer Mother   . Migraines Mother   . Other Mother        RSDS  . Rheum arthritis Mother   . Hypertension Father   . Colon polyps Brother   . Colon cancer Maternal Grandfather   . Colon polyps Maternal Grandfather   . Breast cancer Maternal Aunt   . Uterine cancer Cousin        x 2  . Aneurysm Maternal Grandmother   . Kidney disease Brother   . Other Brother        back pain  . Kidney cancer Paternal Aunt   . Stomach cancer Neg Hx     SOCIAL HISTORY: Social History   Socioeconomic History  . Marital status: Divorced    Spouse name: Not on file  . Number of children: 2  . Years of education: Not on file  . Highest education level: Not on file  Social Needs  . Financial resource strain: Not on file  . Food insecurity - worry: Not on file  . Food insecurity - inability: Not on file  . Transportation needs - medical: Not on file  . Transportation  needs - non-medical: Not on file  Occupational History  . Occupation: Bartender  Tobacco Use  . Smoking status: Former Smoker    Packs/day: 0.50    Years: 30.00    Pack years: 15.00    Types: Cigarettes    Last attempt to quit: 06/21/2015    Years since quitting: 1.7  . Smokeless tobacco: Never Used  Substance and Sexual Activity  . Alcohol use: Yes    Alcohol/week: 0.0 oz    Comment: 2 x a month  . Drug use: No  . Sexual activity: Not on file  Other Topics Concern  . Not on file  Social History Narrative  Occupation: Chief Operating Officer   Divorced  -- currently single   Alcohol use-yes -x 5 per weekend   No longer a smoker    REVIEW OF SYSTEMS: Constitutional: No fevers, chills, or sweats, no generalized fatigue, change in appetite Eyes: No visual changes, double vision, eye pain Ear, nose and throat: No hearing loss, ear pain, nasal congestion, sore throat Cardiovascular: No chest pain, palpitations Respiratory:  No shortness of breath at rest or with exertion, wheezes GastrointestinaI: gastroparesis Genitourinary:  No dysuria, urinary retention or frequency Musculoskeletal:  Back and hip pain Integumentary: No rash, pruritus, skin lesions Neurological: as above Psychiatric: No depression, insomnia, anxiety Endocrine: No palpitations, fatigue, diaphoresis, mood swings, change in appetite, change in weight, increased thirst Hematologic/Lymphatic:  No purpura, petechiae. Allergic/Immunologic: no itchy/runny eyes, nasal congestion, recent allergic reactions, rashes  PHYSICAL EXAM: Vitals:   03/29/17 1146  BP: (!) 100/58  Pulse: 78  SpO2: 97%   General: No acute distress.  Patient appears well-groomed.   Head:  Normocephalic/atraumatic Eyes:  Fundi examined but not visualized Neck: supple, no paraspinal tenderness, full range of motion Heart:  Regular rate and rhythm Lungs:  Clear to auscultation bilaterally Neurological Exam: alert and oriented to person, place, and time.  Attention span and concentration intact, recent and remote memory intact, fund of knowledge intact.  Speech fluent and not dysarthric, language intact.  CN II-XII intact. Bulk and tone normal, muscle strength 5/5 throughout.  Sensation to light touch  intact.  Deep tendon reflexes 2+ throughout.  Finger to nose testing intact.  Gait normal, Romberg negative.  IMPRESSION: Migraine without aura, without status migrainosus, not intractable Depression  PLAN: 1.  Continue topiramate 150mg  at bedtime 2.  Sumatriptan 100mg  or Tylenol as needed.  Limit use of pain relievers to no more than 2 days out of week to prevent rebound headache. 3.  Headache diary, sleep hygiene discussed, continue hydration 4.  Discuss with Dr. Juleen [name] about restarting Cymbalta for both depression and neuralgia.  She was previously on it for 2 weeks but discontinued due to concern that it triggered her migraines.  I think it is unrelated. 5.  Follow up in 8 months.  Metta Clines, DO  CC:  Briscoe Deutscher, DO

## 2017-03-29 NOTE — Patient Instructions (Signed)
1.  Continue topiramate 150mg  at bedtime 2.  At earliest onset of migraine, take sumatriptan 100mg .  May repeat once after two hours if needed.  Do not exceed 2 tablets in 24 hours. 3.  Follow up in 8 months.

## 2017-03-30 LAB — URINALYSIS, ROUTINE W REFLEX MICROSCOPIC
Bilirubin Urine: NEGATIVE
Hgb urine dipstick: NEGATIVE
Ketones, ur: NEGATIVE
Leukocytes, UA: NEGATIVE
Nitrite: NEGATIVE
RBC / HPF: NONE SEEN (ref 0–?)
Specific Gravity, Urine: <=1.005 — AB (ref 1.000–1.030)
Total Protein, Urine: NEGATIVE
Urine Glucose: NEGATIVE
Urobilinogen, UA: 0.2 (ref 0.0–1.0)
WBC, UA: NONE SEEN (ref 0–?)
pH: 7 (ref 5.0–8.0)

## 2017-03-30 LAB — CBC WITH DIFFERENTIAL/PLATELET
Basophils Absolute: 0 10*3/uL (ref 0.0–0.1)
Basophils Relative: 0.5 % (ref 0.0–3.0)
Eosinophils Absolute: 0.1 10*3/uL (ref 0.0–0.7)
Eosinophils Relative: 2.6 % (ref 0.0–5.0)
HCT: 37.9 % (ref 36.0–46.0)
Hemoglobin: 12.5 g/dL (ref 12.0–15.0)
Lymphocytes Relative: 46.3 % — ABNORMAL HIGH (ref 12.0–46.0)
Lymphs Abs: 2.4 10*3/uL (ref 0.7–4.0)
MCHC: 33.1 g/dL (ref 30.0–36.0)
MCV: 97.2 fl (ref 78.0–100.0)
Monocytes Absolute: 0.6 10*3/uL (ref 0.1–1.0)
Monocytes Relative: 10.9 % (ref 3.0–12.0)
Neutro Abs: 2.1 10*3/uL (ref 1.4–7.7)
Neutrophils Relative %: 39.7 % — ABNORMAL LOW (ref 43.0–77.0)
Platelets: 183 10*3/uL (ref 150.0–400.0)
RBC: 3.9 Mil/uL (ref 3.87–5.11)
RDW: 12.6 % (ref 11.5–15.5)
WBC: 5.3 10*3/uL (ref 4.0–10.5)

## 2017-03-30 LAB — COMPREHENSIVE METABOLIC PANEL
ALT: 23 U/L (ref 0–35)
AST: 14 U/L (ref 0–37)
Albumin: 4.2 g/dL (ref 3.5–5.2)
Alkaline Phosphatase: 62 U/L (ref 39–117)
BUN: 12 mg/dL (ref 6–23)
CO2: 29 mEq/L (ref 19–32)
Calcium: 9.6 mg/dL (ref 8.4–10.5)
Chloride: 106 mEq/L (ref 96–112)
Creatinine, Ser: 0.81 mg/dL (ref 0.40–1.20)
GFR: 78.78 mL/min (ref >60.00)
Glucose, Bld: 93 mg/dL (ref 70–99)
Potassium: 3.7 mEq/L (ref 3.5–5.1)
Sodium: 139 mEq/L (ref 135–145)
Total Bilirubin: 0.4 mg/dL (ref 0.2–1.2)
Total Protein: 6.8 g/dL (ref 6.0–8.3)

## 2017-03-30 LAB — HEMOGLOBIN A1C: Hgb A1c MFr Bld: 5.8 % (ref 4.6–6.5)

## 2017-03-31 ENCOUNTER — Encounter: Payer: Self-pay | Admitting: Family Medicine

## 2017-04-06 ENCOUNTER — Ambulatory Visit: Payer: Self-pay | Admitting: Orthopedic Surgery

## 2017-04-13 ENCOUNTER — Ambulatory Visit: Payer: Self-pay | Admitting: Orthopedic Surgery

## 2017-04-13 ENCOUNTER — Other Ambulatory Visit: Payer: Self-pay | Admitting: Family Medicine

## 2017-04-13 NOTE — H&P (View-Only) (Signed)
TOTAL HIP ADMISSION H&P  Patient is admitted for right total hip arthroplasty.  Subjective:  Chief Complaint: right hip pain  HPI: [name] White, 52 y.o. female, has a history of pain and functional disability in the right hip(s) due to arthritis and patient has failed non-surgical conservative treatments for greater than 12 weeks to include NSAID's and/or analgesics, flexibility and strengthening excercises, supervised PT with diminished ADL's post treatment, use of assistive devices, weight reduction as appropriate and activity modification.  Onset of symptoms was gradual starting 4 years ago with rapidlly worsening course since that time.The patient noted no past surgery on the right hip(s).  Patient currently rates pain in the right hip at 10 out of 10 with activity. Patient has night pain, worsening of pain with activity and weight bearing, trendelenberg gait, pain that interfers with activities of daily living, pain with passive range of motion and crepitus. Patient has evidence of subchondral cysts, subchondral sclerosis, periarticular osteophytes and joint space narrowing by imaging studies. This condition presents safety issues increasing the risk of falls. There is no current active infection.  Patient Active Problem List   Diagnosis Date Noted  . Gastroparesis 02/15/2017  . Facet arthropathy, lumbar 09/24/2016  . Osteoarthritis (arthritis due to wear and tear of joints) 09/24/2016  . Chronic pansinusitis 08/26/2016  . Weight loss 05/20/2016  . Helicobacter pylori ab+ 11/27/2015  . Personal history of colonic polyps 05/04/2015  . 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 Chronic right hip pain 04/16/2014  . Musculoskeletal chest pain 09/25/2013  . Chronic pelvic pain in female 07/06/2012  . Cystocele 07/06/2012  . Rectocele 07/06/2012  . Excessive sleepiness 01/30/2012  . OSA (obstructive sleep apnea)   . Depression   .  Right lumbosacral radiculopathy 07/10/2010  . Dyslipidemia 05/05/2009  . BARRETTS ESOPHAGUS 10/04/2008  . HEADACHE, CHRONIC 09/28/2007   Past Medical History:  Diagnosis Date  . Anterolisthesis   . Anxiety    per pt, has panic attacks  . Arthritis    in back per pt.  . Barrett's esophagus    egd 2007 + 2009 but none 2011 or 08/2010 egd  . Chronic headache   . Colon polyp    hyperplastic  . Depression    hx of suicide attempt in 2003  . Grief reaction   . Hidradenitis suppurativa   . IBS (irritable bowel syndrome)   . Migraine   . OSA (obstructive sleep apnea)   . Ovarian cyst   . Restless leg   . Tobacco abuse     Past Surgical History:  Procedure Laterality Date  . ABDOMINAL HYSTERECTOMY     secondary to endometriosis  . CESAREAN SECTION     x 2  . CHOLECYSTECTOMY    . COLONOSCOPY W/ POLYPECTOMY  2012  . ELBOW SURGERY     left  . LAPAROSCOPY  04-22-10   lysis of adhesions  . LAPAROSCOPY  04-30-10    with mini laparotomy and cystotomy repair  . plantar fascitis     right foot  . SALPINGECTOMY  04-22-10   right  . TUBAL LIGATION    . WRIST SURGERY     right/ganglion cyst    Current Outpatient Medications  Medication Sig Dispense Refill Last Dose  . ALPRAZolam (XANAX) 1 MG tablet TAKE 1/2 TO 1 (ONE-HALF TO ONE) TABLET BY MOUTH AT BEDTIME AS NEEDED (Patient taking differently: TAKE 1/2 TO 1 (ONE-HALF TO ONE) TABLET BY MOUTH AT  BEDTIME) 30 tablet 0 Taking  . DULoxetine (CYMBALTA) 30 MG capsule Take 60 mg by mouth at bedtime.     Marland Kitchen HYDROcodone-acetaminophen (NORCO/VICODIN) 5-325 MG tablet Take 1 tablet by mouth every 6 (six) hours as needed for moderate pain. (Patient taking differently: Take 1 tablet by mouth every 6 (six) hours. ) 40 tablet 0   . lidocaine (XYLOCAINE) 2 % solution Use as directed 20 mLs in the mouth or throat as needed.     . linaclotide (LINZESS) 290 MCG CAPS capsule TAKE 1 TABLET BY MOUTH DAILY   Taking  . lubiprostone (AMITIZA) 24 MCG capsule Take  24 mcg by mouth 2 (two) times daily with a meal.     . nitroGLYCERIN (NITROSTAT) 0.4 MG SL tablet nitroglycerin 0.4 mg sublingual tablet  as needed for chest pain     . ondansetron (ZOFRAN) 4 MG tablet TAKE 1 TABLET BY MOUTH EVERY 6 HOURS AS NEEDED FOR NAUSEA   Taking  . OVER THE COUNTER MEDICATION Take 1 tablet by mouth 3 (three) times daily. IBgard     . pantoprazole (PROTONIX) 40 MG tablet Take 1 tablet (40 mg total) by mouth daily. (Patient taking differently: Take 40 mg by mouth every evening. ) 90 tablet 3 Taking  . ranitidine (ZANTAC) 300 MG tablet Take 300 mg by mouth at bedtime.     Marland Kitchen rOPINIRole (REQUIP) 1 MG tablet Take 1-2 tablets (1-2 mg total) at bedtime by mouth. (Patient taking differently: Take 2 mg by mouth at bedtime. ) 60 tablet 3 Taking  . SUMAtriptan (IMITREX) 100 MG tablet TAKE 1 AT THE EARLIEST ONSET OF HEADACHE. MAY REPEAT ONCE IN 2 HOURS IF HEADACHE PERSISTS OR RECURS. DO NOT EXCEED 2 TABLETS IN 24 HOURS 10 tablet 2 Taking  . topiramate (TOPAMAX) 50 MG tablet Take 150 mg by mouth at bedtime. 3 tablets   Taking   No current facility-administered medications for this visit.    Allergies  Allergen Reactions  . Morphine And Related Hives  . Penicillins Hives and Itching     Has patient had a PCN reaction causing immediate rash, facial/tongue/throat swelling, SOB or lightheadedness with hypotension: Yes Has patient had a PCN reaction causing severe rash involving mucus membranes or skin necrosis: Unknown Has patient had a PCN reaction that required hospitalization: No Has patient had a PCN reaction occurring within the last 10 years: No If all of the above answers are "NO", then may proceed with Cephalosporin use.     Social History   Tobacco Use  . Smoking status: Former Smoker    Packs/day: 0.50    Years: 30.00    Pack years: 15.00    Types: Cigarettes    Last attempt to quit: 06/21/2015    Years since quitting: 1.8  . Smokeless tobacco: Never Used  Substance  Use Topics  . Alcohol use: Yes    Alcohol/week: 0.0 oz    Comment: 2 x a month    Family History  Problem Relation Age of Onset  . COPD Mother   . Stroke Mother   . Coronary artery disease Mother        pacer  . Colon polyps Mother   . Breast cancer Mother   . Migraines Mother   . Other Mother        RSDS  . Rheum arthritis Mother   . Hypertension Father   . Colon polyps Brother   . Colon cancer Maternal Grandfather   . Colon polyps Maternal Grandfather   .  Breast cancer Maternal Aunt   . Uterine cancer Cousin        x 2  . Aneurysm Maternal Grandmother   . Kidney disease Brother   . Other Brother        back pain  . Kidney cancer Paternal Aunt   . Stomach cancer Neg Hx      Review of Systems  Constitutional: Positive for malaise/fatigue.  HENT: Negative.   Eyes: Negative.   Respiratory: Negative.   Cardiovascular: Negative.   Gastrointestinal: Positive for abdominal pain, constipation and nausea.  Genitourinary: Negative.   Musculoskeletal: Positive for joint pain.  Skin: Negative.   Neurological: Negative.   Endo/Heme/Allergies: Negative.   Psychiatric/Behavioral: Negative.     Objective:  Physical Exam  Vitals reviewed. Constitutional: She is oriented to person, place, and time. She appears well-developed and well-nourished.  HENT:  Head: Normocephalic.  Eyes: Conjunctivae and EOM are normal.  Neck: Normal range of motion. Neck supple.  Cardiovascular: Normal rate, regular rhythm and intact distal pulses.  Respiratory: Effort normal. No respiratory distress.  GI: Soft. She exhibits no distension.  Genitourinary:  Genitourinary Comments: deferred  Musculoskeletal:       Right hip: She exhibits decreased range of motion and bony tenderness.  Neurological: She is alert and oriented to person, place, and time. She has normal reflexes.  Skin: Skin is warm and dry.  Psychiatric: She has a normal mood and affect. Her behavior is normal. Judgment and  thought content normal.    Vital signs in last 24 hours: @VSRANGES @  Labs:   Estimated body mass index is 29.09 kg/m as calculated from the following:   Height as of 03/29/17: 5\' 6"  (1.676 m).   Weight as of 03/29/17: 81.7 kg (180 lb 3.2 oz).   Imaging Review Plain radiographs demonstrate severe degenerative joint disease of the right hip(s). The bone quality appears to be adequate for age and reported activity level.  Assessment/Plan:  End stage arthritis, right hip(s)  The patient history, physical examination, clinical judgement of the provider and imaging studies are consistent with end stage degenerative joint disease of the right hip(s) and total hip arthroplasty is deemed medically necessary. The treatment options including medical management, injection therapy, arthroscopy and arthroplasty were discussed at length. The risks and benefits of total hip arthroplasty were presented and reviewed. The risks due to aseptic loosening, infection, stiffness, dislocation/subluxation,  thromboembolic complications and other imponderables were discussed.  The patient acknowledged the explanation, agreed to proceed with the plan and consent was signed. Patient is being admitted for inpatient treatment for surgery, pain control, PT, OT, prophylactic antibiotics, VTE prophylaxis, progressive ambulation and ADL's and discharge planning.The patient is planning to be discharged home with HEP

## 2017-04-13 NOTE — H&P (Signed)
TOTAL HIP ADMISSION H&P  Patient is admitted for right total hip arthroplasty.  Subjective:  Chief Complaint: right hip pain  HPI: [name] White, 52 y.o. female, has a history of pain and functional disability in the right hip(s) due to arthritis and patient has failed non-surgical conservative treatments for greater than 12 weeks to include NSAID's and/or analgesics, flexibility and strengthening excercises, supervised PT with diminished ADL's post treatment, use of assistive devices, weight reduction as appropriate and activity modification.  Onset of symptoms was gradual starting 4 years ago with rapidlly worsening course since that time.The patient noted no past surgery on the right hip(s).  Patient currently rates pain in the right hip at 10 out of 10 with activity. Patient has night pain, worsening of pain with activity and weight bearing, trendelenberg gait, pain that interfers with activities of daily living, pain with passive range of motion and crepitus. Patient has evidence of subchondral cysts, subchondral sclerosis, periarticular osteophytes and joint space narrowing by imaging studies. This condition presents safety issues increasing the risk of falls. There is no current active infection.  Patient Active Problem List   Diagnosis Date Noted  . Gastroparesis 02/15/2017  . Facet arthropathy, lumbar 09/24/2016  . Osteoarthritis (arthritis due to wear and tear of joints) 09/24/2016  . Chronic pansinusitis 08/26/2016  . Weight loss 05/20/2016  . Helicobacter pylori ab+ 11/27/2015  . Personal history of colonic polyps 05/04/2015  . 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 Chronic right hip pain 04/16/2014  . Musculoskeletal chest pain 09/25/2013  . Chronic pelvic pain in female 07/06/2012  . Cystocele 07/06/2012  . Rectocele 07/06/2012  . Excessive sleepiness 01/30/2012  . OSA (obstructive sleep apnea)   . Depression   .  Right lumbosacral radiculopathy 07/10/2010  . Dyslipidemia 05/05/2009  . BARRETTS ESOPHAGUS 10/04/2008  . HEADACHE, CHRONIC 09/28/2007   Past Medical History:  Diagnosis Date  . Anterolisthesis   . Anxiety    per pt, has panic attacks  . Arthritis    in back per pt.  . Barrett's esophagus    egd 2007 + 2009 but none 2011 or 08/2010 egd  . Chronic headache   . Colon polyp    hyperplastic  . Depression    hx of suicide attempt in 2003  . Grief reaction   . Hidradenitis suppurativa   . IBS (irritable bowel syndrome)   . Migraine   . OSA (obstructive sleep apnea)   . Ovarian cyst   . Restless leg   . Tobacco abuse     Past Surgical History:  Procedure Laterality Date  . ABDOMINAL HYSTERECTOMY     secondary to endometriosis  . CESAREAN SECTION     x 2  . CHOLECYSTECTOMY    . COLONOSCOPY W/ POLYPECTOMY  2012  . ELBOW SURGERY     left  . LAPAROSCOPY  04-22-10   lysis of adhesions  . LAPAROSCOPY  04-30-10    with mini laparotomy and cystotomy repair  . plantar fascitis     right foot  . SALPINGECTOMY  04-22-10   right  . TUBAL LIGATION    . WRIST SURGERY     right/ganglion cyst    Current Outpatient Medications  Medication Sig Dispense Refill Last Dose  . ALPRAZolam (XANAX) 1 MG tablet TAKE 1/2 TO 1 (ONE-HALF TO ONE) TABLET BY MOUTH AT BEDTIME AS NEEDED (Patient taking differently: TAKE 1/2 TO 1 (ONE-HALF TO ONE) TABLET BY MOUTH AT  BEDTIME) 30 tablet 0 Taking  . DULoxetine (CYMBALTA) 30 MG capsule Take 60 mg by mouth at bedtime.     Marland Kitchen HYDROcodone-acetaminophen (NORCO/VICODIN) 5-325 MG tablet Take 1 tablet by mouth every 6 (six) hours as needed for moderate pain. (Patient taking differently: Take 1 tablet by mouth every 6 (six) hours. ) 40 tablet 0   . lidocaine (XYLOCAINE) 2 % solution Use as directed 20 mLs in the mouth or throat as needed.     . linaclotide (LINZESS) 290 MCG CAPS capsule TAKE 1 TABLET BY MOUTH DAILY   Taking  . lubiprostone (AMITIZA) 24 MCG capsule Take  24 mcg by mouth 2 (two) times daily with a meal.     . nitroGLYCERIN (NITROSTAT) 0.4 MG SL tablet nitroglycerin 0.4 mg sublingual tablet  as needed for chest pain     . ondansetron (ZOFRAN) 4 MG tablet TAKE 1 TABLET BY MOUTH EVERY 6 HOURS AS NEEDED FOR NAUSEA   Taking  . OVER THE COUNTER MEDICATION Take 1 tablet by mouth 3 (three) times daily. IBgard     . pantoprazole (PROTONIX) 40 MG tablet Take 1 tablet (40 mg total) by mouth daily. (Patient taking differently: Take 40 mg by mouth every evening. ) 90 tablet 3 Taking  . ranitidine (ZANTAC) 300 MG tablet Take 300 mg by mouth at bedtime.     Marland Kitchen rOPINIRole (REQUIP) 1 MG tablet Take 1-2 tablets (1-2 mg total) at bedtime by mouth. (Patient taking differently: Take 2 mg by mouth at bedtime. ) 60 tablet 3 Taking  . SUMAtriptan (IMITREX) 100 MG tablet TAKE 1 AT THE EARLIEST ONSET OF HEADACHE. MAY REPEAT ONCE IN 2 HOURS IF HEADACHE PERSISTS OR RECURS. DO NOT EXCEED 2 TABLETS IN 24 HOURS 10 tablet 2 Taking  . topiramate (TOPAMAX) 50 MG tablet Take 150 mg by mouth at bedtime. 3 tablets   Taking   No current facility-administered medications for this visit.    Allergies  Allergen Reactions  . Morphine And Related Hives  . Penicillins Hives and Itching     Has patient had a PCN reaction causing immediate rash, facial/tongue/throat swelling, SOB or lightheadedness with hypotension: Yes Has patient had a PCN reaction causing severe rash involving mucus membranes or skin necrosis: Unknown Has patient had a PCN reaction that required hospitalization: No Has patient had a PCN reaction occurring within the last 10 years: No If all of the above answers are "NO", then may proceed with Cephalosporin use.     Social History   Tobacco Use  . Smoking status: Former Smoker    Packs/day: 0.50    Years: 30.00    Pack years: 15.00    Types: Cigarettes    Last attempt to quit: 06/21/2015    Years since quitting: 1.8  . Smokeless tobacco: Never Used  Substance  Use Topics  . Alcohol use: Yes    Alcohol/week: 0.0 oz    Comment: 2 x a month    Family History  Problem Relation Age of Onset  . COPD Mother   . Stroke Mother   . Coronary artery disease Mother        pacer  . Colon polyps Mother   . Breast cancer Mother   . Migraines Mother   . Other Mother        RSDS  . Rheum arthritis Mother   . Hypertension Father   . Colon polyps Brother   . Colon cancer Maternal Grandfather   . Colon polyps Maternal Grandfather   .  Breast cancer Maternal Aunt   . Uterine cancer Cousin        x 2  . Aneurysm Maternal Grandmother   . Kidney disease Brother   . Other Brother        back pain  . Kidney cancer Paternal Aunt   . Stomach cancer Neg Hx      Review of Systems  Constitutional: Positive for malaise/fatigue.  HENT: Negative.   Eyes: Negative.   Respiratory: Negative.   Cardiovascular: Negative.   Gastrointestinal: Positive for abdominal pain, constipation and nausea.  Genitourinary: Negative.   Musculoskeletal: Positive for joint pain.  Skin: Negative.   Neurological: Negative.   Endo/Heme/Allergies: Negative.   Psychiatric/Behavioral: Negative.     Objective:  Physical Exam  Vitals reviewed. Constitutional: She is oriented to person, place, and time. She appears well-developed and well-nourished.  HENT:  Head: Normocephalic.  Eyes: Conjunctivae and EOM are normal.  Neck: Normal range of motion. Neck supple.  Cardiovascular: Normal rate, regular rhythm and intact distal pulses.  Respiratory: Effort normal. No respiratory distress.  GI: Soft. She exhibits no distension.  Genitourinary:  Genitourinary Comments: deferred  Musculoskeletal:       Right hip: She exhibits decreased range of motion and bony tenderness.  Neurological: She is alert and oriented to person, place, and time. She has normal reflexes.  Skin: Skin is warm and dry.  Psychiatric: She has a normal mood and affect. Her behavior is normal. Judgment and  thought content normal.    Vital signs in last 24 hours: @VSRANGES @  Labs:   Estimated body mass index is 29.09 kg/m as calculated from the following:   Height as of 03/29/17: 5\' 6"  (1.676 m).   Weight as of 03/29/17: 81.7 kg (180 lb 3.2 oz).   Imaging Review Plain radiographs demonstrate severe degenerative joint disease of the right hip(s). The bone quality appears to be adequate for age and reported activity level.  Assessment/Plan:  End stage arthritis, right hip(s)  The patient history, physical examination, clinical judgement of the provider and imaging studies are consistent with end stage degenerative joint disease of the right hip(s) and total hip arthroplasty is deemed medically necessary. The treatment options including medical management, injection therapy, arthroscopy and arthroplasty were discussed at length. The risks and benefits of total hip arthroplasty were presented and reviewed. The risks due to aseptic loosening, infection, stiffness, dislocation/subluxation,  thromboembolic complications and other imponderables were discussed.  The patient acknowledged the explanation, agreed to proceed with the plan and consent was signed. Patient is being admitted for inpatient treatment for surgery, pain control, PT, OT, prophylactic antibiotics, VTE prophylaxis, progressive ambulation and ADL's and discharge planning.The patient is planning to be discharged home with HEP

## 2017-04-13 NOTE — Pre-Procedure Instructions (Signed)
[name] White  04/13/2017      [place], [place] - [address] [name] [address] Phone: [phone] Fax: [phone]    Your procedure is scheduled on Monday March 4.  Report to [facility] Admitting at 9:45 A.M.  Call this number if you have problems the morning of surgery:  [phone]   Remember:  Do not eat food or drink liquids after midnight.  Take these medicines the morning of surgery with A SIP OF WATER:   Hydrocodone-acetaminophen (Norco) if needed  7 days prior to surgery STOP taking any Aspirin(unless otherwise instructed by your surgeon), Aleve, Naproxen, Ibuprofen, Motrin, Advil, Goody's, BC's, all herbal medications, fish oil, and all vitamins     Do not wear jewelry, make-up or nail polish.  Do not wear lotions, powders, or perfumes, or deodorant.  Do not shave 48 hours prior to surgery.  Men may shave face and neck.  Do not bring valuables to the hospital.  [facility] is not responsible for any belongings or valuables.  Contacts, dentures or bridgework may not be worn into surgery.  Leave your suitcase in the car.  After surgery it may be brought to your room.  For patients admitted to the hospital, discharge time will be determined by your treatment team.  Patients discharged the day of surgery will not be allowed to drive home.   Special instructions:    [facility]- Preparing For Surgery  Before surgery, you can play an important role. Because skin is not sterile, your skin needs to be as free of germs as possible. You can reduce the number of germs on your skin by washing with CHG (chlorahexidine gluconate) Soap before surgery.  CHG is an antiseptic cleaner which kills germs and bonds with the skin to continue killing germs even after washing.  Please do not use if you have an allergy to CHG or antibacterial soaps. If your skin becomes reddened/irritated stop using the  CHG.  Do not shave (including legs and underarms) for at least 48 hours prior to first CHG shower. It is OK to shave your face.  Please follow these instructions carefully.   1. Shower the NIGHT BEFORE SURGERY and the MORNING OF SURGERY with CHG.   2. If you chose to wash your hair, wash your hair first as usual with your normal shampoo.  3. After you shampoo, rinse your hair and body thoroughly to remove the shampoo.  4. Use CHG as you would any other liquid soap. You can apply CHG directly to the skin and wash gently with a scrungie or a clean washcloth.   5. Apply the CHG Soap to your body ONLY FROM THE NECK DOWN.  Do not use on open wounds or open sores. Avoid contact with your eyes, ears, mouth and genitals (private parts). Wash Face and genitals (private parts)  with your normal soap.  6. Wash thoroughly, paying special attention to the area where your surgery will be performed.  7. Thoroughly rinse your body with warm water from the neck down.  8. DO NOT shower/wash with your normal soap after using and rinsing off the CHG Soap.  9. Pat yourself dry with a CLEAN TOWEL.  10. Wear CLEAN PAJAMAS to bed the night before surgery, wear comfortable clothes the morning of surgery  11. Place CLEAN SHEETS on your bed the night of your first shower and DO NOT SLEEP WITH PETS.  Day of Surgery: Do not apply any deodorants/lotions. Please wear clean clothes to the hospital/surgery center.      Please read over the following fact sheets that you were given. Coughing and Deep Breathing and Surgical Site Infection Prevention

## 2017-04-14 ENCOUNTER — Encounter (HOSPITAL_COMMUNITY)
Admission: RE | Admit: 2017-04-14 | Discharge: 2017-04-14 | Disposition: A | Discharge status: Home / Self Care | Payer: BLUE CROSS/BLUE SHIELD | Source: Ambulatory Visit | Attending: Orthopedic Surgery | Admitting: Orthopedic Surgery

## 2017-04-14 ENCOUNTER — Encounter (HOSPITAL_COMMUNITY): Payer: Self-pay

## 2017-04-14 ENCOUNTER — Other Ambulatory Visit: Payer: Self-pay

## 2017-04-14 DIAGNOSIS — Z01818 Encounter for other preprocedural examination: Secondary | ICD-10-CM | POA: Diagnosis present

## 2017-04-14 DIAGNOSIS — M1611 Unilateral primary osteoarthritis, right hip: Secondary | ICD-10-CM | POA: Diagnosis not present

## 2017-04-14 LAB — CBC
HEMATOCRIT: 38.9 % (ref 36.0–46.0)
HEMOGLOBIN: 12.5 g/dL (ref 12.0–15.0)
MCH: 31.5 pg (ref 26.0–34.0)
MCHC: 32.1 g/dL (ref 30.0–36.0)
MCV: 98 fL (ref 78.0–100.0)
Platelets: 174 10*3/uL (ref 150–400)
RBC: 3.97 MIL/uL (ref 3.87–5.11)
RDW: 13.2 % (ref 11.5–15.5)
WBC: 5 10*3/uL (ref 4.0–10.5)

## 2017-04-14 LAB — TYPE AND SCREEN
ABO/RH(D): A POS
ANTIBODY SCREEN: NEGATIVE

## 2017-04-14 LAB — ABO/RH: ABO/RH(D): A POS

## 2017-04-14 LAB — SURGICAL PCR SCREEN
MRSA, PCR: NEGATIVE
STAPHYLOCOCCUS AUREUS: NEGATIVE

## 2017-04-14 NOTE — Progress Notes (Addendum)
PCP: Briscoe Deutscher, DO  Cardiologist: Dr. Charolette Forward  EKG: 03/29/17 in [facility]  Stress test: 07/27/05 in [facility]  ECHO: 09/25/13 in [facility]  Cardiac Cath: pt denies ever  Chest x-ray:03/30/17 in [facility]

## 2017-04-14 NOTE — Telephone Encounter (Signed)
MEDICATION:   PHARMACY:  [facility]   IS THIS A 90 DAY SUPPLY : no  IS PATIENT OUT OF MEDICATION:   IF NOT; HOW MUCH IS LEFT:   LAST APPOINTMENT DATE: @2 /06/2017  NEXT APPOINTMENT DATE:@Visit  date not found  OTHER COMMENTS:    **Let patient know to contact pharmacy at the end of the day to make sure medication is ready. **  ** Please notify patient to allow 48-72 hours to process**  **Encourage patient to contact the pharmacy for refills or they can request refills through [facility]**

## 2017-04-22 MED ORDER — SODIUM CHLORIDE 0.9 % IV SOLN: INTRAVENOUS | Status: DC

## 2017-04-22 MED ORDER — TRANEXAMIC ACID 1000 MG/10ML IV SOLN
1000.0000 mg | INTRAVENOUS | Status: AC
  Administered 2017-04-25: 1000 mg via INTRAVENOUS
  Filled 2017-04-22: qty 1100

## 2017-04-22 MED ORDER — ACETAMINOPHEN 10 MG/ML IV SOLN
1000.0000 mg | INTRAVENOUS | Status: AC
  Administered 2017-04-25: 1000 mg via INTRAVENOUS
  Filled 2017-04-22: qty 100

## 2017-04-22 MED ORDER — CLINDAMYCIN PHOSPHATE 900 MG/50ML IV SOLN
900.0000 mg | INTRAVENOUS | Status: AC
  Administered 2017-04-25: 900 mg via INTRAVENOUS
  Filled 2017-04-22: qty 50

## 2017-04-25 ENCOUNTER — Inpatient Hospital Stay (HOSPITAL_COMMUNITY): Payer: BLUE CROSS/BLUE SHIELD

## 2017-04-25 ENCOUNTER — Inpatient Hospital Stay (HOSPITAL_COMMUNITY)
Admission: RE | Admit: 2017-04-25 | Discharge: 2017-04-26 | DRG: 470 | Disposition: A | Discharge status: Home Health | Payer: BLUE CROSS/BLUE SHIELD | Source: Ambulatory Visit | Attending: Orthopedic Surgery | Admitting: Orthopedic Surgery

## 2017-04-25 ENCOUNTER — Inpatient Hospital Stay (HOSPITAL_COMMUNITY): Payer: BLUE CROSS/BLUE SHIELD | Admitting: Certified Registered"

## 2017-04-25 ENCOUNTER — Encounter (HOSPITAL_COMMUNITY)
Admission: RE | Disposition: A | Discharge status: Home Health | Payer: Self-pay | Source: Ambulatory Visit | Attending: Orthopedic Surgery

## 2017-04-25 ENCOUNTER — Encounter (HOSPITAL_COMMUNITY): Payer: Self-pay

## 2017-04-25 ENCOUNTER — Other Ambulatory Visit: Payer: Self-pay

## 2017-04-25 DIAGNOSIS — Z880 Allergy status to penicillin: Secondary | ICD-10-CM | POA: Diagnosis not present

## 2017-04-25 DIAGNOSIS — K589 Irritable bowel syndrome without diarrhea: Secondary | ICD-10-CM | POA: Diagnosis present

## 2017-04-25 DIAGNOSIS — Z885 Allergy status to narcotic agent status: Secondary | ICD-10-CM

## 2017-04-25 DIAGNOSIS — Z79899 Other long term (current) drug therapy: Secondary | ICD-10-CM | POA: Diagnosis not present

## 2017-04-25 DIAGNOSIS — D62 Acute posthemorrhagic anemia: Secondary | ICD-10-CM | POA: Diagnosis not present

## 2017-04-25 DIAGNOSIS — E785 Hyperlipidemia, unspecified: Secondary | ICD-10-CM | POA: Diagnosis present

## 2017-04-25 DIAGNOSIS — M1611 Unilateral primary osteoarthritis, right hip: Principal | ICD-10-CM | POA: Diagnosis present

## 2017-04-25 DIAGNOSIS — F410 Panic disorder [episodic paroxysmal anxiety] without agoraphobia: Secondary | ICD-10-CM | POA: Diagnosis present

## 2017-04-25 DIAGNOSIS — Z87891 Personal history of nicotine dependence: Secondary | ICD-10-CM | POA: Diagnosis not present

## 2017-04-25 DIAGNOSIS — Z915 Personal history of self-harm: Secondary | ICD-10-CM | POA: Diagnosis not present

## 2017-04-25 DIAGNOSIS — G4733 Obstructive sleep apnea (adult) (pediatric): Secondary | ICD-10-CM | POA: Diagnosis present

## 2017-04-25 DIAGNOSIS — Z9181 History of falling: Secondary | ICD-10-CM

## 2017-04-25 DIAGNOSIS — Z419 Encounter for procedure for purposes other than remedying health state, unspecified: Secondary | ICD-10-CM

## 2017-04-25 DIAGNOSIS — K219 Gastro-esophageal reflux disease without esophagitis: Secondary | ICD-10-CM | POA: Diagnosis present

## 2017-04-25 DIAGNOSIS — G2581 Restless legs syndrome: Secondary | ICD-10-CM | POA: Diagnosis present

## 2017-04-25 DIAGNOSIS — Z09 Encounter for follow-up examination after completed treatment for conditions other than malignant neoplasm: Secondary | ICD-10-CM

## 2017-04-25 HISTORY — DX: Unilateral primary osteoarthritis, right hip: M16.11

## 2017-04-25 HISTORY — PX: TOTAL HIP ARTHROPLASTY: SHX124

## 2017-04-25 SURGERY — ARTHROPLASTY, HIP, TOTAL, ANTERIOR APPROACH
Anesthesia: Spinal | Site: Hip | Laterality: Right

## 2017-04-25 MED ORDER — MIDAZOLAM HCL 5 MG/5ML IJ SOLN
INTRAMUSCULAR | Status: DC | PRN
  Administered 2017-04-25 (×2): 2 mg via INTRAVENOUS

## 2017-04-25 MED ORDER — KETOROLAC TROMETHAMINE 30 MG/ML IJ SOLN
INTRAMUSCULAR | Status: AC
  Filled 2017-04-25: qty 1

## 2017-04-25 MED ORDER — BUPIVACAINE IN DEXTROSE 0.75-8.25 % IT SOLN
INTRATHECAL | Status: DC | PRN
  Administered 2017-04-25: 2 mL via INTRATHECAL

## 2017-04-25 MED ORDER — POVIDONE-IODINE 10 % EX SWAB: 2.0000 "application " | Freq: Once | CUTANEOUS | Status: DC

## 2017-04-25 MED ORDER — CHLORHEXIDINE GLUCONATE 4 % EX LIQD: 60.0000 mL | Freq: Once | CUTANEOUS | Status: DC

## 2017-04-25 MED ORDER — METHOCARBAMOL 500 MG PO TABS
500.0000 mg | ORAL_TABLET | Freq: Four times a day (QID) | ORAL | Status: DC | PRN
  Administered 2017-04-25 – 2017-04-26 (×4): 500 mg via ORAL
  Filled 2017-04-25 (×4): qty 1

## 2017-04-25 MED ORDER — MIDAZOLAM HCL 2 MG/2ML IJ SOLN
INTRAMUSCULAR | Status: AC
  Filled 2017-04-25: qty 2

## 2017-04-25 MED ORDER — PROPOFOL 10 MG/ML IV BOLUS
INTRAVENOUS | Status: DC | PRN
  Administered 2017-04-25 (×2): 20 mg via INTRAVENOUS

## 2017-04-25 MED ORDER — NITROGLYCERIN 0.4 MG SL SUBL: 0.4000 mg | SUBLINGUAL_TABLET | SUBLINGUAL | Status: DC | PRN

## 2017-04-25 MED ORDER — ASPIRIN 81 MG PO CHEW
81.0000 mg | CHEWABLE_TABLET | Freq: Two times a day (BID) | ORAL | Status: DC
  Administered 2017-04-25 – 2017-04-26 (×2): 81 mg via ORAL
  Filled 2017-04-25 (×2): qty 1

## 2017-04-25 MED ORDER — DEXAMETHASONE SODIUM PHOSPHATE 10 MG/ML IJ SOLN
10.0000 mg | Freq: Once | INTRAMUSCULAR | Status: AC
  Administered 2017-04-26: 10 mg via INTRAVENOUS
  Filled 2017-04-25: qty 1

## 2017-04-25 MED ORDER — FAMOTIDINE 40 MG PO TABS
40.0000 mg | ORAL_TABLET | Freq: Every day | ORAL | Status: DC
  Administered 2017-04-25 – 2017-04-26 (×2): 40 mg via ORAL
  Filled 2017-04-25: qty 2
  Filled 2017-04-25 (×2): qty 1

## 2017-04-25 MED ORDER — BUPIVACAINE-EPINEPHRINE (PF) 0.5% -1:200000 IJ SOLN
INTRAMUSCULAR | Status: AC
  Filled 2017-04-25: qty 30

## 2017-04-25 MED ORDER — TOPIRAMATE 25 MG PO TABS
150.0000 mg | ORAL_TABLET | Freq: Every day | ORAL | Status: DC
  Administered 2017-04-25: 150 mg via ORAL
  Filled 2017-04-25: qty 2

## 2017-04-25 MED ORDER — ONDANSETRON HCL 4 MG/2ML IJ SOLN
INTRAMUSCULAR | Status: DC | PRN
  Administered 2017-04-25: 4 mg via INTRAVENOUS

## 2017-04-25 MED ORDER — DIPHENHYDRAMINE HCL 12.5 MG/5ML PO ELIX: 12.5000 mg | ORAL_SOLUTION | ORAL | Status: DC | PRN

## 2017-04-25 MED ORDER — FENTANYL CITRATE (PF) 250 MCG/5ML IJ SOLN
INTRAMUSCULAR | Status: DC | PRN
  Administered 2017-04-25: 50 ug via INTRAVENOUS

## 2017-04-25 MED ORDER — PANTOPRAZOLE SODIUM 40 MG PO TBEC
40.0000 mg | DELAYED_RELEASE_TABLET | Freq: Every evening | ORAL | Status: DC
  Administered 2017-04-25: 40 mg via ORAL
  Filled 2017-04-25: qty 1

## 2017-04-25 MED ORDER — FENTANYL CITRATE (PF) 250 MCG/5ML IJ SOLN
INTRAMUSCULAR | Status: AC
  Filled 2017-04-25: qty 5

## 2017-04-25 MED ORDER — LUBIPROSTONE 24 MCG PO CAPS
24.0000 ug | ORAL_CAPSULE | Freq: Two times a day (BID) | ORAL | Status: DC
  Administered 2017-04-25 – 2017-04-26 (×2): 24 ug via ORAL
  Filled 2017-04-25 (×4): qty 1

## 2017-04-25 MED ORDER — CLINDAMYCIN PHOSPHATE 600 MG/50ML IV SOLN
600.0000 mg | Freq: Four times a day (QID) | INTRAVENOUS | Status: AC
  Administered 2017-04-25 (×2): 600 mg via INTRAVENOUS
  Filled 2017-04-25 (×2): qty 50

## 2017-04-25 MED ORDER — PHENOL 1.4 % MT LIQD: 1.0000 | OROMUCOSAL | Status: DC | PRN

## 2017-04-25 MED ORDER — METHOCARBAMOL 1000 MG/10ML IJ SOLN: 500.0000 mg | Freq: Four times a day (QID) | INTRAMUSCULAR | Status: DC | PRN

## 2017-04-25 MED ORDER — ALUM & MAG HYDROXIDE-SIMETH 200-200-20 MG/5ML PO SUSP: 30.0000 mL | ORAL | Status: DC | PRN

## 2017-04-25 MED ORDER — PROPOFOL 500 MG/50ML IV EMUL
INTRAVENOUS | Status: DC | PRN
  Administered 2017-04-25: 75 ug/kg/min via INTRAVENOUS

## 2017-04-25 MED ORDER — ALPRAZOLAM 0.5 MG PO TABS
0.5000 mg | ORAL_TABLET | Freq: Every evening | ORAL | Status: DC | PRN
  Administered 2017-04-26 (×2): 0.5 mg via ORAL
  Filled 2017-04-25 (×2): qty 1

## 2017-04-25 MED ORDER — SODIUM CHLORIDE 0.9 % IV SOLN
INTRAVENOUS | Status: DC
  Administered 2017-04-25: 17:00:00 via INTRAVENOUS

## 2017-04-25 MED ORDER — PHENYLEPHRINE HCL 10 MG/ML IJ SOLN
INTRAVENOUS | Status: DC | PRN
  Administered 2017-04-25: 15 ug/min via INTRAVENOUS

## 2017-04-25 MED ORDER — ROPINIROLE HCL 1 MG PO TABS
2.0000 mg | ORAL_TABLET | Freq: Every day | ORAL | Status: DC
  Administered 2017-04-25: 2 mg via ORAL
  Filled 2017-04-25: qty 2

## 2017-04-25 MED ORDER — DULOXETINE HCL 60 MG PO CPEP
60.0000 mg | ORAL_CAPSULE | Freq: Every day | ORAL | Status: DC
  Administered 2017-04-25: 60 mg via ORAL
  Filled 2017-04-25: qty 1

## 2017-04-25 MED ORDER — METOCLOPRAMIDE HCL 5 MG PO TABS: 5.0000 mg | ORAL_TABLET | Freq: Three times a day (TID) | ORAL | Status: DC | PRN

## 2017-04-25 MED ORDER — ONDANSETRON HCL 4 MG/2ML IJ SOLN
4.0000 mg | Freq: Four times a day (QID) | INTRAMUSCULAR | Status: DC | PRN
  Administered 2017-04-26: 4 mg via INTRAVENOUS
  Filled 2017-04-25: qty 2

## 2017-04-25 MED ORDER — HYDROCODONE-ACETAMINOPHEN 7.5-325 MG PO TABS
1.0000 | ORAL_TABLET | Freq: Four times a day (QID) | ORAL | Status: DC | PRN
  Administered 2017-04-25 – 2017-04-26 (×4): 1 via ORAL
  Filled 2017-04-25 (×4): qty 1

## 2017-04-25 MED ORDER — ONDANSETRON HCL 4 MG/2ML IJ SOLN
INTRAMUSCULAR | Status: AC
  Filled 2017-04-25: qty 2

## 2017-04-25 MED ORDER — LINACLOTIDE 145 MCG PO CAPS
290.0000 ug | ORAL_CAPSULE | Freq: Every day | ORAL | Status: DC
  Administered 2017-04-26: 290 ug via ORAL
  Filled 2017-04-25 (×2): qty 2

## 2017-04-25 MED ORDER — KETOROLAC TROMETHAMINE 30 MG/ML IJ SOLN
INTRAMUSCULAR | Status: DC | PRN
  Administered 2017-04-25: 30 mg via INTRAMUSCULAR

## 2017-04-25 MED ORDER — BUPIVACAINE-EPINEPHRINE (PF) 0.5% -1:200000 IJ SOLN
INTRAMUSCULAR | Status: DC | PRN
  Administered 2017-04-25: 30 mL

## 2017-04-25 MED ORDER — METOCLOPRAMIDE HCL 5 MG/ML IJ SOLN: 5.0000 mg | Freq: Three times a day (TID) | INTRAMUSCULAR | Status: DC | PRN

## 2017-04-25 MED ORDER — DOCUSATE SODIUM 100 MG PO CAPS
100.0000 mg | ORAL_CAPSULE | Freq: Two times a day (BID) | ORAL | Status: DC
  Administered 2017-04-25 – 2017-04-26 (×2): 100 mg via ORAL
  Filled 2017-04-25 (×3): qty 1

## 2017-04-25 MED ORDER — DEXAMETHASONE SODIUM PHOSPHATE 10 MG/ML IJ SOLN
INTRAMUSCULAR | Status: AC
  Filled 2017-04-25: qty 1

## 2017-04-25 MED ORDER — ONDANSETRON HCL 4 MG PO TABS: 4.0000 mg | ORAL_TABLET | Freq: Four times a day (QID) | ORAL | Status: DC | PRN

## 2017-04-25 MED ORDER — DEXAMETHASONE SODIUM PHOSPHATE 10 MG/ML IJ SOLN
INTRAMUSCULAR | Status: DC | PRN
  Administered 2017-04-25: 5 mg via INTRAVENOUS

## 2017-04-25 MED ORDER — 0.9 % SODIUM CHLORIDE (POUR BTL) OPTIME
TOPICAL | Status: DC | PRN
  Administered 2017-04-25: 1000 mL

## 2017-04-25 MED ORDER — SUMATRIPTAN SUCCINATE 100 MG PO TABS: 100.0000 mg | ORAL_TABLET | ORAL | Status: DC | PRN

## 2017-04-25 MED ORDER — POLYETHYLENE GLYCOL 3350 17 G PO PACK: 17.0000 g | PACK | Freq: Every day | ORAL | Status: DC | PRN

## 2017-04-25 MED ORDER — HYDROMORPHONE HCL 1 MG/ML IJ SOLN
0.5000 mg | INTRAMUSCULAR | Status: DC | PRN
Start: 2017-04-25 — End: 2017-04-26
  Administered 2017-04-25 – 2017-04-26 (×4): 0.5 mg via INTRAVENOUS
  Filled 2017-04-25 (×4): qty 1

## 2017-04-25 MED ORDER — MENTHOL 3 MG MT LOZG: 1.0000 | LOZENGE | OROMUCOSAL | Status: DC | PRN

## 2017-04-25 MED ORDER — LINACLOTIDE 290 MCG PO CAPS
290.0000 ug | ORAL_CAPSULE | Freq: Every day | ORAL | Status: DC
  Filled 2017-04-25: qty 1

## 2017-04-25 MED ORDER — LACTATED RINGERS IV SOLN
INTRAVENOUS | Status: DC
  Administered 2017-04-25 (×2): via INTRAVENOUS

## 2017-04-25 SURGICAL SUPPLY — 55 items
ADH SKN CLS APL DERMABOND .7 (GAUZE/BANDAGES/DRESSINGS) ×1
ALCOHOL ISOPROPYL (RUBBING) (MISCELLANEOUS) ×2 IMPLANT
BLADE CLIPPER SURG (BLADE) IMPLANT
CAPT HIP TOTAL 2 ×1 IMPLANT
CHLORAPREP W/TINT 26ML (MISCELLANEOUS) ×2 IMPLANT
COVER SURGICAL LIGHT HANDLE (MISCELLANEOUS) ×2 IMPLANT
DERMABOND ADVANCED (GAUZE/BANDAGES/DRESSINGS) ×1
DERMABOND ADVANCED .7 DNX12 (GAUZE/BANDAGES/DRESSINGS) ×1 IMPLANT
DRAPE C-ARM 42X72 X-RAY (DRAPES) ×2 IMPLANT
DRAPE STERI IOBAN 125X83 (DRAPES) ×2 IMPLANT
DRAPE U-SHAPE 47X51 STRL (DRAPES) ×6 IMPLANT
DRSG AQUACEL AG ADV 3.5X10 (GAUZE/BANDAGES/DRESSINGS) ×2 IMPLANT
ELECT BLADE 4.0 EZ CLEAN MEGAD (MISCELLANEOUS) ×2
ELECT PENCIL ROCKER SW 15FT (MISCELLANEOUS) ×2 IMPLANT
ELECT REM PT RETURN 9FT ADLT (ELECTROSURGICAL) ×2
ELECTRODE BLDE 4.0 EZ CLN MEGD (MISCELLANEOUS) ×1 IMPLANT
ELECTRODE REM PT RTRN 9FT ADLT (ELECTROSURGICAL) ×1 IMPLANT
EVACUATOR 1/8 PVC DRAIN (DRAIN) IMPLANT
GLOVE BIO SURGEON STRL SZ8.5 (GLOVE) ×4 IMPLANT
GLOVE BIOGEL PI IND STRL 8.5 (GLOVE) ×1 IMPLANT
GLOVE BIOGEL PI INDICATOR 8.5 (GLOVE) ×1
GOWN STRL REUS W/ TWL LRG LVL3 (GOWN DISPOSABLE) ×2 IMPLANT
GOWN STRL REUS W/TWL 2XL LVL3 (GOWN DISPOSABLE) ×3 IMPLANT
GOWN STRL REUS W/TWL LRG LVL3 (GOWN DISPOSABLE) ×4
HANDPIECE INTERPULSE COAX TIP (DISPOSABLE) ×2
HOOD PEEL AWAY FACE SHEILD DIS (HOOD) ×4 IMPLANT
KIT BASIN OR (CUSTOM PROCEDURE TRAY) ×2 IMPLANT
KIT ROOM TURNOVER OR (KITS) ×2 IMPLANT
MANIFOLD NEPTUNE II (INSTRUMENTS) ×2 IMPLANT
MARKER SKIN DUAL TIP RULER LAB (MISCELLANEOUS) ×4 IMPLANT
NDL SPNL 18GX3.5 QUINCKE PK (NEEDLE) ×1 IMPLANT
NEEDLE SPNL 18GX3.5 QUINCKE PK (NEEDLE) ×4 IMPLANT
NS IRRIG 1000ML POUR BTL (IV SOLUTION) ×2 IMPLANT
PACK TOTAL JOINT (CUSTOM PROCEDURE TRAY) ×2 IMPLANT
PACK UNIVERSAL I (CUSTOM PROCEDURE TRAY) ×2 IMPLANT
PAD ARMBOARD 7.5X6 YLW CONV (MISCELLANEOUS) ×4 IMPLANT
SAW OSC TIP CART 19.5X105X1.3 (SAW) ×4 IMPLANT
SEALER BIPOLAR AQUA 6.0 (INSTRUMENTS) IMPLANT
SET HNDPC FAN SPRY TIP SCT (DISPOSABLE) ×1 IMPLANT
SOL PREP POV-IOD 4OZ 10% (MISCELLANEOUS) ×2 IMPLANT
SUT ETHIBOND NAB CT1 #1 30IN (SUTURE) ×4 IMPLANT
SUT MNCRL AB 3-0 PS2 18 (SUTURE) ×2 IMPLANT
SUT MON AB 2-0 CT1 36 (SUTURE) ×2 IMPLANT
SUT VIC AB 1 CT1 27 (SUTURE) ×2
SUT VIC AB 1 CT1 27XBRD ANBCTR (SUTURE) ×1 IMPLANT
SUT VIC AB 2-0 CT1 27 (SUTURE) ×2
SUT VIC AB 2-0 CT1 TAPERPNT 27 (SUTURE) ×1 IMPLANT
SUT VLOC 180 0 24IN GS25 (SUTURE) ×2 IMPLANT
SYR 50ML LL SCALE MARK (SYRINGE) ×2 IMPLANT
SYRINGE 60CC LL (MISCELLANEOUS) ×1 IMPLANT
TOWEL OR 17X24 6PK STRL BLUE (TOWEL DISPOSABLE) ×2 IMPLANT
TOWEL OR 17X26 10 PK STRL BLUE (TOWEL DISPOSABLE) ×2 IMPLANT
TRAY CATH 16FR W/PLASTIC CATH (SET/KITS/TRAYS/PACK) IMPLANT
TRAY FOLEY CATH SILVER 16FR (SET/KITS/TRAYS/PACK) IMPLANT
WATER STERILE IRR 1000ML POUR (IV SOLUTION) ×6 IMPLANT

## 2017-04-25 NOTE — Progress Notes (Signed)
Pt refusing meds in pacu, just wants to go to [facility]rom and take meds

## 2017-04-25 NOTE — Plan of Care (Signed)
  Nutrition: Adequate nutrition will be maintained 04/25/2017 1837 - Progressing by Williams Che, RN   Elimination: Will not experience complications related to bowel motility 04/25/2017 1837 - Progressing by Williams Che, RN   Pain Managment: General experience of comfort will improve 04/25/2017 1837 - Progressing by Williams Che, RN   Safety: Ability to remain free from injury will improve 04/25/2017 1837 - Progressing by Williams Che, RN

## 2017-04-25 NOTE — Progress Notes (Signed)
Orthopedic Tech Progress Note Patient Details:  [name] White 18-Nov-1965 [id number]  Ortho Devices Ortho Device/Splint Location: Trapeze bar Ortho Device/Splint Interventions: Adjustment   Post Interventions Patient Tolerated: Well   [name] 04/25/2017, 5:35 PM

## 2017-04-25 NOTE — Discharge Instructions (Signed)
°Dr. Erielle Gawronski °Joint Replacement Specialist °[facility] °[address] °[place], [place] [address] °[phone] ° ° °TOTAL HIP REPLACEMENT POSTOPERATIVE DIRECTIONS ° ° ° °Hip Rehabilitation, Guidelines Following Surgery  ° °WEIGHT BEARING °Weight bearing as tolerated with assist device (walker, cane, etc) as directed, use it as long as suggested by your surgeon or therapist, typically at least 4-6 weeks. ° °The results of a hip operation are greatly improved after range of motion and muscle strengthening exercises. Follow all safety measures which are given to protect your hip. If any of these exercises cause increased pain or swelling in your joint, decrease the amount until you are comfortable again. Then slowly increase the exercises. Call your caregiver if you have problems or questions.  ° °HOME CARE INSTRUCTIONS  °Most of the following instructions are designed to prevent the dislocation of your new hip.  °Remove items at home which could result in a fall. This includes throw rugs or furniture in walking pathways.  °Continue medications as instructed at time of discharge. °· You may have some home medications which will be placed on hold until you complete the course of blood thinner medication. °· You may start showering once you are discharged home. Do not remove your dressing. °Do not put on socks or shoes without following the instructions of your caregivers.   °Sit on chairs with arms. Use the chair arms to help push yourself up when arising.  °Arrange for the use of a toilet seat elevator so you are not sitting low.  °· Walk with walker as instructed.  °You may resume a sexual relationship in one month or when given the OK by your caregiver.  °Use walker as long as suggested by your caregivers.  °You may put full weight on your legs and walk as much as is comfortable. °Avoid periods of inactivity such as sitting longer than an hour when not asleep. This helps prevent  blood clots.  °You may return to work once you are cleared by your surgeon.  °Do not drive a car for 6 weeks or until released by your surgeon.  °Do not drive while taking narcotics.  °Wear elastic stockings for two weeks following surgery during the day but you may remove then at night.  °Make sure you keep all of your appointments after your operation with all of your doctors and caregivers. You should call the office at the above phone number and make an appointment for approximately two weeks after the date of your surgery. °Please pick up a stool softener and laxative for home use as long as you are requiring pain medications. °· ICE to the affected hip every three hours for 30 minutes at a time and then as needed for pain and swelling. Continue to use ice on the hip for pain and swelling from surgery. You may notice swelling that will progress down to the foot and ankle.  This is normal after surgery.  Elevate the leg when you are not up walking on it.   °It is important for you to complete the blood thinner medication as prescribed by your doctor. °· Continue to use the breathing machine which will help keep your temperature down.  It is common for your temperature to cycle up and down following surgery, especially at night when you are not up moving around and exerting yourself.  The breathing machine keeps your lungs expanded and your temperature down. ° °RANGE OF MOTION AND STRENGTHENING EXERCISES  °These exercises are   designed to help you keep full movement of your hip joint. Follow your caregiver's or physical therapist's instructions. Perform all exercises about fifteen times, three times per day or as directed. Exercise both hips, even if you have had only one joint replacement. These exercises can be done on a training (exercise) mat, on the floor, on a table or on a bed. Use whatever works the best and is most comfortable for you. Use music or television while you are exercising so that the exercises  are a pleasant break in your day. This will make your life better with the exercises acting as a break in routine you can look forward to.  °Lying on your back, slowly slide your foot toward your buttocks, raising your knee up off the floor. Then slowly slide your foot back down until your leg is straight again.  °Lying on your back spread your legs as far apart as you can without causing discomfort.  °Lying on your side, raise your upper leg and foot straight up from the floor as far as is comfortable. Slowly lower the leg and repeat.  °Lying on your back, tighten up the muscle in the front of your thigh (quadriceps muscles). You can do this by keeping your leg straight and trying to raise your heel off the floor. This helps strengthen the largest muscle supporting your knee.  °Lying on your back, tighten up the muscles of your buttocks both with the legs straight and with the knee bent at a comfortable angle while keeping your heel on the floor.  ° °SKILLED REHAB INSTRUCTIONS: °If the patient is transferred to a skilled rehab facility following release from the hospital, a list of the current medications will be sent to the facility for the patient to continue.  When discharged from the skilled rehab facility, please have the facility set up the patient's Home Health Physical Therapy prior to being released. Also, the skilled facility will be responsible for providing the patient with their medications at time of release from the facility to include their pain medication and their blood thinner medication. If the patient is still at the rehab facility at time of the two week follow up appointment, the skilled rehab facility will also need to assist the patient in arranging follow up appointment in our office and any transportation needs. ° °MAKE SURE YOU:  °Understand these instructions.  °Will watch your condition.  °Will get help right away if you are not doing well or get worse. ° °Pick up stool softner and  laxative for home use following surgery while on pain medications. °Do not remove your dressing. °The dressing is waterproof--it is OK to take showers. °Continue to use ice for pain and swelling after surgery. °Do not use any lotions or creams on the incision until instructed by your surgeon. °Total Hip Protocol. ° ° °

## 2017-04-25 NOTE — Evaluation (Signed)
Physical Therapy Evaluation Patient Details Name: [name] White MRN: [id number] DOB: Nov 12, 1965 Today's Date: 04/25/2017   History of Present Illness  Pt is a 52 y/o female s/p elective R THA, direct anterior approach. PMH inlucdes depression, OSA, and tobacco abuse.   Clinical Impression  Pt is s/p surgery above with deficits below. Pt tolerated gait within the room well and required min to min guard assist for mobility. Supine HEP initiated. Will continue to follow acutely to maximize functional mobility independence and safety.     Follow Up Recommendations Follow surgeon's recommendation for DC plan and follow-up therapies;Supervision for mobility/OOB    Equipment Recommendations  Rolling walker with 5" wheels;3in1 (PT);Other (comment)(shower chair )    Recommendations for Other Services       Precautions / Restrictions Precautions Precautions: None Restrictions Weight Bearing Restrictions: Yes RLE Weight Bearing: Weight bearing as tolerated      Mobility  Bed Mobility Overal bed mobility: Needs Assistance Bed Mobility: Supine to Sit     Supine to sit: Min assist     General bed mobility comments: Min A for RLE management and trunk elevation. Increased time required secondary to pain.   Transfers Overall transfer level: Needs assistance Equipment used: Rolling walker (2 wheeled) Transfers: Sit to/from Stand Sit to Stand: Min assist         General transfer comment: Min A for lift assist and steadying. Verbal cues for safe hand placement.   Ambulation/Gait Ambulation/Gait assistance: Min guard Ambulation Distance (Feet): 25 Feet Assistive device: Rolling walker (2 wheeled) Gait Pattern/deviations: Step-to pattern;Decreased step length - right;Decreased step length - left;Decreased weight shift to right;Antalgic Gait velocity: Decreased  Gait velocity interpretation: Below normal speed for age/gender General Gait Details: Slow, antalgic gait. Verbal cues for  sequencing using RW. Pt wanted to stay in the room for gait training.   Stairs            Wheelchair Mobility    Modified Rankin (Stroke Patients Only)       Balance Overall balance assessment: Needs assistance Sitting-balance support: No upper extremity supported;Feet supported Sitting balance-Leahy Scale: Good     Standing balance support: Bilateral upper extremity supported;During functional activity;No upper extremity supported Standing balance-Leahy Scale: Fair Standing balance comment: Able to maintain static standing at sink without UE support.                              Pertinent Vitals/Pain Pain Assessment: Faces Faces Pain Scale: Hurts even more Pain Location: R hip Pain Descriptors / Indicators: Aching;Constant;Operative site guarding Pain Intervention(s): Limited activity within patient's tolerance;Monitored during session;Repositioned    Home Living Family/patient expects to be discharged to:: Private residence Living Arrangements: Other relatives(brother ) Available Help at Discharge: Family;Available 24 hours/day Type of Home: House Home Access: Ramped entrance     Home Layout: One level Home Equipment: Cane - single point      Prior Function Level of Independence: Independent with assistive device(s)         Comments: Used cane occasionally.      Hand Dominance        Extremity/Trunk Assessment   Upper Extremity Assessment Upper Extremity Assessment: Overall WFL for tasks assessed    Lower Extremity Assessment Lower Extremity Assessment: RLE deficits/detail RLE Deficits / Details: Reports some decreased sensation. Able to perform ther ex below. Deficits consistent with post op pain and weakness.     Cervical / Trunk Assessment  Cervical / Trunk Assessment: Normal  Communication   Communication: No difficulties  Cognition Arousal/Alertness: Awake/alert Behavior During Therapy: WFL for tasks  assessed/performed Overall Cognitive Status: Within Functional Limits for tasks assessed                                        General Comments General comments (skin integrity, edema, etc.): Pt's daughter present during session     Exercises Total Joint Exercises Ankle Circles/Pumps: AROM;Both;20 reps Quad Sets: AROM;Right;10 reps Heel Slides: AROM;Right;5 reps   Assessment/Plan    PT Assessment Patient needs continued PT services  PT Problem List Decreased strength;Decreased range of motion;Decreased activity tolerance;Decreased balance;Decreased mobility;Decreased knowledge of use of DME;Decreased knowledge of precautions;Pain;Impaired sensation       PT Treatment Interventions DME instruction;Gait training;Stair training;Therapeutic exercise;Therapeutic activities;Functional mobility training;Balance training;Neuromuscular re-education;Patient/family education    PT Goals (Current goals can be found in the Care Plan section)  Acute Rehab PT Goals Patient Stated Goal: to go home tomorrow  PT Goal Formulation: With patient Time For Goal Achievement: 05/09/17 Potential to Achieve Goals: Good    Frequency 7X/week   Barriers to discharge        Co-evaluation               AM-PAC PT "6 Clicks" Daily Activity  Outcome Measure Difficulty turning over in bed (including adjusting bedclothes, sheets and blankets)?: A Little Difficulty moving from lying on back to sitting on the side of the bed? : Unable Difficulty sitting down on and standing up from a chair with arms (e.g., wheelchair, bedside commode, etc,.)?: Unable Help needed moving to and from a bed to chair (including a wheelchair)?: A Little Help needed walking in hospital room?: A Little Help needed climbing 3-5 steps with a railing? : A Little 6 Click Score: 14    End of Session Equipment Utilized During Treatment: Gait belt Activity Tolerance: Patient tolerated treatment well Patient left:  in chair;with call bell/phone within reach;with family/visitor present Nurse Communication: Mobility status;Other (comment)(pt voided ) PT Visit Diagnosis: Other abnormalities of gait and mobility (R26.89);Pain Pain - Right/Left: Right Pain - part of body: Hip    Time: 3295-1884 PT Time Calculation (min) (ACUTE ONLY): 38 min   Charges:   PT Evaluation $PT Eval Low Complexity: 1 Low PT Treatments $Gait Training: 8-22 mins $Therapeutic Activity: 8-22 mins   PT G Codes:        Leighton Ruff, PT, DPT  Acute Rehabilitation Services  Pager: [phone]   Rudean Hitt 04/25/2017, 6:58 PM

## 2017-04-25 NOTE — Anesthesia Procedure Notes (Signed)
Spinal  Patient location during procedure: OR Start time: 04/25/2017 12:06 PM End time: 04/25/2017 12:10 PM Staffing Anesthesiologist: Audry Pili, MD Performed: anesthesiologist  Preanesthetic Checklist Completed: patient identified, surgical consent, pre-op evaluation, timeout performed, IV checked, risks and benefits discussed and monitors and equipment checked Spinal Block Patient position: sitting Prep: DuraPrep Patient monitoring: heart rate, cardiac monitor, continuous pulse ox and blood pressure Approach: midline Location: L3-4 Injection technique: single-shot Needle Needle type: Pencan  Needle gauge: 24 G Additional Notes Functioning IV was confirmed and monitors were applied. Sterile prep and drape, including hand hygiene, mask, and sterile gloves were used. The patient was positioned and the spine was prepped. The skin was anesthetized with lidocaine. Free flow of clear CSF was obtained prior to injecting local anesthetic into the CSF. The spinal needle aspirated freely following injection. The needle was carefully withdrawn. The patient tolerated the procedure well. Consent was obtained prior to the procedure with all questions answered and concerns addressed. Risks including, but not limited to, bleeding, infection, nerve damage, paralysis, failed block, inadequate analgesia, allergic reaction, high spinal, itching, and headache were discussed and the patient wished to proceed.  [name] Don, MD

## 2017-04-25 NOTE — Transfer of Care (Signed)
Immediate Anesthesia Transfer of Care Note  Patient: [name] White  Procedure(s) Performed: RIGHT TOTAL HIP ARTHROPLASTY ANTERIOR APPROACH (Right Hip)  Patient Location: PACU  Anesthesia Type:Spinal  Level of Consciousness: drowsy and patient cooperative  Airway & Oxygen Therapy: Patient Spontanous Breathing and Patient connected to face mask oxygen  Post-op Assessment: Report given to RN and Post -op Vital signs reviewed and stable  Post vital signs: Reviewed and stable  Last Vitals:  Vitals:   04/25/17 0957  BP: 106/61  Pulse: 75  Resp: 18  Temp: 36.8 C  SpO2: 98%    Last Pain:  Vitals:   04/25/17 1112  TempSrc:   PainSc: 8       Patients Stated Pain Goal: 3 (40/76/80 8811)  Complications: No apparent anesthesia complications

## 2017-04-25 NOTE — Anesthesia Preprocedure Evaluation (Addendum)
Anesthesia Evaluation  Patient identified by MRN, date of birth, ID band Patient awake    Reviewed: Allergy & Precautions, H&P , NPO status , Patient's Chart, lab work & pertinent test results  Airway Mallampati: II  TM Distance: >3 FB Neck ROM: full    Dental no notable dental hx.    Pulmonary sleep apnea and Continuous Positive Airway Pressure Ventilation , former smoker,    Pulmonary exam normal breath sounds clear to auscultation       Cardiovascular Exercise Tolerance: Good negative cardio ROS   Rhythm:Regular Rate:Normal     Neuro/Psych  Headaches, Anxiety Depression RLS    GI/Hepatic Neg liver ROS, GERD  Controlled and Medicated,Barrett's esophagus; IBS   Endo/Other  negative endocrine ROS  Renal/GU negative Renal ROS     Musculoskeletal  (+) Arthritis ,   Abdominal   Peds  Hematology negative hematology ROS (+)   Anesthesia Other Findings   Reproductive/Obstetrics                           Anesthesia Physical Anesthesia Plan  ASA: II  Anesthesia Plan: Spinal   Post-op Pain Management:    Induction:   PONV Risk Score and Plan: 2 and Dexamethasone, Ondansetron and Treatment may vary due to age or medical condition  Airway Management Planned:   Additional Equipment:   Intra-op Plan:   Post-operative Plan:   Informed Consent: I have reviewed the patients History and Physical, chart, labs and discussed the procedure including the risks, benefits and alternatives for the proposed anesthesia with the patient or authorized representative who has indicated his/her understanding and acceptance.     Plan Discussed with:   Anesthesia Plan Comments: (  )        Anesthesia Quick Evaluation

## 2017-04-25 NOTE — Interval H&P Note (Signed)
History and Physical Interval Note:  04/25/2017 11:53 AM  [name] White  has presented today for surgery, with the diagnosis of Degenerative joint disease right hip  The various methods of treatment have been discussed with the patient and family. After consideration of risks, benefits and other options for treatment, the patient has consented to  Procedure(s) with comments: RIGHT TOTAL HIP ARTHROPLASTY ANTERIOR APPROACH (Right) - Needs RNFA as a surgical intervention .  The patient's history has been reviewed, patient examined, no change in status, stable for surgery.  I have reviewed the patient's chart and labs.  Questions were answered to the patient's satisfaction.     [name]

## 2017-04-25 NOTE — Op Note (Signed)
OPERATIVE REPORT  SURGEON: Rod Can, MD   ASSISTANT: Ky Barban, RNFA.  PREOPERATIVE DIAGNOSIS: Right hip arthritis.   POSTOPERATIVE DIAGNOSIS: Right hip arthritis.   PROCEDURE: Right total hip arthroplasty, anterior approach.   IMPLANTS: DePuy Tri Lock stem, size 6, std offset. DePuy Pinnacle Cup, size 52 mm. DePuy Altrx liner, size 32 by 52 mm, neutral. DePuy Biolox ceramic head ball, size 32 + 1 mm.  ANESTHESIA:  Spinal  ESTIMATED BLOOD LOSS:-400 mL    ANTIBIOTICS: 600 mg clindamycin.  DRAINS: None.  COMPLICATIONS: None.   CONDITION: PACU - hemodynamically stable.   BRIEF CLINICAL NOTE: [name] White is a 52 y.o. female with a long-standing history of Right hip arthritis. After failing conservative management, the patient was indicated for total hip arthroplasty. The risks, benefits, and alternatives to the procedure were explained, and the patient elected to proceed.  PROCEDURE IN DETAIL: Surgical site was marked by myself in the pre-op holding area. Once inside the operating room, spinal anesthesia was obtained, and a foley catheter was inserted. The patient was then positioned on the Hana table. All bony prominences were well padded. The hip was prepped and draped in the normal sterile surgical fashion. A time-out was called verifying side and site of surgery. The patient received IV antibiotics within 60 minutes of beginning the procedure.  The direct anterior approach to the hip was performed through the Hueter interval. Lateral femoral circumflex vessels were treated with the Auqumantys. The anterior capsule was exposed and an inverted T capsulotomy was made.The femoral neck cut was made to the level of the templated cut. A corkscrew was placed into the head and the head was removed. The femoral head was found to have eburnated bone. The head was passed to the back table and was measured.  Acetabular exposure was achieved, and the pulvinar and  labrum were excised. Sequential reaming of the acetabulum was then performed up to a size 51 mm reamer. A 52 mm cup was then opened and impacted into place at approximately 40 degrees of abduction and 20 degrees of anteversion. The final polyethylene liner was impacted into place and acetabular osteophytes were removed.   I then gained femoral exposure taking care to protect the abductors and greater trochanter. This was performed using standard external rotation, extension, and adduction. The capsule was peeled off the inner aspect of the greater trochanter, taking care to preserve the short external rotators. A cookie cutter was used to enter the femoral canal, and then the femoral canal finder was placed. Sequential broaching was performed up to a size 4. Calcar planer was used on the femoral neck remnant. I placed a std offset neck and a trial head ball. The hip was reduced. Leg lengths and offset were checked fluoroscopically. While her preop leg length was re-created, soft tissue tension was inadequate, and she had multidirectional hip instability.  Therefore, I plan to increase the broach size.  The hip was dislocated and trial components were removed. I then broached up to a size 6.  Trial neck and head were replaced.  The hip was reduced.  The hip was stable.  The final implants were placed, and the hip was reduced.  Fluoroscopy was used to confirm component position and leg lengths. At 90 degrees of external rotation and full extension, the hip was stable to an anterior directed force.  Of note, lengthening was required to regain appropriate soft tissue tension and stability.  The wound was copiously irrigated with normal saline using  pulse lavage. Marcaine solution was injected into the periarticular soft tissue. The wound was closed in layers using #1 Vicryl and V-Loc for the fascia, 2-0 Vicryl for the subcutaneous fat, 2-0 Monocryl for the deep dermal layer, 3-0 running Monocryl  subcuticular stitch, and Dermabond for the skin. Once the glue was fully dried, an Aquacell Ag dressing was applied. The patient was transported to the recovery room in stable condition. Sponge, needle, and instrument counts were correct at the end of the Griess x2. The patient tolerated the procedure well and there were no known complications.

## 2017-04-26 ENCOUNTER — Encounter (HOSPITAL_COMMUNITY): Payer: Self-pay | Admitting: Orthopedic Surgery

## 2017-04-26 LAB — BASIC METABOLIC PANEL
Anion gap: 7 (ref 5–15)
BUN: 12 mg/dL (ref 6–20)
CHLORIDE: 109 mmol/L (ref 101–111)
CO2: 25 mmol/L (ref 22–32)
Calcium: 8.7 mg/dL — ABNORMAL LOW (ref 8.9–10.3)
Creatinine, Ser: 0.85 mg/dL (ref 0.44–1.00)
GFR calc Af Amer: >60 mL/min (ref >60)
GFR calc non Af Amer: >60 mL/min (ref >60)
Glucose, Bld: 96 mg/dL (ref 65–99)
Potassium: 3.7 mmol/L (ref 3.5–5.1)
SODIUM: 141 mmol/L (ref 135–145)

## 2017-04-26 LAB — CBC
HCT: 31.2 % — ABNORMAL LOW (ref 36.0–46.0)
HEMOGLOBIN: 10.1 g/dL — AB (ref 12.0–15.0)
MCH: 32.2 pg (ref 26.0–34.0)
MCHC: 32.4 g/dL (ref 30.0–36.0)
MCV: 99.4 fL (ref 78.0–100.0)
Platelets: 182 10*3/uL (ref 150–400)
RBC: 3.14 MIL/uL — ABNORMAL LOW (ref 3.87–5.11)
RDW: 13.7 % (ref 11.5–15.5)
WBC: 8.6 10*3/uL (ref 4.0–10.5)

## 2017-04-26 MED ORDER — SENNA 8.6 MG PO TABS: 2.0000 | ORAL_TABLET | Freq: Every day | ORAL | 3 refills | Status: DC

## 2017-04-26 MED ORDER — SODIUM CHLORIDE 0.9 % IV BOLUS (SEPSIS)
1000.0000 mL | Freq: Once | INTRAVENOUS | Status: AC
  Administered 2017-04-26: 1000 mL via INTRAVENOUS

## 2017-04-26 MED ORDER — DOCUSATE SODIUM 100 MG PO CAPS: 100.0000 mg | ORAL_CAPSULE | Freq: Two times a day (BID) | ORAL | 1 refills | Status: DC

## 2017-04-26 MED ORDER — METHOCARBAMOL 500 MG PO TABS: 500.0000 mg | ORAL_TABLET | Freq: Four times a day (QID) | ORAL | 0 refills | Status: DC | PRN

## 2017-04-26 MED ORDER — HYDROCODONE-ACETAMINOPHEN 7.5-325 MG PO TABS: 1.0000 | ORAL_TABLET | Freq: Four times a day (QID) | ORAL | 0 refills | Status: DC | PRN

## 2017-04-26 MED ORDER — ASPIRIN 81 MG PO CHEW: 81.0000 mg | CHEWABLE_TABLET | Freq: Two times a day (BID) | ORAL | 1 refills | Status: DC

## 2017-04-26 NOTE — Progress Notes (Signed)
    Subjective:  Patient reports pain as mild to moderate.  Denies N/V/CP/SOB. No c/o  Objective:   VITALS:   Vitals:   04/25/17 1533 04/25/17 1938 04/26/17 0448 04/26/17 0726  BP: 102/68 (!) 103/54 (!) 87/42 (!) 92/54  Pulse: (!) 56 (!) 57 61   Resp: 16 16 15    Temp: (!) 97.4 F (36.3 C) 98 F (36.7 C) 98.6 F (37 C)   TempSrc: Oral Oral Oral   SpO2: 100% 100% 100%   Weight:      Height:        NAD ABD soft Sensation intact distally Intact pulses distally Dorsiflexion/Plantar flexion intact Incision: dressing C/D/I Compartment soft   Lab Results  Component Value Date   WBC 8.6 04/26/2017   HGB 10.1 (L) 04/26/2017   HCT 31.2 (L) 04/26/2017   MCV 99.4 04/26/2017   PLT 182 04/26/2017   BMET    Component Value Date/Time   NA 141 04/26/2017 0523   K 3.7 04/26/2017 0523   CL 109 04/26/2017 0523   CO2 25 04/26/2017 0523   GLUCOSE 96 04/26/2017 0523   BUN 12 04/26/2017 0523   CREATININE 0.85 04/26/2017 0523   CREATININE 0.77 07/10/2010 1507   CALCIUM 8.7 (L) 04/26/2017 0523   GFRNONAA >60 04/26/2017 0523   GFRNONAA >60 07/10/2010 1507   GFRAA >60 04/26/2017 0523   GFRAA >60 07/10/2010 1507     Assessment/Plan: 1 Day Post-Op   Principal Problem:   Primary osteoarthritis of right hip   WBAT with walker DVT ppx: ASA, SCDs, TEDS PO pain control PT/OT Hypotension: secondary to volume loss / ABLA, will bolus 1L NS and monitor Dispo: D/C home with HEP if BP improves    Hilton Cork Kongmeng Santoro 04/26/2017, 8:01 AM   Rod Can, MD Cell [phone]

## 2017-04-26 NOTE — Discharge Summary (Signed)
Physician Discharge Summary  Patient ID: [name] White MRN: [id number] DOB/AGE: 07/03/1965 52 y.o.  Admit date: 04/25/2017 Discharge date: 04/26/2017  Admission Diagnoses:  Primary osteoarthritis of right hip  Discharge Diagnoses:  Principal Problem:   Primary osteoarthritis of right hip   Past Medical History:  Diagnosis Date  . Anterolisthesis   . Anxiety    per pt, has panic attacks  . Arthritis    in back per pt.  . Arthritis of right hip   . Barrett's esophagus    egd 2007 + 2009 but none 2011 or 08/2010 egd  . Chronic headache   . Colon polyp    hyperplastic  . Depression    hx of suicide attempt in 2003  . Grief reaction   . Hidradenitis suppurativa   . IBS (irritable bowel syndrome)   . Migraine   . OSA (obstructive sleep apnea)   . Ovarian cyst   . Restless leg   . Tobacco abuse     Surgeries: Procedure(s): RIGHT TOTAL HIP ARTHROPLASTY ANTERIOR APPROACH on 04/25/2017   Consultants (if any):   Discharged Condition: Improved  Hospital Course: [name] White is an 52 y.o. female who was admitted 04/25/2017 with a diagnosis of Primary osteoarthritis of right hip and went to the operating room on 04/25/2017 and underwent the above named procedures.    She was given perioperative antibiotics:  Anti-infectives (From admission, onward)   Start     Dose/Rate Route Frequency Ordered Stop   04/25/17 1800  clindamycin (CLEOCIN) IVPB 600 mg     600 mg 100 mL/hr over 30 Minutes Intravenous Every 6 hours 04/25/17 1520 04/26/17 0012   04/25/17 1030  clindamycin (CLEOCIN) IVPB 900 mg     900 mg 100 mL/hr over 30 Minutes Intravenous To [facility] 04/22/17 1027 04/25/17 1233    .  She was given sequential compression devices, early ambulation, and ASA for DVT prophylaxis.  She benefited maximally from the hospital stay and there were no complications.    Recent vital signs:  Vitals:   04/26/17 0726 04/26/17 1500  BP: (!) 92/54 (!) 98/50  Pulse:  64  Resp:  16   Temp:  98.6 F (37 C)  SpO2:  100%    Recent laboratory studies:  Lab Results  Component Value Date   HGB 10.1 (L) 04/26/2017   HGB 12.5 04/14/2017   HGB 12.5 03/29/2017   Lab Results  Component Value Date   WBC 8.6 04/26/2017   PLT 182 04/26/2017   Lab Results  Component Value Date   INR 1.07 04/30/2010   Lab Results  Component Value Date   NA 141 04/26/2017   K 3.7 04/26/2017   CL 109 04/26/2017   CO2 25 04/26/2017   BUN 12 04/26/2017   CREATININE 0.85 04/26/2017   GLUCOSE 96 04/26/2017    Discharge Medications:   Allergies as of 04/26/2017      Reactions   Morphine Hives, Nausea And Vomiting   INTOLERANCE >  N & V   Penicillins Hives, Itching, Other (See Comments)    PATIENT HAS HAD A PCN REACTION WITH IMMEDIATE RASH, FACIAL/TONGUE/THROAT SWELLING, SOB, OR LIGHTHEADEDNESS WITH HYPOTENSION:  #  #  #  YES  #  #  #   Has patient had a PCN reaction causing severe rash involving mucus membranes or skin necrosis: Unknown Has patient had a PCN reaction that required hospitalization: No Has patient had a PCN reaction occurring within the last 10 years:  No If all of the above answers are "NO", then may proceed with Cephalosporin use.      Medication List    STOP taking these medications   HYDROcodone-acetaminophen 5-325 MG tablet Commonly known as:  NORCO/VICODIN Replaced by:  HYDROcodone-acetaminophen 7.5-325 MG tablet     TAKE these medications   ALPRAZolam 1 MG tablet Commonly known as:  XANAX TAKE 1/2 TO 1 (ONE-HALF TO ONE) TABLET BY MOUTH AT BEDTIME AS NEEDED   AMITIZA 24 MCG capsule Generic drug:  lubiprostone Take 24 mcg by mouth 2 (two) times daily with a meal.   aspirin 81 MG chewable tablet Chew 1 tablet (81 mg total) by mouth 2 (two) times daily.   docusate sodium 100 MG capsule Commonly known as:  COLACE Take 1 capsule (100 mg total) by mouth 2 (two) times daily.   DULoxetine 30 MG capsule Commonly known as:  CYMBALTA Take 60 mg by mouth at  bedtime.   HYDROcodone-acetaminophen 7.5-325 MG tablet Commonly known as:  NORCO Take 1-2 tablets by mouth every 6 (six) hours as needed (hip pain). Replaces:  HYDROcodone-acetaminophen 5-325 MG tablet   lidocaine 2 % solution Commonly known as:  XYLOCAINE Use as directed 20 mLs in the mouth or throat as needed.   LINZESS 290 MCG Caps capsule Generic drug:  linaclotide TAKE 1 TABLET BY MOUTH DAILY   methocarbamol 500 MG tablet Commonly known as:  ROBAXIN Take 1 tablet (500 mg total) by mouth every 6 (six) hours as needed for muscle spasms.   nitroGLYCERIN 0.4 MG SL tablet Commonly known as:  NITROSTAT nitroglycerin 0.4 mg sublingual tablet  as needed for chest pain   ondansetron 4 MG tablet Commonly known as:  ZOFRAN TAKE 1 TABLET BY MOUTH EVERY 6 HOURS AS NEEDED FOR NAUSEA   OVER THE COUNTER MEDICATION Take 1 tablet by mouth 3 (three) times daily. IBgard   pantoprazole 40 MG tablet Commonly known as:  PROTONIX Take 1 tablet (40 mg total) by mouth daily. What changed:  when to take this   ranitidine 300 MG tablet Commonly known as:  ZANTAC Take 300 mg by mouth at bedtime.   rOPINIRole 1 MG tablet Commonly known as:  REQUIP Take 1-2 tablets (1-2 mg total) at bedtime by mouth. What changed:  how much to take   senna 8.6 MG Tabs tablet Commonly known as:  SENOKOT Take 2 tablets (17.2 mg total) by mouth at bedtime.   SUMAtriptan 100 MG tablet Commonly known as:  IMITREX TAKE 1 AT THE EARLIEST ONSET OF HEADACHE. MAY REPEAT ONCE IN 2 HOURS IF HEADACHE PERSISTS OR RECURS. DO NOT EXCEED 2 TABLETS IN 24 HOURS   topiramate 50 MG tablet Commonly known as:  TOPAMAX Take 150 mg by mouth at bedtime. 3 tablets            Durable Medical Equipment  (From admission, onward)        Start     Ordered   04/26/17 1129  For home use only DME 3 n 1  Once     04/26/17 1130   04/26/17 1129  For home use only DME Walker rolling  Once    Question:  Patient needs a walker  to treat with the following condition  Answer:  S/P total hip arthroplasty   04/26/17 1130      Diagnostic Studies: Dg Chest 2 View  Result Date: 03/30/2017 CLINICAL DATA:  Preoperative clearance.  Surgery not stated. EXAM: CHEST  2 VIEW COMPARISON:  05/20/2016 FINDINGS: The cardiac silhouette, mediastinal and hilar contours are within normal limits and stable. The lungs are clear. No pleural effusion. No worrisome pulmonary lesions. The bony thorax is intact. IMPRESSION: No acute cardiopulmonary findings. Electronically Signed   By: Marijo Sanes M.D.   On: 03/30/2017 08:54   Dg Pelvis Portable  Result Date: 04/25/2017 CLINICAL DATA:  Postop right total hip replacement. EXAM: PORTABLE PELVIS 1-2 VIEWS COMPARISON:  Intraoperative views same date. Hip radiographs 09/08/2016. FINDINGS: 1524 hours. The upper pelvis is excluded. Patient is status post right total hip arthroplasty. The hardware appears well positioned. No evidence of acute fracture or dislocation. There is gas in the soft tissues surrounding the right hip. Mild left hip degenerative changes are present. IMPRESSION: No demonstrated complication following right total hip arthroplasty. Electronically Signed   By: Richardean Sale M.D.   On: 04/25/2017 15:55   Dg C-arm 1-60 Min  Result Date: 04/25/2017 CLINICAL DATA:  52 year old female post right hip replacement from anterior approach. Initial encounter. EXAM: OPERATIVE right HIP (WITH PELVIS IF PERFORMED) 2 VIEWS TECHNIQUE: Fluoroscopic spot image(s) were submitted for interpretation post-operatively. COMPARISON:  09/08/2016 plain film exam. FINDINGS: Post total right hip replacement which appears in satisfactory position without complication noted on frontal projection. IMPRESSION: Post total right hip replacement. Electronically Signed   By: Genia Del M.D.   On: 04/25/2017 14:18   Dg C-arm 1-60 Min  Result Date: 04/25/2017 CLINICAL DATA:  52 year old female post right hip replacement  from anterior approach. Initial encounter. EXAM: OPERATIVE right HIP (WITH PELVIS IF PERFORMED) 2 VIEWS TECHNIQUE: Fluoroscopic spot image(s) were submitted for interpretation post-operatively. COMPARISON:  09/08/2016 plain film exam. FINDINGS: Post total right hip replacement which appears in satisfactory position without complication noted on frontal projection. IMPRESSION: Post total right hip replacement. Electronically Signed   By: Genia Del M.D.   On: 04/25/2017 14:18   Dg Hip Operative Unilat W Or W/o Pelvis Right  Result Date: 04/25/2017 CLINICAL DATA:  52 year old female post right hip replacement from anterior approach. Initial encounter. EXAM: OPERATIVE right HIP (WITH PELVIS IF PERFORMED) 2 VIEWS TECHNIQUE: Fluoroscopic spot image(s) were submitted for interpretation post-operatively. COMPARISON:  09/08/2016 plain film exam. FINDINGS: Post total right hip replacement which appears in satisfactory position without complication noted on frontal projection. IMPRESSION: Post total right hip replacement. Electronically Signed   By: Genia Del M.D.   On: 04/25/2017 14:18    Disposition: 01-Home or Self Care  Discharge Instructions    Call MD / Call 911   Complete by:  As directed    If you experience chest pain or shortness of breath, CALL 911 and be transported to the hospital emergency room.  If you develope a fever above 101 F, pus (white drainage) or increased drainage or redness at the wound, or calf pain, call your surgeon's office.   Constipation Prevention   Complete by:  As directed    Drink plenty of fluids.  Prune juice may be helpful.  You may use a stool softener, such as Colace (over the counter) 100 mg twice a day.  Use MiraLax (over the counter) for constipation as needed.   Diet - low sodium heart healthy   Complete by:  As directed    Driving restrictions   Complete by:  As directed    No driving for 6 weeks   Increase activity slowly as tolerated   Complete by:  As  directed    Lifting restrictions   Complete by:  As directed    No lifting for 6 weeks   TED hose   Complete by:  As directed    Use stockings (TED hose) for 2 weeks on both leg(s).  You may remove them at night for sleeping.      Follow-up Information    Paddy Neis, Aaron Edelman, MD. Schedule an appointment as soon as possible for a visit in 2 weeks.   Specialty:  Orthopedic Surgery Why:  For wound re-check Contact information: [address] [place] [address] [phone]            Signed: Hilton Cork Takahiro Godinho 04/26/2017, 6:02 PM

## 2017-04-26 NOTE — Progress Notes (Signed)
Physical Therapy Treatment Patient Details Name: [name] White MRN: [id number] DOB: Jul 11, 1965 Today's Date: 04/26/2017    History of Present Illness Pt is a 52 y/o female s/p elective R THA, direct anterior approach. PMH inlucdes depression, OSA, and tobacco abuse.     PT Comments    Continuing work on functional mobility and activity tolerance;  Moving slowly, but overall moving well; Some dizziness reported, but it did not worsen with progressive ambulation; Will plan for stair training next session   Follow Up Recommendations  Follow surgeon's recommendation for DC plan and follow-up therapies;Supervision for mobility/OOB     Equipment Recommendations  Rolling walker with 5" wheels;3in1 (PT);Other (comment)(shower chair )    Recommendations for Other Services       Precautions / Restrictions Precautions Precautions: None Restrictions Weight Bearing Restrictions: No RLE Weight Bearing: Weight bearing as tolerated    Mobility  Bed Mobility Overal bed mobility: Needs Assistance Bed Mobility: Supine to Sit;Sit to Supine     Supine to sit: Min guard Sit to supine: Min guard   General bed mobility comments: Cues for technique; used LLE to assist RLE into bed; no physical assist noted  Transfers Overall transfer level: Needs assistance Equipment used: Rolling walker (2 wheeled) Transfers: Sit to/from Stand Sit to Stand: Supervision         General transfer comment: cues for hand placement and safety; good rise  Ambulation/Gait Ambulation/Gait assistance: Min guard Ambulation Distance (Feet): 120 Feet Assistive device: Rolling walker (2 wheeled) Gait Pattern/deviations: Step-through pattern Gait velocity: Decreased    General Gait Details: Cues for posture and to smooth out steps   Stairs            Wheelchair Mobility    Modified Rankin (Stroke Patients Only)       Balance                                             Cognition Arousal/Alertness: Awake/alert Behavior During Therapy: WFL for tasks assessed/performed Overall Cognitive Status: Within Functional Limits for tasks assessed                                        Exercises Total Joint Exercises Quad Sets: AROM;Right;10 reps Towel Squeeze: AROM;Both;10 reps Heel Slides: AROM;Right;10 reps Hip ABduction/ADduction: AAROM;Right;10 reps    General Comments        Pertinent Vitals/Pain Pain Assessment: Faces Faces Pain Scale: Hurts even more Pain Location: R hip Pain Descriptors / Indicators: Aching;Constant;Operative site guarding Pain Intervention(s): Premedicated before session    Home Living                      Prior Function            PT Goals (current goals can now be found in the care plan section) Acute Rehab PT Goals Patient Stated Goal: walk without pain PT Goal Formulation: With patient Time For Goal Achievement: 05/09/17 Potential to Achieve Goals: Good Progress towards PT goals: Progressing toward goals    Frequency    7X/week      PT Plan Current plan remains appropriate    Co-evaluation              AM-PAC PT "6 Clicks" Daily Activity  Outcome  Measure  Difficulty turning over in bed (including adjusting bedclothes, sheets and blankets)?: A Little Difficulty moving from lying on back to sitting on the side of the bed? : A Little Difficulty sitting down on and standing up from a chair with arms (e.g., wheelchair, bedside commode, etc,.)?: A Little Help needed moving to and from a bed to chair (including a wheelchair)?: None Help needed walking in hospital room?: A Little Help needed climbing 3-5 steps with a railing? : A Little 6 Click Score: 19    End of Session Equipment Utilized During Treatment: Gait belt Activity Tolerance: Patient tolerated treatment well Patient left: in chair;with call bell/phone within reach Nurse Communication: Mobility status PT Visit  Diagnosis: Other abnormalities of gait and mobility (R26.89);Pain Pain - Right/Left: Right Pain - part of body: Hip     Time: 0925-0958 PT Time Calculation (min) (ACUTE ONLY): 33 min  Charges:  $Gait Training: 8-22 mins $Therapeutic Exercise: 8-22 mins                    G Codes:       Roney Marion, PT  Acute Rehabilitation Services Pager [phone] Office [place] 04/26/2017, 1:00 PM

## 2017-04-26 NOTE — Progress Notes (Signed)
Discharged to home with written and verbal discharge instructions.

## 2017-04-26 NOTE — Care Management Note (Signed)
[facility] Management Note  Patient Details  Name: [name] White MRN: [id number] Date of Birth: 09-May-1965  Subjective/Objective:   52 yr old female s/p right total hip arthroplasty.                 Action/Plan: [name] spoke with patient concerning discharge plan and DME. Patient will follow [facility] at discharge per [name]. She will have family support.    Expected Discharge Date:  04/26/17               Expected Discharge Plan:  [name]  In-House Referral:  NA  Discharge planning Services  CM Consult  Post Acute Care Choice:  Durable Medical Equipment Choice offered to:  Patient  DME Arranged:  3-N-1, Walker rolling DME Agency:  [name]:  NA HH Agency:     Status of Service:     If discussed at [place] of Stay Meetings, dates discussed:    Additional Comments:  [name], RN 04/26/2017, 11:31 AM

## 2017-04-26 NOTE — Progress Notes (Signed)
Patient bp 87/42 left arm rechecked bp r arm 92/54 Hr 57. IV fluids Ns was increased from 10mls to 154ml patient was asymptomatic. Updated the day shift RN of changes. Arthor Captain LPN

## 2017-04-26 NOTE — Progress Notes (Signed)
Physical Therapy Treatment Patient Details Name: Chanler Schreiter Dethloff MRN: [id number] DOB: 05/04/1965 Today's Date: 04/26/2017    History of Present Illness Pt is a 52 y/o female s/p elective R THA, direct anterior approach. PMH inlucdes depression, OSA, and tobacco abuse.     PT Comments    Continuing work on functional mobility and activity tolerance;  Session focused on incr amb, stair training, and assessing activity tolerance; After progressive amb and stair training, pt's BP was  112/57, HR 75; Questions solicited and answered; Friend who will be helping her was present and her questions answered as well; Reinforced the need for RW initially; OK for dc home from PT standpoint   Follow Up Recommendations  Follow surgeon's recommendation for DC plan and follow-up therapies;Supervision for mobility/OOB     Equipment Recommendations  Rolling walker with 5" wheels;3in1 (PT);Other (comment)(shower chair )    Recommendations for Other Services       Precautions / Restrictions Precautions Precautions: None Restrictions Weight Bearing Restrictions: No RLE Weight Bearing: Weight bearing as tolerated    Mobility  Bed Mobility Overal bed mobility: Needs Assistance Bed Mobility: Sit to Supine     Supine to sit: Min guard Sit to supine: Supervision   General bed mobility comments: LLE to assist RLE into bed  Transfers Overall transfer level: Needs assistance Equipment used: Rolling walker (2 wheeled) Transfers: Sit to/from Stand Sit to Stand: Supervision         General transfer comment: cues for hand placement and safety; good rise  Ambulation/Gait Ambulation/Gait assistance: Supervision Ambulation Distance (Feet): 140 Feet Assistive device: Rolling walker (2 wheeled) Gait Pattern/deviations: Step-through pattern Gait velocity: Decreased    General Gait Details: Cues for posture and to smooth out steps   Stairs Stairs: Yes   Stair Management: No rails;With  walker;Backwards;One rail Right;One rail Left;Step to pattern;Forwards Number of Stairs: 3(x3 different technqiues: backwds with RW; R rail; L rail) General stair comments: step-by-step cues for sequence and technqiue; Practiced backwards with RW, and with R rail, and then with L rail, to go over all ways to get up steps, and give Ms. Deloney options for when she visits friends  Wheelchair Mobility    Modified Rankin (Stroke Patients Only)       Balance     Sitting balance-Leahy Scale: Good       Standing balance-Leahy Scale: Fair                              Cognition Arousal/Alertness: Awake/alert Behavior During Therapy: WFL for tasks assessed/performed Overall Cognitive Status: Within Functional Limits for tasks assessed                                        Exercises Total Joint Exercises Quad Sets: AROM;Right;10 reps Towel Squeeze: AROM;Both;10 reps Heel Slides: AROM;Right;10 reps Hip ABduction/ADduction: AAROM;Right;10 reps    General Comments        Pertinent Vitals/Pain Pain Assessment: Faces Faces Pain Scale: Hurts even more Pain Location: R hip Pain Descriptors / Indicators: Aching;Constant;Operative site guarding Pain Intervention(s): Monitored during session    Home Living                      Prior Function            PT Goals (current goals can now be  found in the care plan section) Acute Rehab PT Goals Patient Stated Goal: walk without pain PT Goal Formulation: With patient Time For Goal Achievement: 05/09/17 Potential to Achieve Goals: Good Progress towards PT goals: Progressing toward goals    Frequency    7X/week      PT Plan Current plan remains appropriate    Co-evaluation              AM-PAC PT "6 Clicks" Daily Activity  Outcome Measure  Difficulty turning over in bed (including adjusting bedclothes, sheets and blankets)?: A Little Difficulty moving from lying on back to sitting  on the side of the bed? : A Little Difficulty sitting down on and standing up from a chair with arms (e.g., wheelchair, bedside commode, etc,.)?: A Little Help needed moving to and from a bed to chair (including a wheelchair)?: None Help needed walking in hospital room?: None Help needed climbing 3-5 steps with a railing? : A Little 6 Click Score: 20    End of Session Equipment Utilized During Treatment: Gait belt Activity Tolerance: Patient tolerated treatment well Patient left: in bed;with call bell/phone within reach;with family/visitor present Nurse Communication: Mobility status PT Visit Diagnosis: Other abnormalities of gait and mobility (R26.89);Pain Pain - Right/Left: Right Pain - part of body: Hip     Time: 1359-1435 PT Time Calculation (min) (ACUTE ONLY): 36 min  Charges:  $Gait Training: 23-37 mins                    G Codes:       Roney Marion, PT  Acute Rehabilitation Services Pager [phone] Office [place] 04/26/2017, 4:20 PM

## 2017-04-26 NOTE — Anesthesia Postprocedure Evaluation (Signed)
Anesthesia Post Note  Patient: [name] White  Procedure(s) Performed: RIGHT TOTAL HIP ARTHROPLASTY ANTERIOR APPROACH (Right Hip)     Patient location during evaluation: PACU Anesthesia Type: Spinal Level of consciousness: awake Pain management: satisfactory to patient Vital Signs Assessment: post-procedure vital signs reviewed and stable Respiratory status: spontaneous breathing Cardiovascular status: blood pressure returned to baseline Postop Assessment: no headache and spinal receding Anesthetic complications: no    Last Vitals:  Vitals:   04/26/17 0726 04/26/17 1500  BP: (!) 92/54 (!) 98/50  Pulse:  64  Resp:  16  Temp:  37 C  SpO2:  100%    Last Pain:  Vitals:   04/26/17 1500  TempSrc: Oral  PainSc:                  Riccardo Dubin

## 2017-05-05 ENCOUNTER — Encounter: Payer: Self-pay | Admitting: Family Medicine

## 2017-05-05 ENCOUNTER — Ambulatory Visit: Payer: Self-pay

## 2017-05-05 ENCOUNTER — Ambulatory Visit (INDEPENDENT_AMBULATORY_CARE_PROVIDER_SITE_OTHER): Payer: BLUE CROSS/BLUE SHIELD

## 2017-05-05 ENCOUNTER — Ambulatory Visit: Payer: BLUE CROSS/BLUE SHIELD | Admitting: Family Medicine

## 2017-05-05 VITALS — BP 90/60 | HR 94 | Temp 98.3°F | Ht 66.0 in | Wt 190.8 lb

## 2017-05-05 DIAGNOSIS — R5383 Other fatigue: Secondary | ICD-10-CM | POA: Diagnosis not present

## 2017-05-05 DIAGNOSIS — M25551 Pain in right hip: Secondary | ICD-10-CM

## 2017-05-05 LAB — CBC WITH DIFFERENTIAL/PLATELET
Basophils Absolute: 0.1 10*3/uL (ref 0.0–0.1)
Basophils Relative: 0.9 % (ref 0.0–3.0)
Eosinophils Absolute: 0.1 10*3/uL (ref 0.0–0.7)
Eosinophils Relative: 2 % (ref 0.0–5.0)
HCT: 30.4 % — ABNORMAL LOW (ref 36.0–46.0)
Hemoglobin: 10.2 g/dL — ABNORMAL LOW (ref 12.0–15.0)
Lymphocytes Relative: 32.4 % (ref 12.0–46.0)
Lymphs Abs: 2.3 10*3/uL (ref 0.7–4.0)
MCHC: 33.6 g/dL (ref 30.0–36.0)
MCV: 96.5 fl (ref 78.0–100.0)
Monocytes Absolute: 0.7 10*3/uL (ref 0.1–1.0)
Monocytes Relative: 9.2 % (ref 3.0–12.0)
Neutro Abs: 4 10*3/uL (ref 1.4–7.7)
Neutrophils Relative %: 55.5 % (ref 43.0–77.0)
Platelets: 269 10*3/uL (ref 150.0–400.0)
RBC: 3.15 Mil/uL — ABNORMAL LOW (ref 3.87–5.11)
RDW: 13.6 % (ref 11.5–15.5)
WBC: 7.1 10*3/uL (ref 4.0–10.5)

## 2017-05-05 LAB — COMPREHENSIVE METABOLIC PANEL
ALT: 29 U/L (ref 0–35)
AST: 20 U/L (ref 0–37)
Albumin: 3.7 g/dL (ref 3.5–5.2)
Alkaline Phosphatase: 72 U/L (ref 39–117)
BUN: 14 mg/dL (ref 6–23)
CO2: 27 mEq/L (ref 19–32)
Calcium: 9.4 mg/dL (ref 8.4–10.5)
Chloride: 106 mEq/L (ref 96–112)
Creatinine, Ser: 1.01 mg/dL (ref 0.40–1.20)
GFR: 61.05 mL/min (ref >60.00)
Glucose, Bld: 84 mg/dL (ref 70–99)
Potassium: 3.6 mEq/L (ref 3.5–5.1)
Sodium: 137 mEq/L (ref 135–145)
Total Bilirubin: 0.3 mg/dL (ref 0.2–1.2)
Total Protein: 6.6 g/dL (ref 6.0–8.3)

## 2017-05-05 NOTE — Telephone Encounter (Signed)
Pt. Reports she had right hip replacement last Monday and her blood pressure has been "running low since then." Systolic 357- 80. Denies any dizziness. Does state she feels tired and weak. Reports she is eating well and staying hydrated.Ambulating with a walker at home. Appointment made for today. Reason for Disposition . [0] Systolic BP 17-793 AND [9] taking blood pressure medications AND [3] NOT dizzy, lightheaded or weak  Answer Assessment - Initial Assessment Questions 1. BLOOD PRESSURE: "What is the blood pressure?" "Did you take at least two measurements 5 minutes apart?"     79/46   107/66 pulse 66 2. ONSET: "When did you take your blood pressure?"   0600 3. HOW: "How did you obtain the blood pressure?" (e.g., visiting nurse, automatic home BP monitor)     Home BP monitor 4. HISTORY: "Do you have a history of low blood pressure?" "What is your blood pressure normally?"     Yes 5. MEDICATIONS: "Are you taking any medications for blood pressure?" If yes: "Have they been changed recently?"     No 6. PULSE RATE: "Do you know what your pulse rate is?"      69 7. OTHER SYMPTOMS: "Have you been sick recently?" "Have you had a recent injury?"    Right hip replacement lat Monday 8. PREGNANCY: "Is there any chance you are pregnant?" "When was your last menstrual period?"     No  Protocols used: LOW BLOOD PRESSURE-A-AH

## 2017-05-05 NOTE — Telephone Encounter (Signed)
See note

## 2017-05-05 NOTE — Progress Notes (Signed)
[name] White is a 52 y.o. female here for an acute visit.  History of Present Illness:   [name] White CMA acting as scribe for Dr. Juleen China.  HPI: Patient comes in today for her blood pressure. Patient has been hypotensive since she has been home.   Right Hip Surgery: Patient had hip surgery on 04/25/17. She has not had her post op visit with surgeon yet. She is scheduled to go in next week. She has had a lot of pain and some heat in the area with some redness. She is having swelling in the knee. Patient denies any fever or chest pain. She is drinking plenty of fluid. We will get labs today.   PMHx, SurgHx, SocialHx, Medications, and Allergies were reviewed in the Visit Navigator and updated as appropriate.  Review of Systems  Constitutional: Negative for chills and fever.  HENT: Negative for ear pain and nosebleeds.   Eyes: Negative for blurred vision and double vision.  Respiratory: Positive for shortness of breath. Negative for cough.   Cardiovascular: Negative for chest pain and palpitations.  Gastrointestinal: Negative for constipation and vomiting.  Genitourinary: Negative for dysuria.  Musculoskeletal: Positive for joint pain.  Skin: Negative for itching and rash.  Neurological: Negative for dizziness and headaches.  Psychiatric/Behavioral: Negative for substance abuse and suicidal ideas.   Current Medications:   .  ALPRAZolam (XANAX) 1 MG tablet, TAKE 1/2 TO 1 (ONE-HALF TO ONE) TABLET BY MOUTH AT BEDTIME AS NEEDED, Disp: 30 tablet, Rfl: 0 .  aspirin 81 MG chewable tablet, Chew 1 tablet (81 mg total) by mouth 2 (two) times daily., Disp: 60 tablet, Rfl: 1 .  docusate sodium (COLACE) 100 MG capsule, Take 1 capsule (100 mg total) by mouth 2 (two) times daily., Disp: 60 capsule, Rfl: 1 .  DULoxetine (CYMBALTA) 30 MG capsule, Take 60 mg by mouth at bedtime., Disp: , Rfl:  .  HYDROcodone-acetaminophen (NORCO) 7.5-325 MG tablet, Take 1-2 tablets by mouth every 6 (six) hours as  needed (hip pain)., Disp: 60 tablet, Rfl: 0 .  lidocaine (XYLOCAINE) 2 % solution, Use as directed 20 mLs in the mouth or throat as needed., Disp: , Rfl:  .  linaclotide (LINZESS) 290 MCG CAPS capsule, TAKE 1 TABLET BY MOUTH DAILY, Disp: , Rfl:  .  lubiprostone (AMITIZA) 24 MCG capsule, Take 24 mcg by mouth 2 (two) times daily with a meal., Disp: , Rfl:  .  methocarbamol (ROBAXIN) 500 MG tablet, Take 1 tablet (500 mg total) by mouth every 6 (six) hours as needed for muscle spasms., Disp: 20 tablet, Rfl: 0 .  nitroGLYCERIN (NITROSTAT) 0.4 MG SL tablet, nitroglycerin 0.4 mg sublingual tablet  as needed for chest pain, Disp: , Rfl:  .  ondansetron (ZOFRAN) 4 MG tablet, TAKE 1 TABLET BY MOUTH EVERY 6 HOURS AS NEEDED FOR NAUSEA, Disp: , Rfl:  .  OVER THE COUNTER MEDICATION, Take 1 tablet by mouth 3 (three) times daily. IBgard, Disp: , Rfl:  .  pantoprazole (PROTONIX) 40 MG tablet, Take 1 tablet (40 mg total) by mouth daily. (Patient taking differently: Take 40 mg by mouth every evening. ), Disp: 90 tablet, Rfl: 3 .  ranitidine (ZANTAC) 300 MG tablet, Take 300 mg by mouth at bedtime., Disp: , Rfl:  .  rOPINIRole (REQUIP) 1 MG tablet, Take 1-2 tablets (1-2 mg total) at bedtime by mouth. (Patient taking differently: Take 2 mg by mouth at bedtime. ), Disp: 60 tablet, Rfl: 3 .  senna (SENOKOT) 8.6 MG  TABS tablet, Take 2 tablets (17.2 mg total) by mouth at bedtime., Disp: 60 each, Rfl: 3 .  SUMAtriptan (IMITREX) 100 MG tablet, TAKE 1 AT THE EARLIEST ONSET OF HEADACHE. MAY REPEAT ONCE IN 2 HOURS IF HEADACHE PERSISTS OR RECURS. DO NOT EXCEED 2 TABLETS IN 24 HOURS, Disp: 10 tablet, Rfl: 2 .  topiramate (TOPAMAX) 50 MG tablet, Take 150 mg by mouth at bedtime. 3 tablets, Disp: , Rfl:    Allergies  Allergen Reactions  . Morphine Hives and Nausea And Vomiting    INTOLERANCE >  N & V  . Penicillins Hives, Itching and Other (See Comments)     PATIENT HAS HAD A PCN REACTION WITH IMMEDIATE RASH, FACIAL/TONGUE/THROAT  SWELLING, SOB, OR LIGHTHEADEDNESS WITH HYPOTENSION:  #  #  #  YES  #  #  #   Has patient had a PCN reaction causing severe rash involving mucus membranes or skin necrosis: Unknown Has patient had a PCN reaction that required hospitalization: No Has patient had a PCN reaction occurring within the last 10 years: No If all of the above answers are "NO", then may proceed with Cephalosporin use.    Review of Systems:   Pertinent items are noted in the HPI. Otherwise, ROS is negative.  Vitals:   Vitals:   05/05/17 1301  BP: 90/60  Pulse: 94  Temp: 98.3 F (36.8 C)  TempSrc: Oral  SpO2: 98%  Weight: 190 lb 12.8 oz (86.5 kg)  Height: 5\' 6"  (1.676 m)     Body mass index is 30.8 kg/m.  Physical Exam:   Physical Exam  Constitutional: She appears well-nourished.  HENT:  Head: Normocephalic and atraumatic.  Eyes: EOM are normal. Pupils are equal, round, and reactive to light.  Neck: Normal range of motion. Neck supple.  Cardiovascular: Normal rate, regular rhythm, normal heart sounds and intact distal pulses.  Pulmonary/Chest: Effort normal.  Abdominal: Soft.  Musculoskeletal:  Edema at postop site but no sign of infection.  Skin: Skin is warm.  Psychiatric: She has a normal mood and affect. Her behavior is normal.  Nursing note and vitals reviewed.   Results for orders placed or performed in visit on 05/05/17  CBC with Differential/Platelet  Result Value Ref Range   WBC 7.1 4.0 - 10.5 K/uL   RBC 3.15 (L) 3.87 - 5.11 Mil/uL   Hemoglobin 10.2 (L) 12.0 - 15.0 g/dL   HCT 30.4 (L) 36.0 - 46.0 %   MCV 96.5 78.0 - 100.0 fl   MCHC 33.6 30.0 - 36.0 g/dL   RDW 13.6 11.5 - 15.5 %   Platelets 269.0 150.0 - 400.0 K/uL   Neutrophils Relative % 55.5 43.0 - 77.0 %   Lymphocytes Relative 32.4 12.0 - 46.0 %   Monocytes Relative 9.2 3.0 - 12.0 %   Eosinophils Relative 2.0 0.0 - 5.0 %   Basophils Relative 0.9 0.0 - 3.0 %   Neutro Abs 4.0 1.4 - 7.7 K/uL   Lymphs Abs 2.3 0.7 - 4.0 K/uL    Monocytes Absolute 0.7 0.1 - 1.0 K/uL   Eosinophils Absolute 0.1 0.0 - 0.7 K/uL   Basophils Absolute 0.1 0.0 - 0.1 K/uL  Comprehensive metabolic panel  Result Value Ref Range   Sodium 137 135 - 145 mEq/L   Potassium 3.6 3.5 - 5.1 mEq/L   Chloride 106 96 - 112 mEq/L   CO2 27 19 - 32 mEq/L   Glucose, Bld 84 70 - 99 mg/dL   BUN 14 6 -  23 mg/dL   Creatinine, Ser 1.01 0.40 - 1.20 mg/dL   Total Bilirubin 0.3 0.2 - 1.2 mg/dL   Alkaline Phosphatase 72 39 - 117 U/L   AST 20 0 - 37 U/L   ALT 29 0 - 35 U/L   Total Protein 6.6 6.0 - 8.3 g/dL   Albumin 3.7 3.5 - 5.2 g/dL   Calcium 9.4 8.4 - 10.5 mg/dL   GFR 61.05 >60.00 mL/min   Assessment and Plan:   1. Pain of right hip joint No sign of post-op complication today.  - CBC with Differential/Platelet - Comprehensive metabolic panel - DG HIP UNILAT W OR W/O PELVIS 2-3 VIEWS RIGHT; Future  2. Other fatigue BP always marginally low. Discussed resting more - she is overdoing it. Red flags reviewed.  . Reviewed expectations re: course of current medical issues. . Discussed self-management of symptoms. . Outlined signs and symptoms indicating need for more acute intervention. . Patient verbalized understanding and all questions were answered. Marland Kitchen Health Maintenance issues including appropriate healthy diet, exercise, and smoking avoidance were discussed with patient. . See orders for this visit as documented in the electronic medical record. . Patient received an After Visit Summary.  CMA served as Education administrator during this visit. History, Physical, and Plan performed by medical provider. The above documentation has been reviewed and is accurate and complete. Briscoe Deutscher, D.O.  Briscoe Deutscher, DO [place], [facility] 05/06/2017

## 2017-05-05 NOTE — Telephone Encounter (Signed)
Spoke with the patient about her blood pressure readings. She stated that they have been running 97/59, 96/61, 94/61, and one was 115/86. Patient denies any other symptoms. She stated that she is more concerned about hip pain than BP. I explained that she would have to call the surgeon for this. Patient verbalized understanding. Patient does not take BP medication. I told her that if we do not call her back to plan on coming for appointment.

## 2017-05-06 ENCOUNTER — Encounter: Payer: Self-pay | Admitting: Family Medicine

## 2017-05-06 ENCOUNTER — Telehealth: Payer: Self-pay | Admitting: Family Medicine

## 2017-05-06 NOTE — Telephone Encounter (Signed)
See note.  Copied from [place]. Topic: Inquiry >> May 06, 2017  4:04 PM Conception Chancy, NT wrote: Patient would like to get her blood work and xray results.

## 2017-05-09 ENCOUNTER — Ambulatory Visit (INDEPENDENT_AMBULATORY_CARE_PROVIDER_SITE_OTHER): Payer: BLUE CROSS/BLUE SHIELD | Admitting: Psychology

## 2017-05-09 DIAGNOSIS — F331 Major depressive disorder, recurrent, moderate: Secondary | ICD-10-CM

## 2017-05-09 NOTE — Telephone Encounter (Signed)
Dr. Juleen China sent message to patient with results. Patient seen results on 05/06/16.

## 2017-05-16 ENCOUNTER — Other Ambulatory Visit: Payer: Self-pay | Admitting: Family Medicine

## 2017-05-17 NOTE — Telephone Encounter (Signed)
Okay refill. 

## 2017-05-17 NOTE — Telephone Encounter (Signed)
Please advise on refill.

## 2017-05-18 ENCOUNTER — Telehealth: Payer: Self-pay | Admitting: Neurology

## 2017-05-18 MED ORDER — TOPIRAMATE 50 MG PO TABS: 150.0000 mg | ORAL_TABLET | Freq: Every day | ORAL | 5 refills | Status: DC

## 2017-05-18 NOTE — Telephone Encounter (Signed)
If patient is at the pharmacy, call can be transferred to refill team.  1.     Which medications need to be refilled? (please list name of each medication and dose if know) Topiramate  2.     Which pharmacy/location (including street and city if local pharmacy) is medication to be sent to? [facility]  3.     Do they need a 30 or 90 day supply? Patient did not say. She said that she is out of Refills and that the pharmacy sent over a refill request. Please Call. Thanks

## 2017-05-23 ENCOUNTER — Ambulatory Visit (INDEPENDENT_AMBULATORY_CARE_PROVIDER_SITE_OTHER): Payer: BLUE CROSS/BLUE SHIELD | Admitting: Psychology

## 2017-05-23 DIAGNOSIS — F331 Major depressive disorder, recurrent, moderate: Secondary | ICD-10-CM

## 2017-06-06 ENCOUNTER — Ambulatory Visit: Payer: BLUE CROSS/BLUE SHIELD | Admitting: Psychology

## 2017-06-17 ENCOUNTER — Encounter: Payer: Self-pay | Admitting: Neurology

## 2017-06-22 ENCOUNTER — Other Ambulatory Visit: Payer: Self-pay | Admitting: Orthopedic Surgery

## 2017-06-22 DIAGNOSIS — R102 Pelvic and perineal pain: Secondary | ICD-10-CM

## 2017-06-24 ENCOUNTER — Ambulatory Visit
Admission: RE | Admit: 2017-06-24 | Discharge: 2017-06-24 | Disposition: A | Discharge status: Home / Self Care | Payer: BLUE CROSS/BLUE SHIELD | Source: Ambulatory Visit | Attending: Orthopedic Surgery | Admitting: Orthopedic Surgery

## 2017-06-24 DIAGNOSIS — R102 Pelvic and perineal pain: Secondary | ICD-10-CM

## 2017-06-28 ENCOUNTER — Other Ambulatory Visit: Payer: Self-pay | Admitting: Family Medicine

## 2017-06-29 NOTE — Telephone Encounter (Signed)
Please advise on refill.

## 2017-07-02 ENCOUNTER — Other Ambulatory Visit: Payer: Self-pay | Admitting: Pulmonary Disease

## 2017-07-04 ENCOUNTER — Other Ambulatory Visit: Payer: Self-pay

## 2017-07-04 MED ORDER — ROPINIROLE HCL 1 MG PO TABS: 2.0000 mg | ORAL_TABLET | Freq: Every day | ORAL | 1 refills | Status: DC

## 2017-07-27 ENCOUNTER — Ambulatory Visit (INDEPENDENT_AMBULATORY_CARE_PROVIDER_SITE_OTHER): Payer: BLUE CROSS/BLUE SHIELD | Admitting: Psychology

## 2017-07-27 DIAGNOSIS — F331 Major depressive disorder, recurrent, moderate: Secondary | ICD-10-CM | POA: Diagnosis not present

## 2017-08-01 ENCOUNTER — Telehealth: Payer: Self-pay | Admitting: Pulmonary Disease

## 2017-08-01 ENCOUNTER — Telehealth: Payer: Self-pay | Admitting: Family Medicine

## 2017-08-01 ENCOUNTER — Encounter: Payer: Self-pay | Admitting: Family Medicine

## 2017-08-01 ENCOUNTER — Other Ambulatory Visit: Payer: Self-pay | Admitting: Family Medicine

## 2017-08-01 MED ORDER — DULOXETINE HCL 30 MG PO CPEP: 60.0000 mg | ORAL_CAPSULE | Freq: Every day | ORAL | 3 refills | Status: DC

## 2017-08-01 MED ORDER — ROPINIROLE HCL 1 MG PO TABS: 2.0000 mg | ORAL_TABLET | Freq: Every day | ORAL | 1 refills | Status: DC

## 2017-08-01 NOTE — Telephone Encounter (Signed)
See note

## 2017-08-01 NOTE — Telephone Encounter (Signed)
Refill request for Cymbalta / Showing as a historical med:   Disp Refills Start End   DULoxetine (CYMBALTA) 30 MG capsule       Sig - Route: Take 60 mg by mouth at bedtime. - Oral   Class: Historical Med    / LOV 05-05-17 with Dr. Juleen China for review /

## 2017-08-01 NOTE — Telephone Encounter (Signed)
Spoke with pt, she states she was supposed to receive #60 tablets for her prescription Requip 1mg  to take 2 tablets every night. I apologized to her stating that the original prescription was sent in incorrectly and that I could send it in to the pharmacy. Rx for Requip 1 mg sent into [facility]. Pt understood and nothing further is needed.   Restless leg syndrome. - continue requip 2 mg nightly  Lower back pain. - she is being set up for spinal fusion with Dr. Ronnald Ramp - there are no pulmonary contraindications for her to have surgery - she needs to continue CPAP in the post operative period, and have close monitoring of her oxygen after surgery

## 2017-08-01 NOTE — Telephone Encounter (Signed)
Visit

## 2017-08-01 NOTE — Telephone Encounter (Signed)
Copied from [place] [id number]. Topic: Quick Communication - Rx Refill/Question >> Aug 01, 2017 11:28 AM Synthia Innocent wrote: Medication: DULoxetine (CYMBALTA) 30 MG capsule and xanax 1 mg  Has the patient contacted their pharmacy? Yes.  New Pharmacy (Agent: If no, request that the patient contact the pharmacy for the refill.) (Agent: If yes, when and what did the pharmacy advise?)  Preferred Pharmacy (with phone number or street name): [facility]: Please be advised that RX refills may take up to 3 business days. We ask that you follow-up with your pharmacy.

## 2017-08-01 NOTE — Telephone Encounter (Signed)
Pt is calling back [phone]

## 2017-08-01 NOTE — Telephone Encounter (Signed)
Please advise do you want me to make app?

## 2017-08-01 NOTE — Telephone Encounter (Signed)
Copied from [place] [id number]. Topic: Quick Communication - See Telephone Encounter >> Aug 01, 2017 11:31 AM [name] White wrote: CRM for notification. See Telephone encounter for: 08/01/17. Patient states that Lattie Haw was referring her to see a psychiatrist, she does not see where talking is helping. Would like to speak with Dr Juleen China to see what she thinks is needed. Patient is still having crying spells.

## 2017-08-01 NOTE — Telephone Encounter (Signed)
Left message for patient to call back  

## 2017-08-02 NOTE — Telephone Encounter (Signed)
Called patient she has app for tomorrow.

## 2017-08-03 ENCOUNTER — Encounter: Payer: Self-pay | Admitting: Family Medicine

## 2017-08-03 ENCOUNTER — Ambulatory Visit: Payer: BLUE CROSS/BLUE SHIELD | Admitting: Family Medicine

## 2017-08-03 VITALS — BP 118/64 | HR 79 | Temp 98.0°F | Ht 66.0 in | Wt 201.4 lb

## 2017-08-03 DIAGNOSIS — R739 Hyperglycemia, unspecified: Secondary | ICD-10-CM | POA: Diagnosis not present

## 2017-08-03 DIAGNOSIS — M5417 Radiculopathy, lumbosacral region: Secondary | ICD-10-CM | POA: Diagnosis not present

## 2017-08-03 DIAGNOSIS — G4700 Insomnia, unspecified: Secondary | ICD-10-CM | POA: Diagnosis not present

## 2017-08-03 DIAGNOSIS — G43109 Migraine with aura, not intractable, without status migrainosus: Secondary | ICD-10-CM

## 2017-08-03 DIAGNOSIS — G4710 Hypersomnia, unspecified: Secondary | ICD-10-CM | POA: Diagnosis not present

## 2017-08-03 DIAGNOSIS — F332 Major depressive disorder, recurrent severe without psychotic features: Secondary | ICD-10-CM | POA: Diagnosis not present

## 2017-08-03 LAB — POCT GLYCOSYLATED HEMOGLOBIN (HGB A1C): Hemoglobin A1C: 5.3 % (ref 4.0–5.6)

## 2017-08-03 MED ORDER — BUPROPION HCL ER (XL) 150 MG PO TB24
ORAL_TABLET | ORAL | 2 refills | Status: DC
Start: 2017-08-03 — End: 2017-08-26

## 2017-08-03 NOTE — Progress Notes (Signed)
[name] White is a 52 y.o. female is here for follow up.  History of Present Illness:   [name] White, CMA acting as scribe for Dr. Briscoe Deutscher.   HPI: Patient in for follow up. She was told by therapist that she needs to see psychiatry. She is on mediations but she does not feel like they are not helpful. She does not want to get out of bed. She is having trouble at work due to it. She is not able to get to work on time and has trouble with getting aggrivated with co workers. She had issues with running out of medications to soon because she started using pill pack and not medication bag so she ran out before she realized. She started back on medications yesterday when she picked up.   She has been seeing therapy but does not feel that it is beneficial for her. She did not have any appointments in may. She tried to call her and let her know that she had very bad "episode" and was not contacted. She called cell phone as well as calling office. She does not feel like her medications are not working she thinks that her increased issues are due to time of year and being near anniversary of mothers passing and fathers day.   She has appointment with behavioral health in August. We did talk about other options as far as providers that may be options. She does not want to have back surgery.   Hip replacement is helping she if feeling better from that. She did get injections in back little over week ago. It is still little tender but she is feeling little bit better as far as pain goes.   There are no preventive care reminders to display for this patient. Depression screen [facility] 2/9 02/08/2017 01/17/2017 11/22/2016  Decreased Interest 3 1 2   Down, Depressed, Hopeless 3 1 2   PHQ - 2 Score 6 2 4   Altered sleeping 3 - 3  Tired, decreased energy 3 - 3  Change in appetite 3 - 3  Feeling bad or failure about yourself  3 - 0  Trouble concentrating 2 - 0  Moving slowly or fidgety/restless 0 - 0    Suicidal thoughts 0 - 0  PHQ-9 Score 20 - 13  Difficult doing work/chores Somewhat difficult - Somewhat difficult  Some recent data might be hidden   PMHx, SurgHx, SocialHx, FamHx, Medications, and Allergies were reviewed in the Visit Navigator and updated as appropriate.   Patient Active Problem List   Diagnosis Date Noted  . Primary osteoarthritis of right hip 04/25/2017  . Gastroparesis 02/15/2017  . Facet arthropathy, lumbar 09/24/2016  . Osteoarthritis (arthritis due to wear and tear of joints) 09/24/2016  . Chronic pansinusitis 08/26/2016  . Weight loss 05/20/2016  . Helicobacter pylori ab+ 11/27/2015  . Personal history of colonic polyps 05/04/2015  . 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 Chronic right hip pain 04/16/2014  . Musculoskeletal chest pain 09/25/2013  . Chronic pelvic pain in female 07/06/2012  . Cystocele 07/06/2012  . Rectocele 07/06/2012  . Excessive sleepiness 01/30/2012  . OSA (obstructive sleep apnea)   . Depression   . Right lumbosacral radiculopathy 07/10/2010  . Dyslipidemia 05/05/2009  . BARRETTS ESOPHAGUS 10/04/2008  . HEADACHE, CHRONIC 09/28/2007   Social History   Tobacco Use  . Smoking status: Former Smoker    Packs/day: 0.50    Years: 30.00  Pack years: 15.00    Types: Cigarettes    Last attempt to quit: 06/21/2015    Years since quitting: 2.1  . Smokeless tobacco: Never Used  Substance Use Topics  . Alcohol use: Yes    Alcohol/week: 0.0 oz    Comment: 2 x a month  . Drug use: No   Current Medications and Allergies:   Current Outpatient Medications:  .  ALPRAZolam (XANAX) 1 MG tablet, TAKE 1/2 TO 1 (ONE-HALF TO ONE) TABLET BY MOUTH AT BEDTIME AS NEEDED, Disp: 30 tablet, Rfl: 0 .  aspirin 81 MG chewable tablet, Chew 1 tablet (81 mg total) by mouth 2 (two) times daily., Disp: 60 tablet, Rfl: 1 .  DULoxetine (CYMBALTA) 30 MG capsule, Take 2 capsules (60 mg total) by mouth  at bedtime., Disp: 90 capsule, Rfl: 3 .  gabapentin (NEURONTIN) 300 MG capsule, Take 900 mg by mouth 3 (three) times daily., Disp: , Rfl:  .  lidocaine (XYLOCAINE) 2 % solution, Use as directed 20 mLs in the mouth or throat as needed., Disp: , Rfl:  .  linaclotide (LINZESS) 290 MCG CAPS capsule, TAKE 1 TABLET BY MOUTH DAILY, Disp: , Rfl:  .  nitroGLYCERIN (NITROSTAT) 0.4 MG SL tablet, nitroglycerin 0.4 mg sublingual tablet  as needed for chest pain, Disp: , Rfl:  .  ondansetron (ZOFRAN) 4 MG tablet, TAKE 1 TABLET BY MOUTH EVERY 6 HOURS AS NEEDED FOR NAUSEA, Disp: , Rfl:  .  OVER THE COUNTER MEDICATION, Take 1 tablet by mouth 3 (three) times daily. IBgard, Disp: , Rfl:  .  pantoprazole (PROTONIX) 40 MG tablet, Take 1 tablet (40 mg total) by mouth daily. (Patient taking differently: Take 40 mg by mouth every evening. ), Disp: 90 tablet, Rfl: 3 .  rOPINIRole (REQUIP) 1 MG tablet, Take 2 tablets (2 mg total) by mouth at bedtime., Disp: 60 tablet, Rfl: 1 .  SUMAtriptan (IMITREX) 100 MG tablet, TAKE 1 AT THE EARLIEST ONSET OF HEADACHE. MAY REPEAT ONCE IN 2 HOURS IF HEADACHE PERSISTS OR RECURS. DO NOT EXCEED 2 TABLETS IN 24 HOURS, Disp: 10 tablet, Rfl: 2 .  topiramate (TOPAMAX) 100 MG tablet, Take 150 mg by mouth at bedtime., Disp: , Rfl:     Allergies  Allergen Reactions  . Morphine Hives and Nausea And Vomiting    INTOLERANCE >  N & V  . Penicillins Hives, Itching and Other (See Comments)     PATIENT HAS HAD A PCN REACTION WITH IMMEDIATE RASH, FACIAL/TONGUE/THROAT SWELLING, SOB, OR LIGHTHEADEDNESS WITH HYPOTENSION:  #  #  #  YES  #  #  #   Has patient had a PCN reaction causing severe rash involving mucus membranes or skin necrosis: Unknown Has patient had a PCN reaction that required hospitalization: No Has patient had a PCN reaction occurring within the last 10 years: No If all of the above answers are "NO", then may proceed with Cephalosporin use.    Review of Systems   Pertinent items are  noted in the HPI. Otherwise, ROS is negative.  Vitals:   Vitals:   08/03/17 1537  BP: 118/64  Pulse: 79  Temp: 98 F (36.7 C)  TempSrc: Oral  SpO2: 99%  Weight: 201 lb 6.4 oz (91.4 kg)  Height: 5\' 6"  (1.676 m)     Body mass index is 32.51 kg/m.  Physical Exam:   Physical Exam  Constitutional: She is oriented to person, place, and time. She appears well-developed and well-nourished. No distress.  HENT:  Head: Normocephalic and atraumatic.  Right Ear: External ear normal.  Left Ear: External ear normal.  Nose: Nose normal.  Mouth/Throat: Oropharynx is clear and moist.  Eyes: Pupils are equal, round, and reactive to light. Conjunctivae and EOM are normal.  Neck: Normal range of motion. Neck supple. No thyromegaly present.  Cardiovascular: Normal rate, regular rhythm, normal heart sounds and intact distal pulses.  Pulmonary/Chest: Effort normal and breath sounds normal.  Abdominal: Soft. Bowel sounds are normal.  Musculoskeletal: Normal range of motion.  Lymphadenopathy:    She has no cervical adenopathy.  Neurological: She is alert and oriented to person, place, and time.  Skin: Skin is warm and dry. Capillary refill takes less than 2 seconds.  Psychiatric: She has a normal mood and affect. Her behavior is normal.  Nursing note and vitals reviewed.   Results for orders placed or performed in visit on 08/03/17  POCT glycosylated hemoglobin (Hb A1C)  Result Value Ref Range   Hemoglobin A1C 5.3 4.0 - 5.6 %   HbA1c, POC (prediabetic range)  5.7 - 6.4 %   HbA1c, POC (controlled diabetic range)  0.0 - 7.0 %    Assessment and Plan:   Sayuri was seen today for follow-up.  Diagnoses and all orders for this visit:  Insomnia, unspecified type Comments: To sleep specialist.  Orders: -     Ambulatory referral to Neurology  Severe episode of recurrent major depressive disorder, without psychotic features ([place]) Comments: I agree with Psych referral. Okay to add  Wellbutrin today while waiting. No SI. Orders: -     buPROPion (WELLBUTRIN XL) 150 MG 24 hr tablet; Start 150 mg ER PO qam  Migraine with aura and without status migrainosus, not intractable  Excessive sleepiness -     Ambulatory referral to Neurology  Hyperglycemia Comments: Patient reported a BG > 400 at the end of our visit. Normal A1c.  Orders: -     POCT glycosylated hemoglobin (Hb A1C)  Right lumbosacral radiculopathy    . Reviewed expectations re: course of current medical issues. . Discussed self-management of symptoms. . Outlined signs and symptoms indicating need for more acute intervention. . Patient verbalized understanding and all questions were answered. Marland Kitchen Health Maintenance issues including appropriate healthy diet, exercise, and smoking avoidance were discussed with patient. . See orders for this visit as documented in the electronic medical record. . Patient received an After Visit Summary.  CMA served as Education administrator during this visit. History, Physical, and Plan performed by medical provider. The above documentation has been reviewed and is accurate and complete. Briscoe Deutscher, D.O.   Briscoe Deutscher, DO [place], [facility] 08/09/2017

## 2017-08-09 ENCOUNTER — Encounter: Payer: Self-pay | Admitting: Family Medicine

## 2017-08-17 ENCOUNTER — Ambulatory Visit (INDEPENDENT_AMBULATORY_CARE_PROVIDER_SITE_OTHER): Payer: BLUE CROSS/BLUE SHIELD | Admitting: Psychology

## 2017-08-17 ENCOUNTER — Encounter: Payer: Self-pay | Admitting: Family Medicine

## 2017-08-17 DIAGNOSIS — F331 Major depressive disorder, recurrent, moderate: Secondary | ICD-10-CM | POA: Diagnosis not present

## 2017-08-18 ENCOUNTER — Other Ambulatory Visit: Payer: Self-pay

## 2017-08-18 MED ORDER — ALPRAZOLAM 1 MG PO TABS: ORAL_TABLET | ORAL | 0 refills | Status: DC

## 2017-08-24 ENCOUNTER — Other Ambulatory Visit: Payer: Self-pay | Admitting: Pulmonary Disease

## 2017-08-24 NOTE — Telephone Encounter (Signed)
Dr. Halford Chessman, please advise if it is okay to refill med for pt.  Pt does not have an upcoming appt scheduled with you.

## 2017-08-26 ENCOUNTER — Other Ambulatory Visit: Payer: Self-pay | Admitting: Family Medicine

## 2017-08-26 DIAGNOSIS — F332 Major depressive disorder, recurrent severe without psychotic features: Secondary | ICD-10-CM

## 2017-08-31 ENCOUNTER — Ambulatory Visit (INDEPENDENT_AMBULATORY_CARE_PROVIDER_SITE_OTHER): Payer: BLUE CROSS/BLUE SHIELD | Admitting: Psychology

## 2017-08-31 DIAGNOSIS — F331 Major depressive disorder, recurrent, moderate: Secondary | ICD-10-CM | POA: Diagnosis not present

## 2017-09-02 ENCOUNTER — Ambulatory Visit: Payer: BLUE CROSS/BLUE SHIELD | Admitting: Family Medicine

## 2017-09-02 ENCOUNTER — Encounter: Payer: Self-pay | Admitting: Family Medicine

## 2017-09-02 VITALS — BP 108/62 | HR 83 | Temp 98.6°F | Ht 66.0 in | Wt 204.6 lb

## 2017-09-02 DIAGNOSIS — M5417 Radiculopathy, lumbosacral region: Secondary | ICD-10-CM | POA: Diagnosis not present

## 2017-09-02 DIAGNOSIS — F332 Major depressive disorder, recurrent severe without psychotic features: Secondary | ICD-10-CM

## 2017-09-02 DIAGNOSIS — G4733 Obstructive sleep apnea (adult) (pediatric): Secondary | ICD-10-CM

## 2017-09-02 MED ORDER — PREGABALIN 50 MG PO CAPS: 50.0000 mg | ORAL_CAPSULE | Freq: Three times a day (TID) | ORAL | 1 refills | Status: DC

## 2017-09-02 NOTE — Progress Notes (Signed)
[name] White is a 52 y.o. female is here for follow up.  History of Present Illness:   [name] CMA acting as scribe for Dr. Juleen China.  HPI: Patient comes in today for her 1 month follow up. Patient states that she does not feel like the Wellbutrin is working. She states that she is still feeling tired. She said that she just does not sleep. Patient has not taken the Gabapentin for the last two weeks. She has not noticed a difference.   Behavioral Health: Patient is still seeing Lattie Haw and has an appointment with Psychology.   Neuropathy: Patient has been taking Gabapentin for her Neuropathy. We will try Lyrica to see if this will work. Prior Auth was started in 01/2017 and was denied. We will try again.  She is having numbness in the right leg and hurts when she is touched. She has been in more pain since she had the hip surgery. Patient stated that her orthopedic surgeon does not believe in physical therapy.   Chronic Fatigue: Patient has not been sleeping.   CPAP: Patient is currently using a cpap. She stated that this dries her mouth out. We have order a sleep study, but she has not been study yet. Patient sees Dr. Halford Chessman at at [facility]. We will contact them to adjust Cpap.   There are no preventive care reminders to display for this patient.   Depression screen [facility] 2/9 09/02/2017 02/08/2017 01/17/2017  Decreased Interest 3 3 1   Down, Depressed, Hopeless 2 3 1   PHQ - 2 Score 5 6 2   Altered sleeping 3 3 -  Tired, decreased energy 3 3 -  Change in appetite 3 3 -  Feeling bad or failure about yourself  1 3 -  Trouble concentrating 2 2 -  Moving slowly or fidgety/restless 2 0 -  Suicidal thoughts 0 0 -  PHQ-9 Score 19 20 -  Difficult doing work/chores Very difficult Somewhat difficult -  Some recent data might be hidden   PMHx, SurgHx, SocialHx, FamHx, Medications, and Allergies were reviewed in the Visit Navigator and updated as appropriate.   Patient Active  Problem List   Diagnosis Date Noted  . Primary osteoarthritis of right hip 04/25/2017  . Gastroparesis 02/15/2017  . Facet arthropathy, lumbar 09/24/2016  . Osteoarthritis (arthritis due to wear and tear of joints) 09/24/2016  . Chronic pansinusitis 08/26/2016  . Weight loss 05/20/2016  . Helicobacter pylori ab+ 11/27/2015  . Personal history of colonic polyps 05/04/2015  . 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 Chronic right hip pain 04/16/2014  . Musculoskeletal chest pain 09/25/2013  . Chronic pelvic pain in female 07/06/2012  . Cystocele 07/06/2012  . Rectocele 07/06/2012  . Excessive sleepiness 01/30/2012  . OSA (obstructive sleep apnea)   . Depression   . Right lumbosacral radiculopathy 07/10/2010  . Dyslipidemia 05/05/2009  . BARRETTS ESOPHAGUS 10/04/2008  . HEADACHE, CHRONIC 09/28/2007   Social History   Tobacco Use  . Smoking status: Former Smoker    Packs/day: 0.50    Years: 30.00    Pack years: 15.00    Types: Cigarettes    Last attempt to quit: 06/21/2015    Years since quitting: 2.2  . Smokeless tobacco: Never Used  Substance Use Topics  . Alcohol use: Yes    Alcohol/week: 0.0 oz    Comment: 2 x a month  . Drug use: No   Current Medications and Allergies:  Current Outpatient Medications:  .  ALPRAZolam (XANAX) 1 MG tablet, 1/2 to 1 tablet by mouth at bedtime as needed, Disp: 30 tablet, Rfl: 0 .  aspirin 81 MG chewable tablet, Chew 1 tablet (81 mg total) by mouth 2 (two) times daily., Disp: 60 tablet, Rfl: 1 .  buPROPion (WELLBUTRIN XL) 150 MG 24 hr tablet, QDLAM, Disp: 90 tablet, Rfl: 1 .  DULoxetine (CYMBALTA) 30 MG capsule, Take 2 capsules (60 mg total) by mouth at bedtime., Disp: 90 capsule, Rfl: 3 .  gabapentin (NEURONTIN) 300 MG capsule, Take 900 mg by mouth 3 (three) times daily., Disp: , Rfl:  .  lidocaine (XYLOCAINE) 2 % solution, Use as directed 20 mLs in the mouth or throat as needed.,  Disp: , Rfl:  .  linaclotide (LINZESS) 290 MCG CAPS capsule, TAKE 1 TABLET BY MOUTH DAILY, Disp: , Rfl:  .  nitroGLYCERIN (NITROSTAT) 0.4 MG SL tablet, nitroglycerin 0.4 mg sublingual tablet  as needed for chest pain, Disp: , Rfl:  .  ondansetron (ZOFRAN) 4 MG tablet, TAKE 1 TABLET BY MOUTH EVERY 6 HOURS AS NEEDED FOR NAUSEA, Disp: , Rfl:  .  OVER THE COUNTER MEDICATION, Take 1 tablet by mouth 3 (three) times daily. IBgard, Disp: , Rfl:  .  pantoprazole (PROTONIX) 40 MG tablet, Take 1 tablet (40 mg total) by mouth daily. (Patient taking differently: Take 40 mg by mouth every evening. ), Disp: 90 tablet, Rfl: 3 .  rOPINIRole (REQUIP) 1 MG tablet, TAKE 2 TABLETS (2 MG TOTAL) BY MOUTH AT BEDTIME., Disp: 60 tablet, Rfl: 1 .  SUMAtriptan (IMITREX) 100 MG tablet, TAKE 1 AT THE EARLIEST ONSET OF HEADACHE. MAY REPEAT ONCE IN 2 HOURS IF HEADACHE PERSISTS OR RECURS. DO NOT EXCEED 2 TABLETS IN 24 HOURS, Disp: 10 tablet, Rfl: 2 .  topiramate (TOPAMAX) 100 MG tablet, Take 150 mg by mouth at bedtime., Disp: , Rfl:  .  pregabalin (LYRICA) 50 MG capsule, Take 1 capsule (50 mg total) by mouth 3 (three) times daily., Disp: 90 capsule, Rfl: 1   Allergies  Allergen Reactions  . Morphine Hives and Nausea And Vomiting    INTOLERANCE >  N & V  . Penicillins Hives, Itching and Other (See Comments)     PATIENT HAS HAD A PCN REACTION WITH IMMEDIATE RASH, FACIAL/TONGUE/THROAT SWELLING, SOB, OR LIGHTHEADEDNESS WITH HYPOTENSION:  #  #  #  YES  #  #  #   Has patient had a PCN reaction causing severe rash involving mucus membranes or skin necrosis: Unknown Has patient had a PCN reaction that required hospitalization: No Has patient had a PCN reaction occurring within the last 10 years: No If all of the above answers are "NO", then may proceed with Cephalosporin use.    Review of Systems   Pertinent items are noted in the HPI. Otherwise, ROS is negative.  Vitals:   Vitals:   09/02/17 1408  BP: 108/62  Pulse: 83    Temp: 98.6 F (37 C)  TempSrc: Oral  SpO2: 98%  Weight: 204 lb 9.6 oz (92.8 kg)  Height: 5\' 6"  (1.676 m)     Body mass index is 33.02 kg/m.  Physical Exam:   Physical Exam  Constitutional: She appears well-nourished.  HENT:  Head: Normocephalic and atraumatic.  Eyes: Pupils are equal, round, and reactive to light. EOM are normal.  Neck: Normal range of motion. Neck supple.  Cardiovascular: Normal rate, regular rhythm, normal heart sounds and intact distal pulses.  Pulmonary/Chest: Effort  normal.  Abdominal: Soft.  Skin: Skin is warm.  Psychiatric: She has a normal mood and affect. Her behavior is normal.  Nursing note and vitals reviewed.  Results for orders placed or performed in visit on 08/03/17  POCT glycosylated hemoglobin (Hb A1C)  Result Value Ref Range   Hemoglobin A1C 5.3 4.0 - 5.6 %   HbA1c, POC (prediabetic range)  5.7 - 6.4 %   HbA1c, POC (controlled diabetic range)  0.0 - 7.0 %    Assessment and Plan:   Lynae was seen today for follow-up.  Diagnoses and all orders for this visit:  Right lumbosacral radiculopathy Comments: Start Lyrica. Orders: -     pregabalin (LYRICA) 50 MG capsule; Take 1 capsule (50 mg total) by mouth 3 (three) times daily.  OSA (obstructive sleep apnea) Comments: Refer back to discuss CPAP.  Severe episode of recurrent major depressive disorder, without psychotic features [facility]) Comments: Seeing therapist. Upcoming appointment with Psych. My hope is that Lyrica may help stablize mood as well as help with pain and sleep.     . Reviewed expectations re: course of current medical issues. . Discussed self-management of symptoms. . Outlined signs and symptoms indicating need for more acute intervention. . Patient verbalized understanding and all questions were answered. Marland Kitchen Health Maintenance issues including appropriate healthy diet, exercise, and smoking avoidance were discussed with patient. . See orders for this visit as  documented in the electronic medical record. . Patient received an After Visit Summary.  Briscoe Deutscher, DO [place] [place] 09/10/2017  Future Appointments  Date Time Provider [place]  09/28/2017  9:00 AM Arfeen, Arlyce Harman, MD [facility] None  09/30/2017  2:00 PM Briscoe Deutscher, DO [facility]  10/02/2017  8:00 PM [facility] ROOM 5 [facility] MSD  11/28/2017 10:50 AM Pieter Partridge, DO [facility] None   CMA served as scribe during this visit. History, Physical, and Plan performed by medical provider. The above documentation has been reviewed and is accurate and complete. Briscoe Deutscher, D.O.

## 2017-09-05 ENCOUNTER — Ambulatory Visit: Payer: BLUE CROSS/BLUE SHIELD | Admitting: Pulmonary Disease

## 2017-09-05 ENCOUNTER — Encounter: Payer: Self-pay | Admitting: Pulmonary Disease

## 2017-09-05 VITALS — BP 114/76 | HR 77 | Ht 66.0 in | Wt 206.0 lb

## 2017-09-05 DIAGNOSIS — R682 Dry mouth, unspecified: Secondary | ICD-10-CM

## 2017-09-05 DIAGNOSIS — G4733 Obstructive sleep apnea (adult) (pediatric): Secondary | ICD-10-CM | POA: Diagnosis not present

## 2017-09-05 DIAGNOSIS — G478 Other sleep disorders: Secondary | ICD-10-CM | POA: Diagnosis not present

## 2017-09-05 NOTE — Patient Instructions (Signed)
Will arrange for in lab CPAP titration study and schedule follow up after this is reviewd

## 2017-09-05 NOTE — Progress Notes (Signed)
[facility]  Chief Complaint  Patient presents with  . Follow-up    Pt has not used cpap machine in last 2 weeks. Pt states it dries her mouth out really bad.     Constitutional: BP 114/76 (BP Location: Left Arm, Cuff Size: Normal)   Pulse 77   Ht 5\' 6"  (1.676 m)   Wt 206 lb (93.4 kg)   SpO2 100%   BMI 33.25 kg/m   History of Present Illness: [name] White is a 52 y.o. female with obstructive sleep apnea.  She has trouble sleeping through the night.  She sleeps for about 4 hours.  Then wakes up around 230 am.  Stays awake for 2 to 3 hrs.  Then back to sleep for several hours.  Makes her late for work.  Feels tired all the time.  She has full face mask.  Gets dry mouth.  Isn't sure about mask leak.  Feels pressure is okay.  AHI was 2.7 on CPAP download with average pressure 12 and 95 th percentile CPAP 14 cm H2O.  Did have mask leak.  Comprehensive Respiratory Exam:  Appearance - well kempt  ENMT - nasal mucosa moist, turbinates clear, midline nasal septum, no dental lesions, no gingival bleeding, no oral exudates, no tonsillar hypertrophy Neck - no masses, trachea midline, no thyromegaly, no elevation in JVP Respiratory - normal appearance of chest wall, normal respiratory effort w/o accessory muscle use, no dullness on percussion, no tactile fremitus, no wheezing or rales CV - s1s2 regular rate and rhythm, no murmurs, no peripheral edema, no varicosities, radial pulses symmetric GI - soft, non tender, no masses, no hepatosplenomegaly Lymph - no adenopathy noted in neck and axillary areas MSK - normal muscle strength and tone, normal gait Ext - no cyanosis, clubbing, or joint inflammation noted Skin - no rashes, lesions, or ulcers Neuro - oriented to person, place, and time Psych - normal mood and affect   Assessment/Plan:  Obstructive sleep apnea. - she is have mouth dryness and mask leak - not sure if pressure setting is  adequate - will arrange for in lab CPAP titration study  Poor sleep pattern. - discussed importance of maintaining regular sleep/wake schedule - proper sleep hygiene reviewed   Patient Instructions  Will arrange for in lab CPAP titration study and schedule follow up after this is reviewd    Chesley Mires, MD [place] 09/05/2017, 10:29 AM  Flow Sheet  Sleep tests: PSG 10/07/11 >> AHI 17.8, SpO2 low 86%, PLMI 10.2 CPAP 10/26/11 to 11/10/11 >> Used on 12 of 15 nights with average 5 hrs 40 min. Average AHI 3.8 with median CPAP 8 cm H2O and 95 th percentile CPAP 11 cm H2O ONO with CPAP and RA 12/15/11 >> Test time 2 hrs 12 min. Mean SpO2 95%, low SpO2 83%. Spent 16 sec with SpO2 < 88% MSLT with CPAP 01/05/12 >> Mean sleep latency 12 min. Sleep in 5/5 naps, no SOREM's HST 01/14/16 >> AHI 9.2, SaO2 low 82%  Past Medical History: She  has a past medical history of Anterolisthesis, Anxiety, Arthritis, Arthritis of right hip, Barrett's esophagus, Chronic headache, Colon polyp, Depression, Grief reaction, Hidradenitis suppurativa, IBS (irritable bowel syndrome), Migraine, OSA (obstructive sleep apnea), Ovarian cyst, Restless leg, and Tobacco abuse.  Past Surgical History: She  has a past surgical history that includes Wrist surgery; Cesarean section; Cholecystectomy; Tubal ligation; plantar fascitis; Abdominal hysterectomy; laparoscopy (04-22-10); Elbow surgery; Salpingectomy (04-22-10); laparoscopy (04-30-10 );  Colonoscopy w/ polypectomy (2012); and Total hip arthroplasty (Right, 04/25/2017).  Family History: Her family history includes Aneurysm in her maternal grandmother; Breast cancer in her maternal aunt and mother; COPD in her mother; Colon cancer in her maternal grandfather; Colon polyps in her brother, maternal grandfather, and mother; Coronary artery disease in her mother; Hypertension in her father; Kidney cancer in her paternal aunt; Kidney disease in her brother;  Migraines in her mother; Other in her brother and mother; Rheum arthritis in her mother; Stroke in her mother; Uterine cancer in her cousin.  Social History: She  reports that she quit smoking about 2 years ago. Her smoking use included cigarettes. She has a 15.00 pack-year smoking history. She has never used smokeless tobacco. She reports that she drinks alcohol. She reports that she does not use drugs.  Medications: Allergies as of 09/05/2017      Reactions   Morphine Hives, Nausea And Vomiting   INTOLERANCE >  N & V   Penicillins Hives, Itching, Other (See Comments)    PATIENT HAS HAD A PCN REACTION WITH IMMEDIATE RASH, FACIAL/TONGUE/THROAT SWELLING, SOB, OR LIGHTHEADEDNESS WITH HYPOTENSION:  #  #  #  YES  #  #  #   Has patient had a PCN reaction causing severe rash involving mucus membranes or skin necrosis: Unknown Has patient had a PCN reaction that required hospitalization: No Has patient had a PCN reaction occurring within the last 10 years: No If all of the above answers are "NO", then may proceed with Cephalosporin use.      Medication List        Accurate as of 09/05/17 10:29 AM. Always use your most recent med list.          ALPRAZolam 1 MG tablet Commonly known as:  XANAX 1/2 to 1 tablet by mouth at bedtime as needed   aspirin 81 MG chewable tablet Chew 1 tablet (81 mg total) by mouth 2 (two) times daily.   buPROPion 150 MG 24 hr tablet Commonly known as:  WELLBUTRIN XL QDLAM   DULoxetine 30 MG capsule Commonly known as:  CYMBALTA Take 2 capsules (60 mg total) by mouth at bedtime.   lidocaine 2 % solution Commonly known as:  XYLOCAINE Use as directed 20 mLs in the mouth or throat as needed.   LINZESS 290 MCG Caps capsule Generic drug:  linaclotide TAKE 1 TABLET BY MOUTH DAILY   nitroGLYCERIN 0.4 MG SL tablet Commonly known as:  NITROSTAT nitroglycerin 0.4 mg sublingual tablet  as needed for chest pain   ondansetron 4 MG tablet Commonly known as:   ZOFRAN TAKE 1 TABLET BY MOUTH EVERY 6 HOURS AS NEEDED FOR NAUSEA   OVER THE COUNTER MEDICATION Take 1 tablet by mouth 3 (three) times daily. IBgard   pantoprazole 40 MG tablet Commonly known as:  PROTONIX Take 1 tablet (40 mg total) by mouth daily.   pregabalin 50 MG capsule Commonly known as:  LYRICA Take 1 capsule (50 mg total) by mouth 3 (three) times daily.   rOPINIRole 1 MG tablet Commonly known as:  REQUIP TAKE 2 TABLETS (2 MG TOTAL) BY MOUTH AT BEDTIME.   SUMAtriptan 100 MG tablet Commonly known as:  IMITREX TAKE 1 AT THE EARLIEST ONSET OF HEADACHE. MAY REPEAT ONCE IN 2 HOURS IF HEADACHE PERSISTS OR RECURS. DO NOT EXCEED 2 TABLETS IN 24 HOURS   topiramate 100 MG tablet Commonly known as:  TOPAMAX Take 150 mg by mouth at bedtime.

## 2017-09-06 ENCOUNTER — Telehealth: Payer: Self-pay

## 2017-09-06 NOTE — Telephone Encounter (Signed)
Karynn Kirker Key: [id number] - Rx #: [id number] Need help? Call us at [phone]  Outcome  Approvedtoday  Effective from 09/06/2017 through 09/04/2020.  DrugLyrica 50MG  capsules  FormBlue Cross [place] Commercial Electronic Request Form (CB)  Original Claim [id number] DRUG REQUIRES PRIOR AUTHORIZATION

## 2017-09-10 ENCOUNTER — Encounter: Payer: Self-pay | Admitting: Family Medicine

## 2017-09-15 ENCOUNTER — Other Ambulatory Visit: Payer: Self-pay | Admitting: Family Medicine

## 2017-09-15 NOTE — Telephone Encounter (Signed)
O/v 09/02/2017 F/u 09/30/2017 Last script 08/18/17 #30 no rf

## 2017-09-19 ENCOUNTER — Other Ambulatory Visit: Payer: Self-pay | Admitting: Pulmonary Disease

## 2017-09-23 ENCOUNTER — Telehealth: Payer: Self-pay | Admitting: *Deleted

## 2017-09-23 MED ORDER — PANTOPRAZOLE SODIUM 40 MG PO TBEC: 40.0000 mg | DELAYED_RELEASE_TABLET | Freq: Every day | ORAL | 3 refills | Status: DC

## 2017-09-23 NOTE — Telephone Encounter (Signed)
Protonix refilled per fax request

## 2017-09-28 ENCOUNTER — Ambulatory Visit (HOSPITAL_COMMUNITY): Payer: Self-pay | Admitting: Psychiatry

## 2017-09-30 ENCOUNTER — Ambulatory Visit: Payer: BLUE CROSS/BLUE SHIELD | Admitting: Family Medicine

## 2017-10-02 ENCOUNTER — Ambulatory Visit (HOSPITAL_BASED_OUTPATIENT_CLINIC_OR_DEPARTMENT_OTHER): Payer: BLUE CROSS/BLUE SHIELD | Attending: Pulmonary Disease | Admitting: Pulmonary Disease

## 2017-10-02 VITALS — Ht 66.0 in | Wt 205.0 lb

## 2017-10-02 DIAGNOSIS — G4733 Obstructive sleep apnea (adult) (pediatric): Secondary | ICD-10-CM | POA: Insufficient documentation

## 2017-10-03 ENCOUNTER — Ambulatory Visit: Payer: BLUE CROSS/BLUE SHIELD | Admitting: Family Medicine

## 2017-10-05 NOTE — Progress Notes (Signed)
[name] White is a 52 y.o. female is here for follow up.  History of Present Illness:   [name] CMA acting as scribe for Dr. Juleen China.  HPI: Patient comes in today for her one month follow up.   Pain: Patient seen the pain specialist and they have started her on dilaudid. She has also started that Lyrica. Patient states that she believes that the Lyrica is helping. Dr. Juleen China said that we would increase the Lyrica today to 100 mg three times a day. Patient does not feel that the Lyrica is sedating to her. Dr. Juleen China suggested to take the medication at [facility] instead of 10 PM. Patient stated that she wishes they would just take the metal back out of her. She feels that it was more nerve pain and not the hip pain. She does not want to go forward with the back surgery.   CPAP: Patient seen the sleep specialist and is using the machine. She has been using it for about 3 days now. She wakes up with a sore throat.   There are no preventive care reminders to display for this patient. Depression screen [facility] 2/9 09/02/2017 02/08/2017 01/17/2017  Decreased Interest 3 3 1   Down, Depressed, Hopeless 2 3 1   PHQ - 2 Score 5 6 2   Altered sleeping 3 3 -  Tired, decreased energy 3 3 -  Change in appetite 3 3 -  Feeling bad or failure about yourself  1 3 -  Trouble concentrating 2 2 -  Moving slowly or fidgety/restless 2 0 -  Suicidal thoughts 0 0 -  PHQ-9 Score 19 20 -  Difficult doing work/chores Very difficult Somewhat difficult -  Some recent data might be hidden   PMHx, SurgHx, SocialHx, FamHx, Medications, and Allergies were reviewed in the Visit Navigator and updated as appropriate.   Patient Active Problem List   Diagnosis Date Noted  . Primary osteoarthritis of right hip 04/25/2017  . Gastroparesis 02/15/2017  . Facet arthropathy, lumbar 09/24/2016  . Osteoarthritis (arthritis due to wear and tear of joints) 09/24/2016  . Chronic pansinusitis 08/26/2016  . Weight loss 05/20/2016    . Helicobacter pylori ab+ 11/27/2015  . Personal history of colonic polyps 05/04/2015  . 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 Chronic right hip pain 04/16/2014  . Musculoskeletal chest pain 09/25/2013  . Chronic pelvic pain in female 07/06/2012  . Cystocele 07/06/2012  . Rectocele 07/06/2012  . Excessive sleepiness 01/30/2012  . OSA (obstructive sleep apnea)   . Depression   . Right lumbosacral radiculopathy 07/10/2010  . Dyslipidemia 05/05/2009  . BARRETTS ESOPHAGUS 10/04/2008  . HEADACHE, CHRONIC 09/28/2007   Social History   Tobacco Use  . Smoking status: Former Smoker    Packs/day: 0.50    Years: 30.00    Pack years: 15.00    Types: Cigarettes    Last attempt to quit: 06/21/2015    Years since quitting: 2.2  . Smokeless tobacco: Never Used  Substance Use Topics  . Alcohol use: Yes    Alcohol/week: 0.0 standard drinks    Comment: 2 x a month  . Drug use: No   Current Medications and Allergies:   .  ALPRAZolam (XANAX) 1 MG tablet, 1/2 to 1 tablet by mouth at bedtime as needed, Disp: 30 tablet, Rfl: 0 .  ALPRAZolam (XANAX) 1 MG tablet, TAKE 1/2 TO 1 (ONE-HALF TO ONE) TABLET BY MOUTH AT BEDTIME AS NEEDED, Disp: 30  tablet, Rfl: 0 .  aspirin 81 MG chewable tablet, Chew 1 tablet (81 mg total) by mouth 2 (two) times daily., Disp: 60 tablet, Rfl: 1 .  buPROPion (WELLBUTRIN XL) 150 MG 24 hr tablet, QDLAM, Disp: 90 tablet, Rfl: 1 .  DULoxetine (CYMBALTA) 30 MG capsule, Take 2 capsules (60 mg total) by mouth at bedtime., Disp: 90 capsule, Rfl: 3 .  lidocaine (XYLOCAINE) 2 % solution, Use as directed 20 mLs in the mouth or throat as needed., Disp: , Rfl:  .  linaclotide (LINZESS) 290 MCG CAPS capsule, TAKE 1 TABLET BY MOUTH DAILY, Disp: , Rfl:  .  nitroGLYCERIN (NITROSTAT) 0.4 MG SL tablet, nitroglycerin 0.4 mg sublingual tablet  as needed for chest pain, Disp: , Rfl:  .  ondansetron (ZOFRAN) 4 MG tablet, TAKE 1 TABLET BY  MOUTH EVERY 6 HOURS AS NEEDED FOR NAUSEA, Disp: , Rfl:  .  OVER THE COUNTER MEDICATION, Take 1 tablet by mouth 3 (three) times daily. IBgard, Disp: , Rfl:  .  pantoprazole (PROTONIX) 40 MG tablet, Take 1 tablet (40 mg total) by mouth daily., Disp: 90 tablet, Rfl: 3 .  pregabalin (LYRICA) 50 MG capsule, Take 1 capsule (50 mg total) by mouth 3 (three) times daily., Disp: 90 capsule, Rfl: 1 .  rOPINIRole (REQUIP) 1 MG tablet, TAKE 2 TABLETS (2 MG TOTAL) BY MOUTH AT BEDTIME., Disp: 60 tablet, Rfl: 1 .  SUMAtriptan (IMITREX) 100 MG tablet, TAKE 1 AT THE EARLIEST ONSET OF HEADACHE. MAY REPEAT ONCE IN 2 HOURS IF HEADACHE PERSISTS OR RECURS. DO NOT EXCEED 2 TABLETS IN 24 HOURS, Disp: 10 tablet, Rfl: 2 .  topiramate (TOPAMAX) 100 MG tablet, Take 150 mg by mouth at bedtime., Disp: , Rfl:    Allergies  Allergen Reactions  . Morphine Hives and Nausea And Vomiting    INTOLERANCE >  N & V  . Penicillins Hives, Itching and Other (See Comments)     PATIENT HAS HAD A PCN REACTION WITH IMMEDIATE RASH, FACIAL/TONGUE/THROAT SWELLING, SOB, OR LIGHTHEADEDNESS WITH HYPOTENSION:  #  #  #  YES  #  #  #   Has patient had a PCN reaction causing severe rash involving mucus membranes or skin necrosis: Unknown Has patient had a PCN reaction that required hospitalization: No Has patient had a PCN reaction occurring within the last 10 years: No If all of the above answers are "NO", then may proceed with Cephalosporin use.    Review of Systems   Pertinent items are noted in the HPI. Otherwise, ROS is negative.  Vitals:   Vitals:   10/07/17 0952  BP: 104/66  Pulse: 78  Temp: 98.6 F (37 C)  TempSrc: Oral  SpO2: 97%  Weight: 206 lb (93.4 kg)  Height: 5\' 6"  (1.676 m)     Body mass index is 33.25 kg/m.  Physical Exam:   Physical Exam  Constitutional: She appears well-nourished.  HENT:  Head: Normocephalic and atraumatic.  Eyes: Pupils are equal, round, and reactive to light. EOM are normal.  Neck: Normal  range of motion. Neck supple.  Cardiovascular: Normal rate, regular rhythm, normal heart sounds and intact distal pulses.  Pulmonary/Chest: Effort normal.  Abdominal: Soft.  Skin: Skin is warm.  Psychiatric: She has a normal mood and affect. Her behavior is normal.  Nursing note and vitals reviewed.  Assessment and Plan:   [name] White was seen today for follow-up.  Diagnoses and all orders for this visit:  Ilioinguinal neuralgia of right side Comments: Okay to  increase medication today. Orders: -     pregabalin (LYRICA) 100 MG capsule; Take 1 capsule (100 mg total) by mouth 3 (three) times daily.  OSA (obstructive sleep apnea) Comments: Working with sleep specialist.   . Reviewed expectations re: course of current medical issues. . Discussed self-management of symptoms. . Outlined signs and symptoms indicating need for more acute intervention. . Patient verbalized understanding and all questions were answered. Marland Kitchen Health Maintenance issues including appropriate healthy diet, exercise, and smoking avoidance were discussed with patient. . See orders for this visit as documented in the electronic medical record. . Patient received an After Visit Summary.  CMA served as Education administrator during this visit. History, Physical, and Plan performed by medical provider. The above documentation has been reviewed and is accurate and complete. Briscoe Deutscher, D.O.   Briscoe Deutscher, DO [place] 10/07/2017

## 2017-10-07 ENCOUNTER — Encounter: Payer: Self-pay | Admitting: Family Medicine

## 2017-10-07 ENCOUNTER — Ambulatory Visit: Payer: BLUE CROSS/BLUE SHIELD | Admitting: Family Medicine

## 2017-10-07 ENCOUNTER — Telehealth: Payer: Self-pay | Admitting: Pulmonary Disease

## 2017-10-07 VITALS — BP 104/66 | HR 78 | Temp 98.6°F | Ht 66.0 in | Wt 206.0 lb

## 2017-10-07 DIAGNOSIS — G4733 Obstructive sleep apnea (adult) (pediatric): Secondary | ICD-10-CM

## 2017-10-07 DIAGNOSIS — Z9989 Dependence on other enabling machines and devices: Secondary | ICD-10-CM

## 2017-10-07 DIAGNOSIS — G5791 Unspecified mononeuropathy of right lower limb: Secondary | ICD-10-CM

## 2017-10-07 MED ORDER — PREGABALIN 100 MG PO CAPS: 100.0000 mg | ORAL_CAPSULE | Freq: Three times a day (TID) | ORAL | 2 refills | Status: DC

## 2017-10-07 NOTE — Telephone Encounter (Signed)
Attempted to call patient today regarding results. I did not receive an answer at time of call. I have left a voicemail message for pt to return call. X1  

## 2017-10-07 NOTE — Procedures (Signed)
    Patient Name: [name] White, [name] Date: 10/02/2017   Gender: Female  D.O.B: 08-09-1965  Age (years): 22  Referring Provider: Chesley Mires MD, ABSM  Height (inches): 25  Interpreting Physician: Chesley Mires MD, ABSM  Weight (lbs): 205  RPSGT: Baxter Flattery  BMI: 33  MRN: [id number]  Neck Size: 15.00  CLINICAL INFORMATION  She has history of obstructive sleep apnea (home sleep study 01/14/16 with AHI 9.2, SaO2 low 82%). She trouble adjusting to CPAP therapy. She is scheduled for a CPAP titration study. SLEEP STUDY TECHNIQUE  As per the AASM Manual for the Scoring of Sleep and Associated Events v2.3 (April 2016) with a hypopnea requiring 4% desaturations. The channels recorded and monitored were frontal, central and occipital EEG, electrooculogram (EOG), submentalis EMG (chin), nasal and oral airflow, thoracic and abdominal wall motion, anterior tibialis EMG, snore microphone, electrocardiogram, and pulse oximetry. Continuous positive airway pressure (CPAP) was initiated at the beginning of the study and titrated to treat sleep-disordered breathing. MEDICATIONS  Medications self-administered by patient taken the night of the study : DULOXETINE, ROPINIROLE HCL, TOPIRAMATE, PANTOPRAZOLE SODIUM TECHNICIAN COMMENTS  Comments added by technician: PATIENT TOLERATED THE MASK Comments added by scorer: N/A  RESPIRATORY PARAMETERS  Optimal PAP Pressure (cm): 7 AHI at Optimal Pressure (/hr): 0.4  Overall Minimal O2 (%): 87.0 Supine % at Optimal Pressure (%): 47  Minimal O2 at Optimal Pressure (%): 87.0      SLEEP ARCHITECTURE  The study was initiated at 11:02:58 PM and ended at 5:00:21 AM. Sleep onset time was 9.1 minutes and the sleep efficiency was 94.2%%. The total sleep time was 336.8 minutes. The patient spent 2.5%% of the night in stage N1 sleep, 77.0%% in stage N2 sleep, 5.2%% in stage N3 and 15.2% in REM.Stage REM latency was 64.0 minutes Wake after sleep onset was 11.5. Alpha  intrusion was absent. Supine sleep was 57.15%. CARDIAC DATA  The 2 lead EKG demonstrated sinus rhythm. The mean heart rate was 67.1 beats per minute. Other EKG findings include: None.  LEG MOVEMENT DATA  The total Periodic Limb Movements of Sleep (PLMS) were 0. The PLMS index was 0.0. A PLMS index of <15 is considered normal in adults. IMPRESSIONS  - She did well with CPAP 7 cm H2O. She was observed in REM and supine sleep at this pressure setting. she did not require the use of supplemental oxygen during this study. DIAGNOSIS  - Obstructive Sleep Apnea (327.23 [G47.33 ICD-10]) RECOMMENDATIONS  - Trial of CPAP therapy on 7 cm H2O with a Large size Fisher&Paykel Nasal Mask Eson mask and heated humidification. [Electronically signed] 10/07/2017 10:20 AM Chesley Mires MD, ABSM  Diplomate, American Board of Sleep Medicine

## 2017-10-07 NOTE — Telephone Encounter (Signed)
CPAP titration 10/02/17 >> CPAP 7 cm H2O >> AHI 0.4, +R, +S.   Please let her know she did well with CPAP 7 cm H2O during sleep study.  Please have her CPAP changed to 7 cm H2O.  Please schedule her for ROV 2 months after changing CPAP setting.

## 2017-10-11 NOTE — Telephone Encounter (Signed)
Called and spoke with patient regarding results.  Informed the patient of results and recommendations today. Placed order with [facility] today to have pressure changed to 7 Pt verbalized understanding and denied any questions or concerns at this time.  Nothing further needed.

## 2017-10-14 ENCOUNTER — Other Ambulatory Visit: Payer: Self-pay | Admitting: Pulmonary Disease

## 2017-10-17 ENCOUNTER — Encounter: Payer: Self-pay | Admitting: Family Medicine

## 2017-10-18 NOTE — Telephone Encounter (Signed)
Dr. Halford Chessman please advise, thank you

## 2017-10-18 NOTE — Telephone Encounter (Signed)
Dr. Halford Chessman please advise on patients e-mail regarding test results. Thank you

## 2017-10-19 ENCOUNTER — Other Ambulatory Visit: Payer: Self-pay | Admitting: Neurology

## 2017-10-20 ENCOUNTER — Telehealth: Payer: Self-pay

## 2017-10-20 ENCOUNTER — Encounter: Payer: Self-pay | Admitting: Family Medicine

## 2017-10-20 ENCOUNTER — Other Ambulatory Visit: Payer: Self-pay

## 2017-10-20 MED ORDER — ALPRAZOLAM 1 MG PO TABS: ORAL_TABLET | ORAL | 0 refills | Status: DC

## 2017-10-20 NOTE — Telephone Encounter (Signed)
Ok to give

## 2017-10-20 NOTE — Telephone Encounter (Signed)
Sleep study faxed to verified fax # listed.  Pt aware.  Nothing further needed.

## 2017-10-20 NOTE — Telephone Encounter (Signed)
Called and spoke with patient regarding below message from VS Waiting on pt to call back with fax number to fax over documents today

## 2017-10-20 NOTE — Telephone Encounter (Signed)
FYI

## 2017-10-20 NOTE — Telephone Encounter (Signed)
-----   Message from Chesley Mires, MD sent at 10/20/2017  8:51 AM EDT ----- Can you print a copy of the CPAP titration study from 10/02/17 and call Ms. Loppnow to determine how she wants to get it: either by mail, fax, or pick up.  Thanks.  V

## 2017-10-20 NOTE — Telephone Encounter (Signed)
Pt is returning call. Per pt the Fax number [phone]. Cb is [phone]

## 2017-10-29 ENCOUNTER — Other Ambulatory Visit: Payer: Self-pay | Admitting: Neurology

## 2017-11-03 ENCOUNTER — Ambulatory Visit (INDEPENDENT_AMBULATORY_CARE_PROVIDER_SITE_OTHER): Payer: BLUE CROSS/BLUE SHIELD | Admitting: Psychology

## 2017-11-03 DIAGNOSIS — F331 Major depressive disorder, recurrent, moderate: Secondary | ICD-10-CM

## 2017-11-10 ENCOUNTER — Other Ambulatory Visit: Payer: Self-pay | Admitting: Pulmonary Disease

## 2017-11-14 ENCOUNTER — Telehealth: Payer: Self-pay | Admitting: Family Medicine

## 2017-11-14 NOTE — Telephone Encounter (Signed)
Rec'd from [facility].D forwarded 3 pages to Dr. Briscoe Deutscher

## 2017-11-19 ENCOUNTER — Other Ambulatory Visit: Payer: Self-pay | Admitting: Gastroenterology

## 2017-11-21 NOTE — Progress Notes (Deleted)
Psychiatric Initial Adult Assessment   Patient Identification: [name] White MRN:  [id number] Date of Evaluation:  11/21/2017 Referral Source: *** Chief Complaint:   Visit Diagnosis: No diagnosis found.  History of Present Illness:   [name] White is a 52 y.o. year old female with a history of depression, anxiety, chronic pain, OSA on CPAP, who is referred for depression.     Associated Signs/Symptoms: Depression Symptoms:  {DEPRESSION SYMPTOMS:20000} (Hypo) Manic Symptoms:  {BHH MANIC SYMPTOMS:22872} Anxiety Symptoms:  {BHH ANXIETY SYMPTOMS:22873} Psychotic Symptoms:  {BHH PSYCHOTIC SYMPTOMS:22874} PTSD Symptoms: {BHH PTSD SYMPTOMS:22875}  Past Psychiatric History:  Outpatient:  Psychiatry admission:  Previous suicide attempt:  Past trials of medication:  History of violence:   Previous Psychotropic Medications: {YES/NO:21197}  Substance Abuse History in the last 12 months:  {yes no:314532}  Consequences of Substance Abuse: {BHH CONSEQUENCES OF SUBSTANCE ABUSE:22880}  Past Medical History:  Past Medical History:  Diagnosis Date  . Anterolisthesis   . Anxiety    per pt, has panic attacks  . Arthritis    in back per pt.  . Arthritis of right hip   . Barrett's esophagus    egd 2007 + 2009 but none 2011 or 08/2010 egd  . Chronic headache   . Colon polyp    hyperplastic  . Depression    hx of suicide attempt in 2003  . Grief reaction   . Hidradenitis suppurativa   . IBS (irritable bowel syndrome)   . Migraine   . OSA (obstructive sleep apnea)   . Ovarian cyst   . Restless leg   . Tobacco abuse     Past Surgical History:  Procedure Laterality Date  . ABDOMINAL HYSTERECTOMY     secondary to endometriosis  . CESAREAN SECTION     x 2  . CHOLECYSTECTOMY    . COLONOSCOPY W/ POLYPECTOMY  2012  . ELBOW SURGERY     left  . LAPAROSCOPY  04-22-10   lysis of adhesions  . LAPAROSCOPY  04-30-10    with mini laparotomy and cystotomy repair  . plantar fascitis     right foot  . SALPINGECTOMY  04-22-10   right  . TOTAL HIP ARTHROPLASTY Right 04/25/2017   Procedure: RIGHT TOTAL HIP ARTHROPLASTY ANTERIOR APPROACH;  Surgeon: Rod Can, MD;  Location: [place];  Service: Orthopedics;  Laterality: Right;  Needs RNFA  . TUBAL LIGATION    . WRIST SURGERY     right/ganglion cyst    Family Psychiatric History: ***  Family History:  Family History  Problem Relation Age of Onset  . COPD Mother   . Stroke Mother   . Coronary artery disease Mother        pacer  . Colon polyps Mother   . Breast cancer Mother   . Migraines Mother   . Other Mother        RSDS  . Rheum arthritis Mother   . Hypertension Father   . Colon polyps Brother   . Colon cancer Maternal Grandfather   . Colon polyps Maternal Grandfather   . Breast cancer Maternal Aunt   . Uterine cancer Cousin        x 2  . Aneurysm Maternal Grandmother   . Kidney disease Brother   . Other Brother        back pain  . Kidney cancer Paternal Aunt   . Stomach cancer Neg Hx     Social History:   Social History   Socioeconomic History  . Marital  status: Divorced    Spouse name: Not on file  . Number of children: 2  . Years of education: Not on file  . Highest education level: Not on file  Occupational History  . Occupation: Chief Operating Officer  Social Needs  . Financial resource strain: Not on file  . Food insecurity:    Worry: Not on file    Inability: Not on file  . Transportation needs:    Medical: Not on file    Non-medical: Not on file  Tobacco Use  . Smoking status: Former Smoker    Packs/day: 0.50    Years: 30.00    Pack years: 15.00    Types: Cigarettes    Last attempt to quit: 06/21/2015    Years since quitting: 2.4  . Smokeless tobacco: Never Used  Substance and Sexual Activity  . Alcohol use: Yes    Alcohol/week: 0.0 standard drinks    Comment: 2 x a month  . Drug use: No  . Sexual activity: Not on file  Lifestyle  . Physical activity:    Days per week: Not on file     Minutes per session: Not on file  . Stress: Not on file  Relationships  . Social connections:    Talks on phone: Not on file    Gets together: Not on file    Attends religious service: Not on file    Active member of club or organization: Not on file    Attends meetings of clubs or organizations: Not on file    Relationship status: Not on file  Other Topics Concern  . Not on file  Social History Narrative   Occupation: bartender   Divorced  -- currently single   Alcohol use-yes -x 5 per weekend   No longer a smoker    Additional Social History: ***  Allergies:   Allergies  Allergen Reactions  . Morphine Hives and Nausea And Vomiting    INTOLERANCE >  N & V  . Penicillins Hives, Itching and Other (See Comments)     PATIENT HAS HAD A PCN REACTION WITH IMMEDIATE RASH, FACIAL/TONGUE/THROAT SWELLING, SOB, OR LIGHTHEADEDNESS WITH HYPOTENSION:  #  #  #  YES  #  #  #   Has patient had a PCN reaction causing severe rash involving mucus membranes or skin necrosis: Unknown Has patient had a PCN reaction that required hospitalization: No Has patient had a PCN reaction occurring within the last 10 years: No If all of the above answers are "NO", then may proceed with Cephalosporin use.     Metabolic Disorder Labs: Lab Results  Component Value Date   HGBA1C 5.3 08/03/2017   No results found for: PROLACTIN Lab Results  Component Value Date   CHOL 202 (H) 05/20/2016   TRIG 80.0 05/20/2016   HDL 55.50 05/20/2016   CHOLHDL 4 05/20/2016   VLDL 16.0 05/20/2016   LDLCALC 130 (H) 05/20/2016   LDLCALC 152 (H) 08/20/2014     Current Medications: Current Outpatient Medications  Medication Sig Dispense Refill  . ALPRAZolam (XANAX) 1 MG tablet 1/2 to one tablet at bedtime. 30 tablet 0  . aspirin 81 MG chewable tablet Chew 1 tablet (81 mg total) by mouth 2 (two) times daily. 60 tablet 1  . buPROPion (WELLBUTRIN XL) 150 MG 24 hr tablet QDLAM 90 tablet 1  . DULoxetine (CYMBALTA) 30 MG  capsule Take 2 capsules (60 mg total) by mouth at bedtime. 90 capsule 3  . lidocaine (XYLOCAINE) 2 % solution  Use as directed 20 mLs in the mouth or throat as needed.    . linaclotide (LINZESS) 290 MCG CAPS capsule TAKE 1 TABLET BY MOUTH DAILY    . nitroGLYCERIN (NITROSTAT) 0.4 MG SL tablet nitroglycerin 0.4 mg sublingual tablet  as needed for chest pain    . ondansetron (ZOFRAN) 4 MG tablet TAKE ONE TABLET BY MOUTH EVERY 8 HOURS AS NEEDED FOR NAUSEA OR VOMITING 30 tablet 4  . OVER THE COUNTER MEDICATION Take 1 tablet by mouth 3 (three) times daily. IBgard    . pantoprazole (PROTONIX) 40 MG tablet Take 1 tablet (40 mg total) by mouth daily. 90 tablet 3  . pregabalin (LYRICA) 100 MG capsule Take 1 capsule (100 mg total) by mouth 3 (three) times daily. 90 capsule 2  . pregabalin (LYRICA) 50 MG capsule Take 1 capsule (50 mg total) by mouth 3 (three) times daily. 90 capsule 1  . rOPINIRole (REQUIP) 1 MG tablet TAKE 2 TABLETS (2 MG TOTAL) BY MOUTH AT BEDTIME. 60 tablet 1  . rOPINIRole (REQUIP) 1 MG tablet TAKE 2 TABLETS (2 MG TOTAL) BY MOUTH AT BEDTIME. 60 tablet 1  . SUMAtriptan (IMITREX) 100 MG tablet PLEASE SEE ATTACHED FOR DETAILED DIRECTIONS 5 tablet 1  . topiramate (TOPAMAX) 100 MG tablet Take 150 mg by mouth at bedtime.    . topiramate (TOPAMAX) 50 MG tablet TAKE 3 TABLETS BY MOUTH EVERY DAY 270 tablet 1   No current facility-administered medications for this visit.     Neurologic: Headache: No Seizure: No Paresthesias:No  Musculoskeletal: Strength & Muscle Tone: within normal limits Gait & Station: normal Patient leans: N/A  Psychiatric Specialty Exam: ROS  There were no vitals taken for this visit.There is no height or weight on file to calculate BMI.  General Appearance: Fairly Groomed  Eye Contact:  Good  Speech:  Clear and Coherent  Volume:  Normal  Mood:  {BHH MOOD:22306}  Affect:  {Affect (PAA):22687}  Thought Process:  Coherent  Orientation:  Full (Time, Place, and  Person)  Thought Content:  Logical  Suicidal Thoughts:  {ST/HT (PAA):22692}  Homicidal Thoughts:  {ST/HT (PAA):22692}  Memory:  Immediate;   Good  Judgement:  {Judgement (PAA):22694}  Insight:  {Insight (PAA):22695}  Psychomotor Activity:  Normal  Concentration:  Concentration: Good and Attention Span: Good  Recall:  Good  Fund of Knowledge:Good  Language: Good  Akathisia:  No  Handed:  Right  AIMS (if indicated):  N/A  Assets:  Communication Skills Desire for Improvement  ADL's:  Intact  Cognition: WNL  Sleep:  ***   Assessment  Plan  The patient demonstrates the following risk factors for suicide: Chronic risk factors for suicide include: {Chronic Risk Factors for BSWHQPR:91638466}. Acute risk factors for suicide include: {Acute Risk Factors for ZLDJTTS:17793903}. Protective factors for this patient include: {Protective Factors for Suicide ESPQ:33007622}. Considering these factors, the overall suicide risk at this point appears to be {Desc; low/moderate/high:110033}. Patient {ACTION; IS/IS QJF:35456256} appropriate for outpatient follow up.   Treatment Plan Summary: Plan as above   Norman Clay, MD 9/30/201912:56 PM

## 2017-11-23 DIAGNOSIS — S76019A Strain of muscle, fascia and tendon of unspecified hip, initial encounter: Secondary | ICD-10-CM | POA: Insufficient documentation

## 2017-11-25 ENCOUNTER — Encounter: Payer: Self-pay | Admitting: Family Medicine

## 2017-11-25 ENCOUNTER — Other Ambulatory Visit: Payer: Self-pay | Admitting: Orthopedic Surgery

## 2017-11-25 DIAGNOSIS — S76011A Strain of muscle, fascia and tendon of right hip, initial encounter: Secondary | ICD-10-CM

## 2017-11-26 ENCOUNTER — Ambulatory Visit (HOSPITAL_COMMUNITY): Payer: Self-pay | Admitting: Psychiatry

## 2017-11-27 NOTE — Progress Notes (Deleted)
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  UPDATE: Intensity:  *** Duration:  *** Frequency:  *** Frequency of abortive medication: *** Current NSAIDS:  *** Current analgesics:  *** Current triptans:  Sumatriptan 100mg  Current ergotamine:  *** Current anti-emetic:  *** Current muscle relaxants:  *** Current anti-anxiolytic:  Alprazolam  Current sleep aide:  alprazolam Current Antihypertensive medications:  *** Current Antidepressant medications:  *** Current Anticonvulsant medications:  topiramate 150mg  at bedtime Current anti-CGRP:  *** Current Vitamins/Herbal/Supplements:  *** Current Antihistamines/Decongestants:  *** Other therapy:  *** Other medication:  ***  Caffeine:  no Alcohol:  no Smoker:  no Diet:  hydrates Exercise:  no Depression:  yes; Anxiety:  yes Other pain:  *** Sleep hygiene:  poor  HISTORY: MIGRAINE: Onset:  Around early 66s Location:  Top of head, band-like distribution Quality:  squeezing Initial Intensity:  7/10 (sometimes 10/10):  She denies new headache, worst headache of her life or severe headache that wakes her up from sleep. Aura:  no Prodrome:  no Associated symptoms:  Nausea, photophobia, phonophobia.  She denies unilateral numbness or weakness. Initial Duration:  4 hours or sometimes up to a day; December: 30 minutes Initial Frequency:  3 to 4 times a month; December: 3 days per month Triggers/exacerbating factors:  no Relieving factors:  Sleep in dark Activity:  Needs to lay down to sleep  Past NSAIDS:  ibuprofen Past analgesics:  Tylenol, Excedrin Past abortive triptans:  sumatriptan 25mg  (does not recall ever taking) Past muscle relaxants:no   Past anti-emetic:  Zofran 8mg , Phenergan Past sleep aide:  melatonin, Ambien Past antihypertensive medications:  no Past  antidepressant medications:  venlafaxine XR 150mg  (for depression).  Cymbalta (started briefly for two weeks) Past anticonvulsant medications:  gabapentin 1200mg  three times daily, Lyrica 100mg  three times daily (both for neuropathic nerve pain from hip surgery)   Family history of headache:  Mother and grandmother had migraines.  Grandmother had aneurysm.  In 2017, she was reporting episodes of dizziness that occur suddenly, usually while walking.  It is not positional.  It is not spinning but rather a wave-like movement.  There is no associated nausea, double vision, focal numbness.  It lasts for a few minutes and resolves.  It occurs twice a day.  She takes meclizine, which is not too effective. CTA of head was unremarkable, revealing no vertebrobasilar insufficiency.  Etiology did not seem neurologic.  MRI of brain without contrast from 06/05/15 and showed complex enlarged pineal gland over 1.9 cm with mild flattening of the superior colliculus.  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    Repeat MRI of the brain with and without contrast from 07/26/16 again demonstrated stable non-enhancing pineal gland cyst, but otherwise unremarkable except for some paranasal sinus disease.  PAST MEDICAL HISTORY: Past Medical History:  Diagnosis Date  . Anterolisthesis   . Anxiety    per pt, has panic attacks  . Arthritis    in back per pt.  . Arthritis of right hip   . Barrett's esophagus    egd 2007 + 2009 but none 2011 or 08/2010 egd  . Chronic headache   . Colon polyp    hyperplastic  . Depression  hx of suicide attempt in 2003  . Grief reaction   . Hidradenitis suppurativa   . IBS (irritable bowel syndrome)   . Migraine   . OSA (obstructive sleep apnea)   . Ovarian cyst   . Restless leg   . Tobacco abuse     MEDICATIONS: Current Outpatient Medications on File Prior to Visit    Medication Sig Dispense Refill  . ALPRAZolam (XANAX) 1 MG tablet 1/2 to one tablet at bedtime. 30 tablet 0  . aspirin 81 MG chewable tablet Chew 1 tablet (81 mg total) by mouth 2 (two) times daily. 60 tablet 1  . buPROPion (WELLBUTRIN XL) 150 MG 24 hr tablet QDLAM 90 tablet 1  . DULoxetine (CYMBALTA) 30 MG capsule Take 2 capsules (60 mg total) by mouth at bedtime. 90 capsule 3  . lidocaine (XYLOCAINE) 2 % solution Use as directed 20 mLs in the mouth or throat as needed.    . linaclotide (LINZESS) 290 MCG CAPS capsule TAKE 1 TABLET BY MOUTH DAILY    . nitroGLYCERIN (NITROSTAT) 0.4 MG SL tablet nitroglycerin 0.4 mg sublingual tablet  as needed for chest pain    . ondansetron (ZOFRAN) 4 MG tablet TAKE ONE TABLET BY MOUTH EVERY 8 HOURS AS NEEDED FOR NAUSEA OR VOMITING 30 tablet 4  . OVER THE COUNTER MEDICATION Take 1 tablet by mouth 3 (three) times daily. IBgard    . pantoprazole (PROTONIX) 40 MG tablet Take 1 tablet (40 mg total) by mouth daily. 90 tablet 3  . pregabalin (LYRICA) 100 MG capsule Take 1 capsule (100 mg total) by mouth 3 (three) times daily. 90 capsule 2  . pregabalin (LYRICA) 50 MG capsule Take 1 capsule (50 mg total) by mouth 3 (three) times daily. 90 capsule 1  . rOPINIRole (REQUIP) 1 MG tablet TAKE 2 TABLETS (2 MG TOTAL) BY MOUTH AT BEDTIME. 60 tablet 1  . rOPINIRole (REQUIP) 1 MG tablet TAKE 2 TABLETS (2 MG TOTAL) BY MOUTH AT BEDTIME. 60 tablet 1  . SUMAtriptan (IMITREX) 100 MG tablet PLEASE SEE ATTACHED FOR DETAILED DIRECTIONS 5 tablet 1  . topiramate (TOPAMAX) 100 MG tablet Take 150 mg by mouth at bedtime.    . topiramate (TOPAMAX) 50 MG tablet TAKE 3 TABLETS BY MOUTH EVERY DAY 270 tablet 1   No current facility-administered medications on file prior to visit.     ALLERGIES: Allergies  Allergen Reactions  . Morphine Hives and Nausea And Vomiting    INTOLERANCE >  N & V  . Penicillins Hives, Itching and Other (See Comments)     PATIENT HAS HAD A PCN REACTION WITH  IMMEDIATE RASH, FACIAL/TONGUE/THROAT SWELLING, SOB, OR LIGHTHEADEDNESS WITH HYPOTENSION:  #  #  #  YES  #  #  #   Has patient had a PCN reaction causing severe rash involving mucus membranes or skin necrosis: Unknown Has patient had a PCN reaction that required hospitalization: No Has patient had a PCN reaction occurring within the last 10 years: No If all of the above answers are "NO", then may proceed with Cephalosporin use.     FAMILY HISTORY: Family History  Problem Relation Age of Onset  . COPD Mother   . Stroke Mother   . Coronary artery disease Mother        pacer  . Colon polyps Mother   . Breast cancer Mother   . Migraines Mother   . Other Mother        RSDS  . Rheum arthritis Mother   .  Hypertension Father   . Colon polyps Brother   . Colon cancer Maternal Grandfather   . Colon polyps Maternal Grandfather   . Breast cancer Maternal Aunt   . Uterine cancer Cousin        x 2  . Aneurysm Maternal Grandmother   . Kidney disease Brother   . Other Brother        back pain  . Kidney cancer Paternal Aunt   . Stomach cancer Neg Hx    ***.  SOCIAL HISTORY: Social History   Socioeconomic History  . Marital status: Divorced    Spouse name: Not on file  . Number of children: 2  . Years of education: Not on file  . Highest education level: Not on file  Occupational History  . Occupation: Chief Operating Officer  Social Needs  . Financial resource strain: Not on file  . Food insecurity:    Worry: Not on file    Inability: Not on file  . Transportation needs:    Medical: Not on file    Non-medical: Not on file  Tobacco Use  . Smoking status: Former Smoker    Packs/day: 0.50    Years: 30.00    Pack years: 15.00    Types: Cigarettes    Last attempt to quit: 06/21/2015    Years since quitting: 2.4  . Smokeless tobacco: Never Used  Substance and Sexual Activity  . Alcohol use: Yes    Alcohol/week: 0.0 standard drinks    Comment: 2 x a month  . Drug use: No  . Sexual  activity: Not on file  Lifestyle  . Physical activity:    Days per week: Not on file    Minutes per session: Not on file  . Stress: Not on file  Relationships  . Social connections:    Talks on phone: Not on file    Gets together: Not on file    Attends religious service: Not on file    Active member of club or organization: Not on file    Attends meetings of clubs or organizations: Not on file    Relationship status: Not on file  . Intimate partner violence:    Fear of current or ex partner: Not on file    Emotionally abused: Not on file    Physically abused: Not on file    Forced sexual activity: Not on file  Other Topics Concern  . Not on file  Social History Narrative   Occupation: bartender   Divorced  -- currently single   Alcohol use-yes -x 5 per weekend   No longer a smoker    REVIEW OF SYSTEMS: Constitutional: No fevers, chills, or sweats, no generalized fatigue, change in appetite Eyes: No visual changes, double vision, eye pain Ear, nose and throat: No hearing loss, ear pain, nasal congestion, sore throat Cardiovascular: No chest pain, palpitations Respiratory:  No shortness of breath at rest or with exertion, wheezes GastrointestinaI: No nausea, vomiting, diarrhea, abdominal pain, fecal incontinence Genitourinary:  No dysuria, urinary retention or frequency Musculoskeletal:  No neck pain, back pain Integumentary: No rash, pruritus, skin lesions Neurological: as above Psychiatric: No depression, insomnia, anxiety Endocrine: No palpitations, fatigue, diaphoresis, mood swings, change in appetite, change in weight, increased thirst Hematologic/Lymphatic:  No purpura, petechiae. Allergic/Immunologic: no itchy/runny eyes, nasal congestion, recent allergic reactions, rashes  PHYSICAL EXAM: *** General: No acute distress.  Patient appears ***-groomed.  *** body habitus. Head:  Normocephalic/atraumatic Eyes:  Fundi examined but not visualized Neck: supple, no  paraspinal tenderness, full range of motion Heart:  Regular rate and rhythm Lungs:  Clear to auscultation bilaterally Back: No paraspinal tenderness Neurological Exam: alert and oriented to person, place, and time. Attention span and concentration intact, recent and remote memory intact, fund of knowledge intact.  Speech fluent and not dysarthric, language intact.  CN II-XII intact. Bulk and tone normal, muscle strength 5/5 throughout.  Sensation to light touch, temperature and vibration intact.  Deep tendon reflexes 2+ throughout, toes downgoing.  Finger to nose and heel to shin testing intact.  Gait normal, Romberg negative.  IMPRESSION: ***  PLAN: ***  Metta Clines, DO  CC: ***

## 2017-11-28 ENCOUNTER — Ambulatory Visit: Payer: BLUE CROSS/BLUE SHIELD | Admitting: Neurology

## 2017-11-28 ENCOUNTER — Encounter: Payer: Self-pay | Admitting: Neurology

## 2017-11-28 DIAGNOSIS — Z029 Encounter for administrative examinations, unspecified: Secondary | ICD-10-CM

## 2017-11-29 ENCOUNTER — Telehealth: Payer: Self-pay

## 2017-11-29 DIAGNOSIS — Z0279 Encounter for issue of other medical certificate: Secondary | ICD-10-CM

## 2017-11-29 NOTE — Telephone Encounter (Signed)
PPW received from Disability lawyer. Placed in your folder for review.

## 2017-11-30 ENCOUNTER — Ambulatory Visit
Admission: RE | Admit: 2017-11-30 | Discharge: 2017-11-30 | Disposition: A | Discharge status: Home / Self Care | Payer: BLUE CROSS/BLUE SHIELD | Source: Ambulatory Visit | Attending: Orthopedic Surgery | Admitting: Orthopedic Surgery

## 2017-11-30 DIAGNOSIS — S76011A Strain of muscle, fascia and tendon of right hip, initial encounter: Secondary | ICD-10-CM

## 2017-11-30 MED ORDER — IOPAMIDOL (ISOVUE-M 200) INJECTION 41%
1.0000 mL | Freq: Once | INTRAMUSCULAR | Status: AC
  Administered 2017-11-30: 1 mL

## 2017-11-30 MED ORDER — METHYLPREDNISOLONE ACETATE 40 MG/ML INJ SUSP (RADIOLOG
120.0000 mg | Freq: Once | INTRAMUSCULAR | Status: AC
  Administered 2017-11-30: 120 mg via INTRAMUSCULAR

## 2017-12-01 ENCOUNTER — Other Ambulatory Visit: Payer: Self-pay | Admitting: Family Medicine

## 2017-12-01 ENCOUNTER — Encounter: Payer: Self-pay | Admitting: Family Medicine

## 2017-12-01 ENCOUNTER — Ambulatory Visit (INDEPENDENT_AMBULATORY_CARE_PROVIDER_SITE_OTHER): Payer: BLUE CROSS/BLUE SHIELD | Admitting: Psychology

## 2017-12-01 DIAGNOSIS — F331 Major depressive disorder, recurrent, moderate: Secondary | ICD-10-CM

## 2017-12-01 MED ORDER — ALPRAZOLAM 1 MG PO TABS: ORAL_TABLET | ORAL | 0 refills | Status: DC

## 2017-12-01 NOTE — Telephone Encounter (Signed)
Please see message and advise 

## 2017-12-02 ENCOUNTER — Other Ambulatory Visit: Payer: Self-pay

## 2017-12-02 ENCOUNTER — Encounter: Payer: Self-pay | Admitting: Family Medicine

## 2017-12-02 DIAGNOSIS — K5909 Other constipation: Secondary | ICD-10-CM | POA: Insufficient documentation

## 2017-12-02 MED ORDER — ALPRAZOLAM 1 MG PO TABS: ORAL_TABLET | ORAL | 0 refills | Status: DC

## 2017-12-02 NOTE — Progress Notes (Signed)
Rx for Xanax 1 mg faxed to [facility] on [address].  Ok per Dr. Juleen China.

## 2017-12-02 NOTE — Telephone Encounter (Signed)
Completed and in the completed folder.

## 2017-12-02 NOTE — Telephone Encounter (Signed)
Printed off imaging and put in fax for it to be sent

## 2017-12-05 MED ORDER — ALPRAZOLAM 1 MG PO TABS: ORAL_TABLET | ORAL | 0 refills | Status: DC

## 2017-12-07 ENCOUNTER — Encounter: Payer: Self-pay | Admitting: Family Medicine

## 2017-12-07 DIAGNOSIS — M545 Low back pain: Secondary | ICD-10-CM

## 2017-12-07 DIAGNOSIS — G8929 Other chronic pain: Secondary | ICD-10-CM | POA: Insufficient documentation

## 2017-12-08 ENCOUNTER — Encounter: Payer: Self-pay | Admitting: Family Medicine

## 2017-12-08 ENCOUNTER — Ambulatory Visit (INDEPENDENT_AMBULATORY_CARE_PROVIDER_SITE_OTHER): Payer: BLUE CROSS/BLUE SHIELD

## 2017-12-08 DIAGNOSIS — G43709 Chronic migraine without aura, not intractable, without status migrainosus: Secondary | ICD-10-CM

## 2017-12-08 DIAGNOSIS — IMO0002 Reserved for concepts with insufficient information to code with codable children: Secondary | ICD-10-CM

## 2017-12-08 MED ORDER — KETOROLAC TROMETHAMINE 60 MG/2ML IM SOLN
60.0000 mg | Freq: Once | INTRAMUSCULAR | Status: AC
  Administered 2017-12-08: 60 mg via INTRAMUSCULAR

## 2017-12-08 MED ORDER — METOCLOPRAMIDE HCL 5 MG/ML IJ SOLN
10.0000 mg | Freq: Once | INTRAVENOUS | Status: AC
  Administered 2017-12-08: 10 mg via INTRAMUSCULAR

## 2017-12-08 MED ORDER — DIPHENHYDRAMINE HCL 50 MG/ML IJ SOLN
25.0000 mg | Freq: Once | INTRAMUSCULAR | Status: AC
  Administered 2017-12-08: 25 mg via INTRAMUSCULAR

## 2017-12-08 NOTE — Telephone Encounter (Signed)
See other message, My chart sent to pt needs to be seen.

## 2017-12-09 ENCOUNTER — Other Ambulatory Visit: Payer: Self-pay

## 2017-12-09 DIAGNOSIS — G43009 Migraine without aura, not intractable, without status migrainosus: Secondary | ICD-10-CM

## 2017-12-09 MED ORDER — PREDNISONE 10 MG (21) PO TBPK: ORAL_TABLET | ORAL | 0 refills | Status: DC

## 2017-12-12 ENCOUNTER — Ambulatory Visit: Payer: BLUE CROSS/BLUE SHIELD | Admitting: Pulmonary Disease

## 2017-12-12 ENCOUNTER — Encounter: Payer: Self-pay | Admitting: Pulmonary Disease

## 2017-12-12 VITALS — BP 118/78 | HR 87 | Ht 65.0 in | Wt 220.0 lb

## 2017-12-12 DIAGNOSIS — G8929 Other chronic pain: Secondary | ICD-10-CM

## 2017-12-12 DIAGNOSIS — F5105 Insomnia due to other mental disorder: Secondary | ICD-10-CM | POA: Diagnosis not present

## 2017-12-12 DIAGNOSIS — Z23 Encounter for immunization: Secondary | ICD-10-CM | POA: Diagnosis not present

## 2017-12-12 DIAGNOSIS — G2581 Restless legs syndrome: Secondary | ICD-10-CM

## 2017-12-12 DIAGNOSIS — Z9989 Dependence on other enabling machines and devices: Secondary | ICD-10-CM

## 2017-12-12 DIAGNOSIS — F418 Other specified anxiety disorders: Secondary | ICD-10-CM

## 2017-12-12 DIAGNOSIS — G4733 Obstructive sleep apnea (adult) (pediatric): Secondary | ICD-10-CM | POA: Diagnosis not present

## 2017-12-12 NOTE — Patient Instructions (Signed)
Flu shot today Follow up in 1 year 

## 2017-12-12 NOTE — Progress Notes (Signed)
[facility]  Chief Complaint  Patient presents with  . Follow-up    Pt is doing well overall with cpap machine and mask. She is not having dry mouth as much anymore since last ov. Pt has nights that she doesn't sleep at all, per pt she went 42 hrs no sleep .    Constitutional:  BP 118/78 (BP Location: Left Arm, Cuff Size: Normal)   Pulse 87   Ht 5\' 5"  (1.651 m)   Wt 220 lb (99.8 kg)   SpO2 97%   BMI 36.61 kg/m   Past Medical History:  Anxiety, Arthritis, Barrett's Esophagus, Chronic pain, Colon polyp, Depression Hidradenitis suppurativa, IBS, Ovarian cyst, RLS  Brief Summary:  [name] White is a 52 y.o. female with obstructive sleep apnea.  She had CPAP titration.  Did better with CPAP 7.  Not having as much mask leak or dry mouth.  Sleeping better with CPAP.  Still has hip and back pain that disrupt her sleep.  Also feels more depressed and anxious.  Gets migraine headaches that make it difficult for her to sleep.  As a result she has trouble keeping a regular sleep/wake schedule.  She has appt with behavioral health specialist coming up.  Physical Exam:   Appearance - well kempt  ENMT - clear nasal mucosa, midline nasal septum, no oral exudates, no LAN, trachea midline Respiratory - normal chest wall, normal respiratory effort, no accessory muscle use, no wheeze/rales CV - s1s2 regular rate and rhythm, no murmurs, no peripheral edema, radial pulses symmetric GI - soft, non tender, no masses Lymph - no adenopathy noted in neck and axillary areas MSK - normal muscle strength and tone, normal gait Ext - no cyanosis, clubbing, or joint inflammation noted Skin - no rashes, lesions, or ulcers Neuro - oriented to person, place, and time Psych - normal mood and affect   Assessment/Plan:   Obstructive sleep apnea. - she is compliant with CPAP and reports benefit - continue CPAP 7 cm H2O  Insomnia in setting of depression, anxiety,  restless legs and chronic pain. - she has appointment with behavioral health - lyrica, requip per primary care   Patient Instructions  Flu shot today  Follow up in 1 year    Chesley Mires, MD [place] Pager: [phone] 12/12/2017, 10:10 AM  Flow Sheet    Sleep tests:  PSG 10/07/11 >> AHI 17.8, SpO2 low 86%, PLMI 10.2 ONO with CPAP and RA 12/15/11 >> Test time 2 hrs 12 min. Mean SpO2 95%, low SpO2 83%. Spent 16 sec with SpO2 < 88% MSLT with CPAP 01/05/12 >> Mean sleep latency 12 min. Sleep in 5/5 naps, no SOREM's HST 01/14/16 >> AHI 9.2, SaO2 low 82% CPAP titration 10/02/17 >> CPAP 7 cm H2O >> AHI 0.4, +R, +S CPAP 11/08/17 to 12/07/17 >> used on 15 of 30 nights with average 5 hrs 2 min.  Average AHI 6.6 with CPAP 7 cm H2O.  Cardiac tests:  Echo 09/25/13 >> EF 50 to 55%   Medications:   Allergies as of 12/12/2017      Reactions   Morphine Hives, Nausea And Vomiting   INTOLERANCE >  N & V   Penicillins Hives, Itching, Other (See Comments)    PATIENT HAS HAD A PCN REACTION WITH IMMEDIATE RASH, FACIAL/TONGUE/THROAT SWELLING, SOB, OR LIGHTHEADEDNESS WITH HYPOTENSION:  #  #  #  YES  #  #  #   Has patient had a PCN  reaction causing severe rash involving mucus membranes or skin necrosis: Unknown Has patient had a PCN reaction that required hospitalization: No Has patient had a PCN reaction occurring within the last 10 years: No If all of the above answers are "NO", then may proceed with Cephalosporin use.      Medication List        Accurate as of 12/12/17 10:10 AM. Always use your most recent med list.          ALPRAZolam 1 MG tablet Commonly known as:  XANAX 1/2 to one tablet at bedtime.   buPROPion 150 MG 24 hr tablet Commonly known as:  WELLBUTRIN XL QDLAM   DULoxetine 30 MG capsule Commonly known as:  CYMBALTA Take 2 capsules (60 mg total) by mouth at bedtime.   LINZESS 290 MCG Caps capsule Generic drug:  linaclotide TAKE 1 TABLET BY  MOUTH DAILY   nitroGLYCERIN 0.4 MG SL tablet Commonly known as:  NITROSTAT nitroglycerin 0.4 mg sublingual tablet  as needed for chest pain   ondansetron 4 MG tablet Commonly known as:  ZOFRAN TAKE ONE TABLET BY MOUTH EVERY 8 HOURS AS NEEDED FOR NAUSEA OR VOMITING   pantoprazole 40 MG tablet Commonly known as:  PROTONIX Take 1 tablet (40 mg total) by mouth daily.   predniSONE 10 MG (21) Tbpk tablet Commonly known as:  STERAPRED UNI-PAK 21 TAB As directed   pregabalin 100 MG capsule Commonly known as:  LYRICA Take 1 capsule (100 mg total) by mouth 3 (three) times daily.   rOPINIRole 1 MG tablet Commonly known as:  REQUIP TAKE 2 TABLETS (2 MG TOTAL) BY MOUTH AT BEDTIME.   SUMAtriptan 100 MG tablet Commonly known as:  IMITREX PLEASE SEE ATTACHED FOR DETAILED DIRECTIONS   topiramate 50 MG tablet Commonly known as:  TOPAMAX TAKE 3 TABLETS BY MOUTH EVERY DAY       Past Surgical History:  She  has a past surgical history that includes Wrist surgery; Cesarean section; Cholecystectomy; Tubal ligation; plantar fascitis; Abdominal hysterectomy; laparoscopy (04-22-10); Elbow surgery; Salpingectomy (04-22-10); laparoscopy (04-30-10 ); Colonoscopy w/ polypectomy (2012); and Total hip arthroplasty (Right, 04/25/2017).  Family History:  Her family history includes Aneurysm in her maternal grandmother; Breast cancer in her maternal aunt and mother; COPD in her mother; Colon cancer in her maternal grandfather; Colon polyps in her brother, maternal grandfather, and mother; Coronary artery disease in her mother; Hypertension in her father; Kidney cancer in her paternal aunt; Kidney disease in her brother; Migraines in her mother; Other in her brother and mother; Rheum arthritis in her mother; Stroke in her mother; Uterine cancer in her cousin.  Social History:  She  reports that she quit smoking about 2 years ago. Her smoking use included cigarettes. She has a 15.00 pack-year smoking history. She  has never used smokeless tobacco. She reports that she drinks alcohol. She reports that she does not use drugs.

## 2017-12-22 ENCOUNTER — Ambulatory Visit (INDEPENDENT_AMBULATORY_CARE_PROVIDER_SITE_OTHER): Payer: BLUE CROSS/BLUE SHIELD | Admitting: Psychology

## 2017-12-22 DIAGNOSIS — F331 Major depressive disorder, recurrent, moderate: Secondary | ICD-10-CM

## 2017-12-23 ENCOUNTER — Other Ambulatory Visit: Payer: Self-pay | Admitting: Neurology

## 2017-12-27 ENCOUNTER — Telehealth: Payer: Self-pay

## 2017-12-27 NOTE — Telephone Encounter (Signed)
Rcvd fax from [facility] [address] sumatriptan 100 mg Rx sent in on 12/23/17 was missing instructions.  Called and spoke with Jone Baseman, gave verbal for #9 r3, take 1 tab at onset of headache, may repeat in 2 hours if needed, no more than 2/24

## 2017-12-29 ENCOUNTER — Encounter: Payer: Self-pay | Admitting: Family Medicine

## 2017-12-29 ENCOUNTER — Ambulatory Visit: Payer: BLUE CROSS/BLUE SHIELD | Admitting: Family Medicine

## 2017-12-29 ENCOUNTER — Ambulatory Visit (INDEPENDENT_AMBULATORY_CARE_PROVIDER_SITE_OTHER): Payer: BLUE CROSS/BLUE SHIELD

## 2017-12-29 VITALS — BP 102/70 | HR 92 | Temp 98.6°F | Ht 65.0 in | Wt 229.6 lb

## 2017-12-29 DIAGNOSIS — R05 Cough: Secondary | ICD-10-CM

## 2017-12-29 DIAGNOSIS — R059 Cough, unspecified: Secondary | ICD-10-CM

## 2017-12-29 MED ORDER — ALBUTEROL SULFATE HFA 108 (90 BASE) MCG/ACT IN AERS: 2.0000 | INHALATION_SPRAY | Freq: Four times a day (QID) | RESPIRATORY_TRACT | 0 refills | Status: DC | PRN

## 2017-12-29 MED ORDER — BENZONATATE 200 MG PO CAPS: 200.0000 mg | ORAL_CAPSULE | Freq: Three times a day (TID) | ORAL | 0 refills | Status: DC | PRN

## 2017-12-29 MED ORDER — AZITHROMYCIN 250 MG PO TABS: ORAL_TABLET | ORAL | 0 refills | Status: DC

## 2017-12-29 MED ORDER — GUAIFENESIN-CODEINE 100-10 MG/5ML PO SOLN: 10.0000 mL | Freq: Four times a day (QID) | ORAL | 0 refills | Status: DC | PRN

## 2017-12-29 NOTE — Progress Notes (Signed)
Patient: [name] White MRN: [id number] DOB: 16-May-1965 PCP: Briscoe Deutscher, DO     Subjective:  Chief Complaint  Patient presents with  . Cough    deep non productive cough since Sunday, denies head congestion, reports clear nasal mucus, c/o chest congestion, SOB, and post nasal drainage. She also reports wheezing. Has been seen by ENT in the past. She denies chills, body aches. She has tried NyQuil, Linus Salmons,     HPI: The patient is a 52 y.o. female who presents today for cough and URI symptoms. She reports she had a dry cough that started on Sunday and feels like she is wheezing. She will hear a noise and it drives her crazy. She denies any fever/chills, no ear pain, congestion, sinus/pain or pressure. She has a mild runny nose, but states that it hasn't really been that bad. She has no hx of asthma or copd. She does have OSA and has been unable to wear her mask due to her cough. She has had + sick contacts with her boyfriend. She does not smoke. She has taken nyquil, zicam and something DM that she has never heard of before. She completed steroid pack given by her neurologist for headache recently.   Review of Systems  Constitutional: Positive for appetite change and fatigue. Negative for chills and fever.  HENT: Positive for postnasal drip. Negative for congestion, ear pain, facial swelling, rhinorrhea, sinus pressure, sinus pain, sneezing, sore throat, tinnitus and trouble swallowing.   Eyes: Negative for photophobia, pain, redness and itching.  Respiratory: Positive for cough, chest tightness, shortness of breath and wheezing.   Cardiovascular: Negative for chest pain, palpitations and leg swelling.  Gastrointestinal: Positive for constipation and diarrhea. Negative for nausea and vomiting.  Genitourinary: Negative for difficulty urinating, frequency, hematuria and pelvic pain.  Skin: Negative for rash.  Allergic/Immunologic: Negative for environmental allergies, food allergies and  immunocompromised state.  Neurological: Positive for headaches. Negative for dizziness.    Allergies Patient is allergic to morphine and penicillins.  Past Medical History Patient  has a past medical history of Anterolisthesis, Anxiety, Arthritis, Arthritis of right hip, Barrett's esophagus, Chronic headache, Colon polyp, Depression, Grief reaction, Hidradenitis suppurativa, IBS (irritable bowel syndrome), Migraine, OSA (obstructive sleep apnea), Ovarian cyst, Restless leg, and Tobacco abuse.  Surgical History Patient  has a past surgical history that includes Wrist surgery; Cesarean section; Cholecystectomy; Tubal ligation; plantar fascitis; Abdominal hysterectomy; laparoscopy (04-22-10); Elbow surgery; Salpingectomy (04-22-10); laparoscopy (04-30-10 ); Colonoscopy w/ polypectomy (2012); and Total hip arthroplasty (Right, 04/25/2017).  Family History Pateint's family history includes Aneurysm in her maternal grandmother; Breast cancer in her maternal aunt and mother; COPD in her mother; Colon cancer in her maternal grandfather; Colon polyps in her brother, maternal grandfather, and mother; Coronary artery disease in her mother; Hypertension in her father; Kidney cancer in her paternal aunt; Kidney disease in her brother; Migraines in her mother; Other in her brother and mother; Rheum arthritis in her mother; Stroke in her mother; Uterine cancer in her cousin.  Social History Patient  reports that she quit smoking about 2 years ago. Her smoking use included cigarettes. She has a 15.00 pack-year smoking history. She has never used smokeless tobacco. She reports that she drinks alcohol. She reports that she does not use drugs.    Objective: Vitals:   12/29/17 1504  BP: 102/70  Pulse: 92  Temp: 98.6 F (37 C)  TempSrc: Oral  SpO2: 97%  Weight: 229 lb 9.6 oz (104.1 kg)  Height: 5'  5" (1.651 m)    Body mass index is 38.21 kg/m.  Physical Exam  Constitutional: She appears well-developed and  well-nourished.  HENT:  Right Ear: External ear normal.  Left Ear: External ear normal.  Cobblestoning on posterior pharynx  TTP over left maxillary sinus  Nasal turbinate moderately edematous on right   Eyes: Pupils are equal, round, and reactive to light. EOM are normal.  Cardiovascular: Normal rate, regular rhythm and normal heart sounds.  Pulmonary/Chest: Effort normal. She has no wheezes.  Course breath sounds RLL   Abdominal: Soft. Bowel sounds are normal.  Lymphadenopathy:    She has no cervical adenopathy.  Vitals reviewed.    CXR: bronchograms. Airway disease. . No consolidation. Official read pending.   Assessment/plan: 1. Cough Likely bronchitis. Discussed usually viral so will do supportive therapy at this time. Recommended honey, cool mist humidifier/cpap if she can tolerate, robitussin DM, codeine cough syrup at night and tessalon pearls prn. Since it's the weekend, will send in zpack for her to start if fever, worsening symptoms or >10 days. Let us know if not getting better or change in symptoms.  - DG Chest 2 View; Future   Return if symptoms worsen or fail to improve.     Orma Flaming, MD [place]  12/29/2017

## 2017-12-29 NOTE — Patient Instructions (Signed)
Sending in tessalon pearls for cough, can take as needed up to three times/day Honey is good for cough. 1 tablespoon/day Cool mist humidifier Robitussin DM is my favorite cough syrup and sending in codeine cough syrup at night. May make you drowsy zpack to start if not better, fevers, worsening symptoms Inhaler for cough/feeling tight.   Acute Bronchitis, Adult Acute bronchitis is when air tubes (bronchi) in the lungs suddenly get swollen. The condition can make it hard to breathe. It can also cause these symptoms:  A cough.  Coughing up clear, yellow, or green mucus.  Wheezing.  Chest congestion.  Shortness of breath.  A fever.  Body aches.  Chills.  A sore throat.  Follow these instructions at home: Medicines  Take over-the-counter and prescription medicines only as told by your doctor.  If you were prescribed an antibiotic medicine, take it as told by your doctor. Do not stop taking the antibiotic even if you start to feel better. General instructions  Rest.  Drink enough fluids to keep your pee (urine) clear or pale yellow.  Avoid smoking and secondhand smoke. If you smoke and you need help quitting, ask your doctor. Quitting will help your lungs heal faster.  Use an inhaler, cool mist vaporizer, or humidifier as told by your doctor.  Keep all follow-up visits as told by your doctor. This is important. How is this prevented? To lower your risk of getting this condition again:  Wash your hands often with soap and water. If you cannot use soap and water, use hand sanitizer.  Avoid contact with people who have cold symptoms.  Try not to touch your hands to your mouth, nose, or eyes.  Make sure to get the flu shot every year.  Contact a doctor if:  Your symptoms do not get better in 2 weeks. Get help right away if:  You cough up blood.  You have chest pain.  You have very bad shortness of breath.  You become dehydrated.  You faint (pass out) or  keep feeling like you are going to pass out.  You keep throwing up (vomiting).  You have a very bad headache.  Your fever or chills gets worse. This information is not intended to replace advice given to you by your health care provider. Make sure you discuss any questions you have with your health care provider. Document Released: 07/28/2007 Document Revised: 09/17/2015 Document Reviewed: 07/30/2015 Elsevier Interactive Patient Education  [employer].

## 2018-01-02 NOTE — Progress Notes (Deleted)
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  UPDATE: Intensity:  *** Duration:  *** Frequency:  *** Last month, they became ***.  She was treated with both prednisone taper and headache cocktail (Toradol 60mg Lenard Galloway 25mg /Reglan 10mg ).  *** Frequency of abortive medication: *** Current NSAIDS: None Current analgesics: None Current triptans: Sumatriptan 100 mg Current ergotamine: None Current anti-emetic: None Current muscle relaxants: None Current anti-anxiolytic: Alprazolam Current sleep aide: Alprazolam Current Antihypertensive medications: None Current Antidepressant medications: None Current Anticonvulsant medications: Topiramate 150 mg at bedtime Current anti-CGRP: None Current Vitamins/Herbal/Supplements: None Current Antihistamines/Decongestants: None Other therapy: None Hormone/birth control: ***  Caffeine: No Diet: Increased water, decrease chocolate Exercise: No Depression: Yes; Anxiety: Yes Other pain: Back and hip pain Sleep hygiene: Poor  HISTORY: Onset: Around early 56s Location:  Top of head, band-like distribution Quality:  squeezing Initial Intensity:  7/10 (sometimes 10/10):  She denies new headache, worst headache of her life or severe headache that wakes her up from sleep. Aura:  no Prodrome:  no Associated symptoms: Nausea, photophobia, phonophobia.  She denies unilateral numbness or weakness. Initial Duration:  4 hours or sometimes up to a day; December: 30 minutes Initial Frequency:  3 to 4 times a month; December: 3 days per month Triggers: None Relieving factors:  Sleep in dark Activity:  Needs to lay down to sleep  Past NSAIDS:  ibuprofen Past analgesics:  Tylenol, Excedrin Past abortive triptans:  sumatriptan 25mg  (does not recall ever taking) Past muscle relaxants:no   Past  anti-emetic:  Zofran 8mg , Phenergan Past sleep aide:  melatonin, Ambien Past antihypertensive medications:  no Past antidepressant medications:  venlafaxine XR 150mg  (for depression).  Cymbalta (started briefly for two weeks) Past anticonvulsant medications:  gabapentin 1200mg  three times daily, Lyrica 100mg  three times daily (both for neuropathic nerve pain from hip surgery)   Family history of headache:  Mother and grandmother had migraines.  Grandmother had aneurysm.  In 2017, she was reporting episodes of dizziness that occur suddenly, usually while walking.  It is not positional.  It is not spinning but rather a wave-like movement.  There is no associated nausea, double vision, focal numbness.  It lasts for a few minutes and resolves.  It occurs twice a day.  She takes meclizine, which is not too effective. CTA of head was unremarkable, revealing no vertebrobasilar insufficiency.  Etiology did not seem neurologic.  MRI of brain without contrast from 06/05/15 and showed complex enlarged pineal gland over 1.9 cm with mild flattening of the superior colliculus.  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    Repeat MRI of the brain with and without contrast from 07/26/16 again demonstrated stable non-enhancing pineal gland cyst, but otherwise unremarkable except for some paranasal sinus disease.  PAST MEDICAL HISTORY: Past Medical History:  Diagnosis Date  . Anterolisthesis   . Anxiety    per pt, has panic attacks  . Arthritis    in back per pt.  . Arthritis of right hip   . Barrett's esophagus    egd 2007 + 2009 but none 2011 or 08/2010 egd  . Chronic headache   . Colon polyp    hyperplastic  . Depression    hx of suicide attempt in 2003  .  Grief reaction   . Hidradenitis suppurativa   . IBS (irritable bowel syndrome)   . Migraine   . OSA (obstructive sleep apnea)   . Ovarian  cyst   . Restless leg   . Tobacco abuse     MEDICATIONS: Current Outpatient Medications on File Prior to Visit  Medication Sig Dispense Refill  . albuterol (PROAIR HFA) 108 (90 Base) MCG/ACT inhaler Inhale 2 puffs into the lungs every 6 (six) hours as needed for wheezing or shortness of breath. 1 Inhaler 0  . ALPRAZolam (XANAX) 1 MG tablet 1/2 to one tablet at bedtime. 30 tablet 0  . azithromycin (ZITHROMAX) 250 MG tablet 2 pills today and then 1 pill days 2-5. 6 tablet 0  . benzonatate (TESSALON) 200 MG capsule Take 1 capsule (200 mg total) by mouth 3 (three) times daily as needed for cough. 30 capsule 0  . buPROPion (WELLBUTRIN XL) 150 MG 24 hr tablet QDLAM 90 tablet 1  . DULoxetine (CYMBALTA) 30 MG capsule Take 2 capsules (60 mg total) by mouth at bedtime. 90 capsule 3  . guaiFENesin-codeine 100-10 MG/5ML syrup Take 10 mLs by mouth every 6 (six) hours as needed for cough. 120 mL 0  . linaclotide (LINZESS) 290 MCG CAPS capsule TAKE 1 TABLET BY MOUTH DAILY    . nitroGLYCERIN (NITROSTAT) 0.4 MG SL tablet nitroglycerin 0.4 mg sublingual tablet  as needed for chest pain    . ondansetron (ZOFRAN) 4 MG tablet TAKE ONE TABLET BY MOUTH EVERY 8 HOURS AS NEEDED FOR NAUSEA OR VOMITING 30 tablet 4  . pantoprazole (PROTONIX) 40 MG tablet Take 1 tablet (40 mg total) by mouth daily. 90 tablet 3  . pregabalin (LYRICA) 100 MG capsule Take 1 capsule (100 mg total) by mouth 3 (three) times daily. 90 capsule 2  . rOPINIRole (REQUIP) 1 MG tablet TAKE 2 TABLETS (2 MG TOTAL) BY MOUTH AT BEDTIME. 60 tablet 1  . SUMAtriptan (IMITREX) 100 MG tablet PLEASE SEE ATTACHED FOR DETAILED DIRECTIONS 5 tablet 1  . topiramate (TOPAMAX) 50 MG tablet TAKE 3 TABLETS BY MOUTH EVERY DAY 270 tablet 1   No current facility-administered medications on file prior to visit.     ALLERGIES: Allergies  Allergen Reactions  . Morphine Hives and Nausea And Vomiting    INTOLERANCE >  N & V  . Penicillins Hives, Itching and Other (See  Comments)     PATIENT HAS HAD A PCN REACTION WITH IMMEDIATE RASH, FACIAL/TONGUE/THROAT SWELLING, SOB, OR LIGHTHEADEDNESS WITH HYPOTENSION:  #  #  #  YES  #  #  #   Has patient had a PCN reaction causing severe rash involving mucus membranes or skin necrosis: Unknown Has patient had a PCN reaction that required hospitalization: No Has patient had a PCN reaction occurring within the last 10 years: No If all of the above answers are "NO", then may proceed with Cephalosporin use.     FAMILY HISTORY: Family History  Problem Relation Age of Onset  . COPD Mother   . Stroke Mother   . Coronary artery disease Mother        pacer  . Colon polyps Mother   . Breast cancer Mother   . Migraines Mother   . Other Mother        RSDS  . Rheum arthritis Mother   . Hypertension Father   . Colon polyps Brother   . Colon cancer Maternal Grandfather   . Colon polyps Maternal Grandfather   . Breast cancer  Maternal Aunt   . Uterine cancer Cousin        x 2  . Aneurysm Maternal Grandmother   . Kidney disease Brother   . Other Brother        back pain  . Kidney cancer Paternal Aunt   . Stomach cancer Neg Hx    SOCIAL HISTORY: Social History   Socioeconomic History  . Marital status: Divorced    Spouse name: Not on file  . Number of children: 2  . Years of education: Not on file  . Highest education level: Not on file  Occupational History  . Occupation: Chief Operating Officer  Social Needs  . Financial resource strain: Not on file  . Food insecurity:    Worry: Not on file    Inability: Not on file  . Transportation needs:    Medical: Not on file    Non-medical: Not on file  Tobacco Use  . Smoking status: Former Smoker    Packs/day: 0.50    Years: 30.00    Pack years: 15.00    Types: Cigarettes    Last attempt to quit: 06/21/2015    Years since quitting: 2.5  . Smokeless tobacco: Never Used  Substance and Sexual Activity  . Alcohol use: Yes    Alcohol/week: 0.0 standard drinks    Comment: 2  x a month  . Drug use: No  . Sexual activity: Not on file  Lifestyle  . Physical activity:    Days per week: Not on file    Minutes per session: Not on file  . Stress: Not on file  Relationships  . Social connections:    Talks on phone: Not on file    Gets together: Not on file    Attends religious service: Not on file    Active member of club or organization: Not on file    Attends meetings of clubs or organizations: Not on file    Relationship status: Not on file  . Intimate partner violence:    Fear of current or ex partner: Not on file    Emotionally abused: Not on file    Physically abused: Not on file    Forced sexual activity: Not on file  Other Topics Concern  . Not on file  Social History Narrative   Occupation: bartender   Divorced  -- currently single   Alcohol use-yes -x 5 per weekend   No longer a smoker    REVIEW OF SYSTEMS: Constitutional: No fevers, chills, or sweats, no generalized fatigue, change in appetite Eyes: No visual changes, double vision, eye pain Ear, nose and throat: No hearing loss, ear pain, nasal congestion, sore throat Cardiovascular: No chest pain, palpitations Respiratory:  No shortness of breath at rest or with exertion, wheezes GastrointestinaI: No nausea, vomiting, diarrhea, abdominal pain, fecal incontinence Genitourinary:  No dysuria, urinary retention or frequency Musculoskeletal:  No neck pain, back pain Integumentary: No rash, pruritus, skin lesions Neurological: as above Psychiatric: No depression, insomnia, anxiety Endocrine: No palpitations, fatigue, diaphoresis, mood swings, change in appetite, change in weight, increased thirst Hematologic/Lymphatic:  No purpura, petechiae. Allergic/Immunologic: no itchy/runny eyes, nasal congestion, recent allergic reactions, rashes  PHYSICAL EXAM: *** General: No acute distress.  Patient appears ***-groomed.  *** body habitus. Head:  Normocephalic/atraumatic Eyes:  Fundi examined but  not visualized Neck: supple, no paraspinal tenderness, full range of motion Heart:  Regular rate and rhythm Lungs:  Clear to auscultation bilaterally Back: No paraspinal tenderness Neurological Exam: alert and oriented to  person, place, and time. Attention span and concentration intact, recent and remote memory intact, fund of knowledge intact.  Speech fluent and not dysarthric, language intact.  CN II-XII intact. Bulk and tone normal, muscle strength 5/5 throughout.  Sensation to light touch, temperature and vibration intact.  Deep tendon reflexes 2+ throughout, toes downgoing.  Finger to nose and heel to shin testing intact.  Gait normal, Romberg negative.  IMPRESSION: 1.  Migraine without aura, without status migrainosus, not intractable 2.  Depression  PLAN: 1.  For preventative management, *** 2.  For abortive therapy, *** 3.  Limit use of pain relievers to no more than 2 days out of week to prevent risk of rebound or medication-overuse headache. 4.  Keep headache diary 5.  Exercise, hydration, caffeine cessation, sleep hygiene, monitor for and avoid triggers 6.  Consider:  magnesium citrate 400mg  daily, riboflavin 400mg  daily, and coenzyme Q10 100mg  three times daily 7.  Follow up ***  Metta Clines, DO  CC: ***

## 2018-01-03 ENCOUNTER — Ambulatory Visit: Payer: BLUE CROSS/BLUE SHIELD | Admitting: Neurology

## 2018-01-03 ENCOUNTER — Telehealth: Payer: Self-pay | Admitting: Family Medicine

## 2018-01-03 NOTE — Telephone Encounter (Signed)
Please advise do I need to have patient come back in?

## 2018-01-03 NOTE — Telephone Encounter (Signed)
Copied from [place] [phone]. Topic: General - Other >> Jan 03, 2018 12:26 PM Lennox Solders wrote: Reason for CRM: pt saw dr Rogers Blocker on 12-29-17. Pt has finished the abx today and still has cough medication left. Pt is still not coughing anything up. Please advise.. Pt has taken her temp its was normal. Pt feels hot. Pt did not go to work yesterday but she is out work now.

## 2018-01-04 NOTE — Telephone Encounter (Signed)
Try inhaler and come in if not improving.

## 2018-01-04 NOTE — Progress Notes (Signed)
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  UPDATE: Headaches had been controlled (1 to 2 a month) until last month, when she began drinking tea frequently and less water.  Then the migraines became worse and started to occur daily.  She was treated with both prednisone taper and headache cocktail (Toradol 60mg Lenard Galloway 25mg /Reglan 10mg ).  She stopped the tea and has increased the water intake and headaches stopped at end of October.  She had two moderate migraines over the past 2 weeks, aborted in 30 to 45 minutes with sumatriptan.  Then she caught bronchitis.  She also reports sudden severe bolt type headaches on the top (bilateral parietal region) of the head.  They occur about twice a month. Current NSAIDS: None Current analgesics: None Current triptans: Sumatriptan 100 mg Current ergotamine: None Current anti-emetic: None Current muscle relaxants: None Current anti-anxiolytic: Alprazolam Current sleep aide: Alprazolam Current Antihypertensive medications: None Current Antidepressant medications: None Current Anticonvulsant medications: Topiramate 150 mg at bedtime Current anti-CGRP: None Current Vitamins/Herbal/Supplements: None Current Antihistamines/Decongestants: None Other therapy: None Hormone/birth control: none  Caffeine: No Diet: Increased water, decrease chocolate Exercise: No Depression: Yes; Anxiety: Yes Other pain: Back and hip pain Sleep hygiene: Poor  HISTORY: Onset: Around early 50s Location:  Top of head, band-like distribution Quality:  squeezing Initial Intensity:  7/10 (sometimes 10/10):  She denies new headache, worst headache of her life or severe headache that wakes her up from sleep. Aura:  no Prodrome:  no Associated symptoms: Nausea, photophobia, phonophobia.  She denies unilateral numbness or  weakness. Initial Duration:  4 hours or sometimes up to a day; December: 30 minutes Initial Frequency:  3 to 4 times a month; December: 3 days per month Triggers: Tea Relieving factors:  Sleep in dark Activity:  Needs to lay down to sleep  Past NSAIDS:  ibuprofen Past analgesics:  Tylenol, Excedrin Past abortive triptans:  sumatriptan 25mg  (does not recall ever taking) Past muscle relaxants:no   Past anti-emetic:  Zofran 8mg , Phenergan Past sleep aide:  melatonin, Ambien Past antihypertensive medications:  no Past antidepressant medications:  venlafaxine XR 150mg  (for depression).  Cymbalta (started briefly for two weeks) Past anticonvulsant medications:  gabapentin 1200mg  three times daily, Lyrica 100mg  three times daily (both for neuropathic nerve pain from hip surgery)   Family history of headache:  Mother and grandmother had migraines.  Grandmother had aneurysm.  In 2017, she was reporting episodes of dizziness that occur suddenly, usually while walking.  It is not positional.  It is not spinning but rather a wave-like movement.  There is no associated nausea, double vision, focal numbness.  It lasts for a few minutes and resolves.  It occurs twice a day.  She takes meclizine, which is not too effective. CTA of head was unremarkable, revealing no vertebrobasilar insufficiency.  Etiology did not seem neurologic.  MRI of brain without contrast from 06/05/15 and showed complex enlarged pineal gland over 1.9 cm with mild flattening of the superior colliculus.  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    Repeat MRI of the brain with and without contrast from 07/26/16 again demonstrated stable non-enhancing pineal gland cyst, but otherwise unremarkable except for some paranasal sinus disease.  PAST MEDICAL HISTORY: Past Medical  History:  Diagnosis Date  . Anterolisthesis   . Anxiety     per pt, has panic attacks  . Arthritis    in back per pt.  . Arthritis of right hip   . Barrett's esophagus    egd 2007 + 2009 but none 2011 or 08/2010 egd  . Chronic headache   . Colon polyp    hyperplastic  . Depression    hx of suicide attempt in 2003  . Grief reaction   . Hidradenitis suppurativa   . IBS (irritable bowel syndrome)   . Migraine   . OSA (obstructive sleep apnea)   . Ovarian cyst   . Restless leg   . Tobacco abuse     MEDICATIONS: Current Outpatient Medications on File Prior to Visit  Medication Sig Dispense Refill  . albuterol (PROAIR HFA) 108 (90 Base) MCG/ACT inhaler Inhale 2 puffs into the lungs every 6 (six) hours as needed for wheezing or shortness of breath. 1 Inhaler 0  . ALPRAZolam (XANAX) 1 MG tablet 1/2 to one tablet at bedtime. 30 tablet 0  . azithromycin (ZITHROMAX) 250 MG tablet 2 pills today and then 1 pill days 2-5. 6 tablet 0  . benzonatate (TESSALON) 200 MG capsule Take 1 capsule (200 mg total) by mouth 3 (three) times daily as needed for cough. 30 capsule 0  . buPROPion (WELLBUTRIN XL) 150 MG 24 hr tablet QDLAM 90 tablet 1  . DULoxetine (CYMBALTA) 30 MG capsule Take 2 capsules (60 mg total) by mouth at bedtime. 90 capsule 3  . guaiFENesin-codeine 100-10 MG/5ML syrup Take 10 mLs by mouth every 6 (six) hours as needed for cough. 120 mL 0  . linaclotide (LINZESS) 290 MCG CAPS capsule TAKE 1 TABLET BY MOUTH DAILY    . nitroGLYCERIN (NITROSTAT) 0.4 MG SL tablet nitroglycerin 0.4 mg sublingual tablet  as needed for chest pain    . ondansetron (ZOFRAN) 4 MG tablet TAKE ONE TABLET BY MOUTH EVERY 8 HOURS AS NEEDED FOR NAUSEA OR VOMITING 30 tablet 4  . pantoprazole (PROTONIX) 40 MG tablet Take 1 tablet (40 mg total) by mouth daily. 90 tablet 3  . pregabalin (LYRICA) 100 MG capsule Take 1 capsule (100 mg total) by mouth 3 (three) times daily. 90 capsule 2  . rOPINIRole (REQUIP) 1 MG tablet TAKE 2 TABLETS (2 MG TOTAL) BY MOUTH AT BEDTIME. 60 tablet 1    . SUMAtriptan (IMITREX) 100 MG tablet PLEASE SEE ATTACHED FOR DETAILED DIRECTIONS 5 tablet 1  . topiramate (TOPAMAX) 50 MG tablet TAKE 3 TABLETS BY MOUTH EVERY DAY 270 tablet 1   No current facility-administered medications on file prior to visit.     ALLERGIES: Allergies  Allergen Reactions  . Morphine Hives and Nausea And Vomiting    INTOLERANCE >  N & V  . Penicillins Hives, Itching and Other (See Comments)     PATIENT HAS HAD A PCN REACTION WITH IMMEDIATE RASH, FACIAL/TONGUE/THROAT SWELLING, SOB, OR LIGHTHEADEDNESS WITH HYPOTENSION:  #  #  #  YES  #  #  #   Has patient had a PCN reaction causing severe rash involving mucus membranes or skin necrosis: Unknown Has patient had a PCN reaction that required hospitalization: No Has patient had a PCN reaction occurring within the last 10 years: No If all of the above answers are "NO", then may proceed with Cephalosporin use.     FAMILY HISTORY: Family History  Problem Relation Age of Onset  . COPD Mother   .  Stroke Mother   . Coronary artery disease Mother        pacer  . Colon polyps Mother   . Breast cancer Mother   . Migraines Mother   . Other Mother        RSDS  . Rheum arthritis Mother   . Hypertension Father   . Colon polyps Brother   . Colon cancer Maternal Grandfather   . Colon polyps Maternal Grandfather   . Breast cancer Maternal Aunt   . Uterine cancer Cousin        x 2  . Aneurysm Maternal Grandmother   . Kidney disease Brother   . Other Brother        back pain  . Kidney cancer Paternal Aunt   . Stomach cancer Neg Hx    SOCIAL HISTORY: Social History   Socioeconomic History  . Marital status: Divorced    Spouse name: Not on file  . Number of children: 2  . Years of education: Not on file  . Highest education level: Not on file  Occupational History  . Occupation: Chief Operating Officer  Social Needs  . Financial resource strain: Not on file  . Food insecurity:    Worry: Not on file    Inability: Not on  file  . Transportation needs:    Medical: Not on file    Non-medical: Not on file  Tobacco Use  . Smoking status: Former Smoker    Packs/day: 0.50    Years: 30.00    Pack years: 15.00    Types: Cigarettes    Last attempt to quit: 06/21/2015    Years since quitting: 2.5  . Smokeless tobacco: Never Used  Substance and Sexual Activity  . Alcohol use: Yes    Alcohol/week: 0.0 standard drinks    Comment: 2 x a month  . Drug use: No  . Sexual activity: Not on file  Lifestyle  . Physical activity:    Days per week: Not on file    Minutes per session: Not on file  . Stress: Not on file  Relationships  . Social connections:    Talks on phone: Not on file    Gets together: Not on file    Attends religious service: Not on file    Active member of club or organization: Not on file    Attends meetings of clubs or organizations: Not on file    Relationship status: Not on file  . Intimate partner violence:    Fear of current or ex partner: Not on file    Emotionally abused: Not on file    Physically abused: Not on file    Forced sexual activity: Not on file  Other Topics Concern  . Not on file  Social History Narrative   Occupation: bartender   Divorced  -- currently single   Alcohol use-yes -x 5 per weekend   No longer a smoker    REVIEW OF SYSTEMS: Constitutional: No fevers, chills, or sweats, no generalized fatigue, change in appetite Eyes: No visual changes, double vision, eye pain Ear, nose and throat: upper respiratory symptoms Cardiovascular: No chest pain, palpitations Respiratory:  No shortness of breath at rest or with exertion, wheezes GastrointestinaI: No nausea, vomiting, diarrhea, abdominal pain, fecal incontinence Genitourinary:  No dysuria, urinary retention or frequency Musculoskeletal:  No neck pain, back pain Integumentary: No rash, pruritus, skin lesions Neurological: as above Psychiatric: as above Endocrine: No palpitations, fatigue, diaphoresis, mood  swings, change in appetite, change in weight, increased  thirst Hematologic/Lymphatic:  No purpura, petechiae. Allergic/Immunologic: no itchy/runny eyes, nasal congestion, recent allergic reactions, rashes  PHYSICAL EXAM: Blood pressure 108/90, pulse 88, height 5\' 5"  (1.651 m), weight 233 lb (105.7 kg), SpO2 97 %. General: No acute distress.  Patient appears well-groomed.   Head:  Normocephalic/atraumatic Eyes:  Fundi examined but not visualized Neck: supple, no paraspinal tenderness, full range of motion Heart:  Regular rate and rhythm Lungs:  Clear to auscultation bilaterally Back: No paraspinal tenderness Neurological Exam: alert and oriented to person, place, and time. Attention span and concentration intact, recent and remote memory intact, fund of knowledge intact.  Speech fluent and not dysarthric, language intact.  CN II-XII intact. Bulk and tone normal, muscle strength 5/5 throughout.  Sensation to light touch, temperature and vibration intact.  Deep tendon reflexes 2+ throughout, toes downgoing.  Finger to nose and heel to shin testing intact.  Gait normal, Romberg negative.  IMPRESSION: 1.  Migraine without aura, without status migrainosus, not intractable 2.  Probable primary stabbing headache, infrequent  PLAN: 1.  To help reduce frequency of stabbing headache, increase topiramate to 200mg  at bedtime 2.  For abortive therapy, sumatriptan 3.  Limit use of pain relievers to no more than 2 days out of week to prevent risk of rebound or medication-overuse headache. 4.  Keep headache diary 5.  Exercise, hydration, caffeine cessation, sleep hygiene, monitor for and avoid triggers 6.  Consider:  magnesium citrate 400mg  daily, riboflavin 400mg  daily, and coenzyme Q10 100mg  three times daily 7.  Follow up in 6 months.  Metta Clines, DO  CC: Briscoe Deutscher, DO

## 2018-01-04 NOTE — Telephone Encounter (Signed)
Patient has gone back to work. She wanted to know if anything she can do for cough. She has pearls and inhaler that and syrup that is not helping much. She has not been using inhaler as directed but will start now.

## 2018-01-04 NOTE — Telephone Encounter (Signed)
Check in with her. Could just take longer to improve. Okay work note if needed.

## 2018-01-05 ENCOUNTER — Encounter: Payer: Self-pay | Admitting: Neurology

## 2018-01-05 ENCOUNTER — Encounter: Payer: Self-pay | Admitting: Family Medicine

## 2018-01-05 ENCOUNTER — Ambulatory Visit: Payer: BLUE CROSS/BLUE SHIELD | Admitting: Neurology

## 2018-01-05 VITALS — BP 108/90 | HR 88 | Ht 65.0 in | Wt 233.0 lb

## 2018-01-05 DIAGNOSIS — G43009 Migraine without aura, not intractable, without status migrainosus: Secondary | ICD-10-CM | POA: Diagnosis not present

## 2018-01-05 DIAGNOSIS — G4485 Primary stabbing headache: Secondary | ICD-10-CM | POA: Diagnosis not present

## 2018-01-05 MED ORDER — TOPIRAMATE 200 MG PO TABS: 200.0000 mg | ORAL_TABLET | Freq: Every day | ORAL | 5 refills | Status: DC

## 2018-01-05 NOTE — Telephone Encounter (Signed)
Notified patient to try inhaler and if symptoms don't improved come in to be seen. Patient verbalized understanding.

## 2018-01-05 NOTE — Patient Instructions (Signed)
1.  We will increase topiramate to 200mg  at bedtime 2.  Continue sumatriptan 100mg  as needed for migraines 3.  Limit use of pain relievers to no more than 2 days out of week to prevent risk of rebound or medication-overuse headache. 4.  Keep headache diary 5.  Follow up in 6 months.  For ENT, you previously saw Dr. Melida Quitter on [address], phone # [phone]

## 2018-01-09 ENCOUNTER — Ambulatory Visit: Payer: BLUE CROSS/BLUE SHIELD | Admitting: Family Medicine

## 2018-01-09 ENCOUNTER — Encounter: Payer: Self-pay | Admitting: Family Medicine

## 2018-01-09 ENCOUNTER — Ambulatory Visit: Payer: Self-pay | Admitting: Psychology

## 2018-01-09 VITALS — BP 110/88 | HR 106 | Temp 98.6°F | Ht 65.0 in | Wt 232.8 lb

## 2018-01-09 DIAGNOSIS — E8881 Metabolic syndrome: Secondary | ICD-10-CM

## 2018-01-09 DIAGNOSIS — Z79899 Other long term (current) drug therapy: Secondary | ICD-10-CM | POA: Diagnosis not present

## 2018-01-09 DIAGNOSIS — E88819 Insulin resistance, unspecified: Secondary | ICD-10-CM

## 2018-01-09 DIAGNOSIS — E7800 Pure hypercholesterolemia, unspecified: Secondary | ICD-10-CM | POA: Diagnosis not present

## 2018-01-09 DIAGNOSIS — R635 Abnormal weight gain: Secondary | ICD-10-CM

## 2018-01-09 DIAGNOSIS — R05 Cough: Secondary | ICD-10-CM | POA: Diagnosis not present

## 2018-01-09 DIAGNOSIS — R059 Cough, unspecified: Secondary | ICD-10-CM

## 2018-01-09 DIAGNOSIS — K3184 Gastroparesis: Secondary | ICD-10-CM | POA: Diagnosis not present

## 2018-01-09 DIAGNOSIS — F409 Phobic anxiety disorder, unspecified: Secondary | ICD-10-CM

## 2018-01-09 DIAGNOSIS — F5105 Insomnia due to other mental disorder: Secondary | ICD-10-CM

## 2018-01-09 DIAGNOSIS — R739 Hyperglycemia, unspecified: Secondary | ICD-10-CM

## 2018-01-09 DIAGNOSIS — K219 Gastro-esophageal reflux disease without esophagitis: Secondary | ICD-10-CM

## 2018-01-09 LAB — LIPID PANEL
Cholesterol: 275 mg/dL — ABNORMAL HIGH (ref 0–200)
HDL: 64.9 mg/dL (ref >39.00)
LDL Cholesterol: 196 mg/dL — ABNORMAL HIGH (ref 0–99)
NonHDL: 210.09
Total CHOL/HDL Ratio: 4
Triglycerides: 71 mg/dL (ref 0.0–149.0)
VLDL: 14.2 mg/dL (ref 0.0–40.0)

## 2018-01-09 LAB — CBC WITH DIFFERENTIAL/PLATELET
Basophils Absolute: 0 10*3/uL (ref 0.0–0.1)
Basophils Relative: 0.9 % (ref 0.0–3.0)
Eosinophils Absolute: 0.2 10*3/uL (ref 0.0–0.7)
Eosinophils Relative: 3.1 % (ref 0.0–5.0)
HCT: 37.4 % (ref 36.0–46.0)
Hemoglobin: 12.3 g/dL (ref 12.0–15.0)
Lymphocytes Relative: 37.5 % (ref 12.0–46.0)
Lymphs Abs: 2 10*3/uL (ref 0.7–4.0)
MCHC: 32.8 g/dL (ref 30.0–36.0)
MCV: 97.3 fl (ref 78.0–100.0)
Monocytes Absolute: 0.4 10*3/uL (ref 0.1–1.0)
Monocytes Relative: 7.6 % (ref 3.0–12.0)
Neutro Abs: 2.7 10*3/uL (ref 1.4–7.7)
Neutrophils Relative %: 50.9 % (ref 43.0–77.0)
Platelets: 206 10*3/uL (ref 150.0–400.0)
RBC: 3.85 Mil/uL — ABNORMAL LOW (ref 3.87–5.11)
RDW: 14 % (ref 11.5–15.5)
WBC: 5.3 10*3/uL (ref 4.0–10.5)

## 2018-01-09 LAB — COMPREHENSIVE METABOLIC PANEL
ALT: 18 U/L (ref 0–35)
AST: 18 U/L (ref 0–37)
Albumin: 3.8 g/dL (ref 3.5–5.2)
Alkaline Phosphatase: 62 U/L (ref 39–117)
BUN: 13 mg/dL (ref 6–23)
CO2: 26 mEq/L (ref 19–32)
Calcium: 8.9 mg/dL (ref 8.4–10.5)
Chloride: 109 mEq/L (ref 96–112)
Creatinine, Ser: 0.91 mg/dL (ref 0.40–1.20)
GFR: 68.67 mL/min (ref >60.00)
Glucose, Bld: 130 mg/dL — ABNORMAL HIGH (ref 70–99)
Potassium: 3.7 mEq/L (ref 3.5–5.1)
Sodium: 141 mEq/L (ref 135–145)
Total Bilirubin: 0.4 mg/dL (ref 0.2–1.2)
Total Protein: 6.7 g/dL (ref 6.0–8.3)

## 2018-01-09 LAB — HEMOGLOBIN A1C: Hgb A1c MFr Bld: 6 % (ref 4.6–6.5)

## 2018-01-09 MED ORDER — BUDESONIDE-FORMOTEROL FUMARATE 160-4.5 MCG/ACT IN AERO: 2.0000 | INHALATION_SPRAY | Freq: Two times a day (BID) | RESPIRATORY_TRACT | 3 refills | Status: DC

## 2018-01-09 MED ORDER — SUCRALFATE 1 G PO TABS: 1.0000 g | ORAL_TABLET | Freq: Three times a day (TID) | ORAL | 2 refills | Status: DC

## 2018-01-09 MED ORDER — NALOXONE HCL 0.4 MG/ML IJ SOLN: 0.4000 mg | INTRAMUSCULAR | 0 refills | Status: DC | PRN

## 2018-01-09 MED ORDER — ALPRAZOLAM 1 MG PO TABS: ORAL_TABLET | ORAL | 0 refills | Status: DC

## 2018-01-09 NOTE — Progress Notes (Signed)
[name] White is a 52 y.o. female is here for follow up.  History of Present Illness:   Lonell Grandchild, CMA acting as scribe for Dr. Briscoe Deutscher.   HPI: Patient in office for follow up. She has had ongoing cough. She was seen by Dr. Rogers Blocker on 11/17 as well otolaryngology on 11/15. Symptoms started on 12/25/17. She is currently having no productive cough. She has been taking mucinex for several days. She denies any sore throat, ear pain or runny nose. She has not had fever or chills. Reflux. Spasmodic cough. Partial in mouth.  Needs Xanax refill today. Database reviewed. Should be prescribed Narcan based on risk score. Hydromorphone [facility] by [facility], now at 2 mg TID. Still taking Lyrica 100 mg TID. Will be meeting with [facility] this week.  There are no preventive care reminders to display for this patient.   Depression screen [facility] 2/9 09/02/2017 02/08/2017 01/17/2017  Decreased Interest 3 3 1   Down, Depressed, Hopeless 2 3 1   PHQ - 2 Score 5 6 2   Altered sleeping 3 3 -  Tired, decreased energy 3 3 -  Change in appetite 3 3 -  Feeling bad or failure about yourself  1 3 -  Trouble concentrating 2 2 -  Moving slowly or fidgety/restless 2 0 -  Suicidal thoughts 0 0 -  PHQ-9 Score 19 20 -  Difficult doing work/chores Very difficult Somewhat difficult -  Some recent data might be hidden   PMHx, SurgHx, SocialHx, FamHx, Medications, and Allergies were reviewed in the Visit Navigator and updated as appropriate.   Patient Active Problem List   Diagnosis Date Noted  . Chronic constipation 12/02/2017  . Strain of flexor muscle of hip 11/23/2017  . Primary osteoarthritis of right hip 04/25/2017  . Gastroparesis 02/15/2017  . Arthropathy of lumbar facet joint 09/24/2016  . Osteoarthritis 09/24/2016  . Chronic pansinusitis 08/26/2016  . Weight loss 05/20/2016  . Helicobacter pylori ab+ 11/27/2015  . Personal history of colonic polyps 05/04/2015  . Ilioinguinal neuralgia of right side  03/11/2015  . Ilioinguinal neuralgia 03/11/2015  . 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 Chronic right hip pain 04/16/2014  . Musculoskeletal chest pain 09/25/2013  . Chronic pelvic pain in female 07/06/2012  . Cystocele 07/06/2012  . Rectocele 07/06/2012  . Hypersomnia 01/30/2012  . Obstructive sleep apnea syndrome   . Depressive disorder   . Right lumbosacral radiculopathy 07/10/2010  . Dyslipidemia 05/05/2009  . Chronic headache disorder 09/28/2007   Social History   Tobacco Use  . Smoking status: Former Smoker    Packs/day: 0.50    Years: 30.00    Pack years: 15.00    Types: Cigarettes    Last attempt to quit: 06/21/2015    Years since quitting: 2.5  . Smokeless tobacco: Never Used  Substance Use Topics  . Alcohol use: Yes    Alcohol/week: 0.0 standard drinks    Comment: 2 x a month  . Drug use: No   Current Medications and Allergies:   .  albuterol (PROAIR HFA) 108 (90 Base) MCG/ACT inhaler, Inhale 2 puffs into the lungs every 6 (six) hours as needed for wheezing or shortness of breath., Disp: 1 Inhaler, Rfl: 0 .  ALPRAZolam (XANAX) 1 MG tablet, 1/2 to one tablet at bedtime., Disp: 30 tablet, Rfl: 0 .  azithromycin (ZITHROMAX) 250 MG tablet, 2 pills today and then 1 pill days 2-5., Disp: 6 tablet, Rfl: 0 .  benzonatate (TESSALON) 200 MG capsule, Take 1  capsule (200 mg total) by mouth 3 (three) times daily as needed for cough., Disp: 30 capsule, Rfl: 0 .  buPROPion (WELLBUTRIN XL) 150 MG 24 hr tablet, QDLAM, Disp: 90 tablet, Rfl: 1 .  DULoxetine (CYMBALTA) 30 MG capsule, Take 2 capsules (60 mg total) by mouth at bedtime., Disp: 90 capsule, Rfl: 3 .  guaiFENesin-codeine 100-10 MG/5ML syrup, Take 10 mLs by mouth every 6 (six) hours as needed for cough., Disp: 120 mL, Rfl: 0 .  linaclotide (LINZESS) 290 MCG CAPS capsule, TAKE 1 TABLET BY MOUTH DAILY, Disp: , Rfl:  .  nitroGLYCERIN (NITROSTAT) 0.4 MG SL tablet, nitroglycerin 0.4 mg sublingual tablet  as needed for  chest pain, Disp: , Rfl:  .  ondansetron (ZOFRAN) 4 MG tablet, TAKE ONE TABLET BY MOUTH EVERY 8 HOURS AS NEEDED FOR NAUSEA OR VOMITING, Disp: 30 tablet, Rfl: 4 .  pantoprazole (PROTONIX) 40 MG tablet, Take 1 tablet (40 mg total) by mouth daily., Disp: 90 tablet, Rfl: 3 .  pregabalin (LYRICA) 100 MG capsule, Take 1 capsule (100 mg total) by mouth 3 (three) times daily., Disp: 90 capsule, Rfl: 2 .  rOPINIRole (REQUIP) 1 MG tablet, TAKE 2 TABLETS (2 MG TOTAL) BY MOUTH AT BEDTIME., Disp: 60 tablet, Rfl: 1 .  SUMAtriptan (IMITREX) 100 MG tablet, PLEASE SEE ATTACHED FOR DETAILED DIRECTIONS, Disp: 5 tablet, Rfl: 1 .  topiramate (TOPAMAX) 200 MG tablet, Take 1 tablet (200 mg total) by mouth at bedtime., Disp: 30 tablet, Rfl: 5   Allergies  Allergen Reactions  . Morphine Hives and Nausea And Vomiting    INTOLERANCE >  N & V  . Penicillins Hives, Itching and Other (See Comments)     PATIENT HAS HAD A PCN REACTION WITH IMMEDIATE RASH, FACIAL/TONGUE/THROAT SWELLING, SOB, OR LIGHTHEADEDNESS WITH HYPOTENSION:  #  #  #  YES  #  #  #   Has patient had a PCN reaction causing severe rash involving mucus membranes or skin necrosis: Unknown Has patient had a PCN reaction that required hospitalization: No Has patient had a PCN reaction occurring within the last 10 years: No If all of the above answers are "NO", then may proceed with Cephalosporin use.    Review of Systems   Pertinent items are noted in the HPI. Otherwise, ROS is negative.  Vitals:   Vitals:   01/09/18 1359  BP: 110/88  Pulse: (!) 106  Temp: 98.6 F (37 C)  TempSrc: Oral  SpO2: 97%  Weight: 232 lb 12.8 oz (105.6 kg)  Height: 5\' 5"  (1.651 m)     Body mass index is 38.74 kg/m.  Physical Exam:   Physical Exam  Constitutional: She appears well-nourished.  HENT:  Head: Normocephalic and atraumatic.  Eyes: Pupils are equal, round, and reactive to light. EOM are normal.  Neck: Normal range of motion. Neck supple.  Cardiovascular:  Normal rate, regular rhythm, normal heart sounds and intact distal pulses.  Pulmonary/Chest: Effort normal.  Abdominal: Soft.  Skin: Skin is warm.  Psychiatric: She has a normal mood and affect. Her behavior is normal.  Nursing note and vitals reviewed.  Results for orders placed or performed in visit on 01/09/18  CBC with Differential/Platelet  Result Value Ref Range   WBC 5.3 4.0 - 10.5 K/uL   RBC 3.85 (L) 3.87 - 5.11 Mil/uL   Hemoglobin 12.3 12.0 - 15.0 g/dL   HCT 37.4 36.0 - 46.0 %   MCV 97.3 78.0 - 100.0 fl   MCHC 32.8 30.0 -  36.0 g/dL   RDW 14.0 11.5 - 15.5 %   Platelets 206.0 150.0 - 400.0 K/uL   Neutrophils Relative % 50.9 43.0 - 77.0 %   Lymphocytes Relative 37.5 12.0 - 46.0 %   Monocytes Relative 7.6 3.0 - 12.0 %   Eosinophils Relative 3.1 0.0 - 5.0 %   Basophils Relative 0.9 0.0 - 3.0 %   Neutro Abs 2.7 1.4 - 7.7 K/uL   Lymphs Abs 2.0 0.7 - 4.0 K/uL   Monocytes Absolute 0.4 0.1 - 1.0 K/uL   Eosinophils Absolute 0.2 0.0 - 0.7 K/uL   Basophils Absolute 0.0 0.0 - 0.1 K/uL  Comprehensive metabolic panel  Result Value Ref Range   Sodium 141 135 - 145 mEq/L   Potassium 3.7 3.5 - 5.1 mEq/L   Chloride 109 96 - 112 mEq/L   CO2 26 19 - 32 mEq/L   Glucose, Bld 130 (H) 70 - 99 mg/dL   BUN 13 6 - 23 mg/dL   Creatinine, Ser 0.91 0.40 - 1.20 mg/dL   Total Bilirubin 0.4 0.2 - 1.2 mg/dL   Alkaline Phosphatase 62 39 - 117 U/L   AST 18 0 - 37 U/L   ALT 18 0 - 35 U/L   Total Protein 6.7 6.0 - 8.3 g/dL   Albumin 3.8 3.5 - 5.2 g/dL   Calcium 8.9 8.4 - 10.5 mg/dL   GFR 68.67 >60.00 mL/min  Hemoglobin A1c  Result Value Ref Range   Hgb A1c MFr Bld 6.0 4.6 - 6.5 %  Lipid panel  Result Value Ref Range   Cholesterol 275 (H) 0 - 200 mg/dL   Triglycerides 71.0 0.0 - 149.0 mg/dL   HDL 64.90 >39.00 mg/dL   VLDL 14.2 0.0 - 40.0 mg/dL   LDL Cholesterol 196 (H) 0 - 99 mg/dL   Total CHOL/HDL Ratio 4    NonHDL 210.09    Assessment and Plan:   Cesar was seen today for  follow-up.  Diagnoses and all orders for this visit:  Cough Comments: Most c/w reflex today. Orders: -     sucralfate (CARAFATE) 1 g tablet; Take 1 tablet (1 g total) by mouth 4 (four) times daily -  with meals and at bedtime. -     budesonide-formoterol (SYMBICORT) 160-4.5 MCG/ACT inhaler; Inhale 2 puffs into the lungs 2 (two) times daily.  Hyperglycemia -     Hemoglobin A1c  Medication management -     CBC with Differential/Platelet -     Comprehensive metabolic panel -     Lipid panel  Gastroparesis  Gastroesophageal reflux disease, esophagitis presence not specified -     sucralfate (CARAFATE) 1 g tablet; Take 1 tablet (1 g total) by mouth 4 (four) times daily -  with meals and at bedtime.  Pure hypercholesterolemia -     Lipid panel -     simvastatin (ZOCOR) 20 MG tablet; Take 1 tablet (20 mg total) by mouth at bedtime.  Insulin resistance -     Hemoglobin A1c -     metFORMIN (GLUCOPHAGE-XR) 500 MG 24 hr tablet; Take 1 tablet (500 mg total) by mouth 2 (two) times daily.  Weight gain -     CBC with Differential/Platelet -     Comprehensive metabolic panel -     Hemoglobin A1c -     Lipid panel  Insomnia due to anxiety and fear -     ALPRAZolam (XANAX) 1 MG tablet; 1/2 to one tablet at bedtime.  Other orders -  naloxone (NARCAN) 0.4 MG/ML injection; Inject 1 mL (0.4 mg total) into the vein as needed.   . Reviewed expectations re: course of current medical issues. . Discussed self-management of symptoms. . Outlined signs and symptoms indicating need for more acute intervention. . Patient verbalized understanding and all questions were answered. Marland Kitchen Health Maintenance issues including appropriate healthy diet, exercise, and smoking avoidance were discussed with patient. . See orders for this visit as documented in the electronic medical record. . Patient received an After Visit Summary.  CMA served as Education administrator during this visit. History, Physical, and Plan  performed by medical provider. The above documentation has been reviewed and is accurate and complete. Briscoe Deutscher, D.O.  Briscoe Deutscher, DO [place], [facility] 01/17/2018

## 2018-01-10 ENCOUNTER — Ambulatory Visit (HOSPITAL_COMMUNITY): Payer: BLUE CROSS/BLUE SHIELD | Admitting: Psychiatry

## 2018-01-10 ENCOUNTER — Other Ambulatory Visit: Payer: Self-pay | Admitting: Family Medicine

## 2018-01-10 ENCOUNTER — Encounter: Payer: Self-pay | Admitting: Family Medicine

## 2018-01-11 ENCOUNTER — Other Ambulatory Visit: Payer: Self-pay

## 2018-01-11 MED ORDER — NALOXONE HCL 4 MG/0.1ML NA LIQD: 1.0000 | Freq: Once | NASAL | 0 refills | Status: AC

## 2018-01-14 ENCOUNTER — Encounter: Payer: Self-pay | Admitting: Family Medicine

## 2018-01-16 MED ORDER — SIMVASTATIN 20 MG PO TABS: 20.0000 mg | ORAL_TABLET | Freq: Every day | ORAL | 3 refills | Status: DC

## 2018-01-16 MED ORDER — METFORMIN HCL ER 500 MG PO TB24: 500.0000 mg | ORAL_TABLET | Freq: Two times a day (BID) | ORAL | 3 refills | Status: DC

## 2018-01-17 ENCOUNTER — Encounter: Payer: Self-pay | Admitting: Family Medicine

## 2018-01-18 ENCOUNTER — Encounter: Payer: Self-pay | Admitting: Family Medicine

## 2018-01-23 ENCOUNTER — Other Ambulatory Visit: Payer: Self-pay

## 2018-01-23 NOTE — Telephone Encounter (Signed)
rec'd fax from [place] patient is needing refill on RX Ropinirole 2mg  Per VS ov notes from 12/03/2017- patient needs this refilled by PCP.  LVM for patient regarding this information above.X1

## 2018-01-26 ENCOUNTER — Encounter: Payer: Self-pay | Admitting: Family Medicine

## 2018-01-27 ENCOUNTER — Encounter: Payer: Self-pay | Admitting: Family Medicine

## 2018-01-27 ENCOUNTER — Other Ambulatory Visit: Payer: Self-pay | Admitting: Family Medicine

## 2018-01-27 DIAGNOSIS — G5791 Unspecified mononeuropathy of right lower limb: Secondary | ICD-10-CM

## 2018-01-27 NOTE — Telephone Encounter (Signed)
Ok to refill 

## 2018-01-27 NOTE — Telephone Encounter (Signed)
fYI  

## 2018-01-31 ENCOUNTER — Ambulatory Visit: Payer: BLUE CROSS/BLUE SHIELD | Admitting: Psychology

## 2018-02-01 ENCOUNTER — Other Ambulatory Visit: Payer: Self-pay | Admitting: Family Medicine

## 2018-02-01 DIAGNOSIS — F332 Major depressive disorder, recurrent severe without psychotic features: Secondary | ICD-10-CM

## 2018-02-13 ENCOUNTER — Other Ambulatory Visit: Payer: Self-pay | Admitting: Family Medicine

## 2018-02-13 ENCOUNTER — Encounter: Payer: Self-pay | Admitting: Family Medicine

## 2018-02-13 DIAGNOSIS — F409 Phobic anxiety disorder, unspecified: Secondary | ICD-10-CM

## 2018-02-13 DIAGNOSIS — F5105 Insomnia due to other mental disorder: Secondary | ICD-10-CM

## 2018-02-13 MED ORDER — ALPRAZOLAM 1 MG PO TABS: ORAL_TABLET | ORAL | 0 refills | Status: DC

## 2018-02-13 NOTE — Telephone Encounter (Signed)
Last OV 01/09/18 Last refill 01/09/18 #30/0 Next OV 04/11/2018  Forwarding to Dr. Juleen China.

## 2018-02-20 ENCOUNTER — Encounter: Payer: Self-pay | Admitting: Family Medicine

## 2018-02-20 ENCOUNTER — Ambulatory Visit (INDEPENDENT_AMBULATORY_CARE_PROVIDER_SITE_OTHER): Payer: BLUE CROSS/BLUE SHIELD | Admitting: Psychology

## 2018-02-20 DIAGNOSIS — F331 Major depressive disorder, recurrent, moderate: Secondary | ICD-10-CM | POA: Diagnosis not present

## 2018-03-03 ENCOUNTER — Other Ambulatory Visit: Payer: Self-pay | Admitting: Family Medicine

## 2018-03-03 DIAGNOSIS — R059 Cough, unspecified: Secondary | ICD-10-CM

## 2018-03-03 DIAGNOSIS — K219 Gastro-esophageal reflux disease without esophagitis: Secondary | ICD-10-CM

## 2018-03-03 DIAGNOSIS — R05 Cough: Secondary | ICD-10-CM

## 2018-03-10 ENCOUNTER — Ambulatory Visit (INDEPENDENT_AMBULATORY_CARE_PROVIDER_SITE_OTHER): Payer: PRIVATE HEALTH INSURANCE | Admitting: Psychiatry

## 2018-03-10 ENCOUNTER — Encounter (HOSPITAL_COMMUNITY): Payer: Self-pay | Admitting: Psychiatry

## 2018-03-10 VITALS — BP 95/65 | HR 72 | Ht 65.0 in | Wt 227.0 lb

## 2018-03-10 DIAGNOSIS — F411 Generalized anxiety disorder: Secondary | ICD-10-CM | POA: Diagnosis not present

## 2018-03-10 DIAGNOSIS — F319 Bipolar disorder, unspecified: Secondary | ICD-10-CM

## 2018-03-10 DIAGNOSIS — F4310 Post-traumatic stress disorder, unspecified: Secondary | ICD-10-CM

## 2018-03-10 MED ORDER — DULOXETINE HCL 60 MG PO CPEP: 60.0000 mg | ORAL_CAPSULE | Freq: Every day | ORAL | 1 refills | Status: DC

## 2018-03-10 MED ORDER — HYDROXYZINE PAMOATE 25 MG PO CAPS: ORAL_CAPSULE | ORAL | 1 refills | Status: DC

## 2018-03-10 MED ORDER — LAMOTRIGINE 25 MG PO TABS: ORAL_TABLET | ORAL | 1 refills | Status: DC
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
## 2018-03-21 ENCOUNTER — Other Ambulatory Visit: Payer: Self-pay | Admitting: Psychiatry

## 2018-03-21 ENCOUNTER — Other Ambulatory Visit: Payer: Self-pay | Admitting: Pulmonary Disease

## 2018-03-31 ENCOUNTER — Other Ambulatory Visit: Payer: Self-pay | Admitting: *Deleted

## 2018-03-31 MED ORDER — ROPINIROLE HCL 1 MG PO TABS: 2.0000 mg | ORAL_TABLET | Freq: Every day | ORAL | 1 refills | Status: DC

## 2018-04-03 ENCOUNTER — Other Ambulatory Visit: Payer: Self-pay | Admitting: Family Medicine

## 2018-04-03 DIAGNOSIS — R059 Cough, unspecified: Secondary | ICD-10-CM

## 2018-04-03 DIAGNOSIS — R05 Cough: Secondary | ICD-10-CM

## 2018-04-11 ENCOUNTER — Ambulatory Visit (INDEPENDENT_AMBULATORY_CARE_PROVIDER_SITE_OTHER): Payer: PRIVATE HEALTH INSURANCE | Admitting: Family Medicine

## 2018-04-11 ENCOUNTER — Encounter: Payer: Self-pay | Admitting: Family Medicine

## 2018-04-11 ENCOUNTER — Other Ambulatory Visit: Payer: Self-pay | Admitting: Family Medicine

## 2018-04-11 VITALS — BP 98/64 | HR 85 | Temp 97.9°F | Ht 65.0 in | Wt 227.6 lb

## 2018-04-11 DIAGNOSIS — F32A Depression, unspecified: Secondary | ICD-10-CM

## 2018-04-11 DIAGNOSIS — Z79899 Other long term (current) drug therapy: Secondary | ICD-10-CM

## 2018-04-11 DIAGNOSIS — E8881 Metabolic syndrome: Secondary | ICD-10-CM

## 2018-04-11 DIAGNOSIS — R51 Headache: Secondary | ICD-10-CM

## 2018-04-11 DIAGNOSIS — R519 Headache, unspecified: Secondary | ICD-10-CM

## 2018-04-11 DIAGNOSIS — E785 Hyperlipidemia, unspecified: Secondary | ICD-10-CM

## 2018-04-11 DIAGNOSIS — G8929 Other chronic pain: Secondary | ICD-10-CM

## 2018-04-11 DIAGNOSIS — M25551 Pain in right hip: Secondary | ICD-10-CM | POA: Diagnosis not present

## 2018-04-11 DIAGNOSIS — F329 Major depressive disorder, single episode, unspecified: Secondary | ICD-10-CM

## 2018-04-11 NOTE — Progress Notes (Signed)
[name] White is a 53 y.o. female is here for follow up.  History of Present Illness:   HPI:   There are no preventive care reminders to display for this patient. Depression screen [facility] 2/9 04/11/2018 09/02/2017 02/08/2017  Decreased Interest 3 3 3   Down, Depressed, Hopeless 3 2 3   PHQ - 2 Score 6 5 6   Altered sleeping 3 3 3   Tired, decreased energy 3 3 3   Change in appetite 3 3 3   Feeling bad or failure about yourself  1 1 3   Trouble concentrating 2 2 2   Moving slowly or fidgety/restless 2 2 0  Suicidal thoughts 1 0 0  PHQ-9 Score 21 19 20   Difficult doing work/chores Very difficult Very difficult Somewhat difficult  Some recent data might be hidden   PMHx, SurgHx, SocialHx, FamHx, Medications, and Allergies were reviewed in the Visit Navigator and updated as appropriate.   Patient Active Problem List   Diagnosis Date Noted  . Insulin resistance 04/23/2018  . Morbid obesity ([place]), BMI > 35 with comorbid conditions 04/23/2018  . Chronic bilateral low back pain 12/07/2017  . Chronic constipation 12/02/2017  . Strain of flexor muscle of hip 11/23/2017  . Primary osteoarthritis of right hip 04/25/2017  . Gastroparesis 02/15/2017  . Arthropathy of lumbar facet joint 09/24/2016  . Osteoarthritis 09/24/2016  . Chronic pansinusitis 08/26/2016  . Personal history of colonic polyps 05/04/2015  . 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 Ilioinguinal neuralgia 03/11/2015  . 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 Chronic right hip pain 04/16/2014  . Musculoskeletal chest pain 09/25/2013  . Chronic pelvic pain in female 07/06/2012  . Cystocele 07/06/2012  . Rectocele 07/06/2012  . Hypersomnia 01/30/2012  . Obstructive sleep apnea syndrome   . Depressive disorder   . Right lumbosacral radiculopathy 07/10/2010  . Dyslipidemia 05/05/2009  . Chronic headache disorder 09/28/2007   Social History   Tobacco Use  . Smoking status: Former Smoker    Packs/day: 0.50     Years: 30.00    Pack years: 15.00    Types: Cigarettes    Last attempt to quit: 06/21/2015    Years since quitting: 2.8  . Smokeless tobacco: Never Used  Substance Use Topics  . Alcohol use: Yes    Alcohol/week: 0.0 standard drinks    Comment: 2 x a month  . Drug use: No   Current Medications and Allergies   Current Outpatient Medications:  .  albuterol (PROAIR HFA) 108 (90 Base) MCG/ACT inhaler, Inhale 2 puffs into the lungs every 6 (six) hours as needed for wheezing or shortness of breath. (Patient not taking: Reported on 03/10/2018), Disp: 1 Inhaler, Rfl: 0 .  azithromycin (ZITHROMAX) 250 MG tablet, 2 pills today and then 1 pill days 2-5., Disp: 6 tablet, Rfl: 0 .  DULoxetine (CYMBALTA) 60 MG capsule, Take 1 capsule (60 mg total) by mouth daily., Disp: 30 capsule, Rfl: 1 .  gabapentin (NEURONTIN) 300 MG capsule, Take 300 mg by mouth 3 (three) times daily., Disp: , Rfl:  .  hydrOXYzine (VISTARIL) 25 MG capsule, Take 1-2 capsule at bed time, Disp: 60 capsule, Rfl: 1 .  lamoTRIgine (LAMICTAL) 25 MG tablet, Take one tab daily for 1 week and than 2 tab daily, Disp: 60 tablet, Rfl: 1 .  linaclotide (LINZESS) 290 MCG CAPS capsule, TAKE 1 TABLET BY MOUTH DAILY, Disp: , Rfl:  .  metFORMIN (GLUCOPHAGE-XR) 500 MG 24 hr tablet, Take 1 tablet (500 mg total) by mouth 2 (  two) times daily., Disp: 60 tablet, Rfl: 3 .  nitroGLYCERIN (NITROSTAT) 0.4 MG SL tablet, nitroglycerin 0.4 mg sublingual tablet  as needed for chest pain, Disp: , Rfl:  .  ondansetron (ZOFRAN) 4 MG tablet, TAKE ONE TABLET BY MOUTH EVERY 8 HOURS AS NEEDED FOR NAUSEA OR VOMITING, Disp: 30 tablet, Rfl: 4 .  pantoprazole (PROTONIX) 40 MG tablet, Take 1 tablet (40 mg total) by mouth daily., Disp: 90 tablet, Rfl: 3 .  rOPINIRole (REQUIP) 1 MG tablet, Take 2 tablets (2 mg total) by mouth at bedtime., Disp: 60 tablet, Rfl: 1 .  simvastatin (ZOCOR) 20 MG tablet, Take 1 tablet (20 mg total) by mouth at bedtime., Disp: 90 tablet, Rfl: 3 .   sucralfate (CARAFATE) 1 g tablet, TAKE 1 TABLET (1 G TOTAL) BY MOUTH 4 (FOUR) TIMES DAILY - WITH MEALS AND AT BEDTIME., Disp: 120 tablet, Rfl: 2 .  SUMAtriptan (IMITREX) 100 MG tablet, PLEASE SEE ATTACHED FOR DETAILED DIRECTIONS, Disp: 5 tablet, Rfl: 1 .  SYMBICORT 160-4.5 MCG/ACT inhaler, TAKE 2 PUFFS BY MOUTH TWICE A DAY, Disp: 30.6 Inhaler, Rfl: 1 .  topiramate (TOPAMAX) 200 MG tablet, Take 1 tablet (200 mg total) by mouth at bedtime., Disp: 30 tablet, Rfl: 5   Allergies  Allergen Reactions  . Morphine Hives and Nausea And Vomiting    INTOLERANCE >  N & V  . Penicillins Hives, Itching and Other (See Comments)     PATIENT HAS HAD A PCN REACTION WITH IMMEDIATE RASH, FACIAL/TONGUE/THROAT SWELLING, SOB, OR LIGHTHEADEDNESS WITH HYPOTENSION:  #  #  #  YES  #  #  #   Has patient had a PCN reaction causing severe rash involving mucus membranes or skin necrosis: Unknown Has patient had a PCN reaction that required hospitalization: No Has patient had a PCN reaction occurring within the last 10 years: No If all of the above answers are "NO", then may proceed with Cephalosporin use.    Review of Systems   Pertinent items are noted in the HPI. Otherwise, a complete ROS is negative.  Vitals   Vitals:   04/11/18 1510  BP: 98/64  Pulse: 85  Temp: 97.9 F (36.6 C)  TempSrc: Oral  SpO2: 97%  Weight: 227 lb 9.6 oz (103.2 kg)  Height: 5\' 5"  (1.651 m)     Body mass index is 37.87 kg/m.  Physical Exam   Physical Exam Vitals signs and nursing note reviewed.  HENT:     Head: Normocephalic and atraumatic.  Eyes:     Pupils: Pupils are equal, round, and reactive to light.  Neck:     Musculoskeletal: Normal range of motion and neck supple.  Cardiovascular:     Rate and Rhythm: Normal rate and regular rhythm.     Heart sounds: Normal heart sounds.  Pulmonary:     Effort: Pulmonary effort is normal.  Abdominal:     Palpations: Abdomen is soft.  Skin:    General: Skin is warm.    Psychiatric:        Behavior: Behavior normal.     Results for orders placed or performed in visit on 01/09/18  CBC with Differential/Platelet  Result Value Ref Range   WBC 5.3 4.0 - 10.5 K/uL   RBC 3.85 (L) 3.87 - 5.11 Mil/uL   Hemoglobin 12.3 12.0 - 15.0 g/dL   HCT 37.4 36.0 - 46.0 %   MCV 97.3 78.0 - 100.0 fl   MCHC 32.8 30.0 - 36.0 g/dL   RDW 14.0  11.5 - 15.5 %   Platelets 206.0 150.0 - 400.0 K/uL   Neutrophils Relative % 50.9 43.0 - 77.0 %   Lymphocytes Relative 37.5 12.0 - 46.0 %   Monocytes Relative 7.6 3.0 - 12.0 %   Eosinophils Relative 3.1 0.0 - 5.0 %   Basophils Relative 0.9 0.0 - 3.0 %   Neutro Abs 2.7 1.4 - 7.7 K/uL   Lymphs Abs 2.0 0.7 - 4.0 K/uL   Monocytes Absolute 0.4 0.1 - 1.0 K/uL   Eosinophils Absolute 0.2 0.0 - 0.7 K/uL   Basophils Absolute 0.0 0.0 - 0.1 K/uL  Comprehensive metabolic panel  Result Value Ref Range   Sodium 141 135 - 145 mEq/L   Potassium 3.7 3.5 - 5.1 mEq/L   Chloride 109 96 - 112 mEq/L   CO2 26 19 - 32 mEq/L   Glucose, Bld 130 (H) 70 - 99 mg/dL   BUN 13 6 - 23 mg/dL   Creatinine, Ser 0.91 0.40 - 1.20 mg/dL   Total Bilirubin 0.4 0.2 - 1.2 mg/dL   Alkaline Phosphatase 62 39 - 117 U/L   AST 18 0 - 37 U/L   ALT 18 0 - 35 U/L   Total Protein 6.7 6.0 - 8.3 g/dL   Albumin 3.8 3.5 - 5.2 g/dL   Calcium 8.9 8.4 - 10.5 mg/dL   GFR 68.67 >60.00 mL/min  Hemoglobin A1c  Result Value Ref Range   Hgb A1c MFr Bld 6.0 4.6 - 6.5 %  Lipid panel  Result Value Ref Range   Cholesterol 275 (H) 0 - 200 mg/dL   Triglycerides 71.0 0.0 - 149.0 mg/dL   HDL 64.90 >39.00 mg/dL   VLDL 14.2 0.0 - 40.0 mg/dL   LDL Cholesterol 196 (H) 0 - 99 mg/dL   Total CHOL/HDL Ratio 4    NonHDL 210.09     Assessment and Plan   Gift was seen today for 3 mo follow up.  Diagnoses and all orders for this visit:  Insulin resistance -     Hemoglobin A1c; Future  Medication management -     CBC with Differential/Platelet; Future  Chronic right hip  pain  Dyslipidemia -     Comprehensive metabolic panel; Future -     Lipid panel; Future  Chronic nonintractable headache, unspecified headache type  Depressive disorder  Morbid obesity ([place]), BMI > 35 with comorbid conditions    . Orders and follow up as documented in [place], reviewed diet, exercise and weight control, cardiovascular risk and specific lipid/LDL goals reviewed, reviewed medications and side effects in detail.  . Reviewed expectations re: course of current medical issues. . Outlined signs and symptoms indicating need for more acute intervention. . Patient verbalized understanding and all questions were answered. . Patient received an After Visit Summary.  Briscoe Deutscher, DO [place], [facility] 04/23/2018

## 2018-04-13 ENCOUNTER — Other Ambulatory Visit: Payer: Self-pay | Admitting: Neurology

## 2018-04-13 ENCOUNTER — Telehealth: Payer: Self-pay | Admitting: Radiology

## 2018-04-13 ENCOUNTER — Other Ambulatory Visit (INDEPENDENT_AMBULATORY_CARE_PROVIDER_SITE_OTHER): Payer: PRIVATE HEALTH INSURANCE

## 2018-04-13 DIAGNOSIS — Z79899 Other long term (current) drug therapy: Secondary | ICD-10-CM | POA: Diagnosis not present

## 2018-04-13 DIAGNOSIS — E8881 Metabolic syndrome: Secondary | ICD-10-CM

## 2018-04-13 LAB — CBC WITH DIFFERENTIAL/PLATELET
Basophils Absolute: 0 10*3/uL (ref 0.0–0.1)
Basophils Relative: 0.9 % (ref 0.0–3.0)
Eosinophils Absolute: 0.2 10*3/uL (ref 0.0–0.7)
Eosinophils Relative: 5.2 % — ABNORMAL HIGH (ref 0.0–5.0)
HCT: 40.3 % (ref 36.0–46.0)
Hemoglobin: 13.3 g/dL (ref 12.0–15.0)
Lymphocytes Relative: 45.6 % (ref 12.0–46.0)
Lymphs Abs: 2.2 10*3/uL (ref 0.7–4.0)
MCHC: 32.9 g/dL (ref 30.0–36.0)
MCV: 95.5 fl (ref 78.0–100.0)
Monocytes Absolute: 0.4 10*3/uL (ref 0.1–1.0)
Monocytes Relative: 9.3 % (ref 3.0–12.0)
Neutro Abs: 1.8 10*3/uL (ref 1.4–7.7)
Neutrophils Relative %: 39 % — ABNORMAL LOW (ref 43.0–77.0)
Platelets: 199 10*3/uL (ref 150.0–400.0)
RBC: 4.22 Mil/uL (ref 3.87–5.11)
RDW: 13.4 % (ref 11.5–15.5)
WBC: 4.7 10*3/uL (ref 4.0–10.5)

## 2018-04-13 LAB — COMPREHENSIVE METABOLIC PANEL
ALT: 15 U/L (ref 0–35)
AST: 18 U/L (ref 0–37)
Albumin: 4 g/dL (ref 3.5–5.2)
Alkaline Phosphatase: 59 U/L (ref 39–117)
BUN: 16 mg/dL (ref 6–23)
CO2: 27 mEq/L (ref 19–32)
Calcium: 9.1 mg/dL (ref 8.4–10.5)
Chloride: 109 mEq/L (ref 96–112)
Creatinine, Ser: 1.01 mg/dL (ref 0.40–1.20)
GFR: 57.23 mL/min — ABNORMAL LOW (ref >60.00)
Glucose, Bld: 87 mg/dL (ref 70–99)
Potassium: 4.3 mEq/L (ref 3.5–5.1)
Sodium: 142 mEq/L (ref 135–145)
Total Bilirubin: 0.3 mg/dL (ref 0.2–1.2)
Total Protein: 6.5 g/dL (ref 6.0–8.3)

## 2018-04-13 LAB — LIPID PANEL
Cholesterol: 190 mg/dL (ref 0–200)
HDL: 67.3 mg/dL (ref >39.00)
LDL Cholesterol: 104 mg/dL — ABNORMAL HIGH (ref 0–99)
NonHDL: 122.46
Total CHOL/HDL Ratio: 3
Triglycerides: 90 mg/dL (ref 0.0–149.0)
VLDL: 18 mg/dL (ref 0.0–40.0)

## 2018-04-13 LAB — HEMOGLOBIN A1C: Hgb A1c MFr Bld: 5.7 % (ref 4.6–6.5)

## 2018-04-13 NOTE — Telephone Encounter (Signed)
Please place lab orders for Pts lab appt at 0900 hrs today. Thanks!

## 2018-04-13 NOTE — Telephone Encounter (Signed)
Got it sorry

## 2018-04-18 ENCOUNTER — Encounter: Payer: Self-pay | Admitting: Family Medicine

## 2018-04-20 ENCOUNTER — Ambulatory Visit (INDEPENDENT_AMBULATORY_CARE_PROVIDER_SITE_OTHER): Payer: PRIVATE HEALTH INSURANCE | Admitting: Psychiatry

## 2018-04-20 ENCOUNTER — Encounter (HOSPITAL_COMMUNITY): Payer: Self-pay | Admitting: Psychiatry

## 2018-04-20 VITALS — BP 130/78 | Ht 66.0 in | Wt 226.0 lb

## 2018-04-20 DIAGNOSIS — F319 Bipolar disorder, unspecified: Secondary | ICD-10-CM

## 2018-04-20 DIAGNOSIS — F410 Panic disorder [episodic paroxysmal anxiety] without agoraphobia: Secondary | ICD-10-CM | POA: Diagnosis not present

## 2018-04-20 DIAGNOSIS — F4310 Post-traumatic stress disorder, unspecified: Secondary | ICD-10-CM

## 2018-04-20 MED ORDER — LAMOTRIGINE 100 MG PO TABS: 100.0000 mg | ORAL_TABLET | Freq: Every day | ORAL | 0 refills | Status: DC

## 2018-04-20 MED ORDER — DULOXETINE HCL 60 MG PO CPEP: 60.0000 mg | ORAL_CAPSULE | Freq: Every day | ORAL | 1 refills | Status: DC

## 2018-04-20 MED ORDER — CLONAZEPAM 0.5 MG PO TABS: 0.5000 mg | ORAL_TABLET | Freq: Every day | ORAL | 0 refills | Status: DC | PRN

## 2018-04-20 MED ORDER — OLANZAPINE 5 MG PO TABS: 5.0000 mg | ORAL_TABLET | Freq: Every day | ORAL | 2 refills | Status: DC
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
## 2018-04-23 ENCOUNTER — Encounter: Payer: Self-pay | Admitting: Family Medicine

## 2018-04-23 DIAGNOSIS — E8881 Metabolic syndrome: Secondary | ICD-10-CM | POA: Insufficient documentation

## 2018-04-23 DIAGNOSIS — E88819 Insulin resistance, unspecified: Secondary | ICD-10-CM | POA: Insufficient documentation

## 2018-04-23 DIAGNOSIS — R635 Abnormal weight gain: Secondary | ICD-10-CM | POA: Insufficient documentation

## 2018-04-23 NOTE — Assessment & Plan Note (Addendum)
History: Current symptoms: sugar cravings Prior visit with dietician: no Current exercise: none  Lab Results  Component Value Date   HGBA1C 5.7 04/13/2018   HGBA1C 6.0 01/09/2018   HGBA1C 5.3 08/03/2017   GLUCOSE 87 04/13/2018   GLUCOSE 130 (H) 01/09/2018   GLUCOSE 84 05/05/2017   Assessment/Plan: 1. [name] White will continue to work on weight loss, exercise, and decreasing simple carbohydrates in her diet to help decrease the risk of diabetes.  2. Medication: Tolerating Metformin and will continue.

## 2018-04-23 NOTE — Assessment & Plan Note (Signed)
Ongoing, associated with other chronic pain. Has been followed by Orthopedics, Chiropractor, [place]. On Gabapentin and Cymbalta. Has had PT.

## 2018-04-23 NOTE — Assessment & Plan Note (Signed)
Wt Readings from Last 3 Encounters:  04/11/18 227 lb 9.6 oz (103.2 kg)  01/09/18 232 lb 12.8 oz (105.6 kg)  01/05/18 233 lb (105.7 kg)   The patient is asked to make an attempt to improve diet and exercise patterns to aid in medical management of this problem.

## 2018-04-23 NOTE — Assessment & Plan Note (Signed)
Followed by Psychiatry. Felt that her first visit with Psychiatrist was good. This is high praise and she does not take to people easily. Stable. Will monitor along with BH.

## 2018-04-23 NOTE — Assessment & Plan Note (Signed)
History: Is the patient taking medications without problems? Yes. Does the patient complain of muscle aches?  No. Trying to exercise on a regular basis? No. Compliant with diet? No.  Lab Results  Component Value Date   CHOL 190 04/13/2018   HDL 67.30 04/13/2018   LDLCALC 104 (H) 04/13/2018   LDLDIRECT 160.5 08/30/2012   TRIG 90.0 04/13/2018   CHOLHDL 3 04/13/2018   Lab Results  Component Value Date   ALT 15 04/13/2018   AST 18 04/13/2018   ALKPHOS 59 04/13/2018   BILITOT 0.3 04/13/2018     Assessment/Plan: Dyslipidemia under good control. Continue dietary measures. Continue regular exercise, an average 40 minutes of moderate to vigorous-intensity aerobic activity 3 or 4 times per week. Lipid-lowering medications: Zocor.

## 2018-04-23 NOTE — Assessment & Plan Note (Signed)
Mixed. Followed by Neurology. Stable. Will continue current treatment.

## 2018-04-27 ENCOUNTER — Other Ambulatory Visit: Payer: Self-pay | Admitting: Neurology

## 2018-04-28 ENCOUNTER — Other Ambulatory Visit: Payer: Self-pay | Admitting: Neurology

## 2018-04-30 ENCOUNTER — Other Ambulatory Visit (HOSPITAL_COMMUNITY): Payer: Self-pay | Admitting: Psychiatry

## 2018-04-30 DIAGNOSIS — F319 Bipolar disorder, unspecified: Secondary | ICD-10-CM

## 2018-05-05 ENCOUNTER — Other Ambulatory Visit: Payer: Self-pay | Admitting: Pulmonary Disease

## 2018-05-09 ENCOUNTER — Encounter: Payer: Self-pay | Admitting: Family Medicine

## 2018-05-09 ENCOUNTER — Telehealth (HOSPITAL_COMMUNITY): Payer: Self-pay

## 2018-05-09 NOTE — Telephone Encounter (Signed)
Patient is calling because she is not sleeping, she would like to know what you recommend, please review and advise, thank you

## 2018-05-10 NOTE — Telephone Encounter (Signed)
She can try Zyprexa 10 mg at bedtime.  If symptoms continue to persist then she should consider IOP which we recommended on her last visit.

## 2018-05-12 ENCOUNTER — Encounter: Payer: Self-pay | Admitting: Family Medicine

## 2018-05-12 ENCOUNTER — Other Ambulatory Visit (HOSPITAL_COMMUNITY): Payer: Self-pay | Admitting: Psychiatry

## 2018-05-12 DIAGNOSIS — F319 Bipolar disorder, unspecified: Secondary | ICD-10-CM

## 2018-05-15 ENCOUNTER — Other Ambulatory Visit (HOSPITAL_COMMUNITY): Payer: Self-pay | Admitting: Psychiatry

## 2018-05-15 ENCOUNTER — Ambulatory Visit: Payer: PRIVATE HEALTH INSURANCE | Admitting: Family Medicine

## 2018-05-15 DIAGNOSIS — F410 Panic disorder [episodic paroxysmal anxiety] without agoraphobia: Secondary | ICD-10-CM

## 2018-05-17 ENCOUNTER — Ambulatory Visit (INDEPENDENT_AMBULATORY_CARE_PROVIDER_SITE_OTHER): Payer: PRIVATE HEALTH INSURANCE | Admitting: Family Medicine

## 2018-05-17 ENCOUNTER — Encounter: Payer: Self-pay | Admitting: Family Medicine

## 2018-05-17 ENCOUNTER — Other Ambulatory Visit: Payer: Self-pay

## 2018-05-17 DIAGNOSIS — M25511 Pain in right shoulder: Secondary | ICD-10-CM

## 2018-05-17 DIAGNOSIS — G8911 Acute pain due to trauma: Secondary | ICD-10-CM

## 2018-05-17 DIAGNOSIS — K121 Other forms of stomatitis: Secondary | ICD-10-CM

## 2018-05-17 MED ORDER — TRIAMCINOLONE ACETONIDE 0.1 % MT PSTE: 1.0000 "application " | PASTE | Freq: Two times a day (BID) | OROMUCOSAL | 12 refills | Status: DC

## 2018-05-17 MED ORDER — OLANZAPINE 10 MG PO TABS: 10.0000 mg | ORAL_TABLET | Freq: Every day | ORAL | 0 refills | Status: DC

## 2018-05-17 MED ORDER — FIRST-DUKES MOUTHWASH MT SUSP: 5.0000 mL | Freq: Three times a day (TID) | OROMUCOSAL | 0 refills | Status: DC | PRN

## 2018-05-17 NOTE — Telephone Encounter (Signed)
I called the patient and let her know, she said that she looked into IOP, however insurance only covers 75% and she can not afford to do it at this time

## 2018-05-17 NOTE — Progress Notes (Signed)
Virtual Visit via Video   I connected with [name] White on 05/18/18 at  1:20 PM EDT by a video enabled telemedicine application and verified that I am speaking with the correct person using two identifiers. Location patient: Home Location provider: [facility], Office Persons participating in the virtual visit: Earnie D Yaffe, Briscoe Deutscher, DO, Lonell Grandchild as CMA scribe.  I discussed the limitations of evaluation and management by telemedicine and the availability of in person appointments. The patient expressed understanding and agreed to proceed.  Subjective:  Lonell Grandchild, CMA acting as scribe for Dr. Briscoe Deutscher.   HPI:  Patient had fall few weeks ago of porch where she hit her hips and shoulder. She did not go to orthopedics for evaluation because he is not on insurance. She dd not hit head or loose consciousness.    Sore throat: She has been having burning and sore throat with white spots. We have called before and asked to call psychiatry to see if it could be medications. She did not do that. Spots are in the very back, side, and on bottom lips and under and on tip of tongue.   She has concerns about talking with psychiatrist due to fear of being sent to hospital. They had suggested that she do the out patient therapy due to cost. She is now not working due to [employer].   ROS: See pertinent positives and negatives per HPI.  Patient Active Problem List   Diagnosis Date Noted  . Insulin resistance 04/23/2018  . Morbid obesity ([place]), BMI > 35 with comorbid conditions 04/23/2018  . Chronic bilateral low back pain 12/07/2017  . Chronic constipation 12/02/2017  . Strain of flexor muscle of hip 11/23/2017  . Primary osteoarthritis of right hip 04/25/2017  . Gastroparesis 02/15/2017  . Arthropathy of lumbar facet joint 09/24/2016  . Osteoarthritis 09/24/2016  . Chronic pansinusitis 08/26/2016  . Personal history of colonic polyps 05/04/2015  . 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 Ilioinguinal neuralgia 03/11/2015  . 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 Chronic right hip pain 04/16/2014  . Musculoskeletal chest pain 09/25/2013  . Chronic pelvic pain in female 07/06/2012  . Cystocele 07/06/2012  . Rectocele 07/06/2012  . Hypersomnia 01/30/2012  . Obstructive sleep apnea syndrome   . Depressive disorder   . Right lumbosacral radiculopathy 07/10/2010  . Dyslipidemia 05/05/2009  . Chronic headache disorder 09/28/2007    Social History   Tobacco Use  . Smoking status: Former Smoker    Packs/day: 0.50    Years: 30.00    Pack years: 15.00    Types: Cigarettes    Last attempt to quit: 06/21/2015    Years since quitting: 2.9  . Smokeless tobacco: Never Used  Substance Use Topics  . Alcohol use: Yes    Alcohol/week: 0.0 standard drinks    Comment: 2 x a month   Current Outpatient Medications:  .  albuterol (PROAIR HFA) 108 (90 Base) MCG/ACT inhaler, Inhale 2 puffs into the lungs every 6 (six) hours as needed for wheezing or shortness of breath., Disp: 1 Inhaler, Rfl: 0 .  clonazePAM (KLONOPIN) 0.5 MG tablet, Take 1 tablet (0.5 mg total) by mouth daily as needed for anxiety., Disp: 30 tablet, Rfl: 0 .  DULoxetine (CYMBALTA) 60 MG capsule, Take 1 capsule (60 mg total) by mouth daily., Disp: 30 capsule, Rfl: 1 .  gabapentin (NEURONTIN) 300 MG capsule, Take 300 mg by mouth 3 (three) times daily., Disp: , Rfl:  .  lamoTRIgine (LAMICTAL) 100 MG tablet, TAKE 1 TABLET BY MOUTH EVERY DAY FOR 1 WEEK THEN TAKE 2 TABLETS DAILY, Disp: 90 tablet, Rfl: 0 .  linaclotide (LINZESS) 290 MCG CAPS capsule, TAKE 1 TABLET BY MOUTH DAILY, Disp: , Rfl:  .  metFORMIN (GLUCOPHAGE-XR) 500 MG 24 hr tablet, TAKE 1 TABLET BY MOUTH TWICE A DAY, Disp: 180 tablet, Rfl: 1 .  nitroGLYCERIN (NITROSTAT) 0.4 MG SL tablet, nitroglycerin 0.4 mg sublingual tablet  as needed for chest pain, Disp: , Rfl:  .  OLANZapine (ZYPREXA) 10 MG tablet, Take 1 tablet (10 mg total)  by mouth at bedtime., Disp: 30 tablet, Rfl: 0 .  OLANZapine (ZYPREXA) 5 MG tablet, Take 1 tablet (5 mg total) by mouth at bedtime., Disp: 30 tablet, Rfl: 2 .  ondansetron (ZOFRAN) 4 MG tablet, TAKE ONE TABLET BY MOUTH EVERY 8 HOURS AS NEEDED FOR NAUSEA OR VOMITING, Disp: 30 tablet, Rfl: 4 .  pantoprazole (PROTONIX) 40 MG tablet, Take 1 tablet (40 mg total) by mouth daily., Disp: 90 tablet, Rfl: 3 .  rOPINIRole (REQUIP) 1 MG tablet, Take 2 tablets (2 mg total) by mouth at bedtime., Disp: 60 tablet, Rfl: 1 .  simvastatin (ZOCOR) 20 MG tablet, Take 1 tablet (20 mg total) by mouth at bedtime., Disp: 90 tablet, Rfl: 3 .  sucralfate (CARAFATE) 1 g tablet, TAKE 1 TABLET (1 G TOTAL) BY MOUTH 4 (FOUR) TIMES DAILY - WITH MEALS AND AT BEDTIME. (Patient not taking: Reported on 04/11/2018), Disp: 120 tablet, Rfl: 2 .  SUMAtriptan (IMITREX) 100 MG tablet, PLEASE SEE ATTACHED FOR DETAILED DIRECTIONS, Disp: 5 tablet, Rfl: 1 .  SYMBICORT 160-4.5 MCG/ACT inhaler, TAKE 2 PUFFS BY MOUTH TWICE A DAY, Disp: 30.6 Inhaler, Rfl: 1 .  topiramate (TOPAMAX) 200 MG tablet, TAKE 1 TABLET (200 MG TOTAL) BY MOUTH AT BEDTIME., Disp: 90 tablet, Rfl: 1  Allergies  Allergen Reactions  . Morphine Hives and Nausea And Vomiting    INTOLERANCE >  N & V  . Penicillins Hives, Itching and Other (See Comments)     PATIENT HAS HAD A PCN REACTION WITH IMMEDIATE RASH, FACIAL/TONGUE/THROAT SWELLING, SOB, OR LIGHTHEADEDNESS WITH HYPOTENSION:  #  #  #  YES  #  #  #   Has patient had a PCN reaction causing severe rash involving mucus membranes or skin necrosis: Unknown Has patient had a PCN reaction that required hospitalization: No Has patient had a PCN reaction occurring within the last 10 years: No If all of the above answers are "NO", then may proceed with Cephalosporin use.     Objective:   VITALS: Per patient if applicable, see vitals. GENERAL: Alert, appears well and in no acute distress. CARDIOPULMONARY: Speaking in clear  sentences.   PSYCH: Pleasant and cooperative. Speech normal rate and rhythm. Affect is appropriate. Attention is focused, linear, and appropriate.  NEURO: Oriented as arrived to appointment on time with no prompting.   Assessment and Plan:   Hasel was seen today for follow-up.  Diagnoses and all orders for this visit:  Oral ulcer Comments: DDx: viral, medication-induced. See orders. She will see Psychiatry soon. If worsening or not improving, she will call.  Orders: -     Diphenhyd-Hydrocort-Nystatin (FIRST-DUKES MOUTHWASH) SUSP; Use as directed 5 mLs in the mouth or throat 3 (three) times daily as needed. -     triamcinolone (KENALOG) 0.1 % paste; Use as directed 1 application in the mouth or throat 2 (two) times daily.  Acute pain of right shoulder  due to trauma Comments: Will monitor and send to Dr. Paulla Fore if not improving.   Morbid obesity ([place]), BMI > 35 with comorbid conditions    . Reviewed expectations re: course of current medical issues. . Discussed self-management of symptoms. . Outlined signs and symptoms indicating need for more acute intervention. . Patient verbalized understanding and all questions were answered. [facility] Health Maintenance issues including appropriate healthy diet, exercise, and smoking avoidance were discussed with patient. . See orders for this visit as documented in the electronic medical record.  Briscoe Deutscher, DO 05/18/2018

## 2018-05-19 ENCOUNTER — Other Ambulatory Visit: Payer: Self-pay

## 2018-05-19 ENCOUNTER — Telehealth (INDEPENDENT_AMBULATORY_CARE_PROVIDER_SITE_OTHER): Payer: PRIVATE HEALTH INSURANCE | Admitting: Psychiatry

## 2018-05-19 DIAGNOSIS — F410 Panic disorder [episodic paroxysmal anxiety] without agoraphobia: Secondary | ICD-10-CM

## 2018-05-19 DIAGNOSIS — F4310 Post-traumatic stress disorder, unspecified: Secondary | ICD-10-CM | POA: Diagnosis not present

## 2018-05-19 DIAGNOSIS — F319 Bipolar disorder, unspecified: Secondary | ICD-10-CM

## 2018-05-19 MED ORDER — ARIPIPRAZOLE 5 MG PO TABS: 5.0000 mg | ORAL_TABLET | Freq: Every day | ORAL | 0 refills | Status: DC

## 2018-05-19 MED ORDER — ALPRAZOLAM 0.5 MG PO TABS: 0.5000 mg | ORAL_TABLET | Freq: Every evening | ORAL | 0 refills | Status: DC | PRN

## 2018-05-19 MED ORDER — DULOXETINE HCL 60 MG PO CPEP: 60.0000 mg | ORAL_CAPSULE | Freq: Every day | ORAL | 0 refills | Status: DC

## 2018-05-19 MED ORDER — LAMOTRIGINE 100 MG PO TABS: ORAL_TABLET | ORAL | 0 refills | Status: DC
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
## 2018-05-29 ENCOUNTER — Other Ambulatory Visit: Payer: Self-pay | Admitting: Pulmonary Disease

## 2018-05-29 ENCOUNTER — Encounter: Payer: Self-pay | Admitting: Family Medicine

## 2018-05-29 DIAGNOSIS — Z1239 Encounter for other screening for malignant neoplasm of breast: Secondary | ICD-10-CM

## 2018-05-30 ENCOUNTER — Other Ambulatory Visit: Payer: Self-pay | Admitting: Family Medicine

## 2018-05-30 DIAGNOSIS — R05 Cough: Secondary | ICD-10-CM

## 2018-05-30 DIAGNOSIS — R059 Cough, unspecified: Secondary | ICD-10-CM

## 2018-05-30 DIAGNOSIS — K219 Gastro-esophageal reflux disease without esophagitis: Secondary | ICD-10-CM

## 2018-05-30 NOTE — Telephone Encounter (Signed)
Last OV 05/17/2018      (Error)  Patient not taking:  Reported on 04/11/2018   Called pt and left VM to call the office.

## 2018-05-31 NOTE — Telephone Encounter (Signed)
Spoke with patient, she says that she takes this medication occasionally, not TID, and has plenty at this time. Will disregard refill request.

## 2018-05-31 NOTE — Telephone Encounter (Signed)
Called pt and left VM to call the office.  

## 2018-06-07 ENCOUNTER — Other Ambulatory Visit: Payer: Self-pay | Admitting: Student

## 2018-06-07 DIAGNOSIS — M545 Low back pain, unspecified: Secondary | ICD-10-CM

## 2018-06-07 DIAGNOSIS — G8929 Other chronic pain: Secondary | ICD-10-CM

## 2018-06-10 ENCOUNTER — Other Ambulatory Visit (HOSPITAL_COMMUNITY): Payer: Self-pay | Admitting: Psychiatry

## 2018-06-10 DIAGNOSIS — F319 Bipolar disorder, unspecified: Secondary | ICD-10-CM

## 2018-06-14 ENCOUNTER — Other Ambulatory Visit (HOSPITAL_COMMUNITY): Payer: Self-pay | Admitting: Psychiatry

## 2018-06-14 ENCOUNTER — Other Ambulatory Visit: Payer: Self-pay

## 2018-06-14 DIAGNOSIS — A64 Unspecified sexually transmitted disease: Secondary | ICD-10-CM

## 2018-06-15 ENCOUNTER — Other Ambulatory Visit: Payer: PRIVATE HEALTH INSURANCE

## 2018-06-16 ENCOUNTER — Other Ambulatory Visit: Payer: PRIVATE HEALTH INSURANCE

## 2018-06-16 ENCOUNTER — Other Ambulatory Visit (HOSPITAL_COMMUNITY): Payer: Self-pay | Admitting: Psychiatry

## 2018-06-17 ENCOUNTER — Other Ambulatory Visit: Payer: Self-pay | Admitting: Neurology

## 2018-06-17 ENCOUNTER — Other Ambulatory Visit (HOSPITAL_COMMUNITY): Payer: Self-pay | Admitting: Psychiatry

## 2018-06-17 DIAGNOSIS — F4310 Post-traumatic stress disorder, unspecified: Secondary | ICD-10-CM

## 2018-06-17 DIAGNOSIS — F410 Panic disorder [episodic paroxysmal anxiety] without agoraphobia: Secondary | ICD-10-CM

## 2018-06-19 ENCOUNTER — Other Ambulatory Visit (HOSPITAL_COMMUNITY)
Admission: RE | Admit: 2018-06-19 | Discharge: 2018-06-19 | Disposition: A | Discharge status: Home / Self Care | Payer: PRIVATE HEALTH INSURANCE | Source: Ambulatory Visit | Attending: Family Medicine | Admitting: Family Medicine

## 2018-06-19 ENCOUNTER — Other Ambulatory Visit (INDEPENDENT_AMBULATORY_CARE_PROVIDER_SITE_OTHER): Payer: PRIVATE HEALTH INSURANCE

## 2018-06-19 ENCOUNTER — Other Ambulatory Visit: Payer: Self-pay

## 2018-06-19 DIAGNOSIS — A64 Unspecified sexually transmitted disease: Secondary | ICD-10-CM | POA: Diagnosis present

## 2018-06-20 ENCOUNTER — Other Ambulatory Visit: Payer: Self-pay | Admitting: Pulmonary Disease

## 2018-06-20 LAB — HSV(HERPES SIMPLEX VRS) I + II AB-IGG
HAV 1 IGG,TYPE SPECIFIC AB: <0.9 index
HSV 2 IGG,TYPE SPECIFIC AB: 15.3 index — ABNORMAL HIGH

## 2018-06-20 LAB — CERVICOVAGINAL ANCILLARY ONLY
Bacterial vaginitis: NEGATIVE
Candida vaginitis: NEGATIVE
Chlamydia: NEGATIVE
Neisseria Gonorrhea: NEGATIVE
Trichomonas: NEGATIVE

## 2018-06-20 LAB — HEPATITIS C ANTIBODY
Hepatitis C Ab: NONREACTIVE
SIGNAL TO CUT-OFF: 0.02 (ref <1.00)

## 2018-06-20 LAB — HIV ANTIBODY (ROUTINE TESTING W REFLEX): HIV 1&2 Ab, 4th Generation: NONREACTIVE

## 2018-06-20 LAB — RPR: RPR Ser Ql: NONREACTIVE

## 2018-06-21 ENCOUNTER — Ambulatory Visit (HOSPITAL_COMMUNITY): Payer: PRIVATE HEALTH INSURANCE | Admitting: Psychiatry

## 2018-06-22 ENCOUNTER — Encounter: Payer: Self-pay | Admitting: Family Medicine

## 2018-06-22 ENCOUNTER — Other Ambulatory Visit: Payer: Self-pay

## 2018-06-22 ENCOUNTER — Ambulatory Visit (INDEPENDENT_AMBULATORY_CARE_PROVIDER_SITE_OTHER): Payer: PRIVATE HEALTH INSURANCE | Admitting: Psychiatry

## 2018-06-22 ENCOUNTER — Encounter (HOSPITAL_COMMUNITY): Payer: Self-pay | Admitting: Psychiatry

## 2018-06-22 DIAGNOSIS — F319 Bipolar disorder, unspecified: Secondary | ICD-10-CM

## 2018-06-22 DIAGNOSIS — F410 Panic disorder [episodic paroxysmal anxiety] without agoraphobia: Secondary | ICD-10-CM | POA: Diagnosis not present

## 2018-06-22 DIAGNOSIS — F4310 Post-traumatic stress disorder, unspecified: Secondary | ICD-10-CM

## 2018-06-22 MED ORDER — LAMOTRIGINE 150 MG PO TABS: 150.0000 mg | ORAL_TABLET | Freq: Every day | ORAL | 1 refills | Status: DC

## 2018-06-22 MED ORDER — DULOXETINE HCL 60 MG PO CPEP: 60.0000 mg | ORAL_CAPSULE | Freq: Every day | ORAL | 1 refills | Status: DC

## 2018-06-22 MED ORDER — ALPRAZOLAM 0.5 MG PO TABS: 0.5000 mg | ORAL_TABLET | Freq: Every evening | ORAL | 1 refills | Status: DC | PRN

## 2018-06-22 MED ORDER — ARIPIPRAZOLE 5 MG PO TABS: 5.0000 mg | ORAL_TABLET | Freq: Every day | ORAL | 1 refills | Status: DC
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
## 2018-06-23 ENCOUNTER — Encounter: Payer: Self-pay | Admitting: Family Medicine

## 2018-06-23 ENCOUNTER — Ambulatory Visit (INDEPENDENT_AMBULATORY_CARE_PROVIDER_SITE_OTHER): Payer: PRIVATE HEALTH INSURANCE | Admitting: Family Medicine

## 2018-06-23 ENCOUNTER — Other Ambulatory Visit: Payer: Self-pay

## 2018-06-23 VITALS — Ht 66.0 in | Wt 226.0 lb

## 2018-06-23 DIAGNOSIS — R894 Abnormal immunological findings in specimens from other organs, systems and tissues: Secondary | ICD-10-CM

## 2018-06-23 DIAGNOSIS — F4323 Adjustment disorder with mixed anxiety and depressed mood: Secondary | ICD-10-CM

## 2018-06-23 NOTE — Progress Notes (Signed)
Pt is having virtual visit w/ Dr. Juleen China on 06/23/18 to discuss her results

## 2018-06-23 NOTE — Patient Instructions (Signed)
[facility] has EMDR [phone]

## 2018-06-23 NOTE — Progress Notes (Signed)
Virtual Visit via Video   Due to the COVID-19 pandemic, this visit was completed with telemedicine (audio/video) technology to reduce patient and provider exposure as well as to preserve personal protective equipment.   I connected with [name] White by a video enabled telemedicine application and verified that I am speaking with the correct person using two identifiers. Location patient: Home Location provider: [facility], Office Persons participating in the virtual visit: [name] White, [name] Deutscher, DO   I discussed the limitations of evaluation and management by telemedicine and the availability of in person appointments. The patient expressed understanding and agreed to proceed.  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 She has moved out of their home and is angry re: the diagnosis and the separation. Lots of questions re: new diagnosis and sex precautions going forward. No known outbreak, other than oral ulcers recently.  Review of Systems  Constitutional: Positive for malaise/fatigue. Negative for chills, fever and weight loss.  Respiratory: Negative for cough, shortness of breath and wheezing.   Cardiovascular: Negative for chest pain, palpitations and leg swelling.  Gastrointestinal: Negative for abdominal pain, constipation, diarrhea, nausea and vomiting.  Genitourinary: Negative for dysuria and urgency.  Musculoskeletal: Negative for joint pain and myalgias.  Skin: Negative for rash.  Neurological: Negative for dizziness and headaches.  Psychiatric/Behavioral: Positive for depression.  Negative for substance abuse and suicidal ideas. The patient is nervous/anxious and has insomnia.     Patient Active Problem List   Diagnosis Date Noted  . Insulin resistance 04/23/2018  . Morbid obesity ([place]), BMI > 35 with comorbid conditions 04/23/2018  . Chronic bilateral low back pain 12/07/2017  . Chronic constipation 12/02/2017  . Strain of flexor muscle of hip 11/23/2017  . Primary osteoarthritis of right hip 04/25/2017  . Gastroparesis 02/15/2017  . Arthropathy of lumbar facet joint 09/24/2016  . Osteoarthritis 09/24/2016  . Chronic pansinusitis 08/26/2016  . Personal history of colonic polyps 05/04/2015  . 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 Ilioinguinal neuralgia 03/11/2015  . 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 Chronic right hip pain 04/16/2014  . Musculoskeletal chest pain 09/25/2013  . Chronic pelvic pain in female 07/06/2012  . Cystocele 07/06/2012  . Rectocele 07/06/2012  . Hypersomnia 01/30/2012  . Obstructive sleep apnea syndrome   . Depressive disorder   . Right lumbosacral radiculopathy 07/10/2010  . Dyslipidemia 05/05/2009  . Chronic headache disorder 09/28/2007    Social History   Tobacco Use  . Smoking status: Former Smoker    Packs/day: 0.50    Years: 30.00    Pack years: 15.00    Types: Cigarettes    Last attempt to quit: 06/21/2015    Years since quitting: 3.0  . Smokeless tobacco: Never Used  Substance Use Topics  . Alcohol use: Yes    Alcohol/week: 0.0 standard drinks    Comment: 2 x a month    Current Outpatient Medications:  .  albuterol (PROAIR HFA) 108 (90 Base) MCG/ACT inhaler, Inhale 2 puffs into the lungs every 6 (six) hours as needed for wheezing or shortness of breath., Disp: 1 Inhaler, Rfl: 0 .  ALPRAZolam (XANAX) 0.5 MG tablet,  Take 1 tablet (0.5 mg total) by mouth at bedtime as needed for anxiety., Disp: 30 tablet, Rfl: 1 .  ARIPiprazole (ABILIFY) 5 MG tablet, Take 1 tablet (5 mg total) by mouth daily.,  Disp: 30 tablet, Rfl: 1 .  Diphenhyd-Hydrocort-Nystatin (FIRST-DUKES MOUTHWASH) SUSP, Use as directed 5 mLs in the mouth or throat 3 (three) times daily as needed., Disp: 237 mL, Rfl: 0 .  DULoxetine (CYMBALTA) 60 MG capsule, Take 1 capsule (60 mg total) by mouth daily., Disp: 30 capsule, Rfl: 1 .  gabapentin (NEURONTIN) 300 MG capsule, Take 300 mg by mouth 3 (three) times daily., Disp: , Rfl:  .  lamoTRIgine (LAMICTAL) 150 MG tablet, Take 1 tablet (150 mg total) by mouth daily. TAKE 1 TABLET BY MOUTH EVERY DAY, Disp: 30 tablet, Rfl: 1 .  linaclotide (LINZESS) 290 MCG CAPS capsule, TAKE 1 TABLET BY MOUTH DAILY, Disp: , Rfl:  .  metFORMIN (GLUCOPHAGE-XR) 500 MG 24 hr tablet, TAKE 1 TABLET BY MOUTH TWICE A DAY, Disp: 180 tablet, Rfl: 1 .  nitroGLYCERIN (NITROSTAT) 0.4 MG SL tablet, nitroglycerin 0.4 mg sublingual tablet  as needed for chest pain, Disp: , Rfl:  .  ondansetron (ZOFRAN) 4 MG tablet, TAKE ONE TABLET BY MOUTH EVERY 8 HOURS AS NEEDED FOR NAUSEA OR VOMITING, Disp: 30 tablet, Rfl: 4 .  pantoprazole (PROTONIX) 40 MG tablet, Take 1 tablet (40 mg total) by mouth daily., Disp: 90 tablet, Rfl: 3 .  rOPINIRole (REQUIP) 1 MG tablet, TAKE 2 TABLETS (2 MG TOTAL) BY MOUTH AT BEDTIME., Disp: 60 tablet, Rfl: 1 .  simvastatin (ZOCOR) 20 MG tablet, Take 1 tablet (20 mg total) by mouth at bedtime., Disp: 90 tablet, Rfl: 3 .  SUMAtriptan (IMITREX) 100 MG tablet, TAKE 1 TABLET AT ONSET OF HEADACHE. MAY REPEAT IN 2 HOURS IF NEED. MAX 2 TABS IN 24 HOURS, Disp: 9 tablet, Rfl: 3 .  SYMBICORT 160-4.5 MCG/ACT inhaler, TAKE 2 PUFFS BY MOUTH TWICE A DAY, Disp: 30.6 Inhaler, Rfl: 1 .  topiramate (TOPAMAX) 200 MG tablet, TAKE 1 TABLET (200 MG TOTAL) BY MOUTH AT BEDTIME., Disp: 90 tablet, Rfl: 1 .  triamcinolone (KENALOG) 0.1 % paste, Use as directed 1 application in the mouth or throat 2 (two) times daily., Disp: 5 g, Rfl: 12  Allergies  Allergen Reactions  . Morphine Hives and Nausea And Vomiting    INTOLERANCE >   N & V  . Penicillins Hives, Itching and Other (See Comments)     PATIENT HAS HAD A PCN REACTION WITH IMMEDIATE RASH, FACIAL/TONGUE/THROAT SWELLING, SOB, OR LIGHTHEADEDNESS WITH HYPOTENSION:  #  #  #  YES  #  #  #   Has patient had a PCN reaction causing severe rash involving mucus membranes or skin necrosis: Unknown Has patient had a PCN reaction that required hospitalization: No Has patient had a PCN reaction occurring within the last 10 years: No If all of the above answers are "NO", then may proceed with Cephalosporin use.    Objective:   VITALS: Per patient if applicable, see vitals. GENERAL: Alert, appears well and in no acute distress. HEENT: Atraumatic, conjunctiva clear, no obvious abnormalities on inspection of external nose and ears. NECK: Normal movements of the head and neck. CARDIOPULMONARY: No increased WOB. Speaking in clear sentences. I:E ratio WNL.  MS: Moves all visible extremities without noticeable abnormality. PSYCH: Pleasant and cooperative, well-groomed. Speech normal rate and rhythm. Affect is appropriate. Insight and judgement are appropriate. Attention is focused, linear, and appropriate.  NEURO: CN grossly intact. Oriented as arrived to appointment on time with no prompting. Moves both UE equally.  SKIN: No obvious lesions, wounds, erythema, or cyanosis noted on face or hands.  Depression screen [facility] 2/9 04/11/2018 09/02/2017 02/08/2017  Decreased Interest 3 3 3   Down, Depressed, Hopeless 3 2 3   PHQ - 2 Score 6 5 6   Altered sleeping 3 3 3   Tired, decreased energy 3 3 3   Change in appetite 3 3 3   Feeling bad or failure about yourself  1 1 3   Trouble concentrating 2 2 2   Moving slowly or fidgety/restless 2 2 0  Suicidal thoughts 1 0 0  PHQ-9 Score 21 19 20   Difficult doing work/chores Very difficult Very difficult Somewhat difficult  Some recent data might be hidden   Assessment and Plan:   Jacquette was seen today for follow-up.  Diagnoses and all orders  for this visit:  Situational mixed anxiety and depressive disorder Comments: Encouraged her to pursue intensive therapy rec by Psychiatry. No red flags today.   Herpes simplex antibody positive Comments: Reviewed diagnosis, treatment, precautions going forward.    Marland Kitchen COVID-19 Education: The signs and symptoms of COVID-19 were discussed with the patient and how to seek care for testing if needed. The importance of social distancing was discussed today. . Reviewed expectations re: course of current medical issues. . Discussed self-management of symptoms. . Outlined signs and symptoms indicating need for more acute intervention. . Patient verbalized understanding and all questions were answered. Marland Kitchen Health Maintenance issues including appropriate healthy diet, exercise, and smoking avoidance were discussed with patient. . See orders for this visit as documented in the electronic medical record.  [name] Deutscher, DO  Records requested if needed. Time spent: 25 minutes, of which >50% was spent in obtaining information about her symptoms, reviewing her previous labs, evaluations, and treatments, counseling her about her condition (please see the discussed topics above), and developing a plan to further investigate it; she had a number of questions which I addressed.

## 2018-07-07 ENCOUNTER — Ambulatory Visit: Payer: Self-pay | Admitting: Neurology

## 2018-07-07 ENCOUNTER — Encounter: Payer: Self-pay | Admitting: Family Medicine

## 2018-07-07 MED ORDER — VALACYCLOVIR HCL 500 MG PO TABS: 500.0000 mg | ORAL_TABLET | Freq: Two times a day (BID) | ORAL | 3 refills | Status: AC

## 2018-07-11 ENCOUNTER — Ambulatory Visit: Payer: Self-pay | Admitting: Neurology

## 2018-07-14 ENCOUNTER — Other Ambulatory Visit (HOSPITAL_COMMUNITY): Payer: Self-pay | Admitting: Psychiatry

## 2018-07-14 ENCOUNTER — Other Ambulatory Visit: Payer: Self-pay | Admitting: Pulmonary Disease

## 2018-07-14 DIAGNOSIS — F319 Bipolar disorder, unspecified: Secondary | ICD-10-CM

## 2018-07-14 NOTE — Telephone Encounter (Signed)
Per Dr. Juanetta Gosling note its states lyric and Requip per primary care?? Are they filling this

## 2018-07-14 NOTE — Telephone Encounter (Signed)
Assessment/Plan from last OV 12/12/17:   Obstructive sleep apnea. - she is compliant with CPAP and reports benefit - continue CPAP 7 cm H2O  Insomnia in setting of depression, anxiety, restless legs and chronic pain. - she has appointment with behavioral health - lyrica, requip per primary care   Looked at pt's medication list and saw that requip was filled for pt 06/20/2018 #[id number] with [facility] and the authorized provider was [facility].    Refill request has been received for the requip. Beth, please advise if you are okay with [place] refilling med for pt based on pt's last OV assessment/plan and also with the fact that it was last authorized by [facility] for a refill. Thanks!

## 2018-07-14 NOTE — Telephone Encounter (Signed)
Attempted to call pt to discuss med with her and to see if PCP has been filling med for her per [facility]'s last OV but unable to reach her. Left message for pt to return call. Will discuss this with her once pt calls back.

## 2018-07-16 ENCOUNTER — Other Ambulatory Visit: Payer: Self-pay

## 2018-07-16 ENCOUNTER — Ambulatory Visit (INDEPENDENT_AMBULATORY_CARE_PROVIDER_SITE_OTHER): Payer: PRIVATE HEALTH INSURANCE

## 2018-07-16 DIAGNOSIS — M545 Low back pain, unspecified: Secondary | ICD-10-CM

## 2018-07-16 DIAGNOSIS — G8929 Other chronic pain: Secondary | ICD-10-CM

## 2018-07-24 ENCOUNTER — Encounter: Payer: Self-pay | Admitting: Family Medicine

## 2018-07-26 ENCOUNTER — Telehealth: Payer: Self-pay | Admitting: Pulmonary Disease

## 2018-07-26 MED ORDER — VALACYCLOVIR HCL 1 G PO TABS: 1000.0000 mg | ORAL_TABLET | Freq: Two times a day (BID) | ORAL | 0 refills | Status: DC

## 2018-07-26 NOTE — Telephone Encounter (Signed)
Assessment/Plan:   Obstructive sleep apnea. - she is compliant with CPAP and reports benefit - continue CPAP 7 cm H2O  Insomnia in setting of depression, anxiety, restless legs and chronic pain. - she has appointment with behavioral health - lyrica, requip per primary care   Called and spoke with pt stating the info to her from last OV with [facility] in regards to requip being handled by PCP and pt verbalized understanding. Nothing further needed.

## 2018-07-27 ENCOUNTER — Other Ambulatory Visit (HOSPITAL_BASED_OUTPATIENT_CLINIC_OR_DEPARTMENT_OTHER): Payer: Self-pay | Admitting: Student

## 2018-07-27 ENCOUNTER — Encounter (HOSPITAL_COMMUNITY): Payer: Self-pay | Admitting: Psychiatry

## 2018-07-27 ENCOUNTER — Other Ambulatory Visit: Payer: Self-pay | Admitting: Student

## 2018-07-27 ENCOUNTER — Other Ambulatory Visit: Payer: Self-pay

## 2018-07-27 ENCOUNTER — Ambulatory Visit (INDEPENDENT_AMBULATORY_CARE_PROVIDER_SITE_OTHER): Payer: PRIVATE HEALTH INSURANCE | Admitting: Psychiatry

## 2018-07-27 DIAGNOSIS — F319 Bipolar disorder, unspecified: Secondary | ICD-10-CM

## 2018-07-27 DIAGNOSIS — F410 Panic disorder [episodic paroxysmal anxiety] without agoraphobia: Secondary | ICD-10-CM

## 2018-07-27 DIAGNOSIS — F4310 Post-traumatic stress disorder, unspecified: Secondary | ICD-10-CM | POA: Diagnosis not present

## 2018-07-27 DIAGNOSIS — Z96641 Presence of right artificial hip joint: Secondary | ICD-10-CM

## 2018-07-27 DIAGNOSIS — M7061 Trochanteric bursitis, right hip: Secondary | ICD-10-CM

## 2018-07-27 MED ORDER — ALPRAZOLAM 0.5 MG PO TABS: 0.5000 mg | ORAL_TABLET | Freq: Every evening | ORAL | 2 refills | Status: DC | PRN

## 2018-07-27 MED ORDER — DULOXETINE HCL 60 MG PO CPEP: 60.0000 mg | ORAL_CAPSULE | Freq: Every day | ORAL | 2 refills | Status: DC

## 2018-07-27 MED ORDER — VALACYCLOVIR HCL 500 MG PO TABS: 500.0000 mg | ORAL_TABLET | Freq: Every day | ORAL | 1 refills | Status: DC

## 2018-07-27 MED ORDER — LAMOTRIGINE 150 MG PO TABS: 150.0000 mg | ORAL_TABLET | Freq: Every day | ORAL | 2 refills | Status: DC

## 2018-07-27 MED ORDER — ARIPIPRAZOLE 5 MG PO TABS: 5.0000 mg | ORAL_TABLET | Freq: Every day | ORAL | 2 refills | Status: DC

## 2018-07-27 NOTE — Addendum Note (Signed)
Addended by: Jasper Loser on: 07/27/2018 04:35 PM   Modules accepted: Orders
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
## 2018-07-30 ENCOUNTER — Other Ambulatory Visit: Payer: Self-pay | Admitting: Family Medicine

## 2018-07-30 DIAGNOSIS — F332 Major depressive disorder, recurrent severe without psychotic features: Secondary | ICD-10-CM

## 2018-08-04 ENCOUNTER — Encounter: Payer: Self-pay | Admitting: Family Medicine

## 2018-08-06 ENCOUNTER — Ambulatory Visit: Payer: PRIVATE HEALTH INSURANCE

## 2018-08-06 ENCOUNTER — Other Ambulatory Visit: Payer: Self-pay

## 2018-08-06 DIAGNOSIS — Z96641 Presence of right artificial hip joint: Secondary | ICD-10-CM

## 2018-08-06 DIAGNOSIS — M7061 Trochanteric bursitis, right hip: Secondary | ICD-10-CM

## 2018-08-08 NOTE — Telephone Encounter (Signed)
Patient came by and pick up sample and was showed how to use. Given a log to document readings

## 2018-08-10 ENCOUNTER — Other Ambulatory Visit: Payer: Self-pay | Admitting: Gastroenterology

## 2018-08-11 ENCOUNTER — Inpatient Hospital Stay: Admission: RE | Admit: 2018-08-11 | Payer: Self-pay | Source: Ambulatory Visit

## 2018-08-14 ENCOUNTER — Encounter: Payer: Self-pay | Admitting: Family Medicine

## 2018-08-15 ENCOUNTER — Other Ambulatory Visit: Payer: Self-pay | Admitting: Pulmonary Disease

## 2018-09-05 ENCOUNTER — Ambulatory Visit: Payer: Self-pay | Admitting: *Deleted

## 2018-09-05 NOTE — Telephone Encounter (Signed)
Hailey, please call pt and schedule IN OFFICE visit with Dr. Juleen China to follow up on blood sugar.

## 2018-09-05 NOTE — Telephone Encounter (Signed)
Per initial encounter: Message from Pauline Good sent at 09/05/2018 11:52 AM EDT  Pt need to sched an appt for BloodSugar and f/u.   Call History   Type Contact  09/05/2018 12:05 PM EDT Phone (Outgoing) Hudgins, Margarette Vannatter "Cindy" (Self)  Phone: [phone] (H)  User: Addison Naegeli, RN  Left Message   09/05/2018 11:51 AM EDT Phone (Incoming) Puchalski, Barbette Or "Cindy" (Self)  Phone: [phone] (H)  User: Pauline Good       Attempted to contact pt; left message on voicemail; will route to office for final disposition.  Reason for Disposition . Requesting regular office appointment  Protocols used: INFORMATION ONLY CALL - NO TRIAGE-A-AH
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
## 2018-09-07 ENCOUNTER — Encounter: Payer: Self-pay | Admitting: Student in an Organized Health Care Education/Training Program

## 2018-09-07 ENCOUNTER — Ambulatory Visit
Payer: PRIVATE HEALTH INSURANCE | Attending: Student in an Organized Health Care Education/Training Program | Admitting: Student in an Organized Health Care Education/Training Program

## 2018-09-07 ENCOUNTER — Other Ambulatory Visit: Payer: Self-pay

## 2018-09-07 VITALS — BP 99/70 | HR 78 | Temp 98.7°F | Resp 16 | Ht 66.0 in | Wt 237.0 lb

## 2018-09-07 DIAGNOSIS — M47818 Spondylosis without myelopathy or radiculopathy, sacral and sacrococcygeal region: Secondary | ICD-10-CM

## 2018-09-07 DIAGNOSIS — R51 Headache: Secondary | ICD-10-CM

## 2018-09-07 DIAGNOSIS — R102 Pelvic and perineal pain: Secondary | ICD-10-CM

## 2018-09-07 DIAGNOSIS — G5791 Unspecified mononeuropathy of right lower limb: Secondary | ICD-10-CM

## 2018-09-07 DIAGNOSIS — G894 Chronic pain syndrome: Secondary | ICD-10-CM

## 2018-09-07 DIAGNOSIS — R519 Headache, unspecified: Secondary | ICD-10-CM

## 2018-09-07 DIAGNOSIS — M1611 Unilateral primary osteoarthritis, right hip: Secondary | ICD-10-CM | POA: Diagnosis not present

## 2018-09-07 DIAGNOSIS — M25551 Pain in right hip: Secondary | ICD-10-CM

## 2018-09-07 DIAGNOSIS — G8929 Other chronic pain: Secondary | ICD-10-CM

## 2018-09-07 NOTE — Progress Notes (Signed)
Safety precautions to be maintained throughout the outpatient stay will include: orient to surroundings, keep bed in low position, maintain call bell within reach at all times, provide assistance with transfer out of bed and ambulation.  

## 2018-09-07 NOTE — Patient Instructions (Signed)
GENERAL RISKS AND COMPLICATIONS  What are the risk, side effects and possible complications? Generally speaking, most procedures are safe.  However, with any procedure there are risks, side effects, and the possibility of complications.  The risks and complications are dependent upon the sites that are lesioned, or the type of nerve block to be performed.  The closer the procedure is to the spine, the more serious the risks are.  Great care is taken when placing the radio frequency needles, block needles or lesioning probes, but sometimes complications can occur. 1. Infection: Any time there is an injection through the skin, there is a risk of infection.  This is why sterile conditions are used for these blocks.  There are four possible types of infection. 1. Localized skin infection. 2. Central Nervous System Infection-This can be in the form of Meningitis, which can be deadly. 3. Epidural Infections-This can be in the form of an epidural abscess, which can cause pressure inside of the spine, causing compression of the spinal cord with subsequent paralysis. This would require an emergency surgery to decompress, and there are no guarantees that the patient would recover from the paralysis. 4. Discitis-This is an infection of the intervertebral discs.  It occurs in about 1% of discography procedures.  It is difficult to treat and it may lead to surgery.        2. Pain: the needles have to go through skin and soft tissues, will cause soreness.       3. Damage to internal structures:  The nerves to be lesioned may be near blood vessels or    other nerves which can be potentially damaged.       4. Bleeding: Bleeding is more common if the patient is taking blood thinners such as  aspirin, Coumadin, Ticiid, Plavix, etc., or if he/she have some genetic predisposition  such as hemophilia. Bleeding into the spinal canal can cause compression of the spinal  cord with subsequent paralysis.  This would require an  emergency surgery to  decompress and there are no guarantees that the patient would recover from the  paralysis.       5. Pneumothorax:  Puncturing of a lung is a possibility, every time a needle is introduced in  the area of the chest or upper back.  Pneumothorax refers to free air around the  collapsed lung(s), inside of the thoracic cavity (chest cavity).  Another two possible  complications related to a similar event would include: Hemothorax and Chylothorax.   These are variations of the Pneumothorax, where instead of air around the collapsed  lung(s), you may have blood or chyle, respectively.       6. Spinal headaches: They may occur with any procedures in the area of the spine.       7. Persistent CSF (Cerebro-Spinal Fluid) leakage: This is a rare problem, but may occur  with prolonged intrathecal or epidural catheters either due to the formation of a fistulous  track or a dural tear.       8. Nerve damage: By working so close to the spinal cord, there is always a possibility of  nerve damage, which could be as serious as a permanent spinal cord injury with  paralysis.       9. Death:  Although rare, severe deadly allergic reactions known as "Anaphylactic  reaction" can occur to any of the medications used.      10. Worsening of the symptoms:  We can always make thing worse.    What are the chances of something like this happening? Chances of any of this occuring are extremely low.  By statistics, you have more of a chance of getting killed in a motor vehicle accident: while driving to the hospital than any of the above occurring .  Nevertheless, you should be aware that they are possibilities.  In general, it is similar to taking a shower.  Everybody knows that you can slip, hit your head and get killed.  Does that mean that you should not shower again?  Nevertheless always keep in mind that statistics do not mean anything if you happen to be on the wrong side of them.  Even if a procedure has a 1  (one) in a 1,000,000 (million) chance of going wrong, it you happen to be that one..Also, keep in mind that by statistics, you have more of a chance of having something go wrong when taking medications.  Who should not have this procedure? If you are on a blood thinning medication (e.g. Coumadin, Plavix, see list of "Blood Thinners"), or if you have an active infection going on, you should not have the procedure.  If you are taking any blood thinners, please inform your physician.  How should I prepare for this procedure?  Do not eat or drink anything at least six hours prior to the procedure.  Bring a driver with you .  It cannot be a taxi.  Come accompanied by an adult that can drive you back, and that is strong enough to help you if your legs get weak or numb from the local anesthetic.  Take all of your medicines the morning of the procedure with just enough water to swallow them.  If you have diabetes, make sure that you are scheduled to have your procedure done first thing in the morning, whenever possible.  If you have diabetes, take only half of your insulin dose and notify our nurse that you have done so as soon as you arrive at the clinic.  If you are diabetic, but only take blood sugar pills (oral hypoglycemic), then do not take them on the morning of your procedure.  You may take them after you have had the procedure.  Do not take aspirin or any aspirin-containing medications, at least eleven (11) days prior to the procedure.  They may prolong bleeding.  Wear loose fitting clothing that may be easy to take off and that you would not mind if it got stained with Betadine or blood.  Do not wear any jewelry or perfume  Remove any nail coloring.  It will interfere with some of our monitoring equipment.  NOTE: Remember that this is not meant to be interpreted as a complete list of all possible complications.  Unforeseen problems may occur.  BLOOD THINNERS The following drugs  contain aspirin or other products, which can cause increased bleeding during surgery and should not be taken for 2 weeks prior to and 1 week after surgery.  If you should need take something for relief of minor pain, you may take acetaminophen which is found in Tylenol,m Datril, Anacin-3 and Panadol. It is not blood thinner. The products listed below are.  Do not take any of the products listed below in addition to any listed on your instruction sheet.  A.P.C or A.P.C with Codeine Codeine Phosphate Capsules #3 Ibuprofen Ridaura  ABC compound Congesprin Imuran rimadil  Advil Cope Indocin Robaxisal  Alka-Seltzer Effervescent Pain Reliever and Antacid Coricidin or Coricidin-D  Indomethacin Rufen    Alka-Seltzer plus Cold Medicine Cosprin Ketoprofen S-A-C Tablets  Anacin Analgesic Tablets or Capsules Coumadin Korlgesic Salflex  Anacin Extra Strength Analgesic tablets or capsules CP-2 Tablets Lanoril Salicylate  Anaprox Cuprimine Capsules Levenox Salocol  Anexsia-D Dalteparin Magan Salsalate  Anodynos Darvon compound Magnesium Salicylate Sine-off  Ansaid Dasin Capsules Magsal Sodium Salicylate  Anturane Depen Capsules Marnal Soma  APF Arthritis pain formula Dewitt's Pills Measurin Stanback  Argesic Dia-Gesic Meclofenamic Sulfinpyrazone  Arthritis Bayer Timed Release Aspirin Diclofenac Meclomen Sulindac  Arthritis pain formula Anacin Dicumarol Medipren Supac  Analgesic (Safety coated) Arthralgen Diffunasal Mefanamic Suprofen  Arthritis Strength Bufferin Dihydrocodeine Mepro Compound Suprol  Arthropan liquid Dopirydamole Methcarbomol with Aspirin Synalgos  ASA tablets/Enseals Disalcid Micrainin Tagament  Ascriptin Doan's Midol Talwin  Ascriptin A/D Dolene Mobidin Tanderil  Ascriptin Extra Strength Dolobid Moblgesic Ticlid  Ascriptin with Codeine Doloprin or Doloprin with Codeine Momentum Tolectin  Asperbuf Duoprin Mono-gesic Trendar  Aspergum Duradyne Motrin or Motrin IB Triminicin  Aspirin  plain, buffered or enteric coated Durasal Myochrisine Trigesic  Aspirin Suppositories Easprin Nalfon Trillsate  Aspirin with Codeine Ecotrin Regular or Extra Strength Naprosyn Uracel  Atromid-S Efficin Naproxen Ursinus  Auranofin Capsules Elmiron Neocylate Vanquish  Axotal Emagrin Norgesic Verin  Azathioprine Empirin or Empirin with Codeine Normiflo Vitamin E  Azolid Emprazil Nuprin Voltaren  Bayer Aspirin plain, buffered or children's or timed BC Tablets or powders Encaprin Orgaran Warfarin Sodium  Buff-a-Comp Enoxaparin Orudis Zorpin  Buff-a-Comp with Codeine Equegesic Os-Cal-Gesic   Buffaprin Excedrin plain, buffered or Extra Strength Oxalid   Bufferin Arthritis Strength Feldene Oxphenbutazone   Bufferin plain or Extra Strength Feldene Capsules Oxycodone with Aspirin   Bufferin with Codeine Fenoprofen Fenoprofen Pabalate or Pabalate-SF   Buffets II Flogesic Panagesic   Buffinol plain or Extra Strength Florinal or Florinal with Codeine Panwarfarin   Buf-Tabs Flurbiprofen Penicillamine   Butalbital Compound Four-way cold tablets Penicillin   Butazolidin Fragmin Pepto-Bismol   Carbenicillin Geminisyn Percodan   Carna Arthritis Reliever Geopen Persantine   Carprofen Gold's salt Persistin   Chloramphenicol Goody's Phenylbutazone   Chloromycetin Haltrain Piroxlcam   Clmetidine heparin Plaquenil   Cllnoril Hyco-pap Ponstel   Clofibrate Hydroxy chloroquine Propoxyphen         Before stopping any of these medications, be sure to consult the physician who ordered them.  Some, such as Coumadin (Warfarin) are ordered to prevent or treat serious conditions such as "deep thrombosis", "pumonary embolisms", and other heart problems.  The amount of time that you may need off of the medication may also vary with the medication and the reason for which you were taking it.  If you are taking any of these medications, please make sure you notify your pain physician before you undergo any  procedures.         Sacroiliac (SI) Joint Injection Patient Information  Description: The sacroiliac joint connects the scrum (very low back and tailbone) to the ilium (a pelvic bone which also forms half of the hip joint).  Normally this joint experiences very little motion.  When this joint becomes inflamed or unstable low back and or hip and pelvis pain may result.  Injection of this joint with local anesthetics (numbing medicines) and steroids can provide diagnostic information and reduce pain.  This injection is performed with the aid of x-ray guidance into the tailbone area while you are lying on your stomach.   You may experience an electrical sensation down the leg while this is being done.  You may also experience numbness.  We   also may ask if we are reproducing your normal pain during the injection.  Conditions which may be treated SI injection:   Low back, buttock, hip or leg pain  Preparation for the Injection:  1. Do not eat any solid food or dairy products within 8 hours of your appointment.  2. You may drink clear liquids up to 3 hours before appointment.  Clear liquids include water, black coffee, juice or soda.  No milk or cream please. 3. You may take your regular medications, including pain medications with a sip of water before your appointment.  Diabetics should hold regular insulin (if take separately) and take 1/2 normal NPH dose the morning of the procedure.  Carry some sugar containing items with you to your appointment. 4. A driver must accompany you and be prepared to drive you home after your procedure. 5. Bring all of your current medications with you. 6. An IV may be inserted and sedation may be given at the discretion of the physician. 7. A blood pressure cuff, EKG and other monitors will often be applied during the procedure.  Some patients may need to have extra oxygen administered for a short period.  8. You will be asked to provide medical information,  including your allergies, prior to the procedure.  We must know immediately if you are taking blood thinners (like Coumadin/Warfarin) or if you are allergic to IV iodine contrast (dye).  We must know if you could possible be pregnant.  Possible side effects:   Bleeding from needle site  Infection (rare, may require surgery)  Nerve injury (rare)  Numbness & tingling (temporary)  A brief convulsion or seizure  Light-headedness (temporary)  Pain at injection site (several days)  Decreased blood pressure (temporary)  Weakness in the leg (temporary)   Call if you experience:   New onset weakness or numbness of an extremity below the injection site that last more than 8 hours.  Hives or difficulty breathing ( go to the emergency room)  Inflammation or drainage at the injection site  Any new symptoms which are concerning to you  Please note:  Although the local anesthetic injected can often make your back/ hip/ buttock/ leg feel good for several hours after the injections, the pain will likely return.  It takes 3-7 days for steroids to work in the sacroiliac area.  You may not notice any pain relief for at least that one week.  If effective, we will often do a series of three injections spaced 3-6 weeks apart to maximally decrease your pain.  After the initial series, we generally will wait some months before a repeat injection of the same type.  If you have any questions, please call [phone] [facility]   

## 2018-09-08 ENCOUNTER — Ambulatory Visit: Payer: PRIVATE HEALTH INSURANCE | Admitting: Family Medicine

## 2018-09-12 ENCOUNTER — Encounter: Payer: Self-pay | Admitting: Physical Medicine & Rehabilitation

## 2018-09-20 ENCOUNTER — Ambulatory Visit (INDEPENDENT_AMBULATORY_CARE_PROVIDER_SITE_OTHER): Payer: PRIVATE HEALTH INSURANCE | Admitting: Family Medicine

## 2018-09-20 ENCOUNTER — Encounter: Payer: Self-pay | Admitting: Family Medicine

## 2018-09-20 ENCOUNTER — Other Ambulatory Visit: Payer: Self-pay

## 2018-09-20 VITALS — BP 100/70 | HR 90 | Temp 98.2°F | Ht 66.0 in | Wt 236.0 lb

## 2018-09-20 DIAGNOSIS — R519 Headache, unspecified: Secondary | ICD-10-CM

## 2018-09-20 DIAGNOSIS — E8881 Metabolic syndrome: Secondary | ICD-10-CM

## 2018-09-20 DIAGNOSIS — G8929 Other chronic pain: Secondary | ICD-10-CM

## 2018-09-20 DIAGNOSIS — R51 Headache: Secondary | ICD-10-CM

## 2018-09-20 DIAGNOSIS — E88819 Insulin resistance, unspecified: Secondary | ICD-10-CM

## 2018-09-20 DIAGNOSIS — K5909 Other constipation: Secondary | ICD-10-CM

## 2018-09-20 MED ORDER — ROPINIROLE HCL 1 MG PO TABS: 2.0000 mg | ORAL_TABLET | Freq: Every day | ORAL | 1 refills | Status: DC

## 2018-09-20 NOTE — Progress Notes (Signed)
[name] White is a 53 y.o. female is here for follow up.  History of Present Illness:   HPI:  Psych: Followed by Dr. Adele Schilder. No EMDR yet - called [facility] and they are working on insurance coverage. Tolerating current medications. Still struggling with depression, anxiety, and anger.  Neuro: No headaches. Feels foggy. Some memory issues and inability to focus.  Pain:  Patient explained that she recently fell on 09/08/2018. She was dancing, leg went out, and she fell onto her low back/buttocks. Was able to get up on her own after a minute. No new pain, though worse. Saw new pain management physician. Waiting for insurance to cover injections. Not on Dilaudid anymore.   Metabolic: Insulin resistance, weight stable. No appetite. Increased abdominal girth.  Health Maintenance Due  Topic Date Due  . MAMMOGRAM  06/19/2018   Depression screen [facility] 2/9 09/20/2018 04/11/2018 09/02/2017  Decreased Interest 3 3 3   Down, Depressed, Hopeless 1 3 2   PHQ - 2 Score 4 6 5   Altered sleeping 3 3 3   Tired, decreased energy 3 3 3   Change in appetite 3 3 3   Feeling bad or failure about yourself  1 1 1   Trouble concentrating 2 2 2   Moving slowly or fidgety/restless 3 2 2   Suicidal thoughts 0 1 0  PHQ-9 Score 19 21 19   Difficult doing work/chores Very difficult Very difficult Very difficult  Some recent data might be hidden   PMHx, SurgHx, SocialHx, FamHx, Medications, and Allergies were reviewed in the Visit Navigator and updated as appropriate.   Patient Active Problem List   Diagnosis Date Noted  . Insulin resistance 04/23/2018  . Morbid obesity ([place]), BMI > 35 with comorbid conditions 04/23/2018  . Chronic bilateral low back pain 12/07/2017  . Chronic constipation 12/02/2017  . Strain of flexor muscle of hip 11/23/2017  . Primary osteoarthritis of right hip 04/25/2017  . Gastroparesis 02/15/2017  . Arthropathy of lumbar facet joint 09/24/2016  . Osteoarthritis 09/24/2016  . Chronic  pansinusitis 08/26/2016  . Personal history of colonic polyps 05/04/2015  . 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 Ilioinguinal neuralgia 03/11/2015  . 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 Chronic right hip pain 04/16/2014  . Musculoskeletal chest pain 09/25/2013  . Chronic pelvic pain in female 07/06/2012  . Cystocele 07/06/2012  . Rectocele 07/06/2012  . Hypersomnia 01/30/2012  . Obstructive sleep apnea syndrome   . Depressive disorder   . Right lumbosacral radiculopathy 07/10/2010  . Dyslipidemia 05/05/2009  . Chronic headache disorder 09/28/2007   Social History   Tobacco Use  . Smoking status: Former Smoker    Packs/day: 0.50    Years: 30.00    Pack years: 15.00    Types: Cigarettes    Quit date: 06/21/2015    Years since quitting: 3.2  . Smokeless tobacco: Never Used  Substance Use Topics  . Alcohol use: Yes    Alcohol/week: 0.0 standard drinks    Comment: 2 x a month  . Drug use: No   Current Medications and Allergies   .  acetaminophen (TYLENOL) 500 MG tablet, Take 3,000 mg by mouth., Disp: , Rfl:  .  ALPRAZolam (XANAX) 0.5 MG tablet, Take 1 tablet (0.5 mg total) by mouth at bedtime as needed for anxiety., Disp: 30 tablet, Rfl: 2 .  ARIPiprazole (ABILIFY) 5 MG tablet, Take 1 tablet (5 mg total) by mouth daily., Disp: 30 tablet, Rfl: 2 .  DULoxetine (CYMBALTA) 60 MG capsule, Take 1  capsule (60 mg total) by mouth daily., Disp: 30 capsule, Rfl: 2 .  gabapentin (NEURONTIN) 300 MG capsule, Take 300 mg by mouth 3 (three) times daily., Disp: , Rfl:  .  ibuprofen (ADVIL) 200 MG tablet, Take 400 mg by mouth every 6 (six) hours as needed., Disp: , Rfl:  .  lamoTRIgine (LAMICTAL) 150 MG tablet, Take 1 tablet (150 mg total) by mouth daily. TAKE 1 TABLET BY MOUTH EVERY DAY, Disp: 30 tablet, Rfl: 2 .  linaclotide (LINZESS) 290 MCG CAPS capsule, TAKE 1 TABLET BY MOUTH DAILY prn constipation .  metFORMIN (GLUCOPHAGE-XR) 500 MG 24 hr tablet, TAKE 1 TABLET BY  MOUTH TWICE A DAY, Disp: 180 tablet, Rfl: 1 .  nitroGLYCERIN (NITROSTAT) 0.4 MG SL tablet, nitroglycerin 0.4 mg sublingual tablet  as needed for chest pain, Disp: , Rfl:  .  ondansetron (ZOFRAN) 4 MG tablet, TAKE ONE TABLET BY MOUTH EVERY 8 HOURS AS NEEDED FOR NAUSEA OR VOMITING, Disp: 30 tablet, Rfl: 4 .  pantoprazole (PROTONIX) 40 MG tablet, TAKE 1 TABLET BY MOUTH EVERY DAY, Disp: 90 tablet, Rfl: 3 .  rOPINIRole (REQUIP) 1 MG tablet, TAKE 2 TABLETS (2 MG TOTAL) BY MOUTH AT BEDTIME., Disp: 60 tablet, Rfl: 1 .  simvastatin (ZOCOR) 20 MG tablet, Take 1 tablet (20 mg total) by mouth at bedtime., Disp: 90 tablet, Rfl: 3 .  SUMAtriptan (IMITREX) 100 MG tablet, TAKE 1 TABLET AT ONSET OF HEADACHE. MAY REPEAT IN 2 HOURS IF NEED. MAX 2 TABS IN 24 HOURS, Disp: 9 tablet, Rfl: 3 .  topiramate (TOPAMAX) 200 MG tablet, TAKE 1 TABLET (200 MG TOTAL) BY MOUTH AT BEDTIME., Disp: 90 tablet, Rfl: 1 .  valACYclovir (VALTREX) 500 MG tablet, Take 1 tablet (500 mg total) by mouth daily., Disp: 90 tablet, Rfl: 1   Allergies  Allergen Reactions  . Morphine Hives and Nausea And Vomiting    INTOLERANCE >  N & V  . Penicillins Hives, Itching and Other (See Comments)     PATIENT HAS HAD A PCN REACTION WITH IMMEDIATE RASH, FACIAL/TONGUE/THROAT SWELLING, SOB, OR LIGHTHEADEDNESS WITH HYPOTENSION:  #  #  #  YES  #  #  #   Has patient had a PCN reaction causing severe rash involving mucus membranes or skin necrosis: Unknown Has patient had a PCN reaction that required hospitalization: No Has patient had a PCN reaction occurring within the last 10 years: No If all of the above answers are "NO", then may proceed with Cephalosporin use.    Review of Systems   Pertinent items are noted in the HPI. Otherwise, a complete ROS is negative.  Vitals   Vitals:   09/20/18 1151  BP: 100/70  Pulse: 90  Temp: 98.2 F (36.8 C)  TempSrc: Temporal  SpO2: 98%  Weight: 236 lb (107 kg)  Height: 5\' 6"  (1.676 m)     Body mass index  is 38.09 kg/m.  Physical Exam   Physical Exam Vitals signs and nursing note reviewed.  HENT:     Head: Normocephalic and atraumatic.  Eyes:     Pupils: Pupils are equal, round, and reactive to light.  Neck:     Musculoskeletal: Normal range of motion and neck supple.  Cardiovascular:     Rate and Rhythm: Normal rate and regular rhythm.     Heart sounds: Normal heart sounds.  Pulmonary:     Effort: Pulmonary effort is normal.  Abdominal:     Palpations: Abdomen is soft.  Skin:  General: Skin is warm.  Psychiatric:        Behavior: Behavior normal.    Assessment and Plan   Jaelin was seen today for follow-up.  Diagnoses and all orders for this visit:  Insulin resistance Comments: Sopt Metformin. Start GLP1RA to help combat obesogenic medications.   Morbid obesity ([place]), BMI > 35 with comorbid conditions Comments: Discussed MWM and provided OMA book.  Chronic constipation Comments: Now under control. Uses Lizess occasionally.  Chronic nonintractable headache, unspecified headache type Comments: Controlled on current regimen. Tried to reduce Topamax to decrease fogginess, but had severe headaches.  Other chronic pain Comments: Followed by new Pain Clinic. Awaiting injections. Hopeful.    . Orders and follow up as documented in [place], reviewed diet, exercise and weight control, cardiovascular risk and specific lipid/LDL goals reviewed, reviewed medications and side effects in detail.  . Reviewed expectations re: course of current medical issues. . Outlined signs and symptoms indicating need for more acute intervention. . Patient verbalized understanding and all questions were answered. . Patient received an After Visit Summary.   Briscoe Deutscher, DO [place] 09/20/2018

## 2018-10-02 ENCOUNTER — Ambulatory Visit (HOSPITAL_BASED_OUTPATIENT_CLINIC_OR_DEPARTMENT_OTHER): Payer: PRIVATE HEALTH INSURANCE | Admitting: Student in an Organized Health Care Education/Training Program

## 2018-10-02 ENCOUNTER — Encounter: Payer: Self-pay | Admitting: Student in an Organized Health Care Education/Training Program

## 2018-10-02 ENCOUNTER — Ambulatory Visit
Admission: RE | Admit: 2018-10-02 | Discharge: 2018-10-02 | Disposition: A | Discharge status: Home / Self Care | Payer: PRIVATE HEALTH INSURANCE | Source: Ambulatory Visit | Attending: Student in an Organized Health Care Education/Training Program | Admitting: Student in an Organized Health Care Education/Training Program

## 2018-10-02 ENCOUNTER — Other Ambulatory Visit: Payer: Self-pay

## 2018-10-02 DIAGNOSIS — M47818 Spondylosis without myelopathy or radiculopathy, sacral and sacrococcygeal region: Secondary | ICD-10-CM | POA: Diagnosis present

## 2018-10-02 MED ORDER — FENTANYL CITRATE (PF) 100 MCG/2ML IJ SOLN
25.0000 ug | INTRAMUSCULAR | Status: DC | PRN
  Administered 2018-10-02: 75 ug via INTRAVENOUS
  Filled 2018-10-02: qty 2

## 2018-10-02 MED ORDER — ROPIVACAINE HCL 2 MG/ML IJ SOLN
2.0000 mL | Freq: Once | INTRAMUSCULAR | Status: AC
  Administered 2018-10-02: 10 mL via EPIDURAL
  Filled 2018-10-02: qty 10

## 2018-10-02 MED ORDER — DEXAMETHASONE SODIUM PHOSPHATE 10 MG/ML IJ SOLN
10.0000 mg | Freq: Once | INTRAMUSCULAR | Status: AC
  Administered 2018-10-02: 12:00:00 10 mg
  Filled 2018-10-02: qty 1

## 2018-10-02 MED ORDER — LIDOCAINE HCL 2 % IJ SOLN
20.0000 mL | Freq: Once | INTRAMUSCULAR | Status: AC
  Administered 2018-10-02: 400 mg
  Filled 2018-10-02: qty 200

## 2018-10-02 MED ORDER — DEXAMETHASONE SODIUM PHOSPHATE 10 MG/ML IJ SOLN
10.0000 mg | Freq: Once | INTRAMUSCULAR | Status: AC
  Administered 2018-10-02: 10 mg
  Filled 2018-10-02: qty 1

## 2018-10-02 MED ORDER — IOHEXOL 180 MG/ML  SOLN
10.0000 mL | Freq: Once | INTRAMUSCULAR | Status: AC
  Administered 2018-10-02: 10 mL via EPIDURAL

## 2018-10-02 NOTE — Patient Instructions (Signed)
Post-procedure Information What to expect: Most procedures involve the use of a local anesthetic (numbing medicine), and a steroid (anti-inflammatory medicine).  The local anesthetics may cause temporary numbness and weakness of the legs or arms, depending on the location of the block. This numbness/weakness may last 4-6 hours, depending on the local anesthetic used. In rare instances, it can last up to 24 hours. While numb, you must be very careful not to injure the extremity.  After any procedure, you could expect the pain to get better within 15-20 minutes. This relief is temporary and may last 4-6 hours. Once the local anesthetics wears off, you could experience discomfort, possibly more than usual, for up to 10 (ten) days. In the [place] of radiofrequencies, it may last up to 6 weeks. Surgeries may take up to 8 weeks for the healing process. The discomfort is due to the irritation caused by needles going through skin and muscle. To minimize the discomfort, we recommend using ice the first day, and heat from then on. The ice should be applied for 15 minutes on, and 15 minutes off. Keep repeating this cycle until bedtime. Avoid applying the ice directly to the skin, to prevent frostbite. Heat should be used daily, until the pain improves (4-10 days). Be careful not to burn yourself.  Occasionally you may experience muscle spasms or cramps. These occur as a consequence of the irritation caused by the needle sticks to the muscle and the blood that will inevitably be lost into the surrounding muscle tissue. Blood tends to be very irritating to tissues, which tend to react by going into spasm. These spasms may start the same day of your procedure, but they may also take days to develop. This late onset type of spasm or cramp is usually caused by electrolyte imbalances triggered by the steroids, at the level of the kidney. Cramps and spasms tend to respond well to muscle relaxants, multivitamins (some are  triggered by the procedure, but may have their origins in vitamin deficiencies), and "Gatorade", or any sports drinks that can replenish any electrolyte imbalances. (If you are a diabetic, ask your pharmacist to get you a sugar-free brand.) Warm showers or baths may also be helpful. Stretching exercises are highly recommended. General Instructions:  Be alert for signs of possible infection: redness, swelling, heat, red streaks, elevated temperature, and/or fever. These typically appear 4 to 6 days after the procedure. Immediately notify your doctor if you experience unusual bleeding, difficulty breathing, or loss of bowel or bladder control. If you experience increased pain, do not increase your pain medicine intake, unless instructed by your pain physician. Post-Procedure Care:  Be careful in moving about. Muscle spasms in the area of the injection may occur. Applying ice or heat to the area is often helpful. The incidence of spinal headaches after epidural injections ranges between 1.4% and 6%. If you develop a headache that does not seem to respond to conservative therapy, please let your physician know. This can be treated with an epidural blood patch.   Post-procedure numbness or redness is to be expected, however it should average 4 to 6 hours. If numbness and weakness of your extremities begins to develop 4 to 6 hours after your procedure, and is felt to be progressing and worsening, immediately contact your physician.   Diet:  If you experience nausea, do not eat until this sensation goes away. If you had a "Stellate Ganglion Block" for upper extremity "Reflex Sympathetic Dystrophy", do not eat or drink until your   hoarseness goes away. In any [place], always start with liquids first and if you tolerate them well, then slowly progress to more solid foods. Activity:  For the first 4 to 6 hours after the procedure, use caution in moving about as you may experience numbness and/or weakness. Use caution in  cooking, using household electrical appliances, and climbing steps. If you need to reach your Doctor call our office: [phone] Monday-Thursday 8:00 am - 4:00 PM    Fridays: Closed     In [place] of an emergency: In [place] of emergency, call 911 or go to the nearest emergency room and have the physician there call us.  Interpretation of Procedure Every nerve block has two components: a diagnostic component, and a treatment component. Unrealistic expectations are the most common causes of "perceived failure".  In a perfect world, a single nerve block should be able to completely and permanently eliminate the pain. Sadly, the world is not perfect.  Most pain management nerve blocks are performed using local anesthetics and steroids. Steroids are responsible for any long-term benefit that you may experience. Their purpose is to decrease any chronic swelling that may exist in the area. Steroids begin to work immediately after being injected. However, most patients will not experience any benefits until 5 to 10 days after the injection, when the swelling has come down to the point where they can tell a difference. Steroids will only help if there is swelling to be treated. As such, they can assist with the diagnosis. If effective, they suggest an inflammatory component to the pain, and if ineffective, they rule out inflammation as the main cause or component of the problem. If the problem is one of mechanical compression, you will get no benefit from those steroids.   In the [place] of local anesthetics, they have a crucial role in the diagnosis of your condition. Most will begin to work within15 to 20 minutes after injection. The duration will depend on the type used (short- vs. Long-acting). It is of outmost importance that patients keep tract of their pain, after the procedure. To assist with this matter, a "Post-procedure Pain Diary" is provided. Make sure to complete it and to bring it back to your  follow-up appointment.  As long as the patient keeps accurate, detailed records of their symptoms after every procedure, and returns to have those interpreted, every procedure will provide us with invaluable information. Even a block that does not provide the patient with any relief, will always provide us with information about the mechanism and the origin of the pain. The only time a nerve block can be considered a waste of time is when patients do not keep track of the results, or do not keep their post-procedure appointment.  Reporting the results back to your physician The Pain Score  Pain is a subjective complaint. It cannot be seen, touched, or measured. We depend entirely on the patient's report of the pain in order to assess your condition and treatment. To evaluate the pain, we use a pain scale, where "0" means "No Pain", and a "10" is "the worst possible pain that you can even imagine" (i.e. something like been eaten alive by a shark or being torn apart by a lion).   You will frequently be asked to rate your pain. Please be as accurate, remember that medical decisions will be based on your responses. Please do not rate your pain above a 10. Doing so is actually interpreted as "symptom magnification" (exaggeration), as   well as lack of understanding with regards to the scale. To put this into perspective, when you tell us that your pain is at a 10 (ten), what you are saying is that there is nothing we can do to make this pain any worse. (Carefully think about that.) 

## 2018-10-02 NOTE — Progress Notes (Signed)
Safety precautions to be maintained throughout the outpatient stay will include: orient to surroundings, keep bed in low position, maintain call bell within reach at all times, provide assistance with transfer out of bed and ambulation.  

## 2018-10-02 NOTE — Progress Notes (Signed)
Patient's Name: [name] White  MRN: [id number]  Referring Provider: Gillis Santa, MD  DOB: 11-30-1965  PCP: Briscoe Deutscher, DO  DOS: 10/02/2018  Note by: Gillis Santa, MD  Service setting: Ambulatory outpatient  Specialty: Interventional Pain Management  Patient type: Established  Location: [facility] (AMB) Pain Management Facility  Visit type: Interventional Procedure   Primary Reason for Visit: Interventional Pain Management Treatment. CC: Back Pain (lumbar right )  Procedure:          Anesthesia, Analgesia, Anxiolysis:  Type: Diagnostic Sacroiliac Joint Steroid Injection #1  Region: Inferior Lumbosacral Region Level: PIIS (Posterior Inferior Iliac Spine) Laterality: Bilateral  Type: Moderate (Conscious) Sedation combined with Local Anesthesia Indication(s): Analgesia and Anxiety Route: Intravenous (IV) IV Access: Secured Sedation: Meaningful verbal contact was maintained at all times during the procedure  Local Anesthetic: Lidocaine 1-2%  Position: Prone           Indications: 1. SI joint arthritis    Pain Score: Pre-procedure: 8 /10 Post-procedure: 0-No pain/10   Pre-op Assessment:  [name] White is a 53 y.o. (year old), female patient, seen today for interventional treatment. 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 [name] White has a current medication list which includes the following prescription(s): acetaminophen, alprazolam, aripiprazole, duloxetine, gabapentin, ibuprofen, lamotrigine, linaclotide, metformin, nitroglycerin, ondansetron, pantoprazole, ropinirole, simvastatin, sumatriptan, topiramate, triamcinolone, and valacyclovir, and the following Facility-Administered Medications: fentanyl. Her primarily concern  today is the Back Pain (lumbar right )  Initial Vital Signs:  Pulse/HCG Rate: 76ECG Heart Rate: 73 Temp: 98.1 F (36.7 C) Resp: 16 BP: (!) 98/57 SpO2: 100 %  BMI: Estimated body mass index is 38.09 kg/m as calculated from the following:   Height as of this encounter: 5\' 6"  (1.676 m).   Weight as of this encounter: 236 lb (107 kg).  Risk Assessment: Allergies: Reviewed. She is allergic to morphine and penicillins.  Allergy Precautions: None required Coagulopathies: Reviewed. None identified.  Blood-thinner therapy: None at this time Active Infection(s): Reviewed. None identified. [name] White is afebrile  Site Confirmation: [name] White was asked to confirm the procedure and laterality before marking the site Procedure checklist: Completed Consent: Before the procedure and under the influence of no sedative(s), amnesic(s), or anxiolytics, the patient was informed of the treatment options, risks and possible complications. To fulfill our ethical and legal obligations, as recommended by the American Medical Association's Code of Ethics, I have informed the patient of my clinical impression; the nature and purpose of the treatment or procedure; the risks, benefits, and possible complications of the intervention; the alternatives, including doing nothing; the risk(s) and benefit(s) of the alternative treatment(s) or procedure(s); and the risk(s) and benefit(s) of doing nothing. The patient was provided information about the general risks and possible complications associated with the procedure. These may include, but are not limited to: failure to achieve desired goals, infection, bleeding, organ or nerve damage, allergic reactions, paralysis, and death. In addition, the patient was informed of those risks and complications associated to the procedure, such as failure to decrease pain; infection; bleeding; organ or nerve damage with subsequent damage to sensory, motor, and/or autonomic systems,  resulting in permanent pain, numbness, and/or weakness of one or several areas of the body; allergic reactions; (i.e.: anaphylactic reaction); and/or death. Furthermore, the patient was informed of those risks and complications  associated with the medications. These include, but are not limited to: allergic reactions (i.e.: anaphylactic or anaphylactoid reaction(s)); adrenal axis suppression; blood sugar elevation that in diabetics may result in ketoacidosis or comma; water retention that in patients with history of congestive heart failure may result in shortness of breath, pulmonary edema, and decompensation with resultant heart failure; weight gain; swelling or edema; medication-induced neural toxicity; particulate matter embolism and blood vessel occlusion with resultant organ, and/or nervous system infarction; and/or aseptic necrosis of one or more joints. Finally, the patient was informed that Medicine is not an exact science; therefore, there is also the possibility of unforeseen or unpredictable risks and/or possible complications that may result in a catastrophic outcome. The patient indicated having understood very clearly. We have given the patient no guarantees and we have made no promises. Enough time was given to the patient to ask questions, all of which were answered to the patient's satisfaction. Ms. [name] White has indicated that she wanted to continue with the procedure. Attestation: I, the ordering provider, attest that I have discussed with the patient the benefits, risks, side-effects, alternatives, likelihood of achieving goals, and potential problems during recovery for the procedure that I have provided informed consent. Date  Time: 10/02/2018 11:09 AM  Pre-Procedure Preparation:  Monitoring: As per clinic protocol. Respiration, ETCO2, SpO2, BP, heart rate and rhythm monitor placed and checked for adequate function Safety Precautions: Patient was assessed for positional comfort and pressure  points before starting the procedure. Time-out: I initiated and conducted the "Time-out" before starting the procedure, as per protocol. The patient was asked to participate by confirming the accuracy of the "Time Out" information. Verification of the correct person, site, and procedure were performed and confirmed by me, the nursing staff, and the patient. "Time-out" conducted as per Joint Commission's Universal Protocol (UP.01.01.01). Time: 1229  Description of Procedure:          Target Area: Inferior, posterior, aspect of the sacroiliac fissure Approach: Posterior, paraspinal, ipsilateral approach. Area Prepped: Entire Lower Lumbosacral Region Prepping solution: DuraPrep (Iodine Povacrylex [0.7% available iodine] and Isopropyl Alcohol, 74% w/w) Safety Precautions: Aspiration looking for blood return was conducted prior to all injections. At no point did we inject any substances, as a needle was being advanced. No attempts were made at seeking any paresthesias. Safe injection practices and needle disposal techniques used. Medications properly checked for expiration dates. SDV (single dose vial) medications used. Description of the Procedure: Protocol guidelines were followed. The patient was placed in position over the procedure table. The target area was identified and the area prepped in the usual manner. Skin & deeper tissues infiltrated with local anesthetic. Appropriate amount of time allowed to pass for local anesthetics to take effect. The procedure needle was advanced under fluoroscopic guidance into the sacroiliac joint until a firm endpoint was obtained. Proper needle placement secured. Negative aspiration confirmed. Solution injected in intermittent fashion, asking for systemic symptoms every 0.5cc of injectate. The needles were then removed and the area cleansed, making sure to leave some of the prepping solution back to take advantage of its long term bactericidal properties. Vitals:    10/02/18 1238 10/02/18 1247 10/02/18 1258 10/02/18 1308  BP: 113/67 (!) 116/59 112/62 117/65  Pulse:      Resp: 19 18 18  (!) 24  Temp:   97.6 F (36.4 C)   TempSrc:      SpO2: 96% 93% 95% 99%  Weight:      Height:  Start Time: 1229 hrs. End Time: 1239 hrs. Materials:  Needle(s) Type: Spinal Needle Gauge: 22G Length: 3.5-in Medication(s): Please see orders for medications and dosing details. 5 cc solution made of 4 cc of 0.2% ropivacaine, 1 cc of Decadron 10 mg/cc.  2.5 cc injected intra-articular, 2.5 cc injected periarticular for left SI joint 5 cc solution made of 4 cc of 0.2% ropivacaine, 1 cc of Decadron 10 mg/cc.  2.5 cc injected intra-articular, 2.5 cc injected periarticular for right SI joint Total steroid dose equals 20 mg of Decadron  Imaging Guidance (Non-Spinal):          Type of Imaging Technique: Fluoroscopy Guidance (Non-Spinal) Indication(s): Assistance in needle guidance and placement for procedures requiring needle placement in or near specific anatomical locations not easily accessible without such assistance. Exposure Time: Please see nurses notes. Contrast: Before injecting any contrast, we confirmed that the patient did not have an allergy to iodine, shellfish, or radiological contrast. Once satisfactory needle placement was completed at the desired level, radiological contrast was injected. Contrast injected under live fluoroscopy. No contrast complications. See chart for type and volume of contrast used. Fluoroscopic Guidance: I was personally present during the use of fluoroscopy. "Tunnel Vision Technique" used to obtain the best possible view of the target area. Parallax error corrected before commencing the procedure. "Direction-depth-direction" technique used to introduce the needle under continuous pulsed fluoroscopy. Once target was reached, antero-posterior, oblique, and lateral fluoroscopic projection used confirm needle placement in all planes. Images  permanently stored in EMR. Interpretation: I personally interpreted the imaging intraoperatively. Adequate needle placement confirmed in multiple planes. Appropriate spread of contrast into desired area was observed. No evidence of afferent or efferent intravascular uptake. Permanent images saved into the patient's record.  Antibiotic Prophylaxis:   Anti-infectives (From admission, onward)   None     Indication(s): None identified  Post-operative Assessment:  Post-procedure Vital Signs:  Pulse/HCG Rate: 7676 Temp: 97.6 F (36.4 C) Resp: (!) 24 BP: 117/65 SpO2: 99 %  EBL: None  Complications: No immediate post-treatment complications observed by team, or reported by patient.  Note: The patient tolerated the entire procedure well. A repeat set of vitals were taken after the procedure and the patient was kept under observation following institutional policy, for this type of procedure. Post-procedural neurological assessment was performed, showing return to baseline, prior to discharge. The patient was provided with post-procedure discharge instructions, including a section on how to identify potential problems. Should any problems arise concerning this procedure, the patient was given instructions to immediately contact us, at any time, without hesitation. In any [place], we plan to contact the patient by telephone for a follow-up status report regarding this interventional procedure.  Comments:  No additional relevant information.  Plan of Care  Orders:  Orders Placed This Encounter  Procedures  . DG PAIN [facility] C-ARM 1-60 MIN NO REPORT    Intraoperative interpretation by procedural physician at [place].    Standing Status:   Standing    Number of Occurrences:   1    Order Specific Question:   Reason for exam:    Answer:   Assistance in needle guidance and placement for procedures requiring needle placement in or near specific anatomical locations not easily accessible  without such assistance.   Medications ordered for procedure: Meds ordered this encounter  Medications  . iohexol (OMNIPAQUE) 180 MG/ML injection 10 mL    Must be Myelogram-compatible. If not available, you may substitute with a water-soluble, non-ionic, hypoallergenic,  myelogram-compatible radiological contrast medium.  Marland Kitchen lidocaine (XYLOCAINE) 2 % (with pres) injection 400 mg  . fentaNYL (SUBLIMAZE) injection 25-50 mcg    Make sure Narcan is available in the pyxis when using this medication. In the event of respiratory depression (RR< 8/min): Titrate NARCAN (naloxone) in increments of 0.1 to 0.2 mg IV at 2-3 minute intervals, until desired degree of reversal.  . dexamethasone (DECADRON) injection 10 mg  . dexamethasone (DECADRON) injection 10 mg  . ropivacaine (PF) 2 mg/mL (0.2%) (NAROPIN) injection 2 mL   Medications administered: We administered iohexol, lidocaine, fentaNYL, dexamethasone, dexamethasone, and ropivacaine (PF) 2 mg/mL (0.2%).  See the medical record for exact dosing, route, and time of administration.  Follow-up plan:   Return in about 6 weeks (around 11/13/2018) for Post Procedure Evaluation.      Interventional pain management only.  Status post bilateral sacroiliac joint injection on 10/02/2018.   Recent Visits Date Type Provider Dept  09/07/18 Office Visit Gillis Santa, MD [facility]  Showing recent visits within past 90 days and meeting all other requirements   Today's Visits Date Type Provider Dept  10/02/18 Procedure visit Gillis Santa, MD [facility]  Showing today's visits and meeting all other requirements   Future Appointments Date Type Provider Dept  11/13/18 Appointment Gillis Santa, MD [facility]  Showing future appointments within next 90 days and meeting all other requirements   Disposition: Discharge home  Discharge Date & Time: 10/02/2018;   hrs.   Primary Care Physician: Briscoe Deutscher, DO Location: [facility]  Outpatient Pain Management Facility Note by: Gillis Santa, MD Date: 10/02/2018; Time: 1:44 PM  Disclaimer:  Medicine is not an exact science. The only guarantee in medicine is that nothing is guaranteed. It is important to note that the decision to proceed with this intervention was based on the information collected from the patient. The Data and conclusions were drawn from the patient's questionnaire, the interview, and the physical examination. Because the information was provided in large part by the patient, it cannot be guaranteed that it has not been purposely or unconsciously manipulated. Every effort has been made to obtain as much relevant data as possible for this evaluation. It is important to note that the conclusions that lead to this procedure are derived in large part from the available data. Always take into account that the treatment will also be dependent on availability of resources and existing treatment guidelines, considered by other Pain Management Practitioners as being common knowledge and practice, at the time of the intervention. For Medico-Legal purposes, it is also important to point out that variation in procedural techniques and pharmacological choices are the acceptable norm. The indications, contraindications, technique, and results of the above procedure should only be interpreted and judged by a Board-Certified Interventional Pain Specialist with extensive familiarity and expertise in the same exact procedure and technique.

## 2018-10-03 ENCOUNTER — Encounter: Payer: Self-pay | Admitting: Family Medicine

## 2018-10-03 ENCOUNTER — Other Ambulatory Visit: Payer: Self-pay | Admitting: Family Medicine

## 2018-10-03 ENCOUNTER — Telehealth: Payer: Self-pay | Admitting: *Deleted

## 2018-10-03 DIAGNOSIS — E8881 Metabolic syndrome: Secondary | ICD-10-CM

## 2018-10-03 NOTE — Telephone Encounter (Signed)
Last fill 04/11/18  #180/1 Last oV 09/20/18

## 2018-10-03 NOTE — Telephone Encounter (Signed)
Attempted to call for post procedure follow-up. Message left. 

## 2018-10-05 ENCOUNTER — Other Ambulatory Visit: Payer: Self-pay

## 2018-10-05 DIAGNOSIS — E8881 Metabolic syndrome: Secondary | ICD-10-CM

## 2018-10-05 MED ORDER — BLOOD GLUCOSE MONITOR KIT: PACK | 0 refills | Status: DC

## 2018-10-12 ENCOUNTER — Other Ambulatory Visit: Payer: Self-pay | Admitting: Family Medicine

## 2018-10-20 ENCOUNTER — Other Ambulatory Visit: Payer: Self-pay | Admitting: Neurology

## 2018-10-20 NOTE — Telephone Encounter (Signed)
Requested Prescriptions   Pending Prescriptions Disp Refills  . SUMAtriptan (IMITREX) 100 MG tablet [Pharmacy Med Name: SUMATRIPTAN SUCC 100 MG TABLET] 9 tablet 3    Sig: TAKE 1 TABLET AT ONSET OF HEADACHE. MAY REPEAT IN 2 HOURS IF NEED. MAX 2 TABS IN 24 HOURS    Rx last filled: 06/19/18 #9 3 REFILLS  Pt last seen: 01/05/18   Follow up appt scheduled:NONE

## 2018-10-23 ENCOUNTER — Other Ambulatory Visit: Payer: Self-pay | Admitting: Family Medicine

## 2018-10-24 MED ORDER — RELION KETONE VI STRP: 1.0000 | ORAL_STRIP | 0 refills | Status: DC | PRN

## 2018-10-24 NOTE — Telephone Encounter (Signed)
Last fill for 1 kit 10/05/18  #1/0 Last OV 09/20/18

## 2018-10-31 ENCOUNTER — Telehealth: Payer: Self-pay | Admitting: Student in an Organized Health Care Education/Training Program

## 2018-10-31 ENCOUNTER — Ambulatory Visit (INDEPENDENT_AMBULATORY_CARE_PROVIDER_SITE_OTHER): Payer: PRIVATE HEALTH INSURANCE | Admitting: Psychiatry

## 2018-10-31 ENCOUNTER — Encounter (HOSPITAL_COMMUNITY): Payer: Self-pay | Admitting: Psychiatry

## 2018-10-31 ENCOUNTER — Other Ambulatory Visit: Payer: Self-pay

## 2018-10-31 DIAGNOSIS — F410 Panic disorder [episodic paroxysmal anxiety] without agoraphobia: Secondary | ICD-10-CM | POA: Diagnosis not present

## 2018-10-31 DIAGNOSIS — F4310 Post-traumatic stress disorder, unspecified: Secondary | ICD-10-CM | POA: Diagnosis not present

## 2018-10-31 DIAGNOSIS — F319 Bipolar disorder, unspecified: Secondary | ICD-10-CM | POA: Diagnosis not present

## 2018-10-31 MED ORDER — LAMOTRIGINE 150 MG PO TABS: 150.0000 mg | ORAL_TABLET | Freq: Every day | ORAL | 2 refills | Status: DC

## 2018-10-31 MED ORDER — GABAPENTIN 300 MG PO CAPS: 300.0000 mg | ORAL_CAPSULE | Freq: Three times a day (TID) | ORAL | 2 refills | Status: DC

## 2018-10-31 MED ORDER — DULOXETINE HCL 60 MG PO CPEP: 60.0000 mg | ORAL_CAPSULE | Freq: Every day | ORAL | 2 refills | Status: DC

## 2018-10-31 MED ORDER — ALPRAZOLAM 0.5 MG PO TABS: 0.5000 mg | ORAL_TABLET | Freq: Every evening | ORAL | 2 refills | Status: DC | PRN

## 2018-10-31 MED ORDER — ARIPIPRAZOLE 5 MG PO TABS: 5.0000 mg | ORAL_TABLET | Freq: Every day | ORAL | 2 refills | Status: DC

## 2018-10-31 MED ORDER — TRAZODONE HCL 50 MG PO TABS: 50.0000 mg | ORAL_TABLET | Freq: Every evening | ORAL | 1 refills | Status: DC | PRN
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
## 2018-10-31 NOTE — Telephone Encounter (Signed)
Dr. Holley Raring,                I cannot see where you have ever prescribed for her. Can you advise?

## 2018-10-31 NOTE — Telephone Encounter (Signed)
She has been out of pain meds since 10-27-18. Was wanting to know if she could get her meds refilled. Gabapentin and Hydromorphone. Please call and let patient know if this is possible. Her next appt 11-13-18 for follow up on injections.  [facility] [address]

## 2018-10-31 NOTE — Telephone Encounter (Signed)
I called the patient and told her the Gabapentin was called in. When she asked about the opoid refill I explained to her that you were not planning on prescribing the opioid (as discussed at her first visit) she states she does not remember you telling her that you were not going to prescribe. Anything further you need for me to tell her? She has a procedure  follow-up around the 14th.

## 2018-11-09 ENCOUNTER — Encounter: Payer: Self-pay | Admitting: Student in an Organized Health Care Education/Training Program

## 2018-11-13 ENCOUNTER — Other Ambulatory Visit: Payer: Self-pay | Admitting: Family Medicine

## 2018-11-13 ENCOUNTER — Ambulatory Visit
Payer: PRIVATE HEALTH INSURANCE | Attending: Student in an Organized Health Care Education/Training Program | Admitting: Student in an Organized Health Care Education/Training Program

## 2018-11-13 ENCOUNTER — Encounter: Payer: Self-pay | Admitting: Student in an Organized Health Care Education/Training Program

## 2018-11-13 ENCOUNTER — Other Ambulatory Visit: Payer: Self-pay

## 2018-11-13 DIAGNOSIS — M47818 Spondylosis without myelopathy or radiculopathy, sacral and sacrococcygeal region: Secondary | ICD-10-CM | POA: Diagnosis not present

## 2018-11-13 DIAGNOSIS — G5791 Unspecified mononeuropathy of right lower limb: Secondary | ICD-10-CM | POA: Diagnosis not present

## 2018-11-13 DIAGNOSIS — G8929 Other chronic pain: Secondary | ICD-10-CM

## 2018-11-13 DIAGNOSIS — M1611 Unilateral primary osteoarthritis, right hip: Secondary | ICD-10-CM

## 2018-11-13 DIAGNOSIS — G894 Chronic pain syndrome: Secondary | ICD-10-CM

## 2018-11-13 DIAGNOSIS — M25551 Pain in right hip: Secondary | ICD-10-CM

## 2018-11-13 NOTE — Progress Notes (Signed)
Pain Management Virtual Encounter Note - Virtual Visit via Telephone Telehealth (real-time audio visits between healthcare provider and patient).   Patient's Phone No. & Preferred Pharmacy:  [phone] (home); [phone] (mobile); (Preferred) [phone] [email or web address]  [id number] [place] [address]: [phone]: [phone] [place] [address] [place] [address]Phone: [phone]: [phone]   Pre-screening note:  Our staff contacted [name] White and offered her an "in person", "face-to-face" appointment versus a telephone encounter. She indicated preferring the telephone encounter, at this time.   Reason for Virtual Visit: COVID-19*  Social distancing based on CDC and AMA recommendations.   I contacted [name] White on 11/13/2018 via telephone.      I clearly identified myself as BGillis Santa MD. I verified that I was speaking with the correct person using two identifiers (Name: [name] White, and date of birth: 8October 07, 1967.  Advanced Informed Consent I sought verbal advanced consent from [name] White for virtual visit interactions. I informed [name] White of possible security and privacy concerns, risks, and limitations associated with providing "not-in-person" medical evaluation and management services. I also informed [name] White of the availability of "in-person" appointments. Finally, I informed her that there would be a charge for the virtual visit and that she could be  personally, fully or partially, financially responsible for it. [name] White expressed understanding and agreed to proceed.   Historic Elements   [name] White is a 53y.o. year old, female patient evaluated today after her last encounter by our practice on 10/31/2018. [name] White  has a past medical history of Anterolisthesis,  Anxiety, Arthritis, Arthritis of right hip, Barrett's esophagus, Chronic headache, Colon polyp, Depression, Grief reaction, Hidradenitis suppurativa, IBS (irritable bowel syndrome), Migraine, OSA (obstructive sleep apnea), Ovarian cyst, Restless leg, and Tobacco abuse. She also  has a past surgical history that includes Wrist surgery; Cesarean section; Cholecystectomy; Tubal ligation; plantar fascitis; Abdominal hysterectomy; laparoscopy (04-22-10); Elbow surgery; Salpingectomy (04-22-10); laparoscopy (04-30-10 ); Colonoscopy w/ polypectomy (2012); and Total hip arthroplasty (Right, 04/25/2017). [name] White has a current medication list which includes the following prescription(s): acetaminophen, relion ketone, alprazolam, aripiprazole, blood glucose meter kit and supplies, bupropion, diclofenac sodium, duloxetine, gabapentin, ibuprofen, lamotrigine, linaclotide, metformin, nitroglycerin, ondansetron, pantoprazole, ropinirole, simvastatin, sumatriptan, topiramate, trazodone, triamcinolone, and valacyclovir. She  reports that she quit smoking about 3 years ago. Her smoking use included cigarettes. She has a 15.00 pack-year smoking history. She has never used smokeless tobacco. She reports current alcohol use. She reports that she does not use drugs. [name] White is allergic to morphine and penicillins.   HPI  Today, she is being contacted for a post-procedure assessment.  Evaluation of last interventional procedure  10/31/2018 Procedure: Bilateral sacroiliac joint injection  Influential Factors: Intra-procedural challenges: None observed.         Reported side-effects: None.        Post-procedural adverse reactions or complications: None reported         Sedation: Please see nurses note for DOS. When no sedatives are used, the analgesic levels obtained are directly associated to the effectiveness of the local anesthetics. However, when sedation is provided, the level of analgesia obtained during the initial 1 hour  following the intervention, is believed to be the result of a combination of factors. These factors may include, but are not limited to: 1.  The effectiveness of the local anesthetics used. 2. The effects of the analgesic(s) and/or anxiolytic(s) used. 3. The degree of discomfort experienced by the patient at the time of the procedure. 4. The patients ability and reliability in recalling and recording the events. 5. The presence and influence of possible secondary gains and/or psychosocial factors. Reported result: Relief experienced during the 1st hour after the procedure: 100 % (Ultra-Short Term Relief)            Interpretative annotation: Clinically appropriate result. Analgesia during this period is likely to be Local Anesthetic and/or IV Sedative (Analgesic/Anxiolytic) related.          Effects of local anesthetic: The analgesic effects attained during this period are directly associated to the localized infiltration of local anesthetics and therefore cary significant diagnostic value as to the etiological location, or anatomical origin, of the pain. Expected duration of relief is directly dependent on the pharmacodynamics of the local anesthetic used. Long-acting (4-6 hours) anesthetics used.  Reported result: Relief during the next 4 to 6 hour after the procedure: 100 % (Short-Term Relief)            Interpretative annotation: Clinically appropriate result. Analgesia during this period is likely to be Local Anesthetic-related.          Long-term benefit: Defined as the period of time past the expected duration of local anesthetics (1 hour for short-acting and 4-6 hours for long-acting). With the possible exception of prolonged sympathetic blockade from the local anesthetics, benefits during this period are typically attributed to, or associated with, other factors such as analgesic sensory neuropraxia, antiinflammatory effects, or beneficial biochemical changes provided by agents other than the  local anesthetics.  Reported result: Extended relief following procedure: 100 %(good pain relief for about 1 1/2 weeks. left side is better.  right side was not helped at all.) (Long-Term Relief)            Interpretative annotation: Clinically appropriate result. Good relief. No permanent benefit expected. Inflammation plays a part in the etiology to the pain.          Laboratory Chemistry Profile (12 mo)  Renal: 04/13/2018: BUN 16; Creatinine, Ser 1.01  Lab Results  Component Value Date   GFR 57.23 (L) 04/13/2018   GFRAA >60 04/26/2017   GFRNONAA >60 04/26/2017   Hepatic: 04/13/2018: Albumin 4.0 Lab Results  Component Value Date   AST 18 04/13/2018   ALT 15 04/13/2018   Other: No results found for requested labs within last 8760 hours. Note: Above Lab results reviewed.  Imaging  Last 90 days:  [facility] C-arm 1-60 Min No Report  Result Date: 10/02/2018 Fluoro was used, but no Radiologist interpretation will be provided. Please refer to "NOTES" tab for provider progress note.   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  Plan of Care  I am having [name] Amburn. Awbrey "[name]" maintain her linaclotide, nitroGLYCERIN, ondansetron, simvastatin, topiramate, triamcinolone, valACYclovir, pantoprazole, acetaminophen, ibuprofen, metFORMIN, blood glucose meter kit and supplies, rOPINIRole, SUMAtriptan, ReliOn Ketone, gabapentin, DULoxetine, ARIPiprazole, ALPRAZolam, lamoTRIgine, traZODone, buPROPion, and diclofenac sodium.  Patient follows up today status post bilateral sacroiliac joint injection performed on 10/02/2018.  She states that her left side is doing much better  in regards to pain and  spasms.  She is continued to have pain on her right side that radiates to her right hip as well.  We discussed repeating right SI joint injection.  Patient states that she would like to think about this further.  Patient is requesting opioid analgesics.  Patient is under the care of Dr. Adele Schilder with psychiatry.  I made it very clear with the patient that she would not be a candidate for chronic opioid therapy at this clinic given her psychiatric history, history of bipolar disorder and her concurrent use of Xanax in the context of morbid obesity.  I informed her that opioid medications could increase her risk of respiratory depression in the context of her sedative medications and symptoms concerning for sleep apnea.  Patient was previously seen by [facility] where she had lumbar facet radiofrequency ablation done at L3-S1 as well as hip bursa injections which she states were not very helpful.  We will place order for as needed SI joint injection.  Patient instructed to call us if and when she would like to repeat.  Orders:  Orders Placed This Encounter  Procedures  . SACROILIAC JOINT INJECTION    For low back pain and groin pain.    Standing Status:   Standing    Number of Occurrences:   1    Standing Expiration Date:   11/13/2019    Scheduling Instructions:     Side: Bilateral     Sedation: Patient's choice.     TIMEFRAME: PRN procedure. (Ms. Faciane will call when needed.)    Order Specific Question:   Where will this procedure be performed?    Answer:   [facility]   Follow-up plan:   Return if symptoms worsen or fail to improve.     Interventional pain management only.  Status post bilateral sacroiliac joint injection on 10/02/2018-left side helped, right side did not.  Can repeat right PRN.Marland Kitchen    Recent Visits Date Type Provider Dept  10/02/18 Procedure visit Gillis Santa, MD [facility]  09/07/18 Office Visit Gillis Santa, MD [facility]  Showing recent  visits within past 90 days and meeting all other requirements   Today's Visits Date Type Provider Dept  11/13/18 Office Visit Gillis Santa, MD [facility]  Showing today's visits and meeting all other requirements   Future Appointments No visits were found meeting these conditions.  Showing future appointments within next 90 days and meeting all other requirements   I discussed the assessment and treatment plan with the patient. The patient was provided an opportunity to ask questions and all were answered. The patient agreed with the plan and demonstrated an understanding of the instructions.  Patient advised to call back or seek an in-person evaluation if the symptoms or condition worsens.  Total duration of non-face-to-face encounter: 21 minutes.  Note by: Gillis Santa, MD Date: 11/13/2018; Time: 2:45 PM  Note: This dictation was prepared with Dragon dictation. Any transcriptional errors that may result from this process are unintentional.  Disclaimer:  * Given the special circumstances of the COVID-19 pandemic, the federal government has announced that the Office for Civil Rights (OCR) will exercise its enforcement discretion and will not impose penalties on physicians using telehealth in the event of noncompliance with regulatory requirements under the [place] (HIPAA) in connection with the good faith provision of telehealth during the ZJIRC-78 national public health emergency. ([place])

## 2018-11-17 ENCOUNTER — Other Ambulatory Visit: Payer: Self-pay

## 2018-11-17 ENCOUNTER — Encounter: Payer: Self-pay | Admitting: Physical Medicine & Rehabilitation

## 2018-11-17 ENCOUNTER — Encounter
Payer: PRIVATE HEALTH INSURANCE | Attending: Physical Medicine & Rehabilitation | Admitting: Physical Medicine & Rehabilitation

## 2018-11-17 VITALS — BP 95/67 | HR 75 | Temp 97.5°F | Ht 66.0 in | Wt 243.0 lb

## 2018-11-17 DIAGNOSIS — G588 Other specified mononeuropathies: Secondary | ICD-10-CM | POA: Diagnosis not present

## 2018-11-17 DIAGNOSIS — M47816 Spondylosis without myelopathy or radiculopathy, lumbar region: Secondary | ICD-10-CM | POA: Diagnosis not present

## 2018-11-17 MED ORDER — GABAPENTIN 400 MG PO CAPS: 400.0000 mg | ORAL_CAPSULE | Freq: Three times a day (TID) | ORAL | 1 refills | Status: DC

## 2018-11-17 NOTE — Progress Notes (Signed)
Subjective:    Patient ID: [name] White, female    DOB: 08-23-1965, 53 y.o.   MRN: [id number]  HPI  53 year old female with history of right groin pain following pelvic surgery in 2012.  She was followed in this clinic from 2016 through 2019. She had hip x-rays that showed mild osteoarthritis of both hips.  She had hypersensitivity to touch over the skin of her inguinal area on the right side only. Underwent Right iliohypogastric cryo x 3 2016, 2017 and 2018.  THe first time lasted 12mo or more  last time it lasted only 2wks.   Because the patient had only short-term relief with cryoablation of this nerve she was referred to [facility] to be evaluated for peripheral nerve stimulation.  The patient saw Dr. [name] and underwent sacroiliac injections which helped on the left side only, also underwent medial branch blocks L3-L4-L5 which helped with the low back pain and had radiofrequency ablation in 2019 as well.  The patient also underwent bilateral trochanteric bursa injections under fluoroscopic guidance which were not of much benefit.  [facility] RIght L3-4-5 MBB and RFA which was helpful  Sacroiliac injection worked on left side but did not help with Right sided pain  RIght hip an dgroin is the worst pain Pain bending forward and pain with picking up the Right leg  Right hip THA March 2019 per Dr Lyla Glassing, I recommended intra-articular injection prior to that time but not clear whether this was performed.  Patient did not recall this.  The patient received hydromorphone 2 mg every 8 hours as well as Lyrica 50 3 times daily at [facility]. She had an occasion where patient fell onto her knees and was late to work on several occasions due to oversleeping.  She has been on benzodiazepines in combination with narcotic analgesics at that time.  Her gabapentin dose was 900 mg/day Patient was then seen at [facility]  The patient also sees Dr. Tomi Likens from  neurology for migraines.  She has been treated with topiramate 200 mg nightly.  This makes her tired.  Patient has also been referred to [facility] pain management Dr. Coralie Keens.  An MRI of the lumbar spine dated 06/29/2017 showed mild bilateral neuroforaminal stenosis L5-S1, evidence of moderate facet arthrosis L4-5 and mild facet arthrosis L5-S1  The patient saw another orthopedic surgeon 05/19/2018, this was for insurance reasons  Her other medication management includes gabapentin 300 mg 3 times daily which does help and mainly notes when she does not take it  Repeat MRI right hip 08/06/2018 showed no abnormalities.  Right hip endoprosthesis well-placed  Repeat MRI of the lumbar spine on 07/16/2018 showed lumbar facet arthrosis L4-5 greater than L3-4 greater than L5-S1  Laying down ok but when turning her back pain bothers her. Her groin pain does get exacerbated with flexion of the right hip. Pain Inventory Average Pain 7 Pain Right Now 7 My pain is constant, dull, stabbing, tingling and aching  In the last 24 hours, has pain interfered with the following? General activity 10 Relation with others 9 Enjoyment of life 10 What TIME of day is your pain at its worst? varies Sleep (in general) Fair  Pain is worse with: walking, bending, sitting, standing and some activites Pain improves with: rest, heat/ice and medication Relief from Meds: 8  Mobility walk with assistance use a cane how many minutes can you walk? 2 ability to climb steps?  no do you drive?  yes  Function not employed: date last employed 04/2008 I need assistance with the following:  dressing, household duties and shopping  Neuro/Psych weakness numbness tingling trouble walking spasms depression anxiety  Prior Studies Any changes since last visit?  yes CT/MRI  Physicians involved in your care Primary care Dr. Wendie Simmer Psychiatrist Dr. Mia Creek Orthopedist Dr. Adele Schilder   Family History  Problem  Relation Age of Onset  . COPD Mother   . Stroke Mother   . Coronary artery disease Mother        pacer  . Colon polyps Mother   . Breast cancer Mother   . Migraines Mother   . Other Mother        RSDS  . Rheum arthritis Mother   . Hypertension Father   . Colon polyps Brother   . Colon cancer Maternal Grandfather   . Colon polyps Maternal Grandfather   . Breast cancer Maternal Aunt   . Uterine cancer Cousin        x 2  . Aneurysm Maternal Grandmother   . Kidney disease Brother   . Other Brother        back pain  . Kidney cancer Paternal Aunt   . Stomach cancer Neg Hx    Social History   Socioeconomic History  . Marital status: Divorced    Spouse name: Not on file  . Number of children: 2  . Years of education: Not on file  . Highest education level: Not on file  Occupational History  . Occupation: Chief Operating Officer  Social Needs  . Financial resource strain: Not very hard  . Food insecurity    Worry: Never true    Inability: Never true  . Transportation needs    Medical: No    Non-medical: No  Tobacco Use  . Smoking status: Former Smoker    Packs/day: 0.50    Years: 30.00    Pack years: 15.00    Types: Cigarettes    Quit date: 06/21/2015    Years since quitting: 3.4  . Smokeless tobacco: Never Used  Substance and Sexual Activity  . Alcohol use: Yes    Alcohol/week: 0.0 standard drinks    Comment: 2 x a month  . Drug use: No  . Sexual activity: Not on file  Lifestyle  . Physical activity    Days per week: 0 days    Minutes per session: 0 min  . Stress: Very much  Relationships  . Social connections    Talks on phone: More than three times a week    Gets together: More than three times a week    Attends religious service: Never    Active member of club or organization: No    Attends meetings of clubs or organizations: Never    Relationship status: Divorced  Other Topics Concern  . Not on file  Social History Narrative   Occupation: bartender   Divorced   -- currently single   Alcohol use-yes -x 5 per weekend   No longer a smoker   Past Surgical History:  Procedure Laterality Date  . ABDOMINAL HYSTERECTOMY     secondary to endometriosis  . CESAREAN SECTION     x 2  . CHOLECYSTECTOMY    . COLONOSCOPY W/ POLYPECTOMY  2012  . ELBOW SURGERY     left  . LAPAROSCOPY  04-22-10   lysis of adhesions  . LAPAROSCOPY  04-30-10    with mini laparotomy and cystotomy repair  . plantar fascitis  right foot  . SALPINGECTOMY  04-22-10   right  . TOTAL HIP ARTHROPLASTY Right 04/25/2017   Procedure: RIGHT TOTAL HIP ARTHROPLASTY ANTERIOR APPROACH;  Surgeon: Rod Can, MD;  Location: [place];  Service: Orthopedics;  Laterality: Right;  Needs RNFA  . TUBAL LIGATION    . WRIST SURGERY     right/ganglion cyst   Past Medical History:  Diagnosis Date  . Anterolisthesis   . Anxiety    per pt, has panic attacks  . Arthritis    in back per pt.  . Arthritis of right hip   . Barrett's esophagus    egd 2007 + 2009 but none 2011 or 08/2010 egd  . Chronic headache   . Colon polyp    hyperplastic  . Depression    hx of suicide attempt in 2003  . Grief reaction   . Hidradenitis suppurativa   . IBS (irritable bowel syndrome)   . Migraine   . OSA (obstructive sleep apnea)   . Ovarian cyst   . Restless leg   . Tobacco abuse    BP 95/67   Pulse 75   Temp (!) 97.5 F (36.4 C)   Ht 5\' 6"  (1.676 m)   Wt 243 lb (110.2 kg)   LMP  (LMP Unknown)   SpO2 99%   BMI 39.22 kg/m   Opioid Risk Score:   Fall Risk Score:  `1  Depression screen PHQ 2/9  Depression screen [facility] 2/9 11/17/2018 09/20/2018 04/11/2018 09/02/2017 02/08/2017 01/17/2017 11/22/2016  Decreased Interest 1 3 3 3 3 1 2   Down, Depressed, Hopeless 1 1 3 2 3 1 2   PHQ - 2 Score 2 4 6 5 6 2 4   Altered sleeping 1 3 3 3 3  - 3  Tired, decreased energy 1 3 3 3 3  - 3  Change in appetite 1 3 3 3 3  - 3  Feeling bad or failure about yourself  0 1 1 1 3  - 0  Trouble concentrating 0 2 2 2 2  - 0   Moving slowly or fidgety/restless 1 3 2 2  0 - 0  Suicidal thoughts 0 0 1 0 0 - 0  PHQ-9 Score 6 19 21 19 20  - 13  Difficult doing work/chores Extremely dIfficult Very difficult Very difficult Very difficult Somewhat difficult - Somewhat difficult  Some recent data might be hidden    Review of Systems  Constitutional: Positive for unexpected weight change.  HENT: Negative.   Eyes: Negative.   Respiratory: Negative.   Cardiovascular: Negative.   Gastrointestinal: Negative.   Endocrine: Negative.   Genitourinary: Negative.   Musculoskeletal: Positive for gait problem.  Skin: Negative.   Allergic/Immunologic: Negative.   Neurological: Positive for weakness and numbness.  Hematological: Negative.   Psychiatric/Behavioral: Positive for dysphoric mood. The patient is nervous/anxious.   All other systems reviewed and are negative.      Objective:   Physical Exam  Patient has tenderness with light palpation in the right inguinal area but not on the left side.  She also has mild hypersensitivity over the right lateral thigh compared to the left lateral thigh. Her motor strength is 4- at the right hip flexor she is 5 at the right knee extensor 5 at the ankle dorsiflexor 5 in the left hip flexor knee extensor ankle dorsiflexor. Her lumbar spine has mild tenderness palpation lumbar paraspinals around L4-5 on the right side.  No pain tenderness over the PSIS area bilaterally.  No tenderness of the greater trochanteric  area Ambulates without assistive device no evidence of toe drag or knee instability. Negative straight leg raising test       Assessment & Plan:  1.  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  Had short-term relief with iliohypogastric cryoablation.  The incidence of dysesthesias with thermocoagulation make this a less attractive alternative.  She may benefit from peripheral nerve stimulation however when referred to [facility] this was not  performed.  Will see if there is any other providers in this area that may be able to perform that procedure. In the meantime will increase gabapentin to 400 3 times daily 2.  Lumbar facet arthropathy had good relief with radiofrequency at L3 and L4 medial branches as well as L5 dorsal ramus performed approximately 1 year ago.  Will schedule for repeat. We discussed based on her history and her other medications would not feel comfortable prescribing narcotic analgesic on a chronic basis.

## 2018-11-17 NOTE — Patient Instructions (Signed)
Will do Right L3-4-5- RF to help with Right sided low back pain that wakes you up  Will increase Gabapentin to help with nerve pain Right groin.  We may need to adjust the dose further or even try another medication

## 2018-11-22 ENCOUNTER — Other Ambulatory Visit (HOSPITAL_COMMUNITY): Payer: Self-pay | Admitting: Psychiatry

## 2018-11-22 DIAGNOSIS — F4310 Post-traumatic stress disorder, unspecified: Secondary | ICD-10-CM

## 2018-11-26 ENCOUNTER — Encounter: Payer: Self-pay | Admitting: Family Medicine

## 2018-12-08 ENCOUNTER — Other Ambulatory Visit: Payer: Self-pay | Admitting: Family Medicine

## 2018-12-15 ENCOUNTER — Encounter: Payer: Self-pay | Admitting: Family Medicine

## 2018-12-16 ENCOUNTER — Other Ambulatory Visit: Payer: Self-pay | Admitting: Neurology

## 2018-12-18 ENCOUNTER — Other Ambulatory Visit: Payer: Self-pay

## 2018-12-18 ENCOUNTER — Encounter: Payer: PRIVATE HEALTH INSURANCE | Admitting: Physical Medicine & Rehabilitation

## 2018-12-18 NOTE — Telephone Encounter (Signed)
Requested Prescriptions   Pending Prescriptions Disp Refills  . topiramate (TOPAMAX) 200 MG tablet [Pharmacy Med Name: TOPIRAMATE 200 MG TABLET] 90 tablet 1    Sig: TAKE 1 TABLET (200 MG TOTAL) BY MOUTH AT BEDTIME.   Rx last filled:04/13/18 #90 1 REFILLS  Pt last seen:01/05/18  Follow up appt scheduled:NONE

## 2018-12-27 ENCOUNTER — Other Ambulatory Visit (HOSPITAL_COMMUNITY): Payer: Self-pay | Admitting: Psychiatry

## 2018-12-27 DIAGNOSIS — F4310 Post-traumatic stress disorder, unspecified: Secondary | ICD-10-CM

## 2019-01-01 ENCOUNTER — Other Ambulatory Visit: Payer: Self-pay | Admitting: Family Medicine

## 2019-01-01 DIAGNOSIS — E7800 Pure hypercholesterolemia, unspecified: Secondary | ICD-10-CM

## 2019-01-01 NOTE — Telephone Encounter (Signed)
Patient is scheduled for Dec 2 at 2:20 with Sam.

## 2019-01-01 NOTE — Telephone Encounter (Signed)
Call to make [facility] let me know when done so I can send in refill.

## 2019-01-10 ENCOUNTER — Other Ambulatory Visit (HOSPITAL_COMMUNITY): Payer: Self-pay | Admitting: Psychiatry

## 2019-01-10 DIAGNOSIS — F4310 Post-traumatic stress disorder, unspecified: Secondary | ICD-10-CM

## 2019-01-15 ENCOUNTER — Other Ambulatory Visit (HOSPITAL_COMMUNITY): Payer: Self-pay | Admitting: Psychiatry

## 2019-01-15 ENCOUNTER — Other Ambulatory Visit (HOSPITAL_COMMUNITY): Payer: Self-pay | Admitting: *Deleted

## 2019-01-15 DIAGNOSIS — F4310 Post-traumatic stress disorder, unspecified: Secondary | ICD-10-CM

## 2019-01-15 MED ORDER — TRAZODONE HCL 50 MG PO TABS: 50.0000 mg | ORAL_TABLET | Freq: Every evening | ORAL | 0 refills | Status: DC | PRN

## 2019-01-16 ENCOUNTER — Other Ambulatory Visit: Payer: Self-pay | Admitting: Family Medicine

## 2019-01-17 NOTE — Telephone Encounter (Signed)
Ok to fill has app to transfer to you soon.

## 2019-01-24 ENCOUNTER — Encounter: Payer: PRIVATE HEALTH INSURANCE | Admitting: Physician Assistant

## 2019-01-30 ENCOUNTER — Ambulatory Visit (INDEPENDENT_AMBULATORY_CARE_PROVIDER_SITE_OTHER): Payer: PRIVATE HEALTH INSURANCE | Admitting: Psychiatry

## 2019-01-30 ENCOUNTER — Encounter (HOSPITAL_COMMUNITY): Payer: Self-pay | Admitting: Psychiatry

## 2019-01-30 ENCOUNTER — Other Ambulatory Visit: Payer: Self-pay

## 2019-01-30 DIAGNOSIS — F4310 Post-traumatic stress disorder, unspecified: Secondary | ICD-10-CM

## 2019-01-30 DIAGNOSIS — F319 Bipolar disorder, unspecified: Secondary | ICD-10-CM

## 2019-01-30 DIAGNOSIS — F410 Panic disorder [episodic paroxysmal anxiety] without agoraphobia: Secondary | ICD-10-CM

## 2019-01-30 MED ORDER — ARIPIPRAZOLE 5 MG PO TABS: 5.0000 mg | ORAL_TABLET | Freq: Every day | ORAL | 2 refills | Status: DC

## 2019-01-30 MED ORDER — DULOXETINE HCL 60 MG PO CPEP: 60.0000 mg | ORAL_CAPSULE | Freq: Every day | ORAL | 2 refills | Status: DC

## 2019-01-30 MED ORDER — LAMOTRIGINE 150 MG PO TABS: 150.0000 mg | ORAL_TABLET | Freq: Every day | ORAL | 2 refills | Status: DC

## 2019-01-30 MED ORDER — TRAZODONE HCL 50 MG PO TABS: 50.0000 mg | ORAL_TABLET | Freq: Every evening | ORAL | 2 refills | Status: DC | PRN

## 2019-01-30 MED ORDER — ALPRAZOLAM 0.5 MG PO TABS: 0.5000 mg | ORAL_TABLET | Freq: Every evening | ORAL | 2 refills | Status: DC | PRN
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
## 2019-02-02 ENCOUNTER — Other Ambulatory Visit: Payer: Self-pay | Admitting: Neurology

## 2019-02-02 MED ORDER — TOPIRAMATE 200 MG PO TABS: 200.0000 mg | ORAL_TABLET | Freq: Every day | ORAL | 0 refills | Status: DC

## 2019-02-02 NOTE — Telephone Encounter (Signed)
Patient left msg with after hours about needing a refill on her topiramate medication for migraines. Thanks!

## 2019-02-02 NOTE — Telephone Encounter (Signed)
Topiramate rx sent to pharmacy.  Patient needs follow up appointment.  Mychart message sent.

## 2019-02-09 ENCOUNTER — Other Ambulatory Visit: Payer: Self-pay | Admitting: Family Medicine

## 2019-02-09 ENCOUNTER — Other Ambulatory Visit: Payer: Self-pay | Admitting: Physician Assistant

## 2019-02-09 DIAGNOSIS — F332 Major depressive disorder, recurrent severe without psychotic features: Secondary | ICD-10-CM

## 2019-02-09 NOTE — Telephone Encounter (Signed)
Pt requesting refill. Scheduled to see you 02/28/2019.

## 2019-02-10 ENCOUNTER — Other Ambulatory Visit: Payer: Self-pay | Admitting: Physical Medicine & Rehabilitation

## 2019-02-27 ENCOUNTER — Other Ambulatory Visit: Payer: Self-pay

## 2019-02-28 ENCOUNTER — Encounter: Payer: Self-pay | Admitting: Physician Assistant

## 2019-02-28 ENCOUNTER — Ambulatory Visit (INDEPENDENT_AMBULATORY_CARE_PROVIDER_SITE_OTHER): Payer: PRIVATE HEALTH INSURANCE | Admitting: Physician Assistant

## 2019-02-28 VITALS — BP 110/70 | HR 93 | Temp 97.2°F | Ht 66.0 in | Wt 245.0 lb

## 2019-02-28 DIAGNOSIS — F319 Bipolar disorder, unspecified: Secondary | ICD-10-CM | POA: Insufficient documentation

## 2019-02-28 DIAGNOSIS — R110 Nausea: Secondary | ICD-10-CM

## 2019-02-28 DIAGNOSIS — G8929 Other chronic pain: Secondary | ICD-10-CM

## 2019-02-28 DIAGNOSIS — Z23 Encounter for immunization: Secondary | ICD-10-CM

## 2019-02-28 DIAGNOSIS — E8881 Metabolic syndrome: Secondary | ICD-10-CM

## 2019-02-28 DIAGNOSIS — F4310 Post-traumatic stress disorder, unspecified: Secondary | ICD-10-CM | POA: Insufficient documentation

## 2019-02-28 DIAGNOSIS — R519 Headache, unspecified: Secondary | ICD-10-CM

## 2019-02-28 DIAGNOSIS — K582 Mixed irritable bowel syndrome: Secondary | ICD-10-CM

## 2019-02-28 LAB — COMPREHENSIVE METABOLIC PANEL
ALT: 21 U/L (ref 0–35)
AST: 19 U/L (ref 0–37)
Albumin: 4.4 g/dL (ref 3.5–5.2)
Alkaline Phosphatase: 83 U/L (ref 39–117)
BUN: 11 mg/dL (ref 6–23)
CO2: 29 mEq/L (ref 19–32)
Calcium: 9.6 mg/dL (ref 8.4–10.5)
Chloride: 106 mEq/L (ref 96–112)
Creatinine, Ser: 0.87 mg/dL (ref 0.40–1.20)
GFR: 67.76 mL/min (ref >60.00)
Glucose, Bld: 83 mg/dL (ref 70–99)
Potassium: 4 mEq/L (ref 3.5–5.1)
Sodium: 140 mEq/L (ref 135–145)
Total Bilirubin: 0.3 mg/dL (ref 0.2–1.2)
Total Protein: 7.2 g/dL (ref 6.0–8.3)

## 2019-02-28 LAB — CBC WITH DIFFERENTIAL/PLATELET
Basophils Absolute: 0.1 10*3/uL (ref 0.0–0.1)
Basophils Relative: 0.8 % (ref 0.0–3.0)
Eosinophils Absolute: 0.2 10*3/uL (ref 0.0–0.7)
Eosinophils Relative: 2.2 % (ref 0.0–5.0)
HCT: 43.8 % (ref 36.0–46.0)
Hemoglobin: 14.3 g/dL (ref 12.0–15.0)
Lymphocytes Relative: 32.6 % (ref 12.0–46.0)
Lymphs Abs: 2.4 10*3/uL (ref 0.7–4.0)
MCHC: 32.5 g/dL (ref 30.0–36.0)
MCV: 99.6 fl (ref 78.0–100.0)
Monocytes Absolute: 0.9 10*3/uL (ref 0.1–1.0)
Monocytes Relative: 12.2 % — ABNORMAL HIGH (ref 3.0–12.0)
Neutro Abs: 3.8 10*3/uL (ref 1.4–7.7)
Neutrophils Relative %: 52.2 % (ref 43.0–77.0)
Platelets: 242 10*3/uL (ref 150.0–400.0)
RBC: 4.4 Mil/uL (ref 3.87–5.11)
RDW: 13.8 % (ref 11.5–15.5)
WBC: 7.2 10*3/uL (ref 4.0–10.5)

## 2019-02-28 LAB — HEMOGLOBIN A1C: Hgb A1c MFr Bld: 5.9 % (ref 4.6–6.5)

## 2019-02-28 NOTE — Patient Instructions (Signed)
It was great to see you!  Consider taking probiotic.  Please call Dr. Woodward Ku office to follow-up on your diarrhea [phone]  Please discuss headaches with Dr. Tomi Likens  I do recommend that you consider checking your blood sugars when you are having nausea to see if they are elevated or low  Let's follow-up in 3 months, after you see J[facility] and Nandigam, sooner if you have concerns.  Take care,  Inda Coke PA-C

## 2019-02-28 NOTE — Progress Notes (Signed)
[name] White is a 54 y.o. female is here for transfer of care.  I acted as a Education administrator for [employer], PA-C Anselmo Pickler, LPN  History of Present Illness:   Chief Complaint  Patient presents with  . Transfer of care  . Headache  . Nausea and diarrhea    HPI   Pt is here for transfer of care from Dr. Juleen China.  Nausea and diarrhea Pt is c/o increase in headaches for the past month along with nausea and diarrhea. She has longstanding GI issues with thorough and unrevealing work-up by Dr. Silverio Decamp, last seen on 03/17/17. She reports chronic constipation alternating with diarrhea. She doesn't know if this is her IBS or if she has some issues with her blood sugar. Goes long periods of time (up to 8 hours) during the day without eating anything. Was without her metformin for a bit due to loss of insurance, so she hasn't taken it in several months, denies any known issues with this when she was taking it.  Headaches Followed by Dr. Tomi Likens with [facility] and has appointment scheduled next month to see him. Denies any changes to her headaches. Takes 200 mg topamax for her headaches and doesn't find this too sedating. Has tried lower doses of topamax but hasn't had good results with headache relief.    Health Maintenance Due  Topic Date Due  . MAMMOGRAM  06/19/2018    Past Medical History:  Diagnosis Date  . Anterolisthesis   . Anxiety    per pt, has panic attacks  . Arthritis    in back per pt.  . Arthritis of right hip   . Barrett's esophagus    egd 2007 + 2009 but none 2011 or 08/2010 egd  . Chronic headache   . Colon polyp    hyperplastic  . Depression    hx of suicide attempt in 2003  . Grief reaction   . Hidradenitis suppurativa   . IBS (irritable bowel syndrome)   . Migraine   . OSA (obstructive sleep apnea)   . Ovarian cyst   . Restless leg   . Tobacco abuse      Social History   Socioeconomic History  . Marital status: Divorced    Spouse name: Not  on file  . Number of children: 2  . Years of education: Not on file  . Highest education level: Not on file  Occupational History  . Occupation: Bartender  Tobacco Use  . Smoking status: Former Smoker    Packs/day: 0.50    Years: 30.00    Pack years: 15.00    Types: Cigarettes    Quit date: 06/21/2015    Years since quitting: 3.6  . Smokeless tobacco: Never Used  Substance and Sexual Activity  . Alcohol use: Yes    Alcohol/week: 0.0 standard drinks    Comment: 2 x a month  . Drug use: No  . Sexual activity: Not on file  Other Topics Concern  . Not on file  Social History Narrative   Occupation: bartender   Divorced  -- currently single   Alcohol use-yes -x 5 per weekend   No longer a smoker   Social Determinants of Radio broadcast assistant Strain: Low Risk   . Difficulty of Paying Living Expenses: Not very hard  Food Insecurity: No Food Insecurity  . Worried About Charity fundraiser in the Last Year: Never true  . Ran Out of Food in the Last Year:  Never true  Transportation Needs: No Transportation Needs  . Lack of Transportation (Medical): No  . Lack of Transportation (Non-Medical): No  Physical Activity: Inactive  . Days of Exercise per Week: 0 days  . Minutes of Exercise per Session: 0 min  Stress: Stress Concern Present  . Feeling of Stress : Very much  Social Connections: Moderately Isolated  . Frequency of Communication with Friends and Family: More than three times a week  . Frequency of Social Gatherings with Friends and Family: More than three times a week  . Attends Religious Services: Never  . Active Member of Clubs or Organizations: No  . Attends Archivist Meetings: Never  . Marital Status: Divorced  Human resources officer Violence:   . Fear of Current or Ex-Partner: Not on file  . Emotionally Abused: Not on file  . Physically Abused: Not on file  . Sexually Abused: Not on file    Past Surgical History:  Procedure Laterality Date  .  ABDOMINAL HYSTERECTOMY     secondary to endometriosis  . CESAREAN SECTION     x 2  . CHOLECYSTECTOMY    . COLONOSCOPY W/ POLYPECTOMY  2012  . ELBOW SURGERY     left  . LAPAROSCOPY  04-22-10   lysis of adhesions  . LAPAROSCOPY  04-30-10    with mini laparotomy and cystotomy repair  . plantar fascitis     right foot  . SALPINGECTOMY  04-22-10   right  . TOTAL HIP ARTHROPLASTY Right 04/25/2017   Procedure: RIGHT TOTAL HIP ARTHROPLASTY ANTERIOR APPROACH;  Surgeon: Rod Can, MD;  Location: [facility];  Service: Orthopedics;  Laterality: Right;  Needs RNFA  . TUBAL LIGATION    . WRIST SURGERY     right/ganglion cyst    Family History  Problem Relation Age of Onset  . COPD Mother   . Stroke Mother   . Coronary artery disease Mother        pacer  . Colon polyps Mother   . Breast cancer Mother   . Migraines Mother   . Other Mother        RSDS  . Rheum arthritis Mother   . Hypertension Father   . Colon polyps Brother   . Colon cancer Maternal Grandfather   . Colon polyps Maternal Grandfather   . Breast cancer Maternal Aunt   . Uterine cancer Cousin        x 2  . Aneurysm Maternal Grandmother   . Kidney disease Brother   . Other Brother        back pain  . Kidney cancer Paternal Aunt   . Stomach cancer Neg Hx     PMHx, SurgHx, SocialHx, FamHx, Medications, and Allergies were reviewed in the Visit Navigator and updated as appropriate.   Patient Active Problem List   Diagnosis Date Noted  . Irritable bowel syndrome with both constipation and diarrhea 02/28/2019  . PTSD (post-traumatic stress disorder) 02/28/2019  . Bipolar 1 disorder ([place]) 02/28/2019  . Insulin resistance 04/23/2018  . Morbid obesity ([place]), BMI > 35 with comorbid conditions 04/23/2018  . Chronic bilateral low back pain 12/07/2017  . Chronic constipation 12/02/2017  . Primary osteoarthritis of right hip 04/25/2017  . Gastroparesis 02/15/2017  . Osteoarthritis 09/24/2016  . Chronic pansinusitis 08/26/2016   . 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 Ilioinguinal neuralgia 03/11/2015  . 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 Chronic pelvic pain in female 07/06/2012  . Cystocele 07/06/2012  . Rectocele 07/06/2012  .  Hypersomnia 01/30/2012  . Obstructive sleep apnea syndrome   . Depressive disorder   . Right lumbosacral radiculopathy 07/10/2010  . Dyslipidemia 05/05/2009  . Chronic headache disorder 09/28/2007    Social History   Tobacco Use  . Smoking status: Former Smoker    Packs/day: 0.50    Years: 30.00    Pack years: 15.00    Types: Cigarettes    Quit date: 06/21/2015    Years since quitting: 3.6  . Smokeless tobacco: Never Used  Substance Use Topics  . Alcohol use: Yes    Alcohol/week: 0.0 standard drinks    Comment: 2 x a month  . Drug use: No    Current Medications and Allergies:    Current Outpatient Medications:  .  acetaminophen (TYLENOL) 500 MG tablet, Take 3,000 mg by mouth., Disp: , Rfl:  .  acetone, urine, test (RELION KETONE) strip, 1 strip by Does not apply route as needed for high blood sugar., Disp: 25 each, Rfl: 0 .  ALPRAZolam (XANAX) 0.5 MG tablet, Take 1 tablet (0.5 mg total) by mouth at bedtime as needed for anxiety., Disp: 30 tablet, Rfl: 2 .  blood glucose meter kit and supplies KIT, Dispense based on patient and insurance preference. Use up to four times daily as directed. (FOR ICD-9 250.00, 250.01)., Disp: 1 each, Rfl: 0 .  diclofenac sodium (VOLTAREN) 1 % GEL, Apply 2 g topically 2 (two) times daily., Disp: , Rfl:  .  DULoxetine (CYMBALTA) 60 MG capsule, Take 1 capsule (60 mg total) by mouth daily., Disp: 30 capsule, Rfl: 2 .  gabapentin (NEURONTIN) 400 MG capsule, TAKE 1 CAPSULE BY MOUTH 3 TIMES DAILY., Disp: 90 capsule, Rfl: 1 .  ibuprofen (ADVIL) 200 MG tablet, Take 400 mg by mouth every 6 (six) hours as needed., Disp: , Rfl:  .  lamoTRIgine (LAMICTAL) 150 MG tablet, Take 1 tablet (150 mg total) by mouth daily. TAKE 1 TABLET BY  MOUTH EVERY DAY, Disp: 30 tablet, Rfl: 2 .  nitroGLYCERIN (NITROSTAT) 0.4 MG SL tablet, nitroglycerin 0.4 mg sublingual tablet  as needed for chest pain, Disp: , Rfl:  .  pantoprazole (PROTONIX) 40 MG tablet, TAKE 1 TABLET BY MOUTH EVERY DAY, Disp: 90 tablet, Rfl: 3 .  rOPINIRole (REQUIP) 1 MG tablet, TAKE 2 TABLETS (2 MG TOTAL) BY MOUTH AT BEDTIME., Disp: 60 tablet, Rfl: 0 .  simvastatin (ZOCOR) 20 MG tablet, TAKE 1 TABLET BY MOUTH EVERYDAY AT BEDTIME, Disp: 90 tablet, Rfl: 0 .  SUMAtriptan (IMITREX) 100 MG tablet, TAKE 1 TABLET AT ONSET OF HEADACHE. MAY REPEAT IN 2 HOURS IF NEED. MAX 2 TABS IN 24 HOURS, Disp: 9 tablet, Rfl: 0 .  topiramate (TOPAMAX) 200 MG tablet, Take 1 tablet (200 mg total) by mouth at bedtime., Disp: 90 tablet, Rfl: 0 .  traZODone (DESYREL) 50 MG tablet, Take 1 tablet (50 mg total) by mouth at bedtime as needed for sleep., Disp: 30 tablet, Rfl: 2 .  valACYclovir (VALTREX) 500 MG tablet, TAKE 1 TABLET BY MOUTH EVERY DAY, Disp: 30 tablet, Rfl: 0 .  ARIPiprazole (ABILIFY) 5 MG tablet, Take 1 tablet (5 mg total) by mouth daily. (Patient not taking: Reported on 02/28/2019), Disp: 30 tablet, Rfl: 2 .  linaclotide (LINZESS) 290 MCG CAPS capsule, TAKE 1 TABLET BY MOUTH DAILY, Disp: , Rfl:  .  metFORMIN (GLUCOPHAGE-XR) 500 MG 24 hr tablet, TAKE 1 TABLET BY MOUTH TWICE A DAY (Patient not taking: Reported on 02/28/2019), Disp: 180 tablet, Rfl: 1   Allergies  Allergen Reactions  . Morphine Hives and Nausea And Vomiting    INTOLERANCE >  N & V  . Penicillins Hives, Itching and Other (See Comments)     PATIENT HAS HAD A PCN REACTION WITH IMMEDIATE RASH, FACIAL/TONGUE/THROAT SWELLING, SOB, OR LIGHTHEADEDNESS WITH HYPOTENSION:  #  #  #  YES  #  #  #   Has patient had a PCN reaction causing severe rash involving mucus membranes or skin necrosis: Unknown Has patient had a PCN reaction that required hospitalization: No Has patient had a PCN reaction occurring within the last 10 years: No If all  of the above answers are "NO", then may proceed with Cephalosporin use.     Review of Systems   ROS Negative unless otherwise specified per HPI.  Vitals:   Vitals:   02/28/19 0957  BP: 110/70  Pulse: 93  Temp: (!) 97.2 F (36.2 C)  TempSrc: Temporal  SpO2: 98%  Weight: 245 lb (111.1 kg)  Height: '5\' 6"'  (1.676 m)     Body mass index is 39.54 kg/m.   Physical Exam:    Physical Exam Vitals and nursing note reviewed.  Constitutional:      General: She is not in acute distress.    Appearance: She is well-developed. She is not ill-appearing or toxic-appearing.  Cardiovascular:     Rate and Rhythm: Normal rate and regular rhythm.     Pulses: Normal pulses.     Heart sounds: Normal heart sounds, S1 normal and S2 normal.     Comments: No LE edema Pulmonary:     Effort: Pulmonary effort is normal.     Breath sounds: Normal breath sounds.  Skin:    General: Skin is warm and dry.  Neurological:     Mental Status: She is alert.     GCS: GCS eye subscore is 4. GCS verbal subscore is 5. GCS motor subscore is 6.  Psychiatric:        Speech: Speech normal.        Behavior: Behavior normal. Behavior is cooperative.      Assessment and Plan:    [name] White was seen today for transfer of care, headache and nausea and diarrhea.  Diagnoses and all orders for this visit:  Irritable bowel syndrome with both constipation and diarrhea Uncontrolled. Recommend following up with Dr. Silverio Decamp, as it has been almost 2 years since she has seen her. We did discuss that polypharmacy could be contributing to her nausea, as well as her gastroparesis. She is going to recheck her blood sugars prn when feeling nauseous to see if that is causing her symptoms. Glucometer given in office today.  -     CBC with Differential/Platelet -     Comprehensive metabolic panel  Insulin resistance Due for recheck. Consider resuming metformin but reluctant given ongoing diarrhea. I recommend that she see Dr.  Silverio Decamp regarding her IBS before deciding to restart Metformin. -     Hemoglobin A1c  HA Per Dr. Tomi Likens.  Need for immunization against influenza -     Flu Vaccine QUAD 36+ mos IM  . Reviewed expectations re: course of current medical issues. . Discussed self-management of symptoms. . Outlined signs and symptoms indicating need for more acute intervention. . Patient verbalized understanding and all questions were answered. . See orders for this visit as documented in the electronic medical record. . Patient received an After Visit Summary.  CMA or LPN served as scribe during this visit. History, Physical, and Plan  performed by medical provider. The above documentation has been reviewed and is accurate and complete.  I spent 30 minutes with this patient, greater than 50% was face-to-face time counseling regarding the above diagnoses.  Inda Coke, PA-C [place], [place] 02/28/2019  Follow-up: No follow-ups on file.

## 2019-03-07 ENCOUNTER — Telehealth: Payer: Self-pay | Admitting: *Deleted

## 2019-03-07 NOTE — Telephone Encounter (Signed)
Received refill request from pharmacy for Ropinirole 1 mg tablet and Valtrex 500 mg tablet. Last OV 02/28/2019.

## 2019-03-08 ENCOUNTER — Other Ambulatory Visit: Payer: Self-pay | Admitting: Physician Assistant

## 2019-03-08 MED ORDER — ROPINIROLE HCL 1 MG PO TABS: 2.0000 mg | ORAL_TABLET | Freq: Every day | ORAL | 2 refills | Status: DC

## 2019-03-08 MED ORDER — VALACYCLOVIR HCL 500 MG PO TABS: 500.0000 mg | ORAL_TABLET | Freq: Every day | ORAL | 0 refills | Status: DC

## 2019-03-08 NOTE — Telephone Encounter (Signed)
Sent!

## 2019-03-21 ENCOUNTER — Encounter: Payer: Self-pay | Admitting: Neurology

## 2019-03-23 NOTE — Progress Notes (Signed)
Due to the COVID-19 crisis, this virtual check-in visit was done via telephone from my office and it was initiated and consent given by this patient and or family.   Telephone (Audio) Visit The purpose of this telephone visit is to provide medical care while limiting exposure to the novel coronavirus.    Consent was obtained for telephone visit and initiated by pt/family:  Yes.   Answered questions that patient had about telehealth interaction:  Yes.   I discussed the limitations, risks, security and privacy concerns of performing an evaluation and management service by telephone. I also discussed with the patient that there may be a patient responsible charge related to this service. The patient expressed understanding and agreed to proceed.  Pt location: Home Physician Location: office Name of referring provider:  Briscoe Deutscher, DO I connected with .[name] White at patients initiation/request on 03/26/2019 at  8:30 AM EST by telephone and verified that I am speaking with the correct person using two identifiers.  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  UPDATE: Topiramate was increased back in November 2019. Typical migraines have been well-controlled, occurring 1 or 2 a month and lasting an hour with sumatriptan.   No stabbing headaches. For about a month, she has had a "nagging headache".  They are dull non-pressure pain at top of head above her forehead, when she first wakes up and then dissipates after an hour.  She will take 5 or 6 Tylenol daily.  She thinks she may need to have her eyes checked.    Current NSAIDS: None Current analgesics: None Current triptans: Sumatriptan 100 mg Current ergotamine: None Current anti-emetic: None Current muscle relaxants: None Current anti-anxiolytic: Alprazolam Current sleep aide: Alprazolam Current  Antihypertensive medications: None Current Antidepressant medications: None Current Anticonvulsant medications: Topiramate 200 mg at bedtime Current anti-CGRP: None Current Vitamins/Herbal/Supplements: None Current Antihistamines/Decongestants: None Other therapy: None Hormone/birth control: none  Caffeine: No Diet: Increased water, decrease chocolate Exercise: No Depression: Yes; Anxiety: Yes Other pain: Back and hip pain Sleep hygiene: Poor.  She has OSA but not using a CPAP.for about 5 months.    HISTORY: Onset: Around early 74s Location: Top of head, band-like distribution Quality: squeezing Initial Intensity: 7/10 (sometimes 10/10): She denies new headache, worst headache of her life or severe headache that wakes her up from sleep. Aura: no Prodrome: no Associated symptoms: Nausea, photophobia, phonophobia. She denies unilateral numbness or weakness. Initial Duration: 4 hours or sometimes up to a day Initial Frequency: 3 to 4 times a month Triggers: Tea Relieving factors: Sleep in dark Activity: Needs to lay down to sleep  Past NSAIDS: ibuprofen Past analgesics: Tylenol, Excedrin Past abortive triptans: sumatriptan 25mg  (does not recall ever taking) Past muscle relaxants:no  Past anti-emetic: Zofran 8mg , Phenergan Past sleep aide: melatonin, Ambien Past antihypertensive medications: no Past antidepressant medications: venlafaxine XR 150mg  (for depression). Cymbalta (started briefly for two weeks) Past anticonvulsant medications: gabapentin 1200mg  three times daily, Lyrica 100mg  three times daily (both for neuropathic nerve pain from hip surgery)   Family history of headache: Mother and grandmother had migraines. Grandmother had aneurysm.  In 2017, she was reporting episodes of dizziness that occur suddenly, usually while walking. It is not positional. It is not spinning but rather a wave-like movement. There is no associated nausea,  double vision, focal numbness. It lasts for  a few minutes and resolves. It occurs twice a day. She takes meclizine, which is not too effective. CTA of head was unremarkable, revealing no vertebrobasilar insufficiency. Etiology did not seem neurologic.  MRI of brain without contrast from 06/05/15 and showed complex enlarged pineal gland over 1.9 cm with mild flattening of the superior colliculus. 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 Repeat MRI of the brain with and without contrast from 07/26/16 again demonstrated stable non-enhancing pineal gland cyst, but otherwise unremarkable except for some paranasal sinus disease.  Observations/Objective:  Height 5\' 6"  (1.676 m), weight 240 lb (108.9 kg).  Assessment and Plan:   1.  Suspect nagging headache may be multifactorial and related to uncontrolled OSA and medication-overuse. 2.  Migraine without aura, without status migrainosus, not intractable 2.  Primary stabbing headache  1. Advised to restart using her CPAP, have a formal eye exam to check if new prescription is needed, and to limit use of pain relievers to no more than 2 days out of the week.  2. For preventative management, topiramate 200mg  at bedtime 3.  For migraine abortive therapy, sumatriptan 100mg  4.  Keep headache diary 5.  Follow up in 4 months.  Follow Up Instructions:    -I discussed the assessment and treatment plan with the patient. The patient was provided an opportunity to ask questions and all were answered. The patient agreed with the plan and demonstrated an understanding of the instructions.   The patient was advised to call back or seek an in-person evaluation if the symptoms worsen or if the condition fails to improve as anticipated.    Total Time spent in visit with the patient was:  10 minutes  [name] Major, DO

## 2019-03-26 ENCOUNTER — Encounter: Payer: Self-pay | Admitting: Neurology

## 2019-03-26 ENCOUNTER — Other Ambulatory Visit: Payer: Self-pay

## 2019-03-26 ENCOUNTER — Telehealth (INDEPENDENT_AMBULATORY_CARE_PROVIDER_SITE_OTHER): Payer: Self-pay | Admitting: Neurology

## 2019-03-26 VITALS — Ht 66.0 in | Wt 240.0 lb

## 2019-03-26 DIAGNOSIS — G4485 Primary stabbing headache: Secondary | ICD-10-CM

## 2019-03-26 DIAGNOSIS — R519 Headache, unspecified: Secondary | ICD-10-CM

## 2019-03-26 DIAGNOSIS — G4733 Obstructive sleep apnea (adult) (pediatric): Secondary | ICD-10-CM

## 2019-03-26 DIAGNOSIS — G43009 Migraine without aura, not intractable, without status migrainosus: Secondary | ICD-10-CM

## 2019-03-26 MED ORDER — SUMATRIPTAN SUCCINATE 100 MG PO TABS: ORAL_TABLET | ORAL | 5 refills | Status: DC

## 2019-04-08 ENCOUNTER — Other Ambulatory Visit: Payer: Self-pay | Admitting: Physical Medicine & Rehabilitation

## 2019-04-09 ENCOUNTER — Other Ambulatory Visit (HOSPITAL_COMMUNITY): Payer: Self-pay | Admitting: Psychiatry

## 2019-04-09 DIAGNOSIS — F4310 Post-traumatic stress disorder, unspecified: Secondary | ICD-10-CM

## 2019-04-19 ENCOUNTER — Encounter: Payer: Self-pay | Admitting: Physical Medicine & Rehabilitation

## 2019-04-19 ENCOUNTER — Encounter: Payer: 59 | Attending: Physical Medicine & Rehabilitation | Admitting: Physical Medicine & Rehabilitation

## 2019-04-19 ENCOUNTER — Other Ambulatory Visit: Payer: Self-pay

## 2019-04-19 VITALS — BP 104/70 | HR 93 | Temp 97.5°F | Ht 66.0 in | Wt 245.0 lb

## 2019-04-19 DIAGNOSIS — M47816 Spondylosis without myelopathy or radiculopathy, lumbar region: Secondary | ICD-10-CM | POA: Diagnosis present

## 2019-04-19 MED ORDER — DIAZEPAM 10 MG PO TABS: 10.0000 mg | ORAL_TABLET | Freq: Once | ORAL | 0 refills | Status: AC

## 2019-04-19 NOTE — Patient Instructions (Signed)
Plan on radiofreq, take valium when you first arrive in clinic

## 2019-04-19 NOTE — Progress Notes (Signed)
Subjective:    Patient ID: [name] White, female    DOB: 30-Jun-1965, 54 y.o.   MRN: [id number] 54 year old female with history of right groin pain following pelvic surgery in 2012.  She was followed in this clinic from 2016 through 2019. She had hip x-rays that showed mild osteoarthritis of both hips.  She had hypersensitivity to touch over the skin of her inguinal area on the right side only. Underwent Right iliohypogastric cryo x 3 2016, 2017 and 2018.  THe first time lasted 62mo or more  last time it lasted only 2wks.   Because the patient had only short-term relief with cryoablation of this nerve she was referred to [facility] to be evaluated for peripheral nerve stimulation.  The patient saw Dr. [name] and underwent sacroiliac injections which helped on the left side only, also underwent medial branch blocks L3-L4-L5 which helped with the low back pain and had radiofrequency ablation in 2019 as well.  The patient also underwent bilateral trochanteric bursa injections under fluoroscopic guidance which were not of much benefit.   [name] RIght L3-4-5 MBB and RFA which was helpful   Sacroiliac injection worked on left side but did not help with Right sided pain   RIght hip an dgroin is the worst pain Pain bending forward and pain with picking up the Right leg   Right hip THA March 2019 per Dr Lyla Glassing, I recommended intra-articular injection prior to that time but not clear whether this was performed.  Patient did not recall this.   The patient received hydromorphone 2 mg every 8 hours as well as Lyrica 50 3 times daily at [facility]. She had an occasion where patient fell onto her knees and was late to work on several occasions due to oversleeping.  She has been on benzodiazepines in combination with narcotic analgesics at that time.  Her gabapentin dose was 900 mg/day Patient was then seen at [facility] clinic   The patient also sees Dr. Tomi Likens from  neurology for migraines.  She has been treated with topiramate 200 mg nightly.  This makes her tired.   Patient has also been referred to [facility] pain management Dr. Coralie Keens.  An MRI of the lumbar spine dated 06/29/2017 showed mild bilateral neuroforaminal stenosis L5-S1, evidence of moderate facet arthrosis L4-5 and mild facet arthrosis L5-S1   The patient saw another orthopedic surgeon 05/19/2018, this was for insurance reasons   Her other medication management includes gabapentin 300 mg 3 times daily which does help and mainly notes when she does not take it   Repeat MRI right hip 08/06/2018 showed no abnormalities.  Right hip endoprosthesis well-placed   Repeat MRI of the lumbar spine on 07/16/2018 showed lumbar facet arthrosis L4-5 greater than L3-4 greater than L5-S1    HPI Chief complaint: Right lateral hip pain 54 year old female with complex past medical history.  She has had chronic hip and groin pain.  Her groin pain was relieved with iliohypogastric nerve block transverse abdominis plane but not relieved with THA despite showing radiographic changes consistent with right hip osteoarthritis. She has had good relief with right medial branch blocks performed at another institution targeting L3-L4 medial branch L5 dorsal ramus.  She had greater then 50% relief of her usual right-sided low back pain with this procedure on 2 occasions followed by radiofrequency neurotomy of the same nerves last performed greater than 1 year ago.  She has had gradual worsening of her right lateral hip  pain.  Denies any groin pain at the current time   pain Inventory Average Pain 7 Pain Right Now 7 My pain is constant, stabbing and aching  In the last 24 hours, has pain interfered with the following? General activity 9 Relation with others 8 Enjoyment of life 10 What TIME of day is your pain at its worst? varies Sleep (in general) Good  Pain is worse with: walking, bending, sitting, inactivity,  standing and some activites Pain improves with: rest Relief from Meds: na  Mobility use a cane how many minutes can you walk? 1 ability to climb steps?  no do you drive?  yes  Function disabled: date disabled . I need assistance with the following:  household duties and shopping  Neuro/Psych bladder control problems bowel control problems weakness numbness tingling trouble walking spasms confusion depression anxiety suicidal thoughts  Prior Studies Any changes since last visit?  no  Physicians involved in your care Any changes since last visit?  no   Family History  Problem Relation Age of Onset  . COPD Mother   . Stroke Mother   . Coronary artery disease Mother        pacer  . Colon polyps Mother   . Breast cancer Mother   . Migraines Mother   . Other Mother        RSDS  . Rheum arthritis Mother   . Hypertension Father   . Colon polyps Brother   . Colon cancer Maternal Grandfather   . Colon polyps Maternal Grandfather   . Breast cancer Maternal Aunt   . Uterine cancer Cousin        x 2  . Aneurysm Maternal Grandmother   . Kidney disease Brother   . Other Brother        back pain  . Kidney cancer Paternal Aunt   . Stomach cancer Neg Hx    Social History   Socioeconomic History  . Marital status: Divorced    Spouse name: Not on file  . Number of children: 2  . Years of education: Not on file  . Highest education level: Not on file  Occupational History  . Occupation: Bartender  Tobacco Use  . Smoking status: Former Smoker    Packs/day: 0.50    Years: 30.00    Pack years: 15.00    Types: Cigarettes    Quit date: 06/21/2015    Years since quitting: 3.8  . Smokeless tobacco: Never Used  Substance and Sexual Activity  . Alcohol use: Yes    Alcohol/week: 0.0 standard drinks    Comment: 2 x a month  . Drug use: No  . Sexual activity: Not on file  Other Topics Concern  . Not on file  Social History Narrative   Occupation: bartender    Divorced  -- currently single   Alcohol use-yes -x 5 per weekend   No longer a smoker   Social Determinants of Radio broadcast assistant Strain:   . Difficulty of Paying Living Expenses: Not on file  Food Insecurity:   . Worried About Charity fundraiser in the Last Year: Not on file  . Ran Out of Food in the Last Year: Not on file  Transportation Needs:   . Lack of Transportation (Medical): Not on file  . Lack of Transportation (Non-Medical): Not on file  Physical Activity:   . Days of Exercise per Week: Not on file  . Minutes of Exercise per Session: Not on file  Stress:   . Feeling of Stress : Not on file  Social Connections:   . Frequency of Communication with Friends and Family: Not on file  . Frequency of Social Gatherings with Friends and Family: Not on file  . Attends Religious Services: Not on file  . Active Member of Clubs or Organizations: Not on file  . Attends Archivist Meetings: Not on file  . Marital Status: Not on file   Past Surgical History:  Procedure Laterality Date  . ABDOMINAL HYSTERECTOMY     secondary to endometriosis  . CESAREAN SECTION     x 2  . CHOLECYSTECTOMY    . COLONOSCOPY W/ POLYPECTOMY  2012  . ELBOW SURGERY     left  . LAPAROSCOPY  04-22-10   lysis of adhesions  . LAPAROSCOPY  04-30-10    with mini laparotomy and cystotomy repair  . plantar fascitis     right foot  . SALPINGECTOMY  04-22-10   right  . TOTAL HIP ARTHROPLASTY Right 04/25/2017   Procedure: RIGHT TOTAL HIP ARTHROPLASTY ANTERIOR APPROACH;  Surgeon: Rod Can, MD;  Location: [facility];  Service: Orthopedics;  Laterality: Right;  Needs RNFA  . TUBAL LIGATION    . WRIST SURGERY     right/ganglion cyst   Past Medical History:  Diagnosis Date  . Anterolisthesis   . Anxiety    per pt, has panic attacks  . Arthritis    in back per pt.  . Arthritis of right hip   . Barrett's esophagus    egd 2007 + 2009 but none 2011 or 08/2010 egd  . Chronic headache   .  Colon polyp    hyperplastic  . Depression    hx of suicide attempt in 2003  . Grief reaction   . Hidradenitis suppurativa   . IBS (irritable bowel syndrome)   . Migraine   . OSA (obstructive sleep apnea)   . Ovarian cyst   . Restless leg   . Tobacco abuse    BP 104/70   Pulse 93   Temp (!) 97.5 F (36.4 C)   Ht 5\' 6"  (1.676 m)   Wt 245 lb (111.1 kg)   LMP  (LMP Unknown)   SpO2 97%   BMI 39.54 kg/m   Opioid Risk Score:   Fall Risk Score:  `1  Depression screen PHQ 2/9  Depression screen [facility] 2/9 04/19/2019 02/28/2019 11/17/2018 09/20/2018 04/11/2018 09/02/2017 02/08/2017  Decreased Interest 1 3 1 3 3 3 3   Down, Depressed, Hopeless 1 3 1 1 3 2 3   PHQ - 2 Score 2 6 2 4 6 5 6   Altered sleeping - 3 1 3 3 3 3   Tired, decreased energy - 3 1 3 3 3 3   Change in appetite - 3 1 3 3 3 3   Feeling bad or failure about yourself  - 3 0 1 1 1 3   Trouble concentrating - 2 0 2 2 2 2   Moving slowly or fidgety/restless - 2 1 3 2 2  0  Suicidal thoughts - 1 0 0 1 0 0  PHQ-9 Score - 23 6 19 21 19 20   Difficult doing work/chores - Very difficult Extremely dIfficult Very difficult Very difficult Very difficult Somewhat difficult  Some recent data might be hidden    Review of Systems  Constitutional: Positive for unexpected weight change.  Musculoskeletal: Positive for gait problem.  Neurological: Positive for weakness and numbness.  Psychiatric/Behavioral: Positive for confusion, dysphoric mood and suicidal  ideas. The patient is nervous/anxious.        Objective:   Physical Exam Vitals and nursing note reviewed.  Constitutional:      Appearance: Normal appearance.  HENT:     Head: Normocephalic and atraumatic.  Eyes:     Extraocular Movements: Extraocular movements intact.     Conjunctiva/sclera: Conjunctivae normal.     Pupils: Pupils are equal, round, and reactive to light.  Abdominal:     General: There is no distension.  Skin:    General: Skin is warm and dry.  Neurological:      Mental Status: She is alert and oriented to person, place, and time.  Psychiatric:        Mood and Affect: Mood normal.        Behavior: Behavior normal.        Thought Content: Thought content normal.        Judgment: Judgment normal.     No hip pain with range of motion no groin pain elicited with hip internal and external rotation Patient has tenderness palpation in the lumbar paraspinals from L4-S1 pain increases with right lateral bending as well as lumbar extension. Negative straight leg raising test Normal strength in left hip flexors knee extensors and ankle dorsiflexors.  4/5 strength in the right hip flexors knee extensors ankle dorsiflexors partially limited by pain.       Assessment & Plan:  #1.  Right lower back and lateral hip pain.  This is all likely related to lumbar spondylosis in the lower lumbar levels.  She has had a good response to right L3 medial branch and right L4 medial branch as well as right L5 dorsal ramus radiofrequency neurotomy.  It has been greater than 1 year and this has likely worn off.  Recommending repeat procedure.  Patient requests Valium p.o. prior to procedure have given her prescription for 10 mg Valium to be taken as soon as she gets to the office

## 2019-04-24 ENCOUNTER — Other Ambulatory Visit: Payer: Self-pay | Admitting: Neurology

## 2019-04-30 ENCOUNTER — Encounter (HOSPITAL_COMMUNITY): Payer: Self-pay | Admitting: Psychiatry

## 2019-04-30 ENCOUNTER — Other Ambulatory Visit: Payer: Self-pay

## 2019-04-30 ENCOUNTER — Ambulatory Visit (INDEPENDENT_AMBULATORY_CARE_PROVIDER_SITE_OTHER): Payer: 59 | Admitting: Psychiatry

## 2019-04-30 DIAGNOSIS — F319 Bipolar disorder, unspecified: Secondary | ICD-10-CM | POA: Diagnosis not present

## 2019-04-30 DIAGNOSIS — F410 Panic disorder [episodic paroxysmal anxiety] without agoraphobia: Secondary | ICD-10-CM | POA: Diagnosis not present

## 2019-04-30 DIAGNOSIS — F4310 Post-traumatic stress disorder, unspecified: Secondary | ICD-10-CM

## 2019-04-30 MED ORDER — DULOXETINE HCL 60 MG PO CPEP: 60.0000 mg | ORAL_CAPSULE | Freq: Every day | ORAL | 2 refills | Status: DC

## 2019-04-30 MED ORDER — ARIPIPRAZOLE 5 MG PO TABS: 5.0000 mg | ORAL_TABLET | Freq: Every day | ORAL | 2 refills | Status: DC

## 2019-04-30 MED ORDER — LAMOTRIGINE 150 MG PO TABS: 150.0000 mg | ORAL_TABLET | Freq: Every day | ORAL | 2 refills | Status: DC

## 2019-04-30 MED ORDER — ALPRAZOLAM 0.5 MG PO TABS: 0.5000 mg | ORAL_TABLET | Freq: Every evening | ORAL | 2 refills | Status: DC | PRN

## 2019-04-30 MED ORDER — TRAZODONE HCL 50 MG PO TABS: 50.0000 mg | ORAL_TABLET | Freq: Every evening | ORAL | 2 refills | Status: DC | PRN
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
## 2019-05-04 ENCOUNTER — Encounter: Payer: 59 | Attending: Physical Medicine & Rehabilitation | Admitting: Physical Medicine & Rehabilitation

## 2019-05-04 ENCOUNTER — Encounter: Payer: Self-pay | Admitting: Physical Medicine & Rehabilitation

## 2019-05-04 ENCOUNTER — Other Ambulatory Visit: Payer: Self-pay

## 2019-05-04 VITALS — BP 113/77 | HR 75 | Temp 97.8°F | Ht 66.0 in | Wt 245.0 lb

## 2019-05-04 DIAGNOSIS — M47816 Spondylosis without myelopathy or radiculopathy, lumbar region: Secondary | ICD-10-CM

## 2019-05-04 NOTE — Progress Notes (Signed)
  PROCEDURE RECORD [facility]   Name: [name] White DOB:1965-08-02 MRN: [id number]  Date:05/04/2019  Physician: Alysia Penna, MD    Nurse/CMA: Naftula Donahue CMA  Allergies:  Allergies  Allergen Reactions  . Morphine Hives and Nausea And Vomiting    INTOLERANCE >  N & V  . Penicillins Hives, Itching and Other (See Comments)     PATIENT HAS HAD A PCN REACTION WITH IMMEDIATE RASH, FACIAL/TONGUE/THROAT SWELLING, SOB, OR LIGHTHEADEDNESS WITH HYPOTENSION:  #  #  #  YES  #  #  #   Has patient had a PCN reaction causing severe rash involving mucus membranes or skin necrosis: Unknown Has patient had a PCN reaction that required hospitalization: No Has patient had a PCN reaction occurring within the last 10 years: No If all of the above answers are "NO", then may proceed with Cephalosporin use.     Consent Signed: Yes.    Is patient diabetic? No.  CBG today? NA  Pregnant: No. LMP: No LMP recorded (lmp unknown). Patient has had a hysterectomy. (age [age])  Anticoagulants: no Anti-inflammatory: no Antibiotics: no  Procedure: right L3-5 RFA  Position: Prone   Start Time: 11:13am End Time: 1127am Fluoro Time: 39s  RN/CMA Lamonta Cypress CMA Vihan Santagata CMA    Time 1054am 1138am    BP 113/77 114/74    Pulse 75 75    Respirations 16 16    O2 Sat 97 96    S/S 6 6    Pain Level 7/10 10/10     D/C home with Sister-in-law, patient A & O X 3, D/C instructions reviewed, and sits independently.

## 2019-05-04 NOTE — Progress Notes (Signed)
RightL5 dorsal ramus., Right L4 and Right L3 medial branch radio frequency neurotomy under fluoroscopic guidance   Indication: Low back pain due to lumbar spondylosis which has been relieved on 2 occasions by greater than 50% by lumbar medial branch blocks at corresponding levels.  Informed consent was obtained after describing risks and benefits of the procedure with the patient, this includes bleeding, bruising, infection, paralysis and medication side effects. The patient wishes to proceed and has given written consent. The patient was placed in a prone position. The lumbar and sacral area was marked and prepped with Betadine. A 25-gauge 1-1/2 inch needle was inserted into the skin and subcutaneous tissue at 3 sites in one ML of 1% lidocaine was injected into each site. Then a 18-gauge 15 cm radio frequency needle with a 1 cm curved active tip was inserted targeting the Right S1 SAP/sacral ala junction. Bone contact was made and confirmed with lateral imaging.  motor stimulation at 2 Hz confirm proper needle location followed by injection of 46m 2% MPF lidocaine. Then the Right L5 SAP/transverse process junction was targeted. Bone contact was made and confirmed with lateral imaging.  motor stimulation at 2 Hz confirm proper needle location followed by injection of 145m2% MPF lidocaine. Then the Right L4 SAP/transverse process junction was targeted. Bone contact was made and confirmed with lateral imaging. motor stimulation at 2 Hz confirm proper needle location followed by injection of 63m9m% MPF lidocaine. Radio frequency [facility] 90 seconds was performed. Needles were removed. Post procedure instructions and vital signs were performed. Patient tolerated procedure well. Followup appointment was given.

## 2019-05-04 NOTE — Patient Instructions (Signed)
You had a radio frequency procedure today This was done to alleviate joint pain in your lumbar area We injected lidocaine which is a local anesthetic.  You may experience soreness at the injection sites. You may also experienced some irritation of the nerves that were heated I'm recommending ice for 30 minutes every 2 hours as needed for the next 24-48 hours You may double up on Gabapentin today only if needed for pain

## 2019-05-23 ENCOUNTER — Other Ambulatory Visit (HOSPITAL_COMMUNITY): Payer: Self-pay | Admitting: Psychiatry

## 2019-05-23 DIAGNOSIS — F319 Bipolar disorder, unspecified: Secondary | ICD-10-CM

## 2019-05-28 ENCOUNTER — Other Ambulatory Visit: Payer: Self-pay | Admitting: Physician Assistant

## 2019-05-29 ENCOUNTER — Ambulatory Visit (INDEPENDENT_AMBULATORY_CARE_PROVIDER_SITE_OTHER): Payer: 59 | Admitting: Physician Assistant

## 2019-05-29 ENCOUNTER — Encounter: Payer: Self-pay | Admitting: Physician Assistant

## 2019-05-29 ENCOUNTER — Other Ambulatory Visit: Payer: Self-pay

## 2019-05-29 VITALS — BP 110/68 | HR 79 | Temp 98.3°F | Ht 66.0 in | Wt 247.5 lb

## 2019-05-29 DIAGNOSIS — G4733 Obstructive sleep apnea (adult) (pediatric): Secondary | ICD-10-CM

## 2019-05-29 DIAGNOSIS — G8929 Other chronic pain: Secondary | ICD-10-CM

## 2019-05-29 NOTE — Patient Instructions (Signed)
It was great to see you!  Use the cpap tonight! And every night.  I will put the referral in the for the pain management.  Let's follow-up in 6 months for a physical, sooner if you have concerns.  Take care,  Inda Coke PA-C

## 2019-05-29 NOTE — Progress Notes (Signed)
[name] White is a 53 y.o. female is here to follow up.  I acted as a Education administrator for [employer], PA-C Anselmo Pickler, LPN  History of Present Illness:   Chief Complaint  Patient presents with  . Insulin resistance    HPI   Chronic pain R hip and back pain ongoing. Used to see [facility] but they stopped taking her insurance. She saw her psychiatrist who recommended that she consider getting back in with pain management to help with her mood and sleep.   OSA Not using CPAP currently but planning to use it. Was recommended to restart using it by her neurologist to better help with her headaches.   Health Maintenance Due  Topic Date Due  . MAMMOGRAM  06/19/2018    Past Medical History:  Diagnosis Date  . Anterolisthesis   . Anxiety    per pt, has panic attacks  . Arthritis    in back per pt.  . Arthritis of right hip   . Barrett's esophagus    egd 2007 + 2009 but none 2011 or 08/2010 egd  . Chronic headache   . Colon polyp    hyperplastic  . Depression    hx of suicide attempt in 2003  . Grief reaction   . Hidradenitis suppurativa   . IBS (irritable bowel syndrome)   . Migraine   . OSA (obstructive sleep apnea)   . Ovarian cyst   . Restless leg   . Tobacco abuse      Social History   Socioeconomic History  . Marital status: Divorced    Spouse name: Not on file  . Number of children: 2  . Years of education: Not on file  . Highest education level: Not on file  Occupational History  . Occupation: Bartender  Tobacco Use  . Smoking status: Former Smoker    Packs/day: 0.50    Years: 30.00    Pack years: 15.00    Types: Cigarettes    Quit date: 06/21/2015    Years since quitting: 3.9  . Smokeless tobacco: Never Used  Substance and Sexual Activity  . Alcohol use: Yes    Alcohol/week: 0.0 standard drinks    Comment: 2 x a month  . Drug use: No  . Sexual activity: Not on file  Other Topics Concern  . Not on file  Social History  Narrative   Occupation: bartender   Divorced  -- currently single   Alcohol use-yes -x 5 per weekend   No longer a smoker   Social Determinants of Radio broadcast assistant Strain:   . Difficulty of Paying Living Expenses:   Food Insecurity:   . Worried About Charity fundraiser in the Last Year:   . Arboriculturist in the Last Year:   Transportation Needs:   . Film/video editor (Medical):   Marland Kitchen Lack of Transportation (Non-Medical):   Physical Activity:   . Days of Exercise per Week:   . Minutes of Exercise per Session:   Stress:   . Feeling of Stress :   Social Connections:   . Frequency of Communication with Friends and Family:   . Frequency of Social Gatherings with Friends and Family:   . Attends Religious Services:   . Active Member of Clubs or Organizations:   . Attends Archivist Meetings:   Marland Kitchen Marital Status:   Intimate Partner Violence:   . Fear of Current or Ex-Partner:   .  Emotionally Abused:   Marland Kitchen Physically Abused:   . Sexually Abused:     Past Surgical History:  Procedure Laterality Date  . ABDOMINAL HYSTERECTOMY     secondary to endometriosis  . CESAREAN SECTION     x 2  . CHOLECYSTECTOMY    . COLONOSCOPY W/ POLYPECTOMY  2012  . ELBOW SURGERY     left  . LAPAROSCOPY  04-22-10   lysis of adhesions  . LAPAROSCOPY  04-30-10    with mini laparotomy and cystotomy repair  . plantar fascitis     right foot  . SALPINGECTOMY  04-22-10   right  . TOTAL HIP ARTHROPLASTY Right 04/25/2017   Procedure: RIGHT TOTAL HIP ARTHROPLASTY ANTERIOR APPROACH;  Surgeon: Rod Can, MD;  Location: [facility];  Service: Orthopedics;  Laterality: Right;  Needs RNFA  . TUBAL LIGATION    . WRIST SURGERY     right/ganglion cyst    Family History  Problem Relation Age of Onset  . COPD Mother   . Stroke Mother   . Coronary artery disease Mother        pacer  . Colon polyps Mother   . Breast cancer Mother   . Migraines Mother   . Other Mother        RSDS  .  Rheum arthritis Mother   . Hypertension Father   . Colon polyps Brother   . Colon cancer Maternal Grandfather   . Colon polyps Maternal Grandfather   . Breast cancer Maternal Aunt   . Uterine cancer Cousin        x 2  . Aneurysm Maternal Grandmother   . Kidney disease Brother   . Other Brother        back pain  . Kidney cancer Paternal Aunt   . Stomach cancer Neg Hx     PMHx, SurgHx, SocialHx, FamHx, Medications, and Allergies were reviewed in the Visit Navigator and updated as appropriate.   Patient Active Problem List   Diagnosis Date Noted  . Irritable bowel syndrome with both constipation and diarrhea 02/28/2019  . PTSD (post-traumatic stress disorder) 02/28/2019  . Bipolar 1 disorder ([place]) 02/28/2019  . Insulin resistance 04/23/2018  . Morbid obesity ([place]), BMI > 35 with comorbid conditions 04/23/2018  . Chronic bilateral low back pain 12/07/2017  . Chronic constipation 12/02/2017  . Primary osteoarthritis of right hip 04/25/2017  . Gastroparesis 02/15/2017  . Osteoarthritis 09/24/2016  . Chronic pansinusitis 08/26/2016  . 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 Ilioinguinal neuralgia 03/11/2015  . 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 Chronic pelvic pain in female 07/06/2012  . Cystocele 07/06/2012  . Rectocele 07/06/2012  . Hypersomnia 01/30/2012  . Obstructive sleep apnea syndrome   . Depressive disorder   . Right lumbosacral radiculopathy 07/10/2010  . Dyslipidemia 05/05/2009  . Chronic headache disorder 09/28/2007    Social History   Tobacco Use  . Smoking status: Former Smoker    Packs/day: 0.50    Years: 30.00    Pack years: 15.00    Types: Cigarettes    Quit date: 06/21/2015    Years since quitting: 3.9  . Smokeless tobacco: Never Used  Substance Use Topics  . Alcohol use: Yes    Alcohol/week: 0.0 standard drinks    Comment: 2 x a month  . Drug use: No    Current Medications and Allergies:    Current Outpatient  Medications:  .  acetaminophen (TYLENOL) 500 MG tablet, Take 3,000 mg by mouth., Disp: ,  Rfl:  .  acetone, urine, test (RELION KETONE) strip, 1 strip by Does not apply route as needed for high blood sugar., Disp: 25 each, Rfl: 0 .  ALPRAZolam (XANAX) 0.5 MG tablet, Take 1 tablet (0.5 mg total) by mouth at bedtime as needed for anxiety., Disp: 30 tablet, Rfl: 2 .  ARIPiprazole (ABILIFY) 5 MG tablet, Take 1 tablet (5 mg total) by mouth daily., Disp: 30 tablet, Rfl: 2 .  blood glucose meter kit and supplies KIT, Dispense based on patient and insurance preference. Use up to four times daily as directed. (FOR ICD-9 250.00, 250.01)., Disp: 1 each, Rfl: 0 .  DULoxetine (CYMBALTA) 60 MG capsule, Take 1 capsule (60 mg total) by mouth daily., Disp: 30 capsule, Rfl: 2 .  gabapentin (NEURONTIN) 400 MG capsule, TAKE 1 CAPSULE BY MOUTH THREE TIMES A DAY, Disp: 90 capsule, Rfl: 1 .  ibuprofen (ADVIL) 200 MG tablet, Take 400 mg by mouth every 6 (six) hours as needed., Disp: , Rfl:  .  lamoTRIgine (LAMICTAL) 150 MG tablet, Take 1 tablet (150 mg total) by mouth daily. TAKE 1 TABLET BY MOUTH EVERY DAY, Disp: 30 tablet, Rfl: 2 .  pantoprazole (PROTONIX) 40 MG tablet, TAKE 1 TABLET BY MOUTH EVERY DAY, Disp: 90 tablet, Rfl: 3 .  rOPINIRole (REQUIP) 1 MG tablet, Take 2 tablets (2 mg total) by mouth at bedtime., Disp: 60 tablet, Rfl: 2 .  simvastatin (ZOCOR) 20 MG tablet, TAKE 1 TABLET BY MOUTH EVERYDAY AT BEDTIME, Disp: 90 tablet, Rfl: 0 .  SUMAtriptan (IMITREX) 100 MG tablet, TAKE 1 TABLET AT ONSET OF HEADACHE. MAY REPEAT IN 2 HOURS IF NEED. MAX 2 TABS IN 24 HOURS, Disp: 9 tablet, Rfl: 5 .  topiramate (TOPAMAX) 200 MG tablet, TAKE 1 TABLET (200 MG TOTAL) BY MOUTH AT BEDTIME., Disp: 90 tablet, Rfl: 0 .  traZODone (DESYREL) 50 MG tablet, Take 1 tablet (50 mg total) by mouth at bedtime as needed for sleep., Disp: 30 tablet, Rfl: 2 .  valACYclovir (VALTREX) 500 MG tablet, Take 1 tablet (500 mg total) by mouth daily., Disp: 90  tablet, Rfl: 0   Allergies  Allergen Reactions  . Morphine Hives and Nausea And Vomiting    INTOLERANCE >  N & V  . Penicillins Hives, Itching and Other (See Comments)     PATIENT HAS HAD A PCN REACTION WITH IMMEDIATE RASH, FACIAL/TONGUE/THROAT SWELLING, SOB, OR LIGHTHEADEDNESS WITH HYPOTENSION:  #  #  #  YES  #  #  #   Has patient had a PCN reaction causing severe rash involving mucus membranes or skin necrosis: Unknown Has patient had a PCN reaction that required hospitalization: No Has patient had a PCN reaction occurring within the last 10 years: No If all of the above answers are "NO", then may proceed with Cephalosporin use.     Review of Systems   ROS  Negative unless otherwise specified per HPI.  Vitals:   Vitals:   05/29/19 1009  BP: 110/68  Pulse: 79  Temp: 98.3 F (36.8 C)  TempSrc: Temporal  SpO2: 96%  Weight: 247 lb 8 oz (112.3 kg)  Height: '5\' 6"'  (1.676 m)     Body mass index is 39.95 kg/m.   Physical Exam:    Physical Exam Constitutional:      Appearance: She is well-developed.  HENT:     Head: Normocephalic and atraumatic.  Eyes:     Conjunctiva/sclera: Conjunctivae normal.  Pulmonary:     Effort: Pulmonary effort is  normal.  Musculoskeletal:        General: Normal range of motion.     Cervical back: Normal range of motion and neck supple.  Skin:    General: Skin is warm and dry.  Neurological:     Mental Status: She is alert and oriented to person, place, and time.  Psychiatric:        Behavior: Behavior normal.        Thought Content: Thought content normal.        Judgment: Judgment normal.      Assessment and Plan:    [name] White was seen today for insulin resistance.  Diagnoses and all orders for this visit:  Other chronic pain Uncontrolled. Will refer to new Pain Clinic that accepts her insurance. -     Ambulatory referral to Pain Clinic  Obstructive sleep apnea syndrome Encouraged use of CPAP.   Marland Kitchen Reviewed expectations  re: course of current medical issues. . Discussed self-management of symptoms. . Outlined signs and symptoms indicating need for more acute intervention. . Patient verbalized understanding and all questions were answered. . See orders for this visit as documented in the electronic medical record. . Patient received an After Visit Summary.  CMA or LPN served as scribe during this visit. History, Physical, and Plan performed by medical provider. The above documentation has been reviewed and is accurate and complete.   Inda Coke, PA-C [place] [place] 05/29/2019  Follow-up: No follow-ups on file.

## 2019-05-31 ENCOUNTER — Other Ambulatory Visit: Payer: Self-pay | Admitting: Physician Assistant

## 2019-05-31 DIAGNOSIS — Z1231 Encounter for screening mammogram for malignant neoplasm of breast: Secondary | ICD-10-CM

## 2019-06-01 ENCOUNTER — Other Ambulatory Visit: Payer: Self-pay

## 2019-06-01 ENCOUNTER — Ambulatory Visit
Admission: RE | Admit: 2019-06-01 | Discharge: 2019-06-01 | Disposition: A | Discharge status: Home / Self Care | Payer: 59 | Source: Ambulatory Visit | Attending: Physician Assistant | Admitting: Physician Assistant

## 2019-06-01 DIAGNOSIS — Z1231 Encounter for screening mammogram for malignant neoplasm of breast: Secondary | ICD-10-CM

## 2019-06-06 ENCOUNTER — Other Ambulatory Visit: Payer: Self-pay | Admitting: Physical Medicine & Rehabilitation

## 2019-06-07 ENCOUNTER — Other Ambulatory Visit: Payer: Self-pay | Admitting: Physician Assistant

## 2019-06-28 ENCOUNTER — Encounter: Payer: Self-pay | Admitting: Physician Assistant

## 2019-06-28 DIAGNOSIS — G8929 Other chronic pain: Secondary | ICD-10-CM

## 2019-06-28 DIAGNOSIS — M25511 Pain in right shoulder: Secondary | ICD-10-CM

## 2019-06-28 DIAGNOSIS — G8911 Acute pain due to trauma: Secondary | ICD-10-CM

## 2019-06-28 NOTE — Telephone Encounter (Signed)
Do you happen to know which pain management takes her new insurance?

## 2019-06-29 NOTE — Telephone Encounter (Signed)
Ok to place referral.

## 2019-07-02 NOTE — Addendum Note (Signed)
Addended bySerita Sheller on: 07/02/2019 11:53 AM   Modules accepted: Orders

## 2019-07-17 ENCOUNTER — Other Ambulatory Visit: Payer: Self-pay | Admitting: Neurology

## 2019-07-26 ENCOUNTER — Ambulatory Visit: Payer: Self-pay | Admitting: Neurology

## 2019-07-30 ENCOUNTER — Other Ambulatory Visit: Payer: Self-pay

## 2019-07-30 ENCOUNTER — Telehealth (INDEPENDENT_AMBULATORY_CARE_PROVIDER_SITE_OTHER): Payer: 59 | Admitting: Psychiatry

## 2019-07-30 DIAGNOSIS — F4310 Post-traumatic stress disorder, unspecified: Secondary | ICD-10-CM | POA: Diagnosis not present

## 2019-07-30 DIAGNOSIS — F410 Panic disorder [episodic paroxysmal anxiety] without agoraphobia: Secondary | ICD-10-CM | POA: Diagnosis not present

## 2019-07-30 DIAGNOSIS — F319 Bipolar disorder, unspecified: Secondary | ICD-10-CM

## 2019-07-30 MED ORDER — DULOXETINE HCL 60 MG PO CPEP: 60.0000 mg | ORAL_CAPSULE | Freq: Every day | ORAL | 2 refills | Status: DC

## 2019-07-30 MED ORDER — ARIPIPRAZOLE 5 MG PO TABS: 5.0000 mg | ORAL_TABLET | Freq: Every day | ORAL | 2 refills | Status: DC

## 2019-07-30 MED ORDER — LAMOTRIGINE 150 MG PO TABS: 150.0000 mg | ORAL_TABLET | Freq: Every day | ORAL | 2 refills | Status: DC

## 2019-07-30 MED ORDER — TRAZODONE HCL 50 MG PO TABS: 50.0000 mg | ORAL_TABLET | Freq: Every evening | ORAL | 2 refills | Status: DC | PRN
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
## 2019-08-08 ENCOUNTER — Other Ambulatory Visit: Payer: Self-pay | Admitting: Student

## 2019-08-08 DIAGNOSIS — M7061 Trochanteric bursitis, right hip: Secondary | ICD-10-CM

## 2019-08-08 DIAGNOSIS — Z96641 Presence of right artificial hip joint: Secondary | ICD-10-CM

## 2019-08-11 ENCOUNTER — Other Ambulatory Visit: Payer: Self-pay | Admitting: Physical Medicine & Rehabilitation

## 2019-08-16 ENCOUNTER — Encounter
Admission: RE | Admit: 2019-08-16 | Discharge: 2019-08-16 | Disposition: A | Discharge status: Home / Self Care | Payer: 59 | Source: Ambulatory Visit | Attending: Student | Admitting: Student

## 2019-08-16 ENCOUNTER — Other Ambulatory Visit: Payer: Self-pay

## 2019-08-16 DIAGNOSIS — M7061 Trochanteric bursitis, right hip: Secondary | ICD-10-CM

## 2019-08-16 DIAGNOSIS — Z96641 Presence of right artificial hip joint: Secondary | ICD-10-CM | POA: Insufficient documentation

## 2019-08-16 MED ORDER — TECHNETIUM TC 99M MEDRONATE IV KIT
20.0000 | PACK | Freq: Once | INTRAVENOUS | Status: AC | PRN
  Administered 2019-08-16: 20.74 via INTRAVENOUS

## 2019-09-27 ENCOUNTER — Other Ambulatory Visit: Payer: Self-pay | Admitting: Neurology

## 2019-10-04 ENCOUNTER — Other Ambulatory Visit: Payer: Self-pay

## 2019-10-04 ENCOUNTER — Encounter: Payer: 59 | Attending: Physical Medicine & Rehabilitation | Admitting: Physical Medicine & Rehabilitation

## 2019-10-04 ENCOUNTER — Encounter: Payer: Self-pay | Admitting: Physical Medicine & Rehabilitation

## 2019-10-04 VITALS — BP 109/75 | HR 80 | Temp 98.6°F | Ht 66.0 in | Wt 235.8 lb

## 2019-10-04 DIAGNOSIS — G588 Other specified mononeuropathies: Secondary | ICD-10-CM | POA: Insufficient documentation

## 2019-10-04 DIAGNOSIS — M47816 Spondylosis without myelopathy or radiculopathy, lumbar region: Secondary | ICD-10-CM | POA: Diagnosis not present

## 2019-10-04 NOTE — Progress Notes (Signed)
Subjective:    Patient ID: [name] White, female    DOB: 25-Nov-1965, 54 y.o.   MRN: [id number] 54 year old female with history of right groin pain following pelvic surgery in 2012.  She was followed in this clinic from 2016 through 2019. She had hip x-rays that showed mild osteoarthritis of both hips.  She had hypersensitivity to touch over the skin of her inguinal area on the right side only. Underwent Right iliohypogastric cryo x 3 2016, 2017 and 2018.  THe first time lasted 68mo or more  last time it lasted only 2wks.   Because the patient had only short-term relief with cryoablation of this nerve she was referred to [facility] to be evaluated for peripheral nerve stimulation.  The patient saw Dr. [name] and underwent sacroiliac injections which helped on the left side only, also underwent medial branch blocks L3-L4-L5 which helped with the low back pain and had radiofrequency ablation in 2019 as well.  The patient also underwent bilateral trochanteric bursa injections under fluoroscopic guidance which were not of much benefit.   [facility] RIght L3-4-5 MBB and RFA which was helpful   Sacroiliac injection worked on left side but did not help with Right sided pain   RIght hip an dgroin is the worst pain Pain bending forward and pain with picking up the Right leg   Right hip THA March 2019 per Dr Lyla Glassing, I recommended intra-articular injection prior to that time but not clear whether this was performed.  Patient did not recall this. The patient also sees Dr. Tomi Likens from neurology for migraines.  She has been treated with topiramate 200 mg nightly.  This makes her tired.   Patient has also been referred to [facility] pain management Dr. Coralie Keens.  An MRI of the lumbar spine dated 06/29/2017 showed mild bilateral neuroforaminal stenosis L5-S1, evidence of moderate facet arthrosis L4-5 and mild facet arthrosis L5-S1   The patient saw another orthopedic surgeon 05/19/2018,  this was for insurance reasons   Her other medication management includes gabapentin 300 mg 3 times daily which does help and mainly notes when she does not take it   Repeat MRI right hip 08/06/2018 showed no abnormalities.  Right hip endoprosthesis well-placed   Repeat MRI of the lumbar spine on 07/16/2018 showed lumbar facet arthrosis L4-5 greater than L3-4 greater than L5-S1  HPI  Continues with back pain increased over the last month or so.  She feels like it is in the middle of the lumbar area.  No falls or trauma to that area recently.  No pain radiating into the leg.  She does have her usual right-sided groin pain but also has right-sided thigh pain which increases with ambulation.  She feels like something is moving in her thigh 05/04/2019     RightL5 dorsal ramus., Right L4 and Right L3 medial branch radio frequency neurotomy under fluoroscopic guidance      Had appt with [facility] to eval RIght hip pain,  Has EMG scheduled next month at [facility]  No longer taking Gabapentin due poor effect Pain Inventory Average Pain 7 Pain Right Now 9 My pain is constant, sharp, stabbing, tingling and aching  In the last 24 hours, has pain interfered with the following? General activity 10 Relation with others 8 Enjoyment of life 10 What TIME of day is your pain at its worst? all Sleep (in general) Poor  Pain is worse with: walking, bending, sitting, standing and some activites Pain improves  with: na Relief from Meds: na  Mobility walk with assistance use a cane how many minutes can you walk? 2 ability to climb steps?  no do you drive?  yes  Function disabled: date disabled . I need assistance with the following:  dressing  Neuro/Psych weakness numbness tingling trouble walking spasms depression anxiety  Prior Studies bone scan  Physicians involved in your care Orthopedist [facility]   Family History  Problem Relation Age of Onset  . COPD Mother   . Stroke Mother   .  Coronary artery disease Mother        pacer  . Colon polyps Mother   . Breast cancer Mother   . Migraines Mother   . Other Mother        RSDS  . Rheum arthritis Mother   . Hypertension Father   . Colon polyps Brother   . Colon cancer Maternal Grandfather   . Colon polyps Maternal Grandfather   . Breast cancer Maternal Aunt   . Uterine cancer Cousin        x 2  . Aneurysm Maternal Grandmother   . Kidney disease Brother   . Other Brother        back pain  . Kidney cancer Paternal Aunt   . Stomach cancer Neg Hx    Social History   Socioeconomic History  . Marital status: Divorced    Spouse name: Not on file  . Number of children: 2  . Years of education: Not on file  . Highest education level: Not on file  Occupational History  . Occupation: Bartender  Tobacco Use  . Smoking status: Former Smoker    Packs/day: 0.50    Years: 30.00    Pack years: 15.00    Types: Cigarettes    Quit date: 06/21/2015    Years since quitting: 4.2  . Smokeless tobacco: Never Used  Vaping Use  . Vaping Use: Never used  Substance and Sexual Activity  . Alcohol use: Yes    Alcohol/week: 0.0 standard drinks    Comment: 2 x a month  . Drug use: No  . Sexual activity: Not on file  Other Topics Concern  . Not on file  Social History Narrative   Occupation: bartender   Divorced  -- currently single   Alcohol use-yes -x 5 per weekend   No longer a smoker   Social Determinants of Radio broadcast assistant Strain:   . Difficulty of Paying Living Expenses:   Food Insecurity:   . Worried About Charity fundraiser in the Last Year:   . Arboriculturist in the Last Year:   Transportation Needs:   . Film/video editor (Medical):   Marland Kitchen Lack of Transportation (Non-Medical):   Physical Activity:   . Days of Exercise per Week:   . Minutes of Exercise per Session:   Stress:   . Feeling of Stress :   Social Connections:   . Frequency of Communication with Friends and Family:   . Frequency  of Social Gatherings with Friends and Family:   . Attends Religious Services:   . Active Member of Clubs or Organizations:   . Attends Archivist Meetings:   Marland Kitchen Marital Status:    Past Surgical History:  Procedure Laterality Date  . ABDOMINAL HYSTERECTOMY     secondary to endometriosis  . CESAREAN SECTION     x 2  . CHOLECYSTECTOMY    . COLONOSCOPY W/ POLYPECTOMY  2012  .  ELBOW SURGERY     left  . LAPAROSCOPY  04-22-10   lysis of adhesions  . LAPAROSCOPY  04-30-10    with mini laparotomy and cystotomy repair  . plantar fascitis     right foot  . SALPINGECTOMY  04-22-10   right  . TOTAL HIP ARTHROPLASTY Right 04/25/2017   Procedure: RIGHT TOTAL HIP ARTHROPLASTY ANTERIOR APPROACH;  Surgeon: Rod Can, MD;  Location: [facility];  Service: Orthopedics;  Laterality: Right;  Needs RNFA  . TUBAL LIGATION    . WRIST SURGERY     right/ganglion cyst   Past Medical History:  Diagnosis Date  . Anterolisthesis   . Anxiety    per pt, has panic attacks  . Arthritis    in back per pt.  . Arthritis of right hip   . Barrett's esophagus    egd 2007 + 2009 but none 2011 or 08/2010 egd  . Chronic headache   . Colon polyp    hyperplastic  . Depression    hx of suicide attempt in 2003  . Grief reaction   . Hidradenitis suppurativa   . IBS (irritable bowel syndrome)   . Migraine   . OSA (obstructive sleep apnea)   . Ovarian cyst   . Restless leg   . Tobacco abuse    BP 109/75   Pulse 80   Temp 98.6 F (37 C)   Ht 5\' 6"  (1.676 m)   Wt 235 lb 12.8 oz (107 kg)   LMP  (LMP Unknown)   SpO2 97%   BMI 38.06 kg/m   Opioid Risk Score:   Fall Risk Score:  `1  Depression screen PHQ 2/9  Depression screen [facility] 2/9 10/04/2019 04/19/2019 02/28/2019 11/17/2018 09/20/2018 04/11/2018 09/02/2017  Decreased Interest 1 1 3 1 3 3 3   Down, Depressed, Hopeless 1 1 3 1 1 3 2   PHQ - 2 Score 2 2 6 2 4 6 5   Altered sleeping - - 3 1 3 3 3   Tired, decreased energy - - 3 1 3 3 3   Change in appetite -  - 3 1 3 3 3   Feeling bad or failure about yourself  - - 3 0 1 1 1   Trouble concentrating - - 2 0 2 2 2   Moving slowly or fidgety/restless - - 2 1 3 2 2   Suicidal thoughts - - 1 0 0 1 0  PHQ-9 Score - - 23 6 19 21 19   Difficult doing work/chores - - Very difficult Extremely dIfficult Very difficult Very difficult Very difficult  Some recent data might be hidden    Review of Systems  Musculoskeletal: Positive for gait problem.       Spasms  Neurological: Positive for weakness and numbness.       Tingling   Psychiatric/Behavioral: Positive for dysphoric mood. The patient is nervous/anxious.   All other systems reviewed and are negative.      Objective:   Physical Exam Vitals reviewed.  Constitutional:      Appearance: She is obese.  HENT:     Head: Normocephalic and atraumatic.  Eyes:     Extraocular Movements: Extraocular movements intact.     Conjunctiva/sclera: Conjunctivae normal.     Pupils: Pupils are equal, round, and reactive to light.  Neurological:     Mental Status: She is alert and oriented to person, place, and time.     Comments: Reduced sensation right inguinal area, Reduced sensation right lateral thigh as well as right anterior  thigh to the knee.  Normal sensation below the knee Normal sensation on the left side in the thigh There is no evidence of muscle atrophy right thigh Strength is 4/5 in the right hip flexor knee extensor related to pain inhibition 5/5 left hip flexor knee extensor Bilateral ankle dorsiflexor plantar flexor 5/5 Negative straight leg raising  Psychiatric:        Mood and Affect: Mood normal.        Behavior: Behavior normal.           Assessment & Plan:  #1.  Chronic low back pain due to lumbar spine spondylosis she does relatively well with her right L3-L4 medial branch and right L5 dorsal ramus radiofrequency neurotomy.  Would recommend repeat next month. 2.  Right groin pain, originally responded well to ilioinguinal nerve  blocks but this became only a short-term relief.  She was referred to [facility] to do peripheral nerve stimulator however this was not done.  We discussed that once her orthopedic work-up is completed that she may request referral to [facility] pain clinic to see if peripheral nerve stimulator of the right iliohypogastric/ilioinguinal nerve and perhaps the right lateral femoral cutaneous nerve may be trialed. 3.  Right hip pain did undergo right hip replacement in [place].  May have some loosening of the femoral component is getting further work-up at [facility] Department of orthopedics.

## 2019-10-04 NOTE — Patient Instructions (Signed)
You may have 3 issues casuing pain  1.  RIght lumbar facet pain- will repeat RF next month 2. RIght ilioinguinal neuritis and Lateral femoral cutaneous neuritis- I asked [facility] to eval for peripheral nerve stimulator and may need to ask for eval at [facility] 3.  RIght Total hip joint loosning? Ortho to eval

## 2019-10-09 ENCOUNTER — Other Ambulatory Visit: Payer: Self-pay | Admitting: Gastroenterology

## 2019-10-09 ENCOUNTER — Other Ambulatory Visit: Payer: Self-pay | Admitting: Neurology

## 2019-10-09 ENCOUNTER — Encounter: Payer: Self-pay | Admitting: Gastroenterology

## 2019-10-09 ENCOUNTER — Other Ambulatory Visit: Payer: Self-pay | Admitting: Family Medicine

## 2019-10-25 ENCOUNTER — Telehealth: Payer: Self-pay | Admitting: *Deleted

## 2019-10-25 NOTE — Telephone Encounter (Signed)
Dr.Nandigam,  This patient is scheduled for a recall EGD. She has a recall assessment sheet in epic. Her last EGD was with you 2018=no specimens due to food residue and she needed to repeat in 3 months but I don't see that she did that. She did have an OV with you in 2019. She has had multiple EGD's, one with Dr.Mann 2017. Looking at the path results she had + Barrett's in 2009 only. Please review. Ok to proceed with EGD dx hx of barrett's. Thank you, Baylin Gamblin

## 2019-10-26 NOTE — Telephone Encounter (Signed)
[name] White, please have her follow up in office visit, will schedule egd from office if needed. Thank you

## 2019-10-30 ENCOUNTER — Encounter (HOSPITAL_COMMUNITY): Payer: Self-pay | Admitting: Psychiatry

## 2019-10-30 ENCOUNTER — Telehealth (INDEPENDENT_AMBULATORY_CARE_PROVIDER_SITE_OTHER): Payer: 59 | Admitting: Psychiatry

## 2019-10-30 ENCOUNTER — Other Ambulatory Visit: Payer: Self-pay

## 2019-10-30 DIAGNOSIS — F4310 Post-traumatic stress disorder, unspecified: Secondary | ICD-10-CM

## 2019-10-30 DIAGNOSIS — F319 Bipolar disorder, unspecified: Secondary | ICD-10-CM

## 2019-10-30 MED ORDER — ARIPIPRAZOLE 5 MG PO TABS: 5.0000 mg | ORAL_TABLET | Freq: Every day | ORAL | 2 refills | Status: DC

## 2019-10-30 MED ORDER — DULOXETINE HCL 60 MG PO CPEP: 60.0000 mg | ORAL_CAPSULE | Freq: Every day | ORAL | 2 refills | Status: DC

## 2019-10-30 MED ORDER — TRAZODONE HCL 50 MG PO TABS: 50.0000 mg | ORAL_TABLET | Freq: Every evening | ORAL | 2 refills | Status: DC | PRN

## 2019-10-30 MED ORDER — LAMOTRIGINE 150 MG PO TABS: 150.0000 mg | ORAL_TABLET | Freq: Every day | ORAL | 2 refills | Status: DC
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
## 2019-10-31 NOTE — Telephone Encounter (Signed)
Office visit was made.  

## 2019-11-01 ENCOUNTER — Telehealth: Payer: Self-pay | Admitting: *Deleted

## 2019-11-01 NOTE — Telephone Encounter (Signed)
Ms Coven called and has an injection scheduled for 11/09/19. She goes to [facility] and reports she was told she cannot go to both physicians because they are in the same field of practice. She is asking if Dr Letta Pate would be willing to prescribe her medication.  It looks like by the PMP she is on hydromorphone. I called her back and explained that there should be no conflict with her going both places because we are not prescribing her any pain medication. She does not want to see another doctor for her injections but Dr Letta Pate. I let her know I will send the message to Dr Letta Pate, but he does not prescribe hydromorphone for any patients.  Please advise.

## 2019-11-01 NOTE — Telephone Encounter (Signed)
As you know I do not prescribe hydromorphone, her options are to either  Have [facility] do her Radiofrequency procedure and prescribe hydromorphone Have radiofrequency procedure here and have PCP prescribe hydromorphone Have radiofrequency procedure here and switch to another pain med such as hydrocodone or buprenorphine after getting UDS

## 2019-11-02 ENCOUNTER — Encounter: Payer: Self-pay | Admitting: *Deleted

## 2019-11-02 NOTE — Telephone Encounter (Signed)
I have sent a mychart message informing her of her options.

## 2019-11-09 ENCOUNTER — Other Ambulatory Visit: Payer: Self-pay

## 2019-11-09 ENCOUNTER — Encounter: Payer: Self-pay | Admitting: Physical Medicine & Rehabilitation

## 2019-11-09 ENCOUNTER — Encounter: Payer: 59 | Attending: Physical Medicine & Rehabilitation | Admitting: Physical Medicine & Rehabilitation

## 2019-11-09 VITALS — BP 88/61 | HR 85 | Temp 98.8°F | Ht 66.0 in | Wt 239.0 lb

## 2019-11-09 DIAGNOSIS — G588 Other specified mononeuropathies: Secondary | ICD-10-CM | POA: Diagnosis present

## 2019-11-09 DIAGNOSIS — M47816 Spondylosis without myelopathy or radiculopathy, lumbar region: Secondary | ICD-10-CM | POA: Diagnosis present

## 2019-11-09 NOTE — Progress Notes (Signed)
RightL5 dorsal ramus., Right L4 and Right L3 medial branch radio frequency neurotomy under fluoroscopic guidance   Indication: Low back pain due to lumbar spondylosis which has been relieved on 2 occasions by greater than 50% by lumbar medial branch blocks at corresponding levels.  Informed consent was obtained after describing risks and benefits of the procedure with the patient, this includes bleeding, bruising, infection, paralysis and medication side effects. The patient wishes to proceed and has given written consent. The patient was placed in a prone position. The lumbar and sacral area was marked and prepped with Betadine. A 25-gauge 1-1/2 inch needle was inserted into the skin and subcutaneous tissue at 3 sites in one ML of 1% lidocaine was injected into each site. Then a 18-gauge 15 cm radio frequency needle with a 1 cm curved active tip was inserted targeting the Right S1 SAP/sacral ala junction. Bone contact was made and confirmed with lateral imaging.  motor stimulation at 2 Hz confirm proper needle location followed by injection of 46m 2% MPF lidocaine. Then the Right L5 SAP/transverse process junction was targeted. Bone contact was made and confirmed with lateral imaging.  motor stimulation at 2 Hz confirm proper needle location followed by injection of 145m2% MPF lidocaine. Then the Right L4 SAP/transverse process junction was targeted. Bone contact was made and confirmed with lateral imaging. motor stimulation at 2 Hz confirm proper needle location followed by injection of 63m9m% MPF lidocaine. Radio frequency [facility] 90 seconds was performed. Needles were removed. Post procedure instructions and vital signs were performed. Patient tolerated procedure well. Followup appointment was given.

## 2019-11-09 NOTE — Progress Notes (Signed)
  [facility]   Name: [name] White DOB:1966/02/09 MRN: [id number]  Date:11/09/2019  Physician: Alysia Penna, MD    Nurse/CMA: Truman Hayward, CMA  Allergies:  Allergies  Allergen Reactions  . Morphine Hives and Nausea And Vomiting    INTOLERANCE >  N & V  . Penicillins Hives, Itching and Other (See Comments)     PATIENT HAS HAD A PCN REACTION WITH IMMEDIATE RASH, FACIAL/TONGUE/THROAT SWELLING, SOB, OR LIGHTHEADEDNESS WITH HYPOTENSION:  #  #  #  YES  #  #  #   Has patient had a PCN reaction causing severe rash involving mucus membranes or skin necrosis: Unknown Has patient had a PCN reaction that required hospitalization: No Has patient had a PCN reaction occurring within the last 10 years: No If all of the above answers are "NO", then may proceed with Cephalosporin use.     Consent Signed: Yes.    Is patient diabetic? No.  CBG today?   Pregnant: No. LMP: No LMP recorded (lmp unknown). Patient has had a hysterectomy. (age [age])  Anticoagulants: no Anti-inflammatory: no Antibiotics: no  Procedure: Right L3-4-5 Radiofrequency Position: Prone Start Time: 1:27pm End Time:1:45pm  Fluoro Time: 26  RN/CMA Truman Hayward, CMA Daylynn Stumpp, CMA    Time 1:07pm 1:50[m    BP 88/61 109/76    Pulse 85 82    Respirations 16 16    O2 Sat 95 96    S/S 6 6    Pain Level 7/10 7/10     D/C home with no one, patient A & O X 3, D/C instructions reviewed, and sits independently.

## 2019-11-09 NOTE — Patient Instructions (Signed)

## 2019-11-20 ENCOUNTER — Encounter: Payer: 59 | Admitting: Gastroenterology

## 2019-11-25 ENCOUNTER — Other Ambulatory Visit: Payer: Self-pay | Admitting: Physician Assistant

## 2019-11-28 ENCOUNTER — Encounter: Payer: Self-pay | Admitting: Physician Assistant

## 2019-11-28 ENCOUNTER — Ambulatory Visit (INDEPENDENT_AMBULATORY_CARE_PROVIDER_SITE_OTHER): Payer: 59 | Admitting: Physician Assistant

## 2019-11-28 ENCOUNTER — Other Ambulatory Visit: Payer: Self-pay

## 2019-11-28 VITALS — BP 110/70 | HR 76 | Temp 98.0°F | Ht 66.0 in | Wt 247.4 lb

## 2019-11-28 DIAGNOSIS — E8881 Metabolic syndrome: Secondary | ICD-10-CM

## 2019-11-28 DIAGNOSIS — E669 Obesity, unspecified: Secondary | ICD-10-CM

## 2019-11-28 DIAGNOSIS — W540XXA Bitten by dog, initial encounter: Secondary | ICD-10-CM | POA: Diagnosis not present

## 2019-11-28 DIAGNOSIS — Z23 Encounter for immunization: Secondary | ICD-10-CM

## 2019-11-28 DIAGNOSIS — M7989 Other specified soft tissue disorders: Secondary | ICD-10-CM

## 2019-11-28 DIAGNOSIS — M79674 Pain in right toe(s): Secondary | ICD-10-CM | POA: Diagnosis not present

## 2019-11-28 DIAGNOSIS — S81831A Puncture wound without foreign body, right lower leg, initial encounter: Secondary | ICD-10-CM | POA: Diagnosis not present

## 2019-11-28 DIAGNOSIS — E785 Hyperlipidemia, unspecified: Secondary | ICD-10-CM

## 2019-11-28 DIAGNOSIS — Z720 Tobacco use: Secondary | ICD-10-CM

## 2019-11-28 DIAGNOSIS — Z0001 Encounter for general adult medical examination with abnormal findings: Secondary | ICD-10-CM

## 2019-11-28 DIAGNOSIS — K209 Esophagitis, unspecified without bleeding: Secondary | ICD-10-CM

## 2019-11-28 MED ORDER — PANTOPRAZOLE SODIUM 40 MG PO TBEC: 40.0000 mg | DELAYED_RELEASE_TABLET | Freq: Every day | ORAL | 3 refills | Status: DC

## 2019-11-28 MED ORDER — MUPIROCIN 2 % EX OINT: TOPICAL_OINTMENT | CUTANEOUS | 0 refills | Status: DC

## 2019-11-28 MED ORDER — DOXYCYCLINE HYCLATE 100 MG PO TABS
100.0000 mg | ORAL_TABLET | Freq: Two times a day (BID) | ORAL | 0 refills | Status: DC
Start: 2019-11-28 — End: 2019-12-25

## 2019-11-28 MED ORDER — FUROSEMIDE 20 MG PO TABS
20.0000 mg | ORAL_TABLET | Freq: Every day | ORAL | 0 refills | Status: DC | PRN
Start: 2019-11-28 — End: 2019-12-20

## 2019-11-28 NOTE — Patient Instructions (Addendum)
It was great to see you!  For your dog bite: -Start oral doxycycline -Add topical bactroban ointment (this has been sent in)  For your swelling -You will be contacted about ultrasound for your leg -We will update labs today -Trial lasix 20 mg daily as needed for swelling -We will also place an order for an echo   For your toe -wear supportive shoes -we will check a uric acid level to see if there is anything else contributing to your symptoms  Let me know if you'd like to meet with the genetics counselor  Please go to the lab for blood work.   Our office will call you with your results unless you have chosen to receive results via MyChart.  If your blood work is normal we will follow-up each year for physicals and as scheduled for chronic medical problems.  If anything is abnormal we will treat accordingly and get you in for a follow-up.  Take care,  [facility] Maintenance, Female Adopting a healthy lifestyle and getting preventive care are important in promoting health and wellness. Ask your health care provider about:  The right schedule for you to have regular tests and exams.  Things you can do on your own to prevent diseases and keep yourself healthy. What should I know about diet, weight, and exercise? Eat a healthy diet   Eat a diet that includes plenty of vegetables, fruits, low-fat dairy products, and lean protein.  Do not eat a lot of foods that are high in solid fats, added sugars, or sodium. Maintain a healthy weight Body mass index (BMI) is used to identify weight problems. It estimates body fat based on height and weight. Your health care provider can help determine your BMI and help you achieve or maintain a healthy weight. Get regular exercise Get regular exercise. This is one of the most important things you can do for your health. Most adults should:  Exercise for at least 150 minutes each week. The exercise should increase your heart rate  and make you sweat (moderate-intensity exercise).  Do strengthening exercises at least twice a week. This is in addition to the moderate-intensity exercise.  Spend less time sitting. Even light physical activity can be beneficial. Watch cholesterol and blood lipids Have your blood tested for lipids and cholesterol at 55 years of age, then have this test every 5 years. Have your cholesterol levels checked more often if:  Your lipid or cholesterol levels are high.  You are older than 55 years of age.  You are at high risk for heart disease. What should I know about cancer screening? Depending on your health history and family history, you may need to have cancer screening at various ages. This may include screening for:  Breast cancer.  Cervical cancer.  Colorectal cancer.  Skin cancer.  Lung cancer. What should I know about heart disease, diabetes, and high blood pressure? Blood pressure and heart disease  High blood pressure causes heart disease and increases the risk of stroke. This is more likely to develop in people who have high blood pressure readings, are of African descent, or are overweight.  Have your blood pressure checked: ? Every 3-5 years if you are 68-8 years of age. ? Every year if you are 62 years old or older. Diabetes Have regular diabetes screenings. This checks your fasting blood sugar level. Have the screening done:  Once every three years after age 53 if you are at a normal weight  and have a low risk for diabetes.  More often and at a younger age if you are overweight or have a high risk for diabetes. What should I know about preventing infection? Hepatitis B If you have a higher risk for hepatitis B, you should be screened for this virus. Talk with your health care provider to find out if you are at risk for hepatitis B infection. Hepatitis C Testing is recommended for:  Everyone born from [birth year] through 1966.  Anyone with known risk factors for  hepatitis C. Sexually transmitted infections (STIs)  Get screened for STIs, including gonorrhea and chlamydia, if: ? You are sexually active and are younger than 55 years of age. ? You are older than 55 years of age and your health care provider tells you that you are at risk for this type of infection. ? Your sexual activity has changed since you were last screened, and you are at increased risk for chlamydia or gonorrhea. Ask your health care provider if you are at risk.  Ask your health care provider about whether you are at high risk for HIV. Your health care provider may recommend a prescription medicine to help prevent HIV infection. If you choose to take medicine to prevent HIV, you should first get tested for HIV. You should then be tested every 3 months for as long as you are taking the medicine. Pregnancy  If you are about to stop having your period (premenopausal) and you may become pregnant, seek counseling before you get pregnant.  Take 400 to 800 micrograms (mcg) of folic acid every day if you become pregnant.  Ask for birth control (contraception) if you want to prevent pregnancy. Osteoporosis and menopause Osteoporosis is a disease in which the bones lose minerals and strength with aging. This can result in bone fractures. If you are 3 years old or older, or if you are at risk for osteoporosis and fractures, ask your health care provider if you should:  Be screened for bone loss.  Take a calcium or vitamin D supplement to lower your risk of fractures.  Be given hormone replacement therapy (HRT) to treat symptoms of menopause. Follow these instructions at home: Lifestyle  Do not use any products that contain nicotine or tobacco, such as cigarettes, e-cigarettes, and chewing tobacco. If you need help quitting, ask your health care provider.  Do not use street drugs.  Do not share needles.  Ask your health care provider for help if you need support or information about  quitting drugs. Alcohol use  Do not drink alcohol if: ? Your health care provider tells you not to drink. ? You are pregnant, may be pregnant, or are planning to become pregnant.  If you drink alcohol: ? Limit how much you use to 0-1 drink a day. ? Limit intake if you are breastfeeding.  Be aware of how much alcohol is in your drink. In the [place] one drink equals one 12 oz bottle of beer (355 mL), one 5 oz glass of wine (148 mL), or one 1 oz glass of hard liquor (44 mL). General instructions  Schedule regular health, dental, and eye exams.  Stay current with your vaccines.  Tell your health care provider if: ? You often feel depressed. ? You have ever been abused or do not feel safe at home. Summary  Adopting a healthy lifestyle and getting preventive care are important in promoting health and wellness.  Follow your health care provider's instructions about healthy diet, exercising, and  getting tested or screened for diseases.  Follow your health care provider's instructions on monitoring your cholesterol and blood pressure. This information is not intended to replace advice given to you by your health care provider. Make sure you discuss any questions you have with your health care provider. Document Revised: 02/01/2018 Document Reviewed: 02/01/2018 Elsevier Patient Education  2020 [employer].

## 2019-11-28 NOTE — Progress Notes (Signed)
I acted as a Education administrator for [employer], PA-C Anselmo Pickler, LPN  Subjective:    [name] White is a 54 y.o. female and is here for a comprehensive physical exam.  HPI  There are no preventive care reminders to display for this patient. Acute Concerns: Dog bite -- Pt was bitten by a dog 3 weeks ago on her right leg on the back of her calf. She states that the dog was vaccinated. The puncture area is red and sore. Has been using antibacterial ointment on the wound. Denies: fevers, chills, purulent drainage, surrounding erythema. R great toe pain -- Has had intermittent dull pain, worsens as the day goes on.  Denies trauma. Has not tried anything for her symptoms. Spends most of her day laying down due to hip pain. RLE swelling -- ongoing for a few months. She is having debilitating R hip pain and is s/p R hip arthroplasty in 2019. Denies prior blood clot hx, chest pain, SOB.  Chronic Issues: Tobacco abuse -- she stopped smoking in 2017 but has had intermittent stressful situations that is causing her to smoke. Reports that she had a pack of cigarettes within the past few months during a stressful event. Most days does not smoke. Insulin resistance -- last HgbA1c was 5.9%. She was on 500 mg metformin XR without significant GI side effects. Discontinued for unknown reasons. HLD -- was previously prescribed 20 mg simvastatin daily. She reports that she never knew she had this prescription. Esophagitis -- takes protonix 40 mg daily and tolerates this well, recently ran out of medication and because of that has had some flare of GERD.  Health Maintenance: Immunizations -- UTD, will give Flu and Tdap today, Declines Covid Colonoscopy -- overdue, has upcoming appt with GI to discuss. Mammogram -- UTD, due 05/2020 PAP --N/A Bone Density -- N/A Diet -- tries overall to eat healthy Sleep habits -- has issues at times with staying asleep Exercise -- none currently due to pain Weight -- Weight:  247 lb 6.1 oz (112.2 kg)  Mood -- overall stable per patient Weight history: Wt Readings from Last 10 Encounters:  11/28/19 247 lb 6.1 oz (112.2 kg)  11/09/19 239 lb (108.4 kg)  10/04/19 235 lb 12.8 oz (107 kg)  05/29/19 247 lb 8 oz (112.3 kg)  05/04/19 245 lb (111.1 kg)  04/19/19 245 lb (111.1 kg)  03/21/19 240 lb (108.9 kg)  02/28/19 245 lb (111.1 kg)  11/17/18 243 lb (110.2 kg)  10/02/18 236 lb (107 kg)   Body mass index is 39.93 kg/m. No LMP recorded (lmp unknown). Patient has had a hysterectomy. Alcohol use: rare Tobacco use: see above  Depression screen [facility] 2/9 11/28/2019  Decreased Interest 2  Down, Depressed, Hopeless 2  PHQ - 2 Score 4  Altered sleeping 2  Tired, decreased energy 2  Change in appetite 1  Feeling bad or failure about yourself  2  Trouble concentrating 1  Moving slowly or fidgety/restless 0  Suicidal thoughts 0  PHQ-9 Score 12  Difficult doing work/chores Extremely dIfficult  Some recent data might be hidden     Other providers/specialists: Patient Care Team: Inda Coke, [place] as PCP - General (Physician Assistant) Lafayette Dragon, MD (Inactive) as Consulting Physician (Gastroenterology) Maisie Fus, MD as Consulting Physician (Obstetrics and Gynecology) Nicholaus Bloom, MD (Anesthesiology) Rod Can, MD as Consulting Physician (Orthopedic Surgery)    PMHx, SurgHx, SocialHx, Medications, and Allergies were reviewed in the Visit Navigator and updated as appropriate.  Past Medical History:  Diagnosis Date  . Anterolisthesis   . Anxiety    per pt, has panic attacks  . Arthritis    in back per pt.  . Arthritis of right hip   . Barrett's esophagus    egd 2007 + 2009 but none 2011 or 08/2010 egd  . Chronic headache   . Colon polyp    hyperplastic  . Depression    hx of suicide attempt in 2003  . Grief reaction   . Hidradenitis suppurativa   . IBS (irritable bowel syndrome)   . Migraine   . OSA (obstructive sleep apnea)     . Ovarian cyst   . Restless leg   . Tobacco abuse      Past Surgical History:  Procedure Laterality Date  . ABDOMINAL HYSTERECTOMY     secondary to endometriosis  . CESAREAN SECTION     x 2  . CHOLECYSTECTOMY    . COLONOSCOPY W/ POLYPECTOMY  2012  . ELBOW SURGERY     left  . LAPAROSCOPY  04-22-10   lysis of adhesions  . LAPAROSCOPY  04-30-10    with mini laparotomy and cystotomy repair  . plantar fascitis     right foot  . SALPINGECTOMY  04-22-10   right  . TOTAL HIP ARTHROPLASTY Right 04/25/2017   Procedure: RIGHT TOTAL HIP ARTHROPLASTY ANTERIOR APPROACH;  Surgeon: Rod Can, MD;  Location: [facility];  Service: Orthopedics;  Laterality: Right;  Needs RNFA  . TUBAL LIGATION    . WRIST SURGERY     right/ganglion cyst     Family History  Problem Relation Age of Onset  . COPD Mother   . Stroke Mother   . Coronary artery disease Mother        pacer  . Colon polyps Mother   . Breast cancer Mother   . Migraines Mother   . Other Mother        RSDS  . Rheum arthritis Mother   . Hypertension Father   . Colon polyps Brother   . Colon cancer Maternal Grandfather   . Colon polyps Maternal Grandfather   . Breast cancer Maternal Aunt   . Uterine cancer Cousin        x 2  . Aneurysm Maternal Grandmother   . Kidney disease Brother   . Other Brother        back pain  . Kidney cancer Paternal Aunt   . Breast cancer Cousin   . Stomach cancer Neg Hx     Social History   Tobacco Use  . Smoking status: Former Smoker    Packs/day: 0.50    Years: 30.00    Pack years: 15.00    Types: Cigarettes    Quit date: 06/21/2015    Years since quitting: 4.4  . Smokeless tobacco: Never Used  Vaping Use  . Vaping Use: Never used  Substance Use Topics  . Alcohol use: Yes    Alcohol/week: 0.0 standard drinks    Comment: 2 x a month  . Drug use: No    Review of Systems:   Review of Systems  Constitutional: Negative for chills, fever, malaise/fatigue and weight loss.  HENT:  Negative for hearing loss, sinus pain and sore throat.   Respiratory: Negative for cough and hemoptysis.   Cardiovascular: Positive for leg swelling. Negative for chest pain, palpitations and PND.  Gastrointestinal: Negative for abdominal pain, constipation, diarrhea, heartburn, nausea and vomiting.  Genitourinary: Negative for dysuria, frequency and urgency.  Musculoskeletal: Negative for back pain, myalgias and neck pain.  Skin: Negative for itching and rash.  Neurological: Negative for dizziness, tingling, seizures and headaches.  Endo/Heme/Allergies: Negative for polydipsia.  Psychiatric/Behavioral: Negative for depression. The patient is not nervous/anxious.     Objective:   BP 110/70 (BP Location: Left Arm, Patient Position: Sitting, Cuff Size: Large)   Pulse 76   Temp 98 F (36.7 C) (Temporal)   Ht 5\' 6"  (1.676 m)   Wt 247 lb 6.1 oz (112.2 kg)   LMP  (LMP Unknown)   SpO2 99%   BMI 39.93 kg/m  Body mass index is 39.93 kg/m.   General Appearance:    Alert, cooperative, no distress, appears stated age  Head:    Normocephalic, without obvious abnormality, atraumatic  Eyes:    PERRL, conjunctiva/corneas clear, EOM's intact, fundi    benign, both eyes  Ears:    Normal TM's and external ear canals, both ears  Nose:   Nares normal, septum midline, mucosa normal, no drainage    or sinus tenderness  Throat:   Lips, mucosa, and tongue normal; teeth and gums normal  Neck:   Supple, symmetrical, trachea midline, no adenopathy;    thyroid:  no enlargement/tenderness/nodules; no carotid   bruit or JVD  Back:     Symmetric, no curvature, ROM normal, no CVA tenderness  Lungs:     Clear to auscultation bilaterally, respirations unlabored  Chest Wall:    No tenderness or deformity   Heart:    Regular rate and rhythm, S1 and S2 normal, no murmur, rub or gallop Pulses present in DP and PT of b/l LE  Breast Exam:    Deferred  Abdomen:     Soft, non-tender, bowel sounds active all four  quadrants,    no masses, no organomegaly  Genitalia:    Deferred  Extremities:   Extremities normal, atraumatic, no cyanosis  1+ edema to RLE, negative Homan's sign bilaterally R great MTP joint with pain with resisted flexion/extension  Pulses:   2+ and symmetric all extremities  Skin:   Skin color, texture, turgor normal, no rashes 1/2 cm puncture wound to R lateral calf; no drainage or TTP  Lymph nodes:   Cervical, supraclavicular, and axillary nodes normal  Neurologic:   CNII-XII intact, normal strength, sensation and reflexes    throughout     Assessment/Plan:   Idabell was seen today for annual exam.  Diagnoses and all orders for this visit:  Encounter for general adult medical examination with abnormal findings Today patient counseled on age appropriate routine health concerns for screening and prevention, each reviewed and up to date or declined. Immunizations reviewed and up to date or declined. Labs ordered and reviewed. Risk factors for depression reviewed and negative. Hearing function and visual acuity are intact. ADLs screened and addressed as needed. Functional ability and level of safety reviewed and appropriate. Education, counseling and referrals performed based on assessed risks today. Patient provided with a copy of personalized plan for preventive services.  Dog bite, initial encounter Update Tdap today. No red flags on exam -- no systemic symptoms. Will treat with oral doxycycline and topical bactroban to see if this can help aid in healing. If worsens or does not continue to improve, recommend follow-up. -     Tdap vaccine greater than or equal to 7yo IM  Pain of toe of right foot Possible osteoarthritis. Will check uric acid. She is already on pain medication through her pain mgmt provider. Consider  imaging vs sports medicine if does not improve or worsens. -     CBC with Differential/Platelet; Future -     Comprehensive metabolic panel; Future -     Uric  acid; Future -     Comprehensive metabolic panel -     CBC with Differential/Platelet -     Uric acid  Leg swelling RLE with swelling, chronic per patient. Will obtain u/s for further evaluation to r/o blood clot. Consider lasix trial to see if this helps. Update echocardiogram. If chest pain, SOB, or other concerns develop, she was instructed to proceed to the ER. Follow-up in 1 month. -     VAS [place] LOWER EXTREMITY VENOUS (DVT); Future -     ECHOCARDIOGRAM COMPLETE; Future  Insulin resistance Update A1c.  Encouraged reduction of concentrated sweets. -     Hemoglobin A1c; Future -     Hemoglobin A1c  Hyperlipidemia, unspecified hyperlipidemia type Update lipid panel. Add statin if indicated. -     Lipid panel; Future -     Lipid panel  Need for prophylactic vaccination with combined diphtheria-tetanus-pertussis (DTP) vaccine -     Tdap vaccine greater than or equal to 7yo IM  Esophagitis Refill protonix. Pending appt with GI in Nov.  Tobacco abuse Counseled.  Obesity Continue to work towards healthy lifestyle.  Need for immunization against influenza -     Flu Vaccine QUAD 36+ mos IM  Other orders -     doxycycline (VIBRA-TABS) 100 MG tablet; Take 1 tablet (100 mg total) by mouth 2 (two) times daily. -     mupirocin ointment (BACTROBAN) 2 %; Apply to affected area 1-2 times daily -     pantoprazole (PROTONIX) 40 MG tablet; Take 1 tablet (40 mg total) by mouth daily. -     furosemide (LASIX) 20 MG tablet; Take 1 tablet (20 mg total) by mouth daily as needed for fluid.    Well Adult Exam: Labs ordered: Yes. Patient counseling was done. See below for items discussed. Discussed the patient's BMI.  The BMI is not in the acceptable range; BMI management plan is completed Follow up in 1 month. Breast cancer screening: UTD. Cervical cancer screening: n/a   Patient Counseling: [x]    Nutrition: Stressed importance of moderation in sodium/caffeine intake, saturated fat and  cholesterol, caloric balance, sufficient intake of fresh fruits, vegetables, fiber, calcium, iron, and 1 mg of folate supplement per day (for females capable of pregnancy).  [x]    Stressed the importance of regular exercise.   [x]    Substance Abuse: Discussed cessation/primary prevention of tobacco, alcohol, or other drug use; driving or other dangerous activities under the influence; availability of treatment for abuse.   [x]    Injury prevention: Discussed safety belts, safety helmets, smoke detector, smoking near bedding or upholstery.   [x]    Sexuality: Discussed sexually transmitted diseases, partner selection, use of condoms, avoidance of unintended pregnancy  and contraceptive alternatives.  [x]    Dental health: Discussed importance of regular tooth brushing, flossing, and dental visits.  [x]    Health maintenance and immunizations reviewed. Please refer to Health maintenance section.   CMA or LPN served as scribe during this visit. History, Physical, and Plan performed by medical provider. The above documentation has been reviewed and is accurate and complete.  Inda Coke, PA-C [place]

## 2019-11-29 ENCOUNTER — Encounter: Payer: Self-pay | Admitting: Physician Assistant

## 2019-11-29 ENCOUNTER — Other Ambulatory Visit: Payer: Self-pay | Admitting: Physician Assistant

## 2019-11-29 ENCOUNTER — Ambulatory Visit (HOSPITAL_COMMUNITY)
Admission: RE | Admit: 2019-11-29 | Discharge: 2019-11-29 | Disposition: A | Discharge status: Home / Self Care | Payer: 59 | Source: Ambulatory Visit | Attending: Physician Assistant | Admitting: Physician Assistant

## 2019-11-29 ENCOUNTER — Telehealth: Payer: Self-pay | Admitting: *Deleted

## 2019-11-29 DIAGNOSIS — M7989 Other specified soft tissue disorders: Secondary | ICD-10-CM | POA: Diagnosis present

## 2019-11-29 DIAGNOSIS — E7800 Pure hypercholesterolemia, unspecified: Secondary | ICD-10-CM

## 2019-11-29 LAB — CBC WITH DIFFERENTIAL/PLATELET
Absolute Monocytes: 754 cells/uL (ref 200–950)
Basophils Absolute: 50 cells/uL (ref 0–200)
Basophils Relative: 0.9 %
Eosinophils Absolute: 231 cells/uL (ref 15–500)
Eosinophils Relative: 4.2 %
HCT: 37.5 % (ref 35.0–45.0)
Hemoglobin: 12.1 g/dL (ref 11.7–15.5)
Lymphs Abs: 2734 cells/uL (ref 850–3900)
MCH: 32.5 pg (ref 27.0–33.0)
MCHC: 32.3 g/dL (ref 32.0–36.0)
MCV: 100.8 fL — ABNORMAL HIGH (ref 80.0–100.0)
MPV: 11.7 fL (ref 7.5–12.5)
Monocytes Relative: 13.7 %
Neutro Abs: 1733 cells/uL (ref 1500–7800)
Neutrophils Relative %: 31.5 %
Platelets: 232 10*3/uL (ref 140–400)
RBC: 3.72 10*6/uL — ABNORMAL LOW (ref 3.80–5.10)
RDW: 12.2 % (ref 11.0–15.0)
Total Lymphocyte: 49.7 %
WBC: 5.5 10*3/uL (ref 3.8–10.8)

## 2019-11-29 LAB — COMPREHENSIVE METABOLIC PANEL
AG Ratio: 1.5 (calc) (ref 1.0–2.5)
ALT: 17 U/L (ref 6–29)
AST: 18 U/L (ref 10–35)
Albumin: 3.9 g/dL (ref 3.6–5.1)
Alkaline phosphatase (APISO): 70 U/L (ref 37–153)
BUN: 14 mg/dL (ref 7–25)
CO2: 28 mmol/L (ref 20–32)
Calcium: 9.4 mg/dL (ref 8.6–10.4)
Chloride: 105 mmol/L (ref 98–110)
Creat: 0.99 mg/dL (ref 0.50–1.05)
Globulin: 2.6 g/dL (calc) (ref 1.9–3.7)
Glucose, Bld: 89 mg/dL (ref 65–99)
Potassium: 4 mmol/L (ref 3.5–5.3)
Sodium: 139 mmol/L (ref 135–146)
Total Bilirubin: 0.3 mg/dL (ref 0.2–1.2)
Total Protein: 6.5 g/dL (ref 6.1–8.1)

## 2019-11-29 LAB — LIPID PANEL
Cholesterol: 233 mg/dL — ABNORMAL HIGH (ref <200)
HDL: 55 mg/dL (ref >=50)
LDL Cholesterol (Calc): 146 mg/dL (calc) — ABNORMAL HIGH
Non-HDL Cholesterol (Calc): 178 mg/dL (calc) — ABNORMAL HIGH (ref <130)
Total CHOL/HDL Ratio: 4.2 (calc) (ref <5.0)
Triglycerides: 186 mg/dL — ABNORMAL HIGH (ref <150)

## 2019-11-29 LAB — HEMOGLOBIN A1C
Hgb A1c MFr Bld: 5.4 % of total Hgb (ref <5.7)
Mean Plasma Glucose: 108 (calc)
eAG (mmol/L): 6 (calc)

## 2019-11-29 LAB — URIC ACID: Uric Acid, Serum: 4.4 mg/dL (ref 2.5–7.0)

## 2019-11-29 MED ORDER — PRAVASTATIN SODIUM 10 MG PO TABS: 10.0000 mg | ORAL_TABLET | Freq: Every day | ORAL | 1 refills | Status: DC

## 2019-11-29 NOTE — Telephone Encounter (Signed)
Erica calling from [facility] with preliminary report. Pt is Negative for DVT Right leg. Told her Danae Chen she can let pt go. Erica verbalized understanding.

## 2019-11-29 NOTE — Telephone Encounter (Signed)
See message.

## 2019-12-03 ENCOUNTER — Other Ambulatory Visit: Payer: Self-pay | Admitting: Neurology

## 2019-12-04 NOTE — Progress Notes (Deleted)
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  UPDATE: ***  Current NSAIDS: None Current analgesics: None Current triptans: Sumatriptan 100 mg Current ergotamine: None Current anti-emetic: None Current muscle relaxants: None Current anti-anxiolytic: Alprazolam Current sleep aide: Alprazolam Current Antihypertensive medications: None Current Antidepressant medications: None Current Anticonvulsant medications: Topiramate 200 mg at bedtime Current anti-CGRP: None Current Vitamins/Herbal/Supplements: None Current Antihistamines/Decongestants: None Other therapy: None Hormone/birth control:none  Caffeine: No Diet: Increased water, decrease chocolate Exercise: No Depression: Yes; Anxiety: Yes Other pain: She has total right hip replacement ***.  Hip pain persisted.  She had a NCV-EMG in September which was normal. Sleep hygiene: ***.  She has OSA.  CPAP???":  HISTORY: Onset: Around early 30s Location: Top of head, band-like distribution Quality: squeezing Initial Intensity: 7/10 (sometimes 10/10): She denies new headache, worst headache of her life or severe headache that wakes her up from sleep. Aura: no Prodrome: no Associated symptoms: Nausea, photophobia, phonophobia. She denies unilateral numbness or weakness. Initial Duration: 4 hours or sometimes up to a day Initial Frequency: 3 to 4 times a month Triggers:Tea Relieving factors: Sleep in dark Activity: Needs to lay down to sleep  Past NSAIDS: ibuprofen Past analgesics: Tylenol, Excedrin Past abortive triptans: sumatriptan 25mg  (does not recall ever taking) Past muscle relaxants:no  Past anti-emetic: Zofran 8mg , Phenergan Past sleep aide: melatonin, Ambien Past antihypertensive medications: no Past antidepressant medications: venlafaxine  XR 150mg  (for depression). Cymbalta (started briefly for two weeks) Past anticonvulsant medications: gabapentin 1200mg  three times daily, Lyrica 100mg  three times daily (both for neuropathic nerve pain from hip surgery)   Family history of headache: Mother and grandmother had migraines. Grandmother had aneurysm.  In 2017, she was reporting episodes of dizziness that occur suddenly, usually while walking. It is not positional. It is not spinning but rather a wave-like movement. There is no associated nausea, double vision, focal numbness. It lasts for a few minutes and resolves. It occurs twice a day. She takes meclizine, which is not too effective. CTA of head was unremarkable, revealing no vertebrobasilar insufficiency. Etiology did not seem neurologic.  MRI of brain without contrast from 06/05/15 and showed complex enlarged pineal gland over 1.9 cm with mild flattening of the superior colliculus. 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 Repeat MRI of the brain with and without contrast from 07/26/16 again demonstrated stable non-enhancing pineal gland cyst, but otherwise unremarkable except for some paranasal sinus disease.  PAST MEDICAL HISTORY: Past Medical History:  Diagnosis Date  . Anterolisthesis   . Anxiety    per pt, has panic attacks  . Arthritis    in back per pt.  . Arthritis of right hip   . Barrett's esophagus    egd 2007 + 2009 but none 2011 or 08/2010 egd  . Chronic headache   . Colon polyp    hyperplastic  . Depression    hx of suicide attempt in 2003  . Grief reaction   . Hidradenitis suppurativa   . IBS (irritable bowel syndrome)   . Migraine   . OSA (obstructive sleep apnea)   . Ovarian cyst   . Restless leg   . Tobacco abuse     MEDICATIONS: Current Outpatient Medications on File Prior to Visit  Medication Sig Dispense Refill  .  acetaminophen (TYLENOL) 500 MG tablet Take 3,000 mg by mouth.    . ARIPiprazole (ABILIFY) 5 MG tablet Take 1 tablet (5 mg total) by mouth daily. 30 tablet 2  . doxycycline (VIBRA-TABS) 100 MG tablet Take 1 tablet (100 mg total) by mouth 2 (two) times daily. 20 tablet 0  . DULoxetine (CYMBALTA) 60 MG capsule Take 1 capsule (60 mg total) by mouth daily. 30 capsule 2  . furosemide (LASIX) 20 MG tablet Take 1 tablet (20 mg total) by mouth daily as needed for fluid. 30 tablet 0  . HYDROmorphone (DILAUDID) 2 MG tablet Take 2 mg by mouth every 6 (six) hours as needed.    . lamoTRIgine (LAMICTAL) 150 MG tablet Take 1 tablet (150 mg total) by mouth daily. TAKE 1 TABLET BY MOUTH EVERY DAY 30 tablet 2  . LINZESS 145 MCG CAPS capsule Take 145 mcg by mouth daily.    . mupirocin ointment (BACTROBAN) 2 % Apply to affected area 1-2 times daily 22 g 0  . ondansetron (ZOFRAN) 4 MG tablet Take 4 mg by mouth every 8 (eight) hours.    . pantoprazole (PROTONIX) 40 MG tablet Take 1 tablet (40 mg total) by mouth daily. 90 tablet 3  . pravastatin (PRAVACHOL) 10 MG tablet Take 1 tablet (10 mg total) by mouth daily. 90 tablet 1  . rOPINIRole (REQUIP) 1 MG tablet TAKE 2 TABLETS (2 MG TOTAL) BY MOUTH AT BEDTIME. 180 tablet 1  . SUMAtriptan (IMITREX) 100 MG tablet TAKE 1 TABLET AT ONSET OF HEADACHE. MAY REPEAT IN 2 HOURS IF NEED. MAX 2 TABS IN 24 HOURS 9 tablet 2  . topiramate (TOPAMAX) 200 MG tablet TAKE 1 TABLET (200 MG TOTAL) BY MOUTH AT BEDTIME. 30 tablet 0  . traZODone (DESYREL) 50 MG tablet Take 1 tablet (50 mg total) by mouth at bedtime as needed for sleep. 30 tablet 2  . valACYclovir (VALTREX) 500 MG tablet TAKE 1 TABLET BY MOUTH EVERY DAY 90 tablet 0  . [DISCONTINUED] simvastatin (ZOCOR) 20 MG tablet TAKE 1 TABLET BY MOUTH EVERYDAY AT BEDTIME (Patient not taking: Reported on 11/28/2019) 90 tablet 0   No current facility-administered medications on file prior to visit.    ALLERGIES: Allergies  Allergen Reactions    . Morphine Hives and Nausea And Vomiting    INTOLERANCE >  N & V  . Penicillins Hives, Itching and Other (See Comments)     PATIENT HAS HAD A PCN REACTION WITH IMMEDIATE RASH, FACIAL/TONGUE/THROAT SWELLING, SOB, OR LIGHTHEADEDNESS WITH HYPOTENSION:  #  #  #  YES  #  #  #   Has patient had a PCN reaction causing severe rash involving mucus membranes or skin necrosis: Unknown Has patient had a PCN reaction that required hospitalization: No Has patient had a PCN reaction occurring within the last 10 years: No If all of the above answers are "NO", then may proceed with Cephalosporin use.     FAMILY HISTORY: Family History  Problem Relation Age of Onset  . COPD Mother   . Stroke Mother   . Coronary artery disease Mother        pacer  . Colon polyps Mother   . Breast cancer Mother   . Migraines Mother   . Other Mother        RSDS  . Rheum arthritis Mother   . Hypertension Father   . Colon polyps Brother   . Colon cancer Maternal Grandfather   .  Colon polyps Maternal Grandfather   . Breast cancer Maternal Aunt   . Uterine cancer Cousin        x 2  . Aneurysm Maternal Grandmother   . Kidney disease Brother   . Other Brother        back pain  . Kidney cancer Paternal Aunt   . Breast cancer Cousin   . Stomach cancer Neg Hx     SOCIAL HISTORY: Social History   Socioeconomic History  . Marital status: Divorced    Spouse name: Not on file  . Number of children: 2  . Years of education: Not on file  . Highest education level: Not on file  Occupational History  . Occupation: Bartender  Tobacco Use  . Smoking status: Former Smoker    Packs/day: 0.50    Years: 30.00    Pack years: 15.00    Types: Cigarettes    Quit date: 06/21/2015    Years since quitting: 4.4  . Smokeless tobacco: Never Used  Vaping Use  . Vaping Use: Never used  Substance and Sexual Activity  . Alcohol use: Yes    Alcohol/week: 0.0 standard drinks    Comment: 2 x a month  . Drug use: No  . Sexual  activity: Not on file  Other Topics Concern  . Not on file  Social History Narrative   Occupation: bartender   Divorced  -- currently single   Alcohol use-yes -x 5 per weekend   No longer a smoker   Social Determinants of Radio broadcast assistant Strain:   . Difficulty of Paying Living Expenses: Not on file  Food Insecurity:   . Worried About Charity fundraiser in the Last Year: Not on file  . Ran Out of Food in the Last Year: Not on file  Transportation Needs:   . Lack of Transportation (Medical): Not on file  . Lack of Transportation (Non-Medical): Not on file  Physical Activity:   . Days of Exercise per Week: Not on file  . Minutes of Exercise per Session: Not on file  Stress:   . Feeling of Stress : Not on file  Social Connections:   . Frequency of Communication with Friends and Family: Not on file  . Frequency of Social Gatherings with Friends and Family: Not on file  . Attends Religious Services: Not on file  . Active Member of Clubs or Organizations: Not on file  . Attends Archivist Meetings: Not on file  . Marital Status: Not on file  Intimate Partner Violence:   . Fear of Current or Ex-Partner: Not on file  . Emotionally Abused: Not on file  . Physically Abused: Not on file  . Sexually Abused: Not on file    PHYSICAL EXAM: *** General: No acute distress.  Patient appears well-groomed.   Head:  Normocephalic/atraumatic Eyes:  Fundi examined but not visualized Neck: supple, no paraspinal tenderness, full range of motion Heart:  Regular rate and rhythm Lungs:  Clear to auscultation bilaterally Back: No paraspinal tenderness Neurological Exam: alert and oriented to person, place, and time. Attention span and concentration intact, recent and remote memory intact, fund of knowledge intact.  Speech fluent and not dysarthric, language intact.  CN II-XII intact. Bulk and tone normal, muscle strength 5/5 throughout.  Sensation to light touch, temperature  and vibration intact.  Deep tendon reflexes 2+ throughout, toes downgoing.  Finger to nose and heel to shin testing intact.  Gait normal, Romberg negative.  IMPRESSION: 1.  Migraine without aura, without status migrainosus, not intractable 2.  Primary stabbing headache 3.  OSA  PLAN: ***  Metta Clines, DO  CC: ***

## 2019-12-06 ENCOUNTER — Encounter: Payer: Self-pay | Admitting: Physician Assistant

## 2019-12-06 ENCOUNTER — Ambulatory Visit: Payer: Self-pay | Admitting: Neurology

## 2019-12-17 ENCOUNTER — Other Ambulatory Visit: Payer: Self-pay | Admitting: Neurology

## 2019-12-17 NOTE — Telephone Encounter (Signed)
Pt has an appt in november

## 2019-12-20 ENCOUNTER — Other Ambulatory Visit: Payer: Self-pay | Admitting: Physician Assistant

## 2019-12-20 ENCOUNTER — Encounter: Payer: 59 | Admitting: Gastroenterology

## 2019-12-25 ENCOUNTER — Encounter (HOSPITAL_COMMUNITY): Payer: Self-pay | Admitting: Physician Assistant

## 2019-12-25 ENCOUNTER — Encounter: Payer: Self-pay | Admitting: Gastroenterology

## 2019-12-25 ENCOUNTER — Ambulatory Visit (INDEPENDENT_AMBULATORY_CARE_PROVIDER_SITE_OTHER): Payer: 59 | Admitting: Gastroenterology

## 2019-12-25 ENCOUNTER — Encounter: Payer: Self-pay | Admitting: *Deleted

## 2019-12-25 VITALS — BP 122/78 | HR 76 | Ht 66.0 in | Wt 242.0 lb

## 2019-12-25 DIAGNOSIS — R1013 Epigastric pain: Secondary | ICD-10-CM

## 2019-12-25 DIAGNOSIS — Z1212 Encounter for screening for malignant neoplasm of rectum: Secondary | ICD-10-CM

## 2019-12-25 DIAGNOSIS — Z1211 Encounter for screening for malignant neoplasm of colon: Secondary | ICD-10-CM

## 2019-12-25 DIAGNOSIS — K5904 Chronic idiopathic constipation: Secondary | ICD-10-CM

## 2019-12-25 DIAGNOSIS — K209 Esophagitis, unspecified without bleeding: Secondary | ICD-10-CM

## 2019-12-25 DIAGNOSIS — K219 Gastro-esophageal reflux disease without esophagitis: Secondary | ICD-10-CM | POA: Diagnosis not present

## 2019-12-25 MED ORDER — SUPREP BOWEL PREP KIT 17.5-3.13-1.6 GM/177ML PO SOLN: 1.0000 | Freq: Once | ORAL | 0 refills | Status: AC

## 2019-12-25 NOTE — Progress Notes (Signed)
[name] White    [id number]    August 02, 1965  Primary Care Physician:[place], [place], [place]  Referring Physician: Inda Coke, [address],  [place] [address]   Chief complaint: Epigastric abdominal pain, bloating, constipation  HPI:  54 year old very pleasant female here for follow-up visit for epigastric abdominal pain, abdominal bloating and constipation. She is taking Linzess 145 mg on average twice a week.  She has multiple bowel movements after she takes Linzess, has 5 or 6 semiformed watery bowel movements.  When she does not take it she does not have a bowel movement. She complains of mid and epigastric abdominal pain.  No vomiting but has intermittent nausea. No blood in stool.  EGD 08/23/2016: LA grade B esophagitis. Large amount of food in stomach  Gastric emptying scan 07/22/2016 31% emptied at 1 hr ( normal >= 10%) 69% emptied at 2 hr ( normal >= 40%) 95% emptied at 3 hr ( normal >= 70%) N/A emptied at 4 hr ( normal >= 90%) IMPRESSION: Normal gastric emptying study  Colonoscopy 09/04/2010:Normal    Outpatient Encounter Medications as of 12/25/2019  Medication Sig   ARIPiprazole (ABILIFY) 5 MG tablet Take 1 tablet (5 mg total) by mouth daily.   DULoxetine (CYMBALTA) 60 MG capsule Take 1 capsule (60 mg total) by mouth daily.   furosemide (LASIX) 20 MG tablet TAKE 1 TABLET BY MOUTH DAILY AS NEEDED FOR FLUID.   HYDROmorphone (DILAUDID) 2 MG tablet Take 2 mg by mouth every 6 (six) hours as needed.   lamoTRIgine (LAMICTAL) 150 MG tablet Take 1 tablet (150 mg total) by mouth daily. TAKE 1 TABLET BY MOUTH EVERY DAY   LINZESS 145 MCG CAPS capsule Take 145 mcg by mouth daily.   mupirocin ointment (BACTROBAN) 2 % Apply to affected area 1-2 times daily   ondansetron (ZOFRAN) 4 MG tablet Take 4 mg by mouth every 8 (eight) hours.   pantoprazole (PROTONIX) 40 MG tablet Take 1 tablet (40 mg total) by mouth daily.   pravastatin (PRAVACHOL)  10 MG tablet Take 1 tablet (10 mg total) by mouth daily.   rOPINIRole (REQUIP) 1 MG tablet TAKE 2 TABLETS (2 MG TOTAL) BY MOUTH AT BEDTIME.   SUMAtriptan (IMITREX) 100 MG tablet TAKE 1 TABLET AT ONSET OF HEADACHE. MAY REPEAT IN 2 HOURS IF NEED. MAX 2 TABS IN 24 HOURS   topiramate (TOPAMAX) 200 MG tablet TAKE 1 TABLET BY MOUTH AT BEDTIME.   traZODone (DESYREL) 50 MG tablet Take 1 tablet (50 mg total) by mouth at bedtime as needed for sleep.   valACYclovir (VALTREX) 500 MG tablet TAKE 1 TABLET BY MOUTH EVERY DAY   [DISCONTINUED] acetaminophen (TYLENOL) 500 MG tablet Take 3,000 mg by mouth.   [DISCONTINUED] doxycycline (VIBRA-TABS) 100 MG tablet Take 1 tablet (100 mg total) by mouth 2 (two) times daily.   [DISCONTINUED] simvastatin (ZOCOR) 20 MG tablet TAKE 1 TABLET BY MOUTH EVERYDAY AT BEDTIME (Patient not taking: Reported on 11/28/2019)   No facility-administered encounter medications on file as of 12/25/2019.    Allergies as of 12/25/2019 - Review Complete 12/25/2019  Allergen Reaction Noted   Morphine Hives and Nausea And Vomiting 07/07/2012   Penicillins Hives, Itching, and Other (See Comments) 10/04/2008    Past Medical History:  Diagnosis Date   Anterolisthesis    Anxiety    per pt, has panic attacks   Arthritis    in back per pt.   Arthritis  of right hip    Barrett's esophagus    egd 2007 + 2009 but none 2011 or 08/2010 egd   Chronic headache    Colon polyp    hyperplastic   Depression    hx of suicide attempt in 2003   Grief reaction    Hidradenitis suppurativa    IBS (irritable bowel syndrome)    Migraine    OSA (obstructive sleep apnea)    Ovarian cyst    Restless leg    Tobacco abuse     Past Surgical History:  Procedure Laterality Date   ABDOMINAL HYSTERECTOMY     secondary to endometriosis   CESAREAN SECTION     x 2   CHOLECYSTECTOMY     COLONOSCOPY W/ POLYPECTOMY  2012   ELBOW SURGERY     left   LAPAROSCOPY  04-22-10    lysis of adhesions   LAPAROSCOPY  04-30-10    with mini laparotomy and cystotomy repair   plantar fascitis     right foot   SALPINGECTOMY  04-22-10   right   TOTAL HIP ARTHROPLASTY Right 04/25/2017   Procedure: RIGHT TOTAL HIP ARTHROPLASTY ANTERIOR APPROACH;  Surgeon: Rod Can, MD;  Location: [facility];  Service: Orthopedics;  Laterality: Right;  Needs RNFA   TUBAL LIGATION     WRIST SURGERY     right/ganglion cyst    Family History  Problem Relation Age of Onset   COPD Mother    Stroke Mother    Coronary artery disease Mother        pacer   Colon polyps Mother    Breast cancer Mother    Migraines Mother    Other Mother        RSDS   Rheum arthritis Mother    Hypertension Father    Colon polyps Brother    Colon cancer Maternal Grandfather    Colon polyps Maternal Grandfather    Breast cancer Maternal Aunt    Uterine cancer Cousin        x 2   Aneurysm Maternal Grandmother    Kidney disease Brother    Other Brother        back pain   Kidney cancer Paternal Aunt    Breast cancer Cousin    Stomach cancer Neg Hx     Social History   Socioeconomic History   Marital status: Divorced    Spouse name: Not on file   Number of children: 2   Years of education: Not on file   Highest education level: Not on file  Occupational History   Occupation: Bartender  Tobacco Use   Smoking status: Former Smoker    Packs/day: 0.50    Years: 30.00    Pack years: 15.00    Types: Cigarettes    Quit date: 06/21/2015    Years since quitting: 4.5   Smokeless tobacco: Never Used  Vaping Use   Vaping Use: Never used  Substance and Sexual Activity   Alcohol use: Yes    Alcohol/week: 0.0 standard drinks    Comment: 2 x a month   Drug use: No   Sexual activity: Yes  Other Topics Concern   Not on file  Social History Narrative   Occupation: bartender   Divorced  -- currently single   Alcohol use-yes -x 5 per weekend   No longer a smoker    Social Determinants of Health   Financial Resource Strain:    Difficulty of Paying Living Expenses: Not on file  Food Insecurity:    Worried About Charity fundraiser in the Last Year: Not on file   [employer] of Food in the Last Year: Not on file  Transportation Needs:    Lack of Transportation (Medical): Not on file   Lack of Transportation (Non-Medical): Not on file  Physical Activity:    Days of Exercise per Week: Not on file   Minutes of Exercise per Session: Not on file  Stress:    Feeling of Stress : Not on file  Social Connections:    Frequency of Communication with Friends and Family: Not on file   Frequency of Social Gatherings with Friends and Family: Not on file   Attends Religious Services: Not on file   Active Member of Clubs or Organizations: Not on file   Attends Archivist Meetings: Not on file   Marital Status: Not on file  Intimate Partner Violence:    Fear of Current or Ex-Partner: Not on file   Emotionally Abused: Not on file   Physically Abused: Not on file   Sexually Abused: Not on file      Review of systems: All other review of systems negative except as mentioned in the HPI.   Physical Exam: Vitals:   12/25/19 0956  BP: 122/78  Pulse: 76   Body mass index is 39.06 kg/m. Gen:      No acute distress HEENT:  sclera anicteric Abd:      soft, non-tender; no palpable masses, no distension Ext:    No edema Neuro: alert and oriented x 3 Psych: normal mood and affect  Data Reviewed:  Reviewed labs, radiology imaging, old records and pertinent past GI work up   Assessment and Plan/Recommendations:  54 year old very pleasant female with chronic GERD, erosive esophagitis with complaints of epigastric abdominal pain, abdominal bloating and constipation  We will proceed with EGD and colonoscopy for evaluation She is due for colorectal cancer screening in 6 months.  Will do colonoscopy earlier given her persistent  symptoms, constipation and abdominal pain  Last EGD with erosive esophagitis and retained food in the stomach.  Gastric emptying scan showed normal emptying.  Repeat EGD to document healing of erosive esophagitis and exclude peptic ulcer disease  Continue Protonix and antireflux measures for GERD  Chronic idiopathic constipation: Decrease Linzess to 72 mcg daily, patient was provided samples.  If no improvement with 72 mcg daily and advised patient to go back to 145 mcg daily and use it every other day.  Also advised her to take it on empty stomach before meals.  Use Zofran 4 mg daily as needed for nausea  This visit required >40 minutes of patient care (this includes precharting, chart review, review of results, face-to-face time used for counseling as well as treatment plan and follow-up. The patient was provided an opportunity to ask questions and all were answered. The patient agreed with the plan and demonstrated an understanding of the instructions.  Damaris Hippo , MD    CC: Inda Coke, [place]

## 2019-12-25 NOTE — Patient Instructions (Addendum)
You have been scheduled for an endoscopy and colonoscopy. Please follow the written instructions given to you at your visit today. Please pick up your prep supplies at the pharmacy within the next 1-3 days. If you use inhalers (even only as needed), please bring them with you on the day of your procedure.   Try Linzess samples 72 mcg daily, if not working take the Linzess 124 mcg every other day before meals on an empty stomach  We will refill Protonix for you today  Take Zofran 4 mg daily as needed for bowel prep, Take 1 Zofran 1 tablet 30 minutes before the start of your prep the night before and 1 tablet 30 minutes the morning of your procedure 30 minutes before the start of your prep  If you are age 66 or older, your body mass index should be between 23-30. Your Body mass index is 39.06 kg/m. If this is out of the aforementioned range listed, please consider follow up with your Primary Care Provider.  If you are age 22 or younger, your body mass index should be between 19-25. Your Body mass index is 39.06 kg/m. If this is out of the aformentioned range listed, please consider follow up with your Primary Care Provider.    Due to recent changes in healthcare laws, you may see the results of your imaging and laboratory studies on MyChart before your provider has had a chance to review them.  We understand that in some cases there may be results that are confusing or concerning to you. Not all laboratory results come back in the same time frame and the provider may be waiting for multiple results in order to interpret others.  Please give [place] 48 hours in order for your provider to thoroughly review all the results before contacting the office for clarification of your results.   Thank you for choosing [facility]  Karleen Hampshire [place],MD

## 2020-01-04 ENCOUNTER — Telehealth (HOSPITAL_COMMUNITY): Payer: Self-pay | Admitting: Physician Assistant

## 2020-01-04 NOTE — Telephone Encounter (Signed)
Just an FYI. We have made several attempts to contact this patient including sending a letter to schedule or reschedule their echocardiogram. We will be removing the patient from the echo [place].   12/25/19 Called and VM is full @ 9:33 -MAILED LETTER/[facility]  12/11/19 Called and no VM available due to full @ 10:10/[facility]  12/11/19 Nile Riggs, CMA [facility]       Thank you

## 2020-01-07 NOTE — Progress Notes (Signed)
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  *  Intensity:  Severe Duration:  4 hours with sumatriptan Frequency:  4 a month Current NSAIDS: None Current analgesics: Dilaudid Current triptans: Sumatriptan 100 mg Current ergotamine: None Current anti-emetic: Zofran Current muscle relaxants: None Current anti-anxiolytic: none Current sleep aide: trazodone Current Antihypertensive medications: None Current Antidepressant medications: Cymbalta 60mg  daily, trazodone Current Anticonvulsant medications: Topiramate 200 mg at bedtime, lamotrigine  Current anti-CGRP: None Current Vitamins/Herbal/Supplements: None Current Antihistamines/Decongestants: None Other therapy: None Hormone/birth control:none Other meds:  Abilify, ropinirole  Caffeine: No Diet: Increased water, decrease chocolate Exercise: No Depression: Yes; Anxiety: Yes Other pain: Back and hip pain Sleep hygiene: Poor.  She has OSA but not using a CPAP.for about 5 months.    HISTORY: Onset: Around early 37s Location: Top of head, band-like distribution Quality: squeezing Initial Intensity: 7/10 (sometimes 10/10): She denies new headache, worst headache of her life or severe headache that wakes her up from sleep. Aura: no Prodrome: no Associated symptoms: Nausea, photophobia, phonophobia. She denies unilateral numbness or weakness. Initial Duration: 4 hours or sometimes up to a day Initial Frequency: 3 to 4 times a month Triggers:Tea Relieving factors: Sleep in dark Activity: Needs to lay down to sleep  Past NSAIDS: ibuprofen Past analgesics: Tylenol, Excedrin Past abortive triptans: none Past muscle relaxants:no  Past anti-emetic: Zofran 8mg , Phenergan Past sleep aide: melatonin,  Ambien Past antihypertensive medications: no Past antidepressant medications: venlafaxine XR 150mg  (for depression).  Past anticonvulsant medications: gabapentin 1200mg  three times daily, Lyrica 100mg  three times daily (both for neuropathic nerve pain from hip surgery)   Family history of headache: Mother and grandmother had migraines. Grandmother had aneurysm.  In 2017, she was reporting episodes of dizziness that occur suddenly, usually while walking. It is not positional. It is not spinning but rather a wave-like movement. There is no associated nausea, double vision, focal numbness. It lasts for a few minutes and resolves. It occurs twice a day. She takes meclizine, which is not too effective. CTA of head was unremarkable, revealing no vertebrobasilar insufficiency. Etiology did not seem neurologic.  MRI of brain without contrast from 06/05/15 and showed complex enlarged pineal gland over 1.9 cm with mild flattening of the superior colliculus. 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 Repeat MRI of the brain with and without contrast from 07/26/16 again demonstrated stable non-enhancing pineal gland cyst, but otherwise unremarkable except for some paranasal sinus disease.  PAST MEDICAL HISTORY: Past Medical History:  Diagnosis Date  . Anterolisthesis   . Anxiety    per pt, has panic attacks  . Arthritis    in back per pt.  . Arthritis of right hip   . Barrett's esophagus    egd 2007 + 2009 but none 2011 or 08/2010 egd  . Chronic headache   . Colon polyp    hyperplastic  . Depression    hx of suicide attempt in 2003  . Grief reaction   . Hidradenitis suppurativa   . IBS (irritable bowel syndrome)   . Migraine   . OSA (obstructive sleep apnea)   . Ovarian cyst   . Restless leg   . Tobacco  abuse     MEDICATIONS: Current Outpatient Medications on File  Prior to Visit  Medication Sig Dispense Refill  . ARIPiprazole (ABILIFY) 5 MG tablet Take 1 tablet (5 mg total) by mouth daily. 30 tablet 2  . DULoxetine (CYMBALTA) 60 MG capsule Take 1 capsule (60 mg total) by mouth daily. 30 capsule 2  . furosemide (LASIX) 20 MG tablet TAKE 1 TABLET BY MOUTH DAILY AS NEEDED FOR FLUID. 30 tablet 0  . HYDROmorphone (DILAUDID) 2 MG tablet Take 2 mg by mouth every 6 (six) hours as needed.    . lamoTRIgine (LAMICTAL) 150 MG tablet Take 1 tablet (150 mg total) by mouth daily. TAKE 1 TABLET BY MOUTH EVERY DAY 30 tablet 2  . LINZESS 145 MCG CAPS capsule Take 145 mcg by mouth daily.    . mupirocin ointment (BACTROBAN) 2 % Apply to affected area 1-2 times daily 22 g 0  . ondansetron (ZOFRAN) 4 MG tablet Take 4 mg by mouth every 8 (eight) hours.    . pantoprazole (PROTONIX) 40 MG tablet Take 1 tablet (40 mg total) by mouth daily. 90 tablet 3  . pravastatin (PRAVACHOL) 10 MG tablet Take 1 tablet (10 mg total) by mouth daily. 90 tablet 1  . rOPINIRole (REQUIP) 1 MG tablet TAKE 2 TABLETS (2 MG TOTAL) BY MOUTH AT BEDTIME. 180 tablet 1  . SUMAtriptan (IMITREX) 100 MG tablet TAKE 1 TABLET AT ONSET OF HEADACHE. MAY REPEAT IN 2 HOURS IF NEED. MAX 2 TABS IN 24 HOURS 9 tablet 2  . topiramate (TOPAMAX) 200 MG tablet TAKE 1 TABLET BY MOUTH AT BEDTIME. 90 tablet 0  . traZODone (DESYREL) 50 MG tablet Take 1 tablet (50 mg total) by mouth at bedtime as needed for sleep. 30 tablet 2  . valACYclovir (VALTREX) 500 MG tablet TAKE 1 TABLET BY MOUTH EVERY DAY 90 tablet 0  . [DISCONTINUED] simvastatin (ZOCOR) 20 MG tablet TAKE 1 TABLET BY MOUTH EVERYDAY AT BEDTIME (Patient not taking: Reported on 11/28/2019) 90 tablet 0   No current facility-administered medications on file prior to visit.    ALLERGIES: Allergies  Allergen Reactions  . Morphine Hives and Nausea And Vomiting    INTOLERANCE >  N & V  . Penicillins Hives, Itching and Other (See Comments)     PATIENT HAS HAD A PCN REACTION  WITH IMMEDIATE RASH, FACIAL/TONGUE/THROAT SWELLING, SOB, OR LIGHTHEADEDNESS WITH HYPOTENSION:  #  #  #  YES  #  #  #   Has patient had a PCN reaction causing severe rash involving mucus membranes or skin necrosis: Unknown Has patient had a PCN reaction that required hospitalization: No Has patient had a PCN reaction occurring within the last 10 years: No If all of the above answers are "NO", then may proceed with Cephalosporin use.     FAMILY HISTORY: Family History  Problem Relation Age of Onset  . COPD Mother   . Stroke Mother   . Coronary artery disease Mother        pacer  . Colon polyps Mother   . Breast cancer Mother   . Migraines Mother   . Other Mother        RSDS  . Rheum arthritis Mother   . Hypertension Father   . Colon polyps Brother   . Colon cancer Maternal Grandfather   . Colon polyps Maternal Grandfather   . Breast cancer Maternal Aunt   . Uterine cancer Cousin        x 2  .  Aneurysm Maternal Grandmother   . Kidney disease Brother   . Other Brother        back pain  . Kidney cancer Paternal Aunt   . Breast cancer Cousin   . Stomach cancer Neg Hx     SOCIAL HISTORY: Social History   Socioeconomic History  . Marital status: Divorced    Spouse name: Not on file  . Number of children: 2  . Years of education: Not on file  . Highest education level: Not on file  Occupational History  . Occupation: Bartender  Tobacco Use  . Smoking status: Former Smoker    Packs/day: 0.50    Years: 30.00    Pack years: 15.00    Types: Cigarettes    Quit date: 06/21/2015    Years since quitting: 4.5  . Smokeless tobacco: Never Used  Vaping Use  . Vaping Use: Never used  Substance and Sexual Activity  . Alcohol use: Yes    Alcohol/week: 0.0 standard drinks    Comment: 2 x a month  . Drug use: No  . Sexual activity: Yes  Other Topics Concern  . Not on file  Social History Narrative   Occupation: bartender   Divorced  -- currently single   Alcohol use-yes -x  5 per weekend   No longer a smoker   Social Determinants of Radio broadcast assistant Strain:   . Difficulty of Paying Living Expenses: Not on file  Food Insecurity:   . Worried About Charity fundraiser in the Last Year: Not on file  . Ran Out of Food in the Last Year: Not on file  Transportation Needs:   . Lack of Transportation (Medical): Not on file  . Lack of Transportation (Non-Medical): Not on file  Physical Activity:   . Days of Exercise per Week: Not on file  . Minutes of Exercise per Session: Not on file  Stress:   . Feeling of Stress : Not on file  Social Connections:   . Frequency of Communication with Friends and Family: Not on file  . Frequency of Social Gatherings with Friends and Family: Not on file  . Attends Religious Services: Not on file  . Active Member of Clubs or Organizations: Not on file  . Attends Archivist Meetings: Not on file  . Marital Status: Not on file  Intimate Partner Violence:   . Fear of Current or Ex-Partner: Not on file  . Emotionally Abused: Not on file  . Physically Abused: Not on file  . Sexually Abused: Not on file     Objective:  Blood pressure 113/77, pulse 90, height 5\' 5"  (1.651 m), weight 244 lb 6.4 oz (110.9 kg), SpO2 96 %. General: No acute distress.  Patient appears well-groomed.   Head:  Normocephalic/atraumatic Eyes:  Fundi examined but not visualized Neck: supple, no paraspinal tenderness, full range of motion Heart:  Regular rate and rhythm Lungs:  Clear to auscultation bilaterally Back: No paraspinal tenderness Neurological Exam: alert and oriented to person, place, and time. Attention span and concentration intact, recent and remote memory intact, fund of knowledge intact.  Speech fluent and not dysarthric, language intact.  CN II-XII intact. Bulk and tone normal, muscle strength 5/5 throughout.  Sensation to light touch, temperature and vibration intact.  Deep tendon reflexes 2+ throughout, toes downgoing.   Finger to nose and heel to shin testing intact.  Gait normal, Romberg negative.   Assessment/Plan:   1.  Migraine without aura, without  status migrainosus, not intractable, much improved with CPAP 2.  OSA on CPAP   1.  Migraine prevention:  Topiramate 200mg  at bedtime 2.  Migraine rescue:  Will try Maxalt MLT 10mg .  Zofran for nausea. 3.  Limit use of pain relievers to no more than 2 days out of week to prevent risk of rebound or medication-overuse headache. 4.  Keep headache diary 5.  Use CPAP 6.  Follow up one year  Metta Clines, DO  CC: Inda Coke, PA

## 2020-01-08 ENCOUNTER — Other Ambulatory Visit: Payer: Self-pay

## 2020-01-08 ENCOUNTER — Ambulatory Visit (INDEPENDENT_AMBULATORY_CARE_PROVIDER_SITE_OTHER): Payer: 59 | Admitting: Neurology

## 2020-01-08 ENCOUNTER — Encounter: Payer: Self-pay | Admitting: Neurology

## 2020-01-08 VITALS — BP 113/77 | HR 90 | Ht 65.0 in | Wt 244.4 lb

## 2020-01-08 DIAGNOSIS — G4733 Obstructive sleep apnea (adult) (pediatric): Secondary | ICD-10-CM | POA: Diagnosis not present

## 2020-01-08 DIAGNOSIS — G43009 Migraine without aura, not intractable, without status migrainosus: Secondary | ICD-10-CM

## 2020-01-08 MED ORDER — RIZATRIPTAN BENZOATE 10 MG PO TBDP
ORAL_TABLET | ORAL | 5 refills | Status: DC
Start: 2020-01-08 — End: 2020-05-13

## 2020-01-08 NOTE — Patient Instructions (Signed)
  1. Continue topiramate 200mg  at bedtime 2. Stop sumatriptan.  Take rizatriptan 10mg  at earliest onset of headache.  May repeat dose once in 2 hours if needed.  Maximum 2 tablets in 24 hours.  Use Zofran for nausea. 3. Limit use of pain relievers to no more than 2 days out of the week.  These medications include acetaminophen, NSAIDs (ibuprofen/Advil/Motrin, naproxen/Aleve, triptans (Imitrex/sumatriptan), Excedrin, and narcotics.  This will help reduce risk of rebound headaches. 4. Be aware of common food triggers:  - Caffeine:  coffee, black tea, cola, Mt. Dew  - Chocolate  - Dairy:  aged cheeses (brie, blue, cheddar, gouda, [place], provolone, [place], Swiss, etc), chocolate milk, buttermilk, sour cream, limit eggs and yogurt  - Nuts, peanut butter  - Alcohol  - Cereals/grains:  FRESH breads (fresh bagels, sourdough, doughnuts), yeast productions  - Processed/canned/aged/cured meats (pre-packaged deli meats, hotdogs)  - MSG/glutamate:  soy sauce, flavor enhancer, pickled/preserved/marinated foods  - Sweeteners:  aspartame (Equal, Nutrasweet).  Sugar and Splenda are okay  - Vegetables:  legumes (lima beans, lentils, snow peas, fava beans, pinto peans, peas, garbanzo beans), sauerkraut, onions, olives, pickles  - Fruit:  avocados, bananas, citrus fruit (orange, lemon, grapefruit), mango  - Other:  Frozen meals, macaroni and cheese 5. Routine exercise 6. Stay adequately hydrated (aim for 64 oz water daily) 7. Keep headache diary 8. Maintain proper stress management 9. Maintain proper sleep hygiene 10. Do not skip meals 11. Consider supplements:  magnesium citrate 400mg  daily, riboflavin 400mg  daily, coenzyme Q10 100mg  three times daily.

## 2020-01-09 ENCOUNTER — Other Ambulatory Visit: Payer: Self-pay | Admitting: Neurology

## 2020-01-19 ENCOUNTER — Other Ambulatory Visit: Payer: Self-pay | Admitting: Physician Assistant

## 2020-01-24 ENCOUNTER — Telehealth (HOSPITAL_COMMUNITY): Payer: 59 | Admitting: Psychiatry

## 2020-01-30 ENCOUNTER — Other Ambulatory Visit: Payer: Self-pay | Admitting: Gastroenterology

## 2020-01-30 ENCOUNTER — Telehealth (HOSPITAL_COMMUNITY): Payer: 59 | Admitting: Psychiatry

## 2020-01-31 LAB — SARS CORONAVIRUS 2 (TAT 6-24 HRS): SARS Coronavirus 2: NEGATIVE

## 2020-02-01 ENCOUNTER — Other Ambulatory Visit: Payer: Self-pay

## 2020-02-01 ENCOUNTER — Ambulatory Visit (AMBULATORY_SURGERY_CENTER): Payer: 59 | Admitting: Gastroenterology

## 2020-02-01 ENCOUNTER — Encounter: Payer: Self-pay | Admitting: Gastroenterology

## 2020-02-01 VITALS — BP 123/72 | HR 72 | Temp 97.3°F | Resp 26 | Ht 66.0 in | Wt 242.0 lb

## 2020-02-01 DIAGNOSIS — K635 Polyp of colon: Secondary | ICD-10-CM | POA: Diagnosis not present

## 2020-02-01 DIAGNOSIS — K209 Esophagitis, unspecified without bleeding: Secondary | ICD-10-CM

## 2020-02-01 DIAGNOSIS — K219 Gastro-esophageal reflux disease without esophagitis: Secondary | ICD-10-CM

## 2020-02-01 DIAGNOSIS — K2270 Barrett's esophagus without dysplasia: Secondary | ICD-10-CM

## 2020-02-01 DIAGNOSIS — Z1211 Encounter for screening for malignant neoplasm of colon: Secondary | ICD-10-CM

## 2020-02-01 DIAGNOSIS — K5904 Chronic idiopathic constipation: Secondary | ICD-10-CM

## 2020-02-01 DIAGNOSIS — Z1212 Encounter for screening for malignant neoplasm of rectum: Secondary | ICD-10-CM

## 2020-02-01 MED ORDER — SODIUM CHLORIDE 0.9 % IV SOLN: 500.0000 mL | Freq: Once | INTRAVENOUS | Status: DC

## 2020-02-01 MED ORDER — DICYCLOMINE HCL 10 MG PO CAPS: 10.0000 mg | ORAL_CAPSULE | Freq: Three times a day (TID) | ORAL | 3 refills | Status: DC

## 2020-02-01 NOTE — Op Note (Signed)
[facility] Patient Name: [name] White Procedure Date: 02/01/2020 2:19 PM MRN: [id number] Endoscopist: Mauri Pole , MD Age: 54 Referring MD:  Date of Birth: [birth year] Gender: Female Account #: [id number] Procedure:                Colonoscopy Indications:              Screening for colorectal malignant neoplasm Medicines:                Monitored Anesthesia Care Procedure:                Pre-Anesthesia Assessment:                           - Prior to the procedure, a History and Physical                            was performed, and patient medications and                            allergies were reviewed. The patient's tolerance of                            previous anesthesia was also reviewed. The risks                            and benefits of the procedure and the sedation                            options and risks were discussed with the patient.                            All questions were answered, and informed consent                            was obtained. Prior Anticoagulants: The patient has                            taken no previous anticoagulant or antiplatelet                            agents. ASA Grade Assessment: II - A patient with                            mild systemic disease. After reviewing the risks                            and benefits, the patient was deemed in                            satisfactory condition to undergo the procedure.                           After obtaining informed consent, the colonoscope  was passed under direct vision. Throughout the                            procedure, the patient's blood pressure, pulse, and                            oxygen saturations were monitored continuously. The                            Colonoscope was introduced through the anus and                            advanced to the the cecum, identified by                            appendiceal orifice  and ileocecal valve. The                            colonoscopy was performed without difficulty. The                            patient tolerated the procedure well. The quality                            of the bowel preparation was excellent. The                            ileocecal valve, appendiceal orifice, and rectum                            were photographed. Scope In: 2:40:16 PM Scope Out: 2:56:26 PM Scope Withdrawal Time: 0 hours 12 minutes 24 seconds  Total Procedure Duration: 0 hours 16 minutes 10 seconds  Findings:                 The perianal and digital rectal examinations were                            normal.                           A 15 mm polyp was found in the appendiceal orifice.                            The polyp was sessile and mixed lateral spreading.                           Non-bleeding internal hemorrhoids were found during                            retroflexion. The hemorrhoids were small. Complications:            No immediate complications. Estimated Blood Loss:     Estimated blood loss was minimal. Impression:               - One 15 mm  polyp at the appendiceal orifice.                           - Non-bleeding internal hemorrhoids.                           - No specimens collected. Recommendation:           - Patient has a contact number available for                            emergencies. The signs and symptoms of potential                            delayed complications were discussed with the                            patient. Return to normal activities tomorrow.                            Written discharge instructions were provided to the                            patient.                           - Resume previous diet.                           - Continue present medications.                           - Repeat colonoscopy at the next available                            appointment for polypectomy with EMR at [facility]                             endoscopy unit, needs to be scheduled. Mauri Pole, MD 02/01/2020 3:13:10 PM This report has been signed electronically.

## 2020-02-01 NOTE — Progress Notes (Signed)
Called to room to assist during endoscopic procedure.  Patient ID and intended procedure confirmed with present staff. Received instructions for my participation in the procedure from the performing physician.  

## 2020-02-01 NOTE — Patient Instructions (Signed)
Resume previous diet Continue current medications Await pathology results Follow an antireflux regimen  YOU HAD AN ENDOSCOPIC PROCEDURE TODAY AT [facility]:   Refer to the procedure report that was given to you for any specific questions about what was found during the examination.  If the procedure report does not answer your questions, please call your gastroenterologist to clarify.  If you requested that your care partner not be given the details of your procedure findings, then the procedure report has been included in a sealed envelope for you to review at your convenience later.  YOU SHOULD EXPECT: Some feelings of bloating in the abdomen. Passage of more gas than usual.  Walking can help get rid of the air that was put into your GI tract during the procedure and reduce the bloating. If you had a lower endoscopy (such as a colonoscopy or flexible sigmoidoscopy) you may notice spotting of blood in your stool or on the toilet paper. If you underwent a bowel prep for your procedure, you may not have a normal bowel movement for a few days.  Please Note:  You might notice some irritation and congestion in your nose or some drainage.  This is from the oxygen used during your procedure.  There is no need for concern and it should clear up in a day or so.  SYMPTOMS TO REPORT IMMEDIATELY:   Following lower endoscopy (colonoscopy or flexible sigmoidoscopy):  Excessive amounts of blood in the stool  Significant tenderness or worsening of abdominal pains  Swelling of the abdomen that is new, acute  Fever of 100F or higher   Following upper endoscopy (EGD)  Vomiting of blood or coffee ground material  New chest pain or pain under the shoulder blades  Painful or persistently difficult swallowing  New shortness of breath  Fever of 100F or higher  Black, tarry-looking stools  For urgent or emergent issues, a gastroenterologist can be reached at any hour by calling [phone]  [phone]. Do not use MyChart messaging for urgent concerns.   DIET:  We do recommend a small meal at first, but then you may proceed to your regular diet.  Drink plenty of fluids but you should avoid alcoholic beverages for 24 hours.  ACTIVITY:  You should plan to take it easy for the rest of today and you should NOT DRIVE or use heavy machinery until tomorrow (because of the sedation medicines used during the test).    FOLLOW UP: Our staff will call the number listed on your records 48-72 hours following your procedure to check on you and address any questions or concerns that you may have regarding the information given to you following your procedure. If we do not reach you, we will leave a message.  We will attempt to reach you two times.  During this call, we will ask if you have developed any symptoms of COVID 19. If you develop any symptoms (ie: fever, flu-like symptoms, shortness of breath, cough etc.) before then, please call [phone].  If you test positive for Covid 19 in the 2 weeks post procedure, please call and report this information to [place].    If any biopsies were taken you will be contacted by phone or by letter within the next 1-3 weeks.  Please call us at [phone] if you have not heard about the biopsies in 3 weeks.   SIGNATURES/CONFIDENTIALITY: You and/or your care partner have signed paperwork which will be entered into your electronic medical record.  These signatures attest to the fact that that the information above on your After Visit Summary has been reviewed and is understood.  Full responsibility of the confidentiality of this discharge information lies with you and/or your care-partner.

## 2020-02-01 NOTE — Progress Notes (Signed)
Medical history reviewed with no changes noted. VS assessed by C.W 

## 2020-02-01 NOTE — Progress Notes (Signed)
PT taken to PACU. Monitors in place. VSS. Report given to RN. 

## 2020-02-01 NOTE — Op Note (Signed)
[facility] Patient Name: [name] White Procedure Date: 02/01/2020 2:20 PM MRN: [id number] Endoscopist: Mauri Pole , MD Age: 54 Referring MD:  Date of Birth: 05-05-1965 Gender: Female Account #: [id number] Procedure:                Upper GI endoscopy Indications:              Epigastric abdominal pain, Dyspepsia, Follow-up of                            reflux esophagitis Medicines:                Monitored Anesthesia Care Procedure:                Pre-Anesthesia Assessment:                           - Prior to the procedure, a History and Physical                            was performed, and patient medications and                            allergies were reviewed. The patient's tolerance of                            previous anesthesia was also reviewed. The risks                            and benefits of the procedure and the sedation                            options and risks were discussed with the patient.                            All questions were answered, and informed consent                            was obtained. Prior Anticoagulants: The patient has                            taken no previous anticoagulant or antiplatelet                            agents. ASA Grade Assessment: II - A patient with                            mild systemic disease. After reviewing the risks                            and benefits, the patient was deemed in                            satisfactory condition to undergo the procedure.  After obtaining informed consent, the endoscope was                            passed under direct vision. Throughout the                            procedure, the patient's blood pressure, pulse, and                            oxygen saturations were monitored continuously. The                            Endoscope was introduced through the mouth, and                            advanced to the second part of  duodenum. The upper                            GI endoscopy was accomplished without difficulty.                            The patient tolerated the procedure well. Scope In: Scope Out: Findings:                 There were esophageal mucosal changes suspicious                            for short-segment Barrett's esophagus present in                            the lower third of the esophagus. The maximum                            longitudinal extent of these mucosal changes was 3                            cm in length. Mucosa was biopsied with a cold                            forceps for histology in a targeted manner at                            intervals of 1 cm in the lower third of the                            esophagus. One specimen bottle was sent to                            pathology.                           Patchy mild inflammation characterized by  congestion (edema) and erythema was found in the                            entire examined stomach.                           The examined duodenum was normal. Complications:            No immediate complications. Estimated Blood Loss:     Estimated blood loss was minimal. Impression:               - Esophageal mucosal changes suspicious for                            short-segment Barrett's esophagus. Biopsied.                           - Gastritis.                           - Normal examined duodenum. Recommendation:           - Patient has a contact number available for                            emergencies. The signs and symptoms of potential                            delayed complications were discussed with the                            patient. Return to normal activities tomorrow.                            Written discharge instructions were provided to the                            patient.                           - Resume previous diet.                           - Continue  present medications.                           - Await pathology results.                           - Follow an antireflux regimen.                           - Use Protonix (pantoprazole) 40 mg PO daily. Mauri Pole, MD 02/01/2020 3:07:54 PM This report has been signed electronically.

## 2020-02-04 ENCOUNTER — Other Ambulatory Visit: Payer: Self-pay

## 2020-02-04 ENCOUNTER — Telehealth (INDEPENDENT_AMBULATORY_CARE_PROVIDER_SITE_OTHER): Payer: 59 | Admitting: Psychiatry

## 2020-02-04 ENCOUNTER — Encounter (HOSPITAL_COMMUNITY): Payer: Self-pay | Admitting: Psychiatry

## 2020-02-04 DIAGNOSIS — F4310 Post-traumatic stress disorder, unspecified: Secondary | ICD-10-CM | POA: Diagnosis not present

## 2020-02-04 DIAGNOSIS — F319 Bipolar disorder, unspecified: Secondary | ICD-10-CM

## 2020-02-04 MED ORDER — TRAZODONE HCL 50 MG PO TABS: 50.0000 mg | ORAL_TABLET | Freq: Every evening | ORAL | 2 refills | Status: DC | PRN

## 2020-02-04 MED ORDER — LAMOTRIGINE 150 MG PO TABS
150.0000 mg | ORAL_TABLET | Freq: Every day | ORAL | 2 refills | Status: DC
Start: 2020-02-04 — End: 2020-04-29

## 2020-02-04 MED ORDER — DULOXETINE HCL 60 MG PO CPEP: 60.0000 mg | ORAL_CAPSULE | Freq: Every day | ORAL | 2 refills | Status: DC

## 2020-02-04 MED ORDER — ARIPIPRAZOLE 5 MG PO TABS: 5.0000 mg | ORAL_TABLET | Freq: Every day | ORAL | 2 refills | Status: DC
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
## 2020-02-05 ENCOUNTER — Telehealth: Payer: Self-pay

## 2020-02-05 NOTE — Telephone Encounter (Signed)
  Follow up Call-  Call back number 02/01/2020  Post procedure Call Back phone  # [phone]  Permission to leave phone message Yes  Some recent data might be hidden     Patient questions:  Do you have a fever, pain , or abdominal swelling? No. Pain Score  0 *  Have you tolerated food without any problems? Yes.    Have you been able to return to your normal activities? Yes.    Do you have any questions about your discharge instructions: Diet   No. Medications  No. Follow up visit  No.  Do you have questions or concerns about your Care? No.  Actions: * If pain score is 4 or above: No action needed, pain <4. 1. Have you developed a fever since your procedure? no  2.   Have you had an respiratory symptoms (SOB or cough) since your procedure? no  3.   Have you tested positive for COVID 19 since your procedure no  4.   Have you had any family members/close contacts diagnosed with the COVID 19 since your procedure?  no   If yes to any of these questions please route to Joylene John, RN and Joella Prince, RN

## 2020-02-12 ENCOUNTER — Encounter: Payer: Self-pay | Admitting: Gastroenterology

## 2020-02-15 ENCOUNTER — Other Ambulatory Visit: Payer: Self-pay | Admitting: Physician Assistant

## 2020-02-19 ENCOUNTER — Telehealth: Payer: Self-pay

## 2020-02-19 NOTE — Telephone Encounter (Signed)
-----   Message from Lemar Lofty., MD sent at 02/19/2020  7:12 AM EST ----- KVN, Happy to be of assistance in this [place].  Lets get her set up for a clinic visit and Latresha Yahr, if she wants to go ahead and get scheduled for a procedure we can go ahead and schedule that a few weeks out after the clinic visit otherwise can discuss and schedule at the clinic visit.  Thanks. 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  Please let me know if you can schedule her for EMR.   Thank you VN

## 2020-02-19 NOTE — Telephone Encounter (Signed)
The pt has been advised and agrees to appt with Dr Rhetta Mura to discuss the EMR.  She will proceed with picking a date for procedure at that appt.

## 2020-02-28 ENCOUNTER — Encounter: Payer: Self-pay | Admitting: Physician Assistant

## 2020-03-11 ENCOUNTER — Other Ambulatory Visit (HOSPITAL_COMMUNITY): Payer: Self-pay | Admitting: Psychiatry

## 2020-03-11 DIAGNOSIS — F4310 Post-traumatic stress disorder, unspecified: Secondary | ICD-10-CM

## 2020-03-15 ENCOUNTER — Other Ambulatory Visit: Payer: Self-pay | Admitting: Physician Assistant

## 2020-03-20 ENCOUNTER — Telehealth: Payer: Self-pay | Admitting: Neurology

## 2020-03-20 MED ORDER — TOPIRAMATE 200 MG PO TABS: 200.0000 mg | ORAL_TABLET | Freq: Every day | ORAL | 1 refills | Status: DC

## 2020-03-20 NOTE — Telephone Encounter (Signed)
Patient left message with accessnurse 03/20/20 @ 12:52pm: "Caller states she is out of her medication."

## 2020-04-01 ENCOUNTER — Ambulatory Visit (INDEPENDENT_AMBULATORY_CARE_PROVIDER_SITE_OTHER): Payer: 59 | Admitting: Gastroenterology

## 2020-04-01 ENCOUNTER — Other Ambulatory Visit: Payer: Self-pay

## 2020-04-01 ENCOUNTER — Encounter: Payer: Self-pay | Admitting: Gastroenterology

## 2020-04-01 ENCOUNTER — Other Ambulatory Visit (INDEPENDENT_AMBULATORY_CARE_PROVIDER_SITE_OTHER): Payer: 59

## 2020-04-01 VITALS — BP 110/70 | HR 83 | Ht 66.0 in | Wt 241.0 lb

## 2020-04-01 DIAGNOSIS — K635 Polyp of colon: Secondary | ICD-10-CM

## 2020-04-01 DIAGNOSIS — Z8371 Family history of colonic polyps: Secondary | ICD-10-CM

## 2020-04-01 DIAGNOSIS — R933 Abnormal findings on diagnostic imaging of other parts of digestive tract: Secondary | ICD-10-CM

## 2020-04-01 LAB — BASIC METABOLIC PANEL
BUN: 13 mg/dL (ref 6–23)
CO2: 30 mEq/L (ref 19–32)
Calcium: 9.4 mg/dL (ref 8.4–10.5)
Chloride: 105 mEq/L (ref 96–112)
Creatinine, Ser: 0.89 mg/dL (ref 0.40–1.20)
GFR: 72.97 mL/min (ref >60.00)
Glucose, Bld: 98 mg/dL (ref 70–99)
Potassium: 3.8 mEq/L (ref 3.5–5.1)
Sodium: 139 mEq/L (ref 135–145)

## 2020-04-01 LAB — PROTIME-INR
INR: 1 ratio (ref 0.8–1.0)
Prothrombin Time: 10.9 s (ref 9.6–13.1)

## 2020-04-01 LAB — CBC
HCT: 41.2 % (ref 36.0–46.0)
Hemoglobin: 13.9 g/dL (ref 12.0–15.0)
MCHC: 33.6 g/dL (ref 30.0–36.0)
MCV: 96.2 fl (ref 78.0–100.0)
Platelets: 218 10*3/uL (ref 150.0–400.0)
RBC: 4.29 Mil/uL (ref 3.87–5.11)
RDW: 13.6 % (ref 11.5–15.5)
WBC: 7.5 10*3/uL (ref 4.0–10.5)

## 2020-04-01 MED ORDER — SUPREP BOWEL PREP KIT 17.5-3.13-1.6 GM/177ML PO SOLN: 1.0000 | ORAL | 0 refills | Status: DC

## 2020-04-01 NOTE — Patient Instructions (Signed)
Your provider has requested that you go to the basement level for lab work before leaving today. Press "B" on the elevator. The lab is located at the first door on the left as you exit the elevator.  We have sent the following medications to your pharmacy for you to pick up at your convenience: Suprep   You have been scheduled for a colonoscopy. Please follow written instructions given to you at your visit today.  Please pick up your prep supplies at the pharmacy within the next 1-3 days. If you use inhalers (even only as needed), please bring them with you on the day of your procedure.  Due to recent changes in healthcare laws, you may see the results of your imaging and laboratory studies on MyChart before your provider has had a chance to review them.  We understand that in some cases there may be results that are confusing or concerning to you. Not all laboratory results come back in the same time frame and the provider may be waiting for multiple results in order to interpret others.  Please give us 48 hours in order for your provider to thoroughly review all the results before contacting the office for clarification of your results.   Thank you for choosing me and [facility].  Dr. Mansouraty  

## 2020-04-04 ENCOUNTER — Encounter: Payer: Self-pay | Admitting: Gastroenterology

## 2020-04-04 DIAGNOSIS — Z8371 Family history of colonic polyps: Secondary | ICD-10-CM | POA: Insufficient documentation

## 2020-04-04 DIAGNOSIS — R933 Abnormal findings on diagnostic imaging of other parts of digestive tract: Secondary | ICD-10-CM | POA: Insufficient documentation

## 2020-04-04 DIAGNOSIS — K635 Polyp of colon: Secondary | ICD-10-CM | POA: Insufficient documentation

## 2020-04-04 NOTE — H&P (View-Only) (Signed)
[facility] VISIT   Primary Care Provider [place], [place], [place] [address] [place] [address] [phone]  Referring Provider Dr. Silverio Decamp  Patient Profile: [name] White is a 55 y.o. female with a pmh significant for anxiety/MDD, bipolar disorder, arthritis hidradenitis suppurativa, migraines, OSA, cystocele, rectocele, status post hysterectomy, IBS-C/D, colon polyps, hemorrhoids.  The patient presents to the [facility] for an evaluation and management of problem(s) noted below:  Problem List 1. Cecal polyp   2. Abnormal colonoscopy   3. Family history of colonic polyps     History of Present Illness The patient recently underwent a screening colonoscopy. She was found to have a polyp that was adjacent to the appendix orifice but did not look to be involving it at that time. She is referred for consideration of endoscopic resection. Patient describes change in her GI issues for which she is seeing Dr. Silverio Decamp. She denies significant bleeding (melena/hematochezia/maroon stools). The patient's family history is significant for multiple members with colon polyps as well as a maternal grandfather who had colon cancer. The patient does not take significant nonsteroidals or BC/Goody powders.  GI Review of Systems Positive as above Negative for dysphagia, odynophagia, nausea, vomiting, change in bowel habits  Review of Systems General: Denies fevers/chills/weight loss unintentionally Cardiovascular: Denies chest pain Pulmonary: Denies shortness of breath Gastroenterological: See HPI Genitourinary: Denies darkened urine Hematological: Denies easy bruising/bleeding Dermatological: Denies jaundice Psychological: Mood is stable   Medications Current Outpatient Medications  Medication Sig Dispense Refill  . ARIPiprazole (ABILIFY) 5 MG tablet Take 1 tablet (5 mg total) by mouth daily. 30 tablet 2  . dicyclomine (BENTYL) 10 MG  capsule Take 1 capsule (10 mg total) by mouth 4 (four) times daily -  before meals and at bedtime. 120 capsule 3  . DULoxetine (CYMBALTA) 60 MG capsule Take 1 capsule (60 mg total) by mouth daily. 30 capsule 2  . furosemide (LASIX) 20 MG tablet TAKE 1 TABLET BY MOUTH DAILY AS NEEDED FOR FLUID. 30 tablet 0  . HYDROmorphone (DILAUDID) 2 MG tablet Take 2 mg by mouth every 6 (six) hours as needed.    . lamoTRIgine (LAMICTAL) 150 MG tablet Take 1 tablet (150 mg total) by mouth daily. TAKE 1 TABLET BY MOUTH EVERY DAY 30 tablet 2  . LINZESS 145 MCG CAPS capsule Take 145 mcg by mouth daily.    . Na Sulfate-K Sulfate-Mg Sulf (SUPREP BOWEL PREP KIT) 17.5-3.13-1.6 GM/177ML SOLN Take 1 kit by mouth as directed. For colonoscopy prep 354 mL 0  . ondansetron (ZOFRAN) 4 MG tablet Take 4 mg by mouth as needed.    . pantoprazole (PROTONIX) 40 MG tablet Take 1 tablet (40 mg total) by mouth daily. 90 tablet 3  . pravastatin (PRAVACHOL) 10 MG tablet Take 1 tablet (10 mg total) by mouth daily. 90 tablet 1  . rizatriptan (MAXALT-MLT) 10 MG disintegrating tablet Take 1 tablet at earliest onset of migraine.  May repeat in 2 hours if needed.  Maximum 2 tablets in 24 hours 9 tablet 5  . rOPINIRole (REQUIP) 1 MG tablet TAKE 2 TABLETS (2 MG TOTAL) BY MOUTH AT BEDTIME. 180 tablet 1  . simvastatin (ZOCOR) 20 MG tablet Take 20 mg by mouth at bedtime.    . topiramate (TOPAMAX) 200 MG tablet Take 1 tablet (200 mg total) by mouth at bedtime. 90 tablet 1  . traZODone (DESYREL) 50 MG tablet Take 1 tablet (50 mg total) by mouth at bedtime as needed for  sleep. 30 tablet 2   No current facility-administered medications for this visit.    Allergies Allergies  Allergen Reactions  . Morphine Hives and Nausea And Vomiting    INTOLERANCE >  N & V  . Penicillins Hives, Itching and Other (See Comments)     PATIENT HAS HAD A PCN REACTION WITH IMMEDIATE RASH, FACIAL/TONGUE/THROAT SWELLING, SOB, OR LIGHTHEADEDNESS WITH HYPOTENSION:  #  #  #   YES  #  #  #   Has patient had a PCN reaction causing severe rash involving mucus membranes or skin necrosis: Unknown Has patient had a PCN reaction that required hospitalization: No Has patient had a PCN reaction occurring within the last 10 years: No If all of the above answers are "NO", then may proceed with Cephalosporin use.     Histories Past Medical History:  Diagnosis Date  . Anterolisthesis   . Anxiety    per pt, has panic attacks  . Arthritis    in back per pt.  . Arthritis of right hip   . Barrett's esophagus    egd 2007 + 2009 but none 2011 or 08/2010 egd  . Chronic headache   . Colon polyp    hyperplastic  . Depression    hx of suicide attempt in 2003  . Grief reaction   . Hidradenitis suppurativa   . IBS (irritable bowel syndrome)   . Migraine   . OSA (obstructive sleep apnea)   . Ovarian cyst   . Restless leg   . Tobacco abuse    Past Surgical History:  Procedure Laterality Date  . ABDOMINAL HYSTERECTOMY     secondary to endometriosis  . CESAREAN SECTION     x 2  . CHOLECYSTECTOMY    . COLONOSCOPY W/ POLYPECTOMY  2012  . ELBOW SURGERY     left  . LAPAROSCOPY  04-22-10   lysis of adhesions  . LAPAROSCOPY  04-30-10    with mini laparotomy and cystotomy repair  . plantar fascitis     right foot  . SALPINGECTOMY  04-22-10   right  . TOTAL HIP ARTHROPLASTY Right 04/25/2017   Procedure: RIGHT TOTAL HIP ARTHROPLASTY ANTERIOR APPROACH;  Surgeon: Rod Can, MD;  Location: [facility];  Service: Orthopedics;  Laterality: Right;  Needs RNFA  . TUBAL LIGATION    . WRIST SURGERY     right/ganglion cyst   Social History   Socioeconomic History  . Marital status: Divorced    Spouse name: Not on file  . Number of children: 2  . Years of education: Not on file  . Highest education level: Not on file  Occupational History  . Occupation: Bartender  Tobacco Use  . Smoking status: Former Smoker    Packs/day: 0.50    Years: 30.00    Pack years: 15.00     Types: Cigarettes    Quit date: 06/21/2015    Years since quitting: 4.7  . Smokeless tobacco: Never Used  Vaping Use  . Vaping Use: Never used  Substance and Sexual Activity  . Alcohol use: Yes    Alcohol/week: 0.0 standard drinks    Comment: 2 x a month  . Drug use: No  . Sexual activity: Yes  Other Topics Concern  . Not on file  Social History Narrative   Occupation: bartender   Divorced  -- currently single   Alcohol use-yes -x 5 per weekend   No longer a smoker   Social Determinants of Radio broadcast assistant  Strain: Not on file  Food Insecurity: Not on file  Transportation Needs: Not on file  Physical Activity: Not on file  Stress: Not on file  Social Connections: Not on file  Intimate Partner Violence: Not on file   Family History  Problem Relation Age of Onset  . COPD Mother   . Stroke Mother   . Coronary artery disease Mother        pacer  . Colon polyps Mother   . Breast cancer Mother   . Migraines Mother   . Other Mother        RSDS  . Rheum arthritis Mother   . Hypertension Father   . Colon polyps Brother   . Colon cancer Maternal Grandfather   . Colon polyps Maternal Grandfather   . Breast cancer Maternal Aunt   . Uterine cancer Cousin        x 2  . Aneurysm Maternal Grandmother   . Kidney disease Brother   . Other Brother        back pain  . Kidney cancer Paternal Aunt   . Breast cancer Cousin   . Stomach cancer Neg Hx   . Pancreatic cancer Neg Hx   . Esophageal cancer Neg Hx   . Liver disease Neg Hx   . Inflammatory bowel disease Neg Hx    I have reviewed her medical, social, and family history in detail and updated the electronic medical record as necessary.    PHYSICAL EXAMINATION  BP 110/70   Pulse 83   Ht $R'5\' 6"'JU$  (1.676 m)   Wt 241 lb (109.3 kg)   LMP  (LMP Unknown)   SpO2 99%   BMI 38.90 kg/m  Wt Readings from Last 3 Encounters:  04/01/20 241 lb (109.3 kg)  02/01/20 242 lb (109.8 kg)  01/08/20 244 lb 6.4 oz (110.9 kg)   GEN: NAD, appears stated age, doesn't appear chronically ill PSYCH: Cooperative, without pressured speech EYE: Conjunctivae pink, sclerae anicteric ENT: Masked CV: Nontachycardic RESP: No audible wheezing GI: NABS, soft, rounded, obese, nontender, without rebound MSK/EXT: Lower extremity edema present SKIN: No jaundice NEURO:  Alert & Oriented x 3, no focal deficits   REVIEW OF DATA  I reviewed the following data at the time of this encounter:  GI Procedures and Studies  December 2021 colonoscopy - One 15 mm polyp at the appendiceal orifice. - Non-bleeding internal hemorrhoids.  Laboratory Studies  Reviewed those in epic  Imaging Studies  No relevant studies to review   ASSESSMENT  Ms. Stivers is a 55 y.o. female with a pmh significant for anxiety/MDD, bipolar disorder, arthritis hidradenitis suppurativa, migraines, OSA, cystocele, rectocele, status post hysterectomy, IBS-C/D, colon polyps, hemorrhoids.  The patient is seen today for evaluation and management of:  1. Cecal polyp   2. Abnormal colonoscopy   3. Family history of colonic polyps    The patient is hemodynamically and clinically stable.  Based upon the description and endoscopic pictures I do feel that it is reasonable to pursue an Advanced Polypectomy attempt of the polyp/lesion.  We discussed some of the techniques of advanced polypectomy which include Endoscopic Mucosal Resection, OVESCO Full-Thickness Resection, Endorotor Morcellation, and Tissue Ablation via Fulguration.  We also reviewed images of typical techniques as noted above.  The risks and benefits of endoscopic evaluation were discussed with the patient; these include but are not limited to the risk of perforation, infection, bleeding, missed lesions, lack of diagnosis, severe illness requiring hospitalization, as well as anesthesia and  sedation related illnesses.  During attempts at advanced resection, the risks of bleeding and perforation/leak are increased  as opposed to diagnostic and screening procedures, and that was discussed with the patient as well.   In addition, I explained that with the possible need for piecemeal resection, subsequent short-interval endoscopic evaluation for follow up and potential retreatment of the lesion/area may be necessary.  I did offer, a referral to surgery in order for patient to have opportunity to discuss surgical management/intervention prior to finalizing decision for attempt at endoscopic removal, however, the patient deferred on this.  If, after attempt at removal of the polyp/lesion, it is found that the patient has a complication or that an invasive lesion or malignant lesion is found, or that the polyp/lesion continues to recur, the patient is aware and understands that surgery may still be indicated/required.  All patient questions were answered, to the best of my ability, and the patient agrees to the aforementioned plan of action with follow-up as indicated.   PLAN  Preprocedure labs to be obtained as outlined below Proceed with scheduling colonoscopy with EMR Follow-up to be dictated based on findings at time of procedure   Orders Placed This Encounter  Procedures  . Procedural/ Surgical [place] Request: COLONOSCOPY WITH PROPOFOL, ENDOSCOPIC MUCOSAL RESECTION  . INR/PT  . CBC  . Basic metabolic panel  . Ambulatory referral to Gastroenterology    New Prescriptions   NA SULFATE-K SULFATE-MG SULF (SUPREP BOWEL PREP KIT) 17.5-3.13-1.6 GM/177ML SOLN    Take 1 kit by mouth as directed. For colonoscopy prep   Modified Medications   No medications on file    Planned Follow Up No follow-ups on file.   Total Time in Face-to-Face and in Coordination of Care for patient including independent/personal interpretation/review of prior testing, medical history, examination, medication adjustment, communicating results with the patient directly, and documentation with the EHR is 30 minutes.   Justice Britain, MD [facility] Office # [phone]

## 2020-04-04 NOTE — Progress Notes (Signed)
[facility] VISIT   Primary Care Provider [place], [place], [place] [address] [place] [address] [phone]  Referring Provider Dr. Silverio Decamp  Patient Profile: [name] White is a 55 y.o. female with a pmh significant for anxiety/MDD, bipolar disorder, arthritis hidradenitis suppurativa, migraines, OSA, cystocele, rectocele, status post hysterectomy, IBS-C/D, colon polyps, hemorrhoids.  The patient presents to the [facility] for an evaluation and management of problem(s) noted below:  Problem List 1. Cecal polyp   2. Abnormal colonoscopy   3. Family history of colonic polyps     History of Present Illness The patient recently underwent a screening colonoscopy. She was found to have a polyp that was adjacent to the appendix orifice but did not look to be involving it at that time. She is referred for consideration of endoscopic resection. Patient describes change in her GI issues for which she is seeing Dr. Silverio Decamp. She denies significant bleeding (melena/hematochezia/maroon stools). The patient's family history is significant for multiple members with colon polyps as well as a maternal grandfather who had colon cancer. The patient does not take significant nonsteroidals or BC/Goody powders.  GI Review of Systems Positive as above Negative for dysphagia, odynophagia, nausea, vomiting, change in bowel habits  Review of Systems General: Denies fevers/chills/weight loss unintentionally Cardiovascular: Denies chest pain Pulmonary: Denies shortness of breath Gastroenterological: See HPI Genitourinary: Denies darkened urine Hematological: Denies easy bruising/bleeding Dermatological: Denies jaundice Psychological: Mood is stable   Medications Current Outpatient Medications  Medication Sig Dispense Refill  . ARIPiprazole (ABILIFY) 5 MG tablet Take 1 tablet (5 mg total) by mouth daily. 30 tablet 2  . dicyclomine (BENTYL) 10 MG  capsule Take 1 capsule (10 mg total) by mouth 4 (four) times daily -  before meals and at bedtime. 120 capsule 3  . DULoxetine (CYMBALTA) 60 MG capsule Take 1 capsule (60 mg total) by mouth daily. 30 capsule 2  . furosemide (LASIX) 20 MG tablet TAKE 1 TABLET BY MOUTH DAILY AS NEEDED FOR FLUID. 30 tablet 0  . HYDROmorphone (DILAUDID) 2 MG tablet Take 2 mg by mouth every 6 (six) hours as needed.    . lamoTRIgine (LAMICTAL) 150 MG tablet Take 1 tablet (150 mg total) by mouth daily. TAKE 1 TABLET BY MOUTH EVERY DAY 30 tablet 2  . LINZESS 145 MCG CAPS capsule Take 145 mcg by mouth daily.    . Na Sulfate-K Sulfate-Mg Sulf (SUPREP BOWEL PREP KIT) 17.5-3.13-1.6 GM/177ML SOLN Take 1 kit by mouth as directed. For colonoscopy prep 354 mL 0  . ondansetron (ZOFRAN) 4 MG tablet Take 4 mg by mouth as needed.    . pantoprazole (PROTONIX) 40 MG tablet Take 1 tablet (40 mg total) by mouth daily. 90 tablet 3  . pravastatin (PRAVACHOL) 10 MG tablet Take 1 tablet (10 mg total) by mouth daily. 90 tablet 1  . rizatriptan (MAXALT-MLT) 10 MG disintegrating tablet Take 1 tablet at earliest onset of migraine.  May repeat in 2 hours if needed.  Maximum 2 tablets in 24 hours 9 tablet 5  . rOPINIRole (REQUIP) 1 MG tablet TAKE 2 TABLETS (2 MG TOTAL) BY MOUTH AT BEDTIME. 180 tablet 1  . simvastatin (ZOCOR) 20 MG tablet Take 20 mg by mouth at bedtime.    . topiramate (TOPAMAX) 200 MG tablet Take 1 tablet (200 mg total) by mouth at bedtime. 90 tablet 1  . traZODone (DESYREL) 50 MG tablet Take 1 tablet (50 mg total) by mouth at bedtime as needed for  sleep. 30 tablet 2   No current facility-administered medications for this visit.    Allergies Allergies  Allergen Reactions  . Morphine Hives and Nausea And Vomiting    INTOLERANCE >  N & V  . Penicillins Hives, Itching and Other (See Comments)     PATIENT HAS HAD A PCN REACTION WITH IMMEDIATE RASH, FACIAL/TONGUE/THROAT SWELLING, SOB, OR LIGHTHEADEDNESS WITH HYPOTENSION:  #  #  #   YES  #  #  #   Has patient had a PCN reaction causing severe rash involving mucus membranes or skin necrosis: Unknown Has patient had a PCN reaction that required hospitalization: No Has patient had a PCN reaction occurring within the last 10 years: No If all of the above answers are "NO", then may proceed with Cephalosporin use.     Histories Past Medical History:  Diagnosis Date  . Anterolisthesis   . Anxiety    per pt, has panic attacks  . Arthritis    in back per pt.  . Arthritis of right hip   . Barrett's esophagus    egd 2007 + 2009 but none 2011 or 08/2010 egd  . Chronic headache   . Colon polyp    hyperplastic  . Depression    hx of suicide attempt in 2003  . Grief reaction   . Hidradenitis suppurativa   . IBS (irritable bowel syndrome)   . Migraine   . OSA (obstructive sleep apnea)   . Ovarian cyst   . Restless leg   . Tobacco abuse    Past Surgical History:  Procedure Laterality Date  . ABDOMINAL HYSTERECTOMY     secondary to endometriosis  . CESAREAN SECTION     x 2  . CHOLECYSTECTOMY    . COLONOSCOPY W/ POLYPECTOMY  2012  . ELBOW SURGERY     left  . LAPAROSCOPY  04-22-10   lysis of adhesions  . LAPAROSCOPY  04-30-10    with mini laparotomy and cystotomy repair  . plantar fascitis     right foot  . SALPINGECTOMY  04-22-10   right  . TOTAL HIP ARTHROPLASTY Right 04/25/2017   Procedure: RIGHT TOTAL HIP ARTHROPLASTY ANTERIOR APPROACH;  Surgeon: Rod Can, MD;  Location: [place];  Service: Orthopedics;  Laterality: Right;  Needs RNFA  . TUBAL LIGATION    . WRIST SURGERY     right/ganglion cyst   Social History   Socioeconomic History  . Marital status: Divorced    Spouse name: Not on file  . Number of children: 2  . Years of education: Not on file  . Highest education level: Not on file  Occupational History  . Occupation: Bartender  Tobacco Use  . Smoking status: Former Smoker    Packs/day: 0.50    Years: 30.00    Pack years: 15.00     Types: Cigarettes    Quit date: 06/21/2015    Years since quitting: 4.7  . Smokeless tobacco: Never Used  Vaping Use  . Vaping Use: Never used  Substance and Sexual Activity  . Alcohol use: Yes    Alcohol/week: 0.0 standard drinks    Comment: 2 x a month  . Drug use: No  . Sexual activity: Yes  Other Topics Concern  . Not on file  Social History Narrative   Occupation: bartender   Divorced  -- currently single   Alcohol use-yes -x 5 per weekend   No longer a smoker   Social Determinants of Radio broadcast assistant  Strain: Not on file  Food Insecurity: Not on file  Transportation Needs: Not on file  Physical Activity: Not on file  Stress: Not on file  Social Connections: Not on file  Intimate Partner Violence: Not on file   Family History  Problem Relation Age of Onset  . COPD Mother   . Stroke Mother   . Coronary artery disease Mother        pacer  . Colon polyps Mother   . Breast cancer Mother   . Migraines Mother   . Other Mother        RSDS  . Rheum arthritis Mother   . Hypertension Father   . Colon polyps Brother   . Colon cancer Maternal Grandfather   . Colon polyps Maternal Grandfather   . Breast cancer Maternal Aunt   . Uterine cancer Cousin        x 2  . Aneurysm Maternal Grandmother   . Kidney disease Brother   . Other Brother        back pain  . Kidney cancer Paternal Aunt   . Breast cancer Cousin   . Stomach cancer Neg Hx   . Pancreatic cancer Neg Hx   . Esophageal cancer Neg Hx   . Liver disease Neg Hx   . Inflammatory bowel disease Neg Hx    I have reviewed her medical, social, and family history in detail and updated the electronic medical record as necessary.    PHYSICAL EXAMINATION  BP 110/70   Pulse 83   Ht $R'5\' 6"'XD$  (1.676 m)   Wt 241 lb (109.3 kg)   LMP  (LMP Unknown)   SpO2 99%   BMI 38.90 kg/m  Wt Readings from Last 3 Encounters:  04/01/20 241 lb (109.3 kg)  02/01/20 242 lb (109.8 kg)  01/08/20 244 lb 6.4 oz (110.9 kg)   GEN: NAD, appears stated age, doesn't appear chronically ill PSYCH: Cooperative, without pressured speech EYE: Conjunctivae pink, sclerae anicteric ENT: Masked CV: Nontachycardic RESP: No audible wheezing GI: NABS, soft, rounded, obese, nontender, without rebound MSK/EXT: Lower extremity edema present SKIN: No jaundice NEURO:  Alert & Oriented x 3, no focal deficits   REVIEW OF DATA  I reviewed the following data at the time of this encounter:  GI Procedures and Studies  December 2021 colonoscopy - One 15 mm polyp at the appendiceal orifice. - Non-bleeding internal hemorrhoids.  Laboratory Studies  Reviewed those in epic  Imaging Studies  No relevant studies to review   ASSESSMENT  Ms. Krog is a 55 y.o. female with a pmh significant for anxiety/MDD, bipolar disorder, arthritis hidradenitis suppurativa, migraines, OSA, cystocele, rectocele, status post hysterectomy, IBS-C/D, colon polyps, hemorrhoids.  The patient is seen today for evaluation and management of:  1. Cecal polyp   2. Abnormal colonoscopy   3. Family history of colonic polyps    The patient is hemodynamically and clinically stable.  Based upon the description and endoscopic pictures I do feel that it is reasonable to pursue an Advanced Polypectomy attempt of the polyp/lesion.  We discussed some of the techniques of advanced polypectomy which include Endoscopic Mucosal Resection, OVESCO Full-Thickness Resection, Endorotor Morcellation, and Tissue Ablation via Fulguration.  We also reviewed images of typical techniques as noted above.  The risks and benefits of endoscopic evaluation were discussed with the patient; these include but are not limited to the risk of perforation, infection, bleeding, missed lesions, lack of diagnosis, severe illness requiring hospitalization, as well as anesthesia and  sedation related illnesses.  During attempts at advanced resection, the risks of bleeding and perforation/leak are increased  as opposed to diagnostic and screening procedures, and that was discussed with the patient as well.   In addition, I explained that with the possible need for piecemeal resection, subsequent short-interval endoscopic evaluation for follow up and potential retreatment of the lesion/area may be necessary.  I did offer, a referral to surgery in order for patient to have opportunity to discuss surgical management/intervention prior to finalizing decision for attempt at endoscopic removal, however, the patient deferred on this.  If, after attempt at removal of the polyp/lesion, it is found that the patient has a complication or that an invasive lesion or malignant lesion is found, or that the polyp/lesion continues to recur, the patient is aware and understands that surgery may still be indicated/required.  All patient questions were answered, to the best of my ability, and the patient agrees to the aforementioned plan of action with follow-up as indicated.   PLAN  Preprocedure labs to be obtained as outlined below Proceed with scheduling colonoscopy with EMR Follow-up to be dictated based on findings at time of procedure   Orders Placed This Encounter  Procedures  . Procedural/ Surgical [place] Request: COLONOSCOPY WITH PROPOFOL, ENDOSCOPIC MUCOSAL RESECTION  . INR/PT  . CBC  . Basic metabolic panel  . Ambulatory referral to Gastroenterology    New Prescriptions   NA SULFATE-K SULFATE-MG SULF (SUPREP BOWEL PREP KIT) 17.5-3.13-1.6 GM/177ML SOLN    Take 1 kit by mouth as directed. For colonoscopy prep   Modified Medications   No medications on file    Planned Follow Up No follow-ups on file.   Total Time in Face-to-Face and in Coordination of Care for patient including independent/personal interpretation/review of prior testing, medical history, examination, medication adjustment, communicating results with the patient directly, and documentation with the EHR is 30 minutes.   Justice Britain, MD [facility] Office # [phone]

## 2020-04-10 ENCOUNTER — Encounter: Payer: 59 | Attending: Physical Medicine & Rehabilitation | Admitting: Physical Medicine & Rehabilitation

## 2020-04-11 ENCOUNTER — Other Ambulatory Visit: Payer: Self-pay | Admitting: Physician Assistant

## 2020-04-28 ENCOUNTER — Other Ambulatory Visit (HOSPITAL_COMMUNITY)
Admission: RE | Admit: 2020-04-28 | Discharge: 2020-04-28 | Disposition: A | Discharge status: Home / Self Care | Payer: 59 | Source: Ambulatory Visit | Attending: Gastroenterology | Admitting: Gastroenterology

## 2020-04-28 DIAGNOSIS — Z01812 Encounter for preprocedural laboratory examination: Secondary | ICD-10-CM | POA: Insufficient documentation

## 2020-04-28 DIAGNOSIS — Z20822 Contact with and (suspected) exposure to covid-19: Secondary | ICD-10-CM | POA: Diagnosis not present

## 2020-04-28 LAB — SARS CORONAVIRUS 2 (TAT 6-24 HRS): SARS Coronavirus 2: NEGATIVE

## 2020-04-29 ENCOUNTER — Encounter (HOSPITAL_COMMUNITY): Payer: Self-pay | Admitting: Psychiatry

## 2020-04-29 ENCOUNTER — Telehealth (INDEPENDENT_AMBULATORY_CARE_PROVIDER_SITE_OTHER): Payer: 59 | Admitting: Psychiatry

## 2020-04-29 ENCOUNTER — Other Ambulatory Visit: Payer: Self-pay

## 2020-04-29 DIAGNOSIS — F319 Bipolar disorder, unspecified: Secondary | ICD-10-CM | POA: Diagnosis not present

## 2020-04-29 DIAGNOSIS — F4310 Post-traumatic stress disorder, unspecified: Secondary | ICD-10-CM | POA: Diagnosis not present

## 2020-04-29 MED ORDER — DULOXETINE HCL 60 MG PO CPEP: 60.0000 mg | ORAL_CAPSULE | Freq: Every day | ORAL | 2 refills | Status: DC

## 2020-04-29 MED ORDER — ARIPIPRAZOLE 5 MG PO TABS: 5.0000 mg | ORAL_TABLET | Freq: Every day | ORAL | 2 refills | Status: DC

## 2020-04-29 MED ORDER — TRAZODONE HCL 100 MG PO TABS: 100.0000 mg | ORAL_TABLET | Freq: Every evening | ORAL | 2 refills | Status: DC | PRN

## 2020-04-29 MED ORDER — LAMOTRIGINE 150 MG PO TABS: 150.0000 mg | ORAL_TABLET | Freq: Every day | ORAL | 2 refills | Status: DC
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
## 2020-04-30 ENCOUNTER — Encounter (HOSPITAL_COMMUNITY): Payer: Self-pay | Admitting: Gastroenterology

## 2020-04-30 ENCOUNTER — Telehealth (HOSPITAL_COMMUNITY): Payer: 59 | Admitting: Psychiatry

## 2020-04-30 ENCOUNTER — Other Ambulatory Visit: Payer: Self-pay | Admitting: Gastroenterology

## 2020-04-30 NOTE — Progress Notes (Signed)
Patient denies shortness of breath, fever, cough or chest pain.  PCP - Franco Nones, PA Cardiologist - n/a Behavioral Health - Dr Karrie Meres - Dr Silverio Decamp Neuro - Metta Clines, DO  Chest x-ray - n/a EKG - n/a Stress Test - 07/27/05 ECHO - 11/28/19 Cardiac Cath - n/a  Sleep Study -  Yes CPAP - does not use cpap  STOP now taking any Aspirin (unless otherwise instructed by your surgeon), Aleve, Naproxen, Ibuprofen, Motrin, Advil, Goody's, BC's, all herbal medications, fish oil, and all vitamins.   Coronavirus Screening Covid test on 04/28/20 was negative.  Patient verbalized understanding of instructions that were given via phone.

## 2020-05-01 ENCOUNTER — Encounter (HOSPITAL_COMMUNITY): Payer: Self-pay | Admitting: Gastroenterology

## 2020-05-01 ENCOUNTER — Ambulatory Visit (HOSPITAL_COMMUNITY): Payer: 59 | Admitting: Certified Registered"

## 2020-05-01 ENCOUNTER — Encounter (HOSPITAL_COMMUNITY)
Admission: RE | Disposition: A | Discharge status: Home / Self Care | Payer: Self-pay | Source: Home / Self Care | Attending: Gastroenterology

## 2020-05-01 ENCOUNTER — Ambulatory Visit (HOSPITAL_COMMUNITY)
Admission: RE | Admit: 2020-05-01 | Discharge: 2020-05-01 | Disposition: A | Discharge status: Home / Self Care | Payer: 59 | Attending: Gastroenterology | Admitting: Gastroenterology

## 2020-05-01 DIAGNOSIS — R933 Abnormal findings on diagnostic imaging of other parts of digestive tract: Secondary | ICD-10-CM

## 2020-05-01 DIAGNOSIS — Z79899 Other long term (current) drug therapy: Secondary | ICD-10-CM | POA: Diagnosis not present

## 2020-05-01 DIAGNOSIS — K641 Second degree hemorrhoids: Secondary | ICD-10-CM | POA: Diagnosis not present

## 2020-05-01 DIAGNOSIS — K635 Polyp of colon: Secondary | ICD-10-CM

## 2020-05-01 DIAGNOSIS — D120 Benign neoplasm of cecum: Secondary | ICD-10-CM | POA: Diagnosis not present

## 2020-05-01 DIAGNOSIS — Z87891 Personal history of nicotine dependence: Secondary | ICD-10-CM | POA: Insufficient documentation

## 2020-05-01 DIAGNOSIS — Z885 Allergy status to narcotic agent status: Secondary | ICD-10-CM | POA: Insufficient documentation

## 2020-05-01 DIAGNOSIS — Z880 Allergy status to penicillin: Secondary | ICD-10-CM | POA: Insufficient documentation

## 2020-05-01 DIAGNOSIS — D127 Benign neoplasm of rectosigmoid junction: Secondary | ICD-10-CM

## 2020-05-01 HISTORY — PX: ENDOSCOPIC MUCOSAL RESECTION: SHX6839

## 2020-05-01 HISTORY — PX: SUBMUCOSAL LIFTING INJECTION: SHX6855

## 2020-05-01 HISTORY — PX: COLONOSCOPY WITH PROPOFOL: SHX5780

## 2020-05-01 HISTORY — DX: Bipolar disorder, unspecified: F31.9

## 2020-05-01 HISTORY — DX: Gastro-esophageal reflux disease without esophagitis: K21.9

## 2020-05-01 HISTORY — PX: HEMOSTASIS CLIP PLACEMENT: SHX6857

## 2020-05-01 HISTORY — PX: POLYPECTOMY: SHX5525

## 2020-05-01 SURGERY — COLONOSCOPY WITH PROPOFOL
Anesthesia: Monitor Anesthesia Care

## 2020-05-01 MED ORDER — PHENYLEPHRINE 40 MCG/ML (10ML) SYRINGE FOR IV PUSH (FOR BLOOD PRESSURE SUPPORT)
PREFILLED_SYRINGE | INTRAVENOUS | Status: DC | PRN
  Administered 2020-05-01 (×2): 40 ug via INTRAVENOUS

## 2020-05-01 MED ORDER — SODIUM CHLORIDE 0.9 % IV SOLN: INTRAVENOUS | Status: DC

## 2020-05-01 MED ORDER — LIDOCAINE HCL (CARDIAC) PF 100 MG/5ML IV SOSY
PREFILLED_SYRINGE | INTRAVENOUS | Status: DC | PRN
  Administered 2020-05-01: 60 mg via INTRAVENOUS

## 2020-05-01 MED ORDER — ONDANSETRON HCL 4 MG/2ML IJ SOLN
INTRAMUSCULAR | Status: DC | PRN
  Administered 2020-05-01: 4 mg via INTRAVENOUS

## 2020-05-01 MED ORDER — PROPOFOL 500 MG/50ML IV EMUL
INTRAVENOUS | Status: DC | PRN
  Administered 2020-05-01: 100 ug/kg/min via INTRAVENOUS

## 2020-05-01 MED ORDER — PROPOFOL 10 MG/ML IV BOLUS
INTRAVENOUS | Status: DC | PRN
  Administered 2020-05-01: 20 mg via INTRAVENOUS
  Administered 2020-05-01: 25 mg via INTRAVENOUS

## 2020-05-01 MED ORDER — LACTATED RINGERS IV SOLN: INTRAVENOUS | Status: DC | PRN

## 2020-05-01 SURGICAL SUPPLY — 22 items

## 2020-05-01 NOTE — Anesthesia Preprocedure Evaluation (Addendum)
Anesthesia Evaluation  Patient identified by MRN, date of birth, ID band Patient awake    Reviewed: Allergy & Precautions, NPO status , Patient's Chart, lab work & pertinent test results  Airway Mallampati: II  TM Distance: >3 FB Neck ROM: Full    Dental  (+) Partial Lower, Upper Dentures,    Pulmonary sleep apnea , former smoker,    Pulmonary exam normal breath sounds clear to auscultation       Cardiovascular negative cardio ROS Normal cardiovascular exam Rhythm:Regular Rate:Normal     Neuro/Psych  Headaches, PSYCHIATRIC DISORDERS Anxiety Depression Bipolar Disorder    GI/Hepatic Neg liver ROS, GERD  ,  Endo/Other  Morbid obesity  Renal/GU negative Renal ROS  negative genitourinary   Musculoskeletal  (+) Arthritis ,   Abdominal   Peds negative pediatric ROS (+)  Hematology negative hematology ROS (+)   Anesthesia Other Findings   Reproductive/Obstetrics negative OB ROS                            Anesthesia Physical Anesthesia Plan  ASA: III  Anesthesia Plan: MAC   Post-op Pain Management:    Induction: Intravenous  PONV Risk Score and Plan: 3 and Propofol infusion, TIVA and Treatment may vary due to age or medical condition  Airway Management Planned: Natural Airway and Simple Face Mask  Additional Equipment:   Intra-op Plan:   Post-operative Plan: Extubation in OR  Informed Consent: I have reviewed the patients History and Physical, chart, labs and discussed the procedure including the risks, benefits and alternatives for the proposed anesthesia with the patient or authorized representative who has indicated his/her understanding and acceptance.       Plan Discussed with: CRNA and Anesthesiologist  Anesthesia Plan Comments:         Anesthesia Quick Evaluation

## 2020-05-01 NOTE — Anesthesia Procedure Notes (Signed)
Procedure Name: MAC Date/Time: 05/01/2020 11:57 AM Performed by: Mariea Clonts, CRNA Pre-anesthesia Checklist: Patient identified, Emergency Drugs available, Suction available, Patient being monitored and Timeout performed Patient Re-evaluated:Patient Re-evaluated prior to induction Oxygen Delivery Method: Nasal cannula

## 2020-05-01 NOTE — Anesthesia Postprocedure Evaluation (Signed)
Anesthesia Post Note  Patient: [name] White  Procedure(s) Performed: COLONOSCOPY WITH PROPOFOL (N/A ) ENDOSCOPIC MUCOSAL RESECTION (N/A ) SUBMUCOSAL LIFTING INJECTION HEMOSTASIS CLIP PLACEMENT POLYPECTOMY     Patient location during evaluation: PACU Anesthesia Type: MAC Level of consciousness: awake and alert Pain management: pain level controlled Vital Signs Assessment: post-procedure vital signs reviewed and stable Respiratory status: spontaneous breathing and respiratory function stable Cardiovascular status: stable Postop Assessment: no apparent nausea or vomiting Anesthetic complications: no   No complications documented.  Last Vitals:  Vitals:   05/01/20 1321 05/01/20 1329  BP: 112/67 101/61  Pulse: 72 72  Resp: 20 19  Temp:  36.5 C  SpO2: 95% 97%    Last Pain:  Vitals:   05/01/20 1329  TempSrc:   PainSc: 0-No pain                 Merlinda Frederick

## 2020-05-01 NOTE — Op Note (Signed)
[facility] Patient Name: [name] White Procedure Date : 05/01/2020 MRN: [id number] Attending MD: Justice Britain , MD Date of Birth: 10-31-1965 CSN: [id number] Age: 55 Admit Type: Inpatient Procedure:                Colonoscopy Indications:              Excision of colonic polyp Providers:                Justice Britain, MD, Jeanella Cara, RN,                            Tyrone Apple, Technician, [facility],                            Technician, Virgilio Belling. Huel Cote, CRNA Referring MD:             Mauri Pole, MD, Inda Coke Medicines:                Monitored Anesthesia Care Complications:            No immediate complications. Estimated Blood Loss:     Estimated blood loss was minimal. Procedure:                Pre-Anesthesia Assessment:                           - Prior to the procedure, a History and Physical                            was performed, and patient medications and                            allergies were reviewed. The patient's tolerance of                            previous anesthesia was also reviewed. The risks                            and benefits of the procedure and the sedation                            options and risks were discussed with the patient.                            All questions were answered, and informed consent                            was obtained. Prior Anticoagulants: The patient has                            taken no previous anticoagulant or antiplatelet                            agents. ASA Grade Assessment: III - A patient with  severe systemic disease. After reviewing the risks                            and benefits, the patient was deemed in                            satisfactory condition to undergo the procedure.                           After obtaining informed consent, the colonoscope                            was passed under direct vision.  Throughout the                            procedure, the patient's blood pressure, pulse, and                            oxygen saturations were monitored continuously. The                            CF-HQ190L ([id number]) Olympus colonoscope was                            introduced through the anus and advanced to the 5                            cm into the ileum. The colonoscopy was performed                            without difficulty. The patient tolerated the                            procedure. The quality of the bowel preparation was                            good. The terminal ileum, ileocecal valve,                            appendiceal orifice, and rectum were photographed. Scope In: 12:13:19 PM Scope Out: 1:00:37 PM Scope Withdrawal Time: 0 hours 44 minutes 34 seconds  Total Procedure Duration: 0 hours 47 minutes 18 seconds  Findings:      The digital rectal exam findings include hemorrhoids. Pertinent       negatives include no palpable rectal lesions.      The terminal ileum and ileocecal valve appeared normal.      A 20 mm polyp was found in the cecum/peri-appendiceal orifice region.       The polyp was granular lateral spreading. Preparations were made for       mucosal resection. NBI imaging and White-light endoscopy was done to       demarcate the borders of the lesion. Orise gel was injected to raise the       lesion. Piecemeal mucosal resection using a snare was performed.  Resection and retrieval were complete. To prevent bleeding after mucosal       resection, four hemostatic clips were successfully placed (MR       conditional). There was no bleeding at the end of the procedure.      A 3 mm polyp was found in the recto-sigmoid colon. The polyp was       sessile. The polyp was removed with a cold snare. Resection and       retrieval were complete.      Normal mucosa was found in the entire colon otherwise.      Non-bleeding non-thrombosed external and  internal hemorrhoids were found       during retroflexion, during perianal exam and during digital exam. The       hemorrhoids were Grade II (internal hemorrhoids that prolapse but reduce       spontaneously). Impression:               - Hemorrhoids found on digital rectal exam.                           - The examined portion of the ileum was normal.                           - One 20 mm polyp at the cecum/peri-appendiceal                            orifice region was found, removed with picemeal                            mucosal resection. Resected and retrieved. Clips                            (MR conditional) were placed.                           - One 3 mm polyp at the recto-sigmoid colon,                            removed with a cold snare. Resected and retrieved.                           - Normal mucosa in the entire examined colon.                           - Non-bleeding non-thrombosed external and internal                            hemorrhoids. Recommendation:           - The patient will be observed post-procedure,                            until all discharge criteria are met.                           - Discharge patient to home.                           -  Patient has a contact number available for                            emergencies. The signs and symptoms of potential                            delayed complications were discussed with the                            patient. Return to normal activities tomorrow.                            Written discharge instructions were provided to the                            patient.                           - High fiber diet.                           - Use FiberCon 1-2 tablets PO daily.                           - Continue present medications.                           - Await pathology results.                           - Repeat colonoscopy in 6-9 months for surveillance.                           - The findings and  recommendations were discussed                            with the patient.                           - The findings and recommendations were discussed                            with the designated responsible adult. Procedure Code(s):        --- Professional ---                           [phone], Colonoscopy, flexible; with endoscopic                            mucosal resection Diagnosis Code(s):        --- Professional ---                           K64.1, Second degree hemorrhoids                           K63.5, Polyp of colon CPT  copyright 2019 American Medical Association. All rights reserved. The codes documented in this report are preliminary and upon coder review may  be revised to meet current compliance requirements. Justice Britain, MD 05/01/2020 1:14:08 PM Number of Addenda: 0

## 2020-05-01 NOTE — Transfer of Care (Signed)
Immediate Anesthesia Transfer of Care Note  Patient: Aubrii Sharpless Joffe  Procedure(s) Performed: COLONOSCOPY WITH PROPOFOL (N/A ) ENDOSCOPIC MUCOSAL RESECTION (N/A ) SUBMUCOSAL LIFTING INJECTION HEMOSTASIS CLIP PLACEMENT POLYPECTOMY  Patient Location: PACU  Anesthesia Type:MAC  Level of Consciousness: drowsy and patient cooperative  Airway & Oxygen Therapy: Patient Spontanous Breathing  Post-op Assessment: Report given to RN and Post -op Vital signs reviewed and stable  Post vital signs: Reviewed and stable  Last Vitals:  Vitals Value Taken Time  BP    Temp    Pulse    Resp    SpO2      Last Pain:  Vitals:   05/01/20 1000  TempSrc: Temporal         Complications: No complications documented.

## 2020-05-01 NOTE — Interval H&P Note (Signed)
History and Physical Interval Note:  05/01/2020 11:41 AM  [name] White  has presented today for surgery, with the diagnosis of Cecal Polyp, Abnormal Colonoscopy.  The various methods of treatment have been discussed with the patient and family. After consideration of risks, benefits and other options for treatment, the patient has consented to  Procedure(s): COLONOSCOPY WITH PROPOFOL (N/A) ENDOSCOPIC MUCOSAL RESECTION (N/A) as a surgical intervention.  The patient's history has been reviewed, patient examined, no change in status, stable for surgery.  I have reviewed the patient's chart and labs.  Questions were answered to the patient's satisfaction.     [employer]

## 2020-05-02 ENCOUNTER — Telehealth: Payer: Self-pay | Admitting: Gastroenterology

## 2020-05-02 LAB — SURGICAL PATHOLOGY

## 2020-05-02 NOTE — Telephone Encounter (Signed)
Continue to monitor on a liquid diet today and if pain worsens or she develops bleeding, fevers, chills, nausea, vomiting call back or go to ED. Colonoscopy performed by Dr. Rush Landmark and she is Dr. Woodward White patient so please contact her with any additional questions.

## 2020-05-02 NOTE — Telephone Encounter (Signed)
I'm sorry to hear that she is having the discomfort. It is interesting because this was basically a completely underwater cold resection. Risk of post polypectomy syndrome very unlikely. No further recommendations o there than what you have let her know. If pain significantly increases then emergency department/urgent care evaluation is reasonable. Thanks. GM

## 2020-05-02 NOTE — Telephone Encounter (Signed)
Patient called and said she had a procedure done yesterday at the hospital. Is experiencing a burning sensation where the poylp was removed and wants to know if that is normal.

## 2020-05-02 NOTE — Telephone Encounter (Signed)
Noted the pt has been advised. She will go to the ED if she develops fever, chills, or bleeding.

## 2020-05-02 NOTE — Telephone Encounter (Signed)
The pt had Colon yesterday (see below) She states that since last night she has some slight discomfort and burning in the area where polyp was removed. She denies nausea, bleeding, fever or chills.  She will have liquids and soft foods today and take 500 mg tylenol.  She is using a heating pad in the area and states that does seem to help.  She was told to go to the ED if she develops bleeding, fever or chills.  She will call on Monday to update.  Dr Fuller Plan you are DOD can you please review and make any further recommendations if any.  Thank you

## 2020-05-04 ENCOUNTER — Encounter (HOSPITAL_COMMUNITY): Payer: Self-pay | Admitting: Gastroenterology

## 2020-05-06 ENCOUNTER — Encounter: Payer: Self-pay | Admitting: Gastroenterology

## 2020-05-10 ENCOUNTER — Other Ambulatory Visit (HOSPITAL_COMMUNITY): Payer: Self-pay | Admitting: Psychiatry

## 2020-05-10 DIAGNOSIS — F4310 Post-traumatic stress disorder, unspecified: Secondary | ICD-10-CM

## 2020-05-12 ENCOUNTER — Other Ambulatory Visit: Payer: Self-pay | Admitting: Physician Assistant

## 2020-05-13 ENCOUNTER — Other Ambulatory Visit: Payer: Self-pay | Admitting: Neurology

## 2020-05-15 ENCOUNTER — Other Ambulatory Visit (INDEPENDENT_AMBULATORY_CARE_PROVIDER_SITE_OTHER): Payer: 59

## 2020-05-15 ENCOUNTER — Ambulatory Visit: Payer: 59

## 2020-05-15 ENCOUNTER — Telehealth: Payer: Self-pay | Admitting: Neurology

## 2020-05-15 ENCOUNTER — Other Ambulatory Visit: Payer: Self-pay

## 2020-05-15 DIAGNOSIS — G43009 Migraine without aura, not intractable, without status migrainosus: Secondary | ICD-10-CM | POA: Diagnosis not present

## 2020-05-15 MED ORDER — DIPHENHYDRAMINE HCL 50 MG/ML IJ SOLN
25.0000 mg | Freq: Once | INTRAMUSCULAR | Status: AC
  Administered 2020-05-15: 25 mg via INTRAMUSCULAR

## 2020-05-15 MED ORDER — KETOROLAC TROMETHAMINE 60 MG/2ML IM SOLN
60.0000 mg | Freq: Once | INTRAMUSCULAR | Status: AC
Start: 2020-05-15 — End: 2020-05-15
  Administered 2020-05-15: 60 mg via INTRAMUSCULAR

## 2020-05-15 MED ORDER — METOCLOPRAMIDE HCL 5 MG/ML IJ SOLN
10.0000 mg | Freq: Once | INTRAMUSCULAR | Status: AC
  Administered 2020-05-15: 10 mg via INTRAMUSCULAR

## 2020-05-15 NOTE — Telephone Encounter (Signed)
No answer at 319 05/12/2020.

## 2020-05-15 NOTE — Telephone Encounter (Signed)
Please advise 

## 2020-05-15 NOTE — Telephone Encounter (Signed)
Please place on schedule

## 2020-05-15 NOTE — Telephone Encounter (Signed)
I have no objection.  She will need a driver

## 2020-05-15 NOTE — Telephone Encounter (Signed)
Patient is coming over to get the shot.

## 2020-05-15 NOTE — Telephone Encounter (Signed)
I got her on the sch

## 2020-05-15 NOTE — Telephone Encounter (Signed)
Patient called in stating she is fighting a migraine since yesterday and wants to see if she can come in for a headache cocktail?

## 2020-05-21 ENCOUNTER — Other Ambulatory Visit: Payer: Self-pay | Admitting: Physician Assistant

## 2020-05-21 DIAGNOSIS — E7800 Pure hypercholesterolemia, unspecified: Secondary | ICD-10-CM

## 2020-05-23 ENCOUNTER — Other Ambulatory Visit (HOSPITAL_COMMUNITY): Payer: Self-pay | Admitting: Psychiatry

## 2020-05-23 DIAGNOSIS — F4310 Post-traumatic stress disorder, unspecified: Secondary | ICD-10-CM

## 2020-06-09 ENCOUNTER — Other Ambulatory Visit: Payer: Self-pay | Admitting: Physician Assistant

## 2020-07-23 ENCOUNTER — Other Ambulatory Visit (HOSPITAL_COMMUNITY): Payer: Self-pay | Admitting: Psychiatry

## 2020-07-23 DIAGNOSIS — F4310 Post-traumatic stress disorder, unspecified: Secondary | ICD-10-CM

## 2020-07-29 ENCOUNTER — Encounter (HOSPITAL_COMMUNITY): Payer: Self-pay | Admitting: Psychiatry

## 2020-07-29 ENCOUNTER — Telehealth (HOSPITAL_COMMUNITY): Payer: 59 | Admitting: Psychiatry

## 2020-07-29 ENCOUNTER — Telehealth (INDEPENDENT_AMBULATORY_CARE_PROVIDER_SITE_OTHER): Payer: 59 | Admitting: Psychiatry

## 2020-07-29 ENCOUNTER — Other Ambulatory Visit: Payer: Self-pay

## 2020-07-29 DIAGNOSIS — F319 Bipolar disorder, unspecified: Secondary | ICD-10-CM

## 2020-07-29 DIAGNOSIS — F4310 Post-traumatic stress disorder, unspecified: Secondary | ICD-10-CM

## 2020-07-29 MED ORDER — ARIPIPRAZOLE 5 MG PO TABS: 5.0000 mg | ORAL_TABLET | Freq: Every day | ORAL | 2 refills | Status: DC

## 2020-07-29 MED ORDER — LAMOTRIGINE 200 MG PO TABS: 200.0000 mg | ORAL_TABLET | Freq: Every day | ORAL | 2 refills | Status: DC

## 2020-07-29 MED ORDER — TRAZODONE HCL 100 MG PO TABS: 100.0000 mg | ORAL_TABLET | Freq: Every evening | ORAL | 2 refills | Status: DC | PRN

## 2020-07-29 MED ORDER — DULOXETINE HCL 60 MG PO CPEP: 60.0000 mg | ORAL_CAPSULE | Freq: Every day | ORAL | 1 refills | Status: DC
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
## 2020-08-12 ENCOUNTER — Other Ambulatory Visit: Payer: Self-pay | Admitting: Physician Assistant

## 2020-08-12 ENCOUNTER — Other Ambulatory Visit (HOSPITAL_COMMUNITY): Payer: Self-pay | Admitting: Psychiatry

## 2020-08-12 DIAGNOSIS — F319 Bipolar disorder, unspecified: Secondary | ICD-10-CM

## 2020-09-08 ENCOUNTER — Other Ambulatory Visit: Payer: Self-pay | Admitting: Neurology

## 2020-09-13 ENCOUNTER — Encounter: Payer: Self-pay | Admitting: Gastroenterology

## 2020-10-07 ENCOUNTER — Other Ambulatory Visit: Payer: Self-pay

## 2020-10-07 ENCOUNTER — Telehealth (INDEPENDENT_AMBULATORY_CARE_PROVIDER_SITE_OTHER): Payer: Medicare Other | Admitting: Psychiatry

## 2020-10-07 ENCOUNTER — Encounter (HOSPITAL_COMMUNITY): Payer: Self-pay | Admitting: Psychiatry

## 2020-10-07 DIAGNOSIS — F4310 Post-traumatic stress disorder, unspecified: Secondary | ICD-10-CM | POA: Diagnosis not present

## 2020-10-07 DIAGNOSIS — F319 Bipolar disorder, unspecified: Secondary | ICD-10-CM

## 2020-10-07 MED ORDER — LAMOTRIGINE 200 MG PO TABS: 200.0000 mg | ORAL_TABLET | Freq: Every day | ORAL | 2 refills | Status: DC

## 2020-10-07 MED ORDER — ARIPIPRAZOLE 5 MG PO TABS: 5.0000 mg | ORAL_TABLET | Freq: Every day | ORAL | 2 refills | Status: DC

## 2020-10-07 MED ORDER — DULOXETINE HCL 60 MG PO CPEP: 60.0000 mg | ORAL_CAPSULE | Freq: Every day | ORAL | 2 refills | Status: DC

## 2020-10-07 MED ORDER — TRAZODONE HCL 100 MG PO TABS: 100.0000 mg | ORAL_TABLET | Freq: Every evening | ORAL | 2 refills | Status: DC | PRN
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
## 2020-10-25 ENCOUNTER — Other Ambulatory Visit: Payer: Self-pay | Admitting: Gastroenterology

## 2020-11-21 ENCOUNTER — Other Ambulatory Visit: Payer: Self-pay | Admitting: Physician Assistant

## 2020-11-21 DIAGNOSIS — E7800 Pure hypercholesterolemia, unspecified: Secondary | ICD-10-CM

## 2020-11-26 LAB — HM HEPATITIS C SCREENING LAB: HM Hepatitis Screen: NEGATIVE

## 2020-12-02 ENCOUNTER — Other Ambulatory Visit: Payer: Self-pay | Admitting: Physician Assistant

## 2020-12-02 ENCOUNTER — Ambulatory Visit (INDEPENDENT_AMBULATORY_CARE_PROVIDER_SITE_OTHER): Payer: Medicare Other | Admitting: Physician Assistant

## 2020-12-02 ENCOUNTER — Encounter: Payer: Self-pay | Admitting: Physician Assistant

## 2020-12-02 ENCOUNTER — Other Ambulatory Visit: Payer: Self-pay

## 2020-12-02 VITALS — BP 98/52 | HR 75 | Temp 97.9°F | Wt 240.2 lb

## 2020-12-02 DIAGNOSIS — K219 Gastro-esophageal reflux disease without esophagitis: Secondary | ICD-10-CM

## 2020-12-02 DIAGNOSIS — Z23 Encounter for immunization: Secondary | ICD-10-CM

## 2020-12-02 DIAGNOSIS — I959 Hypotension, unspecified: Secondary | ICD-10-CM | POA: Diagnosis not present

## 2020-12-02 DIAGNOSIS — E669 Obesity, unspecified: Secondary | ICD-10-CM

## 2020-12-02 DIAGNOSIS — F17210 Nicotine dependence, cigarettes, uncomplicated: Secondary | ICD-10-CM

## 2020-12-02 DIAGNOSIS — R5383 Other fatigue: Secondary | ICD-10-CM | POA: Diagnosis not present

## 2020-12-02 DIAGNOSIS — E785 Hyperlipidemia, unspecified: Secondary | ICD-10-CM | POA: Diagnosis not present

## 2020-12-02 DIAGNOSIS — M7989 Other specified soft tissue disorders: Secondary | ICD-10-CM

## 2020-12-02 DIAGNOSIS — Z0001 Encounter for general adult medical examination with abnormal findings: Secondary | ICD-10-CM | POA: Diagnosis not present

## 2020-12-02 DIAGNOSIS — E559 Vitamin D deficiency, unspecified: Secondary | ICD-10-CM

## 2020-12-02 LAB — CBC WITH DIFFERENTIAL/PLATELET
Basophils Absolute: 0 10*3/uL (ref 0.0–0.1)
Basophils Relative: 0.7 % (ref 0.0–3.0)
Eosinophils Absolute: 0.2 10*3/uL (ref 0.0–0.7)
Eosinophils Relative: 3.3 % (ref 0.0–5.0)
HCT: 41.6 % (ref 36.0–46.0)
Hemoglobin: 13.7 g/dL (ref 12.0–15.0)
Lymphocytes Relative: 39.4 % (ref 12.0–46.0)
Lymphs Abs: 2.5 10*3/uL (ref 0.7–4.0)
MCHC: 32.9 g/dL (ref 30.0–36.0)
MCV: 98.6 fl (ref 78.0–100.0)
Monocytes Absolute: 0.8 10*3/uL (ref 0.1–1.0)
Monocytes Relative: 12.2 % — ABNORMAL HIGH (ref 3.0–12.0)
Neutro Abs: 2.8 10*3/uL (ref 1.4–7.7)
Neutrophils Relative %: 44.4 % (ref 43.0–77.0)
Platelets: 187 10*3/uL (ref 150.0–400.0)
RBC: 4.22 Mil/uL (ref 3.87–5.11)
RDW: 13.8 % (ref 11.5–15.5)
WBC: 6.3 10*3/uL (ref 4.0–10.5)

## 2020-12-02 LAB — VITAMIN B12: Vitamin B-12: 308 pg/mL (ref 211–911)

## 2020-12-02 LAB — COMPREHENSIVE METABOLIC PANEL
ALT: 18 U/L (ref 0–35)
AST: 21 U/L (ref 0–37)
Albumin: 4 g/dL (ref 3.5–5.2)
Alkaline Phosphatase: 102 U/L (ref 39–117)
BUN: 9 mg/dL (ref 6–23)
CO2: 27 mEq/L (ref 19–32)
Calcium: 9.4 mg/dL (ref 8.4–10.5)
Chloride: 105 mEq/L (ref 96–112)
Creatinine, Ser: 0.8 mg/dL (ref 0.40–1.20)
GFR: 82.54 mL/min (ref >60.00)
Glucose, Bld: 81 mg/dL (ref 70–99)
Potassium: 3.8 mEq/L (ref 3.5–5.1)
Sodium: 138 mEq/L (ref 135–145)
Total Bilirubin: 0.3 mg/dL (ref 0.2–1.2)
Total Protein: 7.2 g/dL (ref 6.0–8.3)

## 2020-12-02 LAB — LIPID PANEL
Cholesterol: 187 mg/dL (ref 0–200)
HDL: 58.6 mg/dL (ref >39.00)
LDL Cholesterol: 106 mg/dL — ABNORMAL HIGH (ref 0–99)
NonHDL: 128.21
Total CHOL/HDL Ratio: 3
Triglycerides: 112 mg/dL (ref 0.0–149.0)
VLDL: 22.4 mg/dL (ref 0.0–40.0)

## 2020-12-02 LAB — VITAMIN D 25 HYDROXY (VIT D DEFICIENCY, FRACTURES): VITD: 30.05 ng/mL (ref 30.00–100.00)

## 2020-12-02 LAB — TSH: TSH: 1.68 u[IU]/mL (ref 0.35–5.50)

## 2020-12-02 MED ORDER — NICOTINE 21 MG/24HR TD PT24: 21.0000 mg | MEDICATED_PATCH | Freq: Every day | TRANSDERMAL | 1 refills | Status: DC

## 2020-12-02 NOTE — Patient Instructions (Addendum)
It was great to see you!  Cardiology referral for swelling of your lower legs and low blood pressures Trial sugar-free gingerale Trial nicotine patches Next time you see Dr Adele Schilder, ask if you would be appropriate for Chantix and let me know  Schedule mammogram  You will be contacted about your chest CT  Please go to the lab for blood work.   Our office will call you with your results unless you have chosen to receive results via MyChart.  If your blood work is normal we will follow-up each year for physicals and as scheduled for chronic medical problems.  If anything is abnormal we will treat accordingly and get you in for a follow-up.  Take care,  Aldona Bar

## 2020-12-02 NOTE — Progress Notes (Signed)
Subjective:    [name] White is a 55 y.o. female and is here for a comprehensive physical exam.   HPI  Health Maintenance Due  Topic Date Due   Zoster Vaccines- Shingrix (1 of 2) Never done   INFLUENZA VACCINE  09/22/2020    Acute Concerns: Hypotension  -- blood pressures have been trending down.  She does have a prescription for Lasix 20 mg daily and states that she has been out of this for about 3 weeks.  When she does have a prescription for that she takes it every day.  If she does not take it every day she gets swelling in her lower legs.  We ordered an echocardiogram last year to further work-up her swelling in her lower legs but this was never done.  She is on multiple medications for chronic pain and her mental health.  She does not have any recent medication changes to this regimen however.  Denies: Lightheadedness, dizziness, chest pain or shortness of breath.  She is often having fatigue and sensation of feeling off balance at times.  BP Readings from Last 3 Encounters:  12/02/20 (!) 98/52  05/01/20 101/61  04/01/20 110/70   Fatigue --chronic issue for patient she is not able to be very mobile due to her ongoing hip issues.  She takes numerous medications for her mental health and chronic pain.  Denies any unintentional weight changes, rectal bleeding, unusual or new pain for patient.   Chronic Issues: Leg swelling--see above Tobacco abuse -she has been smoking intermittently since age 29.  On average she smokes a pack per day.  There are times that she was most successful with smoking cessation was when she was pregnant.  Has never been smoke-free for more than 1 to 2 years at a time.   Recently started smoking again. Currently 1-1.25 packs per day. GERD --currently prescribed Protonix 40 mg daily and symptoms are somewhat improved when she takes this medication. Hyperlipidemia --she is currently on pravastatin 10 mg daily and tolerates this well.  She is due for lipid  panel today. Vitamin D deficiency --patient has history of vitamin D deficiency and is currently not on a vitamin D supplement  Health Maintenance: Immunizations -- UTD Colonoscopy -- UTD, due 04/2030 Mammogram -- Due 05/2020 PAP -- N/A Bone Density -- N/A Diet --variable intake throughout the day due to decreased appetite at baseline and multiple intolerances, does drink a total of 4 x 20 oz bottles of gingerale daily Exercise -- very limited activity secondary to pain Weight -- Weight: 240 lb 3.2 oz (109 kg)  Weight history: Wt Readings from Last 10 Encounters:  12/02/20 240 lb 3.2 oz (109 kg)  10/07/20 245 lb (111.1 kg)  07/29/20 240 lb (108.9 kg)  04/30/20 240 lb (108.9 kg)  04/29/20 241 lb (109.3 kg)  04/01/20 241 lb (109.3 kg)  02/04/20 242 lb (109.8 kg)  02/01/20 242 lb (109.8 kg)  01/08/20 244 lb 6.4 oz (110.9 kg)  12/25/19 242 lb (109.8 kg)   Body mass index is 38.77 kg/m. No LMP recorded (lmp unknown). Patient has had a hysterectomy. Alcohol use:  reports current alcohol use of about 6.0 standard drinks per week. Tobacco use:  Tobacco Use: Medium Risk   Smoking Tobacco Use: Former   Smokeless Tobacco Use: Never     Depression screen PHQ 2/9 12/02/2020  Decreased Interest 0  Down, Depressed, Hopeless 0  PHQ - 2 Score 0  Altered sleeping -  Tired, decreased  energy -  Change in appetite -  Feeling bad or failure about yourself  -  Trouble concentrating -  Moving slowly or fidgety/restless -  Suicidal thoughts -  PHQ-9 Score -  Difficult doing work/chores -  Some recent data might be hidden   Needs to see dentist and eye doctor  Other providers/specialists: Patient Care Team: Inda Coke, [place] as PCP - General (Physician Assistant) Lafayette Dragon, MD (Inactive) as Consulting Physician (Gastroenterology) Maisie Fus, MD as Consulting Physician (Obstetrics and Gynecology) Nicholaus Bloom, MD (Anesthesiology) Rod Can, MD as Consulting Physician  (Orthopedic Surgery)    PMHx, SurgHx, SocialHx, Medications, and Allergies were reviewed in the Visit Navigator and updated as appropriate.   Past Medical History:  Diagnosis Date   Anterolisthesis    Anxiety    per pt, has panic attacks   Arthritis    in back per pt.   Arthritis of right hip    hips   Barrett's esophagus    egd 2007 + 2009 but none 2011 or 08/2010 egd   Bipolar disorder (HCC)    Chronic headache    Colon polyp    hyperplastic   Depression    hx of suicide attempt in 2003   GERD (gastroesophageal reflux disease)    Grief reaction    Hidradenitis suppurativa    IBS (irritable bowel syndrome)    Migraine    OSA (obstructive sleep apnea)    does not use cpap   Ovarian cyst    Restless leg    Tobacco abuse      Past Surgical History:  Procedure Laterality Date   ABDOMINAL HYSTERECTOMY     secondary to endometriosis   BACK SURGERY Right 2019   lumbar ablation L3-S1 x 2    CESAREAN SECTION     x 2   CHOLECYSTECTOMY     COLONOSCOPY W/ POLYPECTOMY  2012   COLONOSCOPY WITH PROPOFOL N/A 05/01/2020   Procedure: COLONOSCOPY WITH PROPOFOL;  Surgeon: Irving Copas., MD;  [facility] ENDOSCOPY;  Service: Gastroenterology;  Laterality: N/A;   ELBOW SURGERY     left   ENDOSCOPIC MUCOSAL RESECTION N/A 05/01/2020   Procedure: ENDOSCOPIC MUCOSAL RESECTION;  Surgeon: Rush Landmark Telford Nab., MD;  Location: [place];  Service: Gastroenterology;  Laterality: N/A;   HEMOSTASIS CLIP PLACEMENT  05/01/2020   Procedure: HEMOSTASIS CLIP PLACEMENT;  Surgeon: Irving Copas., MD;  Location: [place];  Service: Gastroenterology;;   LAPAROSCOPY  04-22-10   lysis of adhesions   LAPAROSCOPY  04-30-10    with mini laparotomy and cystotomy repair   plantar fascitis     right foot   POLYPECTOMY  05/01/2020   Procedure: POLYPECTOMY;  Surgeon: Irving Copas., MD;  Location: [place];  Service: Gastroenterology;;   SALPINGECTOMY  04-22-10   right    SUBMUCOSAL LIFTING INJECTION  05/01/2020   Procedure: SUBMUCOSAL LIFTING INJECTION;  Surgeon: Irving Copas., MD;  Location: [place];  Service: Gastroenterology;;   TOTAL HIP ARTHROPLASTY Right 04/25/2017   Procedure: RIGHT TOTAL HIP ARTHROPLASTY ANTERIOR APPROACH;  Surgeon: Rod Can, MD;  Location: [place];  Service: Orthopedics;  Laterality: Right;  Needs RNFA   TUBAL LIGATION     WRIST SURGERY     right/ganglion cyst     Family History  Problem Relation Age of Onset   COPD Mother    Stroke Mother    Coronary artery disease Mother        pacer   Colon polyps  Mother    Breast cancer Mother    Migraines Mother    Other Mother        RSDS   Rheum arthritis Mother    Hypertension Father    Colon polyps Brother    Colon cancer Maternal Grandfather    Colon polyps Maternal Grandfather    Breast cancer Maternal Aunt    Uterine cancer Cousin        x 2   Aneurysm Maternal Grandmother    Kidney disease Brother    Other Brother        back pain   Kidney cancer Paternal Aunt    Breast cancer Cousin    Stomach cancer Neg Hx    Pancreatic cancer Neg Hx    Esophageal cancer Neg Hx    Liver disease Neg Hx    Inflammatory bowel disease Neg Hx     Social History   Tobacco Use   Smoking status: Former    Packs/day: 0.50    Years: 30.00    Pack years: 15.00    Types: Cigarettes   Smokeless tobacco: Never   Tobacco comments:    quite 06/21/15 - started back smoking 03/2020  Vaping Use   Vaping Use: Never used  Substance Use Topics   Alcohol use: Yes    Alcohol/week: 6.0 standard drinks    Types: 6 Standard drinks or equivalent per week    Comment: wine/liquor 2 x a month   Drug use: No    Review of Systems:   Review of Systems  Constitutional:  Positive for malaise/fatigue. Negative for chills, fever and weight loss.  HENT:  Negative for hearing loss, sinus pain and sore throat.   Respiratory:  Negative for cough and hemoptysis.   Cardiovascular:   Positive for leg swelling. Negative for chest pain, palpitations and PND.  Gastrointestinal:  Negative for abdominal pain, constipation, diarrhea, heartburn, nausea and vomiting.  Genitourinary:  Negative for dysuria, frequency and urgency.  Musculoskeletal:  Negative for back pain, myalgias and neck pain.  Skin:  Negative for itching and rash.  Neurological:  Negative for dizziness, tingling, seizures and headaches.  Endo/Heme/Allergies:  Negative for polydipsia.  Psychiatric/Behavioral:  Negative for depression. The patient is not nervous/anxious.    Objective:   BP (!) 98/52   Pulse 75   Temp 97.9 F (36.6 C) (Temporal)   Wt 240 lb 3.2 oz (109 kg)   LMP  (LMP Unknown)   SpO2 99%   BMI 38.77 kg/m  Body mass index is 38.77 kg/m.   General Appearance:    Alert, cooperative, no distress, appears stated age  Head:    Normocephalic, without obvious abnormality, atraumatic  Eyes:    PERRL, conjunctiva/corneas clear, EOM's intact, fundi    benign, both eyes  Ears:    Normal TM's and external ear canals, both ears  Nose:   Nares normal, septum midline, mucosa normal, no drainage    or sinus tenderness  Throat:   Lips, mucosa, and tongue normal; teeth and gums normal  Neck:   Supple, symmetrical, trachea midline, no adenopathy;    thyroid:  no enlargement/tenderness/nodules; no carotid   bruit or JVD  Back:     Symmetric, no curvature, ROM normal, no CVA tenderness  Lungs:     Clear to auscultation bilaterally, respirations unlabored  Chest Wall:    No tenderness or deformity   Heart:    Regular rate and rhythm, S1 and S2 normal, no murmur, rub or gallop  Breast Exam:  Deferred  Abdomen:     Soft, non-tender, bowel sounds active all four quadrants,    no masses, no organomegaly  Genitalia:  Deferred  Extremities:   Extremities normal, atraumatic, no cyanosis or edema  Pulses:   2+ and symmetric all extremities  Skin:   Skin color, texture, turgor normal, no rashes or lesions   Lymph nodes:   Cervical, supraclavicular, and axillary nodes normal  Neurologic:   CNII-XII intact, normal strength, sensation and reflexes    throughout    Assessment/Plan:   Encounter for general adult medical examination with abnormal findings Today patient counseled on age appropriate routine health concerns for screening and prevention, each reviewed and up to date or declined. Immunizations reviewed and up to date or declined. Labs ordered and reviewed. Risk factors for depression reviewed and negative. Hearing function and visual acuity are intact. ADLs screened and addressed as needed. Functional ability and level of safety reviewed and appropriate. Education, counseling and referrals performed based on assessed risks today. Patient provided with a copy of personalized plan for preventive services.  Hypotension, unspecified hypotension type; Leg swelling Overall asymptomatic, does have baseline fatigue Suspect that this is related to medications, however due to ongoing leg swelling, I do think it would be worthwhile seeing cardiology Referral placed I recommend close monitoring of blood pressure with home monitor in the interim I asked patient to let us know if she develops any new symptoms in the meantime  Tobacco smoker, 1 pack of cigarettes or less per day Counseled today Will start nicotine patches and have patient follow-up with me in 1 month We could consider Wellbutrin but I would like for her to discuss this with her psychiatrist to see if this is something he would be agreeable to due to numerous medications that she is currently taking Will send for low-dose CT lung screening  Gastroesophageal reflux disease, unspecified whether esophagitis present Well-controlled with Protonix 40 mg daily Follow-up as needed  Obesity, unspecified classification, unspecified obesity type, unspecified whether serious comorbidity present Encouraged reduction of sugary  beverages  Fatigue, unspecified type We will suspect multifactorial due to polypharmacy and limited exercise ability, and obesity Update blood work to rule out organic cause  Vitamin D deficiency History of vitamin D deficiency, will update vitamin D and provide recommendations accordingly  Dyslipidemia Update lipid panel and provide recommendations to pravastatin 10 mg daily as indicated  Need for immunization against influenza Completed today    Patient Counseling: [x]    Nutrition: Stressed importance of moderation in sodium/caffeine intake, saturated fat and cholesterol, caloric balance, sufficient intake of fresh fruits, vegetables, fiber, calcium, iron, and 1 mg of folate supplement per day (for females capable of pregnancy).  [x]    Stressed the importance of regular exercise.   [x]    Substance Abuse: Discussed cessation/primary prevention of tobacco, alcohol, or other drug use; driving or other dangerous activities under the influence; availability of treatment for abuse.   [x]    Injury prevention: Discussed safety belts, safety helmets, smoke detector, smoking near bedding or upholstery.   [x]    Sexuality: Discussed sexually transmitted diseases, partner selection, use of condoms, avoidance of unintended pregnancy  and contraceptive alternatives.  [x]    Dental health: Discussed importance of regular tooth brushing, flossing, and dental visits.  [x]    Health maintenance and immunizations reviewed. Please refer to Health maintenance section.   Inda Coke, PA-C [place]

## 2020-12-03 ENCOUNTER — Other Ambulatory Visit: Payer: Self-pay | Admitting: Physician Assistant

## 2020-12-03 ENCOUNTER — Encounter: Payer: Self-pay | Admitting: Physician Assistant

## 2020-12-03 MED ORDER — PRAVASTATIN SODIUM 20 MG PO TABS: 20.0000 mg | ORAL_TABLET | Freq: Every day | ORAL | 1 refills | Status: DC

## 2020-12-04 ENCOUNTER — Ambulatory Visit (INDEPENDENT_AMBULATORY_CARE_PROVIDER_SITE_OTHER): Payer: Medicare Other

## 2020-12-04 ENCOUNTER — Other Ambulatory Visit: Payer: Self-pay

## 2020-12-04 DIAGNOSIS — E538 Deficiency of other specified B group vitamins: Secondary | ICD-10-CM

## 2020-12-04 MED ORDER — CYANOCOBALAMIN 1000 MCG/ML IJ SOLN
1000.0000 ug | Freq: Once | INTRAMUSCULAR | Status: AC
  Administered 2020-12-04: 1000 ug via INTRAMUSCULAR

## 2020-12-04 NOTE — Progress Notes (Signed)
[name] White, 55 year old female presents in office today for b12 injection. Tolerated injection well, is aware to return next week for 2nd dose.

## 2020-12-05 ENCOUNTER — Other Ambulatory Visit: Payer: Self-pay | Admitting: Physician Assistant

## 2020-12-05 DIAGNOSIS — Z1231 Encounter for screening mammogram for malignant neoplasm of breast: Secondary | ICD-10-CM

## 2020-12-08 ENCOUNTER — Other Ambulatory Visit: Payer: Self-pay | Admitting: Neurology

## 2020-12-09 NOTE — Telephone Encounter (Signed)
No refills until seen has appt in November 16

## 2020-12-11 ENCOUNTER — Ambulatory Visit (INDEPENDENT_AMBULATORY_CARE_PROVIDER_SITE_OTHER): Payer: Medicare Other

## 2020-12-11 ENCOUNTER — Other Ambulatory Visit: Payer: Self-pay

## 2020-12-11 DIAGNOSIS — E538 Deficiency of other specified B group vitamins: Secondary | ICD-10-CM | POA: Diagnosis not present

## 2020-12-11 MED ORDER — CYANOCOBALAMIN 1000 MCG/ML IJ SOLN
1000.0000 ug | Freq: Once | INTRAMUSCULAR | Status: AC
  Administered 2020-12-11: 1000 ug via INTRAMUSCULAR

## 2020-12-11 NOTE — Progress Notes (Signed)
[name] White, 55 year old female presents in office today for second b12 injection per Inda Coke, PA. Tolerated injection well, aware to return next week for third injection

## 2020-12-18 ENCOUNTER — Other Ambulatory Visit: Payer: Self-pay

## 2020-12-18 ENCOUNTER — Ambulatory Visit (INDEPENDENT_AMBULATORY_CARE_PROVIDER_SITE_OTHER): Payer: Medicare Other

## 2020-12-18 DIAGNOSIS — E538 Deficiency of other specified B group vitamins: Secondary | ICD-10-CM

## 2020-12-18 MED ORDER — CYANOCOBALAMIN 1000 MCG/ML IJ SOLN
1000.0000 ug | Freq: Once | INTRAMUSCULAR | Status: AC
  Administered 2020-12-18: 1000 ug via INTRAMUSCULAR

## 2020-12-18 NOTE — Progress Notes (Signed)
After obtaining consent, and per orders of Inda Coke , injection of vitamin b 12  given by Loralyn Freshwater.

## 2021-01-03 ENCOUNTER — Other Ambulatory Visit: Payer: Self-pay | Admitting: Neurology

## 2021-01-05 ENCOUNTER — Other Ambulatory Visit: Payer: Self-pay

## 2021-01-05 ENCOUNTER — Ambulatory Visit (INDEPENDENT_AMBULATORY_CARE_PROVIDER_SITE_OTHER): Payer: Medicare Other | Admitting: Physician Assistant

## 2021-01-05 ENCOUNTER — Ambulatory Visit
Admission: RE | Admit: 2021-01-05 | Discharge: 2021-01-05 | Disposition: A | Discharge status: Home / Self Care | Payer: Medicare Other | Source: Ambulatory Visit | Attending: Physician Assistant | Admitting: Physician Assistant

## 2021-01-05 ENCOUNTER — Encounter: Payer: Self-pay | Admitting: Physician Assistant

## 2021-01-05 VITALS — BP 100/62 | HR 65 | Temp 98.1°F | Ht 66.0 in | Wt 235.5 lb

## 2021-01-05 DIAGNOSIS — Z1231 Encounter for screening mammogram for malignant neoplasm of breast: Secondary | ICD-10-CM

## 2021-01-05 DIAGNOSIS — R5383 Other fatigue: Secondary | ICD-10-CM | POA: Diagnosis not present

## 2021-01-05 DIAGNOSIS — F17210 Nicotine dependence, cigarettes, uncomplicated: Secondary | ICD-10-CM

## 2021-01-05 NOTE — Patient Instructions (Addendum)
It was great to see you!  Please ask your psychiatrist if we can start Chantix/Varenicline  Initial Chantix instructions: Set a quit date at least 7 days in the future. You should continue smoking for at least 7 days while taking the Chantix. Take 1/2 tablet once daily for 4 days, then 1/2 tablet twice daily for 4 days, then 1/2 tablet each morning and 1 whole tablet each evening for 4 days, then take 1 whole tablet twice daily.  Let me know if he says yes or no and we will go from there  Quit Smoking Hotline:  800-QUIT-NOW (220)861-3613) -- see if you can get some free resources through this  Take care,  Inda Coke PA-C

## 2021-01-05 NOTE — Progress Notes (Signed)
[name] White is a 55 y.o. female here for a follow up of nicotine dependence.    History of Present Illness:   Chief Complaint  Patient presents with   Nicotine Dependence    HPI  Nicotine Dependence During our previous visit on 12/02/20, [name] White expressed she was interested in stopping tobacco use but found it difficult to do for a long period of time. She had reported she had been able to remain smoke free for 1-2 years max, usually when she was pregnant. At that time she stated she was smoking about 1-1.25 packs per day. Following this visit [name] White agreed to trial Nicoderm 21 mg 24 hr patch and was interested in talking with her psychiatrist about potentially starting chantix.  Currently she has not started the nicotine patches due to the cost and being between paychecks at this time. She is interested in finding something less expensive to trial versus starting the nicotine patches. Despite this she has reported a decrease in nicotine use. She went from smoking 1.50 packs per day to now just one pack a day, for the past three weeks. At this time she is set to see her psychiatrist next week and follow up on lung screening with Dr.Turner on 01/19/21.   Fatigue [name] White expressed concern that she is still experiencing fatigue despite receiving b12 injections which she initially believed to the that causeof her fatigue. As the visit continued she admitted that she finds it hard to go to sleep at night due to racing thoughts that keep her awake. She describes it as not being able to turn her mind off. [name] White has discussed this with her psychiatrist who in turn prescribed her with trazadone 100 mg, which has only provided her with minor relief.   Past Medical History:  Diagnosis Date   Anterolisthesis    Anxiety    per pt, has panic attacks   Arthritis    in back per pt.   Arthritis of right hip    hips   Barrett's esophagus    egd 2007 + 2009 but none 2011 or 08/2010 egd   Bipolar disorder  ([place])    Chronic headache    Colon polyp    hyperplastic   Depression    hx of suicide attempt in 2003   GERD (gastroesophageal reflux disease)    Grief reaction    Hidradenitis suppurativa    IBS (irritable bowel syndrome)    Migraine    OSA (obstructive sleep apnea)    does not use cpap   Ovarian cyst    Restless leg    Tobacco abuse      Social History   Tobacco Use   Smoking status: Every Day    Packs/day: 1.00    Years: 40.00    Pack years: 40.00    Types: Cigarettes   Smokeless tobacco: Never   Tobacco comments:    quit 06/21/15 - started back smoking 03/2020  Vaping Use   Vaping Use: Never used  Substance Use Topics   Alcohol use: Yes    Alcohol/week: 6.0 standard drinks    Types: 6 Standard drinks or equivalent per week    Comment: wine/liquor 2 x a month   Drug use: No    Past Surgical History:  Procedure Laterality Date   ABDOMINAL HYSTERECTOMY     secondary to endometriosis   BACK SURGERY Right 2019   lumbar ablation L3-S1 x 2    CESAREAN SECTION     x  2   CHOLECYSTECTOMY     COLONOSCOPY W/ POLYPECTOMY  2012   COLONOSCOPY WITH PROPOFOL N/A 05/01/2020   Procedure: COLONOSCOPY WITH PROPOFOL;  Surgeon: Rush Landmark Telford Nab., MD;  Location: [facility];  Service: Gastroenterology;  Laterality: N/A;   ELBOW SURGERY     left   ENDOSCOPIC MUCOSAL RESECTION N/A 05/01/2020   Procedure: ENDOSCOPIC MUCOSAL RESECTION;  Surgeon: Rush Landmark Telford Nab., MD;  Location: [facility];  Service: Gastroenterology;  Laterality: N/A;   HEMOSTASIS CLIP PLACEMENT  05/01/2020   Procedure: HEMOSTASIS CLIP PLACEMENT;  Surgeon: Irving Copas., MD;  Location: [facility];  Service: Gastroenterology;;   LAPAROSCOPY  04-22-10   lysis of adhesions   LAPAROSCOPY  04-30-10    with mini laparotomy and cystotomy repair   plantar fascitis     right foot   POLYPECTOMY  05/01/2020   Procedure: POLYPECTOMY;  Surgeon: Irving Copas., MD;  Location: [facility];   Service: Gastroenterology;;   SALPINGECTOMY  04-22-10   right   SUBMUCOSAL LIFTING INJECTION  05/01/2020   Procedure: SUBMUCOSAL LIFTING INJECTION;  Surgeon: Irving Copas., MD;  Location: [facility];  Service: Gastroenterology;;   TOTAL HIP ARTHROPLASTY Right 04/25/2017   Procedure: RIGHT TOTAL HIP ARTHROPLASTY ANTERIOR APPROACH;  Surgeon: Rod Can, MD;  Location: [facility];  Service: Orthopedics;  Laterality: Right;  Needs RNFA   TUBAL LIGATION     WRIST SURGERY     right/ganglion cyst    Family History  Problem Relation Age of Onset   COPD Mother    Stroke Mother    Coronary artery disease Mother        pacer   Colon polyps Mother    Breast cancer Mother    Migraines Mother    Other Mother        RSDS   Rheum arthritis Mother    Hypertension Father    Colon polyps Brother    Colon cancer Maternal Grandfather    Colon polyps Maternal Grandfather    Breast cancer Maternal Aunt    Uterine cancer Cousin        x 2   Aneurysm Maternal Grandmother    Kidney disease Brother    Other Brother        back pain   Kidney cancer Paternal Aunt    Breast cancer Cousin    Stomach cancer Neg Hx    Pancreatic cancer Neg Hx    Esophageal cancer Neg Hx    Liver disease Neg Hx    Inflammatory bowel disease Neg Hx     Allergies  Allergen Reactions   Morphine Hives and Nausea And Vomiting    INTOLERANCE >  N & V   Penicillins Hives, Itching and Other (See Comments)     PATIENT HAS HAD A PCN REACTION WITH IMMEDIATE RASH, FACIAL/TONGUE/THROAT SWELLING, SOB, OR LIGHTHEADEDNESS WITH HYPOTENSION:  #  #  #  YES  #  #  #   Has patient had a PCN reaction causing severe rash involving mucus membranes or skin necrosis: Unknown Has patient had a PCN reaction that required hospitalization: No Has patient had a PCN reaction occurring within the last 10 years: No If all of the above answers are "NO", then may proceed with Cephalosporin use.     Current Medications:   Current  Outpatient Medications:    ARIPiprazole (ABILIFY) 5 MG tablet, Take 1 tablet (5 mg total) by mouth daily., Disp: 30 tablet, Rfl: 2   dicyclomine (BENTYL) 10 MG capsule,  TAKE 1 CAPSULE (10 MG TOTAL) BY MOUTH 4 (FOUR) TIMES DAILY - BEFORE MEALS AND AT BEDTIME., Disp: 360 capsule, Rfl: 0   DULoxetine (CYMBALTA) 60 MG capsule, Take 1 capsule (60 mg total) by mouth at bedtime., Disp: 30 capsule, Rfl: 2   furosemide (LASIX) 20 MG tablet, TAKE 1 TABLET BY MOUTH DAILY AS NEEDED FOR FLUID., Disp: 90 tablet, Rfl: 0   HYDROmorphone (DILAUDID) 2 MG tablet, Take 2 mg by mouth 5 (five) times daily., Disp: , Rfl:    lamoTRIgine (LAMICTAL) 200 MG tablet, Take 1 tablet (200 mg total) by mouth at bedtime., Disp: 30 tablet, Rfl: 2   LINZESS 145 MCG CAPS capsule, Take 145 mcg by mouth daily before breakfast., Disp: , Rfl:    pantoprazole (PROTONIX) 40 MG tablet, TAKE 1 TABLET BY MOUTH EVERY DAY, Disp: 90 tablet, Rfl: 1   pravastatin (PRAVACHOL) 20 MG tablet, Take 1 tablet (20 mg total) by mouth daily., Disp: 90 tablet, Rfl: 1   rizatriptan (MAXALT-MLT) 10 MG disintegrating tablet, Take 1 tablet (10 mg total) by mouth as needed for migraine. May repeat in 2 hours if needed.  Maximum 2 tablets in 24 hours, Disp: 12 tablet, Rfl: 2   rOPINIRole (REQUIP) 1 MG tablet, TAKE 2 TABLETS (2 MG TOTAL) BY MOUTH AT BEDTIME., Disp: 180 tablet, Rfl: 1   topiramate (TOPAMAX) 200 MG tablet, TAKE 1 TABLET BY MOUTH EVERYDAY AT BEDTIME, Disp: 29 tablet, Rfl: 0   traZODone (DESYREL) 100 MG tablet, Take 1 tablet (100 mg total) by mouth at bedtime as needed for sleep., Disp: 30 tablet, Rfl: 2   nicotine (NICODERM CQ - DOSED IN MG/24 HOURS) 21 mg/24hr patch, Place 1 patch (21 mg total) onto the skin daily. (Patient not taking: Reported on 01/05/2021), Disp: 28 patch, Rfl: 1   ondansetron (ZOFRAN) 4 MG tablet, Take 4 mg by mouth every 8 (eight) hours as needed for vomiting or nausea. (Patient not taking: Reported on 01/05/2021), Disp: , Rfl:     Review of Systems:   ROS Negative unless otherwise specified per HPI. Vitals:   Vitals:   01/05/21 1110  BP: 100/62  Pulse: 65  Temp: 98.1 F (36.7 C)  TempSrc: Temporal  SpO2: 99%  Weight: 235 lb 8 oz (106.8 kg)  Height: 5\' 6"  (1.676 m)     Body mass index is 38.01 kg/m.  Physical Exam:   Physical Exam Vitals and nursing note reviewed.  Constitutional:      General: She is not in acute distress.    Appearance: She is well-developed. She is not ill-appearing or toxic-appearing.  Cardiovascular:     Rate and Rhythm: Normal rate and regular rhythm.     Pulses: Normal pulses.     Heart sounds: Normal heart sounds, S1 normal and S2 normal.  Pulmonary:     Effort: Pulmonary effort is normal.     Breath sounds: Normal breath sounds.  Skin:    General: Skin is warm and dry.  Neurological:     Mental Status: She is alert.     GCS: GCS eye subscore is 4. GCS verbal subscore is 5. GCS motor subscore is 6.  Psychiatric:        Speech: Speech normal.        Behavior: Behavior normal. Behavior is cooperative.    Assessment and Plan:   Tobacco smoker, 1 pack of cigarettes or less per day Uncontrolled I do think she would benefit from Chantix Out of an abundance of caution,  due to her ongoing issues with her mental health and use of several chronic medications, will have her ask her psychiatrist if starting Varenicline/Chantix is permitted If so, will start the Chantix start pack for patient and have her follow-up as appropriate  Fatigue, unspecified type Ongoing May discontinue B12 injection if she is not finding it helpful; she is planning to start B12 oral supplements Suspect this is due to polypharmacy, however she is confident in her specialists and does not feel like a medication change to her pain or  mental health regimen is warranted at this time Continue efforts at healthy lifestyle   I,Havlyn C Ratchford,acting as a scribe for [employer], PA.,have  documented all relevant documentation on the behalf of Inda Coke, PA,as directed by  Inda Coke, PA while in the presence of Inda Coke, [place].   I, Inda Coke, [place], have reviewed all documentation for this visit. The documentation on 01/05/21 for the exam, diagnosis, procedures, and orders are all accurate and complete.   Inda Coke, PA-C

## 2021-01-06 NOTE — Progress Notes (Signed)
Virtual Visit via Video Note The purpose of this virtual visit is to provide medical care while limiting exposure to the novel coronavirus.    Consent was obtained for video visit:  Yes.   Answered questions that patient had about telehealth interaction:  Yes.   I discussed the limitations, risks, security and privacy concerns of performing an evaluation and management service by telemedicine. I also discussed with the patient that there may be a patient responsible charge related to this service. The patient expressed understanding and agreed to proceed.  Pt location: Home Physician Location: office Name of referring provider:  Inda Coke, [place] I connected with Solomiya Pascale Shindler at patients initiation/request on 01/07/2021 at 10:30 AM EST by video enabled telemedicine application and verified that I am speaking with the correct person using two identifiers. Pt MRN:  [id number] Pt DOB:  23-Nov-1965 Video Participants:  Caren Griffins D Castelo  Assessment and Plan:   Migraine without aura, without status migrainosus, not intractable OSA on CPAP   Migraine prevention:  topiramate 200mg  at bedtime Migraine rescue:  rizatriptan 10mg , Zofran 4mg .  I will have her come by to pick up samples of Nurtec to try and see if we can shorten migraine duration further. Limit use of pain relievers to no more than 2 days out of week to prevent risk of rebound or medication-overuse headache. 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   UPDATE: Intensity:  Severe Duration:  3 hours with rizatriptan Frequency:  3 a month Current NSAIDS: None Current analgesics: Dilaudid Current triptans: Maxalt 10mg  Current ergotamine: None Current anti-emetic: Zofran Current muscle relaxants: None Current anti-anxiolytic: none Current sleep aide: trazodone Current  Antihypertensive medications: None Current Antidepressant medications: Cymbalta 60mg  daily, trazodone Current Anticonvulsant medications: Topiramate 200 mg at bedtime, lamotrigine  Current anti-CGRP: None Current Vitamins/Herbal/Supplements: None Current Antihistamines/Decongestants: None Other therapy: None Hormone/birth control: none Other meds:  Abilify, ropinirole   Caffeine: No Diet: Increased water, decrease chocolate Exercise: No Depression: Yes; Anxiety: Yes Other pain: Back and hip pain Sleep hygiene: Improved with CPAP   HISTORY: Onset: Around early 59s Location:  Top of head, band-like distribution Quality:  squeezing Initial Intensity:  7/10 (sometimes 10/10):  She denies new headache, worst headache of her life or severe headache that wakes her up from sleep. Aura:  no Prodrome:  no Associated symptoms: Nausea, photophobia, phonophobia.  She denies unilateral numbness or weakness. Initial Duration:  4 hours or sometimes up to a day Initial Frequency:  3 to 4 times a month Triggers: Tea Relieving factors:  Sleep in dark Activity:  Needs to lay down to sleep   Past NSAIDS:  ibuprofen Past analgesics:  Tylenol, Excedrin Past abortive triptans:  sumatriptan 100mg  Past muscle relaxants:no   Past anti-emetic:  Zofran 8mg , Phenergan Past sleep aide:  melatonin, Ambien Past antihypertensive medications:  no Past antidepressant medications:  venlafaxine XR 150mg  (for depression).   Past anticonvulsant medications:  gabapentin 1200mg  three times daily, Lyrica 100mg  three times daily (both for neuropathic nerve pain from hip surgery)     Family history of headache:  Mother and grandmother had migraines.  Grandmother had aneurysm.   In 2017, she was reporting episodes of dizziness that occur suddenly, usually while walking.  It is not positional.  It is not spinning but rather a wave-like movement.  There is no  associated nausea, double vision, focal numbness.  It lasts  for a few minutes and resolves.  It occurs twice a day.  She takes meclizine, which is not too effective. CTA of head was unremarkable, revealing no vertebrobasilar insufficiency.  Etiology did not seem neurologic.   MRI of brain without contrast from 06/05/15 and showed complex enlarged pineal gland over 1.9 cm with mild flattening of the superior colliculus.  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    Repeat MRI of the brain with and without contrast from 07/26/16 again demonstrated stable non-enhancing pineal gland cyst, but otherwise unremarkable except for some paranasal sinus disease.  Past Medical History: Past Medical History:  Diagnosis Date   Anterolisthesis    Anxiety    per pt, has panic attacks   Arthritis    in back per pt.   Arthritis of right hip    hips   Barrett's esophagus    egd 2007 + 2009 but none 2011 or 08/2010 egd   Bipolar disorder ([place])    Chronic headache    Colon polyp    hyperplastic   Depression    hx of suicide attempt in 2003   GERD (gastroesophageal reflux disease)    Grief reaction    Hidradenitis suppurativa    IBS (irritable bowel syndrome)    Migraine    OSA (obstructive sleep apnea)    does not use cpap   Ovarian cyst    Restless leg    Tobacco abuse     Medications: Outpatient Encounter Medications as of 01/07/2021  Medication Sig Note   ARIPiprazole (ABILIFY) 5 MG tablet Take 1 tablet (5 mg total) by mouth daily.    dicyclomine (BENTYL) 10 MG capsule TAKE 1 CAPSULE (10 MG TOTAL) BY MOUTH 4 (FOUR) TIMES DAILY - BEFORE MEALS AND AT BEDTIME.    DULoxetine (CYMBALTA) 60 MG capsule Take 1 capsule (60 mg total) by mouth at bedtime.    furosemide (LASIX) 20 MG tablet TAKE 1 TABLET BY MOUTH DAILY AS NEEDED FOR FLUID.    HYDROmorphone (DILAUDID) 2 MG tablet Take 2 mg by mouth 5 (five) times daily.    lamoTRIgine (LAMICTAL) 200 MG tablet  Take 1 tablet (200 mg total) by mouth at bedtime.    LINZESS 145 MCG CAPS capsule Take 145 mcg by mouth daily before breakfast.    nicotine (NICODERM CQ - DOSED IN MG/24 HOURS) 21 mg/24hr patch Place 1 patch (21 mg total) onto the skin daily. (Patient not taking: Reported on 01/05/2021)    ondansetron (ZOFRAN) 4 MG tablet Take 4 mg by mouth every 8 (eight) hours as needed for vomiting or nausea. (Patient not taking: Reported on 01/05/2021) 01/05/2021: Ran out   pantoprazole (PROTONIX) 40 MG tablet TAKE 1 TABLET BY MOUTH EVERY DAY    pravastatin (PRAVACHOL) 20 MG tablet Take 1 tablet (20 mg total) by mouth daily.    rizatriptan (MAXALT-MLT) 10 MG disintegrating tablet Take 1 tablet (10 mg total) by mouth as needed for migraine. May repeat in 2 hours if needed.  Maximum 2 tablets in 24 hours    rOPINIRole (REQUIP) 1 MG tablet TAKE 2 TABLETS (2 MG TOTAL) BY MOUTH AT BEDTIME.    topiramate (TOPAMAX) 200 MG tablet TAKE 1 TABLET BY MOUTH EVERYDAY AT BEDTIME    traZODone (DESYREL) 100 MG tablet Take 1 tablet (100 mg total) by  mouth at bedtime as needed for sleep.    No facility-administered encounter medications on file as of 01/07/2021.    Allergies: Allergies  Allergen Reactions   Morphine Hives and Nausea And Vomiting    INTOLERANCE >  N & V   Penicillins Hives, Itching and Other (See Comments)     PATIENT HAS HAD A PCN REACTION WITH IMMEDIATE RASH, FACIAL/TONGUE/THROAT SWELLING, SOB, OR LIGHTHEADEDNESS WITH HYPOTENSION:  #  #  #  YES  #  #  #   Has patient had a PCN reaction causing severe rash involving mucus membranes or skin necrosis: Unknown Has patient had a PCN reaction that required hospitalization: No Has patient had a PCN reaction occurring within the last 10 years: No If all of the above answers are "NO", then may proceed with Cephalosporin use.     Family History: Family History  Problem Relation Age of Onset   COPD Mother    Stroke Mother    Coronary artery disease Mother         pacer   Colon polyps Mother    Breast cancer Mother    Migraines Mother    Other Mother        RSDS   Rheum arthritis Mother    Hypertension Father    Colon polyps Brother    Colon cancer Maternal Grandfather    Colon polyps Maternal Grandfather    Breast cancer Maternal Aunt    Uterine cancer Cousin        x 2   Aneurysm Maternal Grandmother    Kidney disease Brother    Other Brother        back pain   Kidney cancer Paternal Aunt    Breast cancer Cousin    Stomach cancer Neg Hx    Pancreatic cancer Neg Hx    Esophageal cancer Neg Hx    Liver disease Neg Hx    Inflammatory bowel disease Neg Hx     Observations/Objective:   There were no vitals taken for this visit. No acute distress.  Alert and oriented.  Speech fluent and not dysarthric.  Language intact.  Eyes orthophoric on primary gaze.  Face symmetric.   Follow Up Instructions:    -I discussed the assessment and treatment plan with the patient. The patient was provided an opportunity to ask questions and all were answered. The patient agreed with the plan and demonstrated an understanding of the instructions.   The patient was advised to call back or seek an in-person evaluation if the symptoms worsen or if the condition fails to improve as anticipated.   [name] Major, DO

## 2021-01-07 ENCOUNTER — Encounter: Payer: Self-pay | Admitting: Physician Assistant

## 2021-01-07 ENCOUNTER — Encounter: Payer: Self-pay | Admitting: Neurology

## 2021-01-07 ENCOUNTER — Telehealth (HOSPITAL_BASED_OUTPATIENT_CLINIC_OR_DEPARTMENT_OTHER): Payer: Medicare Other | Admitting: Psychiatry

## 2021-01-07 ENCOUNTER — Telehealth (INDEPENDENT_AMBULATORY_CARE_PROVIDER_SITE_OTHER): Payer: Medicare Other | Admitting: Neurology

## 2021-01-07 ENCOUNTER — Encounter (HOSPITAL_COMMUNITY): Payer: Self-pay | Admitting: Psychiatry

## 2021-01-07 ENCOUNTER — Other Ambulatory Visit: Payer: Self-pay

## 2021-01-07 DIAGNOSIS — F319 Bipolar disorder, unspecified: Secondary | ICD-10-CM

## 2021-01-07 DIAGNOSIS — G43009 Migraine without aura, not intractable, without status migrainosus: Secondary | ICD-10-CM

## 2021-01-07 DIAGNOSIS — F4310 Post-traumatic stress disorder, unspecified: Secondary | ICD-10-CM | POA: Diagnosis not present

## 2021-01-07 DIAGNOSIS — G4733 Obstructive sleep apnea (adult) (pediatric): Secondary | ICD-10-CM | POA: Diagnosis not present

## 2021-01-07 MED ORDER — ARIPIPRAZOLE 5 MG PO TABS
5.0000 mg | ORAL_TABLET | Freq: Every day | ORAL | 0 refills | Status: DC
Start: 2021-01-07 — End: 2021-04-08

## 2021-01-07 MED ORDER — DULOXETINE HCL 60 MG PO CPEP: 60.0000 mg | ORAL_CAPSULE | Freq: Every day | ORAL | 0 refills | Status: DC

## 2021-01-07 MED ORDER — TRAZODONE HCL 150 MG PO TABS: 150.0000 mg | ORAL_TABLET | Freq: Every evening | ORAL | 0 refills | Status: DC | PRN

## 2021-01-07 MED ORDER — LAMOTRIGINE 200 MG PO TABS: 200.0000 mg | ORAL_TABLET | Freq: Every day | ORAL | 0 refills | Status: DC
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
## 2021-01-19 ENCOUNTER — Ambulatory Visit: Payer: Medicare Other | Admitting: Cardiology

## 2021-01-19 ENCOUNTER — Encounter: Payer: Self-pay | Admitting: Cardiology

## 2021-01-19 ENCOUNTER — Other Ambulatory Visit: Payer: Self-pay

## 2021-01-19 VITALS — BP 120/62 | HR 84 | Ht 64.0 in | Wt 238.0 lb

## 2021-01-19 DIAGNOSIS — R600 Localized edema: Secondary | ICD-10-CM | POA: Diagnosis not present

## 2021-01-19 DIAGNOSIS — R0609 Other forms of dyspnea: Secondary | ICD-10-CM

## 2021-01-19 DIAGNOSIS — R079 Chest pain, unspecified: Secondary | ICD-10-CM

## 2021-01-19 MED ORDER — METOPROLOL TARTRATE 100 MG PO TABS: 100.0000 mg | ORAL_TABLET | Freq: Once | ORAL | 0 refills | Status: DC

## 2021-01-19 NOTE — Addendum Note (Signed)
Addended by: Antonieta Iba on: 01/19/2021 10:48 AM   Modules accepted: Orders

## 2021-01-19 NOTE — Progress Notes (Signed)
Cardiology CONSULT Note    Date:  01/19/2021   ID:  [name], DOB 07/21/1965, MRN [id number]  PCP:  Inda Coke, PA  Cardiologist:  Fransico Him, MD   Chief Complaint  Patient presents with   New Patient (Initial Visit)    LE edema     History of Present Illness:  [name] White is a 55 y.o. female who is being seen today for the evaluation of LE edema at the request of Inda Coke, [place].  This is a 55yo female with a hx of obesity, anxiety with panic attacks, barrett's esophagus, Bipolar disorder, depression, GERD and OSA (not using CPAP) who is referred today for evaluation of LE edema.  She tells me that her edema has been going on for about 1 year.  She does admit to using salt when cooking but not at the table.  She eats sausage but no salty snacks. She has OSA but did not tolerate the PAP device with severe dry mouth with both the nasal and FFM masks.   She says that she has had a few episodes of chest discomfort that is nonexertional and describes it as a pressure midsternal with no radiation.  She says that she will break out in a sweat with the discomfort.  The pressure lasts and few minutes and it resolves on its own.  She has noticed problems with DOE for months.  This occurs with exertion and with eating.  She denies any dizziness, syncope or palpitations.    Past Medical History:  Diagnosis Date   Anterolisthesis    Anxiety    per pt, has panic attacks   Arthritis    in back per pt.   Arthritis of right hip    hips   Barrett's esophagus    egd 2007 + 2009 but none 2011 or 08/2010 egd   Bipolar disorder (HCC)    Chronic headache    Colon polyp    hyperplastic   Depression    hx of suicide attempt in 2003   GERD (gastroesophageal reflux disease)    Grief reaction    Hidradenitis suppurativa    IBS (irritable bowel syndrome)    Migraine    OSA (obstructive sleep apnea)    does not use cpap   Ovarian cyst    Restless leg    Tobacco abuse      Past Surgical History:  Procedure Laterality Date   ABDOMINAL HYSTERECTOMY     secondary to endometriosis   BACK SURGERY Right 2019   lumbar ablation L3-S1 x 2    CESAREAN SECTION     x 2   CHOLECYSTECTOMY     COLONOSCOPY W/ POLYPECTOMY  2012   COLONOSCOPY WITH PROPOFOL N/A 05/01/2020   Procedure: COLONOSCOPY WITH PROPOFOL;  Surgeon: Irving Copas., MD;  Location: [facility];  Service: Gastroenterology;  Laterality: N/A;   ELBOW SURGERY     left   ENDOSCOPIC MUCOSAL RESECTION N/A 05/01/2020   Procedure: ENDOSCOPIC MUCOSAL RESECTION;  Surgeon: Rush Landmark [place]elf[place] Nab., MD;  Location: [place];  Service: Gastroenterology;  Laterality: N/A;   HEMOSTASIS CLIP PLACEMENT  05/01/2020   Procedure: HEMOSTASIS CLIP PLACEMENT;  Surgeon: Irving Copas., MD;  Location: [place];  Service: Gastroenterology;;   LAPAROSCOPY  04-22-10   lysis of adhesions   LAPAROSCOPY  04-30-10    with mini laparotomy and cystotomy repair   plantar fascitis     right foot   POLYPECTOMY  05/01/2020  Procedure: POLYPECTOMY;  Surgeon: Rush Landmark Telford Nab., MD;  Location: [place];  Service: Gastroenterology;;   SALPINGECTOMY  04-22-10   right   SUBMUCOSAL LIFTING INJECTION  05/01/2020   Procedure: SUBMUCOSAL LIFTING INJECTION;  Surgeon: Irving Copas., MD;  Location: [place];  Service: Gastroenterology;;   TOTAL HIP ARTHROPLASTY Right 04/25/2017   Procedure: RIGHT TOTAL HIP ARTHROPLASTY ANTERIOR APPROACH;  Surgeon: Rod Can, MD;  Location: [place];  Service: Orthopedics;  Laterality: Right;  Needs RNFA   TUBAL LIGATION     WRIST SURGERY     right/ganglion cyst    Current Medications: Current Meds  Medication Sig   ARIPiprazole (ABILIFY) 5 MG tablet Take 1 tablet (5 mg total) by mouth daily.   dicyclomine (BENTYL) 10 MG capsule TAKE 1 CAPSULE (10 MG TOTAL) BY MOUTH 4 (FOUR) TIMES DAILY - BEFORE MEALS AND AT BEDTIME.   DULoxetine (CYMBALTA) 60 MG capsule Take 1  capsule (60 mg total) by mouth at bedtime.   furosemide (LASIX) 20 MG tablet TAKE 1 TABLET BY MOUTH DAILY AS NEEDED FOR FLUID.   HYDROmorphone (DILAUDID) 2 MG tablet Take 2 mg by mouth 5 (five) times daily.   lamoTRIgine (LAMICTAL) 200 MG tablet Take 1 tablet (200 mg total) by mouth at bedtime.   LINZESS 145 MCG CAPS capsule Take 145 mcg by mouth daily before breakfast.   ondansetron (ZOFRAN) 4 MG tablet Take 4 mg by mouth every 8 (eight) hours as needed for vomiting or nausea.   pantoprazole (PROTONIX) 40 MG tablet TAKE 1 TABLET BY MOUTH EVERY DAY   pravastatin (PRAVACHOL) 20 MG tablet Take 1 tablet (20 mg total) by mouth daily.   rizatriptan (MAXALT-MLT) 10 MG disintegrating tablet Take 1 tablet (10 mg total) by mouth as needed for migraine. May repeat in 2 hours if needed.  Maximum 2 tablets in 24 hours   rOPINIRole (REQUIP) 1 MG tablet TAKE 2 TABLETS (2 MG TOTAL) BY MOUTH AT BEDTIME.   topiramate (TOPAMAX) 200 MG tablet TAKE 1 TABLET BY MOUTH EVERYDAY AT BEDTIME   traZODone (DESYREL) 150 MG tablet Take 1 tablet (150 mg total) by mouth at bedtime as needed for sleep.    Allergies:   Morphine and Penicillins   Social History   Socioeconomic History   Marital status: Divorced    Spouse name: Not on file   Number of children: 2   Years of education: Not on file   Highest education level: Not on file  Occupational History   Occupation: Bartender  Tobacco Use   Smoking status: Every Day    Packs/day: 1.00    Years: 40.00    Pack years: 40.00    Types: Cigarettes   Smokeless tobacco: Never   Tobacco comments:    quit 06/21/15 - started back smoking 03/2020  Vaping Use   Vaping Use: Never used  Substance and Sexual Activity   Alcohol use: Yes    Alcohol/week: 6.0 standard drinks    Types: 6 Standard drinks or equivalent per week    Comment: wine/liquor 2 x a month   Drug use: No   Sexual activity: Yes    Birth control/protection: Surgical    Comment: Hysterectomy  Other Topics  Concern   Not on file  Social History Narrative   Occupation: bartender   Divorced  -- currently single   Alcohol use-yes -x 5 per weekend   No longer a smoker   Social Determinants of Radio broadcast assistant Strain: Not on file  Food Insecurity: Not on file  Transportation Needs: Not on file  Physical Activity: Not on file  Stress: Not on file  Social Connections: Not on file     Family History:  The patient's family history includes Aneurysm in her maternal grandmother; Breast cancer in her cousin, maternal aunt, and mother; COPD in her mother; Colon cancer in her maternal grandfather; Colon polyps in her brother, maternal grandfather, and mother; Coronary artery disease in her mother; Hypertension in her father; Kidney cancer in her paternal aunt; Kidney disease in her brother; Migraines in her mother; Other in her brother and mother; Rheum arthritis in her mother; Stroke in her mother; Uterine cancer in her cousin.   ROS:   Please see the history of present illness.    ROS All other systems reviewed and are negative.  No flowsheet data found.     PHYSICAL EXAM:   VS:  BP 120/62   Pulse 84   Ht 5\' 4"  (1.626 m)   Wt 238 lb (108 kg)   LMP  (LMP Unknown)   SpO2 98%   BMI 40.85 kg/m    GEN: Well nourished, well developed, in no acute distress  HEENT: normal  Neck: no JVD, carotid bruits, or masses Cardiac: RRR; no murmurs, rubs, or gallops,no edema.  Intact distal pulses bilaterally.  Respiratory:  clear to auscultation bilaterally, normal work of breathing GI: soft, nontender, nondistended, + BS MS: no deformity or atrophy  Skin: warm and dry, no rash Neuro:  Alert and Oriented x 3, Strength and sensation are intact Psych: euthymic mood, full affect  Wt Readings from Last 3 Encounters:  01/19/21 238 lb (108 kg)  01/05/21 235 lb 8 oz (106.8 kg)  12/02/20 240 lb 3.2 oz (109 kg)      Studies/Labs Reviewed:   EKG:  EKG is ordered today.  The ekg ordered today  demonstrates NSR with no ST changes  Recent Labs: 12/02/2020: ALT 18; BUN 9; Creatinine, Ser 0.80; Hemoglobin 13.7; Platelets 187.0; Potassium 3.8; Sodium 138; TSH 1.68   Lipid Panel    Component Value Date/Time   CHOL 187 12/02/2020 1121   CHOL 246 (H) 08/20/2014 0809   TRIG 112.0 12/02/2020 1121   TRIG 156 (H) 08/20/2014 0809   HDL 58.60 12/02/2020 1121   HDL 63 08/20/2014 0809   CHOLHDL 3 12/02/2020 1121   VLDL 22.4 12/02/2020 1121   LDLCALC 106 (H) 12/02/2020 1121   LDLCALC 146 (H) 11/28/2019 1131   LDLCALC 152 (H) 08/20/2014 0809   LDLDIRECT 160.5 08/30/2012 1434   Additional studies/ records that were reviewed today include:  OV notes from PCP  ASSESSMENT:    1. Leg edema   2. Chest pain of uncertain etiology   3. DOE (dyspnea on exertion)      PLAN:  In order of problems listed above:  LE edema -likely multifactorial related to dietary indiscretion with Na and obesity -check 2D echo to assess LVF -recommend DASH diet with < 2gm Na daily  2.  Chest pain -this is atypical in that it is nonexertional with no radiation but she breaks out in a sweat with it and has SOB -her CRFs include obesity, current tobacco use and family hx of CAD (Mother had MI and Brother has had several MIs at age 76 with first MI). -I will get a coronary CTA to define coronary anatomy  3.  DOE -likely related to obesity -check 2D Echo to assess LVF  Time Spent: 20 minutes total  time of encounter, including 15 minutes spent in face-to-face patient care on the date of this encounter. This time includes coordination of care and counseling regarding above mentioned problem list. Remainder of non-face-to-face time involved reviewing chart documents/testing relevant to the patient encounter and documentation in the medical record. I have independently reviewed documentation from referring provider  Medication Adjustments/Labs and Tests Ordered: Current medicines are reviewed at length with  the patient today.  Concerns regarding medicines are outlined above.  Medication changes, Labs and Tests ordered today are listed in the Patient Instructions below.  There are no Patient Instructions on file for this visit.   Signed, Fransico Him, MD  01/19/2021 10:41 AM    [facility] [place], [place], [place]  [address] Phone: [phone]; Fax: [phone]

## 2021-01-19 NOTE — Patient Instructions (Addendum)
Medication Instructions:  Your physician recommends that you continue on your current medications as directed. Please refer to the Current Medication list given to you today.  *If you need a refill on your cardiac medications before your next appointment, please call your pharmacy*  Testing/Procedures: Your physician has requested that you have an echocardiogram. Echocardiography is a painless test that uses sound waves to create images of your heart. It provides your doctor with information about the size and shape of your heart and how well your heart's chambers and valves are working. This procedure takes approximately one hour. There are no restrictions for this procedure.  Your physician has requested that you have a coronary CTA scan. Please see below for further instructions.   Follow-Up: At [facility], you and your health needs are our priority.  As part of our continuing mission to provide you with exceptional heart care, we have created designated Provider Care Teams.  These Care Teams include your primary Cardiologist (physician) and Advanced Practice Providers (APPs -  Physician Assistants and Nurse Practitioners) who all work together to provide you with the care you need, when you need it.  Your next appointment:   3 month(s)  The format for your next appointment:   In Person  Provider:   Fransico Him, MD     Other Instructions   Your cardiac CT will be scheduled at:   [facility] [address] [place], [place] [address] [phone]   Please arrive at the [facility] main entrance (entrance A) of [facility] 30 minutes prior to test start time. You can use the FREE valet parking offered at the main entrance (encouraged to control the heart rate for the test) Proceed to the [facility] Radiology Department (first floor) to check-in and test prep.  Please follow these instructions carefully (unless otherwise directed):  On the Night  Before the Test: Be sure to Drink plenty of water. Do not consume any caffeinated/decaffeinated beverages or chocolate 12 hours prior to your test. Do not take any antihistamines 12 hours prior to your test.  On the Day of the Test: Drink plenty of water until 1 hour prior to the test. Do not eat any food 4 hours prior to the test. You may take your regular medications prior to the test.  Take metoprolol (Lopressor) two hours prior to test. HOLD Furosemide morning of the test. FEMALES- please wear underwire-free bra if available, avoid dresses & tight clothing  After the Test: Drink plenty of water. After receiving IV contrast, you may experience a mild flushed feeling. This is normal. On occasion, you may experience a mild rash up to 24 hours after the test. This is not dangerous. If this occurs, you can take Benadryl 25 mg and increase your fluid intake. If you experience trouble breathing, this can be serious. If it is severe call 911 IMMEDIATELY. If it is mild, please call our office. If you take any of these medications: Glipizide/Metformin, Avandament, Glucavance, please do not take 48 hours after completing test unless otherwise instructed.  Please allow 2-4 weeks for scheduling of routine cardiac CTs. Some insurance companies require a pre-authorization which may delay scheduling of this test.   For non-scheduling related questions, please contact the cardiac imaging nurse navigator should you have any questions/concerns: Marchia Bond, Cardiac Imaging Nurse Navigator Gordy Clement, Cardiac Imaging Nurse Navigator [facility] Direct Office Dial: [phone]   For scheduling needs, including cancellations and rescheduling, please  call [place], [phone].    DASH Eating Plan DASH stands for Dietary Approaches to Stop Hypertension. The DASH eating plan is a healthy eating plan that has been shown to: Reduce high blood pressure  (hypertension). Reduce your risk for type 2 diabetes, heart disease, and stroke. Help with weight loss. What are tips for following this plan? Reading food labels Check food labels for the amount of salt (sodium) per serving. Choose foods with less than 5 percent of the Daily Value of sodium. Generally, foods with less than 300 milligrams (mg) of sodium per serving fit into this eating plan. To find whole grains, look for the word "whole" as the first word in the ingredient list. Shopping Buy products labeled as "low-sodium" or "no salt added." Buy fresh foods. Avoid canned foods and pre-made or frozen meals. Cooking Avoid adding salt when cooking. Use salt-free seasonings or herbs instead of table salt or sea salt. Check with your health care provider or pharmacist before using salt substitutes. Do not fry foods. Cook foods using healthy methods such as baking, boiling, grilling, roasting, and broiling instead. Cook with heart-healthy oils, such as olive, canola, avocado, soybean, or sunflower oil. Meal planning  Eat a balanced diet that includes: 4 or more servings of fruits and 4 or more servings of vegetables each day. Try to fill one-half of your plate with fruits and vegetables. 6-8 servings of whole grains each day. Less than 6 oz (170 g) of lean meat, poultry, or fish each day. A 3-oz (85-g) serving of meat is about the same size as a deck of cards. One egg equals 1 oz (28 g). 2-3 servings of low-fat dairy each day. One serving is 1 cup (237 mL). 1 serving of nuts, seeds, or beans 5 times each week. 2-3 servings of heart-healthy fats. Healthy fats called omega-3 fatty acids are found in foods such as walnuts, flaxseeds, fortified milks, and eggs. These fats are also found in cold-water fish, such as sardines, salmon, and mackerel. Limit how much you eat of: Canned or prepackaged foods. Food that is high in trans fat, such as some fried foods. Food that is high in saturated fat, such  as fatty meat. Desserts and other sweets, sugary drinks, and other foods with added sugar. Full-fat dairy products. Do not salt foods before eating. Do not eat more than 4 egg yolks a week. Try to eat at least 2 vegetarian meals a week. Eat more home-cooked food and less restaurant, buffet, and fast food. Lifestyle When eating at a restaurant, ask that your food be prepared with less salt or no salt, if possible. If you drink alcohol: Limit how much you use to: 0-1 drink a day for women who are not pregnant. 0-2 drinks a day for men. Be aware of how much alcohol is in your drink. In the [place] one drink equals one 12 oz bottle of beer (355 mL), one 5 oz glass of wine (148 mL), or one 1 oz glass of hard liquor (44 mL). General information Avoid eating more than 2,300 mg of salt a day. If you have hypertension, you may need to reduce your sodium intake to 1,500 mg a day. Work with your health care provider to maintain a healthy body weight or to lose weight. Ask what an ideal weight is for you. Get at least 30 minutes of exercise that causes your heart to beat faster (aerobic exercise) most days of the week. Activities may include walking, swimming, or biking. Work with  your health care provider or dietitian to adjust your eating plan to your individual calorie needs. What foods should I eat? Fruits All fresh, dried, or frozen fruit. Canned fruit in natural juice (without added sugar). Vegetables Fresh or frozen vegetables (raw, steamed, roasted, or grilled). Low-sodium or reduced-sodium tomato and vegetable juice. Low-sodium or reduced-sodium tomato sauce and tomato paste. Low-sodium or reduced-sodium canned vegetables. Grains Whole-grain or whole-wheat bread. Whole-grain or whole-wheat pasta. Brown rice. [place]. Bulgur. Whole-grain and low-sodium cereals. Pita bread. Low-fat, low-sodium crackers. Whole-wheat flour tortillas. Meats and other proteins Skinless chicken or [place].  Ground chicken or [place]. Pork with fat trimmed off. Fish and seafood. Egg whites. Dried beans, peas, or lentils. Unsalted nuts, nut butters, and seeds. Unsalted canned beans. Lean cuts of beef with fat trimmed off. Low-sodium, lean precooked or cured meat, such as sausages or meat loaves. Dairy Low-fat (1%) or fat-free (skim) milk. Reduced-fat, low-fat, or fat-free cheeses. Nonfat, low-sodium ricotta or cottage cheese. Low-fat or nonfat yogurt. Low-fat, low-sodium cheese. Fats and oils Soft margarine without trans fats. Vegetable oil. Reduced-fat, low-fat, or light mayonnaise and salad dressings (reduced-sodium). Canola, safflower, olive, avocado, soybean, and sunflower oils. Avocado. Seasonings and condiments Herbs. Spices. Seasoning mixes without salt. Other foods Unsalted popcorn and pretzels. Fat-free sweets. The items listed above may not be a complete list of foods and beverages you can eat. Contact a dietitian for more information. What foods should I avoid? Fruits Canned fruit in a light or heavy syrup. Fried fruit. Fruit in cream or butter sauce. Vegetables Creamed or fried vegetables. Vegetables in a cheese sauce. Regular canned vegetables (not low-sodium or reduced-sodium). Regular canned tomato sauce and paste (not low-sodium or reduced-sodium). Regular tomato and vegetable juice (not low-sodium or reduced-sodium). Angie Fava. Olives. Grains Baked goods made with fat, such as croissants, muffins, or some breads. Dry pasta or rice meal packs. Meats and other proteins Fatty cuts of meat. Ribs. Fried meat. Berniece Salines. Bologna, salami, and other precooked or cured meats, such as sausages or meat loaves. Fat from the back of a pig (fatback). Bratwurst. Salted nuts and seeds. Canned beans with added salt. Canned or smoked fish. Whole eggs or egg yolks. Chicken or [place] with skin. Dairy Whole or 2% milk, cream, and half-and-half. Whole or full-fat cream cheese. Whole-fat or sweetened yogurt.  Full-fat cheese. Nondairy creamers. Whipped toppings. Processed cheese and cheese spreads. Fats and oils Butter. Stick margarine. Lard. Shortening. Ghee. Bacon fat. Tropical oils, such as coconut, palm kernel, or palm oil. Seasonings and condiments Onion salt, garlic salt, seasoned salt, table salt, and sea salt. Worcestershire sauce. Tartar sauce. Barbecue sauce. Teriyaki sauce. Soy sauce, including reduced-sodium. Steak sauce. Canned and packaged gravies. Fish sauce. Oyster sauce. Cocktail sauce. Store-bought horseradish. Ketchup. Mustard. Meat flavorings and tenderizers. Bouillon cubes. Hot sauces. Pre-made or packaged marinades. Pre-made or packaged taco seasonings. Relishes. Regular salad dressings. Other foods Salted popcorn and pretzels. The items listed above may not be a complete list of foods and beverages you should avoid. Contact a dietitian for more information. Where to find more information National Heart, Lung, and Blood Institute: https://wilson-eaton.com/ American Heart Association: www.heart.org Academy of Nutrition and Dietetics: www.eatright.[place]: www.kidney.org Summary The DASH eating plan is a healthy eating plan that has been shown to reduce high blood pressure (hypertension). It may also reduce your risk for type 2 diabetes, heart disease, and stroke. When on the DASH eating plan, aim to eat more fresh fruits and vegetables, whole grains, lean proteins, low-fat  dairy, and heart-healthy fats. With the DASH eating plan, you should limit salt (sodium) intake to 2,300 mg a day. If you have hypertension, you may need to reduce your sodium intake to 1,500 mg a day. Work with your health care provider or dietitian to adjust your eating plan to your individual calorie needs. This information is not intended to replace advice given to you by your health care provider. Make sure you discuss any questions you have with your health care provider. Document Revised:  01/12/2019 Document Reviewed: 01/12/2019 Elsevier Patient Education  2022 [employer].

## 2021-01-21 ENCOUNTER — Ambulatory Visit: Payer: Medicare Other

## 2021-01-25 ENCOUNTER — Other Ambulatory Visit: Payer: Self-pay | Admitting: Neurology

## 2021-01-30 ENCOUNTER — Telehealth (HOSPITAL_COMMUNITY): Payer: Self-pay | Admitting: *Deleted

## 2021-01-30 NOTE — Telephone Encounter (Signed)
Attempted to call patient regarding upcoming cardiac CT appointment. °Left message on voicemail with name and callback number ° °Rafferty Postlewait RN Navigator Cardiac Imaging °[facility] °[phone] Office °[phone] Cell ° °

## 2021-02-02 ENCOUNTER — Telehealth (HOSPITAL_COMMUNITY): Payer: Self-pay | Admitting: *Deleted

## 2021-02-02 NOTE — Telephone Encounter (Signed)
Reaching out to patient to offer assistance regarding upcoming cardiac imaging study; pt verbalizes understanding of appt date/time, parking situation and where to check in, pre-test NPO status and medications ordered, and verified current allergies; name and call back number provided for further questions should they arise ° °Afiya Ferrebee RN Navigator Cardiac Imaging °[facility] °[phone] office °[phone] cell ° °Patient to take 100mg metoprolol tartrate two hours prior to cardiac CT scan. She is aware to arrive at 2:45pm for her 3:15pm scan. °

## 2021-02-03 ENCOUNTER — Other Ambulatory Visit: Payer: Self-pay

## 2021-02-03 ENCOUNTER — Encounter (HOSPITAL_COMMUNITY): Payer: Self-pay

## 2021-02-03 ENCOUNTER — Ambulatory Visit (HOSPITAL_COMMUNITY)
Admission: RE | Admit: 2021-02-03 | Discharge: 2021-02-03 | Disposition: A | Discharge status: Home / Self Care | Payer: Medicare Other | Source: Ambulatory Visit | Attending: Cardiology | Admitting: Cardiology

## 2021-02-03 DIAGNOSIS — I2510 Atherosclerotic heart disease of native coronary artery without angina pectoris: Secondary | ICD-10-CM | POA: Diagnosis not present

## 2021-02-03 DIAGNOSIS — R600 Localized edema: Secondary | ICD-10-CM | POA: Diagnosis not present

## 2021-02-03 DIAGNOSIS — R079 Chest pain, unspecified: Secondary | ICD-10-CM | POA: Diagnosis not present

## 2021-02-03 DIAGNOSIS — R0609 Other forms of dyspnea: Secondary | ICD-10-CM | POA: Diagnosis present

## 2021-02-03 HISTORY — DX: Atherosclerotic heart disease of native coronary artery without angina pectoris: I25.10

## 2021-02-03 MED ORDER — NITROGLYCERIN 0.4 MG SL SUBL
0.8000 mg | SUBLINGUAL_TABLET | Freq: Once | SUBLINGUAL | Status: AC
  Administered 2021-02-03: 0.8 mg via SUBLINGUAL

## 2021-02-03 MED ORDER — NITROGLYCERIN 0.4 MG SL SUBL
SUBLINGUAL_TABLET | SUBLINGUAL | Status: AC
  Filled 2021-02-03: qty 2

## 2021-02-03 MED ORDER — IOHEXOL 350 MG/ML SOLN
100.0000 mL | Freq: Once | INTRAVENOUS | Status: AC | PRN
  Administered 2021-02-03: 100 mL via INTRAVENOUS

## 2021-02-06 ENCOUNTER — Other Ambulatory Visit: Payer: Self-pay

## 2021-02-06 ENCOUNTER — Ambulatory Visit (HOSPITAL_COMMUNITY): Payer: Medicare Other | Attending: Cardiovascular Disease

## 2021-02-06 DIAGNOSIS — R079 Chest pain, unspecified: Secondary | ICD-10-CM | POA: Insufficient documentation

## 2021-02-06 DIAGNOSIS — R0609 Other forms of dyspnea: Secondary | ICD-10-CM | POA: Diagnosis not present

## 2021-02-06 DIAGNOSIS — R600 Localized edema: Secondary | ICD-10-CM | POA: Diagnosis not present

## 2021-02-06 DIAGNOSIS — I5189 Other ill-defined heart diseases: Secondary | ICD-10-CM

## 2021-02-06 HISTORY — DX: Other ill-defined heart diseases: I51.89

## 2021-02-06 LAB — ECHOCARDIOGRAM COMPLETE
Area-P 1/2: 4.04 cm2
S' Lateral: 2.8 cm

## 2021-02-06 MED ORDER — PERFLUTREN LIPID MICROSPHERE
1.0000 mL | INTRAVENOUS | Status: AC | PRN
Start: 2021-02-06 — End: 2021-02-06
  Administered 2021-02-06: 2 mL via INTRAVENOUS

## 2021-02-09 ENCOUNTER — Other Ambulatory Visit (HOSPITAL_COMMUNITY): Payer: Self-pay | Admitting: Psychiatry

## 2021-02-09 DIAGNOSIS — F4310 Post-traumatic stress disorder, unspecified: Secondary | ICD-10-CM

## 2021-02-18 ENCOUNTER — Ambulatory Visit: Payer: Medicare Other

## 2021-02-26 ENCOUNTER — Encounter: Payer: Self-pay | Admitting: Physician Assistant

## 2021-03-02 ENCOUNTER — Other Ambulatory Visit: Payer: Self-pay | Admitting: Family Medicine

## 2021-03-18 ENCOUNTER — Ambulatory Visit: Payer: Medicare Other

## 2021-03-19 ENCOUNTER — Telehealth: Payer: Self-pay | Admitting: Gastroenterology

## 2021-03-19 NOTE — Telephone Encounter (Signed)
Dr Rush Landmark you did a colon EMR on this pt last March and stated that she needs a repeat colon screening in 9 months.  She is calling to schedule.  Should this be with you in the hospital or Dr Silverio Decamp in the [facility]?

## 2021-03-19 NOTE — Telephone Encounter (Signed)
Inbound call from patient requesting to schedule procedure at the hospital please.

## 2021-03-19 NOTE — Telephone Encounter (Signed)
Schedule with me.  Colonoscopy EMR 75-minute slot.  Hospital-based.  Thanks. GM

## 2021-03-20 ENCOUNTER — Other Ambulatory Visit: Payer: Self-pay

## 2021-03-20 DIAGNOSIS — K635 Polyp of colon: Secondary | ICD-10-CM

## 2021-03-20 MED ORDER — PEG 3350-KCL-NA BICARB-NACL 420 G PO SOLR: 4000.0000 mL | Freq: Once | ORAL | 0 refills | Status: AC

## 2021-03-20 NOTE — Telephone Encounter (Signed)
Colon scheduled, pt instructed and medications reviewed.  Patient instructions mailed to home and sent to My Chart.  Patient to call with any questions or concerns.   

## 2021-03-20 NOTE — Telephone Encounter (Signed)
Colon EMR scheduled for 05/07/21 at 845 am at [facility] with GM

## 2021-03-26 ENCOUNTER — Encounter: Payer: Self-pay | Admitting: Gastroenterology

## 2021-04-06 ENCOUNTER — Other Ambulatory Visit (HOSPITAL_COMMUNITY): Payer: Self-pay | Admitting: Psychiatry

## 2021-04-06 DIAGNOSIS — F4310 Post-traumatic stress disorder, unspecified: Secondary | ICD-10-CM

## 2021-04-08 ENCOUNTER — Other Ambulatory Visit: Payer: Self-pay

## 2021-04-08 ENCOUNTER — Telehealth (HOSPITAL_BASED_OUTPATIENT_CLINIC_OR_DEPARTMENT_OTHER): Payer: Medicare Other | Admitting: Psychiatry

## 2021-04-08 ENCOUNTER — Encounter (HOSPITAL_COMMUNITY): Payer: Self-pay | Admitting: Psychiatry

## 2021-04-08 DIAGNOSIS — F319 Bipolar disorder, unspecified: Secondary | ICD-10-CM

## 2021-04-08 DIAGNOSIS — F4310 Post-traumatic stress disorder, unspecified: Secondary | ICD-10-CM

## 2021-04-08 MED ORDER — TRAZODONE HCL 150 MG PO TABS: 150.0000 mg | ORAL_TABLET | Freq: Every evening | ORAL | 0 refills | Status: DC | PRN

## 2021-04-08 MED ORDER — DULOXETINE HCL 60 MG PO CPEP: 60.0000 mg | ORAL_CAPSULE | Freq: Every day | ORAL | 0 refills | Status: DC

## 2021-04-08 MED ORDER — LAMOTRIGINE 200 MG PO TABS: 200.0000 mg | ORAL_TABLET | Freq: Every day | ORAL | 0 refills | Status: DC

## 2021-04-08 MED ORDER — ARIPIPRAZOLE 5 MG PO TABS: 5.0000 mg | ORAL_TABLET | Freq: Every day | ORAL | 0 refills | Status: DC
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
## 2021-04-26 ENCOUNTER — Encounter: Payer: Self-pay | Admitting: Physician Assistant

## 2021-04-29 ENCOUNTER — Encounter (HOSPITAL_COMMUNITY): Payer: Self-pay | Admitting: Gastroenterology

## 2021-05-07 ENCOUNTER — Other Ambulatory Visit: Payer: Self-pay

## 2021-05-07 ENCOUNTER — Ambulatory Visit (HOSPITAL_BASED_OUTPATIENT_CLINIC_OR_DEPARTMENT_OTHER): Payer: Medicare Other | Admitting: Anesthesiology

## 2021-05-07 ENCOUNTER — Encounter (HOSPITAL_COMMUNITY): Payer: Self-pay | Admitting: Gastroenterology

## 2021-05-07 ENCOUNTER — Encounter (HOSPITAL_COMMUNITY)
Admission: RE | Disposition: A | Discharge status: Home / Self Care | Payer: Self-pay | Source: Home / Self Care | Attending: Gastroenterology

## 2021-05-07 ENCOUNTER — Ambulatory Visit (HOSPITAL_COMMUNITY)
Admission: RE | Admit: 2021-05-07 | Discharge: 2021-05-07 | Disposition: A | Discharge status: Home / Self Care | Payer: Medicare Other | Attending: Gastroenterology | Admitting: Gastroenterology

## 2021-05-07 ENCOUNTER — Ambulatory Visit (HOSPITAL_COMMUNITY): Payer: Medicare Other | Admitting: Anesthesiology

## 2021-05-07 DIAGNOSIS — K6289 Other specified diseases of anus and rectum: Secondary | ICD-10-CM | POA: Diagnosis not present

## 2021-05-07 DIAGNOSIS — K635 Polyp of colon: Secondary | ICD-10-CM | POA: Diagnosis not present

## 2021-05-07 DIAGNOSIS — K621 Rectal polyp: Secondary | ICD-10-CM

## 2021-05-07 DIAGNOSIS — K648 Other hemorrhoids: Secondary | ICD-10-CM

## 2021-05-07 DIAGNOSIS — Z09 Encounter for follow-up examination after completed treatment for conditions other than malignant neoplasm: Secondary | ICD-10-CM | POA: Insufficient documentation

## 2021-05-07 DIAGNOSIS — Z86010 Personal history of colonic polyps: Secondary | ICD-10-CM

## 2021-05-07 DIAGNOSIS — K641 Second degree hemorrhoids: Secondary | ICD-10-CM | POA: Diagnosis not present

## 2021-05-07 DIAGNOSIS — K644 Residual hemorrhoidal skin tags: Secondary | ICD-10-CM | POA: Diagnosis not present

## 2021-05-07 DIAGNOSIS — D120 Benign neoplasm of cecum: Secondary | ICD-10-CM | POA: Insufficient documentation

## 2021-05-07 DIAGNOSIS — D124 Benign neoplasm of descending colon: Secondary | ICD-10-CM | POA: Diagnosis not present

## 2021-05-07 HISTORY — PX: SUBMUCOSAL LIFTING INJECTION: SHX6855

## 2021-05-07 HISTORY — PX: ENDOSCOPIC MUCOSAL RESECTION: SHX6839

## 2021-05-07 HISTORY — PX: BIOPSY: SHX5522

## 2021-05-07 HISTORY — PX: HOT HEMOSTASIS: SHX5433

## 2021-05-07 HISTORY — PX: POLYPECTOMY: SHX5525

## 2021-05-07 HISTORY — PX: HEMOSTASIS CLIP PLACEMENT: SHX6857

## 2021-05-07 HISTORY — PX: COLONOSCOPY WITH PROPOFOL: SHX5780

## 2021-05-07 SURGERY — COLONOSCOPY WITH PROPOFOL
Anesthesia: Monitor Anesthesia Care

## 2021-05-07 MED ORDER — PHENYLEPHRINE 40 MCG/ML (10ML) SYRINGE FOR IV PUSH (FOR BLOOD PRESSURE SUPPORT)
PREFILLED_SYRINGE | INTRAVENOUS | Status: DC | PRN
  Administered 2021-05-07 (×2): 80 ug via INTRAVENOUS
  Administered 2021-05-07: 40 ug via INTRAVENOUS
  Administered 2021-05-07 (×2): 80 ug via INTRAVENOUS
  Administered 2021-05-07: 40 ug via INTRAVENOUS

## 2021-05-07 MED ORDER — SODIUM CHLORIDE 0.9 % IV SOLN: INTRAVENOUS | Status: DC

## 2021-05-07 MED ORDER — PROPOFOL 500 MG/50ML IV EMUL
INTRAVENOUS | Status: DC | PRN
  Administered 2021-05-07: 150 ug/kg/min via INTRAVENOUS

## 2021-05-07 MED ORDER — LACTATED RINGERS IV SOLN: INTRAVENOUS | Status: DC

## 2021-05-07 MED ORDER — LIDOCAINE 2% (20 MG/ML) 5 ML SYRINGE
INTRAMUSCULAR | Status: DC | PRN
Start: 2021-05-07 — End: 2021-05-07
  Administered 2021-05-07: 100 mg via INTRAVENOUS

## 2021-05-07 SURGICAL SUPPLY — 22 items

## 2021-05-07 NOTE — Anesthesia Preprocedure Evaluation (Addendum)
Anesthesia Evaluation  ?Patient identified by MRN, date of birth, ID band ?Patient awake ? ? ? ?Reviewed: ?Allergy & Precautions, NPO status , Patient's Chart, lab work & pertinent test results ? ?History of Anesthesia Complications ?Negative for: history of anesthetic complications ? ?Airway ?Mallampati: I ? ?TM Distance: >3 FB ?Neck ROM: Full ? ? ? Dental ? ?(+) Edentulous Upper, Upper Dentures, Missing, Poor Dentition, Dental Advisory Given ?  ?Pulmonary ?sleep apnea (does not use CPAP) , Current SmokerPatient did not abstain from smoking.,  ?  ?breath sounds clear to auscultation ? ? ? ? ? ? Cardiovascular ?hypertension, Pt. on medications ?(-) angina ?Rhythm:Regular Rate:Normal ? ?01/2021 ECHO: EF 55-60%, normal LVF, mild LVH, Grade 1 DD, normal RVF, no significant valvular abnormalities ?  ?Neuro/Psych ? Headaches, Anxiety Depression Bipolar Disorder   ? GI/Hepatic ?Neg liver ROS, GERD  Medicated and Controlled,  ?Endo/Other  ?Morbid obesity ? Renal/GU ?negative Renal ROS  ? ?  ?Musculoskeletal ? ?(+) Arthritis ,  ? Abdominal ?(+) + obese,   ?Peds ? Hematology ?negative hematology ROS ?(+)   ?Anesthesia Other Findings ? ? Reproductive/Obstetrics ? ?  ? ? ? ? ? ? ? ? ? ? ? ? ? ?  ?  ? ? ? ? ? ? ? ?Anesthesia Physical ?Anesthesia Plan ? ?ASA: 3 ? ?Anesthesia Plan: MAC  ? ?Post-op Pain Management: Minimal or no pain anticipated  ? ?Induction:  ? ?PONV Risk Score and Plan: 1 and Treatment may vary due to age or medical condition and Ondansetron ? ?Airway Management Planned: Natural Airway and Simple Face Mask ? ?Additional Equipment: None ? ?Intra-op Plan:  ? ?Post-operative Plan:  ? ?Informed Consent: I have reviewed the patients History and Physical, chart, labs and discussed the procedure including the risks, benefits and alternatives for the proposed anesthesia with the patient or authorized representative who has indicated his/her understanding and acceptance.  ? ? ? ?Dental  advisory given ? ?Plan Discussed with: CRNA and Surgeon ? ?Anesthesia Plan Comments:   ? ? ? ? ? ?Anesthesia Quick Evaluation ? ?

## 2021-05-07 NOTE — H&P (Signed)
? ?GASTROENTEROLOGY PROCEDURE H&P NOTE  ? ?Primary Care Physician: ?Inda Coke, PA ? ?HPI: ?[name] White is a 56 y.o. female who presents for Colonoscopy for follow up of large Cecal/AO EMR in 2022. ? ?Past Medical History:  ?Diagnosis Date  ? Anterolisthesis   ? Anxiety   ? per pt, has panic attacks  ? Arthritis   ? in back per pt.  ? Arthritis of right hip   ? hips  ? Barrett's esophagus   ? egd 2007 + 2009 but none 2011 or 08/2010 egd  ? Bipolar disorder ([place])   ? Chronic headache   ? Colon polyp   ? hyperplastic  ? Depression   ? hx of suicide attempt in 2003  ? GERD (gastroesophageal reflux disease)   ? Grief reaction   ? Hidradenitis suppurativa   ? IBS (irritable bowel syndrome)   ? Migraine   ? OSA (obstructive sleep apnea)   ? does not use cpap  ? Ovarian cyst   ? Restless leg   ? Tobacco abuse   ? ?Past Surgical History:  ?Procedure Laterality Date  ? ABDOMINAL HYSTERECTOMY    ? secondary to endometriosis  ? BACK SURGERY Right 2019  ? lumbar ablation L3-S1 x 2   ? CESAREAN SECTION    ? x 2  ? CHOLECYSTECTOMY    ? COLONOSCOPY W/ POLYPECTOMY  2012  ? COLONOSCOPY WITH PROPOFOL N/A 05/01/2020  ? Procedure: COLONOSCOPY WITH PROPOFOL;  Surgeon: Mansouraty, Telford Nab., MD;  Location: [facility];  Service: Gastroenterology;  Laterality: N/A;  ? ELBOW SURGERY    ? left  ? ENDOSCOPIC MUCOSAL RESECTION N/A 05/01/2020  ? Procedure: ENDOSCOPIC MUCOSAL RESECTION;  Surgeon: Rush Landmark Telford Nab., MD;  Location: [facility];  Service: Gastroenterology;  Laterality: N/A;  ? HEMOSTASIS CLIP PLACEMENT  05/01/2020  ? Procedure: HEMOSTASIS CLIP PLACEMENT;  Surgeon: Irving Copas., MD;  Location: [place];  Service: Gastroenterology;;  ? LAPAROSCOPY  04-22-10  ? lysis of adhesions  ? LAPAROSCOPY  04-30-10   ? with mini laparotomy and cystotomy repair  ? plantar fascitis    ? right foot  ? POLYPECTOMY  05/01/2020  ? Procedure: POLYPECTOMY;  Surgeon: Rush Landmark Telford Nab., MD;  Location: [facility];   Service: Gastroenterology;;  ? SALPINGECTOMY  04-22-10  ? right  ? SUBMUCOSAL LIFTING INJECTION  05/01/2020  ? Procedure: SUBMUCOSAL LIFTING INJECTION;  Surgeon: Irving Copas., MD;  Location: [place];  Service: Gastroenterology;;  ? TOTAL HIP ARTHROPLASTY Right 04/25/2017  ? Procedure: RIGHT TOTAL HIP ARTHROPLASTY ANTERIOR APPROACH;  Surgeon: Rod Can, MD;  Location: [place];  Service: Orthopedics;  Laterality: Right;  Needs RNFA  ? TUBAL LIGATION    ? WRIST SURGERY    ? right/ganglion cyst  ? ?Current Facility-Administered Medications  ?Medication Dose Route Frequency Provider Last Rate Last Admin  ? 0.9 %  sodium chloride infusion   Intravenous Continuous Mansouraty, Telford Nab., MD      ? lactated ringers infusion   Intravenous Continuous Mansouraty, Telford Nab., MD      ? ? ?Current Facility-Administered Medications:  ?  0.9 %  sodium chloride infusion, , Intravenous, Continuous, Mansouraty, Telford Nab., MD ?  lactated ringers infusion, , Intravenous, Continuous, Mansouraty, Telford Nab., MD ?Allergies  ?Allergen Reactions  ? Morphine Hives and Nausea And Vomiting  ?  INTOLERANCE >  N & V  ? Penicillins Hives, Itching and Other (See Comments)  ?   PATIENT HAS HAD A PCN REACTION WITH IMMEDIATE  RASH, FACIAL/TONGUE/THROAT SWELLING, SOB, OR LIGHTHEADEDNESS WITH HYPOTENSION:  #  #  #  YES  #  #  #   ?Has patient had a PCN reaction causing severe rash involving mucus membranes or skin necrosis: Unknown ?Has patient had a PCN reaction that required hospitalization: No ?Has patient had a PCN reaction occurring within the last 10 years: No ?If all of the above answers are "NO", then may proceed with Cephalosporin use. ?  ? Clarithromycin Nausea And Vomiting  ? ?Family History  ?Problem Relation Age of Onset  ? COPD Mother   ? Stroke Mother   ? Coronary artery disease Mother   ?     pacer  ? Colon polyps Mother   ? Breast cancer Mother   ? Migraines Mother   ? Other Mother   ?     RSDS  ? Rheum arthritis  Mother   ? Hypertension Father   ? Colon polyps Brother   ? Colon cancer Maternal Grandfather   ? Colon polyps Maternal Grandfather   ? Breast cancer Maternal Aunt   ? Uterine cancer Cousin   ?     x 2  ? Aneurysm Maternal Grandmother   ? Kidney disease Brother   ? Other Brother   ?     back pain  ? Kidney cancer Paternal Aunt   ? Breast cancer Cousin   ? Stomach cancer Neg Hx   ? Pancreatic cancer Neg Hx   ? Esophageal cancer Neg Hx   ? Liver disease Neg Hx   ? Inflammatory bowel disease Neg Hx   ? ?Social History  ? ?Socioeconomic History  ? Marital status: Divorced  ?  Spouse name: Not on file  ? Number of children: 2  ? Years of education: Not on file  ? Highest education level: Not on file  ?Occupational History  ? Occupation: Chief Operating Officer  ?Tobacco Use  ? Smoking status: Every Day  ?  Packs/day: 1.00  ?  Years: 40.00  ?  Pack years: 40.00  ?  Types: Cigarettes  ? Smokeless tobacco: Never  ? Tobacco comments:  ?  quit 06/21/15 - started back smoking 03/2020  ?Vaping Use  ? Vaping Use: Never used  ?Substance and Sexual Activity  ? Alcohol use: Yes  ?  Alcohol/week: 6.0 standard drinks  ?  Types: 6 Standard drinks or equivalent per week  ?  Comment: wine/liquor 2 x a month  ? Drug use: No  ? Sexual activity: Yes  ?  Birth control/protection: Surgical  ?  Comment: Hysterectomy  ?Other Topics Concern  ? Not on file  ?Social History Narrative  ? Occupation: bartender  ? Divorced  -- currently single  ? Alcohol use-yes -x 5 per weekend  ? No longer a smoker  ? ?Social Determinants of Health  ? ?Financial Resource Strain: Not on file  ?Food Insecurity: Not on file  ?Transportation Needs: Not on file  ?Physical Activity: Not on file  ?Stress: Not on file  ?Social Connections: Not on file  ?Intimate Partner Violence: Not on file  ? ? ?Physical Exam: ?Today's Vitals  ? 05/07/21 0719  ?BP: (!) 119/58  ?Pulse: 74  ?Resp: 18  ?Temp: 97.8 ?F (36.6 ?C)  ?TempSrc: Temporal  ?SpO2: 93%  ?Weight: 106.6 kg  ?Height: '5\' 5"'$  (1.651 m)   ?PainSc: 0-No pain  ? ?Body mass index is 39.11 kg/m?. ?GEN: NAD ?EYE: Sclerae anicteric ?ENT: MMM ?CV: Non-tachycardic ?GI: Soft, NT/ND ?NEURO:  Alert &  Oriented x 3 ? ?Lab Results: ?No results for input(s): WBC, HGB, HCT, PLT in the last 72 hours. ?BMET ?No results for input(s): NA, K, CL, CO2, GLUCOSE, BUN, CREATININE, CALCIUM in the last 72 hours. ?LFT ?No results for input(s): PROT, ALBUMIN, AST, ALT, ALKPHOS, BILITOT, BILIDIR, IBILI in the last 72 hours. ?PT/INR ?No results for input(s): LABPROT, INR in the last 72 hours. ? ? ?Impression / Plan: ?This is a 56 y.o.female who presents for Colonoscopy for follow up of large Cecal/AO EMR in 2022. ? ?The risks and benefits of endoscopic evaluation/treatment were discussed with the patient and/or family; these include but are not limited to the risk of perforation, infection, bleeding, missed lesions, lack of diagnosis, severe illness requiring hospitalization, as well as anesthesia and sedation related illnesses.  The patient's history has been reviewed, patient examined, no change in status, and deemed stable for procedure.  The patient and/or family is agreeable to proceed.  ? ? ?Justice Britain, MD ?[facility] ?[facility]oscopy ?Office # [phone] ? ?

## 2021-05-07 NOTE — Transfer of Care (Signed)
Immediate Anesthesia Transfer of Care Note ? ?Patient: [name] White ? ?Procedure(s) Performed: COLONOSCOPY WITH PROPOFOL ?ENDOSCOPIC MUCOSAL RESECTION ?SUBMUCOSAL LIFTING INJECTION ?HOT HEMOSTASIS (ARGON PLASMA COAGULATION/BICAP) ?BIOPSY ?POLYPECTOMY ?HEMOSTASIS CLIP PLACEMENT ? ?Patient Location: PACU ? ?Anesthesia Type:General ? ?Level of Consciousness: awake ? ?Airway & Oxygen Therapy: Patient Spontanous Breathing and Patient connected to nasal cannula oxygen ? ?Post-op Assessment: Report given to RN and Post -op Vital signs reviewed and stable ? ?Post vital signs: Reviewed and stable ? ?Last Vitals:  ?Vitals Value Taken Time  ?BP 104/56 05/07/21 1004  ?Temp 36.6 ?C 05/07/21 1004  ?Pulse 70 05/07/21 1009  ?Resp 12 05/07/21 1009  ?SpO2 97 % 05/07/21 1009  ?Vitals shown include unvalidated device data. ? ?Last Pain:  ?Vitals:  ? 05/07/21 1004  ?TempSrc:   ?PainSc: 0-No pain  ?   ? ?  ? ?Complications: No notable events documented. ?

## 2021-05-07 NOTE — Op Note (Signed)
[facility] ?Patient Name: [name] White ?Procedure Date : 05/07/2021 ?MRN: [id number] ?Attending MD: Justice Britain , MD ?Date of Birth: 1966-01-31 ?CSN: [id number] ?Age: 56 ?Admit Type: Outpatient ?Procedure:                Colonoscopy ?Indications:              High risk colon cancer surveillance: Personal  ?                          history of sessile serrated colon polyp (10 mm or  ?                          greater in size) ?Providers:                Justice Britain, MD, Jeanella Cara, RN,  ?                          Frazier Richards, Technician ?Referring MD:             Mauri Pole, MD, Inda Coke ?Medicines:                Monitored Anesthesia Care ?Complications:            No immediate complications. ?Estimated Blood Loss:     Estimated blood loss was minimal. ?Procedure:                Pre-Anesthesia Assessment: ?                          - Prior to the procedure, a History and Physical  ?                          was performed, and patient medications and  ?                          allergies were reviewed. The patient's tolerance of  ?                          previous anesthesia was also reviewed. The risks  ?                          and benefits of the procedure and the sedation  ?                          options and risks were discussed with the patient.  ?                          All questions were answered, and informed consent  ?                          was obtained. Prior Anticoagulants: The patient has  ?                          taken no previous anticoagulant or antiplatelet  ?  agents. ASA Grade Assessment: II - A patient with  ?                          mild systemic disease. After reviewing the risks  ?                          and benefits, the patient was deemed in  ?                          satisfactory condition to undergo the procedure. ?                          After obtaining informed consent, the colonoscope  ?                           was passed under direct vision. Throughout the  ?                          procedure, the patient's blood pressure, pulse, and  ?                          oxygen saturations were monitored continuously. The  ?                          [id number] ([id number]) Olympus coloscope was  ?                          introduced through the anus and advanced to the 5  ?                          cm into the ileum. The colonoscopy was somewhat  ?                          difficult. Successful completion of the procedure  ?                          was aided by performing the maneuvers documented  ?                          (below) in this report. The patient tolerated the  ?                          procedure. The quality of the bowel preparation was  ?                          good. The terminal ileum, ileocecal valve,  ?                          appendiceal orifice, and rectum were photographed. ?Scope In: 9:03:20 AM ?Scope Out: 9:52:57 AM ?Scope Withdrawal Time: 0 hours 46 minutes 23 seconds  ?Total Procedure Duration: 0 hours 49 minutes 37 seconds  ?Findings: ?     The digital rectal exam findings include hemorrhoids. Pertinent  ?     negatives include  no palpable rectal lesions. ?     The terminal ileum and ileocecal valve appeared normal. ?     A medium post mucosectomy scar was found in the cecum. There was  ?     residual polypoid tissue concerning for recurrence within the scar site.  ?     The appendiceal orifice is clear. Preparations were made for attempt at  ?     repeat mucosal resection. NBI imaging and White-light endoscopy was done  ?     to demarcate the borders of the lesion. Everlift was injected to raise  ?     the lesion and this was partially successful. Piecemeal mucosal  ?     resection using a snare was performed. Resection was incomplete however  ?     due to tacked down/scarred fibrosis within the middle of this site. The  ?     resected tissue was retrieved. Fulguration to ablate the  lesion remnants  ?     by avulsion forceps was successful for the residual appearing tissue.  ?     Coagulation for tissue destruction using snare tip soft coagulation to  ?     the margin was successful. To prevent bleeding after interventions,  ?     three hemostatic clips were successfully placed (MR conditional). There  ?     was no bleeding during, or at the end, of the procedure. ?     Three sessile polyps were found in the rectum, recto-sigmoid colon and  ?     descending colon. The polyps were 2 to 5 mm in size. These polyps were  ?     removed with a cold snare. Resection and retrieval were complete. ?     Normal mucosa was found in the entire colon otherwise. ?     Non-bleeding non-thrombosed external and internal hemorrhoids were found  ?     during retroflexion, during perianal exam and during digital exam. The  ?     hemorrhoids were Grade II (internal hemorrhoids that prolapse but reduce  ?     spontaneously). ?Impression:               - Hemorrhoids found on digital rectal exam. ?                          - The examined portion of the ileum was normal. ?                          - Post mucosectomy scar in the cecum with evidence  ?                          of likely recurrence. Piecemeal EMR only partially  ?                          successful. Then forcep avulsion performed. Then  ?                          STSC to the margin performed. Clips (MR  ?                          conditional) were placed. ?                          -  Three 2 to 5 mm polyps in the rectum, at the  ?                          recto-sigmoid colon and in the descending colon,  ?                          removed with a cold snare. Resected and retrieved. ?                          - Normal mucosa in the entire examined colon  ?                          otherwise. ?                          - Non-bleeding non-thrombosed external and internal  ?                          hemorrhoids. ?Recommendation:           - The patient will be  observed post-procedure,  ?                          until all discharge criteria are met. ?                          - Discharge patient to home. ?                          - Patient has a contact number available for  ?                          emergencies. The signs and symptoms of potential  ?                          delayed complications were discussed with the  ?                          patient. Return to normal activities tomorrow.  ?                          Written discharge instructions were provided to the  ?                          patient. ?                          - High fiber diet. ?                          - Use FiberCon 1-2 tablets PO daily. ?                          - Continue present medications. ?                          -  Await pathology results. ?                          - Repeat colonoscopy in 1 year for surveillance. If  ?                          there is no evidence of recurrence then 3-year  ?                          follow up will be recommended. ?                          - The findings and recommendations were discussed  ?                          with the patient. ?Procedure Code(s):        --- Professional --- ?                          [phone], Colonoscopy, flexible; with endoscopic  ?                          mucosal resection ?                          [id number], 59, Colonoscopy, flexible; with removal of  ?                          tumor(s), polyp(s), or other lesion(s) by snare  ?                          technique ?Diagnosis Code(s):        --- Professional --- ?                          Z86.010, Personal history of colonic polyps ?                          K64.1, Second degree hemorrhoids ?                          Z98.890, Other specified postprocedural states ?                          K62.1, Rectal polyp ?                          K63.5, Polyp of colon ?CPT copyright 2019 [employer] Medical Association. All rights reserved. ?The codes documented in this report are  preliminary and upon coder review may  ?be revised to meet current compliance requirements. ?Justice Britain, MD ?05/07/2021 10:21:38 AM ?Number of Addenda: 0 ?

## 2021-05-07 NOTE — Anesthesia Postprocedure Evaluation (Signed)
Anesthesia Post Note ? ?Patient: [name] White ? ?Procedure(s) Performed: COLONOSCOPY WITH PROPOFOL ?ENDOSCOPIC MUCOSAL RESECTION ?SUBMUCOSAL LIFTING INJECTION ?HOT HEMOSTASIS (ARGON PLASMA COAGULATION/BICAP) ?BIOPSY ?POLYPECTOMY ?HEMOSTASIS CLIP PLACEMENT ? ?  ? ?Patient location during evaluation: PACU ?Anesthesia Type: MAC ?Level of consciousness: awake and alert, oriented and patient cooperative ?Pain management: pain level controlled ?Vital Signs Assessment: post-procedure vital signs reviewed and stable ?Respiratory status: spontaneous breathing, nonlabored ventilation and respiratory function stable ?Cardiovascular status: blood pressure returned to baseline and stable ?Postop Assessment: no apparent nausea or vomiting and able to ambulate ?Anesthetic complications: no ? ? ?No notable events documented. ? ?Last Vitals:  ?Vitals:  ? 05/07/21 1019 05/07/21 1034  ?BP: (!) 117/55 109/60  ?Pulse: 68 76  ?Resp: 17 18  ?Temp:  36.7 ?C  ?SpO2: 98% 96%  ?  ?Last Pain:  ?Vitals:  ? 05/07/21 1034  ?TempSrc:   ?PainSc: 0-No pain  ? ? ?  ?  ?  ?  ?  ?  ? ?Inioluwa Boulay,E. Sael Furches ? ? ? ? ?

## 2021-05-07 NOTE — Anesthesia Procedure Notes (Signed)
Procedure Name: Branch ?Date/Time: 05/07/2021 8:46 AM ?Performed by: Bryson Corona, CRNA ?Pre-anesthesia Checklist: Patient identified, Emergency Drugs available, Suction available and Patient being monitored ?Patient Re-evaluated:Patient Re-evaluated prior to induction ?Oxygen Delivery Method: Simple face mask ?Induction Type: IV induction ?Placement Confirmation: positive ETCO2 ?Dental Injury: Teeth and Oropharynx as per pre-operative assessment  ? ? ? ? ?

## 2021-05-07 NOTE — Progress Notes (Signed)
Orthopedic Tech Progress Note ?Patient Details:  ?[name] White ?1965-11-11 ?[id number] ? ?Called by RN Lorrin Goodell for a sling. Dropped off at PACU 3. ? ?Ortho Devices ?Type of Ortho Device: Sling immobilizer ?Ortho Device/Splint Location: handed to PACU RN at bedside, PACU 3 ?Ortho Device/Splint Interventions: Ordered ?  ?  ? ?Carin Primrose ?05/07/2021, 1:29 PM ? ?

## 2021-05-08 ENCOUNTER — Encounter (HOSPITAL_COMMUNITY): Payer: Self-pay | Admitting: Gastroenterology

## 2021-05-08 LAB — SURGICAL PATHOLOGY

## 2021-05-12 ENCOUNTER — Encounter: Payer: Self-pay | Admitting: Gastroenterology

## 2021-05-13 ENCOUNTER — Other Ambulatory Visit: Payer: Self-pay | Admitting: Physician Assistant

## 2021-06-04 ENCOUNTER — Other Ambulatory Visit: Payer: Self-pay | Admitting: Physician Assistant

## 2021-06-05 ENCOUNTER — Other Ambulatory Visit: Payer: Self-pay | Admitting: Family Medicine

## 2021-07-02 ENCOUNTER — Other Ambulatory Visit (HOSPITAL_COMMUNITY): Payer: Self-pay | Admitting: Psychiatry

## 2021-07-02 ENCOUNTER — Other Ambulatory Visit: Payer: Self-pay | Admitting: Physician Assistant

## 2021-07-02 DIAGNOSIS — F4310 Post-traumatic stress disorder, unspecified: Secondary | ICD-10-CM

## 2021-07-02 DIAGNOSIS — F319 Bipolar disorder, unspecified: Secondary | ICD-10-CM

## 2021-07-08 ENCOUNTER — Telehealth (HOSPITAL_COMMUNITY): Payer: Medicare Other | Admitting: Psychiatry

## 2021-07-16 ENCOUNTER — Other Ambulatory Visit (HOSPITAL_COMMUNITY): Payer: Self-pay | Admitting: *Deleted

## 2021-07-16 DIAGNOSIS — F319 Bipolar disorder, unspecified: Secondary | ICD-10-CM

## 2021-07-16 DIAGNOSIS — F4310 Post-traumatic stress disorder, unspecified: Secondary | ICD-10-CM

## 2021-07-16 MED ORDER — DULOXETINE HCL 60 MG PO CPEP: 60.0000 mg | ORAL_CAPSULE | Freq: Every day | ORAL | 0 refills | Status: DC

## 2021-07-16 MED ORDER — ARIPIPRAZOLE 5 MG PO TABS: 5.0000 mg | ORAL_TABLET | Freq: Every day | ORAL | 0 refills | Status: DC

## 2021-07-16 MED ORDER — LAMOTRIGINE 200 MG PO TABS: 200.0000 mg | ORAL_TABLET | Freq: Every day | ORAL | 0 refills | Status: DC

## 2021-07-16 MED ORDER — TRAZODONE HCL 50 MG PO TABS: 50.0000 mg | ORAL_TABLET | Freq: Every evening | ORAL | 0 refills | Status: DC | PRN

## 2021-07-18 ENCOUNTER — Other Ambulatory Visit: Payer: Self-pay | Admitting: Neurology

## 2021-08-05 ENCOUNTER — Telehealth (HOSPITAL_BASED_OUTPATIENT_CLINIC_OR_DEPARTMENT_OTHER): Payer: Medicare Other | Admitting: Psychiatry

## 2021-08-05 ENCOUNTER — Other Ambulatory Visit (HOSPITAL_COMMUNITY): Payer: Self-pay | Admitting: *Deleted

## 2021-08-05 ENCOUNTER — Encounter (HOSPITAL_COMMUNITY): Payer: Self-pay | Admitting: Psychiatry

## 2021-08-05 DIAGNOSIS — F319 Bipolar disorder, unspecified: Secondary | ICD-10-CM | POA: Diagnosis not present

## 2021-08-05 DIAGNOSIS — F4310 Post-traumatic stress disorder, unspecified: Secondary | ICD-10-CM | POA: Diagnosis not present

## 2021-08-05 MED ORDER — ARIPIPRAZOLE 10 MG PO TABS: 5.0000 mg | ORAL_TABLET | Freq: Every day | ORAL | 0 refills | Status: DC

## 2021-08-05 MED ORDER — ARIPIPRAZOLE 5 MG PO TABS: 5.0000 mg | ORAL_TABLET | Freq: Every day | ORAL | 0 refills | Status: DC

## 2021-08-05 MED ORDER — LAMOTRIGINE 200 MG PO TABS: 200.0000 mg | ORAL_TABLET | Freq: Every day | ORAL | 0 refills | Status: DC

## 2021-08-05 MED ORDER — TRAZODONE HCL 150 MG PO TABS: 150.0000 mg | ORAL_TABLET | Freq: Every evening | ORAL | 0 refills | Status: DC | PRN

## 2021-08-05 MED ORDER — DULOXETINE HCL 60 MG PO CPEP: 60.0000 mg | ORAL_CAPSULE | Freq: Every day | ORAL | 0 refills | Status: DC
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
## 2021-08-12 ENCOUNTER — Other Ambulatory Visit (HOSPITAL_COMMUNITY): Payer: Self-pay | Admitting: Psychiatry

## 2021-08-12 ENCOUNTER — Telehealth (HOSPITAL_COMMUNITY): Payer: Self-pay | Admitting: *Deleted

## 2021-08-12 DIAGNOSIS — F4310 Post-traumatic stress disorder, unspecified: Secondary | ICD-10-CM

## 2021-08-12 NOTE — Telephone Encounter (Signed)
Writer has called pt twice regarding picking up the Abilify 10 mg samples. Both times having to leave a VM. Samples ordered on 08/05/21. Pt encouraged to return call to inform if pt is still interested in picking up samples.

## 2021-08-20 ENCOUNTER — Encounter: Payer: Self-pay | Admitting: Physician Assistant

## 2021-08-27 ENCOUNTER — Telehealth (HOSPITAL_COMMUNITY): Payer: Self-pay | Admitting: *Deleted

## 2021-08-27 NOTE — Telephone Encounter (Signed)
Writer did speak with pt on 08/13/21 regarding picking up samples of Abilify 10 mg tablets. Pt states that she had forgotten about it. Pt asked about office hours and writer advised that we close at 5:00 except on Fridays, which was the following day, when we close at 12:00. Pt stated hse would be here before 12:00 for sure on the 23rd. Pt did not answer anymore calls or picked up samples so writer will be returning samples to stock.

## 2021-08-28 ENCOUNTER — Other Ambulatory Visit: Payer: Self-pay | Admitting: Family Medicine

## 2021-08-28 ENCOUNTER — Other Ambulatory Visit: Payer: Self-pay | Admitting: Physician Assistant

## 2021-09-14 NOTE — Progress Notes (Unsigned)
09/15/21- 36 yoF Smoker for sleep evaluation courtesy of Dr Greta Doom with concern of OSA. Medical problem list includes Migraine, Chronic Pansinusitis, Gastroparesis, GERD, IBS, Lumbar Disc Disease,  Hidradinitis, Osteoarthritis, Bipolar, Dyslipidemia, Morbid Obesity,  HST 01/14/16- AHI 9.2/ hr, desaturation to 82%, body weight 217 lbs. Was being managed by Dr Halford Chessman on CPAP 7/ [employer] at last visit in 2020. Epworth score- Body weight today-

## 2021-09-15 ENCOUNTER — Encounter: Payer: Self-pay | Admitting: Internal Medicine

## 2021-09-15 ENCOUNTER — Ambulatory Visit (INDEPENDENT_AMBULATORY_CARE_PROVIDER_SITE_OTHER): Payer: Medicare Other | Admitting: Internal Medicine

## 2021-09-15 DIAGNOSIS — G4733 Obstructive sleep apnea (adult) (pediatric): Secondary | ICD-10-CM

## 2021-09-15 DIAGNOSIS — R682 Dry mouth, unspecified: Secondary | ICD-10-CM | POA: Diagnosis not present

## 2021-09-15 DIAGNOSIS — Z720 Tobacco use: Secondary | ICD-10-CM | POA: Diagnosis not present

## 2021-09-15 NOTE — Patient Instructions (Signed)
Order- schedule home sleep test   dx OSA  Please call us for results and recommendations about 2 weeks after your sleep test.   Suggest you try OTC Biotene mouth wash at bedtime and as needed. See if it helps some with dry mouth.

## 2021-09-16 ENCOUNTER — Encounter: Payer: Self-pay | Admitting: Internal Medicine

## 2021-09-16 DIAGNOSIS — Z720 Tobacco use: Secondary | ICD-10-CM | POA: Insufficient documentation

## 2021-09-16 DIAGNOSIS — R682 Dry mouth, unspecified: Secondary | ICD-10-CM | POA: Insufficient documentation

## 2021-09-16 NOTE — Assessment & Plan Note (Signed)
Previously mild OSA without subjective dramatic response to CPAP.  Unclear how important OSA is to her overall sense of wellbeing.  She agrees that our first step is to clarify and address this with a new sleep study. Plan- schedule sleep study

## 2021-09-16 NOTE — Assessment & Plan Note (Signed)
This is likely related to medications and perrhaps mouth breathing.   Consider w/u for Sjogrens if appropriate.

## 2021-09-16 NOTE — Assessment & Plan Note (Signed)
Ongoing despite counseling.  This is likely to aggravate her dry mouth complaint.

## 2021-09-21 ENCOUNTER — Encounter (HOSPITAL_COMMUNITY): Payer: Self-pay | Admitting: Psychiatry

## 2021-09-21 ENCOUNTER — Telehealth (HOSPITAL_BASED_OUTPATIENT_CLINIC_OR_DEPARTMENT_OTHER): Payer: Medicare Other | Admitting: Psychiatry

## 2021-09-21 DIAGNOSIS — F4310 Post-traumatic stress disorder, unspecified: Secondary | ICD-10-CM

## 2021-09-21 DIAGNOSIS — F319 Bipolar disorder, unspecified: Secondary | ICD-10-CM | POA: Diagnosis not present

## 2021-09-21 MED ORDER — LAMOTRIGINE 200 MG PO TABS: 200.0000 mg | ORAL_TABLET | Freq: Every day | ORAL | 0 refills | Status: DC

## 2021-09-21 MED ORDER — ARIPIPRAZOLE 5 MG PO TABS: 5.0000 mg | ORAL_TABLET | Freq: Every day | ORAL | 0 refills | Status: DC

## 2021-09-21 MED ORDER — TRAZODONE HCL 100 MG PO TABS: 200.0000 mg | ORAL_TABLET | Freq: Every evening | ORAL | 1 refills | Status: DC | PRN

## 2021-09-21 MED ORDER — DULOXETINE HCL 60 MG PO CPEP: 60.0000 mg | ORAL_CAPSULE | Freq: Every day | ORAL | 0 refills | Status: DC
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
## 2021-09-30 ENCOUNTER — Telehealth: Payer: Self-pay | Admitting: Physician Assistant

## 2021-09-30 NOTE — Telephone Encounter (Signed)
Copied from [place] [id number]. Topic: Medicare AWV >> Sep 30, 2021 12:40 PM Devoria Glassing wrote: Reason for CRM: Left message for patient to schedule Annual Wellness Visit.  Please schedule with Nurse Health Advisor Charlott Rakes, RN at [facility]. This appt can be telephone or office visit. Please call [phone] ask for [facility]

## 2021-10-05 ENCOUNTER — Telehealth: Payer: Self-pay

## 2021-10-05 ENCOUNTER — Ambulatory Visit (INDEPENDENT_AMBULATORY_CARE_PROVIDER_SITE_OTHER): Payer: Medicare Other

## 2021-10-05 VITALS — BP 134/72 | HR 88 | Temp 98.5°F | Wt 242.0 lb

## 2021-10-05 DIAGNOSIS — Z5941 Food insecurity: Secondary | ICD-10-CM | POA: Diagnosis not present

## 2021-10-05 DIAGNOSIS — Z122 Encounter for screening for malignant neoplasm of respiratory organs: Secondary | ICD-10-CM

## 2021-10-05 DIAGNOSIS — Z0000 Encounter for general adult medical examination without abnormal findings: Secondary | ICD-10-CM

## 2021-10-05 NOTE — Patient Instructions (Addendum)
Ms. Cordy , Thank you for taking time to come for your Medicare Wellness Visit. I appreciate your ongoing commitment to your health goals. Please review the following plan we discussed and let me know if I can assist you in the future.   Screening recommendations/referrals: Colonoscopy: Done 05/07/21 repeat every 10 years  Mammogram: done 01/05/21 repeat every year  Recommended yearly ophthalmology/optometry visit for glaucoma screening and checkup Recommended yearly dental visit for hygiene and checkup  Vaccinations: Influenza vaccine: done 12/02/20 repeat every year  Pneumococcal vaccine: due Tdap vaccine: done 11/28/19 repeat every 10 years  Shingles vaccine: Shingrix discussed. Please contact your pharmacy for coverage information.    Covid-19:declined and discussed   Advanced directives: Advance directive discussed with you today. Even though you declined this today please call our office should you change your mind and we can give you the proper paperwork for you to fill out.  Conditions/risks identified: lose weight   Next appointment: Follow up in one year for your annual wellness visit  Preventive Care 40-64 Years, Female Preventive care refers to lifestyle choices and visits with your health care provider that can promote health and wellness. What does preventive care include? A yearly physical exam. This is also called an annual well check. Dental exams once or twice a year. Routine eye exams. Ask your health care provider how often you should have your eyes checked. Personal lifestyle choices, including: Daily care of your teeth and gums. Regular physical activity. Eating a healthy diet. Avoiding tobacco and drug use. Limiting alcohol use. Practicing safe sex. Taking low-dose aspirin daily starting at age 71. Taking vitamin and mineral supplements as recommended by your health care provider. What happens during an annual well check? The services and screenings done by  your health care provider during your annual well check will depend on your age, overall health, lifestyle risk factors, and family history of disease. Counseling  Your health care provider may ask you questions about your: Alcohol use. Tobacco use. Drug use. Emotional well-being. Home and relationship well-being. Sexual activity. Eating habits. Work and work Statistician. Method of birth control. Menstrual cycle. Pregnancy history. Screening  You may have the following tests or measurements: Height, weight, and BMI. Blood pressure. Lipid and cholesterol levels. These may be checked every 5 years, or more frequently if you are over 23 years old. Skin check. Lung cancer screening. You may have this screening every year starting at age 54 if you have a 30-pack-year history of smoking and currently smoke or have quit within the past 15 years. Fecal occult blood test (FOBT) of the stool. You may have this test every year starting at age 73. Flexible sigmoidoscopy or colonoscopy. You may have a sigmoidoscopy every 5 years or a colonoscopy every 10 years starting at age 63. Hepatitis C blood test. Hepatitis B blood test. Sexually transmitted disease (STD) testing. Diabetes screening. This is done by checking your blood sugar (glucose) after you have not eaten for a while (fasting). You may have this done every 1-3 years. Mammogram. This may be done every 1-2 years. Talk to your health care provider about when you should start having regular mammograms. This may depend on whether you have a family history of breast cancer. BRCA-related cancer screening. This may be done if you have a family history of breast, ovarian, tubal, or peritoneal cancers. Pelvic exam and Pap test. This may be done every 3 years starting at age 39. Starting at age 75, this may be done  every 5 years if you have a Pap test in combination with an HPV test. Bone density scan. This is done to screen for osteoporosis. You may  have this scan if you are at high risk for osteoporosis. Discuss your test results, treatment options, and if necessary, the need for more tests with your health care provider. Vaccines  Your health care provider may recommend certain vaccines, such as: Influenza vaccine. This is recommended every year. Tetanus, diphtheria, and acellular pertussis (Tdap, Td) vaccine. You may need a Td booster every 10 years. Zoster vaccine. You may need this after age 70. Pneumococcal 13-valent conjugate (PCV13) vaccine. You may need this if you have certain conditions and were not previously vaccinated. Pneumococcal polysaccharide (PPSV23) vaccine. You may need one or two doses if you smoke cigarettes or if you have certain conditions. Talk to your health care provider about which screenings and vaccines you need and how often you need them. This information is not intended to replace advice given to you by your health care provider. Make sure you discuss any questions you have with your health care provider. Document Released: 03/07/2015 Document Revised: 10/29/2015 Document Reviewed: 12/10/2014 Elsevier Interactive Patient Education  2017 [place] Prevention in the Home Falls can cause injuries. They can happen to people of all ages. There are many things you can do to make your home safe and to help prevent falls. What can I do on the outside of my home? Regularly fix the edges of walkways and driveways and fix any cracks. Remove anything that might make you trip as you walk through a door, such as a raised step or threshold. Trim any bushes or trees on the path to your home. Use bright outdoor lighting. Clear any walking paths of anything that might make someone trip, such as rocks or tools. Regularly check to see if handrails are loose or broken. Make sure that both sides of any steps have handrails. Any raised decks and porches should have guardrails on the edges. Have any leaves, snow,  or ice cleared regularly. Use sand or salt on walking paths during winter. Clean up any spills in your garage right away. This includes oil or grease spills. What can I do in the bathroom? Use night lights. Install grab bars by the toilet and in the tub and shower. Do not use towel bars as grab bars. Use non-skid mats or decals in the tub or shower. If you need to sit down in the shower, use a plastic, non-slip stool. Keep the floor dry. Clean up any water that spills on the floor as soon as it happens. Remove soap buildup in the tub or shower regularly. Attach bath mats securely with double-sided non-slip rug tape. Do not have throw rugs and other things on the floor that can make you trip. What can I do in the bedroom? Use night lights. Make sure that you have a light by your bed that is easy to reach. Do not use any sheets or blankets that are too big for your bed. They should not hang down onto the floor. Have a firm chair that has side arms. You can use this for support while you get dressed. Do not have throw rugs and other things on the floor that can make you trip. What can I do in the kitchen? Clean up any spills right away. Avoid walking on wet floors. Keep items that you use a lot in easy-to-reach places. If you need  to reach something above you, use a strong step stool that has a grab bar. Keep electrical cords out of the way. Do not use floor polish or wax that makes floors slippery. If you must use wax, use non-skid floor wax. Do not have throw rugs and other things on the floor that can make you trip. What can I do with my stairs? Do not leave any items on the stairs. Make sure that there are handrails on both sides of the stairs and use them. Fix handrails that are broken or loose. Make sure that handrails are as long as the stairways. Check any carpeting to make sure that it is firmly attached to the stairs. Fix any carpet that is loose or worn. Avoid having throw rugs  at the top or bottom of the stairs. If you do have throw rugs, attach them to the floor with carpet tape. Make sure that you have a light switch at the top of the stairs and the bottom of the stairs. If you do not have them, ask someone to add them for you. What else can I do to help prevent falls? Wear shoes that: Do not have high heels. Have rubber bottoms. Are comfortable and fit you well. Are closed at the toe. Do not wear sandals. If you use a stepladder: Make sure that it is fully opened. Do not climb a closed stepladder. Make sure that both sides of the stepladder are locked into place. Ask someone to hold it for you, if possible. Clearly mark and make sure that you can see: Any grab bars or handrails. First and last steps. Where the edge of each step is. Use tools that help you move around (mobility aids) if they are needed. These include: Canes. Walkers. Scooters. Crutches. Turn on the lights when you go into a dark area. Replace any light bulbs as soon as they burn out. Set up your furniture so you have a clear path. Avoid moving your furniture around. If any of your floors are uneven, fix them. If there are any pets around you, be aware of where they are. Review your medicines with your doctor. Some medicines can make you feel dizzy. This can increase your chance of falling. Ask your doctor what other things that you can do to help prevent falls. This information is not intended to replace advice given to you by your health care provider. Make sure you discuss any questions you have with your health care provider. Document Released: 12/05/2008 Document Revised: 07/17/2015 Document Reviewed: 03/15/2014 Elsevier Interactive Patient Education  2017 [employer].

## 2021-10-05 NOTE — Progress Notes (Addendum)
.  Subjective:   [name] White is a 56 y.o. female who presents for an Initial Medicare Annual Wellness Visit.  Review of Systems     Cardiac Risk Factors include: advanced age (>28mn, >>20women);dyslipidemia;obesity (BMI >30kg/m2);sedentary lifestyle;smoking/ tobacco exposure     Objective:    Today's Vitals   10/05/21 1310 10/05/21 1311  BP: 134/72   Pulse: 88   Temp: 98.5 F (36.9 C)   SpO2: 92%   Weight: 242 lb (109.8 kg)   PainSc:  7    Body mass index is 40.27 kg/m.     10/05/2021    1:19 PM 05/07/2021    7:17 AM 05/01/2020   10:50 AM 03/21/2019    3:22 PM 09/07/2018   11:52 AM 10/02/2017    8:44 PM 04/14/2017    3:22 PM  Advanced Directives  Does Patient Have a Medical Advance Directive? No Yes No No Yes No No  Type of Advance Directive  [place]?  No - copy requested       Would patient like information on creating a medical advance directive? No - Patient declined     No - Patient declined No - Patient declined    Current Medications (verified) Outpatient Encounter Medications as of 10/05/2021  Medication Sig   acetaminophen (TYLENOL) 500 MG tablet Take 2,000 mg by mouth daily as needed for moderate pain.   ARIPiprazole (ABILIFY) 5 MG tablet Take 1 tablet (5 mg total) by mouth daily.   carboxymethylcellulose (REFRESH TEARS) 0.5 % SOLN Place 1 drop into both eyes 2 (two) times daily.   Cyanocobalamin (B-12 PO) Take 1 tablet by mouth daily.   dicyclomine (BENTYL) 10 MG capsule TAKE 1 CAPSULE (10 MG TOTAL) BY MOUTH 4 (FOUR) TIMES DAILY - BEFORE MEALS AND AT BEDTIME. (Patient taking differently: Take 10 mg by mouth 2 (two) times daily.)   DULoxetine (CYMBALTA) 60 MG capsule Take 1 capsule (60 mg total) by mouth at bedtime.   furosemide (LASIX) 20 MG tablet TAKE 1 TABLET BY MOUTH DAILY AS NEEDED FOR FLUID.   HYDROmorphone (DILAUDID) 2 MG tablet Take 2 mg by mouth 5 (five) times daily.    lamoTRIgine (LAMICTAL) 200 MG tablet Take 1 tablet (200 mg total) by mouth at bedtime.   pantoprazole (PROTONIX) 40 MG tablet TAKE 1 TABLET BY MOUTH EVERY DAY   pravastatin (PRAVACHOL) 20 MG tablet TAKE 1 TABLET BY MOUTH EVERY DAY   rizatriptan (MAXALT-MLT) 10 MG disintegrating tablet Take 1 tablet (10 mg total) by mouth as needed for migraine. May repeat in 2 hours if needed.  Maximum 2 tablets in 24 hours   rOPINIRole (REQUIP) 1 MG tablet TAKE 2 TABLETS (2 MG TOTAL) BY MOUTH AT BEDTIME.   topiramate (TOPAMAX) 200 MG tablet TAKE 1 TABLET BY MOUTH EVERYDAY AT BEDTIME   traZODone (DESYREL) 100 MG tablet Take 2 tablets (200 mg total) by mouth at bedtime as needed for sleep.   No facility-administered encounter medications on file as of 10/05/2021.    Allergies (verified) Morphine, Penicillins, and Clarithromycin   History: Past Medical History:  Diagnosis Date   Anterolisthesis    Anxiety    per pt, has panic attacks   Arthritis    in back per pt.   Arthritis of right hip    hips   Barrett's esophagus    egd 2007 + 2009 but none 2011 or 08/2010  egd   Bipolar disorder ([place])    Chronic headache    Colon polyp    hyperplastic   Depression    hx of suicide attempt in 2003   GERD (gastroesophageal reflux disease)    Grief reaction    Hidradenitis suppurativa    IBS (irritable bowel syndrome)    Migraine    OSA (obstructive sleep apnea)    does not use cpap   Ovarian cyst    Restless leg    Tobacco abuse    Past Surgical History:  Procedure Laterality Date   ABDOMINAL HYSTERECTOMY     secondary to endometriosis   BACK SURGERY Right 2019   lumbar ablation L3-S1 x 2    BIOPSY  05/07/2021   Procedure: BIOPSY;  Surgeon: Irving Copas., MD;  Location: [place];  Service: Gastroenterology;;   CESAREAN SECTION     x 2   CHOLECYSTECTOMY     COLONOSCOPY W/ POLYPECTOMY  2012   COLONOSCOPY WITH PROPOFOL N/A 05/01/2020   Procedure: COLONOSCOPY WITH PROPOFOL;  Surgeon:  Irving Copas., MD;  Location: [place];  Service: Gastroenterology;  Laterality: N/A;   COLONOSCOPY WITH PROPOFOL N/A 05/07/2021   Procedure: COLONOSCOPY WITH PROPOFOL;  Surgeon: Rush Landmark Telford Nab., MD;  Location: [place];  Service: Gastroenterology;  Laterality: N/A;   ELBOW SURGERY     left   ENDOSCOPIC MUCOSAL RESECTION N/A 05/01/2020   Procedure: ENDOSCOPIC MUCOSAL RESECTION;  Surgeon: Rush Landmark Telford Nab., MD;  Location: [place];  Service: Gastroenterology;  Laterality: N/A;   ENDOSCOPIC MUCOSAL RESECTION N/A 05/07/2021   Procedure: ENDOSCOPIC MUCOSAL RESECTION;  Surgeon: Rush Landmark Telford Nab., MD;  Location: [place];  Service: Gastroenterology;  Laterality: N/A;   HEMOSTASIS CLIP PLACEMENT  05/01/2020   Procedure: HEMOSTASIS CLIP PLACEMENT;  Surgeon: Irving Copas., MD;  Location: [place];  Service: Gastroenterology;;   HEMOSTASIS CLIP PLACEMENT  05/07/2021   Procedure: HEMOSTASIS CLIP PLACEMENT;  Surgeon: Irving Copas., MD;  Location: [place];  Service: Gastroenterology;;   HOT HEMOSTASIS N/A 05/07/2021   Procedure: HOT HEMOSTASIS (ARGON PLASMA COAGULATION/BICAP);  Surgeon: Irving Copas., MD;  Location: [place];  Service: Gastroenterology;  Laterality: N/A;   LAPAROSCOPY  04-22-10   lysis of adhesions   LAPAROSCOPY  04-30-10    with mini laparotomy and cystotomy repair   plantar fascitis     right foot   POLYPECTOMY  05/01/2020   Procedure: POLYPECTOMY;  Surgeon: Mansouraty, Telford Nab., MD;  Location: [place];  Service: Gastroenterology;;   POLYPECTOMY  05/07/2021   Procedure: POLYPECTOMY;  Surgeon: Irving Copas., MD;  Location: [place];  Service: Gastroenterology;;   SALPINGECTOMY  04-22-10   right   SUBMUCOSAL LIFTING INJECTION  05/01/2020   Procedure: SUBMUCOSAL LIFTING INJECTION;  Surgeon: Irving Copas., MD;  Location: [place];  Service: Gastroenterology;;   Maryagnes Amos INJECTION  05/07/2021   Procedure: SUBMUCOSAL LIFTING INJECTION;  Surgeon: Irving Copas., MD;  Location: [place];  Service: Gastroenterology;;   TOTAL HIP ARTHROPLASTY Right 04/25/2017   Procedure: RIGHT TOTAL HIP ARTHROPLASTY ANTERIOR APPROACH;  Surgeon: Rod Can, MD;  Location: [place];  Service: Orthopedics;  Laterality: Right;  Needs RNFA   TUBAL LIGATION     WRIST SURGERY     right/ganglion cyst   Family History  Problem Relation Age of Onset   COPD Mother    Stroke Mother    Coronary artery disease Mother        pacer   Colon  polyps Mother    Breast cancer Mother    Migraines Mother    Other Mother        RSDS   Rheum arthritis Mother    Hypertension Father    Colon polyps Brother    Colon cancer Maternal Grandfather    Colon polyps Maternal Grandfather    Breast cancer Maternal Aunt    Uterine cancer Cousin        x 2   Aneurysm Maternal Grandmother    Kidney disease Brother    Other Brother        back pain   Kidney cancer Paternal Aunt    Breast cancer Cousin    Stomach cancer Neg Hx    Pancreatic cancer Neg Hx    Esophageal cancer Neg Hx    Liver disease Neg Hx    Inflammatory bowel disease Neg Hx    Social History   Socioeconomic History   Marital status: Divorced    Spouse name: Not on file   Number of children: 2   Years of education: Not on file   Highest education level: Not on file  Occupational History   Occupation: Bartender  Tobacco Use   Smoking status: Every Day    Packs/day: 1.00    Years: 40.00    Total pack years: 40.00    Types: Cigarettes    Start date: 10/16/1980   Smokeless tobacco: Never   Tobacco comments:    Has smoked off and on since the age of 37, quit while pregnant twice and also quit in 2017 but restarted in 2022  Vaping Use   Vaping Use: Never used  Substance and Sexual Activity   Alcohol use: Yes    Alcohol/week: 6.0 standard drinks of alcohol    Types: 6 Standard drinks or equivalent per  week    Comment: wine/liquor 2 x a month   Drug use: No   Sexual activity: Yes    Birth control/protection: Surgical    Comment: Hysterectomy  Other Topics Concern   Not on file  Social History Narrative   Occupation: bartender   Divorced  -- currently single   Alcohol use-yes -x 5 per weekend   No longer a smoker   Social Determinants of Health   Financial Resource Strain: Medium Risk (10/05/2021)   Overall Financial Resource Strain (CARDIA)    Difficulty of Paying Living Expenses: Somewhat hard  Food Insecurity: Food Insecurity Present (10/05/2021)   Hunger Vital Sign    Worried About Running Out of Food in the Last Year: Sometimes true    Ran Out of Food in the Last Year: Sometimes true  Transportation Needs: No Transportation Needs (10/05/2021)   PRAPARE - Hydrologist (Medical): No    Lack of Transportation (Non-Medical): No  Physical Activity: Sufficiently Active (10/05/2021)   Exercise Vital Sign    Days of Exercise per Week: 3 days    Minutes of Exercise per Session: 60 min  Stress: Stress Concern Present (10/05/2021)   Golf    Feeling of Stress : Rather much  Social Connections: Socially Isolated (10/05/2021)   Social Connection and Isolation Panel [NHANES]    Frequency of Communication with Friends and Family: More than three times a week    Frequency of Social Gatherings with Friends and Family: Once a week    Attends Religious Services: Never    Marine scientist or Organizations: No  Attends Archivist Meetings: Never    Marital Status: Divorced    Tobacco Counseling Ready to quit: Not Answered Counseling given: Not Answered Tobacco comments: Has smoked off and on since the age of 59, quit while pregnant twice and also quit in 2017 but restarted in 2022   Clinical Intake:  Pre-visit preparation completed: Yes  Pain : 0-10 Pain Score: 7   Pain Type: Chronic pain Pain Location: Back Pain Orientation: Lower Pain Descriptors / Indicators: Aching, Throbbing Pain Onset: More than a month ago Pain Frequency: Constant     BMI - recorded: 40.27 Nutritional Status: BMI > 30  Obese Nutritional Risks: None Diabetes: No  How often do you need to have someone help you when you read instructions, pamphlets, or other written materials from your doctor or pharmacy?: 1 - Never  Diabetic?no  Interpreter Needed?: No  Information entered by :: Charlott Rakes, LPN   Activities of Daily Living    10/05/2021    1:20 PM  In your present state of health, do you have any difficulty performing the following activities:  Hearing? 0  Vision? 0  Difficulty concentrating or making decisions? 0  Walking or climbing stairs? 1  Comment related to hip  Dressing or bathing? 0  Doing errands, shopping? 0  Preparing Food and eating ? N  Using the Toilet? N  In the past six months, have you accidently leaked urine? Y  Comment wears a pad at times  Do you have problems with loss of bowel control? N  Managing your Medications? N  Managing your Finances? N  Housekeeping or managing your Housekeeping? N    Patient Care Team: Inda Coke, [place] as PCP - General (Physician Assistant) Sueanne Margarita, MD as PCP - Cardiology (Cardiology) Lafayette Dragon, MD (Inactive) as Consulting Physician (Gastroenterology) Maisie Fus, MD (Inactive) as Consulting Physician (Obstetrics and Gynecology) Nicholaus Bloom, MD (Anesthesiology) Rod Can, MD as Consulting Physician (Orthopedic Surgery)  Indicate any recent Medical Services you may have received from other than [facility] providers in the past year (date may be approximate).     Assessment:   This is a routine wellness examination for [name] White.  Hearing/Vision screen Hearing Screening - Comments:: Pt denies any hearing issues  Vision Screening - Comments:: Pt follows up with Dr Idolina Primer for  annual eye exams   Dietary issues and exercise activities discussed: Current Exercise Habits: Home exercise routine, Type of exercise: walking;Other - see comments (recumbent bike), Time (Minutes): 60, Frequency (Times/Week): 3, Weekly Exercise (Minutes/Week): 180   Goals Addressed             This Visit's Progress    Patient Stated       Lose weight        Depression Screen    10/05/2021    1:14 PM 12/02/2020   10:39 AM 04/29/2020    2:04 PM 11/28/2019   10:40 AM 11/09/2019    1:06 PM 10/04/2019   11:27 AM 04/19/2019   10:39 AM  PHQ 2/9 Scores  PHQ - 2 Score 3 0  4 0 2 2  PHQ- 9 Score 12   12        Information is confidential and restricted. Go to Review Flowsheets to unlock data.    Fall Risk    10/05/2021    1:20 PM 12/02/2020   10:39 AM 11/09/2019    1:06 PM 10/04/2019   11:27 AM 05/04/2019   10:54 AM  Fall Risk  Falls in the past year? 1 0 0 0 0  Number falls in past yr: 1 0     Injury with Fall? 1 0     Comment back hurt more      Risk for fall due to : Impaired vision;Impaired balance/gait      Follow up Falls prevention discussed        FALL RISK PREVENTION PERTAINING TO THE HOME:  Any stairs in or around the home? No  If so, are there any without handrails? No  Home free of loose throw rugs in walkways, pet beds, electrical cords, etc? Yes  Adequate lighting in your home to reduce risk of falls? Yes   ASSISTIVE DEVICES UTILIZED TO PREVENT FALLS:  Life alert? No  Use of a cane, walker or w/c? Yes  Grab bars in the bathroom? Yes  Shower chair or bench in shower? No  Elevated toilet seat or a handicapped toilet? No   TIMED UP AND GO:  Was the test performed? Yes .  Length of time to ambulate 10 feet: 15 sec.   Gait steady and fast without use of assistive device  Cognitive Function:        10/05/2021    1:23 PM  6CIT Screen  What Year? 0 points  What month? 0 points  What time? 0 points  Count back from 20 0 points  Months in reverse 0  points  Repeat phrase 0 points  Total Score 0 points    Immunizations Immunization History  Administered Date(s) Administered   Influenza Split 11/24/2011   Influenza Whole 11/20/2009   Influenza,inj,Quad PF,6+ Mos 01/28/2014, 12/12/2017, 02/28/2019, 11/28/2019, 12/02/2020   Pneumococcal Conjugate-13 01/28/2014   Td 11/20/2009   Tdap 11/28/2019   Zoster Recombinat (Shingrix) 08/18/2021    TDAP status: Up to date  Flu Vaccine status: Up to date  Pneumococcal vaccine status: Due, Education has been provided regarding the importance of this vaccine. Advised may receive this vaccine at local pharmacy or Health Dept. Aware to provide a copy of the vaccination record if obtained from local pharmacy or Health Dept. Verbalized acceptance and understanding.  Covid-19 vaccine status: Declined, Education has been provided regarding the importance of this vaccine but patient still declined. Advised may receive this vaccine at local pharmacy or Health Dept.or vaccine clinic. Aware to provide a copy of the vaccination record if obtained from local pharmacy or Health Dept. Verbalized acceptance and understanding.  Qualifies for Shingles Vaccine? Yes   Zostavax completed Yes   Shingrix Completed?: Yes  Screening Tests Health Maintenance  Topic Date Due   INFLUENZA VACCINE  09/22/2021   Zoster Vaccines- Shingrix (2 of 2) 10/13/2021   MAMMOGRAM  01/06/2023   TETANUS/TDAP  11/27/2029   COLONOSCOPY (Pts 45-110yr Insurance coverage will need to be confirmed)  05/08/2031   Hepatitis C Screening  Completed   HIV Screening  Completed   HPV VACCINES  Aged Out   COVID-19 Vaccine  Discontinued    Health Maintenance  Health Maintenance Due  Topic Date Due   INFLUENZA VACCINE  09/22/2021    Colorectal cancer screening: Type of screening: Colonoscopy. Completed 05/07/21. Repeat every 10 years  Mammogram status: Completed 01/05/21. Repeat every year    Lung Cancer Screening: (Low Dose CT  Chest recommended if Age 57-80years, 30 pack-year currently smoking OR have quit w/in 15years.) does qualify.   Lung Cancer Screening Referral: order p[laced 10/05/21  Additional Screening:  Hepatitis C Screening:  Completed 11/26/20  Vision  Screening: Recommended annual ophthalmology exams for early detection of glaucoma and other disorders of the eye. Is the patient up to date with their annual eye exam?  Yes  Who is the provider or what is the name of the office in which the patient attends annual eye exams? Dr Idolina Primer If pt is not established with a provider, would they like to be referred to a provider to establish care? No .   Dental Screening: Recommended annual dental exams for proper oral hygiene  Community Resource Referral / Chronic Care Management: CRR required this visit?  Yes   CCM required this visit?  No      Plan:     I have personally reviewed and noted the following in the patient's chart:   Medical and social history Use of alcohol, tobacco or illicit drugs  Current medications and supplements including opioid prescriptions. Patient is currently taking opioid prescriptions. Information provided to patient regarding non-opioid alternatives. Patient advised to discuss non-opioid treatment plan with their provider. Functional ability and status Nutritional status Physical activity Advanced directives List of other physicians Hospitalizations, surgeries, and ER visits in previous 12 months Vitals Screenings to include cognitive, depression, and falls Referrals and appointments  In addition, I have reviewed and discussed with patient certain preventive protocols, quality metrics, and best practice recommendations. A written personalized care plan for preventive services as well as general preventive health recommendations were provided to patient.     Willette Brace, LPN   [id number]   Nurse Notes: none

## 2021-10-05 NOTE — Telephone Encounter (Signed)
   Telephone encounter was:  Successful.  10/05/2021 Name: [name] MRN: [id number] DOB: 1965/07/30  [name] White is a 56 y.o. year old female who is a primary care patient of Inda Coke, [place] . The community resource team was consulted for assistance with [place] guide performed the following interventions: Spoke to patient verified email address sent [facility] [employer] and Pharmacist, hospital website/app. Patient has my name and number to call if she does not receive email.  Follow Up Plan:  No further follow up planned at this time. The patient has been provided with needed resources.  Tawanda Schall, AAS Paralegal, [facility]  [address], [place] [address] ??[email or web address]  ?? [phone]   [email or web address]

## 2021-10-06 ENCOUNTER — Other Ambulatory Visit: Payer: Self-pay | Admitting: Physician Assistant

## 2021-10-06 DIAGNOSIS — F17210 Nicotine dependence, cigarettes, uncomplicated: Secondary | ICD-10-CM

## 2021-10-07 ENCOUNTER — Ambulatory Visit: Payer: Medicare Other | Admitting: Physician Assistant

## 2021-10-07 ENCOUNTER — Encounter: Payer: Self-pay | Admitting: Physician Assistant

## 2021-10-09 ENCOUNTER — Other Ambulatory Visit: Payer: Self-pay | Admitting: Neurology

## 2021-10-12 ENCOUNTER — Encounter: Payer: Self-pay | Admitting: Physician Assistant

## 2021-10-12 ENCOUNTER — Ambulatory Visit: Payer: Medicare Other | Admitting: Physician Assistant

## 2021-10-12 VITALS — BP 120/72 | HR 81 | Temp 98.0°F | Ht 65.0 in | Wt 242.0 lb

## 2021-10-12 DIAGNOSIS — E669 Obesity, unspecified: Secondary | ICD-10-CM

## 2021-10-12 DIAGNOSIS — E8881 Metabolic syndrome: Secondary | ICD-10-CM

## 2021-10-12 DIAGNOSIS — Z23 Encounter for immunization: Secondary | ICD-10-CM

## 2021-10-12 LAB — POCT GLYCOSYLATED HEMOGLOBIN (HGB A1C): Hemoglobin A1C: 5.8 % — AB (ref 4.0–5.6)

## 2021-10-12 NOTE — Progress Notes (Signed)
[name] White is a 56 y.o. female here for a follow up of a pre-existing problem.  History of Present Illness:   Chief Complaint  Patient presents with   Obesity    Here to discuss weight loss options.    HPI  Obesity Daughter getting married next year in April. She is wanting to work on weight loss.  She feels like she does not hardly eat much food.  She is wanting to start on medication, and notes that her insurance will pay for phentermine.  Drinks ginger ale in the morning and Monster energy drinks throughout the day 11a -- bowl of cereal or frozen dinner 6p -- chicken wings Does not eat much sweets Does not eat many snacks  Currently doing about 30 min of stationary bike x about daily.  Insulin resistance Patient has a history of elevated A1c, was 6.0 about 3 years ago.  She was on metformin at one point but this was discontinued for unknown reasons.   Past Medical History:  Diagnosis Date   Anterolisthesis    Anxiety    per pt, has panic attacks   Arthritis    in back per pt.   Arthritis of right hip    hips   Barrett's esophagus    egd 2007 + 2009 but none 2011 or 08/2010 egd   Bipolar disorder (HCC)    Chronic headache    Colon polyp    hyperplastic   Depression    hx of suicide attempt in 2003   GERD (gastroesophageal reflux disease)    Grief reaction    Hidradenitis suppurativa    IBS (irritable bowel syndrome)    Migraine    OSA (obstructive sleep apnea)    does not use cpap   Ovarian cyst    Restless leg    Tobacco abuse      Social History   Tobacco Use   Smoking status: Every Day    Packs/day: 1.00    Years: 40.00    Total pack years: 40.00    Types: Cigarettes    Start date: 10/16/1980   Smokeless tobacco: Never   Tobacco comments:    Has smoked off and on since the age of 33, quit while pregnant twice and also quit in 2017 but restarted in 2022  Vaping Use   Vaping Use: Never used  Substance Use Topics   Alcohol use: Yes     Alcohol/week: 6.0 standard drinks of alcohol    Types: 6 Standard drinks or equivalent per week    Comment: wine/liquor 2 x a month   Drug use: No    Past Surgical History:  Procedure Laterality Date   ABDOMINAL HYSTERECTOMY     secondary to endometriosis   BACK SURGERY Right 2019   lumbar ablation L3-S1 x 2    BIOPSY  05/07/2021   Procedure: BIOPSY;  Surgeon: Irving Copas., MD;  Location: [facility];  Service: Gastroenterology;;   CESAREAN SECTION     x 2   CHOLECYSTECTOMY     COLONOSCOPY W/ POLYPECTOMY  2012   COLONOSCOPY WITH PROPOFOL N/A 05/01/2020   Procedure: COLONOSCOPY WITH PROPOFOL;  Surgeon: Irving Copas., MD;  Location: [facility];  Service: Gastroenterology;  Laterality: N/A;   COLONOSCOPY WITH PROPOFOL N/A 05/07/2021   Procedure: COLONOSCOPY WITH PROPOFOL;  Surgeon: Rush Landmark Telford Nab., MD;  Location: [facility];  Service: Gastroenterology;  Laterality: N/A;   ELBOW SURGERY     left   ENDOSCOPIC MUCOSAL RESECTION  N/A 05/01/2020   Procedure: ENDOSCOPIC MUCOSAL RESECTION;  Surgeon: Rush Landmark Telford Nab., MD;  Location: [facility];  Service: Gastroenterology;  Laterality: N/A;   ENDOSCOPIC MUCOSAL RESECTION N/A 05/07/2021   Procedure: ENDOSCOPIC MUCOSAL RESECTION;  Surgeon: Rush Landmark Telford Nab., MD;  Location: [facility];  Service: Gastroenterology;  Laterality: N/A;   HEMOSTASIS CLIP PLACEMENT  05/01/2020   Procedure: HEMOSTASIS CLIP PLACEMENT;  Surgeon: Irving Copas., MD;  Location: [facility];  Service: Gastroenterology;;   HEMOSTASIS CLIP PLACEMENT  05/07/2021   Procedure: HEMOSTASIS CLIP PLACEMENT;  Surgeon: Irving Copas., MD;  Location: [facility];  Service: Gastroenterology;;   HOT HEMOSTASIS N/A 05/07/2021   Procedure: HOT HEMOSTASIS (ARGON PLASMA COAGULATION/BICAP);  Surgeon: Irving Copas., MD;  Location: [facility];  Service: Gastroenterology;  Laterality: N/A;   LAPAROSCOPY  04-22-10   lysis of  adhesions   LAPAROSCOPY  04-30-10    with mini laparotomy and cystotomy repair   plantar fascitis     right foot   POLYPECTOMY  05/01/2020   Procedure: POLYPECTOMY;  Surgeon: Mansouraty, Telford Nab., MD;  Location: [facility];  Service: Gastroenterology;;   POLYPECTOMY  05/07/2021   Procedure: POLYPECTOMY;  Surgeon: Irving Copas., MD;  Location: [facility];  Service: Gastroenterology;;   SALPINGECTOMY  04-22-10   right   SUBMUCOSAL LIFTING INJECTION  05/01/2020   Procedure: SUBMUCOSAL LIFTING INJECTION;  Surgeon: Irving Copas., MD;  Location: [facility];  Service: Gastroenterology;;   Maryagnes Amos INJECTION  05/07/2021   Procedure: SUBMUCOSAL LIFTING INJECTION;  Surgeon: Irving Copas., MD;  Location: [facility];  Service: Gastroenterology;;   TOTAL HIP ARTHROPLASTY Right 04/25/2017   Procedure: RIGHT TOTAL HIP ARTHROPLASTY ANTERIOR APPROACH;  Surgeon: Rod Can, MD;  Location: [facility];  Service: Orthopedics;  Laterality: Right;  Needs RNFA   TUBAL LIGATION     WRIST SURGERY     right/ganglion cyst    Family History  Problem Relation Age of Onset   COPD Mother    Stroke Mother    Coronary artery disease Mother        pacer   Colon polyps Mother    Breast cancer Mother    Migraines Mother    Other Mother        RSDS   Rheum arthritis Mother    Hypertension Father    Colon polyps Brother    Colon cancer Maternal Grandfather    Colon polyps Maternal Grandfather    Breast cancer Maternal Aunt    Uterine cancer Cousin        x 2   Aneurysm Maternal Grandmother    Kidney disease Brother    Other Brother        back pain   Kidney cancer Paternal Aunt    Breast cancer Cousin    Stomach cancer Neg Hx    Pancreatic cancer Neg Hx    Esophageal cancer Neg Hx    Liver disease Neg Hx    Inflammatory bowel disease Neg Hx     Allergies  Allergen Reactions   Morphine Hives and Nausea And Vomiting    INTOLERANCE >  N & V   Penicillins  Hives, Itching and Other (See Comments)     PATIENT HAS HAD A PCN REACTION WITH IMMEDIATE RASH, FACIAL/TONGUE/THROAT SWELLING, SOB, OR LIGHTHEADEDNESS WITH HYPOTENSION:  #  #  #  YES  #  #  #   Has patient had a PCN reaction causing severe rash involving mucus membranes or skin necrosis: Unknown  Has patient had a PCN reaction that required hospitalization: No Has patient had a PCN reaction occurring within the last 10 years: No If all of the above answers are "NO", then may proceed with Cephalosporin use.    Clarithromycin Nausea And Vomiting    Current Medications:   Current Outpatient Medications:    acetaminophen (TYLENOL) 500 MG tablet, Take 2,000 mg by mouth daily as needed for moderate pain., Disp: , Rfl:    ARIPiprazole (ABILIFY) 5 MG tablet, Take 1 tablet (5 mg total) by mouth daily., Disp: 90 tablet, Rfl: 0   carboxymethylcellulose (REFRESH TEARS) 0.5 % SOLN, Place 1 drop into both eyes 2 (two) times daily., Disp: , Rfl:    Cyanocobalamin (B-12 PO), Take 1 tablet by mouth daily., Disp: , Rfl:    dicyclomine (BENTYL) 10 MG capsule, TAKE 1 CAPSULE (10 MG TOTAL) BY MOUTH 4 (FOUR) TIMES DAILY - BEFORE MEALS AND AT BEDTIME. (Patient taking differently: Take 10 mg by mouth 2 (two) times daily.), Disp: 360 capsule, Rfl: 0   DULoxetine (CYMBALTA) 60 MG capsule, Take 1 capsule (60 mg total) by mouth at bedtime., Disp: 90 capsule, Rfl: 0   furosemide (LASIX) 20 MG tablet, TAKE 1 TABLET BY MOUTH DAILY AS NEEDED FOR FLUID., Disp: 90 tablet, Rfl: 0   HYDROmorphone (DILAUDID) 2 MG tablet, Take 2 mg by mouth 5 (five) times daily., Disp: , Rfl:    lamoTRIgine (LAMICTAL) 200 MG tablet, Take 1 tablet (200 mg total) by mouth at bedtime., Disp: 90 tablet, Rfl: 0   pantoprazole (PROTONIX) 40 MG tablet, TAKE 1 TABLET BY MOUTH EVERY DAY, Disp: 90 tablet, Rfl: 1   pravastatin (PRAVACHOL) 20 MG tablet, TAKE 1 TABLET BY MOUTH EVERY DAY, Disp: 90 tablet, Rfl: 1   rizatriptan (MAXALT-MLT) 10 MG disintegrating  tablet, Take 1 tablet (10 mg total) by mouth as needed for migraine. May repeat in 2 hours if needed.  Maximum 2 tablets in 24 hours, Disp: 12 tablet, Rfl: 2   rOPINIRole (REQUIP) 1 MG tablet, TAKE 2 TABLETS (2 MG TOTAL) BY MOUTH AT BEDTIME., Disp: 180 tablet, Rfl: 1   topiramate (TOPAMAX) 200 MG tablet, TAKE 1 TABLET BY MOUTH EVERYDAY AT BEDTIME, Disp: 90 tablet, Rfl: 0   traZODone (DESYREL) 100 MG tablet, Take 2 tablets (200 mg total) by mouth at bedtime as needed for sleep., Disp: 60 tablet, Rfl: 1   Review of Systems:   ROS Negative unless otherwise specified per HPI.  Vitals:   Vitals:   10/12/21 1113  BP: 120/72  Pulse: 81  Temp: 98 F (36.7 C)  TempSrc: Temporal  SpO2: 97%  Weight: 242 lb (109.8 kg)  Height: '5\' 5"'$  (1.651 m)     Body mass index is 40.27 kg/m.  Physical Exam:   Physical Exam Vitals and nursing note reviewed.  Constitutional:      General: She is not in acute distress.    Appearance: She is well-developed. She is not ill-appearing or toxic-appearing.  Cardiovascular:     Rate and Rhythm: Normal rate and regular rhythm.     Pulses: Normal pulses.     Heart sounds: Normal heart sounds, S1 normal and S2 normal.  Pulmonary:     Effort: Pulmonary effort is normal.     Breath sounds: Normal breath sounds.  Skin:    General: Skin is warm and dry.  Neurological:     Mental Status: She is alert.     GCS: GCS eye subscore is 4. GCS verbal  subscore is 5. GCS motor subscore is 6.  Psychiatric:        Speech: Speech normal.        Behavior: Behavior normal. Behavior is cooperative.    Results for orders placed or performed in visit on 10/12/21  POCT HgB A1C  Result Value Ref Range   Hemoglobin A1C 5.8 (A) 4.0 - 5.6 %    Assessment and Plan:   Obesity, unspecified classification, unspecified obesity type, unspecified whether serious comorbidity present We are going to put in a referral for her to see a dietitian to work on more consistent and  healthier eating throughout the day I know that she work on reduction of sugary drinks and energy drinks She has not good candidate for phentermine due to her pain and mental health medications Her insurance will not approve We[employer] per her recent inquiry We will refer to [facility] wellness  Insulin resistance A1c is in the prediabetic range Consider restart of metformin, she will let me know if she would like to trial this  Inda Coke, PA-C

## 2021-10-20 ENCOUNTER — Other Ambulatory Visit (HOSPITAL_COMMUNITY): Payer: Self-pay | Admitting: Psychiatry

## 2021-10-20 DIAGNOSIS — F4310 Post-traumatic stress disorder, unspecified: Secondary | ICD-10-CM

## 2021-10-31 ENCOUNTER — Other Ambulatory Visit (HOSPITAL_COMMUNITY): Payer: Self-pay | Admitting: Psychiatry

## 2021-10-31 DIAGNOSIS — F4310 Post-traumatic stress disorder, unspecified: Secondary | ICD-10-CM

## 2021-11-03 ENCOUNTER — Ambulatory Visit
Admission: RE | Admit: 2021-11-03 | Discharge: 2021-11-03 | Disposition: A | Discharge status: Home / Self Care | Payer: Medicare Other | Source: Ambulatory Visit | Attending: Physician Assistant | Admitting: Physician Assistant

## 2021-11-03 DIAGNOSIS — F17210 Nicotine dependence, cigarettes, uncomplicated: Secondary | ICD-10-CM

## 2021-11-05 ENCOUNTER — Other Ambulatory Visit: Payer: Self-pay | Admitting: Physician Assistant

## 2021-11-05 ENCOUNTER — Telehealth: Payer: Self-pay | Admitting: Neurology

## 2021-11-05 NOTE — Telephone Encounter (Signed)
Pt called in and left a message. She accidentally threw away her 90 day supply of topiramate. She would like to see if we have samples?

## 2021-11-05 NOTE — Telephone Encounter (Signed)
Pt called no answer left a voice mail that we do no have samples she will have to pay out of pocket for refill

## 2021-11-05 NOTE — Telephone Encounter (Signed)
Pt requesting refill for Requip 1 mg, takes 2 mg daily at bedtime. Last OV 09/2021.

## 2021-11-06 ENCOUNTER — Encounter (HOSPITAL_COMMUNITY): Payer: Self-pay | Admitting: Psychiatry

## 2021-11-06 ENCOUNTER — Telehealth (HOSPITAL_BASED_OUTPATIENT_CLINIC_OR_DEPARTMENT_OTHER): Payer: Medicare Other | Admitting: Psychiatry

## 2021-11-06 VITALS — Wt 242.0 lb

## 2021-11-06 DIAGNOSIS — F4310 Post-traumatic stress disorder, unspecified: Secondary | ICD-10-CM

## 2021-11-06 DIAGNOSIS — F319 Bipolar disorder, unspecified: Secondary | ICD-10-CM | POA: Diagnosis not present

## 2021-11-06 MED ORDER — RISPERIDONE 0.5 MG PO TABS: 0.5000 mg | ORAL_TABLET | Freq: Every day | ORAL | 1 refills | Status: DC

## 2021-11-06 MED ORDER — DULOXETINE HCL 60 MG PO CPEP: 60.0000 mg | ORAL_CAPSULE | Freq: Every day | ORAL | 0 refills | Status: DC

## 2021-11-06 MED ORDER — TOPIRAMATE 200 MG PO TABS: ORAL_TABLET | ORAL | 0 refills | Status: DC

## 2021-11-06 MED ORDER — LAMOTRIGINE 200 MG PO TABS: 200.0000 mg | ORAL_TABLET | Freq: Every day | ORAL | 0 refills | Status: DC

## 2021-11-06 MED ORDER — TRAZODONE HCL 100 MG PO TABS: 200.0000 mg | ORAL_TABLET | Freq: Every evening | ORAL | 1 refills | Status: DC | PRN
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
## 2021-11-06 NOTE — Telephone Encounter (Signed)
Patient called after hours and left a message requesting a prescription be sent in to [facility].

## 2021-11-06 NOTE — Telephone Encounter (Signed)
Replacement refill sent to [facility] as requested for pt

## 2021-11-21 ENCOUNTER — Other Ambulatory Visit (HOSPITAL_COMMUNITY): Payer: Self-pay | Admitting: Psychiatry

## 2021-11-21 DIAGNOSIS — F4310 Post-traumatic stress disorder, unspecified: Secondary | ICD-10-CM

## 2021-11-23 ENCOUNTER — Other Ambulatory Visit: Payer: Self-pay | Admitting: Physician Assistant

## 2021-12-02 ENCOUNTER — Encounter: Payer: Self-pay | Admitting: Internal Medicine

## 2021-12-03 ENCOUNTER — Other Ambulatory Visit: Payer: Self-pay

## 2021-12-03 DIAGNOSIS — G4733 Obstructive sleep apnea (adult) (pediatric): Secondary | ICD-10-CM

## 2021-12-15 ENCOUNTER — Other Ambulatory Visit (HOSPITAL_COMMUNITY): Payer: Self-pay | Admitting: Psychiatry

## 2021-12-15 DIAGNOSIS — F4310 Post-traumatic stress disorder, unspecified: Secondary | ICD-10-CM

## 2021-12-30 ENCOUNTER — Other Ambulatory Visit: Payer: Self-pay | Admitting: Student

## 2021-12-30 DIAGNOSIS — M7541 Impingement syndrome of right shoulder: Secondary | ICD-10-CM

## 2022-01-05 NOTE — Progress Notes (Unsigned)
NEUROLOGY FOLLOW UP OFFICE NOTE  [name] White [id number]  Assessment/Plan:   Migraine without aura, without status migrainosus, not intractable OSA on CPAP     Migraine prevention:  topiramate '200mg'$  at bedtime Migraine rescue:  rizatriptan '10mg'$ , Zofran '4mg'$ .  I will have her come by to pick up samples of Nurtec to try and see if we can shorten migraine duration further. Limit use of pain relievers to no more than 2 days out of week to prevent risk of rebound or medication-overuse headache. 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 who follows up for migraine   UPDATE: Intensity:  Severe Duration:  3 hours with rizatriptan Frequency:  3 a month Current NSAIDS: None Current analgesics: Dilaudid Current triptans: Maxalt '10mg'$  Current ergotamine: None Current anti-emetic: Zofran Current muscle relaxants: None Current anti-anxiolytic: none Current sleep aide: trazodone Current Antihypertensive medications: None Current Antidepressant medications: Cymbalta '60mg'$  daily, trazodone Current Anticonvulsant medications: Topiramate 200 mg at bedtime, lamotrigine  Current anti-CGRP: None Current Vitamins/Herbal/Supplements: None Current Antihistamines/Decongestants: None Other therapy: None Hormone/birth control: none Other meds:  Abilify, ropinirole   Caffeine: No Diet: Increased water, decrease chocolate Exercise: No Depression: Yes; Anxiety: Yes Other pain: Back and hip pain Sleep hygiene: Improved with CPAP   HISTORY: Onset: Around early 67s Location:  Top of head, band-like distribution Quality:  squeezing Initial Intensity:  7/10 (sometimes 10/10):  She denies new headache, worst headache of her life or severe headache that wakes her up from sleep. Aura:  no Prodrome:  no Associated symptoms: Nausea, photophobia, phonophobia.  She denies unilateral numbness or  weakness. Initial Duration:  4 hours or sometimes up to a day Initial Frequency:  3 to 4 times a month Triggers: Tea Relieving factors:  Sleep in dark Activity:  Needs to lay down to sleep   Past NSAIDS:  ibuprofen Past analgesics:  Tylenol, Excedrin Past abortive triptans:  sumatriptan '100mg'$  Past muscle relaxants:no   Past anti-emetic:  Zofran '8mg'$ , Phenergan Past sleep aide:  melatonin, Ambien Past antihypertensive medications:  no Past antidepressant medications:  venlafaxine XR '150mg'$  (for depression).   Past anticonvulsant medications:  gabapentin '1200mg'$  three times daily, Lyrica '100mg'$  three times daily (both for neuropathic nerve pain from hip surgery)     Family history of headache:  Mother and grandmother had migraines.  Grandmother had aneurysm.   In 2017, she was reporting episodes of dizziness that occur suddenly, usually while walking.  It is not positional.  It is not spinning but rather a wave-like movement.  There is no associated nausea, double vision, focal numbness.  It lasts for a few minutes and resolves.  It occurs twice a day.  She takes meclizine, which is not too effective. CTA of head was unremarkable, revealing no vertebrobasilar insufficiency.  Etiology did not seem neurologic.   MRI of brain without contrast from 06/05/15 and showed complex enlarged pineal gland over 1.9 cm with mild flattening of the superior colliculus.  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    Repeat MRI of the brain with and without contrast from 07/26/16 again demonstrated stable non-enhancing pineal gland cyst, but otherwise unremarkable except for some paranasal sinus disease.  PAST MEDICAL HISTORY: Past Medical History:  Diagnosis Date   Anterolisthesis    Anxiety  per pt, has panic attacks   Arthritis    in back per pt.   Arthritis of right hip    hips   Barrett's esophagus     egd 2007 + 2009 but none 2011 or 08/2010 egd   Bipolar disorder ([place])    Chronic headache    Colon polyp    hyperplastic   Depression    hx of suicide attempt in 2003   GERD (gastroesophageal reflux disease)    Grief reaction    Hidradenitis suppurativa    IBS (irritable bowel syndrome)    Migraine    OSA (obstructive sleep apnea)    does not use cpap   Ovarian cyst    Restless leg    Tobacco abuse     MEDICATIONS: Current Outpatient Medications on File Prior to Visit  Medication Sig Dispense Refill   acetaminophen (TYLENOL) 500 MG tablet Take 2,000 mg by mouth daily as needed for moderate pain.     ARIPiprazole (ABILIFY) 5 MG tablet Take 1 tablet (5 mg total) by mouth daily. (Patient not taking: Reported on 11/06/2021) 90 tablet 0   carboxymethylcellulose (REFRESH TEARS) 0.5 % SOLN Place 1 drop into both eyes 2 (two) times daily.     Cyanocobalamin (B-12 PO) Take 1 tablet by mouth daily.     dicyclomine (BENTYL) 10 MG capsule TAKE 1 CAPSULE (10 MG TOTAL) BY MOUTH 4 (FOUR) TIMES DAILY - BEFORE MEALS AND AT BEDTIME. (Patient taking differently: Take 10 mg by mouth 2 (two) times daily.) 360 capsule 0   DULoxetine (CYMBALTA) 60 MG capsule Take 1 capsule (60 mg total) by mouth at bedtime. 90 capsule 0   furosemide (LASIX) 20 MG tablet TAKE 1 TABLET BY MOUTH DAILY AS NEEDED FOR FLUID. 90 tablet 0   HYDROmorphone (DILAUDID) 2 MG tablet Take 2 mg by mouth 5 (five) times daily.     lamoTRIgine (LAMICTAL) 200 MG tablet Take 1 tablet (200 mg total) by mouth at bedtime. 90 tablet 0   pantoprazole (PROTONIX) 40 MG tablet TAKE 1 TABLET BY MOUTH EVERY DAY 90 tablet 1   pravastatin (PRAVACHOL) 20 MG tablet TAKE 1 TABLET BY MOUTH EVERY DAY 90 tablet 1   risperiDONE (RISPERDAL) 0.5 MG tablet Take 1 tablet (0.5 mg total) by mouth at bedtime. 30 tablet 1   rizatriptan (MAXALT-MLT) 10 MG disintegrating tablet Take 1 tablet (10 mg total) by mouth as needed for migraine. May repeat in 2 hours if  needed.  Maximum 2 tablets in 24 hours 12 tablet 2   rOPINIRole (REQUIP) 1 MG tablet TAKE 2 TABLETS (2 MG TOTAL) BY MOUTH AT BEDTIME. 180 tablet 1   topiramate (TOPAMAX) 200 MG tablet TAKE 1 TABLET BY MOUTH EVERYDAY AT BEDTIME 90 tablet 0   traZODone (DESYREL) 100 MG tablet Take 2 tablets (200 mg total) by mouth at bedtime as needed for sleep. 60 tablet 1   No current facility-administered medications on file prior to visit.    ALLERGIES: Allergies  Allergen Reactions   Morphine Hives and Nausea And Vomiting    INTOLERANCE >  N & V   Penicillins Hives, Itching and Other (See Comments)     PATIENT HAS HAD A PCN REACTION WITH IMMEDIATE RASH, FACIAL/TONGUE/THROAT SWELLING, SOB, OR LIGHTHEADEDNESS WITH HYPOTENSION:  #  #  #  YES  #  #  #   Has patient had a PCN reaction causing severe rash involving mucus membranes or skin necrosis: Unknown Has patient had a PCN reaction  that required hospitalization: No Has patient had a PCN reaction occurring within the last 10 years: No If all of the above answers are "NO", then may proceed with Cephalosporin use.    Clarithromycin Nausea And Vomiting    FAMILY HISTORY: Family History  Problem Relation Age of Onset   COPD Mother    Stroke Mother    Coronary artery disease Mother        pacer   Colon polyps Mother    Breast cancer Mother    Migraines Mother    Other Mother        RSDS   Rheum arthritis Mother    Hypertension Father    Colon polyps Brother    Colon cancer Maternal Grandfather    Colon polyps Maternal Grandfather    Breast cancer Maternal Aunt    Uterine cancer Cousin        x 2   Aneurysm Maternal Grandmother    Kidney disease Brother    Other Brother        back pain   Kidney cancer Paternal Aunt    Breast cancer Cousin    Stomach cancer Neg Hx    Pancreatic cancer Neg Hx    Esophageal cancer Neg Hx    Liver disease Neg Hx    Inflammatory bowel disease Neg Hx       Objective:  *** General: No acute distress.   Patient appears well-groomed.   Head:  Normocephalic/atraumatic Eyes:  Fundi examined but not visualized Neck: supple, no paraspinal tenderness, full range of motion Heart:  Regular rate and rhythm Neurological Exam: alert and oriented to person, place, and time.  Speech fluent and not dysarthric, language intact.  CN II-XII intact. Bulk and tone normal, muscle strength 5/5 throughout.  Sensation to light touch intact.  Deep tendon reflexes 2+ throughout, toes downgoing.  Finger to nose testing intact.  Gait normal, Romberg negative.   Metta Clines, DO  CC: Inda Coke, PA

## 2022-01-06 ENCOUNTER — Encounter: Payer: Self-pay | Admitting: Physician Assistant

## 2022-01-07 ENCOUNTER — Telehealth (HOSPITAL_BASED_OUTPATIENT_CLINIC_OR_DEPARTMENT_OTHER): Payer: Medicare Other | Admitting: Psychiatry

## 2022-01-07 ENCOUNTER — Telehealth: Payer: Self-pay | Admitting: Gastroenterology

## 2022-01-07 ENCOUNTER — Encounter (HOSPITAL_COMMUNITY): Payer: Self-pay | Admitting: Psychiatry

## 2022-01-07 ENCOUNTER — Encounter: Payer: Self-pay | Admitting: Neurology

## 2022-01-07 ENCOUNTER — Ambulatory Visit: Payer: Medicare Other | Admitting: Neurology

## 2022-01-07 ENCOUNTER — Other Ambulatory Visit: Payer: Self-pay

## 2022-01-07 VITALS — BP 118/76 | HR 110 | Resp 20 | Ht 66.0 in | Wt 241.0 lb

## 2022-01-07 VITALS — Wt 241.0 lb

## 2022-01-07 DIAGNOSIS — F319 Bipolar disorder, unspecified: Secondary | ICD-10-CM

## 2022-01-07 DIAGNOSIS — G43009 Migraine without aura, not intractable, without status migrainosus: Secondary | ICD-10-CM

## 2022-01-07 DIAGNOSIS — F4310 Post-traumatic stress disorder, unspecified: Secondary | ICD-10-CM

## 2022-01-07 DIAGNOSIS — G4733 Obstructive sleep apnea (adult) (pediatric): Secondary | ICD-10-CM | POA: Diagnosis not present

## 2022-01-07 DIAGNOSIS — F4321 Adjustment disorder with depressed mood: Secondary | ICD-10-CM

## 2022-01-07 MED ORDER — RIZATRIPTAN BENZOATE 10 MG PO TBDP: 10.0000 mg | ORAL_TABLET | ORAL | 11 refills | Status: DC | PRN

## 2022-01-07 MED ORDER — TRAZODONE HCL 100 MG PO TABS: 200.0000 mg | ORAL_TABLET | Freq: Every evening | ORAL | 1 refills | Status: DC | PRN

## 2022-01-07 MED ORDER — LAMOTRIGINE 200 MG PO TABS: 200.0000 mg | ORAL_TABLET | Freq: Every day | ORAL | 0 refills | Status: DC

## 2022-01-07 MED ORDER — DULOXETINE HCL 60 MG PO CPEP: 60.0000 mg | ORAL_CAPSULE | Freq: Every day | ORAL | 0 refills | Status: DC

## 2022-01-07 MED ORDER — TOPIRAMATE 200 MG PO TABS: ORAL_TABLET | ORAL | 3 refills | Status: DC

## 2022-01-07 MED ORDER — RISPERIDONE 1 MG PO TABS: 1.0000 mg | ORAL_TABLET | Freq: Every day | ORAL | 1 refills | Status: DC

## 2022-01-07 MED ORDER — LINACLOTIDE 145 MCG PO CAPS: 145.0000 ug | ORAL_CAPSULE | Freq: Every day | ORAL | 6 refills | Status: DC

## 2022-01-07 NOTE — Telephone Encounter (Signed)
Spoke with the patient. She reports she is doing well. Linzess 145 mcg worked the best for her. She still uses Bentyl PRN. No new problems or concerns. Offered an appointment. Patient declines. Refills on Linzess 145 mcg given.
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
## 2022-01-07 NOTE — Patient Instructions (Signed)
Topiramate '200mg'$  at bedtime Rizatriptan as needed.  Limit use of pain relievers to no more than 2 days out of week to prevent risk of rebound or medication-overuse headache. Follow up 1 year

## 2022-01-07 NOTE — Telephone Encounter (Signed)
Inbound call from patient wanting a refill on medication linzess. Please advise.

## 2022-01-17 ENCOUNTER — Ambulatory Visit
Admission: RE | Admit: 2022-01-17 | Discharge: 2022-01-17 | Disposition: A | Discharge status: Home / Self Care | Payer: Medicare Other | Source: Ambulatory Visit | Attending: Student | Admitting: Student

## 2022-01-17 DIAGNOSIS — M7541 Impingement syndrome of right shoulder: Secondary | ICD-10-CM

## 2022-02-01 ENCOUNTER — Ambulatory Visit (INDEPENDENT_AMBULATORY_CARE_PROVIDER_SITE_OTHER): Payer: Medicare Other | Admitting: Physician Assistant

## 2022-02-01 ENCOUNTER — Encounter: Payer: Self-pay | Admitting: Physician Assistant

## 2022-02-01 VITALS — BP 112/62 | HR 95 | Temp 97.5°F | Ht 66.0 in | Wt 241.4 lb

## 2022-02-01 DIAGNOSIS — G2581 Restless legs syndrome: Secondary | ICD-10-CM

## 2022-02-01 DIAGNOSIS — E88819 Insulin resistance, unspecified: Secondary | ICD-10-CM | POA: Diagnosis not present

## 2022-02-01 DIAGNOSIS — M7989 Other specified soft tissue disorders: Secondary | ICD-10-CM

## 2022-02-01 DIAGNOSIS — F17210 Nicotine dependence, cigarettes, uncomplicated: Secondary | ICD-10-CM

## 2022-02-01 DIAGNOSIS — E669 Obesity, unspecified: Secondary | ICD-10-CM | POA: Diagnosis not present

## 2022-02-01 DIAGNOSIS — K219 Gastro-esophageal reflux disease without esophagitis: Secondary | ICD-10-CM

## 2022-02-01 DIAGNOSIS — Z0000 Encounter for general adult medical examination without abnormal findings: Secondary | ICD-10-CM | POA: Diagnosis not present

## 2022-02-01 LAB — LIPID PANEL
Cholesterol: 174 mg/dL (ref 0–200)
HDL: 49 mg/dL (ref >39.00)
LDL Cholesterol: 103 mg/dL — ABNORMAL HIGH (ref 0–99)
NonHDL: 125.07
Total CHOL/HDL Ratio: 4
Triglycerides: 108 mg/dL (ref 0.0–149.0)
VLDL: 21.6 mg/dL (ref 0.0–40.0)

## 2022-02-01 LAB — COMPREHENSIVE METABOLIC PANEL
ALT: 17 U/L (ref 0–35)
AST: 15 U/L (ref 0–37)
Albumin: 3.7 g/dL (ref 3.5–5.2)
Alkaline Phosphatase: 95 U/L (ref 39–117)
BUN: 8 mg/dL (ref 6–23)
CO2: 29 mEq/L (ref 19–32)
Calcium: 8.9 mg/dL (ref 8.4–10.5)
Chloride: 105 mEq/L (ref 96–112)
Creatinine, Ser: 0.84 mg/dL (ref 0.40–1.20)
GFR: 77.21 mL/min (ref >60.00)
Glucose, Bld: 140 mg/dL — ABNORMAL HIGH (ref 70–99)
Potassium: 3.8 mEq/L (ref 3.5–5.1)
Sodium: 140 mEq/L (ref 135–145)
Total Bilirubin: 0.2 mg/dL (ref 0.2–1.2)
Total Protein: 6.2 g/dL (ref 6.0–8.3)

## 2022-02-01 LAB — CBC WITH DIFFERENTIAL/PLATELET
Basophils Absolute: 0.1 10*3/uL (ref 0.0–0.1)
Basophils Relative: 0.9 % (ref 0.0–3.0)
Eosinophils Absolute: 0.4 10*3/uL (ref 0.0–0.7)
Eosinophils Relative: 6 % — ABNORMAL HIGH (ref 0.0–5.0)
HCT: 39.8 % (ref 36.0–46.0)
Hemoglobin: 13.5 g/dL (ref 12.0–15.0)
Lymphocytes Relative: 41.3 % (ref 12.0–46.0)
Lymphs Abs: 2.6 10*3/uL (ref 0.7–4.0)
MCHC: 34 g/dL (ref 30.0–36.0)
MCV: 98.5 fl (ref 78.0–100.0)
Monocytes Absolute: 0.6 10*3/uL (ref 0.1–1.0)
Monocytes Relative: 10.2 % (ref 3.0–12.0)
Neutro Abs: 2.6 10*3/uL (ref 1.4–7.7)
Neutrophils Relative %: 41.6 % — ABNORMAL LOW (ref 43.0–77.0)
Platelets: 202 10*3/uL (ref 150.0–400.0)
RBC: 4.04 Mil/uL (ref 3.87–5.11)
RDW: 13.2 % (ref 11.5–15.5)
WBC: 6.3 10*3/uL (ref 4.0–10.5)

## 2022-02-01 LAB — HEMOGLOBIN A1C: Hgb A1c MFr Bld: 6.3 % (ref 4.6–6.5)

## 2022-02-01 NOTE — Patient Instructions (Addendum)
It was great to see you!  Please call the [facility] at [place] -- or go through [facility] to schedule your mammogram (see handout)  Please go to the lab for blood work.   Our office will call you with your results unless you have chosen to receive results via MyChart.  If your blood work is normal we will follow-up each year for physicals and as scheduled for chronic medical problems.  If anything is abnormal we will treat accordingly and get you in for a follow-up.  Take care,  [name] White

## 2022-02-01 NOTE — Progress Notes (Signed)
Subjective:    [name] White is a 56 y.o. female and is here for a comprehensive physical exam.  HPI  There are no preventive care reminders to display for this patient.  Acute Concerns: She states that she is having right shoulder surgery in January 2024 with Dr. Dorien Chihuahua at [facility] sometime in January.  Chronic Issues: Obesity Patient was referred to a weight management program, but did not start because of financial reasons.  Insulin resistance Patient is skeptical and hesitant to be put on medication for blood sugar especially Metformin -- she heard that this medication is harmful. She is willing to re-start it if needed.  RLS Patient states that she is complaint with 2 mg Requip. Tolerates this week.  Feet swelling Patient reports that she is complaint with her 20 mg Lasix for her feet once a week.  GERD Patient is complaint with protonix 40 mg.   Health Maintenance: Colonoscopy -- last completed on 05/07/2021. Mammogram -- last completed on 01/05/2021. Overdue. PAP -- n/a Diet -- She does not maintain a fair diet. Exercise -- She does not participate in regular exercise.  Sleep habits -- Patient states that she does not get the best sleep. Mood -- She reports to that her mood is not the best. She continues to see psychiatry and they are looking to get her set up for EMDR.  UTD with dentist? - She is UTD on dental care. UTD with eye doctor? - She is UTD on vision care.  Weight history: Wt Readings from Last 10 Encounters:  02/01/22 241 lb 6.4 oz (109.5 kg)  01/07/22 241 lb (109.3 kg)  10/12/21 242 lb (109.8 kg)  10/05/21 242 lb (109.8 kg)  09/15/21 245 lb 12.8 oz (111.5 kg)  05/07/21 235 lb (106.6 kg)  01/19/21 238 lb (108 kg)  01/05/21 235 lb 8 oz (106.8 kg)  12/02/20 240 lb 3.2 oz (109 kg)  04/30/20 240 lb (108.9 kg)   Body mass index is 38.96 kg/m. No LMP recorded (lmp unknown). Patient has had a hysterectomy.  Alcohol use:  reports  current alcohol use of about 6.0 standard drinks of alcohol per week.  Tobacco use:  Tobacco Use: High Risk (02/01/2022)   Patient History    Smoking Tobacco Use: Every Day    Smokeless Tobacco Use: Never    Passive Exposure: Not on file   Eligible for lung cancer screening? Yes and is currently enrolled with pulm     10/05/2021    1:14 PM  Depression screen PHQ 2/9  Decreased Interest 1  Down, Depressed, Hopeless 2  PHQ - 2 Score 3  Altered sleeping 3  Tired, decreased energy 2  Change in appetite 1  Feeling bad or failure about yourself  2  Trouble concentrating 1  Moving slowly or fidgety/restless 0  Suicidal thoughts 0  PHQ-9 Score 12     Other providers/specialists: Patient Care Team: Inda Coke, [place] as PCP - General (Physician Assistant) Sueanne Margarita, MD as PCP - Cardiology (Cardiology) Lafayette Dragon, MD (Inactive) as Consulting Physician (Gastroenterology) Maisie Fus, MD (Inactive) as Consulting Physician (Obstetrics and Gynecology) Nicholaus Bloom, MD (Anesthesiology) Rod Can, MD as Consulting Physician (Orthopedic Surgery)    PMHx, SurgHx, SocialHx, Medications, and Allergies were reviewed in the Visit Navigator and updated as appropriate.   Past Medical History:  Diagnosis Date   Anterolisthesis    Anxiety    per pt, has panic attacks   Arthritis  in back per pt.   Arthritis of right hip    hips   Barrett's esophagus    egd 2007 + 2009 but none 2011 or 08/2010 egd   Bipolar disorder (HCC)    Chronic headache    Colon polyp    hyperplastic   Depression    hx of suicide attempt in 2003   GERD (gastroesophageal reflux disease)    Grief reaction    Hidradenitis suppurativa    IBS (irritable bowel syndrome)    Migraine    OSA (obstructive sleep apnea)    does not use cpap   Ovarian cyst    Restless leg    Tobacco abuse      Past Surgical History:  Procedure Laterality Date   ABDOMINAL HYSTERECTOMY     secondary to  endometriosis   BACK SURGERY Right 2019   lumbar ablation L3-S1 x 2    BIOPSY  05/07/2021   Procedure: BIOPSY;  Surgeon: Irving Copas., MD;  Location: [place];  Service: Gastroenterology;;   CESAREAN SECTION     x 2   CHOLECYSTECTOMY     COLONOSCOPY W/ POLYPECTOMY  2012   COLONOSCOPY WITH PROPOFOL N/A 05/01/2020   Procedure: COLONOSCOPY WITH PROPOFOL;  Surgeon: Irving Copas., MD;  Location: [place];  Service: Gastroenterology;  Laterality: N/A;   COLONOSCOPY WITH PROPOFOL N/A 05/07/2021   Procedure: COLONOSCOPY WITH PROPOFOL;  Surgeon: Rush Landmark Telford Nab., MD;  Location: [place];  Service: Gastroenterology;  Laterality: N/A;   ELBOW SURGERY     left   ENDOSCOPIC MUCOSAL RESECTION N/A 05/01/2020   Procedure: ENDOSCOPIC MUCOSAL RESECTION;  Surgeon: Rush Landmark Telford Nab., MD;  Location: [place];  Service: Gastroenterology;  Laterality: N/A;   ENDOSCOPIC MUCOSAL RESECTION N/A 05/07/2021   Procedure: ENDOSCOPIC MUCOSAL RESECTION;  Surgeon: Rush Landmark Telford Nab., MD;  Location: [place];  Service: Gastroenterology;  Laterality: N/A;   HEMOSTASIS CLIP PLACEMENT  05/01/2020   Procedure: HEMOSTASIS CLIP PLACEMENT;  Surgeon: Irving Copas., MD;  Location: [place];  Service: Gastroenterology;;   HEMOSTASIS CLIP PLACEMENT  05/07/2021   Procedure: HEMOSTASIS CLIP PLACEMENT;  Surgeon: Irving Copas., MD;  Location: [place];  Service: Gastroenterology;;   HOT HEMOSTASIS N/A 05/07/2021   Procedure: HOT HEMOSTASIS (ARGON PLASMA COAGULATION/BICAP);  Surgeon: Irving Copas., MD;  Location: [place];  Service: Gastroenterology;  Laterality: N/A;   LAPAROSCOPY  04-22-10   lysis of adhesions   LAPAROSCOPY  04-30-10    with mini laparotomy and cystotomy repair   plantar fascitis     right foot   POLYPECTOMY  05/01/2020   Procedure: POLYPECTOMY;  Surgeon: Mansouraty, Telford Nab., MD;  Location: [place];  Service:  Gastroenterology;;   POLYPECTOMY  05/07/2021   Procedure: POLYPECTOMY;  Surgeon: Irving Copas., MD;  Location: [place];  Service: Gastroenterology;;   SALPINGECTOMY  04-22-10   right   SUBMUCOSAL LIFTING INJECTION  05/01/2020   Procedure: SUBMUCOSAL LIFTING INJECTION;  Surgeon: Irving Copas., MD;  Location: [place];  Service: Gastroenterology;;   Maryagnes Amos INJECTION  05/07/2021   Procedure: SUBMUCOSAL LIFTING INJECTION;  Surgeon: Irving Copas., MD;  Location: [place];  Service: Gastroenterology;;   TOTAL HIP ARTHROPLASTY Right 04/25/2017   Procedure: RIGHT TOTAL HIP ARTHROPLASTY ANTERIOR APPROACH;  Surgeon: Rod Can, MD;  Location: [place];  Service: Orthopedics;  Laterality: Right;  Needs RNFA   TUBAL LIGATION     WRIST SURGERY     right/ganglion cyst     Family  History  Problem Relation Age of Onset   COPD Mother    Stroke Mother    Coronary artery disease Mother        pacer   Colon polyps Mother    Breast cancer Mother    Migraines Mother    Other Mother        RSDS   Rheum arthritis Mother    Hypertension Father    Colon polyps Brother    Colon cancer Maternal Grandfather    Colon polyps Maternal Grandfather    Breast cancer Maternal Aunt    Uterine cancer Cousin        x 2   Aneurysm Maternal Grandmother    Kidney disease Brother    Other Brother        back pain   Kidney cancer Paternal Aunt    Breast cancer Cousin    Stomach cancer Neg Hx    Pancreatic cancer Neg Hx    Esophageal cancer Neg Hx    Liver disease Neg Hx    Inflammatory bowel disease Neg Hx     Social History   Tobacco Use   Smoking status: Every Day    Packs/day: 1.00    Years: 40.00    Total pack years: 40.00    Types: Cigarettes    Start date: 10/16/1980   Smokeless tobacco: Never   Tobacco comments:    Has smoked off and on since the age of 89, quit while pregnant twice and also quit in 2017 but restarted in 2022  Vaping Use    Vaping Use: Never used  Substance Use Topics   Alcohol use: Yes    Alcohol/week: 6.0 standard drinks of alcohol    Types: 6 Standard drinks or equivalent per week    Comment: wine/liquor 2 x a month   Drug use: No    Review of Systems:   Review of Systems  Constitutional:  Negative for chills, fever, malaise/fatigue and weight loss.  HENT:  Negative for hearing loss, sinus pain and sore throat.   Respiratory:  Negative for cough and hemoptysis.   Cardiovascular:  Negative for chest pain, palpitations, leg swelling and PND.  Gastrointestinal:  Negative for abdominal pain, constipation, diarrhea, heartburn, nausea and vomiting.  Genitourinary:  Negative for dysuria, frequency and urgency.  Musculoskeletal:  Negative for back pain, myalgias and neck pain.  Skin:  Negative for itching and rash.  Endo/Heme/Allergies:  Negative for polydipsia.  Psychiatric/Behavioral:  Negative for depression. The patient is not nervous/anxious.     Objective:   BP 112/62 (BP Location: Left Arm, Patient Position: Sitting)   Pulse 95   Temp (!) 97.5 F (36.4 C) (Temporal)   Ht '5\' 6"'$  (1.676 m)   Wt 241 lb 6.4 oz (109.5 kg)   LMP  (LMP Unknown)   SpO2 94%   BMI 38.96 kg/m  Body mass index is 38.96 kg/m.   General Appearance:    Alert, cooperative, no distress, appears stated age  Head:    Normocephalic, without obvious abnormality, atraumatic  Eyes:    PERRL, conjunctiva/corneas clear, EOM's intact, fundi    benign, both eyes  Ears:    Normal TM's and external ear canals, both ears  Nose:   Nares normal, septum midline, mucosa normal, no drainage    or sinus tenderness  Throat:   Lips, mucosa, and tongue normal; teeth and gums normal  Neck:   Supple, symmetrical, trachea midline, no adenopathy;    thyroid:  no enlargement/tenderness/nodules;  no carotid   bruit or JVD  Back:     Symmetric, no curvature, ROM normal, no CVA tenderness  Lungs:     Clear to auscultation bilaterally, respirations  unlabored  Chest Wall:    No tenderness or deformity   Heart:    Regular rate and rhythm, S1 and S2 normal, no murmur, rub or gallop  Breast Exam:    Deferred  Abdomen:     Soft, non-tender, bowel sounds active all four quadrants,    no masses, no organomegaly  Genitalia:    Deferred  Extremities:   Extremities normal, atraumatic, no cyanosis or edema  Pulses:   2+ and symmetric all extremities  Skin:   Skin color, texture, turgor normal, no rashes or lesions  Lymph nodes:   Cervical, supraclavicular, and axillary nodes normal  Neurologic:   CNII-XII intact, normal strength, sensation and reflexes    throughout    Assessment/Plan:   Routine physical examination Today patient counseled on age appropriate routine health concerns for screening and prevention, each reviewed and up to date or declined. Immunizations reviewed and up to date or declined. Labs ordered and reviewed. Risk factors for depression reviewed and negative. Hearing function and visual acuity are intact. ADLs screened and addressed as needed. Functional ability and level of safety reviewed and appropriate. Education, counseling and referrals performed based on assessed risks today. Patient provided with a copy of personalized plan for preventive services.  Insulin resistance Update A1c and make recommendations accordingly Likely resume metformin Given hx of gastroparesis -- will try to avoid GLP-1 RA Follow-up based on results  Leg swelling Well controlled per patient Continue to monitor  Obesity, unspecified classification, unspecified obesity type, unspecified whether serious comorbidity present Likely resume metformin Given hx of gastroparesis -- will try to avoid GLP-1 RA Continue efforts at healthy lifestyle  Tobacco smoker, 1 pack of cigarettes or less per day Ongoing Has no desire to quit  RLS (restless legs syndrome) Well controlled Continue requip 2 mg daily Follow-up in 6 months, sooner if  concerns  Gastroesophageal reflux disease, unspecified whether esophagitis present Well controlled Continue protonix 40 mg daily  I,Verona Buck,acting as a Education administrator for [employer], PA.,have documented all relevant documentation on the behalf of Inda Coke, PA,as directed by  Inda Coke, PA while in the presence of Inda Coke, [place].  I, Inda Coke, [place], have reviewed all documentation for this visit. The documentation on 02/01/22 for the exam, diagnosis, procedures, and orders are all accurate and complete.  Inda Coke, PA-C [place]

## 2022-02-02 ENCOUNTER — Other Ambulatory Visit: Payer: Self-pay | Admitting: Physician Assistant

## 2022-02-02 ENCOUNTER — Encounter: Payer: Self-pay | Admitting: Internal Medicine

## 2022-02-02 ENCOUNTER — Encounter: Payer: Self-pay | Admitting: Physician Assistant

## 2022-02-02 MED ORDER — PRAVASTATIN SODIUM 40 MG PO TABS: 40.0000 mg | ORAL_TABLET | Freq: Every day | ORAL | 1 refills | Status: DC

## 2022-02-02 MED ORDER — METFORMIN HCL ER 500 MG PO TB24: 500.0000 mg | ORAL_TABLET | Freq: Every day | ORAL | 1 refills | Status: DC

## 2022-02-02 NOTE — Telephone Encounter (Signed)
I spoke to patient and scheduled her hst.  Nothing further needed.  Will route to triage to close out [name] White.

## 2022-02-04 ENCOUNTER — Other Ambulatory Visit (HOSPITAL_COMMUNITY): Payer: Self-pay | Admitting: Psychiatry

## 2022-02-04 ENCOUNTER — Ambulatory Visit: Payer: Medicare Other

## 2022-02-04 DIAGNOSIS — G4733 Obstructive sleep apnea (adult) (pediatric): Secondary | ICD-10-CM

## 2022-02-04 DIAGNOSIS — F319 Bipolar disorder, unspecified: Secondary | ICD-10-CM

## 2022-02-04 DIAGNOSIS — F4310 Post-traumatic stress disorder, unspecified: Secondary | ICD-10-CM

## 2022-02-05 ENCOUNTER — Other Ambulatory Visit: Payer: Self-pay | Admitting: Physician Assistant

## 2022-02-05 DIAGNOSIS — G4733 Obstructive sleep apnea (adult) (pediatric): Secondary | ICD-10-CM | POA: Diagnosis not present

## 2022-02-05 DIAGNOSIS — Z1231 Encounter for screening mammogram for malignant neoplasm of breast: Secondary | ICD-10-CM

## 2022-02-08 ENCOUNTER — Other Ambulatory Visit (HOSPITAL_COMMUNITY): Payer: Self-pay | Admitting: *Deleted

## 2022-02-08 ENCOUNTER — Encounter: Payer: Self-pay | Admitting: Neurology

## 2022-02-08 ENCOUNTER — Other Ambulatory Visit: Payer: Self-pay

## 2022-02-08 DIAGNOSIS — F4310 Post-traumatic stress disorder, unspecified: Secondary | ICD-10-CM

## 2022-02-08 DIAGNOSIS — F319 Bipolar disorder, unspecified: Secondary | ICD-10-CM

## 2022-02-08 MED ORDER — RIZATRIPTAN BENZOATE 10 MG PO TBDP: 10.0000 mg | ORAL_TABLET | ORAL | 11 refills | Status: DC | PRN

## 2022-02-08 MED ORDER — RISPERIDONE 1 MG PO TABS: 1.0000 mg | ORAL_TABLET | Freq: Every day | ORAL | 0 refills | Status: DC

## 2022-02-08 NOTE — Telephone Encounter (Signed)
[facility] Mychart message, Dr. Tomi Likens Can you please send my Rizatriptan prescription to [facility] on [address] for me. It's $47 at [facility] and only $12 at [facility]. I would greatly appreciate it. Please make [facility] my preferred Pharmacy. Thank you!! [name] White Rizatriptan sent to [facility].

## 2022-02-09 ENCOUNTER — Telehealth: Payer: Self-pay | Admitting: Internal Medicine

## 2022-02-09 NOTE — Telephone Encounter (Signed)
Home sleep test showed mild obstructive sleep apnea averaging 13-14 apneas/ hour.  She didn't feel a lot of benefit from CPAP in the past. We can try again with that. Another option might be referral to Dr Augustina Mood, DDS to learn about oral appliances for sleep apnea as alternative to CPAP. She can wait til her next appt with me to discuss in more detail if she would rather.

## 2022-02-09 NOTE — Telephone Encounter (Signed)
Called and spoke to patient and she states that she is wanting her Home Sleep Study results.   Sleep study was just uploaded yesterday  Please advise sir

## 2022-02-10 NOTE — Telephone Encounter (Signed)
Called and left voicemail for patient to let her know her options given her home sleep test results. Nothing further needed

## 2022-02-19 ENCOUNTER — Other Ambulatory Visit: Payer: Self-pay | Admitting: Physician Assistant

## 2022-02-19 ENCOUNTER — Other Ambulatory Visit: Payer: Self-pay | Admitting: Surgery

## 2022-02-23 ENCOUNTER — Telehealth: Payer: PPO | Admitting: Family Medicine

## 2022-02-23 DIAGNOSIS — B9689 Other specified bacterial agents as the cause of diseases classified elsewhere: Secondary | ICD-10-CM

## 2022-02-23 DIAGNOSIS — J208 Acute bronchitis due to other specified organisms: Secondary | ICD-10-CM

## 2022-02-23 MED ORDER — PSEUDOEPH-BROMPHEN-DM 30-2-10 MG/5ML PO SYRP: 5.0000 mL | ORAL_SOLUTION | Freq: Three times a day (TID) | ORAL | 0 refills | Status: DC | PRN

## 2022-02-23 MED ORDER — DOXYCYCLINE HYCLATE 100 MG PO TABS: 100.0000 mg | ORAL_TABLET | Freq: Two times a day (BID) | ORAL | 0 refills | Status: AC

## 2022-02-23 NOTE — Progress Notes (Signed)
Virtual Visit Consent   [name] White, you are scheduled for a virtual visit with a [facility] provider today. Just as with appointments in the office, your consent must be obtained to participate. Your consent will be active for this visit and any virtual visit you may have with one of our providers in the next 365 days. If you have a MyChart account, a copy of this consent can be sent to you electronically.  As this is a virtual visit, video technology does not allow for your provider to perform a traditional examination. This may limit your provider's ability to fully assess your condition. If your provider identifies any concerns that need to be evaluated in person or the need to arrange testing (such as labs, EKG, etc.), we will make arrangements to do so. Although advances in technology are sophisticated, we cannot ensure that it will always work on either your end or our end. If the connection with a video visit is poor, the visit may have to be switched to a telephone visit. With either a video or telephone visit, we are not always able to ensure that we have a secure connection.  By engaging in this virtual visit, you consent to the provision of healthcare and authorize for your insurance to be billed (if applicable) for the services provided during this visit. Depending on your insurance coverage, you may receive a charge related to this service.  I need to obtain your verbal consent now. Are you willing to proceed with your visit today? [name] White has provided verbal consent on 02/23/2022 for a virtual visit (video or telephone). Perlie Mayo, NP  Date: 02/23/2022 2:55 PM  Virtual Visit via Video Note   I, Perlie Mayo, connected with  [name] White  ([id number], 03-05-1965) on 02/23/22 at  3:00 PM EST by a video-enabled telemedicine application and verified that I am speaking with the correct person using two identifiers.  Location: Patient: Virtual Visit Location Patient:  Home Provider: Virtual Visit Location Provider: Home Office   I discussed the limitations of evaluation and management by telemedicine and the availability of in person appointments. The patient expressed understanding and agreed to proceed.    History of Present Illness: [name] White is a 57 y.o. who identifies as a female who was assigned female at birth, and is being seen today for dry cough- that started two weeks ago.  Associated symptoms include dry cough with what sounds wet- but reports she can not get anything up. When she can get some up it turned green- rare to get anything up. Having trouble catching breath after cough episode. Has some shortness of breath at times. Fever at times to 100-101 but has returned to normal. History of smoking- quit 02/15/2022. Cough med OTC- Tussin DM and severe cold and cough-equate and without any relief Denies fevers, chills, chest pain   Problems:  Patient Active Problem List   Diagnosis Date Noted   Tobacco abuse    Dry mouth    Cecal polyp 04/04/2020   Abnormal colonoscopy 04/04/2020   Family history of colonic polyps 04/04/2020   Irritable bowel syndrome with both constipation and diarrhea 02/28/2019   PTSD (post-traumatic stress disorder) 02/28/2019   Bipolar 1 disorder ([place]) 02/28/2019   Insulin resistance 04/23/2018   Morbid obesity ([place]), BMI > 35 with comorbid conditions 04/23/2018   Chronic bilateral low back pain 12/07/2017   Chronic constipation 12/02/2017   Primary osteoarthritis of right hip 04/25/2017  Gastroparesis 02/15/2017   Osteoarthritis 09/24/2016   Chronic pansinusitis 08/26/2016   Ilioinguinal neuralgia of right side 03/11/2015   Ilioinguinal neuralgia 03/11/2015   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   Chronic pelvic pain in female 07/06/2012   Cystocele 07/06/2012   Rectocele 07/06/2012   Hypersomnia 01/30/2012   Obstructive sleep apnea syndrome    Depressive disorder    Right lumbosacral  radiculopathy 07/10/2010   Dyslipidemia 05/05/2009   Chronic headache disorder 09/28/2007    Allergies:  Allergies  Allergen Reactions   Morphine Hives and Nausea And Vomiting    INTOLERANCE >  N & V   Penicillins Hives, Itching and Other (See Comments)     PATIENT HAS HAD A PCN REACTION WITH IMMEDIATE RASH, FACIAL/TONGUE/THROAT SWELLING, SOB, OR LIGHTHEADEDNESS WITH HYPOTENSION:  #  #  #  YES  #  #  #   Has patient had a PCN reaction causing severe rash involving mucus membranes or skin necrosis: Unknown Has patient had a PCN reaction that required hospitalization: No Has patient had a PCN reaction occurring within the last 10 years: No If all of the above answers are "NO", then may proceed with Cephalosporin use.    Clarithromycin Nausea And Vomiting   Medications:  Current Outpatient Medications:    acetaminophen (TYLENOL) 500 MG tablet, Take 2,000 mg by mouth daily as needed for moderate pain., Disp: , Rfl:    dicyclomine (BENTYL) 10 MG capsule, TAKE 1 CAPSULE (10 MG TOTAL) BY MOUTH 4 (FOUR) TIMES DAILY - BEFORE MEALS AND AT BEDTIME. (Patient taking differently: Take 10 mg by mouth 2 (two) times daily.), Disp: 360 capsule, Rfl: 0   DULoxetine (CYMBALTA) 60 MG capsule, Take 1 capsule (60 mg total) by mouth at bedtime., Disp: 90 capsule, Rfl: 0   furosemide (LASIX) 20 MG tablet, TAKE 1 TABLET BY MOUTH DAILY AS NEEDED FOR FLUID., Disp: 90 tablet, Rfl: 0   HYDROmorphone (DILAUDID) 2 MG tablet, Take 2 mg by mouth 5 (five) times daily., Disp: , Rfl:    lamoTRIgine (LAMICTAL) 200 MG tablet, Take 1 tablet (200 mg total) by mouth at bedtime., Disp: 90 tablet, Rfl: 0   linaclotide (LINZESS) 145 MCG CAPS capsule, Take 1 capsule (145 mcg total) by mouth daily before breakfast., Disp: 30 capsule, Rfl: 6   metFORMIN (GLUCOPHAGE-XR) 500 MG 24 hr tablet, Take 1 tablet (500 mg total) by mouth daily with breakfast., Disp: 90 tablet, Rfl: 1   pantoprazole (PROTONIX) 40 MG tablet, TAKE 1 TABLET BY MOUTH  EVERY DAY, Disp: 90 tablet, Rfl: 1   pravastatin (PRAVACHOL) 40 MG tablet, Take 1 tablet (40 mg total) by mouth daily., Disp: 90 tablet, Rfl: 1   risperiDONE (RISPERDAL) 1 MG tablet, Take 1 tablet (1 mg total) by mouth at bedtime., Disp: 30 tablet, Rfl: 0   rizatriptan (MAXALT-MLT) 10 MG disintegrating tablet, Take 1 tablet (10 mg total) by mouth as needed for migraine. May repeat in 2 hours if needed.  Maximum 2 tablets in 24 hours, Disp: 12 tablet, Rfl: 11   rOPINIRole (REQUIP) 1 MG tablet, TAKE 2 TABLETS (2 MG TOTAL) BY MOUTH AT BEDTIME., Disp: 180 tablet, Rfl: 1   topiramate (TOPAMAX) 200 MG tablet, TAKE 1 TABLET BY MOUTH EVERYDAY AT BEDTIME, Disp: 90 tablet, Rfl: 3   traZODone (DESYREL) 100 MG tablet, Take 2 tablets (200 mg total) by mouth at bedtime as needed for sleep., Disp: 60 tablet, Rfl: 1  Observations/Objective: Patient is well-developed, well-nourished in no acute distress.  Resting comfortably  at home.  Head is normocephalic, atraumatic.  No labored breathing.  Speech is clear and coherent with logical content.  Patient is alert and oriented at baseline.  Cough appreciated   Assessment and Plan:  1. Acute bacterial bronchitis  - doxycycline (VIBRA-TABS) 100 MG tablet; Take 1 tablet (100 mg total) by mouth 2 (two) times daily for 10 days.  Dispense: 20 tablet; Refill: 0 - brompheniramine-pseudoephedrine-DM 30-2-10 MG/5ML syrup; Take 5 mLs by mouth 3 (three) times daily as needed.  Dispense: 120 mL; Refill: 0   - Take meds as prescribed - Rest voice - Use a cool mist humidifier especially during the winter months when heat dries out the air. - Use saline nose sprays frequently to help soothe nasal passages if they are drying out. - Stay hydrated by drinking plenty of fluids - Keep thermostat turn down low to prevent drying out which can cause a dry cough. - For any cough or congestion- robitussin DM or Delsym as needed - For fever or aches or pains- take tylenol or  ibuprofen as directed on bottle             * for fevers greater than 101 orally you may alternate ibuprofen and tylenol every 3 hours.  If you do not improve you will need a follow up visit in person.                Reviewed side effects, risks and benefits of medication.    Patient acknowledged agreement and understanding of the plan.   Past Medical, Surgical, Social History, Allergies, and Medications have been Reviewed.    Follow Up Instructions: I discussed the assessment and treatment plan with the patient. The patient was provided an opportunity to ask questions and all were answered. The patient agreed with the plan and demonstrated an understanding of the instructions.  A copy of instructions were sent to the patient via MyChart unless otherwise noted below.     The patient was advised to call back or seek an in-person evaluation if the symptoms worsen or if the condition fails to improve as anticipated.  Time:  I spent 10 minutes with the patient via telehealth technology discussing the above problems/concerns.    Perlie Mayo, NP

## 2022-02-23 NOTE — Patient Instructions (Signed)
Caren Griffins D Issa, thank you for joining Perlie Mayo, NP for today's virtual visit.  While this provider is not your primary care provider (PCP), if your PCP is located in our provider database this encounter information will be shared with them immediately following your visit.   [facility] account gives you access to today's visit and all your visits, tests, and labs performed at [facility] " click here if you don't have a [facility] account or go to [email or web address]  Consent: (Patient) [name] White provided verbal consent for this virtual visit at the beginning of the encounter.  Current Medications:  Current Outpatient Medications:    brompheniramine-pseudoephedrine-DM 30-2-10 MG/5ML syrup, Take 5 mLs by mouth 3 (three) times daily as needed., Disp: 120 mL, Rfl: 0   doxycycline (VIBRA-TABS) 100 MG tablet, Take 1 tablet (100 mg total) by mouth 2 (two) times daily for 10 days., Disp: 20 tablet, Rfl: 0   acetaminophen (TYLENOL) 500 MG tablet, Take 2,000 mg by mouth daily as needed for moderate pain., Disp: , Rfl:    dicyclomine (BENTYL) 10 MG capsule, TAKE 1 CAPSULE (10 MG TOTAL) BY MOUTH 4 (FOUR) TIMES DAILY - BEFORE MEALS AND AT BEDTIME. (Patient taking differently: Take 10 mg by mouth 2 (two) times daily.), Disp: 360 capsule, Rfl: 0   DULoxetine (CYMBALTA) 60 MG capsule, Take 1 capsule (60 mg total) by mouth at bedtime., Disp: 90 capsule, Rfl: 0   furosemide (LASIX) 20 MG tablet, TAKE 1 TABLET BY MOUTH DAILY AS NEEDED FOR FLUID., Disp: 90 tablet, Rfl: 0   HYDROmorphone (DILAUDID) 2 MG tablet, Take 2 mg by mouth 5 (five) times daily., Disp: , Rfl:    lamoTRIgine (LAMICTAL) 200 MG tablet, Take 1 tablet (200 mg total) by mouth at bedtime., Disp: 90 tablet, Rfl: 0   linaclotide (LINZESS) 145 MCG CAPS capsule, Take 1 capsule (145 mcg total) by mouth daily before breakfast., Disp: 30 capsule, Rfl: 6   metFORMIN (GLUCOPHAGE-XR) 500 MG 24 hr tablet, Take 1  tablet (500 mg total) by mouth daily with breakfast., Disp: 90 tablet, Rfl: 1   pantoprazole (PROTONIX) 40 MG tablet, TAKE 1 TABLET BY MOUTH EVERY DAY, Disp: 90 tablet, Rfl: 1   pravastatin (PRAVACHOL) 40 MG tablet, Take 1 tablet (40 mg total) by mouth daily., Disp: 90 tablet, Rfl: 1   risperiDONE (RISPERDAL) 1 MG tablet, Take 1 tablet (1 mg total) by mouth at bedtime., Disp: 30 tablet, Rfl: 0   rizatriptan (MAXALT-MLT) 10 MG disintegrating tablet, Take 1 tablet (10 mg total) by mouth as needed for migraine. May repeat in 2 hours if needed.  Maximum 2 tablets in 24 hours, Disp: 12 tablet, Rfl: 11   rOPINIRole (REQUIP) 1 MG tablet, TAKE 2 TABLETS (2 MG TOTAL) BY MOUTH AT BEDTIME., Disp: 180 tablet, Rfl: 1   topiramate (TOPAMAX) 200 MG tablet, TAKE 1 TABLET BY MOUTH EVERYDAY AT BEDTIME, Disp: 90 tablet, Rfl: 3   traZODone (DESYREL) 100 MG tablet, Take 2 tablets (200 mg total) by mouth at bedtime as needed for sleep., Disp: 60 tablet, Rfl: 1   Medications ordered in this encounter:  Meds ordered this encounter  Medications   doxycycline (VIBRA-TABS) 100 MG tablet    Sig: Take 1 tablet (100 mg total) by mouth 2 (two) times daily for 10 days.    Dispense:  20 tablet    Refill:  0    Order Specific Question:   Supervising Provider    Answer:  LAMPTEY, PHILIP O [[id number]]   brompheniramine-pseudoephedrine-DM 30-2-10 MG/5ML syrup    Sig: Take 5 mLs by mouth 3 (three) times daily as needed.    Dispense:  120 mL    Refill:  0    Order Specific Question:   Supervising Provider    Answer:   Chase Picket [[id number]]     *If you need refills on other medications prior to your next appointment, please contact your pharmacy*  Follow-Up: Call back or seek an in-person evaluation if the symptoms worsen or if the condition fails to improve as anticipated.  [facility] [phone]  Other Instructions - Take meds as prescribed - Rest voice - Use a cool mist humidifier especially  during the winter months when heat dries out the air. - Use saline nose sprays frequently to help soothe nasal passages if they are drying out. - Stay hydrated by drinking plenty of fluids - Keep thermostat turn down low to prevent drying out which can cause a dry cough. - For any cough or congestion- robitussin DM or Delsym as needed - For fever or aches or pains- take tylenol or ibuprofen as directed on bottle             * for fevers greater than 101 orally you may alternate ibuprofen and tylenol every 3 hours.  If you do not improve you will need a follow up visit in person.                   If you have been instructed to have an in-person evaluation today at a local Urgent Care facility, please use the link below. It will take you to a list of all of our available [facility], including address, phone number and hours of operation. Please do not delay care.  [facility]  If you or a family member do not have a primary care provider, use the link below to schedule a visit and establish care. When you choose a [facility] primary care physician or advanced practice provider, you gain a long-term partner in health. Find a Primary Care Provider  Learn more about 's in-office and virtual care options: [facility]

## 2022-02-24 DIAGNOSIS — M7061 Trochanteric bursitis, right hip: Secondary | ICD-10-CM | POA: Diagnosis not present

## 2022-02-24 DIAGNOSIS — M5416 Radiculopathy, lumbar region: Secondary | ICD-10-CM | POA: Diagnosis not present

## 2022-02-24 DIAGNOSIS — G894 Chronic pain syndrome: Secondary | ICD-10-CM | POA: Diagnosis not present

## 2022-02-24 DIAGNOSIS — M47816 Spondylosis without myelopathy or radiculopathy, lumbar region: Secondary | ICD-10-CM | POA: Diagnosis not present

## 2022-02-24 DIAGNOSIS — Z79891 Long term (current) use of opiate analgesic: Secondary | ICD-10-CM | POA: Diagnosis not present

## 2022-02-25 ENCOUNTER — Encounter: Payer: Self-pay | Admitting: Physician Assistant

## 2022-02-26 ENCOUNTER — Other Ambulatory Visit: Payer: Self-pay | Admitting: Physician Assistant

## 2022-02-26 MED ORDER — WEGOVY 0.25 MG/0.5ML ~~LOC~~ SOAJ: 0.2500 mg | SUBCUTANEOUS | 0 refills | Status: DC

## 2022-03-01 ENCOUNTER — Encounter: Payer: Self-pay | Admitting: Family Medicine

## 2022-03-01 DIAGNOSIS — R051 Acute cough: Secondary | ICD-10-CM

## 2022-03-01 MED ORDER — BENZONATATE 100 MG PO CAPS: 100.0000 mg | ORAL_CAPSULE | Freq: Three times a day (TID) | ORAL | 0 refills | Status: DC | PRN

## 2022-03-02 ENCOUNTER — Other Ambulatory Visit: Payer: Self-pay

## 2022-03-02 ENCOUNTER — Emergency Department (HOSPITAL_COMMUNITY): Payer: PPO

## 2022-03-02 ENCOUNTER — Encounter (HOSPITAL_COMMUNITY): Payer: Self-pay | Admitting: Emergency Medicine

## 2022-03-02 ENCOUNTER — Emergency Department (HOSPITAL_COMMUNITY)
Admission: EM | Admit: 2022-03-02 | Discharge: 2022-03-03 | Discharge status: Against Medical Advice | Payer: PPO | Attending: Emergency Medicine | Admitting: Emergency Medicine

## 2022-03-02 DIAGNOSIS — R079 Chest pain, unspecified: Secondary | ICD-10-CM | POA: Diagnosis not present

## 2022-03-02 DIAGNOSIS — Z5321 Procedure and treatment not carried out due to patient leaving prior to being seen by health care provider: Secondary | ICD-10-CM | POA: Insufficient documentation

## 2022-03-02 DIAGNOSIS — I499 Cardiac arrhythmia, unspecified: Secondary | ICD-10-CM | POA: Diagnosis not present

## 2022-03-02 DIAGNOSIS — I959 Hypotension, unspecified: Secondary | ICD-10-CM | POA: Diagnosis not present

## 2022-03-02 DIAGNOSIS — R0789 Other chest pain: Secondary | ICD-10-CM | POA: Diagnosis not present

## 2022-03-02 LAB — CBC
HCT: 38.5 % (ref 36.0–46.0)
Hemoglobin: 12.7 g/dL (ref 12.0–15.0)
MCH: 32.4 pg (ref 26.0–34.0)
MCHC: 33 g/dL (ref 30.0–36.0)
MCV: 98.2 fL (ref 80.0–100.0)
Platelets: 191 10*3/uL (ref 150–400)
RBC: 3.92 MIL/uL (ref 3.87–5.11)
RDW: 12.3 % (ref 11.5–15.5)
WBC: 7.8 10*3/uL (ref 4.0–10.5)
nRBC: 0 % (ref 0.0–0.2)

## 2022-03-02 LAB — BASIC METABOLIC PANEL
Anion gap: 11 (ref 5–15)
BUN: 18 mg/dL (ref 6–20)
CO2: 22 mmol/L (ref 22–32)
Calcium: 9 mg/dL (ref 8.9–10.3)
Chloride: 104 mmol/L (ref 98–111)
Creatinine, Ser: 1.32 mg/dL — ABNORMAL HIGH (ref 0.44–1.00)
GFR, Estimated: 47 mL/min — ABNORMAL LOW (ref >60)
Glucose, Bld: 134 mg/dL — ABNORMAL HIGH (ref 70–99)
Potassium: 3 mmol/L — ABNORMAL LOW (ref 3.5–5.1)
Sodium: 137 mmol/L (ref 135–145)

## 2022-03-02 LAB — TROPONIN I (HIGH SENSITIVITY): Troponin I (High Sensitivity): 3 ng/L (ref <18)

## 2022-03-02 NOTE — ED Triage Notes (Signed)
EMS report: pt c/o CP, sharp, worsening with movement, associated with SOB at 6:30 pm. 324 ASA, 4 zofran, and NTG x1. Pt endorses improvement on CP following NTG from 10 to 5/10. 20 G RAC.

## 2022-03-02 NOTE — ED Triage Notes (Signed)
Pt arrives to ED via [facility] c/o centralized chest pain, described as sharp/heavy, after walking from car into the house. CP associated with SOB, nausea, and diaphoreses.  Denies cardiac hx.

## 2022-03-02 NOTE — ED Provider Triage Note (Signed)
Emergency Medicine Provider Triage Evaluation Note  [name] White , a 57 y.o. female  was evaluated in triage.  Pt complains of sudden onset of chest pain around 5pm.  Pt reports relief after asa and nitro by ems  Physical Exam  BP 115/72   Pulse 81   Temp 99.4 F (37.4 C) (Oral)   Resp 16   Ht '5\' 6"'$  (1.676 m)   Wt 105.2 kg   LMP  (LMP Unknown)   SpO2 97%   BMI 37.45 kg/m  Gen:   Awake, no distress   Resp:  Normal effort  MSK:   Moves extremities without difficulty  Other:    Medical Decision Making  Medically screening exam initiated at 8:44 PM.  Appropriate orders placed.  [name] White was informed that the remainder of the evaluation will be completed by another provider, this initial triage assessment does not replace that evaluation, and the importance of remaining in the ED until their evaluation is complete.     Fransico Meadow, [place] 03/02/22 2044

## 2022-03-03 ENCOUNTER — Encounter (HOSPITAL_BASED_OUTPATIENT_CLINIC_OR_DEPARTMENT_OTHER): Payer: Self-pay | Admitting: Cardiology

## 2022-03-03 ENCOUNTER — Telehealth: Payer: Self-pay | Admitting: Cardiology

## 2022-03-03 ENCOUNTER — Telehealth: Payer: Self-pay

## 2022-03-03 LAB — TROPONIN I (HIGH SENSITIVITY): Troponin I (High Sensitivity): 3 ng/L (ref <18)

## 2022-03-03 NOTE — Telephone Encounter (Signed)
Patient transferred to triage for chest pain. Quit smoking on 12/28. Virus at same time. Treated w antibiotic that she is still taking. (Virtual visit)  Cough started 3 days after quit smoking.  Non productive.  SOB with any activity, not new.   Last night couldn't breath at all.  Gasping for air.  Had mid/left chest pain.  Hurt to press on it last night.  Did not radiate at all.  Last night it hurt to take a deep breath.  Still coughing.  Went to [facility] ER by EMS.  Stayed for troponin x 2 which were normal and CXR.  EKG was NSR. Coronary CTA 01/2021 showed calcium score of 1.I adv her to reach out to PCP in regard to her symptoms.    She is not feeling any pain right now.  She is scheduled for APP visit next week.  She wants to know if she is still okay to have her shoulder surgery later this month.

## 2022-03-03 NOTE — ED Notes (Signed)
Pt stated they were not waiting until 0600 to be seen and that she would follow up with her cardiologist. Pt's IV was removed by staff and pt left.

## 2022-03-03 NOTE — Telephone Encounter (Signed)
Call to patient to advise of Dr. Theodosia Blender advice regarding recent ED treatment for chest pain and patient's request for nitro script.  Patient denies chest pain or SOB at this time, speaks in full sentences and states she already called in and got an appointment with PA Nicholes Rough on 03/09/22 but still has no script for Nitro.Per Dr. Radford Pax patient needs to contact PCP for nitro script as she has not been seen by Dr. Radford Pax since November 2022 and had a normal CTA at that time. Patient verbalizes understanding. Also discussed when to go to ED with patient, such as when severe chest pain is accompanied by SOB and does not get better with rest. Patient again verbalized understanding.

## 2022-03-03 NOTE — Telephone Encounter (Signed)
Pt c/o of Chest Pain: STAT if CP now or developed within 24 hours  1. Are you having CP right now? Had a lot of pressure on her chest last night- hurting a little today- went to the ER last night by EMS- stayed for71?2 hrs- never saw the doctor left  2. Are you experiencing any other symptoms (ex. SOB, nausea, vomiting, sweating)? Nauseated, burning up and cold sweats and short of breath  3. How long have you been experiencing CP? About a week felt like a pinching in her chest- last night .first time feeling the pressure in her chest  4. Is your CP continuous or coming and going? Constantly last night  5. Have you taken Nitroglycerin? Yes- patient wanted to be seen I offered her appointments for Friday and Monday, unable to come, because of other appointments  ?

## 2022-03-04 ENCOUNTER — Other Ambulatory Visit: Payer: Self-pay | Admitting: Physician Assistant

## 2022-03-04 ENCOUNTER — Encounter: Payer: Self-pay | Admitting: Physician Assistant

## 2022-03-04 MED ORDER — NITROGLYCERIN 0.4 MG SL SUBL: 0.4000 mg | SUBLINGUAL_TABLET | SUBLINGUAL | 3 refills | Status: AC | PRN

## 2022-03-05 DIAGNOSIS — G8929 Other chronic pain: Secondary | ICD-10-CM | POA: Diagnosis not present

## 2022-03-05 DIAGNOSIS — M7541 Impingement syndrome of right shoulder: Secondary | ICD-10-CM | POA: Diagnosis not present

## 2022-03-05 DIAGNOSIS — M25511 Pain in right shoulder: Secondary | ICD-10-CM | POA: Diagnosis not present

## 2022-03-05 DIAGNOSIS — M7521 Bicipital tendinitis, right shoulder: Secondary | ICD-10-CM | POA: Diagnosis not present

## 2022-03-05 DIAGNOSIS — M75121 Complete rotator cuff tear or rupture of right shoulder, not specified as traumatic: Secondary | ICD-10-CM | POA: Diagnosis not present

## 2022-03-05 DIAGNOSIS — Z6841 Body Mass Index (BMI) 40.0 and over, adult: Secondary | ICD-10-CM | POA: Diagnosis not present

## 2022-03-05 DIAGNOSIS — M7581 Other shoulder lesions, right shoulder: Secondary | ICD-10-CM | POA: Diagnosis not present

## 2022-03-08 ENCOUNTER — Inpatient Hospital Stay: Admission: RE | Admit: 2022-03-08 | Payer: Medicare Other | Source: Ambulatory Visit

## 2022-03-08 NOTE — Progress Notes (Addendum)
Office Visit    Patient Name: [name] White Date of Encounter: 03/09/2022  PCP:  Inda Coke, [place]  Cardiologist:  Fransico Him, MD  Advanced Practice Provider:  No care team member to display Electrophysiologist:  None   {  HPI    [name] White is a 57 y.o. female with a past medical history significant for obesity, anxiety with panic attacks, Barrett's esophagus, bipolar, depression, GERD and OSA (not using CPAP) who was referred to back in 2022 for lower extremity edema presents today for follow-up appointment and preop evaluation?  She had been having lower extremity edema for over a year at the time she saw Dr. Radford Pax  She does use salt when cooking but not at the table.  She does not sausage but no salty snacks.  Has OSA but did not tolerate PAP device with severe dry mouth.  She endorsed a few episodes of chest discomfort that was nonexertional and was described as pressure mid sternum with no radiation.  She says that she will break out in a sweat with the discomfort.  The pressure lasts a few minutes and then resolves on its own.  She has noticed problems with DOE for months.  It occurs when exerting and eating.  Today, she tells me she has some SOB. She had a recent ED visit earlier this month where she had chest pain, SOB, and palpitations. EMS gave her nitro and aspirin and her symptoms got better. In the ED, she got an EKG which was unremarkable and troponin which was normal. She states the pain came on in the evening while she was walking. She does have a history of panic attacks with her anxiety and sometimes has chest pain with those. She took a nitro the day after the event but has not needed to take anymore nitro since then. She is having surgery on her shoulder next week and wants to make sure she is okay to have the surgery. She does not have an official request, nor so we have a fax number for her surgeons office. I would really  like to get an echo since she is having symptoms and had a recent ED visit prior to any surgeries. Per the patient, surgeon said she was "okay" to have it done.  Her mobility and activity is limited due to hip and back issues but she did score a 5.87 mets on the DASI which exceed the minimum 4 mets requirement. She is not on any blood thinners. Consider adding '81mg'$  of ASA after surgery for her history of nonobstructive CAD.   No edema, orthopnea, PND.  Past Medical History    Past Medical History:  Diagnosis Date   Anterolisthesis    Anxiety    per pt, has panic attacks   Arthritis    in back per pt.   Arthritis of right hip    hips   Barrett's esophagus    egd 2007 + 2009 but none 2011 or 08/2010 egd   Bipolar disorder ([place])    Chronic headache    Colon polyp    hyperplastic   Depression    hx of suicide attempt in 2003   GERD (gastroesophageal reflux disease)    Grief reaction    Hidradenitis suppurativa    IBS (irritable bowel syndrome)    Migraine    OSA (obstructive sleep apnea)    does not use cpap   Ovarian cyst  Restless leg    Tobacco abuse    Past Surgical History:  Procedure Laterality Date   ABDOMINAL HYSTERECTOMY     secondary to endometriosis   BACK SURGERY Right 2019   lumbar ablation L3-S1 x 2    BIOPSY  05/07/2021   Procedure: BIOPSY;  Surgeon: Rush Landmark Telford Nab., MD;  Location: [place];  Service: Gastroenterology;;   CESAREAN SECTION     x 2   CHOLECYSTECTOMY     COLONOSCOPY W/ POLYPECTOMY  2012   COLONOSCOPY WITH PROPOFOL N/A 05/01/2020   Procedure: COLONOSCOPY WITH PROPOFOL;  Surgeon: Irving Copas., MD;  Location: [place];  Service: Gastroenterology;  Laterality: N/A;   COLONOSCOPY WITH PROPOFOL N/A 05/07/2021   Procedure: COLONOSCOPY WITH PROPOFOL;  Surgeon: Rush Landmark Telford Nab., MD;  Location: [place];  Service: Gastroenterology;  Laterality: N/A;   ELBOW SURGERY     left   ENDOSCOPIC MUCOSAL RESECTION N/A  05/01/2020   Procedure: ENDOSCOPIC MUCOSAL RESECTION;  Surgeon: Rush Landmark Telford Nab., MD;  Location: [place];  Service: Gastroenterology;  Laterality: N/A;   ENDOSCOPIC MUCOSAL RESECTION N/A 05/07/2021   Procedure: ENDOSCOPIC MUCOSAL RESECTION;  Surgeon: Rush Landmark Telford Nab., MD;  Location: [place];  Service: Gastroenterology;  Laterality: N/A;   HEMOSTASIS CLIP PLACEMENT  05/01/2020   Procedure: HEMOSTASIS CLIP PLACEMENT;  Surgeon: Irving Copas., MD;  Location: [place];  Service: Gastroenterology;;   HEMOSTASIS CLIP PLACEMENT  05/07/2021   Procedure: HEMOSTASIS CLIP PLACEMENT;  Surgeon: Irving Copas., MD;  Location: [place];  Service: Gastroenterology;;   HOT HEMOSTASIS N/A 05/07/2021   Procedure: HOT HEMOSTASIS (ARGON PLASMA COAGULATION/BICAP);  Surgeon: Irving Copas., MD;  Location: [place];  Service: Gastroenterology;  Laterality: N/A;   LAPAROSCOPY  04-22-10   lysis of adhesions   LAPAROSCOPY  04-30-10    with mini laparotomy and cystotomy repair   plantar fascitis     right foot   POLYPECTOMY  05/01/2020   Procedure: POLYPECTOMY;  Surgeon: Mansouraty, Telford Nab., MD;  Location: [place];  Service: Gastroenterology;;   POLYPECTOMY  05/07/2021   Procedure: POLYPECTOMY;  Surgeon: Irving Copas., MD;  Location: [place];  Service: Gastroenterology;;   SALPINGECTOMY  04-22-10   right   SUBMUCOSAL LIFTING INJECTION  05/01/2020   Procedure: SUBMUCOSAL LIFTING INJECTION;  Surgeon: Irving Copas., MD;  Location: [place];  Service: Gastroenterology;;   Maryagnes Amos INJECTION  05/07/2021   Procedure: SUBMUCOSAL LIFTING INJECTION;  Surgeon: Irving Copas., MD;  Location: [place];  Service: Gastroenterology;;   TOTAL HIP ARTHROPLASTY Right 04/25/2017   Procedure: RIGHT TOTAL HIP ARTHROPLASTY ANTERIOR APPROACH;  Surgeon: Rod Can, MD;  Location: [place];  Service: Orthopedics;  Laterality: Right;   Needs RNFA   TUBAL LIGATION     WRIST SURGERY     right/ganglion cyst    Allergies  Allergies  Allergen Reactions   Morphine Hives and Nausea And Vomiting    INTOLERANCE >  N & V   Penicillins Hives, Itching and Other (See Comments)     PATIENT HAS HAD A PCN REACTION WITH IMMEDIATE RASH, FACIAL/TONGUE/THROAT SWELLING, SOB, OR LIGHTHEADEDNESS WITH HYPOTENSION:  #  #  #  YES  #  #  #   Has patient had a PCN reaction causing severe rash involving mucus membranes or skin necrosis: Unknown Has patient had a PCN reaction that required hospitalization: No Has patient had a PCN reaction occurring within the last 10 years: No If all of the above answers are "  NO", then may proceed with Cephalosporin use.    Clarithromycin Nausea And Vomiting     EKGs/Labs/Other Studies Reviewed:   The following studies were reviewed today:  Coronary CTA 02/03/21  FINDINGS: Coronary calcium score: The patient's coronary artery calcium score is 1, which places the patient in the 72 percentile.   Coronary arteries: Normal coronary origins.  Right dominance.   Right Coronary Artery: Normal caliber vessel, gives rise to PDA. No significant plaque or stenosis.   Left Main Coronary Artery: Normal caliber vessel. No significant plaque or stenosis.   Left Anterior Descending Coronary Artery: Normal caliber vessel. There is predominantly noncalcified plaque in the proximal LAD with 1-24% stenosis. Gives rise to 2 diagonal branches.   Left Circumflex Artery: Normal caliber vessel. No significant plaque or stenosis. Gives rise to 2 OM branches.   Aorta: Normal size, 26 mm at the mid ascending aorta (level of the PA bifurcation) measured double oblique. Aortic arch not well seen. No dissection seen in visualized portions of the aorta.   Aortic Valve: No calcifications. Trileaflet.   Other findings:   Normal pulmonary vein drainage into the left atrium.   Normal left atrial appendage without a  thrombus.   Normal size of the pulmonary artery.   Normal appearance of the pericardium.   IMPRESSION: 1.  Minimal nonobstructive CAD, CADRADS = 1.   2. Coronary calcium score of 1. This was 72nd percentile for age and sex matched control.   3. Normal coronary origin with right dominance.  Echocardiogram 02/06/2021 IMPRESSIONS     1. Left ventricular ejection fraction, by estimation, is 55 to 60%. Left  ventricular ejection fraction by PLAX is 58 %. The left ventricle has  normal function. The left ventricle has no regional wall motion  abnormalities. There is mild left ventricular  hypertrophy of the basal-septal segment. Left ventricular diastolic  parameters are consistent with Grade I diastolic dysfunction (impaired  relaxation).   2. Right ventricular systolic function is normal. The right ventricular  size is normal. There is normal pulmonary artery systolic pressure.   3. The mitral valve is normal in structure. No evidence of mitral valve  regurgitation. No evidence of mitral stenosis.   4. The aortic valve is normal in structure. Aortic valve regurgitation is  not visualized. No aortic stenosis is present.   5. The inferior vena cava is normal in size with greater than 50%  respiratory variability, suggesting right atrial pressure of 3 mmHg.   FINDINGS   Left Ventricle: Left ventricular ejection fraction, by estimation, is 55  to 60%. Left ventricular ejection fraction by PLAX is 58 %. The left  ventricle has normal function. The left ventricle has no regional wall  motion abnormalities. The left  ventricular internal cavity size was normal in size. There is mild left  ventricular hypertrophy of the basal-septal segment. Left ventricular  diastolic parameters are consistent with Grade I diastolic dysfunction  (impaired relaxation). Normal left  ventricular filling pressure.   Right Ventricle: The right ventricular size is normal. No increase in  right  ventricular wall thickness. Right ventricular systolic function is  normal. There is normal pulmonary artery systolic pressure. The tricuspid  regurgitant velocity is 1.00 m/s, and   with an assumed right atrial pressure of 3 mmHg, the estimated right  ventricular systolic pressure is 7.0 mmHg.   Left Atrium: Left atrial size was normal in size.   Right Atrium: Right atrial size was normal in size.   Pericardium: There  is no evidence of pericardial effusion.   Mitral Valve: The mitral valve is normal in structure. No evidence of  mitral valve regurgitation. No evidence of mitral valve stenosis.   Tricuspid Valve: The tricuspid valve is normal in structure. Tricuspid  valve regurgitation is trivial. No evidence of tricuspid stenosis.   Aortic Valve: The aortic valve is normal in structure. Aortic valve  regurgitation is not visualized. No aortic stenosis is present.   Pulmonic Valve: The pulmonic valve was normal in structure. Pulmonic valve  regurgitation is not visualized. No evidence of pulmonic stenosis.   Aorta: The aortic root is normal in size and structure.   Venous: The inferior vena cava is normal in size with greater than 50%  respiratory variability, suggesting right atrial pressure of 3 mmHg.   IAS/Shunts: No atrial level shunt detected by color flow Doppler.    EKG:  EKG is not ordered today. EKG in the ED reviewed without any ST elevated or changes.   Recent Labs: 02/01/2022: ALT 17 03/02/2022: BUN 18; Creatinine, Ser 1.32; Hemoglobin 12.7; Platelets 191; Potassium 3.0; Sodium 137  Recent Lipid Panel    Component Value Date/Time   CHOL 174 02/01/2022 0913   CHOL 246 (H) 08/20/2014 0809   TRIG 108.0 02/01/2022 0913   TRIG 156 (H) 08/20/2014 0809   HDL 49.00 02/01/2022 0913   HDL 63 08/20/2014 0809   CHOLHDL 4 02/01/2022 0913   VLDL 21.6 02/01/2022 0913   LDLCALC 103 (H) 02/01/2022 0913   LDLCALC 146 (H) 11/28/2019 1131   LDLCALC 152 (H) 08/20/2014 0809    LDLDIRECT 160.5 08/30/2012 1434    Home Medications   Current Meds  Medication Sig   acetaminophen (TYLENOL) 500 MG tablet Take 2,000 mg by mouth daily as needed for moderate pain.   benzonatate (TESSALON) 100 MG capsule Take 1 capsule (100 mg total) by mouth 3 (three) times daily as needed.   dicyclomine (BENTYL) 10 MG capsule TAKE 1 CAPSULE (10 MG TOTAL) BY MOUTH 4 (FOUR) TIMES DAILY - BEFORE MEALS AND AT BEDTIME. (Patient taking differently: Take 10 mg by mouth 2 (two) times daily.)   DULoxetine (CYMBALTA) 60 MG capsule Take 1 capsule (60 mg total) by mouth at bedtime.   furosemide (LASIX) 20 MG tablet TAKE 1 TABLET BY MOUTH DAILY AS NEEDED FOR FLUID.   HYDROmorphone (DILAUDID) 2 MG tablet Take 2 mg by mouth 5 (five) times daily.   lamoTRIgine (LAMICTAL) 200 MG tablet Take 1 tablet (200 mg total) by mouth at bedtime.   linaclotide (LINZESS) 145 MCG CAPS capsule Take 1 capsule (145 mcg total) by mouth daily before breakfast.   metFORMIN (GLUCOPHAGE-XR) 500 MG 24 hr tablet Take 1 tablet (500 mg total) by mouth daily with breakfast.   nitroGLYCERIN (NITROSTAT) 0.4 MG SL tablet Place 1 tablet (0.4 mg total) under the tongue every 5 (five) minutes as needed for chest pain.   pantoprazole (PROTONIX) 40 MG tablet TAKE 1 TABLET BY MOUTH EVERY DAY   pravastatin (PRAVACHOL) 40 MG tablet Take 1 tablet (40 mg total) by mouth daily.   risperiDONE (RISPERDAL) 1 MG tablet Take 1 tablet (1 mg total) by mouth at bedtime.   rizatriptan (MAXALT-MLT) 10 MG disintegrating tablet Take 1 tablet (10 mg total) by mouth as needed for migraine. May repeat in 2 hours if needed.  Maximum 2 tablets in 24 hours   rOPINIRole (REQUIP) 1 MG tablet TAKE 2 TABLETS (2 MG TOTAL) BY MOUTH AT BEDTIME.   Semaglutide-Weight Management (WEGOVY)  0.25 MG/0.5ML SOAJ Inject 0.25 mg into the skin once a week.   simvastatin (ZOCOR) 40 MG tablet Take 40 mg by mouth daily.   topiramate (TOPAMAX) 200 MG tablet TAKE 1 TABLET BY MOUTH  EVERYDAY AT BEDTIME   traZODone (DESYREL) 100 MG tablet Take 2 tablets (200 mg total) by mouth at bedtime as needed for sleep.     Review of Systems      All other systems reviewed and are otherwise negative except as noted above.  Physical Exam    VS:  BP 116/72   Pulse 62   Ht '5\' 6"'$  (1.676 m)   Wt 236 lb 9.6 oz (107.3 kg)   LMP  (LMP Unknown)   SpO2 96%   BMI 38.19 kg/m  , BMI Body mass index is 38.19 kg/m.  Wt Readings from Last 3 Encounters:  03/09/22 236 lb 9.6 oz (107.3 kg)  03/02/22 232 lb (105.2 kg)  02/01/22 241 lb 6.4 oz (109.5 kg)     GEN: Well nourished, well developed, in no acute distress. HEENT: normal. Neck: Supple, no JVD, carotid bruits, or masses. Cardiac: RRR, no murmurs, rubs, or gallops. No clubbing, cyanosis, edema.  Radials/PT 2+ and equal bilaterally.  Respiratory:  Respirations regular and unlabored, clear to auscultation bilaterally. GI: Soft, nontender, nondistended. MS: No deformity or atrophy. Skin: Warm and dry, no rash. Neuro:  Strength and sensation are intact. Psych: Normal affect.  Assessment & Plan    Preop Eval   Ms. Cespedes's perioperative risk of a major cardiac event is 0.4% according to the Revised Cardiac Risk Index (RCRI).  Therefore, she is at low risk for perioperative complications.   Her functional capacity is good at 5.87 METs according to the [facility] Activity Status Index (DASI). Recommendations: Echocardiogram reviewed.  No issues.  Okay to proceed with upcoming procedure.   Chest pain of uncertain etiology -she has a history of both GERD and panic attacks -troponin was normal and EKG unremarkable -continue nitro as needed -CT from a year ago reviewed with the patient as well as echo -update echo today  DOE -slightly worse over the year -will update an echo -continue to increase exercise as able (patient has some orthopedic issues) to increase exercise intolerance         Disposition: Follow up 1 month with  Fransico Him, MD or APP.  Signed, Elgie Collard, PA-C 03/09/2022, 12:53 PM [facility]

## 2022-03-09 ENCOUNTER — Encounter: Payer: Self-pay | Admitting: Physician Assistant

## 2022-03-09 ENCOUNTER — Ambulatory Visit: Payer: PPO | Attending: Physician Assistant | Admitting: Physician Assistant

## 2022-03-09 ENCOUNTER — Telehealth (HOSPITAL_BASED_OUTPATIENT_CLINIC_OR_DEPARTMENT_OTHER): Payer: PPO | Admitting: Psychiatry

## 2022-03-09 ENCOUNTER — Encounter (HOSPITAL_COMMUNITY): Payer: Self-pay | Admitting: Psychiatry

## 2022-03-09 VITALS — BP 116/72 | HR 62 | Ht 66.0 in | Wt 236.6 lb

## 2022-03-09 DIAGNOSIS — F319 Bipolar disorder, unspecified: Secondary | ICD-10-CM | POA: Diagnosis not present

## 2022-03-09 DIAGNOSIS — R600 Localized edema: Secondary | ICD-10-CM | POA: Diagnosis not present

## 2022-03-09 DIAGNOSIS — Z01810 Encounter for preprocedural cardiovascular examination: Secondary | ICD-10-CM

## 2022-03-09 DIAGNOSIS — R079 Chest pain, unspecified: Secondary | ICD-10-CM | POA: Diagnosis not present

## 2022-03-09 DIAGNOSIS — F4310 Post-traumatic stress disorder, unspecified: Secondary | ICD-10-CM

## 2022-03-09 DIAGNOSIS — R0609 Other forms of dyspnea: Secondary | ICD-10-CM

## 2022-03-09 MED ORDER — DULOXETINE HCL 60 MG PO CPEP: 60.0000 mg | ORAL_CAPSULE | Freq: Every day | ORAL | 0 refills | Status: DC

## 2022-03-09 MED ORDER — TRAZODONE HCL 100 MG PO TABS: 200.0000 mg | ORAL_TABLET | Freq: Every evening | ORAL | 2 refills | Status: DC | PRN

## 2022-03-09 MED ORDER — LAMOTRIGINE 200 MG PO TABS: 200.0000 mg | ORAL_TABLET | Freq: Every day | ORAL | 0 refills | Status: DC

## 2022-03-09 MED ORDER — RISPERIDONE 1 MG PO TABS: 1.0000 mg | ORAL_TABLET | Freq: Every day | ORAL | 2 refills | Status: DC

## 2022-03-09 NOTE — Patient Instructions (Signed)
Medication Instructions:  Your physician recommends that you continue on your current medications as directed. Please refer to the Current Medication list given to you today.  *If you need a refill on your cardiac medications before your next appointment, please call your pharmacy*   Lab Work: None ordered If you have labs (blood work) drawn today and your tests are completely normal, you will receive your results only by: [place] (if you have MyChart) OR A paper copy in the mail If you have any lab test that is abnormal or we need to change your treatment, we will call you to review the results.   Testing/Procedures: Your physician has requested that you have an echocardiogram. Echocardiography is a painless test that uses sound waves to create images of your heart. It provides your doctor with information about the size and shape of your heart and how well your heart's chambers and valves are working. This procedure takes approximately one hour. There are no restrictions for this procedure. Please do NOT wear cologne, perfume, aftershave, or lotions (deodorant is allowed). Please arrive 15 minutes prior to your appointment time.    Follow-Up: At [facility], you and your health needs are our priority.  As part of our continuing mission to provide you with exceptional heart care, we have created designated Provider Care Teams.  These Care Teams include your primary Cardiologist (physician) and Advanced Practice Providers (APPs -  Physician Assistants and Nurse Practitioners) who all work together to provide you with the care you need, when you need it.   Your next appointment:   1 month(s)  Provider:   Fransico Him, MD  or Nicholes Rough, PA-C
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
## 2022-03-10 ENCOUNTER — Encounter
Admission: RE | Admit: 2022-03-10 | Discharge: 2022-03-10 | Disposition: A | Discharge status: Home / Self Care | Payer: PPO | Source: Ambulatory Visit | Attending: Surgery | Admitting: Surgery

## 2022-03-10 DIAGNOSIS — E119 Type 2 diabetes mellitus without complications: Secondary | ICD-10-CM

## 2022-03-10 DIAGNOSIS — Z01818 Encounter for other preprocedural examination: Secondary | ICD-10-CM

## 2022-03-10 DIAGNOSIS — Z79899 Other long term (current) drug therapy: Secondary | ICD-10-CM

## 2022-03-10 DIAGNOSIS — Z01812 Encounter for preprocedural laboratory examination: Secondary | ICD-10-CM

## 2022-03-10 HISTORY — DX: Morbid (severe) obesity due to excess calories: E66.01

## 2022-03-10 HISTORY — DX: Personal history of urinary calculi: Z87.442

## 2022-03-10 HISTORY — DX: Post-traumatic stress disorder, unspecified: F43.10

## 2022-03-10 HISTORY — DX: Family history of other specified conditions: Z84.89

## 2022-03-10 HISTORY — DX: Gastroparesis: K31.84

## 2022-03-10 HISTORY — DX: Type 2 diabetes mellitus without complications: E11.9

## 2022-03-10 NOTE — Patient Instructions (Signed)
Your procedure is scheduled on:1-25 24 Thursday Report to the Registration Desk on the 1st floor of the [facility].Then proceed to the 2nd floor Surgery Desk To find out your arrival time, please call [phone] between 1PM - 3PM on:03-17-22 Wednesday If your arrival time is 6:00 am, do not arrive prior to that time as the [facility] entrance doors do not open until 6:00 am.  REMEMBER: Instructions that are not followed completely may result in serious medical risk, up to and including death; or upon the discretion of your surgeon and anesthesiologist your surgery may need to be rescheduled.  Do not eat food after midnight the night before surgery.  No gum chewing, lozengers or hard candies.  You may however, drink Water up to 2 hours before you are scheduled to arrive for your surgery. Do not drink anything within 2 hours of your scheduled arrival time.  In addition, your doctor has ordered for you to drink the provided  Gatorade G2 Drinking this carbohydrate drink up to two hours before surgery helps to reduce insulin resistance and improve patient outcomes. Please complete drinking 2 hours prior to scheduled arrival time.  TAKE THESE MEDICATIONS THE MORNING OF SURGERY WITH A SIP OF WATER: -pantoprazole (PROTONIX)  -HYDROmorphone (DILAUDID)   Do NOT start your Semaglutide [facility]) until AFTER your surgery  Stop your Metformin 2 days prior to surgery-Last dose will be on 03-15-22 (Monday)  One week prior to surgery: Stop Anti-inflammatories (NSAIDS) such as Advil, Aleve, Ibuprofen, Motrin, Naproxen, Naprosyn and Aspirin based products such as Excedrin, Goodys Powder, BC Powder.You may however, continue to take Tylenol if needed for pain up until the day of surgery.  Stop ANY OVER THE COUNTER supplements/vitamins NOW (03-10-22) until after surgery.  No Alcohol for 24 hours before or after surgery.  No Smoking including e-cigarettes for 24 hours prior to surgery.  No chewable  tobacco products for at least 6 hours prior to surgery.  No nicotine patches on the day of surgery.  Do not use any "recreational" drugs for at least a week prior to your surgery.  Please be advised that the combination of cocaine and anesthesia may have negative outcomes, up to and including death. If you test positive for cocaine, your surgery will be cancelled.  On the morning of surgery brush your teeth with toothpaste and water, you may rinse your mouth with mouthwash if you wish. Do not swallow any toothpaste or mouthwash.  Use CHG Soap as directed on instruction sheet.  Do not wear jewelry, make-up, hairpins, clips or nail polish.  Do not wear lotions, powders, or perfumes.   Do not shave body from the neck down 48 hours prior to surgery just in [place] you cut yourself which could leave a site for infection.  Also, freshly shaved skin may become irritated if using the CHG soap.  Contact lenses, hearing aids and dentures may not be worn into surgery.  Do not bring valuables to the hospital. [facility] is not responsible for any missing/lost belongings or valuables.   Notify your doctor if there is any change in your medical condition (cold, fever, infection).  Wear comfortable clothing (specific to your surgery type) to the hospital.  After surgery, you can help prevent lung complications by doing breathing exercises.  Take deep breaths and cough every 1-2 hours. Your doctor may order a device called an Incentive Spirometer to help you take deep breaths. When coughing or sneezing, hold a pillow firmly against your incision  with both hands. This is called "splinting." Doing this helps protect your incision. It also decreases belly discomfort.  If you are being admitted to the hospital overnight, leave your suitcase in the car. After surgery it may be brought to your room.  If you are being discharged the day of surgery, you will not be allowed to drive home. You will need a  responsible adult (18 years or older) to drive you home and stay with you that night.   If you are taking public transportation, you will need to have a responsible adult (18 years or older) with you. Please confirm with your physician that it is acceptable to use public transportation.   Please call the [facility] Dept. at [phone] if you have any questions about these instructions.  Surgery Visitation Policy:  Patients undergoing a surgery or procedure may have two family members or support persons with them as long as the person is not COVID-19 positive or experiencing its symptoms.   Due to an increase in RSV and influenza rates and associated hospitalizations, children ages 75 and under will not be able to visit patients in [facility]. Masks continue to be strongly recommended.   How to Use an Incentive Spirometer An incentive spirometer is a tool that measures how well you are filling your lungs with each breath. Learning to take long, deep breaths using this tool can help you keep your lungs clear and active. This may help to reverse or lessen your chance of developing breathing (pulmonary) problems, especially infection. You may be asked to use a spirometer: After a surgery. If you have a lung problem or a history of smoking. After a long period of time when you have been unable to move or be active. If the spirometer includes an indicator to show the highest number that you have reached, your health care provider or respiratory therapist will help you set a goal. Keep a log of your progress as told by your health care provider. What are the risks? Breathing too quickly may cause dizziness or cause you to pass out. Take your time so you do not get dizzy or light-headed. If you are in pain, you may need to take pain medicine before doing incentive spirometry. It is harder to take a deep breath if you are having pain. How to use your incentive  spirometer  Sit up on the edge of your bed or on a chair. Hold the incentive spirometer so that it is in an upright position. Before you use the spirometer, breathe out normally. Place the mouthpiece in your mouth. Make sure your lips are closed tightly around it. Breathe in slowly and as deeply as you can through your mouth, causing the piston or the ball to rise toward the top of the chamber. Hold your breath for 3-5 seconds, or for as long as possible. If the spirometer includes a coach indicator, use this to guide you in breathing. Slow down your breathing if the indicator goes above the marked areas. Remove the mouthpiece from your mouth and breathe out normally. The piston or ball will return to the bottom of the chamber. Rest for a few seconds, then repeat the steps 10 or more times. Take your time and take a few normal breaths between deep breaths so that you do not get dizzy or light-headed. Do this every 1-2 hours when you are awake. If the spirometer includes a goal marker to show the highest number you have reached (  best effort), use this as a goal to work toward during each repetition. After each set of 10 deep breaths, cough a few times. This will help to make sure that your lungs are clear. If you have an incision on your chest or abdomen from surgery, place a pillow or a rolled-up towel firmly against the incision when you cough. This can help to reduce pain while taking deep breaths and coughing. General tips When you are able to get out of bed: Walk around often. Continue to take deep breaths and cough in order to clear your lungs. Keep using the incentive spirometer until your health care provider says it is okay to stop using it. If you have been in the hospital, you may be told to keep using the spirometer at home. Contact a health care provider if: You are having difficulty using the spirometer. You have trouble using the spirometer as often as instructed. Your pain  medicine is not giving enough relief for you to use the spirometer as told. You have a fever. Get help right away if: You develop shortness of breath. You develop a cough with bloody mucus from the lungs. You have fluid or blood coming from an incision site after you cough. Summary An incentive spirometer is a tool that can help you learn to take long, deep breaths to keep your lungs clear and active. You may be asked to use a spirometer after a surgery, if you have a lung problem or a history of smoking, or if you have been inactive for a long period of time. Use your incentive spirometer as instructed every 1-2 hours while you are awake. If you have an incision on your chest or abdomen, place a pillow or a rolled-up towel firmly against your incision when you cough. This will help to reduce pain. Get help right away if you have shortness of breath, you cough up bloody mucus, or blood comes from your incision when you cough. This information is not intended to replace advice given to you by your health care provider. Make sure you discuss any questions you have with your health care provider. Document Revised: 04/30/2019 Document Reviewed: 04/30/2019 Elsevier Patient Education  [place].

## 2022-03-11 ENCOUNTER — Telehealth: Payer: Self-pay

## 2022-03-11 ENCOUNTER — Encounter: Payer: Self-pay | Admitting: Surgery

## 2022-03-11 NOTE — Telephone Encounter (Signed)
Confirmed patient was able to refill nitro, patient acknowledges Echo scheduled for tomorrow 03/12/22. Danae Chen, RN

## 2022-03-11 NOTE — Progress Notes (Signed)
Perioperative Services  Pre-Admission/Anesthesia Testing Clinical Review  Date: 03/16/22  Patient Demographics:  Name: [name] White DOB:   [birth year] MRN:   [id number]  Planned Surgical Procedure(s):    [place]aud: [id number] Date/Time: 03/18/22 1007   Procedure: SHOULDER ARTHROSCOPY WITH DEBRIDEMENT, DECOMPRESSION, ROTATOR CUFF REPAIR AND BICEPS TENODESIS (Right: Shoulder)   Anesthesia type: Choice   Pre-op diagnosis:      Nontraumatic complete tear of right rotator cuff M75.121     Rotator cuff tendinitis, right M75.81     Injury of tendon of long head of right biceps, initial encounter S46.101A   Location: [place] [facility] / [place] [facility]   Surgeons: Corky Mull, MD   NOTE: Available PAT nursing documentation and vital signs have been reviewed. Clinical nursing staff has updated patient's PMH/PSHx, current medication list, and drug allergies/intolerances to ensure comprehensive history available to assist in medical decision making as it pertains to the aforementioned surgical procedure and anticipated anesthetic course. Extensive review of available clinical information performed. [place] PM[facility] and PSHx updated with any diagnoses/procedures that  may have been inadvertently omitted during her intake with the pre-admission testing department's nursing staff.  Clinical Discussion:  [name] White is a 57 y.o. female who is submitted for pre-surgical anesthesia review and clearance prior to her undergoing the above procedure. Patient is a Former Smoker (40 pack years; quit 02/2021). Pertinent PMH includes: CAD, diastolic dysfunction, palpitations, HLD, T2DM, GERD (on daily PPI), Barrett's esophagus, gastroparesis, OSAH (does not tolerate nocturnal PAP therapy), DOE, OA, RLS, PTSD, anxiety, depression, panic attacks, bipolar disorder, previous suicide attempt.  Patient is followed by cardiology Radford Pax, MD). She was last seen in the cardiology clinic on 03/09/2022; notes  reviewed.  At the time of her clinic visit, patient reporting recent episodes of chest pain, shortness breath, and palpitations.  Patient reported that called EMS and was given NTG on scene, which improved her symptoms.  Patient was taken to the ED, however left without being seen.  In review of her ED workup, ECG and troponins were normal.  Patient denies any episodes of PND, orthopnea, significant peripheral edema, vertiginous symptoms, or presyncope/syncope.  Patient with a past medical history significant for cardiovascular diagnoses.  Myocardial perfusion imaging study performed on 12/18/2015 revealed a normal left ventricular systolic function with an EF of 63%.  There were no regional wall motion abnormalities.  There was no evidence of stress-induced myocardial ischemia or arrhythmia; no scintigraphic evidence of scar.  Study determined to be normal and low risk.  Coronary CTA performed on 02/03/2021 revealed a coronary calcium score of 1, which was the 72nd percentile for age and sex matched control.  Study revealed normal coronary origin with RIGHT-sided dominance.  There were no significant extracardiac findings.  TTE performed on 02/06/2021 revealed normal left ventricular systolic function with an EF of 55-60%.  There was mild LVH of the basal septal segment. Left ventricular diastolic Doppler parameters consistent with abnormal relaxation (G1DD).  Right ventricular size and function normal.  No significant valvular regurgitation/insufficiency.  All transvalvular gradients were noted to be normal with no evidence suggestive of valvular stenosis.  Blood pressure well-controlled at 116/72 mmHg on currently prescribed diuretic (furosemide) monotherapy.  Patient on pravastatin for her HLD diagnosis and further ASCVD prevention.  She has a supply of short acting nitrates (NTG) to use on a as needed basis for recurrent angina/anginal equivalent symptoms.  Since being seen in the ED, patient reported  that she had  used her NTG x 1 additional dose with complete resolution of symptoms; no further use. T2DM well-controlled on currently prescribed regimen; last HgbA1c was 6.3% when checked on 02/01/2022.  Patient does have an OSAH diagnosis, however she does not tolerate the prescribed nocturnal PAP therapy due to anxiety and associated xerostomia associated with therapy.  Mobility and overall level of activity limited due to arthritides (hip and lower back), however patient still felt to be able to achieve at least 4 METS of physical activity without experiencing any angina/anginal equivalent symptoms. No changes were made to her medication regimen.  In light of her upcoming elective orthopedic surgery, cardiology requesting repeat echocardiogram to follow-up on patient's chest pain and slightly worsening DOE.  Patient follow-up with outpatient cardiology and 1 month or sooner if needed.  Since being seen by cardiology, patient has undergone the requested repeat cardiovascular testing.  TTE performed on 03/12/2022 revealed normal left ventricular systolic function with an EF of 60-65%. There were no regional wall motion abnormalities. Right ventricular size and function normal. There was trivial mitral and aortic valve regurgitation observed. Again, all transvalvular gradients were noted to be normal providing no evidence suggestive of valvular stenosis.  [name] White is scheduled for an elective SHOULDER ARTHROSCOPY WITH DEBRIDEMENT, DECOMPRESSION, ROTATOR CUFF REPAIR AND BICEPS TENODESIS on 03/18/2022 with Dr. Milagros Evener, MD. Given patient's past medical history significant for cardiovascular diagnoses, presurgical cardiac clearance was sought by the PAT team.  Per cardiology, "patient's RCRI is 0.4%. Therefore, she is at low risk for perioperative complications.  Her functional capacity is good at 5.87 METS. Ok to proceed with planned surgical intervention without restriction at an overall ACCEPTABLE risk  of perioperative complications". In review of her medication reconciliation, the patient is not noted to be taking any type of anticoagulation or antiplatelet therapies that would need to be held during her perioperative course.  Patient denies previous perioperative complications with anesthesia in the past.  Patient does make mention of a (+) history of delayed emergence from general anesthesia in a first-degree relative (brother).  In review of the available records, it is noted that patient underwent a MAC anesthetic course at [facility] in 04/2021 without documented complications.      03/09/2022    2:47 PM 03/09/2022   11:05 AM 03/03/2022   12:15 AM  Vitals with BMI  Height  _0    Weight  236 lbs 10 oz   BMI  94.85   Systolic  462 703  Diastolic  72 74  Pulse  62 65     Information is confidential and restricted. Go to Review Flowsheets to unlock data.    Providers/Specialists:   NOTE: Primary physician provider listed below. Patient may have been seen by APP or partner within same practice.   PROVIDER ROLE / SPECIALTY LAST OV  Poggi, Marshall Cork, MD Orthopedics (Surgeon) 03/05/2022  Inda Coke, [place] Primary Care Provider 02/23/2022  Fransico Him, MD Cardiology 03/09/2022  Berniece Andreas, MD Psychiatry 03/09/2022   Allergies:  Morphine, Penicillins, and Clarithromycin  Current Home Medications:   No current facility-administered medications for this encounter.    acetaminophen (TYLENOL) 500 MG tablet   dicyclomine (BENTYL) 10 MG capsule   DULoxetine (CYMBALTA) 60 MG capsule   furosemide (LASIX) 20 MG tablet   HYDROmorphone (DILAUDID) 2 MG tablet   HYDROmorphone (DILAUDID) 2 MG tablet   lamoTRIgine (LAMICTAL) 200 MG tablet   linaclotide (LINZESS) 145 MCG CAPS capsule   metFORMIN (GLUCOPHAGE-XR)  500 MG 24 hr tablet   nitroGLYCERIN (NITROSTAT) 0.4 MG SL tablet   pantoprazole (PROTONIX) 40 MG tablet   potassium chloride SA (KLOR-CON M) 20 MEQ tablet    pravastatin (PRAVACHOL) 40 MG tablet   risperiDONE (RISPERDAL) 1 MG tablet   rizatriptan (MAXALT-MLT) 10 MG disintegrating tablet   rOPINIRole (REQUIP) 1 MG tablet   Semaglutide-Weight Management (WEGOVY) 0.25 MG/0.5ML SOAJ   topiramate (TOPAMAX) 200 MG tablet   traZODone (DESYREL) 100 MG tablet   History:   Past Medical History:  Diagnosis Date   Anterolisthesis    Arthritis    Barrett's esophagus    egd 2007 + 2009 but none 2011 or 08/2010 egd   Bipolar 1 disorder (HCC)    CAD (coronary artery disease) 02/03/2021   a.) cCTA 02/03/2021: Ca score = 1 (72nd percentile for age/sex match control)   Chronic, continuous use of opioids    Colon polyp, hyperplastic    Depression    Diastolic dysfunction 10/05/4816   a.) TTE 02/06/2021: EF 55-60%, mild LVH, G1DD   DOE (dyspnea on exertion)    Family history of adverse reaction to anesthesia    a.) delayed emergence in 1st degree relative (brother)   Gastroparesis    Generalized anxiety disorder with panic attacks    GERD (gastroesophageal reflux disease)    Grief reaction    Hidradenitis suppurativa    History of kidney stones    HLD (hyperlipidemia)    IBS (irritable bowel syndrome)    Migraine    Morbid obesity (HCC)    OSA (obstructive sleep apnea)    a.) did not tolerate nocturnal PAP therapy mainly due to anxiety and associated xerostomia   Ovarian cyst    Palpitations    Plantar fasciitis    PTSD (post-traumatic stress disorder)    Restless leg    a.) on ropinirole   Suicide attempt ([place]) 2003   Tobacco abuse    Type 2 diabetes mellitus treated without insulin ([place])    Past Surgical History:  Procedure Laterality Date   ABDOMINAL HYSTERECTOMY     secondary to endometriosis   BACK SURGERY Right 2019   lumbar ablation L3-S1 x 2    BIOPSY  05/07/2021   Procedure: BIOPSY;  Surgeon: Irving Copas., MD;  Location: [facility];  Service: Gastroenterology;;   CESAREAN SECTION     x 2   CHOLECYSTECTOMY      COLONOSCOPY W/ POLYPECTOMY  2012   COLONOSCOPY WITH PROPOFOL N/A 05/01/2020   Procedure: COLONOSCOPY WITH PROPOFOL;  Surgeon: Irving Copas., MD;  Location: [facility];  Service: Gastroenterology;  Laterality: N/A;   COLONOSCOPY WITH PROPOFOL N/A 05/07/2021   Procedure: COLONOSCOPY WITH PROPOFOL;  Surgeon: Rush Landmark Telford Nab., MD;  Location: [place];  Service: Gastroenterology;  Laterality: N/A;   ELBOW SURGERY Left    ENDOSCOPIC MUCOSAL RESECTION N/A 05/01/2020   Procedure: ENDOSCOPIC MUCOSAL RESECTION;  Surgeon: Rush Landmark Telford Nab., MD;  Location: [place];  Service: Gastroenterology;  Laterality: N/A;   ENDOSCOPIC MUCOSAL RESECTION N/A 05/07/2021   Procedure: ENDOSCOPIC MUCOSAL RESECTION;  Surgeon: Rush Landmark Telford Nab., MD;  Location: [place];  Service: Gastroenterology;  Laterality: N/A;   GANGLION CYST EXCISION Right    HEMOSTASIS CLIP PLACEMENT  05/01/2020   Procedure: HEMOSTASIS CLIP PLACEMENT;  Surgeon: Irving Copas., MD;  Location: [place];  Service: Gastroenterology;;   HEMOSTASIS CLIP PLACEMENT  05/07/2021   Procedure: HEMOSTASIS CLIP PLACEMENT;  Surgeon: Irving Copas., MD;  Location: [place];  Service: Gastroenterology;;   HOT HEMOSTASIS N/A 05/07/2021   Procedure: HOT HEMOSTASIS (ARGON PLASMA COAGULATION/BICAP);  Surgeon: Irving Copas., MD;  Location: [place];  Service: Gastroenterology;  Laterality: N/A;   LAPAROSCOPIC LYSIS OF ADHESIONS  04/22/2010   LAPAROSCOPY  04/30/2010   with mini laparotomy and cystotomy repair   PLANTAR FASCIA SURGERY Right    POLYPECTOMY  05/01/2020   Procedure: POLYPECTOMY;  Surgeon: Rush Landmark Telford Nab., MD;  Location: [place];  Service: Gastroenterology;;   POLYPECTOMY  05/07/2021   Procedure: POLYPECTOMY;  Surgeon: Irving Copas., MD;  Location: [place];  Service: Gastroenterology;;   SALPINGECTOMY Right 04/22/2010   SUBMUCOSAL LIFTING INJECTION   05/01/2020   Procedure: SUBMUCOSAL LIFTING INJECTION;  Surgeon: Irving Copas., MD;  Location: [place];  Service: Gastroenterology;;   SUBMUCOSAL LIFTING INJECTION  05/07/2021   Procedure: SUBMUCOSAL LIFTING INJECTION;  Surgeon: Irving Copas., MD;  Location: [place];  Service: Gastroenterology;;   TOTAL HIP ARTHROPLASTY Right 04/25/2017   Procedure: RIGHT TOTAL HIP ARTHROPLASTY ANTERIOR APPROACH;  Surgeon: Rod Can, MD;  Location: [place];  Service: Orthopedics;  Laterality: Right;  Needs RNFA   TUBAL LIGATION     Family History  Problem Relation Age of Onset   COPD Mother    Stroke Mother    Coronary artery disease Mother        pacer   Colon polyps Mother    Breast cancer Mother    Migraines Mother    Other Mother        RSDS   Rheum arthritis Mother    Hypertension Father    Colon polyps Brother    Colon cancer Maternal Grandfather    Colon polyps Maternal Grandfather    Breast cancer Maternal Aunt    Uterine cancer Cousin        x 2   Aneurysm Maternal Grandmother    Kidney disease Brother    Other Brother        back pain   Kidney cancer Paternal Aunt    Breast cancer Cousin    Stomach cancer Neg Hx    Pancreatic cancer Neg Hx    Esophageal cancer Neg Hx    Liver disease Neg Hx    Inflammatory bowel disease Neg Hx    Social History   Tobacco Use   Smoking status: Former    Packs/day: 1.00    Years: 40.00    Total pack years: 40.00    Types: Cigarettes    Start date: 10/16/1980    Quit date: 2023    Years since quitting: 1.0   Smokeless tobacco: Never   Tobacco comments:    Has smoked off and on since the age of 47, quit while pregnant twice and also quit in 2017 but restarted in 2022  Vaping Use   Vaping Use: Never used  Substance Use Topics   Alcohol use: Not Currently    Alcohol/week: 6.0 standard drinks of alcohol    Types: 6 Standard drinks or equivalent per week    Comment: wine/liquor 2 x a month   Drug use: No     Pertinent Clinical Results:  LABS: Labs reviewed: Acceptable for surgery.  Lab Results  Component Value Date   WBC 7.8 03/02/2022   HGB 12.7 03/02/2022   HCT 38.5 03/02/2022   MCV 98.2 03/02/2022   PLT 191 03/02/2022   Lab Results  Component Value Date   NA 137 03/12/2022   K 3.0 (L) 03/12/2022  CO2 21 (L) 03/12/2022   GLUCOSE 121 (H) 03/12/2022   BUN 11 03/12/2022   CREATININE 0.85 03/12/2022   CALCIUM 8.9 03/12/2022   GFRNONAA >60 03/12/2022    ECG: Date: 03/02/2022 Time ECG obtained: 2011 PM Rate: 80 bpm Rhythm: normal sinus Axis (leads I and aVF): Normal Intervals: PR 158 ms. QRS 86 ms. QTc 449 ms. ST segment and T wave changes: No evidence of acute ST segment elevation or depression Comparison: Similar to previous tracing obtained on 01/19/2021   IMAGING / PROCEDURES: TRANSTHORACIC ECHOCARDIOGRAM performed on 03/12/2022 Left ventricular ejection fraction, by estimation, is 60 to 65%. The left ventricle has normal function. The left ventricle has no regional wall motion abnormalities. Left ventricular diastolic parameters were normal. Right ventricular systolic function is normal. The right ventricular size is normal.  The mitral valve is normal in structure. Trivial mitral valve regurgitation. No evidence of mitral stenosis.  The aortic valve is tricuspid. Aortic valve regurgitation is trivial. No aortic stenosis is present.  The inferior vena cava is normal in size with greater than 50% respiratory variability, suggesting right atrial pressure of 3 mmHg.   DIAGNOSTIC RADIOGRAPHS OF CHEST performed on 03/02/2022 The heart size and mediastinal contours are within normal limits. Both lungs are clear. No visible pleural effusions or pneumothorax. No acute osseous abnormality. No active cardiopulmonary disease.  MR SHOULDER RIGHT WO CONTRAST performed on 01/17/2022 Moderate tendinosis of the supraspinatus tendon with a 11 mm full-thickness tear and 10 mm of  retraction. Mild tendinosis of the infraspinatus tendon with a tiny partial-thickness articular surface tear. Severe tendinosis of the intra-articular portion of the long head of the biceps tendon.  CT CORONARY MORPH W/CTA COR W/SCORE W/CA W/CM &/OR WO/CM performed on 02/03/2021 Minimal nonobstructive CAD, CADRADS = 1. Coronary calcium score of 1. This was 72nd percentile for age and sex matched control. Normal coronary origin with right dominance.  MYOCARDIAL PERFUSION IMAGING STUDY (LEXISCAN) performed on 12/18/2015 No reversible ischemia or infarction. Normal left ventricular wall motion. Left ventricular ejection fraction 63% Non invasive risk stratification: Low  Impression and Plan:  [name] White has been referred for pre-anesthesia review and clearance prior to her undergoing the planned anesthetic and procedural courses. Available labs, pertinent testing, and imaging results were personally reviewed by me. This patient has been appropriately cleared by cardiology with an overall ACCEPTABLE risk of significant perioperative cardiovascular complications.  Based on clinical review performed today (03/16/22), barring any significant acute changes in the patient's overall condition, it is anticipated that she will be able to proceed with the planned surgical intervention. Any acute changes in clinical condition may necessitate her procedure being postponed and/or cancelled. Patient will meet with anesthesia team (MD and/or CRNA) on the day of her procedure for preoperative evaluation/assessment. Questions regarding anesthetic course will be fielded at that time.   Pre-surgical instructions were reviewed with the patient during her PAT appointment and questions were fielded by PAT clinical staff. Patient was advised that if any questions or concerns arise prior to her procedure then she should return a call to PAT and/or her surgeon's office to discuss.  Honor Loh, MSN, APRN, FNP-C,  CEN [facility]  Peri-operative Services Nurse Practitioner Phone: [phone] Fax: [phone] 03/16/22 8:37 AM  NOTE: This note has been prepared using Dragon dictation software. Despite my best ability to proofread, there is always the potential that unintentional transcriptional errors may still occur from this process.

## 2022-03-12 ENCOUNTER — Ambulatory Visit (HOSPITAL_COMMUNITY): Payer: PPO | Attending: Cardiovascular Disease

## 2022-03-12 ENCOUNTER — Encounter
Admission: RE | Admit: 2022-03-12 | Discharge: 2022-03-12 | Disposition: A | Discharge status: Home / Self Care | Payer: PPO | Source: Ambulatory Visit | Attending: Surgery | Admitting: Surgery

## 2022-03-12 ENCOUNTER — Other Ambulatory Visit (HOSPITAL_COMMUNITY): Payer: Self-pay

## 2022-03-12 DIAGNOSIS — E119 Type 2 diabetes mellitus without complications: Secondary | ICD-10-CM | POA: Insufficient documentation

## 2022-03-12 DIAGNOSIS — Z01812 Encounter for preprocedural laboratory examination: Secondary | ICD-10-CM | POA: Diagnosis not present

## 2022-03-12 DIAGNOSIS — R0609 Other forms of dyspnea: Secondary | ICD-10-CM

## 2022-03-12 DIAGNOSIS — Z79899 Other long term (current) drug therapy: Secondary | ICD-10-CM | POA: Insufficient documentation

## 2022-03-12 DIAGNOSIS — E876 Hypokalemia: Secondary | ICD-10-CM

## 2022-03-12 LAB — ECHOCARDIOGRAM COMPLETE
Area-P 1/2: 3.46 cm2
S' Lateral: 3.2 cm

## 2022-03-12 LAB — BASIC METABOLIC PANEL
Anion gap: 8 (ref 5–15)
BUN: 11 mg/dL (ref 6–20)
CO2: 21 mmol/L — ABNORMAL LOW (ref 22–32)
Calcium: 8.9 mg/dL (ref 8.9–10.3)
Chloride: 108 mmol/L (ref 98–111)
Creatinine, Ser: 0.85 mg/dL (ref 0.44–1.00)
GFR, Estimated: >60 mL/min (ref >60)
Glucose, Bld: 121 mg/dL — ABNORMAL HIGH (ref 70–99)
Potassium: 3 mmol/L — ABNORMAL LOW (ref 3.5–5.1)
Sodium: 137 mmol/L (ref 135–145)

## 2022-03-12 MED ORDER — HYDROMORPHONE HCL 2 MG PO TABS
2.0000 mg | ORAL_TABLET | Freq: Every day | ORAL | 0 refills | Status: DC | PRN
  Filled 2022-03-12: qty 115, 23d supply, fill #0

## 2022-03-12 MED ORDER — POTASSIUM CHLORIDE CRYS ER 20 MEQ PO TBCR: EXTENDED_RELEASE_TABLET | ORAL | 0 refills | Status: DC

## 2022-03-12 NOTE — Progress Notes (Signed)
[facility] Perioperative Services: Pre-Admission/Anesthesia Testing  Abnormal Lab Notification and Treatment Plan of Care   Date: 03/12/22  Name: [name] White MRN:   [id number]  Re: Abnormal labs noted during PAT appointment   Notified:  Provider Name Provider Role Notification Mode  Poggi, [name], MD Orthopedics (Surgeon) Routed and/or faxed via Annamaria Boots, [place], [place] Primary Care Provider Routed and/or faxed via [place] and Notes:  ABNORMAL LAB VALUE(S): Lab Results  Component Value Date   K 3.0 (L) 03/12/2022   [name] White is scheduled for an elective SHOULDER ARTHROSCOPY WITH DEBRIDEMENT, DECOMPRESSION, ROTATOR CUFF REPAIR AND BICEPS TENODESIS (Right: Shoulder) on 03/18/2022. In review of her medication reconciliation, it is noted that the patient is taking prescribed diuretic medications (furosemide).   Please note, in efforts to promote a safe and effective anesthetic course, per current guidelines/standards set by the [facility] anesthesia team, the minimal acceptable K+ level for the patient to proceed with general anesthesia is 3.0 mmol/L. With that being said, if the patient drops any lower, her elective procedure will need to be postponed until K+ is better optimized. In efforts to prevent [place] cancellation, will make efforts to optimize pre-surgical K+ level so that patient can safely undergo the planned surgical intervention.   Impression and Plan:  [name] White found to be HYPOkalemic at 3.0 mmol/L on preoperative labs. She is on loop diuretic therapy. No daily or PRN K+ supplements ordered Communicated with patient to discuss results and plans for correction of noted electrolyte derangement as follows:  Meds ordered this encounter  Medications   potassium chloride SA (KLOR-CON M) 20 MEQ tablet    Sig: Take 2 tablets (40 mEq) today, then 1 tablet (20 mEq) daily until surgery. Be sure to take dose of day of surgery. Follow up  with PCP for lab recheck.    Dispense:  7 tablet    Refill:  0   Encouraged patient to follow up with PCP about 3 weeks postoperatively to have labs rechecked to ensure that levels are remaining within normal range. Discussed nutritional intake of K+ rich foods as an adjunctive way to keep her K+ levels normal; list of K+ rich foods verbally provided. Also mentioned ORS, however advised her not to rely solely on these drinks, as they are high in Na+, and she has issues with fluid retention.   Will send copy of this note to surgeon and PCP to make them aware of K+ level and plans for correction. Discussed that PCP may elect to add a PRN K+ supplement for patient to use when she has to use her furosemide. Order entered to recheck K+ on the day of her surgery to ensure optimization.  Patient verbalized understanding of POC as it stands at this point. Wished patient the best of luck with her upcoming surgery and subsequent recovery. She was encouraged to return call to the PAT clinic, or to her surgeon's office, should any questions or concerns arise between now and the time of her surgery. Patient was appreciative of the care/concern expressed by PAT staff.   Encounter Diagnoses  Name Primary?   Pre-operative laboratory examination Yes   Diuretic-induced hypokalemia    Honor Loh, MSN, APRN, FNP-C, CEN [facility]  Peri-operative Services Nurse Practitioner Phone: [phone] 03/12/22 11:09 PM  NOTE: This note has been prepared using Dragon dictation software. Despite my best ability to proofread, there is always the potential that unintentional transcriptional  errors may still occur from this process.

## 2022-03-15 ENCOUNTER — Other Ambulatory Visit (HOSPITAL_COMMUNITY): Payer: Self-pay

## 2022-03-18 ENCOUNTER — Other Ambulatory Visit: Payer: Self-pay

## 2022-03-18 ENCOUNTER — Ambulatory Visit: Payer: PPO | Admitting: Urgent Care

## 2022-03-18 ENCOUNTER — Encounter
Admission: RE | Disposition: A | Discharge status: Home / Self Care | Payer: Self-pay | Source: Home / Self Care | Attending: Surgery

## 2022-03-18 ENCOUNTER — Encounter: Payer: Self-pay | Admitting: Surgery

## 2022-03-18 ENCOUNTER — Other Ambulatory Visit (HOSPITAL_COMMUNITY): Payer: Self-pay

## 2022-03-18 ENCOUNTER — Ambulatory Visit: Payer: PPO

## 2022-03-18 ENCOUNTER — Ambulatory Visit
Admission: RE | Admit: 2022-03-18 | Discharge: 2022-03-18 | Disposition: A | Discharge status: Home / Self Care | Payer: PPO | Attending: Surgery | Admitting: Surgery

## 2022-03-18 DIAGNOSIS — F419 Anxiety disorder, unspecified: Secondary | ICD-10-CM | POA: Diagnosis not present

## 2022-03-18 DIAGNOSIS — K3184 Gastroparesis: Secondary | ICD-10-CM | POA: Insufficient documentation

## 2022-03-18 DIAGNOSIS — M19011 Primary osteoarthritis, right shoulder: Secondary | ICD-10-CM | POA: Diagnosis not present

## 2022-03-18 DIAGNOSIS — M659 Synovitis and tenosynovitis, unspecified: Secondary | ICD-10-CM | POA: Insufficient documentation

## 2022-03-18 DIAGNOSIS — G894 Chronic pain syndrome: Secondary | ICD-10-CM | POA: Diagnosis not present

## 2022-03-18 DIAGNOSIS — Z79891 Long term (current) use of opiate analgesic: Secondary | ICD-10-CM | POA: Insufficient documentation

## 2022-03-18 DIAGNOSIS — M7521 Bicipital tendinitis, right shoulder: Secondary | ICD-10-CM | POA: Insufficient documentation

## 2022-03-18 DIAGNOSIS — K219 Gastro-esophageal reflux disease without esophagitis: Secondary | ICD-10-CM | POA: Diagnosis not present

## 2022-03-18 DIAGNOSIS — G709 Myoneural disorder, unspecified: Secondary | ICD-10-CM | POA: Diagnosis not present

## 2022-03-18 DIAGNOSIS — G8918 Other acute postprocedural pain: Secondary | ICD-10-CM | POA: Diagnosis not present

## 2022-03-18 DIAGNOSIS — M25811 Other specified joint disorders, right shoulder: Secondary | ICD-10-CM | POA: Diagnosis not present

## 2022-03-18 DIAGNOSIS — Z01818 Encounter for other preprocedural examination: Secondary | ICD-10-CM

## 2022-03-18 DIAGNOSIS — E1143 Type 2 diabetes mellitus with diabetic autonomic (poly)neuropathy: Secondary | ICD-10-CM | POA: Insufficient documentation

## 2022-03-18 DIAGNOSIS — Z87891 Personal history of nicotine dependence: Secondary | ICD-10-CM | POA: Diagnosis not present

## 2022-03-18 DIAGNOSIS — M7581 Other shoulder lesions, right shoulder: Secondary | ICD-10-CM | POA: Diagnosis not present

## 2022-03-18 DIAGNOSIS — Z7984 Long term (current) use of oral hypoglycemic drugs: Secondary | ICD-10-CM | POA: Diagnosis not present

## 2022-03-18 DIAGNOSIS — M75111 Incomplete rotator cuff tear or rupture of right shoulder, not specified as traumatic: Secondary | ICD-10-CM | POA: Diagnosis not present

## 2022-03-18 DIAGNOSIS — F319 Bipolar disorder, unspecified: Secondary | ICD-10-CM | POA: Insufficient documentation

## 2022-03-18 DIAGNOSIS — I2510 Atherosclerotic heart disease of native coronary artery without angina pectoris: Secondary | ICD-10-CM | POA: Diagnosis not present

## 2022-03-18 DIAGNOSIS — S46101A Unspecified injury of muscle, fascia and tendon of long head of biceps, right arm, initial encounter: Secondary | ICD-10-CM | POA: Diagnosis not present

## 2022-03-18 DIAGNOSIS — G4733 Obstructive sleep apnea (adult) (pediatric): Secondary | ICD-10-CM | POA: Insufficient documentation

## 2022-03-18 DIAGNOSIS — Z79899 Other long term (current) drug therapy: Secondary | ICD-10-CM | POA: Diagnosis not present

## 2022-03-18 DIAGNOSIS — M7541 Impingement syndrome of right shoulder: Secondary | ICD-10-CM | POA: Diagnosis not present

## 2022-03-18 DIAGNOSIS — E876 Hypokalemia: Secondary | ICD-10-CM

## 2022-03-18 DIAGNOSIS — F4310 Post-traumatic stress disorder, unspecified: Secondary | ICD-10-CM

## 2022-03-18 DIAGNOSIS — Z01812 Encounter for preprocedural laboratory examination: Secondary | ICD-10-CM

## 2022-03-18 DIAGNOSIS — M75121 Complete rotator cuff tear or rupture of right shoulder, not specified as traumatic: Secondary | ICD-10-CM | POA: Diagnosis not present

## 2022-03-18 HISTORY — DX: Polyp of colon: K63.5

## 2022-03-18 HISTORY — PX: SHOULDER OPEN ROTATOR CUFF REPAIR: SHX2407

## 2022-03-18 HISTORY — DX: Hyperlipidemia, unspecified: E78.5

## 2022-03-18 HISTORY — DX: Opioid use, unspecified, uncomplicated: F11.90

## 2022-03-18 HISTORY — DX: Other forms of dyspnea: R06.09

## 2022-03-18 HISTORY — DX: Generalized anxiety disorder: F41.1

## 2022-03-18 HISTORY — DX: Palpitations: R00.2

## 2022-03-18 HISTORY — DX: Bipolar disorder, unspecified: F31.9

## 2022-03-18 HISTORY — PX: SHOULDER ARTHROSCOPY WITH SUBACROMIAL DECOMPRESSION, ROTATOR CUFF REPAIR AND BICEP TENDON REPAIR: SHX5687

## 2022-03-18 HISTORY — DX: Panic disorder (episodic paroxysmal anxiety): F41.0

## 2022-03-18 LAB — POCT I-STAT, CHEM 8
BUN: 10 mg/dL (ref 6–20)
Calcium, Ion: 1.18 mmol/L (ref 1.15–1.40)
Chloride: 102 mmol/L (ref 98–111)
Creatinine, Ser: 0.9 mg/dL (ref 0.44–1.00)
Glucose, Bld: 97 mg/dL (ref 70–99)
HCT: 39 % (ref 36.0–46.0)
Hemoglobin: 13.3 g/dL (ref 12.0–15.0)
Potassium: 3.7 mmol/L (ref 3.5–5.1)
Sodium: 138 mmol/L (ref 135–145)
TCO2: 24 mmol/L (ref 22–32)

## 2022-03-18 LAB — GLUCOSE, CAPILLARY: Glucose-Capillary: 103 mg/dL — ABNORMAL HIGH (ref 70–99)

## 2022-03-18 SURGERY — SHOULDER ARTHROSCOPY WITH SUBACROMIAL DECOMPRESSION, ROTATOR CUFF REPAIR AND BICEP TENDON REPAIR
Anesthesia: General | Site: Shoulder | Laterality: Right

## 2022-03-18 MED ORDER — EPINEPHRINE PF 1 MG/ML IJ SOLN
INTRAMUSCULAR | Status: AC
  Filled 2022-03-18: qty 4

## 2022-03-18 MED ORDER — BUPIVACAINE-EPINEPHRINE (PF) 0.5% -1:200000 IJ SOLN
INTRAMUSCULAR | Status: AC
  Filled 2022-03-18: qty 30

## 2022-03-18 MED ORDER — VANCOMYCIN HCL 1500 MG/300ML IV SOLN
1500.0000 mg | INTRAVENOUS | Status: AC
  Administered 2022-03-18 (×2): 1500 mg via INTRAVENOUS
  Filled 2022-03-18 (×2): qty 300

## 2022-03-18 MED ORDER — FUROSEMIDE 20 MG PO TABS: 20.0000 mg | ORAL_TABLET | ORAL | Status: DC | PRN

## 2022-03-18 MED ORDER — OXYCODONE HCL 5 MG PO TABS
5.0000 mg | ORAL_TABLET | ORAL | 0 refills | Status: DC | PRN
  Filled 2022-03-18: qty 40, 4d supply, fill #0

## 2022-03-18 MED ORDER — TOPIRAMATE 200 MG PO TABS: 200.0000 mg | ORAL_TABLET | Freq: Every evening | ORAL | Status: DC

## 2022-03-18 MED ORDER — LACTATED RINGERS IV SOLN: INTRAVENOUS | Status: DC | PRN

## 2022-03-18 MED ORDER — BUPIVACAINE HCL (PF) 0.5 % IJ SOLN
INTRAMUSCULAR | Status: DC | PRN
  Administered 2022-03-18: 5 mL via PERINEURAL

## 2022-03-18 MED ORDER — OXYCODONE HCL 5 MG PO TABS: 5.0000 mg | ORAL_TABLET | ORAL | Status: DC | PRN

## 2022-03-18 MED ORDER — ROCURONIUM BROMIDE 100 MG/10ML IV SOLN
INTRAVENOUS | Status: DC | PRN
  Administered 2022-03-18: 50 mg via INTRAVENOUS

## 2022-03-18 MED ORDER — ACETAMINOPHEN 325 MG PO TABS: 325.0000 mg | ORAL_TABLET | ORAL | Status: DC | PRN

## 2022-03-18 MED ORDER — DICYCLOMINE HCL 10 MG PO CAPS: 10.0000 mg | ORAL_CAPSULE | ORAL | Status: DC | PRN

## 2022-03-18 MED ORDER — MIDAZOLAM HCL 2 MG/2ML IJ SOLN: 1.0000 mg | Freq: Once | INTRAMUSCULAR | Status: AC

## 2022-03-18 MED ORDER — SUGAMMADEX SODIUM 200 MG/2ML IV SOLN
INTRAVENOUS | Status: DC | PRN
  Administered 2022-03-18: 200 mg via INTRAVENOUS

## 2022-03-18 MED ORDER — ACETAMINOPHEN 160 MG/5ML PO SOLN: 325.0000 mg | ORAL | Status: DC | PRN

## 2022-03-18 MED ORDER — FENTANYL CITRATE PF 50 MCG/ML IJ SOSY: 50.0000 ug | PREFILLED_SYRINGE | Freq: Once | INTRAMUSCULAR | Status: AC

## 2022-03-18 MED ORDER — DROPERIDOL 2.5 MG/ML IJ SOLN: 0.6250 mg | Freq: Once | INTRAMUSCULAR | Status: DC | PRN

## 2022-03-18 MED ORDER — ACETAMINOPHEN 10 MG/ML IV SOLN
INTRAVENOUS | Status: DC | PRN
  Administered 2022-03-18: 1000 mg via INTRAVENOUS

## 2022-03-18 MED ORDER — PROPOFOL 10 MG/ML IV BOLUS
INTRAVENOUS | Status: DC | PRN
  Administered 2022-03-18: 170 mg via INTRAVENOUS

## 2022-03-18 MED ORDER — PRAVASTATIN SODIUM 40 MG PO TABS: 40.0000 mg | ORAL_TABLET | Freq: Every evening | ORAL | Status: DC

## 2022-03-18 MED ORDER — BUPIVACAINE-EPINEPHRINE 0.5% -1:200000 IJ SOLN
INTRAMUSCULAR | Status: DC | PRN
  Administered 2022-03-18: 30 mL

## 2022-03-18 MED ORDER — MIDAZOLAM HCL 2 MG/2ML IJ SOLN
INTRAMUSCULAR | Status: AC
  Filled 2022-03-18: qty 2

## 2022-03-18 MED ORDER — OXYCODONE HCL 5 MG PO TABS: 5.0000 mg | ORAL_TABLET | ORAL | 0 refills | Status: DC | PRN

## 2022-03-18 MED ORDER — VANCOMYCIN HCL IN DEXTROSE 1-5 GM/200ML-% IV SOLN
INTRAVENOUS | Status: AC
  Filled 2022-03-18: qty 200

## 2022-03-18 MED ORDER — CHLORHEXIDINE GLUCONATE 0.12 % MT SOLN: 15.0000 mL | Freq: Once | OROMUCOSAL | Status: AC

## 2022-03-18 MED ORDER — POTASSIUM CHLORIDE IN NACL 20-0.9 MEQ/L-% IV SOLN: INTRAVENOUS | Status: DC

## 2022-03-18 MED ORDER — ONDANSETRON HCL 4 MG/2ML IJ SOLN: 4.0000 mg | Freq: Once | INTRAMUSCULAR | Status: DC | PRN

## 2022-03-18 MED ORDER — MIDAZOLAM HCL 2 MG/2ML IJ SOLN
1.0000 mg | Freq: Once | INTRAMUSCULAR | Status: AC
  Administered 2022-03-18: 1 mg via INTRAVENOUS

## 2022-03-18 MED ORDER — EPHEDRINE SULFATE (PRESSORS) 50 MG/ML IJ SOLN
INTRAMUSCULAR | Status: DC | PRN
  Administered 2022-03-18: 10 mg via INTRAVENOUS
  Administered 2022-03-18: 5 mg via INTRAVENOUS
  Administered 2022-03-18 (×2): 10 mg via INTRAVENOUS

## 2022-03-18 MED ORDER — BUPIVACAINE HCL (PF) 0.5 % IJ SOLN
INTRAMUSCULAR | Status: AC
  Filled 2022-03-18: qty 10

## 2022-03-18 MED ORDER — SODIUM CHLORIDE 0.9 % IR SOLN
Status: DC | PRN
  Administered 2022-03-18: 3001 mL

## 2022-03-18 MED ORDER — ORAL CARE MOUTH RINSE: 15.0000 mL | Freq: Once | OROMUCOSAL | Status: AC

## 2022-03-18 MED ORDER — PHENYLEPHRINE HCL-NACL 20-0.9 MG/250ML-% IV SOLN
INTRAVENOUS | Status: DC | PRN
  Administered 2022-03-18: 80 ug/min via INTRAVENOUS

## 2022-03-18 MED ORDER — MIDAZOLAM HCL 2 MG/2ML IJ SOLN
INTRAMUSCULAR | Status: DC | PRN
  Administered 2022-03-18: 2 mg via INTRAVENOUS

## 2022-03-18 MED ORDER — ONDANSETRON HCL 4 MG PO TABS: 4.0000 mg | ORAL_TABLET | Freq: Four times a day (QID) | ORAL | Status: DC | PRN

## 2022-03-18 MED ORDER — TRAZODONE HCL 100 MG PO TABS: 200.0000 mg | ORAL_TABLET | Freq: Every day | ORAL | Status: DC

## 2022-03-18 MED ORDER — PHENYLEPHRINE HCL (PRESSORS) 10 MG/ML IV SOLN
INTRAVENOUS | Status: DC | PRN
  Administered 2022-03-18 (×3): 160 ug via INTRAVENOUS

## 2022-03-18 MED ORDER — ONDANSETRON HCL 4 MG/2ML IJ SOLN
INTRAMUSCULAR | Status: DC | PRN
  Administered 2022-03-18: 4 mg via INTRAVENOUS

## 2022-03-18 MED ORDER — FENTANYL CITRATE (PF) 100 MCG/2ML IJ SOLN
INTRAMUSCULAR | Status: DC | PRN
  Administered 2022-03-18: 100 ug via INTRAVENOUS

## 2022-03-18 MED ORDER — SODIUM CHLORIDE 0.9 % IV SOLN: INTRAVENOUS | Status: DC

## 2022-03-18 MED ORDER — HYDROCODONE-ACETAMINOPHEN 7.5-325 MG PO TABS
1.0000 | ORAL_TABLET | Freq: Once | ORAL | Status: DC | PRN
  Filled 2022-03-18: qty 1

## 2022-03-18 MED ORDER — DEXAMETHASONE SODIUM PHOSPHATE 10 MG/ML IJ SOLN
INTRAMUSCULAR | Status: DC | PRN
  Administered 2022-03-18: 10 mg via INTRAVENOUS

## 2022-03-18 MED ORDER — METOCLOPRAMIDE HCL 10 MG PO TABS: 5.0000 mg | ORAL_TABLET | Freq: Three times a day (TID) | ORAL | Status: DC | PRN

## 2022-03-18 MED ORDER — BUPIVACAINE LIPOSOME 1.3 % IJ SUSP
INTRAMUSCULAR | Status: DC | PRN
  Administered 2022-03-18: 15 mL via PERINEURAL

## 2022-03-18 MED ORDER — MIDAZOLAM HCL 2 MG/2ML IJ SOLN
INTRAMUSCULAR | Status: AC
  Administered 2022-03-18: 1 mg via INTRAVENOUS
  Filled 2022-03-18: qty 2

## 2022-03-18 MED ORDER — LIDOCAINE HCL (CARDIAC) PF 100 MG/5ML IV SOSY
PREFILLED_SYRINGE | INTRAVENOUS | Status: DC | PRN
  Administered 2022-03-18: 100 mg via INTRAVENOUS

## 2022-03-18 MED ORDER — METOCLOPRAMIDE HCL 5 MG/ML IJ SOLN: 5.0000 mg | Freq: Three times a day (TID) | INTRAMUSCULAR | Status: DC | PRN

## 2022-03-18 MED ORDER — CHLORHEXIDINE GLUCONATE 0.12 % MT SOLN
OROMUCOSAL | Status: AC
  Administered 2022-03-18: 15 mL via OROMUCOSAL
  Filled 2022-03-18: qty 15

## 2022-03-18 MED ORDER — BUPIVACAINE LIPOSOME 1.3 % IJ SUSP
INTRAMUSCULAR | Status: AC
  Filled 2022-03-18: qty 10

## 2022-03-18 MED ORDER — FENTANYL CITRATE PF 50 MCG/ML IJ SOSY
PREFILLED_SYRINGE | INTRAMUSCULAR | Status: AC
  Administered 2022-03-18: 50 ug via INTRAVENOUS
  Filled 2022-03-18: qty 1

## 2022-03-18 MED ORDER — PANTOPRAZOLE SODIUM 40 MG PO TBEC: 40.0000 mg | DELAYED_RELEASE_TABLET | Freq: Every evening | ORAL | Status: DC

## 2022-03-18 MED ORDER — ONDANSETRON HCL 4 MG/2ML IJ SOLN: 4.0000 mg | Freq: Four times a day (QID) | INTRAMUSCULAR | Status: DC | PRN

## 2022-03-18 MED ORDER — FENTANYL CITRATE (PF) 100 MCG/2ML IJ SOLN
INTRAMUSCULAR | Status: AC
  Filled 2022-03-18: qty 2

## 2022-03-18 SURGICAL SUPPLY — 59 items
ANCH SUT 2 2/0 ABS BRD STRL (Anchor) ×1 IMPLANT
ANCH SUT BN ASCP DLV (Anchor) ×1 IMPLANT
ANCH SUT RGNRT REGENETEN (Staple) ×1 IMPLANT
ANCHOR BONE REGENETEN (Anchor) IMPLANT
ANCHOR HEALICOIL REGEN 5.5 (Anchor) IMPLANT
ANCHOR JUGGERKNOT WTAP NDL 2.9 (Anchor) IMPLANT
ANCHOR SUT QUATTRO KNTLS 4.5 (Anchor) IMPLANT
ANCHOR SUT W/ ORTHOCORD (Anchor) IMPLANT
ANCHOR TENDON REGENETEN (Staple) IMPLANT
APL PRP STRL LF DISP 70% ISPRP (MISCELLANEOUS) ×1
BIT DRILL JUGRKNT W/NDL BIT2.9 (DRILL) IMPLANT
BLADE FULL RADIUS 3.5 (BLADE) ×1 IMPLANT
BUR ACROMIONIZER 4.0 (BURR) ×1 IMPLANT
CANNULA SHAVER 8MMX76MM (CANNULA) ×1 IMPLANT
CHLORAPREP W/TINT 26 (MISCELLANEOUS) ×1 IMPLANT
COVER MAYO STAND REUSABLE (DRAPES) ×1 IMPLANT
DILATOR 5.5 THREADED HEALICOIL (MISCELLANEOUS) IMPLANT
DRILL JUGGERKNOT W/NDL BIT 2.9 (DRILL)
DRSG XEROFORM 1X8 (GAUZE/BANDAGES/DRESSINGS) IMPLANT
ELECT CAUTERY BLADE 6.4 (BLADE) ×1 IMPLANT
ELECT REM PT RETURN 9FT ADLT (ELECTROSURGICAL) ×1
ELECTRODE REM PT RTRN 9FT ADLT (ELECTROSURGICAL) ×1 IMPLANT
GAUZE SPONGE 4X4 12PLY STRL (GAUZE/BANDAGES/DRESSINGS) ×1 IMPLANT
GLOVE BIO SURGEON STRL SZ7.5 (GLOVE) ×2 IMPLANT
GLOVE BIO SURGEON STRL SZ8 (GLOVE) ×2 IMPLANT
GLOVE BIOGEL PI IND STRL 8 (GLOVE) ×1 IMPLANT
GLOVE SURG UNDER LTX SZ8 (GLOVE) ×1 IMPLANT
GOWN STRL REUS W/ TWL LRG LVL3 (GOWN DISPOSABLE) ×1 IMPLANT
GOWN STRL REUS W/ TWL XL LVL3 (GOWN DISPOSABLE) ×1 IMPLANT
GOWN STRL REUS W/TWL LRG LVL3 (GOWN DISPOSABLE) ×1
GOWN STRL REUS W/TWL XL LVL3 (GOWN DISPOSABLE) ×1
GRASPER SUT 15 45D LOW PRO (SUTURE) IMPLANT
IMPL REGENETEN MEDIUM (Shoulder) IMPLANT
IMPLANT REGENETEN MEDIUM (Shoulder) ×1 IMPLANT
IV LACTATED RINGER IRRG 3000ML (IV SOLUTION) ×1
IV LR IRRIG 3000ML ARTHROMATIC (IV SOLUTION) ×2 IMPLANT
KIT CANNULA 8X76-LX IN CANNULA (CANNULA) ×1 IMPLANT
MANIFOLD NEPTUNE II (INSTRUMENTS) ×2 IMPLANT
MASK FACE SPIDER DISP (MASK) ×1 IMPLANT
MAT ABSORB  FLUID 56X50 GRAY (MISCELLANEOUS) ×1
MAT ABSORB FLUID 56X50 GRAY (MISCELLANEOUS) ×1 IMPLANT
PACK ARTHROSCOPY SHOULDER (MISCELLANEOUS) ×1 IMPLANT
PAD ABD DERMACEA PRESS 5X9 (GAUZE/BANDAGES/DRESSINGS) IMPLANT
PASSER SUT FIRSTPASS SELF (INSTRUMENTS) IMPLANT
SLING ARM LRG DEEP (SOFTGOODS) ×1 IMPLANT
SLING ULTRA II LG (MISCELLANEOUS) ×1 IMPLANT
SPONGE T-LAP 18X18 ~~LOC~~+RFID (SPONGE) ×1 IMPLANT
STAPLER SKIN PROX 35W (STAPLE) ×1 IMPLANT
STRAP SAFETY 5IN WIDE (MISCELLANEOUS) ×1 IMPLANT
SUT ETHIBOND 0 MO6 C/R (SUTURE) ×1 IMPLANT
SUT ULTRABRAID 2 COBRAID 38 (SUTURE) IMPLANT
SUT VIC AB 2-0 CT1 27 (SUTURE) ×2
SUT VIC AB 2-0 CT1 TAPERPNT 27 (SUTURE) ×2 IMPLANT
TAPE MICROFOAM 4IN (TAPE) ×1 IMPLANT
TRAP FLUID SMOKE EVACUATOR (MISCELLANEOUS) ×1 IMPLANT
TUBING CONNECTING 10 (TUBING) ×1 IMPLANT
TUBING INFLOW SET DBFLO PUMP (TUBING) ×1 IMPLANT
WAND WEREWOLF FLOW 90D (MISCELLANEOUS) ×1 IMPLANT
WATER STERILE IRR 500ML POUR (IV SOLUTION) ×1 IMPLANT

## 2022-03-18 NOTE — Anesthesia Procedure Notes (Addendum)
Anesthesia Regional Block: Interscalene brachial plexus block   Pre-Anesthetic Checklist: , timeout performed,  Correct Patient, Correct Site, Correct Laterality,  Correct Procedure, Correct Position, site marked,  Risks and benefits discussed,  Surgical consent,  Pre-op evaluation,  At surgeon's request and post-op pain management  Laterality: Upper and Right  Prep: chloraprep       Needles:  Injection technique: Single-shot  Needle Type: Echogenic Stimulator Needle     Needle Length: 10cm  Needle Gauge: 20   Needle insertion depth: 7 cm   Additional Needles:   Procedures: Doppler guided,,,, ultrasound used (permanent image in chart),,   Motor weakness within 7 minutes.  Narrative:  Start time: 03/18/2022 9:30 AM End time: 03/18/2022 9:38 AM Injection made incrementally with aspirations every 5 mL.  Performed by: Personally  Anesthesiologist: Alphonsus Sias, MD  Additional Notes: Functioning IV was confirmed and O2 [place]/monitors were applied. Light sedation administered as required, patient responsive throughout. A 142m 20ga EchoStim needle was used. Sterile prep and drape,hand hygiene and sterile gloves were used.  Negative aspiration and negative test dose prior to incremental administration of local anesthetic. 1% Lidocaine for skin wheal,  320m Total LA: 2024m Exparel 65m83m0.5% Bupivicaine 5ml.9mS images stored in chart. The patient tolerated the procedure well.

## 2022-03-18 NOTE — Anesthesia Preprocedure Evaluation (Signed)
Anesthesia Evaluation  Patient identified by MRN, date of birth, ID band Patient awake    Reviewed: Allergy & Precautions, NPO status , Patient's Chart, lab work & pertinent test results  Airway Mallampati: III  TM Distance: >3 FB Neck ROM: full    Dental  (+) Chipped, Edentulous Upper, Missing   Pulmonary sleep apnea and Continuous Positive Airway Pressure Ventilation , former smoker   Pulmonary exam normal        Cardiovascular + CAD and + DOE  Normal cardiovascular exam  Seen by Cardiology preop op - cleared   Neuro/Psych  Headaches PSYCHIATRIC DISORDERS Anxiety Depression Bipolar Disorder    Neuromuscular disease    GI/Hepatic Neg liver ROS,GERD  Medicated and Controlled,,  Endo/Other  negative endocrine ROSdiabetes    Renal/GU      Musculoskeletal   Abdominal   Peds  Hematology negative hematology ROS (+)   Anesthesia Other Findings Past Medical History: No date: Anterolisthesis No date: Arthritis No date: Barrett's esophagus     Comment:  egd 2007 + 2009 but none 2011 or 08/2010 egd No date: Bipolar 1 disorder ([place]) 02/03/2021: CAD (coronary artery disease)     Comment:  a.) cCTA 02/03/2021: Ca score = 1 (72nd percentile for               age/sex match control) No date: Chronic, continuous use of opioids No date: Colon polyp, hyperplastic No date: Depression 53/97/6734: Diastolic dysfunction     Comment:  a.) TTE 02/06/2021: EF 55-60%, mild LVH, G1DD No date: DOE (dyspnea on exertion) No date: Family history of adverse reaction to anesthesia     Comment:  a.) delayed emergence in 1st degree relative (brother) No date: Gastroparesis No date: Generalized anxiety disorder with panic attacks No date: GERD (gastroesophageal reflux disease) No date: Grief reaction No date: Hidradenitis suppurativa No date: History of kidney stones No date: HLD (hyperlipidemia) No date: IBS (irritable bowel syndrome) No  date: Migraine No date: Morbid obesity ([place]) No date: OSA (obstructive sleep apnea)     Comment:  a.) did not tolerate nocturnal PAP therapy mainly due to              anxiety and associated xerostomia No date: Ovarian cyst No date: Palpitations No date: Plantar fasciitis No date: PTSD (post-traumatic stress disorder) No date: Restless leg     Comment:  a.) on ropinirole 2003: Suicide attempt ([place]) No date: Tobacco abuse No date: Type 2 diabetes mellitus treated without insulin ([place])  Past Surgical History: No date: ABDOMINAL HYSTERECTOMY     Comment:  secondary to endometriosis 2019: BACK SURGERY; Right     Comment:  lumbar ablation L3-S1 x 2  05/07/2021: BIOPSY     Comment:  Procedure: BIOPSY;  Surgeon: Rush Landmark Telford Nab.,               MD;  Location: [facility];  Service: Gastroenterology;; No date: CESAREAN SECTION     Comment:  x 2 No date: CHOLECYSTECTOMY 2012: COLONOSCOPY W/ POLYPECTOMY 05/01/2020: COLONOSCOPY WITH PROPOFOL; N/A     Comment:  Procedure: COLONOSCOPY WITH PROPOFOL;  Surgeon:               Irving Copas., MD;  Location: [facility];                Service: Gastroenterology;  Laterality: N/A; 05/07/2021: COLONOSCOPY WITH PROPOFOL; N/A     Comment:  Procedure: COLONOSCOPY WITH PROPOFOL;  Surgeon:  Mansouraty, Telford Nab., MD;  Location: [facility];                Service: Gastroenterology;  Laterality: N/A; No date: ELBOW SURGERY; Left 05/01/2020: ENDOSCOPIC MUCOSAL RESECTION; N/A     Comment:  Procedure: ENDOSCOPIC MUCOSAL RESECTION;  Surgeon:               Rush Landmark Telford Nab., MD;  Location: [facility];                Service: Gastroenterology;  Laterality: N/A; 05/07/2021: ENDOSCOPIC MUCOSAL RESECTION; N/A     Comment:  Procedure: ENDOSCOPIC MUCOSAL RESECTION;  Surgeon:               Rush Landmark, Telford Nab., MD;  Location: [facility];                Service: Gastroenterology;  Laterality: N/A; No date: GANGLION  CYST EXCISION; Right 05/01/2020: HEMOSTASIS CLIP PLACEMENT     Comment:  Procedure: HEMOSTASIS CLIP PLACEMENT;  Surgeon:               Irving Copas., MD;  Location: [facility];                Service: Gastroenterology;; 05/07/2021: HEMOSTASIS CLIP PLACEMENT     Comment:  Procedure: HEMOSTASIS CLIP PLACEMENT;  Surgeon:               Irving Copas., MD;  Location: [facility];                Service: Gastroenterology;; 05/07/2021: HOT HEMOSTASIS; N/A     Comment:  Procedure: HOT HEMOSTASIS (ARGON PLASMA               COAGULATION/BICAP);  Surgeon: Irving Copas.,               MD;  Location: [facility];  Service: Gastroenterology;               Laterality: N/A; 04/22/2010: LAPAROSCOPIC LYSIS OF ADHESIONS 04/30/2010: LAPAROSCOPY     Comment:  with mini laparotomy and cystotomy repair No date: PLANTAR FASCIA SURGERY; Right 05/01/2020: POLYPECTOMY     Comment:  Procedure: POLYPECTOMY;  Surgeon: Irving Copas., MD;  Location: [facility];  Service:               Gastroenterology;; 05/07/2021: POLYPECTOMY     Comment:  Procedure: POLYPECTOMY;  Surgeon: Irving Copas., MD;  Location: [facility];  Service:               Gastroenterology;; 04/22/2010: SALPINGECTOMY; Right 05/01/2020: SUBMUCOSAL LIFTING INJECTION     Comment:  Procedure: SUBMUCOSAL LIFTING INJECTION;  Surgeon:               Irving Copas., MD;  Location: [facility];                Service: Gastroenterology;; 05/07/2021: SUBMUCOSAL LIFTING INJECTION     Comment:  Procedure: SUBMUCOSAL LIFTING INJECTION;  Surgeon:               Irving Copas., MD;  Location: [facility];                Service: Gastroenterology;; 04/25/2017: TOTAL HIP ARTHROPLASTY; Right     Comment:  Procedure: RIGHT TOTAL HIP ARTHROPLASTY ANTERIOR  APPROACH;  Surgeon: Rod Can, MD;  Location: [facility];  Service: Orthopedics;   Laterality: Right;  Needs               RNFA No date: TUBAL LIGATION  BMI    Body Mass Index: 38.19 kg/m      Reproductive/Obstetrics negative OB ROS                             Anesthesia Physical Anesthesia Plan  ASA: 3  Anesthesia Plan: General ETT   Post-op Pain Management: Regional block*   Induction: Intravenous  PONV Risk Score and Plan: Ondansetron, Dexamethasone, Midazolam and Treatment may vary due to age or medical condition  Airway Management Planned: Oral ETT  Additional Equipment:   Intra-op Plan:   Post-operative Plan: Extubation in OR  Informed Consent: I have reviewed the patients History and Physical, chart, labs and discussed the procedure including the risks, benefits and alternatives for the proposed anesthesia with the patient or authorized representative who has indicated his/her understanding and acceptance.     Dental Advisory Given  Plan Discussed with: Anesthesiologist, CRNA and Surgeon  Anesthesia Plan Comments: (Patient consented for risks of anesthesia including but not limited to:  - adverse reactions to medications - damage to eyes, teeth, lips or other oral mucosa - nerve damage due to positioning  - sore throat or hoarseness - Damage to heart, brain, nerves, lungs, other parts of body or loss of life  Patient voiced understanding.)       Anesthesia Quick Evaluation

## 2022-03-18 NOTE — Transfer of Care (Signed)
Immediate Anesthesia Transfer of Care Note  Patient: [name] White  Procedure(s) Performed: SHOULDER ARTHROSCOPY WITH DEBRIDEMENT, DECOMPRESSION, ROTATOR CUFF REPAIR AND BICEPS TENOLYSIS (Right: Shoulder) OPEN ROTATOR CUFF REPAIR (Right: Shoulder)  Patient Location: PACU  Anesthesia Type:General  Level of Consciousness: awake  Airway & Oxygen Therapy: Patient Spontanous Breathing and Patient connected to face mask oxygen  Post-op Assessment: Report given to RN, Post -op Vital signs reviewed and stable, and Patient moving all extremities X 4  Post vital signs: Reviewed  Last Vitals:  Vitals Value Taken Time  BP 137/75 03/18/22 1218  Temp 55F   Pulse 107 03/18/22 1219  Resp 19 03/18/22 1218  SpO2 97 % 03/18/22 1219  Vitals shown include unvalidated device data.  Last Pain:  Vitals:   03/18/22 0939  TempSrc:   PainSc: 0-No pain         Complications: No notable events documented.

## 2022-03-18 NOTE — Op Note (Signed)
03/18/2022  11:47 AM  Patient:   [name] White  Pre-Op Diagnosis:   Impingement/tendinopathy with rotator cuff tear and biceps tendinopathy, right shoulder.  Post-Op Diagnosis:   Impingement/tendinopathy with partial-thickness rotator cuff tears, degenerative labral fraying, and biceps tendinopathy, right shoulder.  Procedure:   Extensive arthroscopic debridement, arthroscopic repair of partial-thickness subscapularis tendon tear, arthroscopic subacromial decompression, and mini-open repair of partial-thickness supraspinatus tear with Regeneten patch, right shoulder.  Anesthesia:   General endotracheal with interscalene block using Exparel placed preoperatively by the anesthesiologist.  Surgeon:   Pascal Lux, MD  Assistant:   Cameron Proud, PA-C  Findings:   As above.  There was moderate labral fraying involving the superior and posterior superior portions of the labrum without frank detachment from the glenoid rim.  There was an articular-sided partial-thickness tear involving the superior insertional fibers of the subscapularis tendon with approximately 25% compromise of the footprint.  There also was an articular-sided partial-thickness tear involving the anterior and mid-insertional fibers of the supraspinatus tendon involving approximately 20% of the footprint.  The remainder of the rotator cuff was in excellent condition.  The biceps tendon demonstrated an area of moderate "lip sticking" without partial full-thickness tearing.  The articular surfaces of the glenoid and humerus both were in satisfactory condition.  Complications:   None  Fluids:   700 cc  Estimated blood loss:   15 cc  Tourniquet time:   None  Drains:   None  Closure:   Staples      Brief clinical note:   The patient is a 57 year old female with a history of gradually worsening right shoulder pain. The patient's symptoms have progressed despite medications, activity modification, etc. The patient's history  and examination are consistent with impingement/tendinopathy with a rotator cuff tear. These findings were confirmed by MRI scan. The patient presents at this time for definitive management of these shoulder symptoms.  Procedure:   The patient underwent placement of an interscalene block using Exparel by the anesthesiologist in the preoperative holding area before being brought into the operating room and lain in the supine position. The patient then underwent general endotracheal intubation and anesthesia before being repositioned in the beach chair position using the beach chair positioner. The right shoulder and upper extremity were prepped with ChloraPrep solution before being draped sterilely. Preoperative antibiotics were administered. A timeout was performed to confirm the proper surgical site before the expected portal sites and incision site were injected with 0.5% Sensorcaine with epinephrine.   A posterior portal was created and the glenohumeral joint thoroughly inspected with the findings as described above. An anterior portal was created using an outside-in technique. The labrum and rotator cuff were further probed, again confirming the above-noted findings. The areas of degenerative fraying involving the superior and posterior superior portion of the labrum were debrided back to stable margins using the full-radius resector. The full-radius resector also was used to debride to the torn portions of the subscapularis and supraspinatus tendons, as well as to remove areas of reactive synovitis anteriorly, superiorly, and posteriorly. The ArthroCare wand was inserted and used to release the biceps tendon from its labral anchor. Given the size and activity level of the patient, it was felt that a formal biceps tenodesis was not warranted. The ArthroCare wand also was used to obtain hemostasis as well as to "anneal" the labrum superiorly.   The subscapularis tendon was repaired using a Mitek BioKnotless  anchor placed through the anterior portal a separate superolateral portal site  was created using an outside in technique to serve as a working portal. The exposed portion of the footprint on the lesser tuberosity was roughened with a full-radius resector in order to optimize healing before the repair was effected. The adequacy of the repair was assessed both by probing as well as with passive external rotation of the shoulder. The instruments were removed from the joint after suctioning the excess fluid.  The camera was repositioned through the posterior portal into the subacromial space. A separate lateral portal was created using an outside-in technique. The 3.5 mm full-radius resector was introduced and used to perform a subtotal bursectomy. The ArthroCare wand was then inserted and used to remove the periosteal tissue off the undersurface of the anterior third of the acromion as well as to recess the coracoacromial ligament from its attachment along the anterior and lateral margins of the acromion. The 4.0 mm acromionizing bur was introduced and used to complete the decompression by removing the undersurface of the anterior third of the acromion. The full radius resector was reintroduced to remove any residual bony debris before the ArthroCare wand was reintroduced to obtain hemostasis. The instruments were then removed from the subacromial space after suctioning the excess fluid.  An approximately 4-5 cm incision was made over the anterolateral aspect of the shoulder beginning at the anterolateral corner of the acromion and extending distally in line with the bicipital groove, incorporating the superolateral portal site. This incision was carried down through the subcutaneous tissues to expose the deltoid fascia. The raphae between the anterior and middle thirds was identified and this plane developed to provide access into the subacromial space. Additional bursal tissues were debrided sharply using  Metzenbaum scissors. The rotator cuff was carefully inspected and found to be intact on its bursal surface, other than a small area of superficial scuffing involving the anterior fibers of the supraspinatus tendon. Given that less than 25% of the insertional fibers were disrupted on the articular side, it was elected to proceed with the application of a [place] patch over the supraspinatus insertional fibers in order to stimulate healing of this tendon. Therefore, a medium sized patch was selected and secured using the appropriate bone and soft tissue staples. An apparent watertight closure was obtained.  The wound was copiously irrigated with sterile saline solution before the deltoid raphae was reapproximated using 2-0 Vicryl interrupted sutures. The subcutaneous tissues were closed in two layers using 2-0 Vicryl interrupted sutures before the skin was closed using staples. The portal sites also were closed using staples. A sterile bulky dressing was applied to the shoulder before the arm was placed into a shoulder immobilizer. The patient was then awakened, extubated, and returned to the recovery room in satisfactory condition after tolerating the procedure well.

## 2022-03-18 NOTE — H&P (Signed)
History of Present Illness: [name] White is a 57 y.o. female who presents today for her surgical history and physical for upcoming right shoulder arthroscopy with debridement, decompression, rotator cuff repair and probable biceps tenodesis. Surgery scheduled with Dr. Roland Rack on 03/18/2022. The patient reports a 5 out of 10 pain score in the right shoulder at today's visit. She denies any recent trauma or injury affecting the right upper extremity since her last evaluation. The patient is a diabetic, most recent A1c is 6.5. She is on metformin. She denies any personal history of DVT. She denies any personal history of heart attack or stroke. She denies any history of asthma or COPD. She does have a history of chronic pain syndrome and does take Dilaudid chronically written by a pain management specialist. She denies any changes in her medical history since she was last evaluated.  Past Medical History:  Anxiety  Arthritis  Barrett's esophagus  Chronic headache  Colon polyp  Depression  Gastroparesis  Hidradenitis suppurativa  IBS (irritable bowel syndrome)  Migraine  OSA (obstructive sleep apnea)  Ovarian cyst  Plantar fasciitis  Restless leg  Tobacco abuse   Past Surgical History: COLONOSCOPY 2012  laparoscopy lysis of adhesions 04/22/2010  SALPINGECTOMY 04/22/2010  laparoscopy with mini laparotomy and cystotomy repair 04/30/2010  ARTHROPLASTY HIP TOTAL Right 04/25/2017  IMPLANTS: DePuy Tri Lock stem, size 6, std offset. DePuy Pinnacle Cup, size 52 mm. DePuy Altrx liner, size 32 by 52 mm, neutral. DePuy Biolox ceramic head ball, size 32 + 1 mm.  ABDOMINAL HYSTERECTOMY  CESAREAN SECTION  CHOLECYSTECTOMY  elbow surgery  ESSURE TUBAL LIGATION  plantar fasciitis Right  wrist surgery   Past Family History: No Known Problems Mother  Breast cancer Cousin (paternal)   Medications: Current Outpatient Medications Ordered in Epic  Medication Sig Dispense Refill  acetaminophen (TYLENOL)  500 MG tablet Take 3,500 mg by mouth once daily  ARIPiprazole (ABILIFY) 5 MG tablet Take by mouth Take 1 tablet (5 mg total) by mouth daily.  dicyclomine (BENTYL) 10 mg capsule once daily  DULoxetine (CYMBALTA) 60 MG DR capsule Take 60 mg by mouth once daily  FUROsemide (LASIX) 20 MG tablet  HYDROmorphone (DILAUDID) 2 MG tablet  lamoTRIgine (LAMICTAL) 150 MG tablet Take 200 mg by mouth once daily  LINZESS 290 mcg capsule Take 1 capsule by mouth every morning before breakfast  metFORMIN (GLUCOPHAGE-XR) 500 MG XR tablet Take 500 mg by mouth daily with breakfast  mupirocin (BACTROBAN) 2 % ointment  nitroGLYcerin (NITROSTAT) 0.4 MG SL tablet Place 1 tablet under the tongue every 5 (five) minutes as needed  pantoprazole (PROTONIX) 40 MG DR tablet Take 1 tablet by mouth once daily  pravastatin (PRAVACHOL) 10 MG tablet  risperiDONE (RISPERDAL) 0.5 MG tablet Take 0.5 mg by mouth at bedtime  rizatriptan (MAXALT-MLT) 10 MG disintegrating tablet Take 10 mg by mouth once as needed  rOPINIRole (REQUIP) 1 MG tablet Take 2 tablets by mouth nightly  semaglutide (WEGOVY) 0.25 mg/0.5 mL pen injector Inject subcutaneously once a week  simvastatin (ZOCOR) 20 MG tablet Take 20 mg by mouth at bedtime  SUMAtriptan (IMITREX) 100 MG tablet TAKE 1 TABLET AT ONSET OF HEADACHE. MAY REPEAT IN 2 HOURS IF NEED. MAX 2 TABS IN 24 HOURS  topiramate (TOPAMAX) 200 MG tablet Take 1 tablet by mouth nightly  traZODone (DESYREL) 50 MG tablet TAKE 1 TABLET (50 MG TOTAL) BY MOUTH AT BEDTIME AS NEEDED FOR SLEEP.  carboxymethylcellulose (REFRESH TEARS) 0.5 % ophthalmic solution Apply 1 drop to  eye 2 (two) times daily (Patient not taking: Reported on 03/05/2022)  diazePAM (VALIUM) 5 MG tablet TAKE 1 TAB 90 MINUTES BEFORE PROCEDURE AND BRING BOTTLE WITH YOU (Patient not taking: Reported on 03/05/2022)  LORazepam (ATIVAN) 1 MG tablet TAKE 1 TAB 90 MINUTES BEFORE PROCEDURE AND BRING BOTTLE TO OFFICE (Patient not taking: Reported on 03/05/2022)   metoprolol tartrate (LOPRESSOR) 100 MG tablet metoprolol tartrate 100 mg tablet PLEASE SEE ATTACHED FOR DETAILED DIRECTIONS (Patient not taking: Reported on 03/05/2022)  moxifloxacin (VIGAMOX) 0.5 % ophthalmic solution INSTILL 1 DROP INTO AFFECTED EYE(S) BY OPHTHALMIC ROUTE 2 TIMES PER DAY FOR 7 DAYS (Patient not taking: Reported on 03/05/2022)  neomycin-polymyxin-dexAMETHasone (MAXITROL) ophthalmic ointment APPLY A SMALL AMOUNT ONTO SUTURES OR OPERATIVE SITE TWICE A DAY FOR THREE DAYS THEN STOP (Patient not taking: Reported on 03/05/2022)  nicotine (NICODERM CQ) 21 mg/24 hr patch Place onto the skin (Patient not taking: Reported on 03/05/2022)  OLANZapine (ZYPREXA) 10 MG tablet Take 1 tablet by mouth nightly (Patient not taking: Reported on 03/05/2022)  ondansetron (ZOFRAN) 4 MG tablet Take 1 tablet by mouth every 8 (eight) hours as needed (Patient not taking: Reported on 03/05/2022)  ondansetron (ZUPLENZ ORAL) ondansetron (Patient not taking: Reported on 03/05/2022)  peg-electrolyte (NULYTELY) solution TAKE 4,000 ML'S BY MOUTH ONCE FOR 1 DOSE (Patient not taking: Reported on 03/05/2022)  pregabalin (LYRICA) 50 MG capsule Take 50 mg by mouth once daily (Patient not taking: Reported on 03/05/2022)  traMADoL (ULTRAM) 50 mg tablet Take 1 tablet (50 mg total) by mouth every 8 (eight) hours as needed (Patient not taking: Reported on 03/05/2022) 30 tablet 0  trimethoprim-polymyxin b (POLYTRIM) ophthalmic solution INSTILL 1 DROP INTO AFFECTED EYE(S) EVERY 6 HOURS FOR 7 DAYS (Patient not taking: Reported on 03/05/2022)   Allergies: Morphine Hives and Nausea And Vomiting  INTOLERANCE > N & V Penicillins Hives, Itching and Other (See Comments)  PATIENT HAS HAD A PCN REACTION WITH IMMEDIATE RASH, FACIAL/TONGUE/THROAT SWELLING, SOB, OR LIGHTHEADEDNESS WITH HYPOTENSION: # # # YES # # #  Has patient had a PCN reaction causing severe rash involving mucus membranes or skin necrosis: Unknown Has patient had a PCN reaction  that required hospitalization: No Has patient had a PCN reaction occurring within the last 10 years: No If all of the above answers are "NO", then may proceed with Cephalosporin use. Clarithromycin Nausea And Vomiting and Unknown   Review of Systems:  A comprehensive 14 point ROS was performed, reviewed by me today, and the pertinent orthopaedic findings are documented in the HPI.  Physical Exam: BP 122/78  Ht 165.1 cm ('5\' 5"'$ )  Wt (!) 109.5 kg (241 lb 6.4 oz)  BMI 40.17 kg/m  General/Constitutional: The patient appears to be well-nourished, well-developed, and in no acute distress. Neuro/Psych: Normal mood and affect, oriented to person, place and time. Eyes: Non-icteric. Pupils are equal, round, and reactive to light, and exhibit synchronous movement. ENT: Unremarkable. Lymphatic: No palpable adenopathy. Respiratory: Lungs clear to auscultation, Normal chest excursion, No wheezes, and Non-labored breathing Cardiovascular: Regular rate and rhythm. No murmurs. and No edema, swelling or tenderness, except as noted in detailed exam. Integumentary: No impressive skin lesions present, except as noted in detailed exam. Musculoskeletal: Unremarkable, except as noted in detailed exam.  Right shoulder exam: SKIN: normal SWELLING: none WARMTH: none LYMPH NODES: no adenopathy palpable CREPITUS: none TENDERNESS: Mild to moderate tenderness over anterolateral shoulder ROM (active):  Forward flexion: 100 degrees Abduction: 115 degrees Internal rotation: Right buttock ROM (passive):  Forward  flexion: 135 degrees Abduction: 130 degrees  ER/IR at 90 abd: 85 degrees / 50 degrees  She experiences mild to moderate pain at the extremes of all motions.  STRENGTH: Forward flexion: 4/5 Abduction: 4/5 External rotation: 4-4+/5 Internal rotation: 4-4+/5 Pain with RC testing: Moderate pain with resisted forward flexion and abduction  STABILITY: Normal  SPECIAL TESTS: Luan Pulling' test: positive,  mild Speed's test: positive Capsulitis - pain w/ passive ER: no Crossed arm test: Minimally positive Crank: Not evaluated Anterior apprehension: Negative Posterior apprehension: Not evaluated  She is neurovascularly intact to the right upper extremity.  Imaging:  Shoulder X-Rays: Recent AP, Y-scapular, and axillary views of the right shoulder are available for review and have been reviewed by myself. These films demonstrate no evidence for fractures, lytic lesions, or significant degenerative changes. The subacromial space is mildly decreased. There is no subacromial or infra-clavicular spurring. She demonstrates a Type II acromion.  Right Shoulder Imaging, MRI: MRI Shoulder Cartilage: No cartilage abnormality. MRI Shoulder Rotator Cuff: Full thickness tear of the supraspinatus. Retracted to the humeral head. MRI Shoulder Labrum / Biceps: Biceps tendinopathy. MRI Shoulder Bone: Normal bone.  Impression: 1. Right rotator cuff tendonitis. 2. Biceps tendinitis of right upper extremity. 3. Nontraumatic complete tear of right rotator cuff. 4. Subacromial impingement of right shoulder.  Plan:  1. Treatment options were discussed today with the patient. 2. The patient is scheduled for a right shoulder arthroscopy with debridement, decompression, rotator cuff repair and probable biceps tenodesis with Dr. Roland Rack on 03/18/2022. 3. The patient was instructed on the risk and benefits of surgical intervention and wishes to proceed at this time. 4. This document will serve as a surgical history and physical for the patient. 5. The patient will follow-up per standard postop protocol. They can call the clinic they have any questions, new symptoms develop or symptoms worsen.  The procedure was discussed with the patient, as were the potential risks (including bleeding, infection, nerve and/or blood vessel injury, persistent or recurrent pain, failure of the repair, progression of arthritis, need for  further surgery, blood clots, strokes, heart attacks and/or arhythmias, pneumonia, etc.) and benefits. The patient states her understanding and wishes to proceed.    H&P reviewed and patient re-examined. No changes.

## 2022-03-18 NOTE — Discharge Instructions (Addendum)
POLAR CARE INFORMATION  [email or web address]  How to use [place] Cold Therapy System?  YouTube   [email or web address]  OPERATING INSTRUCTIONS  Start the product With dry hands, connect the transformer to the electrical connection located on the top of the cooler. Next, plug the transformer into an appropriate electrical outlet. The unit will automatically start running at this point.  To stop the pump, disconnect electrical power.  Unplug to stop the product when not in use. Unplugging the Polar Care unit turns it off. Always unplug immediately after use. Never leave it plugged in while unattended. Remove pad.    FIRST ADD WATER TO FILL LINE, THEN ICE---Replace ice when existing ice is almost melted  1 Discuss Treatment with your [facility] Practitioner and Use Only as Prescribed 2 Apply Insulation Barrier & Cold Therapy Pad 3 Check for Moisture 4 Inspect Skin Regularly  Tips and Trouble Shooting Usage Tips 1. Use cubed or chunked ice for optimal performance. 2. It is recommended to drain the Pad between uses. To drain the pad, hold the Pad upright with the hose pointed toward the ground. Depress the black plunger and allow water to drain out. 3. You may disconnect the Pad from the unit without removing the pad from the affected area by depressing the silver tabs on the hose coupling and gently pulling the hoses apart. The Pad and unit will seal itself and will not leak. Note: Some dripping during release is normal. 4. DO NOT RUN PUMP WITHOUT WATER! The pump in this unit is designed to run with water. Running the unit without water will cause permanent damage to the pump. 5. Unplug unit before removing lid.  TROUBLESHOOTING GUIDE Pump not running, Water not flowing to the pad, Pad is not getting cold 1. Make sure the transformer is plugged into the wall outlet. 2. Confirm that the ice and water are filled to the indicated levels. 3. Make sure  there are no kinks in the pad. 4. Gently pull on the blue tube to make sure the tube/pad junction is straight. 5. Remove the pad from the treatment site and ll it while the pad is lying at; then reapply. 6. Confirm that the pad couplings are securely attached to the unit. Listen for the double clicks (Figure 1) to confirm the pad couplings are securely attached.  Leaks    Note: Some condensation on the lines, controller, and pads is unavoidable, especially in warmer climates. 1. If using a [place] Polar Care Cold Therapy unit with a detachable Cold Therapy Pad, and a leak exists (other than condensation on the lines) disconnect the pad couplings. Make sure the silver tabs on the couplings are depressed before reconnecting the pad to the pump hose; then confirm both sides of the coupling are properly clicked in. 2. If the coupling continues to leak or a leak is detected in the pad itself, stop using it and call [facility] at [phone].  Cleaning After use, empty and dry the unit with a soft cloth. Warm water and mild detergent may be used occasionally to clean the pump and tubes.  WARNING: The [place] can be cold enough to cause serious injury, including full skin necrosis. Follow these Operating Instructions, and carefully read the Product Insert (see pouch on side of unit) and the Cold Therapy Pad Fitting Instructions (provided with each Cold Therapy Pad) prior to use.       Interscalene Nerve Block (ISNB) Discharge Instructions  For your surgery you have received an Interscalene Nerve Block. Nerve Blocks affect many types of nerves, including nerves that control movement, pain and normal sensation.  You may experience feelings such as numbness, tingling, heaviness, weakness or the inability to move your arm or the feeling or sensation that your arm has "fallen asleep". A nerve block can last for 2 - 36 hours or more depending on the medication used.  Usually the weakness  wears off first.  The tingling and heaviness usually wear off next.  Finally you may start to notice pain.  Keep in mind that this may occur in any order.  once a nerve block starts to wear off it is usually completely gone within 60 minutes. ISNB may cause mild shortness of breath, a hoarse voice, blurry vision, unequal pupils, or drooping of the face on the same side as the nerve block.  These symptoms will usually go away within 12 hours.  Very rarely the procedure itself can cause mild seizures. If needed, your surgeon will give you a prescription for pain medication.  It will take about 60 minutes for the oral pain medication to become fully effective.  So, it is recommended that you start taking this medication before the nerve block first begins to wear off, or when you first begin to feel discomfort. Keep in mind that nerve blocks often wear off in the middle of the night.   If you are going to bed and the block has not started to wear off or you have not started to have any discomfort, consider setting an alarm for 2 to 3 hours, so you can assess your block.  If you notice the block is wearing off or you are starting to have discomfort, you can take your pain medication. Take your pain medication only as prescribed.  Pain medication can cause sedation and decrease your breathing if you take more than you need for the level of pain that you have. Nausea is a common side effect of many pain medications.  You may want to eat something before taking your pain medicine to prevent nausea. After an Interscalene nerve block, you cannot feel pain, pressure or extremes in temperature in the effected arm.  Because your arm is numb it is at an increased risk for injury.  To decrease the possibility of injury, please practice the following:  While you are awake change the position of your arm frequently to prevent too much pressure on any one area for prolonged periods of time.  If you have a cast or tight  dressing, check the color or your fingers every couple of hours.  Call your surgeon with the appearance of any discoloration (white or blue). If you are given a sling to wear before you go home, please wear it  at all times until the block has completely worn off.  Do not get up at night without your sling. If you experience any problems or concerns, please contact your surgeon's office. If you experience severe or prolonged shortness of breath go to the nearest emergency department.SHOULDER SLING IMMOBILIZER   VIDEO Slingshot 2 Shoulder Brace Application - YouTube ---[email or web address]  INSTRUCTIONS While supporting the injured arm, slide the forearm into the sling. Wrap the adjustable shoulder strap around the neck and shoulders and attach the strap end to the sling using  the "alligator strap tab."  Adjust the shoulder strap to the required length. Position the shoulder pad behind the neck. To secure the shoulder pad location (  optional), pull the shoulder strap away from the shoulder pad, unfold the hook material on the top of the pad, then press the shoulder strap back onto the hook material to secure the pad in place. Attach the closure strap across the open top of the sling. Position the strap so that it holds the arm securely in the sling. Next, attach the thumb strap to the open end of the sling between the thumb and fingers. After sling has been fit, it may be easily removed and reapplied using the quick release buckle on shoulder strap. If a neutral pillow or 15 abduction pillow is included, place the pillow at the waistline. Attach the sling to the pillow, lining up hook material on the pillow with the loop on sling. Adjust the waist strap to fit.  If waist strap is too long, cut it to fit. Use the small piece of double sided hook material (located on top of the pillow) to secure the strap end. Place the double sided hook material on the inside of the cut strap  end and secure it to the waist strap.     If no pillow is included, attach the waist strap to the sling and adjust to fit.    Washing Instructions: Straps and sling must be removed and cleaned regularly depending on your activity level and perspiration. Hand wash straps and sling in cold water with mild detergent, rinse, air dry   AMBULATORY SURGERY  DISCHARGE INSTRUCTIONS   The drugs that you were given will stay in your system until tomorrow so for the next 24 hours you should not:  Drive an automobile Make any legal decisions Drink any alcoholic beverage   You may resume regular meals tomorrow.  Today it is better to start with liquids and gradually work up to solid foods.  You may eat anything you prefer, but it is better to start with liquids, then soup and crackers, and gradually work up to solid foods.   Please notify your doctor immediately if you have any unusual bleeding, trouble breathing, redness and pain at the surgery site, drainage, fever, or pain not relieved by medication.    Your post-operative visit with Dr.                                       is: Date:                        Time:    Please call to schedule your post-operative visit.  Additional Instructions:   Orthopedic discharge instructions: Keep dressing dry and intact.  May shower after dressing changed and nerve block has worn off (on Monday  - post-op day #4).  Cover staples with Band-Aids after drying off. Apply ice frequently to shoulder. Take ibuprofen 600-800 mg TID with meals for 3-5 days, then as necessary. Take oxycodone as prescribed when needed.  May supplement with ES Tylenol if necessary. Keep shoulder immobilizer on at all times except may remove for bathing purposes. Follow-up in 10-14 days or as scheduled.

## 2022-03-18 NOTE — Anesthesia Postprocedure Evaluation (Signed)
Anesthesia Post Note  Patient: Mazzie Brodrick Renstrom  Procedure(s) Performed: SHOULDER ARTHROSCOPY WITH DEBRIDEMENT, DECOMPRESSION, ROTATOR CUFF REPAIR AND BICEPS TENOLYSIS (Right: Shoulder) OPEN ROTATOR CUFF REPAIR (Right: Shoulder)  Patient location during evaluation: PACU Anesthesia Type: General Level of consciousness: awake and alert Pain management: pain level controlled Vital Signs Assessment: post-procedure vital signs reviewed and stable Respiratory status: spontaneous breathing, nonlabored ventilation, respiratory function stable and patient connected to nasal cannula oxygen Cardiovascular status: blood pressure returned to baseline and stable Postop Assessment: no apparent nausea or vomiting Anesthetic complications: no   No notable events documented.   Last Vitals:  Vitals:   03/18/22 1252 03/18/22 1320  BP: 119/80   Pulse: 93   Resp: 16   Temp: 36.7 C 36.4 C  SpO2:      Last Pain:  Vitals:   03/18/22 1252  TempSrc: Temporal  PainSc: 0-No pain                 Alphonsus Sias

## 2022-03-23 DIAGNOSIS — M25511 Pain in right shoulder: Secondary | ICD-10-CM | POA: Diagnosis not present

## 2022-03-23 DIAGNOSIS — Z9889 Other specified postprocedural states: Secondary | ICD-10-CM | POA: Diagnosis not present

## 2022-03-25 ENCOUNTER — Other Ambulatory Visit (HOSPITAL_COMMUNITY): Payer: Self-pay

## 2022-03-25 ENCOUNTER — Other Ambulatory Visit: Payer: Self-pay

## 2022-03-25 DIAGNOSIS — M5416 Radiculopathy, lumbar region: Secondary | ICD-10-CM | POA: Diagnosis not present

## 2022-03-25 DIAGNOSIS — G894 Chronic pain syndrome: Secondary | ICD-10-CM | POA: Diagnosis not present

## 2022-03-25 DIAGNOSIS — M7061 Trochanteric bursitis, right hip: Secondary | ICD-10-CM | POA: Diagnosis not present

## 2022-03-25 DIAGNOSIS — M47816 Spondylosis without myelopathy or radiculopathy, lumbar region: Secondary | ICD-10-CM | POA: Diagnosis not present

## 2022-03-25 MED ORDER — HYDROMORPHONE HCL 2 MG PO TABS
2.0000 mg | ORAL_TABLET | Freq: Every day | ORAL | 0 refills | Status: DC | PRN
  Filled 2022-03-25 – 2022-04-09 (×2): qty 150, 32d supply, fill #0
  Filled 2022-04-12: qty 150, 30d supply, fill #0

## 2022-03-25 MED ORDER — OXYCODONE HCL 5 MG PO TABS
5.0000 mg | ORAL_TABLET | ORAL | 0 refills | Status: DC | PRN
  Filled 2022-03-25: qty 40, 4d supply, fill #0

## 2022-03-29 ENCOUNTER — Encounter: Payer: Self-pay | Admitting: Physician Assistant

## 2022-03-29 ENCOUNTER — Other Ambulatory Visit (HOSPITAL_COMMUNITY): Payer: Self-pay

## 2022-03-29 MED ORDER — WEGOVY 0.25 MG/0.5ML ~~LOC~~ SOAJ
0.2500 mg | SUBCUTANEOUS | 0 refills | Status: DC
  Filled 2022-03-29 – 2022-05-26 (×2): qty 2, 28d supply, fill #0

## 2022-03-29 NOTE — Telephone Encounter (Signed)
Rx for [facility] sent to [facility].

## 2022-03-30 ENCOUNTER — Other Ambulatory Visit (HOSPITAL_COMMUNITY): Payer: Self-pay

## 2022-03-30 DIAGNOSIS — Z9889 Other specified postprocedural states: Secondary | ICD-10-CM | POA: Diagnosis not present

## 2022-03-30 DIAGNOSIS — M25511 Pain in right shoulder: Secondary | ICD-10-CM | POA: Diagnosis not present

## 2022-04-01 ENCOUNTER — Telehealth: Payer: Self-pay | Admitting: *Deleted

## 2022-04-01 NOTE — Telephone Encounter (Signed)
Received response thru Covermymeds: We have denied your request as the diagnosis submitted is considered to be part of an excluded (not covered) category, medications utilized to promote weight loss, by the [employer] for [employer] and Medicaid Services.

## 2022-04-01 NOTE — Telephone Encounter (Signed)
PA needed for Wegovy 0.25 mg. PA done thru Covermymeds. Awaiting determination. KEY: [id number]

## 2022-04-02 ENCOUNTER — Ambulatory Visit: Payer: Medicare Other

## 2022-04-02 NOTE — Telephone Encounter (Signed)
Spoke to pt told her PA for Mancel Parsons has been denied by insurance due to excluded coverage under her plan. Pt verbalized understanding.

## 2022-04-05 ENCOUNTER — Other Ambulatory Visit (HOSPITAL_COMMUNITY): Payer: Self-pay

## 2022-04-05 MED ORDER — OXYCODONE HCL 5 MG PO TABS
5.0000 mg | ORAL_TABLET | Freq: Four times a day (QID) | ORAL | 0 refills | Status: DC | PRN
  Filled 2022-04-05: qty 30, 4d supply, fill #0

## 2022-04-07 DIAGNOSIS — M25511 Pain in right shoulder: Secondary | ICD-10-CM | POA: Diagnosis not present

## 2022-04-07 DIAGNOSIS — Z9889 Other specified postprocedural states: Secondary | ICD-10-CM | POA: Diagnosis not present

## 2022-04-09 ENCOUNTER — Other Ambulatory Visit (HOSPITAL_COMMUNITY): Payer: Self-pay

## 2022-04-09 ENCOUNTER — Encounter: Payer: Self-pay | Admitting: Cardiology

## 2022-04-09 ENCOUNTER — Ambulatory Visit: Payer: PPO | Attending: Cardiology | Admitting: Cardiology

## 2022-04-09 VITALS — BP 136/80 | HR 94 | Ht 66.0 in | Wt 236.8 lb

## 2022-04-09 DIAGNOSIS — R079 Chest pain, unspecified: Secondary | ICD-10-CM | POA: Diagnosis not present

## 2022-04-09 DIAGNOSIS — R600 Localized edema: Secondary | ICD-10-CM | POA: Diagnosis not present

## 2022-04-09 DIAGNOSIS — I2583 Coronary atherosclerosis due to lipid rich plaque: Secondary | ICD-10-CM

## 2022-04-09 DIAGNOSIS — I2510 Atherosclerotic heart disease of native coronary artery without angina pectoris: Secondary | ICD-10-CM

## 2022-04-09 DIAGNOSIS — R0609 Other forms of dyspnea: Secondary | ICD-10-CM

## 2022-04-09 DIAGNOSIS — E7800 Pure hypercholesterolemia, unspecified: Secondary | ICD-10-CM | POA: Diagnosis not present

## 2022-04-09 NOTE — Patient Instructions (Signed)
Medication Instructions:  Your physician recommends that you continue on your current medications as directed. Please refer to the Current Medication list given to you today.  *If you need a refill on your cardiac medications before your next appointment, please call your pharmacy*   Lab Work: None.  If you have labs (blood work) drawn today and your tests are completely normal, you will receive your results only by: [place] (if you have MyChart) OR A paper copy in the mail If you have any lab test that is abnormal or we need to change your treatment, we will call you to review the results.   Testing/Procedures: None.   Follow-Up: At [facility], you and your health needs are our priority.  As part of our continuing mission to provide you with exceptional heart care, we have created designated Provider Care Teams.  These Care Teams include your primary Cardiologist (physician) and Advanced Practice Providers (APPs -  Physician Assistants and Nurse Practitioners) who all work together to provide you with the care you need, when you need it.  We recommend signing up for the patient portal called "MyChart".  Sign up information is provided on this After Visit Summary.  MyChart is used to connect with patients for Virtual Visits (Telemedicine).  Patients are able to view lab/test results, encounter notes, upcoming appointments, etc.  Non-urgent messages can be sent to your provider as well.   To learn more about what you can do with MyChart, go to [email or web address].    Your next appointment:   1 year(s)  Provider:   Fransico Him, MD

## 2022-04-09 NOTE — Progress Notes (Signed)
Cardiology  Note    Date:  04/09/2022   ID:  [name] White, DOB 1965-10-08, MRN [id number]  PCP:  Inda Coke, PA  Cardiologist:  Fransico Him, MD   Chief Complaint  Patient presents with   Follow-up    Leg edema, CAD, dyspnea on exertion, hyperlipidemia     History of Present Illness:  [name] White is a 57 y.o. female  with a hx of obesity, anxiety with panic attacks, Barrett's esophagus, Bipolar disorder, depression, GERD and OSA (not using CPAP) who was initially referred for evaluation of LE edema. In the past she has admitted to using salt when cooking but not at the table.  She also would eat sausage but no salty snacks. She has OSA but did not tolerate the PAP device with severe dry mouth with both the nasal and FFM masks. At her initial consultation she complained of episodic CP.  Coronary CT was done 02/10/2021 showing a coronary calcium score of 1 with minimal less than 25% noncalcified plaque in the proximal LAD otherwise normal coronary arteries.  2D echo 01/2021 showed EF 55 to 60% with mild LVH.    She was recently seen back by Nicholes Rough, PA complaining of shortness of breath and had a recent ED visit in January with chest pain shortness of breath and palpitations.  EKG and troponin were normal.  She admitted to getting chest pain some with her panic attacksm anxiety.  It was felt that likely her chest pain was both GERD and panic attacks.  Because of her worsening shortness of breath, repeat 2D echo was done which showed an EF of 60 to 65% with normal diastolic function, trivial MR and AR essentially normal.  She is here today for followup and is doing well.  She still continues to complain of chronic DOE but is stable.  She had a lung CA screening CT done last fall that showed some emphysema.  She has a hx of tobacco use.  She denies any chest pain or pressure, PND, orthopnea, LE edema, palpitations or syncope. She is compliant with her meds and is tolerating meds  with no SE.    Past Medical History:  Diagnosis Date   Anterolisthesis    Arthritis    Barrett's esophagus    egd 2007 + 2009 but none 2011 or 08/2010 egd   Bipolar 1 disorder (HCC)    CAD (coronary artery disease) 02/03/2021   a.) cCTA 02/03/2021: Ca score = 1 (72nd percentile for age/sex match control)   Chronic, continuous use of opioids    Colon polyp, hyperplastic    Depression    Diastolic dysfunction [id number]   a.) TTE 02/06/2021: EF 55-60%, mild LVH, G1DD   DOE (dyspnea on exertion)    Family history of adverse reaction to anesthesia    a.) delayed emergence in 1st degree relative (brother)   Gastroparesis    Generalized anxiety disorder with panic attacks    GERD (gastroesophageal reflux disease)    Grief reaction    Hidradenitis suppurativa    History of kidney stones    HLD (hyperlipidemia)    IBS (irritable bowel syndrome)    Migraine    Morbid obesity (HCC)    OSA (obstructive sleep apnea)    a.) did not tolerate nocturnal PAP therapy mainly due to anxiety and associated xerostomia   Ovarian cyst    Palpitations    Plantar fasciitis    PTSD (post-traumatic stress disorder)  Restless leg    a.) on ropinirole   Suicide attempt ([place]) 2003   Tobacco abuse    Type 2 diabetes mellitus treated without insulin ([place])     Past Surgical History:  Procedure Laterality Date   ABDOMINAL HYSTERECTOMY     secondary to endometriosis   BACK SURGERY Right 2019   lumbar ablation L3-S1 x 2    BIOPSY  05/07/2021   Procedure: BIOPSY;  Surgeon: Irving Copas., MD;  Location: [facility];  Service: Gastroenterology;;   CESAREAN SECTION     x 2   CHOLECYSTECTOMY     COLONOSCOPY W/ POLYPECTOMY  2012   COLONOSCOPY WITH PROPOFOL N/A 05/01/2020   Procedure: COLONOSCOPY WITH PROPOFOL;  Surgeon: Irving Copas., MD;  Location: [facility];  Service: Gastroenterology;  Laterality: N/A;   COLONOSCOPY WITH PROPOFOL N/A 05/07/2021   Procedure: COLONOSCOPY WITH  PROPOFOL;  Surgeon: Rush Landmark Telford Nab., MD;  Location: [facility];  Service: Gastroenterology;  Laterality: N/A;   ELBOW SURGERY Left    ENDOSCOPIC MUCOSAL RESECTION N/A 05/01/2020   Procedure: ENDOSCOPIC MUCOSAL RESECTION;  Surgeon: Rush Landmark Telford Nab., MD;  Location: [facility];  Service: Gastroenterology;  Laterality: N/A;   ENDOSCOPIC MUCOSAL RESECTION N/A 05/07/2021   Procedure: ENDOSCOPIC MUCOSAL RESECTION;  Surgeon: Rush Landmark Telford Nab., MD;  Location: [facility];  Service: Gastroenterology;  Laterality: N/A;   GANGLION CYST EXCISION Right    HEMOSTASIS CLIP PLACEMENT  05/01/2020   Procedure: HEMOSTASIS CLIP PLACEMENT;  Surgeon: Irving Copas., MD;  Location: [place] [facility];  Service: Gastroenterology;;   HEMOSTASIS CLIP PLACEMENT  05/07/2021   Procedure: HEMOSTASIS CLIP PLACEMENT;  Surgeon: Irving Copas., MD;  Location: [facility];  Service: Gastroenterology;;   HOT HEMOSTASIS N/A 05/07/2021   Procedure: HOT HEMOSTASIS (ARGON PLASMA COAGULATION/BICAP);  Surgeon: Irving Copas., MD;  Location: [facility];  Service: Gastroenterology;  Laterality: N/A;   LAPAROSCOPIC LYSIS OF ADHESIONS  04/22/2010   LAPAROSCOPY  04/30/2010   with mini laparotomy and cystotomy repair   PLANTAR FASCIA SURGERY Right    POLYPECTOMY  05/01/2020   Procedure: POLYPECTOMY;  Surgeon: Mansouraty, Telford Nab., MD;  Location: [facility];  Service: Gastroenterology;;   POLYPECTOMY  05/07/2021   Procedure: POLYPECTOMY;  Surgeon: Irving Copas., MD;  Location: [facility];  Service: Gastroenterology;;   SALPINGECTOMY Right 04/22/2010   SHOULDER ARTHROSCOPY WITH SUBACROMIAL DECOMPRESSION, ROTATOR CUFF REPAIR AND BICEP TENDON REPAIR Right 03/18/2022   Procedure: SHOULDER ARTHROSCOPY WITH DEBRIDEMENT, DECOMPRESSION, ROTATOR CUFF REPAIR AND BICEPS TENOLYSIS;  Surgeon: Corky Mull, MD;  Location: [facility];  Service: Orthopedics;  Laterality: Right;   SHOULDER OPEN  ROTATOR CUFF REPAIR Right 03/18/2022   Procedure: OPEN ROTATOR CUFF REPAIR;  Surgeon: Corky Mull, MD;  Location: [facility];  Service: Orthopedics;  Laterality: Right;   SUBMUCOSAL LIFTING INJECTION  05/01/2020   Procedure: SUBMUCOSAL LIFTING INJECTION;  Surgeon: Rush Landmark Telford Nab., MD;  Location: [facility];  Service: Gastroenterology;;   SUBMUCOSAL LIFTING INJECTION  05/07/2021   Procedure: SUBMUCOSAL LIFTING INJECTION;  Surgeon: Irving Copas., MD;  Location: [facility];  Service: Gastroenterology;;   TOTAL HIP ARTHROPLASTY Right 04/25/2017   Procedure: RIGHT TOTAL HIP ARTHROPLASTY ANTERIOR APPROACH;  Surgeon: Rod Can, MD;  Location: [facility];  Service: Orthopedics;  Laterality: Right;  Needs RNFA   TUBAL LIGATION      Current Medications: Current Meds  Medication Sig   acetaminophen (TYLENOL) 500 MG tablet Take 2,000 mg by mouth daily as needed for moderate pain.   dicyclomine (  BENTYL) 10 MG capsule Take 10 mg by mouth as needed for spasms.   DULoxetine (CYMBALTA) 60 MG capsule Take 1 capsule (60 mg total) by mouth at bedtime.   furosemide (LASIX) 20 MG tablet Take 1 tablet (20 mg total) by mouth as needed.   HYDROmorphone (DILAUDID) 2 MG tablet Take 1 tablet (2 mg total) by mouth 5 (five) times daily as needed for pain.   lamoTRIgine (LAMICTAL) 200 MG tablet Take 1 tablet (200 mg total) by mouth at bedtime.   linaclotide (LINZESS) 145 MCG CAPS capsule Take 1 capsule (145 mcg total) by mouth daily before breakfast.   metFORMIN (GLUCOPHAGE-XR) 500 MG 24 hr tablet Take 1 tablet (500 mg total) by mouth daily with breakfast.   nitroGLYCERIN (NITROSTAT) 0.4 MG SL tablet Place 1 tablet (0.4 mg total) under the tongue every 5 (five) minutes as needed for chest pain.   oxyCODONE (OXY IR/ROXICODONE) 5 MG immediate release tablet Take 1-2 tablets (5-10 mg total) by mouth every 6 (six) hours as needed for pain   oxyCODONE (ROXICODONE) 5 MG immediate release tablet Take 1-2  tablets (5-10 mg total) by mouth every 4 (four) hours as needed for moderate pain or severe pain.   pantoprazole (PROTONIX) 40 MG tablet Take 1 tablet (40 mg total) by mouth at bedtime.   pravastatin (PRAVACHOL) 40 MG tablet Take 1 tablet (40 mg total) by mouth at bedtime.   risperiDONE (RISPERDAL) 1 MG tablet Take 1 tablet (1 mg total) by mouth at bedtime.   rizatriptan (MAXALT-MLT) 10 MG disintegrating tablet Take 1 tablet (10 mg total) by mouth as needed for migraine. May repeat in 2 hours if needed.  Maximum 2 tablets in 24 hours   rOPINIRole (REQUIP) 1 MG tablet TAKE 2 TABLETS (2 MG TOTAL) BY MOUTH AT BEDTIME.   Semaglutide-Weight Management (WEGOVY) 0.25 MG/0.5ML SOAJ Inject 0.25 mg into the skin once a week.   topiramate (TOPAMAX) 200 MG tablet Take 1 tablet (200 mg total) by mouth at bedtime.   traZODone (DESYREL) 100 MG tablet Take 2 tablets (200 mg total) by mouth at bedtime.    Allergies:   Morphine, Penicillins, and Clarithromycin   Social History   Socioeconomic History   Marital status: Divorced    Spouse name: Not on file   Number of children: 2   Years of education: Not on file   Highest education level: Not on file  Occupational History   Occupation: Bartender  Tobacco Use   Smoking status: Every Day    Packs/day: 1.00    Years: 40.00    Total pack years: 40.00    Types: Cigarettes    Start date: 10/16/1980    Last attempt to quit: 2023    Years since quitting: 1.1   Smokeless tobacco: Never   Tobacco comments:    Has smoked off and on since the age of 17, quit while pregnant twice and also quit in 2017 but restarted in 2022  Vaping Use   Vaping Use: Never used  Substance and Sexual Activity   Alcohol use: Not Currently    Alcohol/week: 6.0 standard drinks of alcohol    Types: 6 Standard drinks or equivalent per week    Comment: wine/liquor 2 x a month   Drug use: No   Sexual activity: Yes    Birth control/protection: Surgical    Comment: Hysterectomy   Other Topics Concern   Not on file  Social History Narrative   Occupation: bartender   Divorced  --  currently single   Alcohol use-yes -x 5 per weekend   No longer a smoker   Social Determinants of Health   Financial Resource Strain: Medium Risk (10/05/2021)   Overall Financial Resource Strain (CARDIA)    Difficulty of Paying Living Expenses: Somewhat hard  Food Insecurity: Food Insecurity Present (10/05/2021)   Hunger Vital Sign    Worried About Running Out of Food in the Last Year: Sometimes true    Ran Out of Food in the Last Year: Sometimes true  Transportation Needs: No Transportation Needs (10/05/2021)   PRAPARE - Hydrologist (Medical): No    Lack of Transportation (Non-Medical): No  Physical Activity: Sufficiently Active (10/05/2021)   Exercise Vital Sign    Days of Exercise per Week: 3 days    Minutes of Exercise per Session: 60 min  Stress: Stress Concern Present (10/05/2021)   [place]    Feeling of Stress : Rather much  Social Connections: Socially Isolated (10/05/2021)   Social Connection and Isolation Panel [NHANES]    Frequency of Communication with Friends and Family: More than three times a week    Frequency of Social Gatherings with Friends and Family: Once a week    Attends Religious Services: Never    Marine scientist or Organizations: No    Attends Music therapist: Never    Marital Status: Divorced     Family History:  The patient's family history includes Aneurysm in her maternal grandmother; Breast cancer in her cousin, maternal aunt, and mother; COPD in her mother; Colon cancer in her maternal grandfather; Colon polyps in her brother, maternal grandfather, and mother; Coronary artery disease in her mother; Hypertension in her father; Kidney cancer in her paternal aunt; Kidney disease in her brother; Migraines in her mother; Other in her brother  and mother; Rheum arthritis in her mother; Stroke in her mother; Uterine cancer in her cousin.   ROS:   Please see the history of present illness.    ROS All other systems reviewed and are negative.      No data to display             PHYSICAL EXAM:   VS:  BP 136/80   Pulse 94   Ht 5' 6"$  (1.676 m)   Wt 236 lb 12.8 oz (107.4 kg)   LMP  (LMP Unknown)   SpO2 95%   BMI 38.22 kg/m    GEN: Well nourished, well developed in no acute distress HEENT: Normal NECK: No JVD; No carotid bruits LYMPHATICS: No lymphadenopathy CARDIAC:RRR, no murmurs, rubs, gallops RESPIRATORY:  Clear to auscultation without rales, wheezing or rhonchi  ABDOMEN: Soft, non-tender, non-distended MUSCULOSKELETAL:  No edema; No deformity  SKIN: Warm and dry NEUROLOGIC:  Alert and oriented x 3 PSYCHIATRIC:  Normal affect  Wt Readings from Last 3 Encounters:  04/09/22 236 lb 12.8 oz (107.4 kg)  03/18/22 236 lb 9.6 oz (107.3 kg)  03/09/22 236 lb 9.6 oz (107.3 kg)      Studies/Labs Reviewed:   EKG:  EKG is not ordered today.    Recent Labs: 02/01/2022: ALT 17 03/02/2022: Platelets 191 03/18/2022: BUN 10; Creatinine, Ser 0.90; Hemoglobin 13.3; Potassium 3.7; Sodium 138   Lipid Panel    Component Value Date/Time   CHOL 174 02/01/2022 0913   CHOL 246 (H) 08/20/2014 0809   TRIG 108.0 02/01/2022 0913   TRIG 156 (H) 08/20/2014 0809   HDL  49.00 02/01/2022 0913   HDL 63 08/20/2014 0809   CHOLHDL 4 02/01/2022 0913   VLDL 21.6 02/01/2022 0913   LDLCALC 103 (H) 02/01/2022 0913   LDLCALC 146 (H) 11/28/2019 1131   LDLCALC 152 (H) 08/20/2014 0809   LDLDIRECT 160.5 08/30/2012 1434   Additional studies/ records that were reviewed today include:  OV notes from PCP  ASSESSMENT:    1. Leg edema   2. Chest pain of uncertain etiology   3. Coronary artery disease due to lipid rich plaque   4. DOE (dyspnea on exertion)   5. Pure hypercholesterolemia      PLAN:  In order of problems listed above:  LE  edema -likely multifactorial related to dietary indiscretion with Na and obesity -Recent 2D echo was normal with normal EF and normal diastolic function -recommend DASH diet with < 2gm Na daily and weight loss  2.  ASCAD/Chest pain -this is atypical in that it is nonexertional with no radiation >>this has resolved -Recent coronary CTA showed minimal less than 25% plaque in the LAD and not the cause of her chest pain -Continue aspirin 81 mg daily and statin therapy  3.  DOE -likely related to obesity -Recent 2D was normal  4.  Hyperlipidemia -LDL goal less than 70 -Continue prescription drug management pravastatin 40 mg daily with as needed refills -her pravastatin was increased after her labs in 01/2022 -repeat FLP and ALT  Time Spent: 20 minutes total time of encounter, including 15 minutes spent in face-to-face patient care on the date of this encounter. This time includes coordination of care and counseling regarding above mentioned problem list. Remainder of non-face-to-face time involved reviewing chart documents/testing relevant to the patient encounter and documentation in the medical record. I have independently reviewed documentation from referring provider  Medication Adjustments/Labs and Tests Ordered: Current medicines are reviewed at length with the patient today.  Concerns regarding medicines are outlined above.  Medication changes, Labs and Tests ordered today are listed in the Patient Instructions below.  There are no Patient Instructions on file for this visit.   Signed, Fransico Him, MD  04/09/2022 3:59 PM    [facility] [place], [place], [place]  [address] Phone: [phone]; Fax: [phone]

## 2022-04-12 ENCOUNTER — Other Ambulatory Visit (HOSPITAL_COMMUNITY): Payer: Self-pay

## 2022-04-12 ENCOUNTER — Encounter (HOSPITAL_COMMUNITY): Payer: Self-pay

## 2022-04-12 ENCOUNTER — Other Ambulatory Visit: Payer: Self-pay

## 2022-04-13 ENCOUNTER — Other Ambulatory Visit: Payer: Self-pay

## 2022-04-13 ENCOUNTER — Other Ambulatory Visit (HOSPITAL_COMMUNITY): Payer: Self-pay

## 2022-04-13 MED ORDER — HYDROCODONE-ACETAMINOPHEN 5-325 MG PO TABS
1.0000 | ORAL_TABLET | Freq: Every day | ORAL | 0 refills | Status: DC | PRN
  Filled 2022-04-13: qty 150, 30d supply, fill #0

## 2022-04-14 ENCOUNTER — Other Ambulatory Visit: Payer: Self-pay | Admitting: Physician Assistant

## 2022-04-14 DIAGNOSIS — Z9889 Other specified postprocedural states: Secondary | ICD-10-CM | POA: Diagnosis not present

## 2022-04-14 DIAGNOSIS — M25511 Pain in right shoulder: Secondary | ICD-10-CM | POA: Diagnosis not present

## 2022-04-20 DIAGNOSIS — Z9889 Other specified postprocedural states: Secondary | ICD-10-CM | POA: Diagnosis not present

## 2022-04-20 DIAGNOSIS — M25511 Pain in right shoulder: Secondary | ICD-10-CM | POA: Diagnosis not present

## 2022-04-22 ENCOUNTER — Other Ambulatory Visit (HOSPITAL_COMMUNITY): Payer: Self-pay

## 2022-04-22 DIAGNOSIS — M7061 Trochanteric bursitis, right hip: Secondary | ICD-10-CM | POA: Diagnosis not present

## 2022-04-22 DIAGNOSIS — M5416 Radiculopathy, lumbar region: Secondary | ICD-10-CM | POA: Diagnosis not present

## 2022-04-22 DIAGNOSIS — M47816 Spondylosis without myelopathy or radiculopathy, lumbar region: Secondary | ICD-10-CM | POA: Diagnosis not present

## 2022-04-22 DIAGNOSIS — G894 Chronic pain syndrome: Secondary | ICD-10-CM | POA: Diagnosis not present

## 2022-04-22 MED ORDER — HYDROCODONE-ACETAMINOPHEN 7.5-325 MG PO TABS
1.0000 | ORAL_TABLET | Freq: Every day | ORAL | 0 refills | Status: DC | PRN
  Filled 2022-04-22 – 2022-05-10 (×2): qty 150, 30d supply, fill #0

## 2022-04-26 DIAGNOSIS — M25511 Pain in right shoulder: Secondary | ICD-10-CM | POA: Diagnosis not present

## 2022-04-26 DIAGNOSIS — Z9889 Other specified postprocedural states: Secondary | ICD-10-CM | POA: Diagnosis not present

## 2022-05-03 ENCOUNTER — Other Ambulatory Visit: Payer: Self-pay | Admitting: Physician Assistant

## 2022-05-03 NOTE — Telephone Encounter (Signed)
Pt requesting refill for Requip. Last OV 02/01/2022. Scheduled for 07/2022.

## 2022-05-10 ENCOUNTER — Other Ambulatory Visit (HOSPITAL_COMMUNITY): Payer: Self-pay

## 2022-05-10 DIAGNOSIS — Z9889 Other specified postprocedural states: Secondary | ICD-10-CM | POA: Diagnosis not present

## 2022-05-10 DIAGNOSIS — M25511 Pain in right shoulder: Secondary | ICD-10-CM | POA: Diagnosis not present

## 2022-05-13 DIAGNOSIS — M25511 Pain in right shoulder: Secondary | ICD-10-CM | POA: Diagnosis not present

## 2022-05-13 DIAGNOSIS — Z9889 Other specified postprocedural states: Secondary | ICD-10-CM | POA: Diagnosis not present

## 2022-05-14 ENCOUNTER — Other Ambulatory Visit: Payer: Self-pay | Admitting: Physician Assistant

## 2022-05-26 ENCOUNTER — Other Ambulatory Visit (HOSPITAL_COMMUNITY): Payer: Self-pay

## 2022-05-27 ENCOUNTER — Other Ambulatory Visit (HOSPITAL_COMMUNITY): Payer: Self-pay

## 2022-06-03 ENCOUNTER — Other Ambulatory Visit (HOSPITAL_COMMUNITY): Payer: Self-pay

## 2022-06-03 DIAGNOSIS — G894 Chronic pain syndrome: Secondary | ICD-10-CM | POA: Diagnosis not present

## 2022-06-03 DIAGNOSIS — M47816 Spondylosis without myelopathy or radiculopathy, lumbar region: Secondary | ICD-10-CM | POA: Diagnosis not present

## 2022-06-03 DIAGNOSIS — M7061 Trochanteric bursitis, right hip: Secondary | ICD-10-CM | POA: Diagnosis not present

## 2022-06-03 DIAGNOSIS — M5416 Radiculopathy, lumbar region: Secondary | ICD-10-CM | POA: Diagnosis not present

## 2022-06-03 MED ORDER — HYDROCODONE-ACETAMINOPHEN 7.5-325 MG PO TABS
1.0000 | ORAL_TABLET | Freq: Every day | ORAL | 0 refills | Status: DC | PRN
  Filled 2022-06-03 – 2022-06-07 (×2): qty 150, 30d supply, fill #0

## 2022-06-04 ENCOUNTER — Other Ambulatory Visit (HOSPITAL_COMMUNITY): Payer: Self-pay

## 2022-06-04 ENCOUNTER — Other Ambulatory Visit: Payer: Self-pay

## 2022-06-07 ENCOUNTER — Other Ambulatory Visit: Payer: Self-pay

## 2022-06-07 ENCOUNTER — Other Ambulatory Visit (HOSPITAL_COMMUNITY): Payer: Self-pay

## 2022-06-08 ENCOUNTER — Telehealth (HOSPITAL_BASED_OUTPATIENT_CLINIC_OR_DEPARTMENT_OTHER): Payer: PPO | Admitting: Psychiatry

## 2022-06-08 ENCOUNTER — Encounter (HOSPITAL_COMMUNITY): Payer: Self-pay | Admitting: Psychiatry

## 2022-06-08 DIAGNOSIS — F4310 Post-traumatic stress disorder, unspecified: Secondary | ICD-10-CM

## 2022-06-08 DIAGNOSIS — F319 Bipolar disorder, unspecified: Secondary | ICD-10-CM | POA: Diagnosis not present

## 2022-06-08 MED ORDER — TRAZODONE HCL 100 MG PO TABS
200.0000 mg | ORAL_TABLET | Freq: Every day | ORAL | 2 refills | Status: DC
Start: 2022-06-08 — End: 2022-09-08

## 2022-06-08 MED ORDER — DULOXETINE HCL 60 MG PO CPEP
60.0000 mg | ORAL_CAPSULE | Freq: Every day | ORAL | 0 refills | Status: DC
Start: 2022-06-08 — End: 2022-09-08

## 2022-06-08 MED ORDER — LAMOTRIGINE 200 MG PO TABS
200.0000 mg | ORAL_TABLET | Freq: Every day | ORAL | 0 refills | Status: DC
Start: 2022-06-08 — End: 2022-09-08

## 2022-06-08 MED ORDER — RISPERIDONE 1 MG PO TABS
1.0000 mg | ORAL_TABLET | Freq: Every day | ORAL | 2 refills | Status: DC
Start: 2022-06-08 — End: 2022-09-08
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
## 2022-06-10 ENCOUNTER — Ambulatory Visit (INDEPENDENT_AMBULATORY_CARE_PROVIDER_SITE_OTHER): Payer: PPO | Admitting: Physician Assistant

## 2022-06-10 ENCOUNTER — Encounter: Payer: Self-pay | Admitting: Physician Assistant

## 2022-06-10 VITALS — BP 130/70 | HR 79 | Temp 97.5°F | Ht 66.0 in | Wt 236.0 lb

## 2022-06-10 DIAGNOSIS — E88819 Insulin resistance, unspecified: Secondary | ICD-10-CM | POA: Diagnosis not present

## 2022-06-10 DIAGNOSIS — R42 Dizziness and giddiness: Secondary | ICD-10-CM | POA: Diagnosis not present

## 2022-06-10 LAB — CBC WITH DIFFERENTIAL/PLATELET
Basophils Absolute: 0.1 10*3/uL (ref 0.0–0.1)
Basophils Relative: 1.1 % (ref 0.0–3.0)
Eosinophils Absolute: 0.2 10*3/uL (ref 0.0–0.7)
Eosinophils Relative: 3 % (ref 0.0–5.0)
HCT: 40.3 % (ref 36.0–46.0)
Hemoglobin: 13.5 g/dL (ref 12.0–15.0)
Lymphocytes Relative: 45.6 % (ref 12.0–46.0)
Lymphs Abs: 3 10*3/uL (ref 0.7–4.0)
MCHC: 33.6 g/dL (ref 30.0–36.0)
MCV: 99.9 fl (ref 78.0–100.0)
Monocytes Absolute: 0.7 10*3/uL (ref 0.1–1.0)
Monocytes Relative: 10.7 % (ref 3.0–12.0)
Neutro Abs: 2.6 10*3/uL (ref 1.4–7.7)
Neutrophils Relative %: 39.6 % — ABNORMAL LOW (ref 43.0–77.0)
Platelets: 192 10*3/uL (ref 150.0–400.0)
RBC: 4.04 Mil/uL (ref 3.87–5.11)
RDW: 13.2 % (ref 11.5–15.5)
WBC: 6.5 10*3/uL (ref 4.0–10.5)

## 2022-06-10 LAB — COMPREHENSIVE METABOLIC PANEL
ALT: 19 U/L (ref 0–35)
AST: 16 U/L (ref 0–37)
Albumin: 4.3 g/dL (ref 3.5–5.2)
Alkaline Phosphatase: 82 U/L (ref 39–117)
BUN: 12 mg/dL (ref 6–23)
CO2: 28 mEq/L (ref 19–32)
Calcium: 9.4 mg/dL (ref 8.4–10.5)
Chloride: 103 mEq/L (ref 96–112)
Creatinine, Ser: 0.81 mg/dL (ref 0.40–1.20)
GFR: 80.45 mL/min (ref >60.00)
Glucose, Bld: 100 mg/dL — ABNORMAL HIGH (ref 70–99)
Potassium: 4.2 mEq/L (ref 3.5–5.1)
Sodium: 137 mEq/L (ref 135–145)
Total Bilirubin: 0.3 mg/dL (ref 0.2–1.2)
Total Protein: 7 g/dL (ref 6.0–8.3)

## 2022-06-10 LAB — HEMOGLOBIN A1C: Hgb A1c MFr Bld: 5.8 % (ref 4.6–6.5)

## 2022-06-10 NOTE — Progress Notes (Signed)
[name] White is a 57 y.o. female here for a new problem.  History of Present Illness:   Chief Complaint  Patient presents with   Obesity   Dizziness    Pt c/o dizziness every night when she lays down and when she gets up in the morning. Has not tried any medications    HPI  Dizziness Had an episode of dizziness x10 days ago when lying down to get in bed. Described as a room spinning sensation lasting for x1 minute. She has had similar episodes of dizziness daily since onset, only when getting out of bed in the mornings and when lying down flat in bed at night. She describes it as a combined lightheadedness and room-spinning dizziness. She reports mild headaches some mornings after waking up but nothing that has been severe. She states that she has some seasonal allergies with sinus symptoms recently. She took Zyrtec 3 times last week with temporary relief. She denies any recent changes in her medications- she has been compliant with her prescriptions. She was not taking any of her pain meds when the episode of dizziness occurred.  She had similar dizziness and sinus problems in 2018. 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  She continues to be followed by [facility] in neurology.  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 No aggressive features, significant mass effect, or interval growth. Otherwise unremarkable MRI brain without with contrast. Significant paranasal sinus disease, with both chronic and acute components. See discussion above.  She denies any vision changes, numbness, tingling, focal weakness, or changes in her chronic shortness of breath.   Obesity She states that she is drinking plenty of water and completely stopped drinking soda for the last x1.35months- she was previously drinking x12 20oz bottles of Ginger Ale per day. She is compliant with Metformin  daily with good tolerance. Lab Results  Component Value Date   HGBA1C 6.3  02/01/2022    Wt Readings from Last 10 Encounters:  06/10/22 236 lb (107 kg)  04/09/22 236 lb 12.8 oz (107.4 kg)  03/18/22 236 lb 9.6 oz (107.3 kg)  03/09/22 236 lb 9.6 oz (107.3 kg)  03/02/22 232 lb (105.2 kg)  02/01/22 241 lb 6.4 oz (109.5 kg)  01/07/22 241 lb (109.3 kg)  10/12/21 242 lb (109.8 kg)  10/05/21 242 lb (109.8 kg)  09/15/21 245 lb 12.8 oz (111.5 kg)     Past Medical History:  Diagnosis Date   Anterolisthesis    Arthritis    Barrett's esophagus    egd 2007 + 2009 but none 2011 or 08/2010 egd   Bipolar 1 disorder    CAD (coronary artery disease) 02/03/2021   a.) cCTA 02/03/2021: Ca score = 1 (72nd percentile for age/sex match control)   Chronic, continuous use of opioids    Colon polyp, hyperplastic    Depression    Diastolic dysfunction 02/06/2021   a.) TTE 02/06/2021: EF 55-60%, mild LVH, G1DD   DOE (dyspnea on exertion)    Family history of adverse reaction to anesthesia    a.) delayed emergence in 1st degree relative (brother)   Gastroparesis    Generalized anxiety disorder with panic attacks    GERD (gastroesophageal reflux disease)    Grief reaction    Hidradenitis suppurativa    History of kidney stones    HLD (hyperlipidemia)    IBS (irritable bowel syndrome)    Migraine    Morbid obesity    OSA (obstructive  sleep apnea)    a.) did not tolerate nocturnal PAP therapy mainly due to anxiety and associated xerostomia   Ovarian cyst    Palpitations    Plantar fasciitis    PTSD (post-traumatic stress disorder)    Restless leg    a.) on ropinirole   Suicide attempt 2003   Tobacco abuse    Type 2 diabetes mellitus treated without insulin      Social History   Tobacco Use   Smoking status: Every Day    Packs/day: 1.00    Years: 40.00    Additional pack years: 0.00    Total pack years: 40.00    Types: Cigarettes    Start date: 10/16/1980    Last attempt to quit: 2023    Years since quitting: 1.2   Smokeless tobacco: Never   Tobacco comments:     Has smoked off and on since the age of 89, quit while pregnant twice and also quit in 2017 but restarted in 2022  Vaping Use   Vaping Use: Never used  Substance Use Topics   Alcohol use: Not Currently    Alcohol/week: 6.0 standard drinks of alcohol    Types: 6 Standard drinks or equivalent per week    Comment: wine/liquor 2 x a month   Drug use: No    Past Surgical History:  Procedure Laterality Date   ABDOMINAL HYSTERECTOMY     secondary to endometriosis   BACK SURGERY Right 2019   lumbar ablation L3-S1 x 2    BIOPSY  05/07/2021   Procedure: BIOPSY;  Surgeon: Lemar Lofty., MD;  Location: [facility] ENDOSCOPY;  Service: Gastroenterology;;   CESAREAN SECTION     x 2   CHOLECYSTECTOMY     COLONOSCOPY W/ POLYPECTOMY  2012   COLONOSCOPY WITH PROPOFOL N/A 05/01/2020   Procedure: COLONOSCOPY WITH PROPOFOL;  Surgeon: Lemar Lofty., MD;  Location: [facility] ENDOSCOPY;  Service: Gastroenterology;  Laterality: N/A;   COLONOSCOPY WITH PROPOFOL N/A 05/07/2021   Procedure: COLONOSCOPY WITH PROPOFOL;  Surgeon: Meridee Score Netty Starring., MD;  Location: [facility] ENDOSCOPY;  Service: Gastroenterology;  Laterality: N/A;   ELBOW SURGERY Left    ENDOSCOPIC MUCOSAL RESECTION N/A 05/01/2020   Procedure: ENDOSCOPIC MUCOSAL RESECTION;  Surgeon: Meridee Score Netty Starring., MD;  Location: [facility] ENDOSCOPY;  Service: Gastroenterology;  Laterality: N/A;   ENDOSCOPIC MUCOSAL RESECTION N/A 05/07/2021   Procedure: ENDOSCOPIC MUCOSAL RESECTION;  Surgeon: Meridee Score Netty Starring., MD;  Location: [facility] ENDOSCOPY;  Service: Gastroenterology;  Laterality: N/A;   GANGLION CYST EXCISION Right    HEMOSTASIS CLIP PLACEMENT  05/01/2020   Procedure: HEMOSTASIS CLIP PLACEMENT;  Surgeon: Lemar Lofty., MD;  Location: [facility] ENDOSCOPY;  Service: Gastroenterology;;   HEMOSTASIS CLIP PLACEMENT  05/07/2021   Procedure: HEMOSTASIS CLIP PLACEMENT;  Surgeon: Lemar Lofty., MD;  Location: [facility] ENDOSCOPY;  Service:  Gastroenterology;;   HOT HEMOSTASIS N/A 05/07/2021   Procedure: HOT HEMOSTASIS (ARGON PLASMA COAGULATION/BICAP);  Surgeon: Lemar Lofty., MD;  Location: [facility] ENDOSCOPY;  Service: Gastroenterology;  Laterality: N/A;   LAPAROSCOPIC LYSIS OF ADHESIONS  04/22/2010   LAPAROSCOPY  04/30/2010   with mini laparotomy and cystotomy repair   PLANTAR FASCIA SURGERY Right    POLYPECTOMY  05/01/2020   Procedure: POLYPECTOMY;  Surgeon: Meridee Score Netty Starring., MD;  Location: [facility] ENDOSCOPY;  Service: Gastroenterology;;   POLYPECTOMY  05/07/2021   Procedure: POLYPECTOMY;  Surgeon: Lemar Lofty., MD;  Location: [facility] ENDOSCOPY;  Service: Gastroenterology;;   SALPINGECTOMY Right 04/22/2010   SHOULDER ARTHROSCOPY WITH SUBACROMIAL DECOMPRESSION,  ROTATOR CUFF REPAIR AND BICEP TENDON REPAIR Right 03/18/2022   Procedure: SHOULDER ARTHROSCOPY WITH DEBRIDEMENT, DECOMPRESSION, ROTATOR CUFF REPAIR AND BICEPS TENOLYSIS;  Surgeon: Christena Flake, MD;  Location: [facility] ORS;  Service: Orthopedics;  Laterality: Right;   SHOULDER OPEN ROTATOR CUFF REPAIR Right 03/18/2022   Procedure: OPEN ROTATOR CUFF REPAIR;  Surgeon: Christena Flake, MD;  Location: [facility] ORS;  Service: Orthopedics;  Laterality: Right;   SUBMUCOSAL LIFTING INJECTION  05/01/2020   Procedure: SUBMUCOSAL LIFTING INJECTION;  Surgeon: Meridee Score Netty Starring., MD;  Location: [facility] ENDOSCOPY;  Service: Gastroenterology;;   SUBMUCOSAL LIFTING INJECTION  05/07/2021   Procedure: SUBMUCOSAL LIFTING INJECTION;  Surgeon: Lemar Lofty., MD;  Location: [facility] ENDOSCOPY;  Service: Gastroenterology;;   TOTAL HIP ARTHROPLASTY Right 04/25/2017   Procedure: RIGHT TOTAL HIP ARTHROPLASTY ANTERIOR APPROACH;  Surgeon: Samson Frederic, MD;  Location: [facility] OR;  Service: Orthopedics;  Laterality: Right;  Needs RNFA   TUBAL LIGATION      Family History  Problem Relation Age of Onset   COPD Mother    Stroke Mother    Coronary artery disease Mother        pacer   Colon  polyps Mother    Breast cancer Mother    Migraines Mother    Other Mother        RSDS   Rheum arthritis Mother    Hypertension Father    Colon polyps Brother    Colon cancer Maternal Grandfather    Colon polyps Maternal Grandfather    Breast cancer Maternal Aunt    Uterine cancer Cousin        x 2   Aneurysm Maternal Grandmother    Kidney disease Brother    Other Brother        back pain   Kidney cancer Paternal Aunt    Breast cancer Cousin    Stomach cancer Neg Hx    Pancreatic cancer Neg Hx    Esophageal cancer Neg Hx    Liver disease Neg Hx    Inflammatory bowel disease Neg Hx     Allergies  Allergen Reactions   Morphine Hives and Nausea And Vomiting    INTOLERANCE >  N & V   Penicillins Hives, Itching and Other (See Comments)     PATIENT HAS HAD A PCN REACTION WITH IMMEDIATE RASH, FACIAL/TONGUE/THROAT SWELLING, SOB, OR LIGHTHEADEDNESS WITH HYPOTENSION:  #  #  #  YES  #  #  #   Has patient had a PCN reaction causing severe rash involving mucus membranes or skin necrosis: Unknown Has patient had a PCN reaction that required hospitalization: No Has patient had a PCN reaction occurring within the last 10 years: No If all of the above answers are "NO", then may proceed with Cephalosporin use.    Clarithromycin Nausea And Vomiting    Current Medications:   Current Outpatient Medications:    acetaminophen (TYLENOL) 500 MG tablet, Take 2,000 mg by mouth daily as needed for moderate pain., Disp: , Rfl:    dicyclomine (BENTYL) 10 MG capsule, Take 10 mg by mouth as needed for spasms., Disp: , Rfl:    DULoxetine (CYMBALTA) 60 MG capsule, Take 1 capsule (60 mg total) by mouth at bedtime., Disp: 90 capsule, Rfl: 0   furosemide (LASIX) 20 MG tablet, TAKE 1 TABLET BY MOUTH DAILY AS NEEDED FOR FLUID., Disp: 90 tablet, Rfl: 0   HYDROcodone-acetaminophen (NORCO) 7.5-325 MG tablet, Take 1 tablet by mouth 5 times daily as needed for pain, Disp:  150 tablet, Rfl: 0   lamoTRIgine  (LAMICTAL) 200 MG tablet, Take 1 tablet (200 mg total) by mouth at bedtime., Disp: 90 tablet, Rfl: 0   linaclotide (LINZESS) 145 MCG CAPS capsule, Take 1 capsule (145 mcg total) by mouth daily before breakfast., Disp: 30 capsule, Rfl: 6   metFORMIN (GLUCOPHAGE-XR) 500 MG 24 hr tablet, Take 1 tablet (500 mg total) by mouth daily with breakfast., Disp: 90 tablet, Rfl: 1   nitroGLYCERIN (NITROSTAT) 0.4 MG SL tablet, Place 1 tablet (0.4 mg total) under the tongue every 5 (five) minutes as needed for chest pain., Disp: 50 tablet, Rfl: 3   pantoprazole (PROTONIX) 40 MG tablet, TAKE 1 TABLET BY MOUTH EVERY DAY, Disp: 90 tablet, Rfl: 1   pravastatin (PRAVACHOL) 40 MG tablet, Take 1 tablet (40 mg total) by mouth at bedtime., Disp: , Rfl:    risperiDONE (RISPERDAL) 1 MG tablet, Take 1 tablet (1 mg total) by mouth at bedtime., Disp: 30 tablet, Rfl: 2   rizatriptan (MAXALT-MLT) 10 MG disintegrating tablet, Take 1 tablet (10 mg total) by mouth as needed for migraine. May repeat in 2 hours if needed.  Maximum 2 tablets in 24 hours, Disp: 12 tablet, Rfl: 11   rOPINIRole (REQUIP) 1 MG tablet, TAKE 2 TABLETS BY MOUTH AT BEDTIME, Disp: 180 tablet, Rfl: 1   topiramate (TOPAMAX) 200 MG tablet, Take 1 tablet (200 mg total) by mouth at bedtime., Disp: , Rfl:    traZODone (DESYREL) 100 MG tablet, Take 2 tablets (200 mg total) by mouth at bedtime., Disp: 60 tablet, Rfl: 2   Review of Systems:   Review of Systems  Constitutional:  Negative for fever and weight loss.  HENT:  Negative for hearing loss.   Eyes:  Negative for blurred vision and photophobia.  Respiratory:  Negative for cough and sputum production.   Cardiovascular:  Negative for chest pain and palpitations.  Genitourinary:  Negative for dysuria and hematuria.  Musculoskeletal:  Negative for joint pain and myalgias.  Skin:  Negative for itching and rash.  Neurological:  Positive for dizziness and headaches. Negative for sensory change.   Endo/Heme/Allergies:  Negative for environmental allergies. Does not bruise/bleed easily.  Psychiatric/Behavioral:  Negative for hallucinations and suicidal ideas.     Vitals:   Vitals:   06/10/22 1118  BP: 130/70  Pulse: 79  Temp: (!) 97.5 F (36.4 C)  TempSrc: Temporal  SpO2: 98%  Weight: 236 lb (107 kg)  Height: 5\' 6"  (1.676 m)     Body mass index is 38.09 kg/m.  Physical Exam:   Physical Exam Vitals and nursing note reviewed.  Constitutional:      General: She is not in acute distress.    Appearance: She is well-developed. She is not ill-appearing or toxic-appearing.  Cardiovascular:     Rate and Rhythm: Normal rate and regular rhythm.     Pulses: Normal pulses.     Heart sounds: Normal heart sounds, S1 normal and S2 normal.  Pulmonary:     Effort: Pulmonary effort is normal.     Breath sounds: Normal breath sounds.  Skin:    General: Skin is warm and dry.  Neurological:     General: No focal deficit present.     Mental Status: She is alert.     GCS: GCS eye subscore is 4. GCS verbal subscore is 5. GCS motor subscore is 6.     Cranial Nerves: Cranial nerves 2-12 are intact.     Sensory: Sensation is intact.  Motor: Motor function is intact.     Coordination: Coordination is intact.     Gait: Gait is intact.  Psychiatric:        Speech: Speech normal.        Behavior: Behavior normal. Behavior is cooperative.     Assessment and Plan:   Dizziness Neuro exam WNL Symptoms concerning for BPPV -- will vestibular PT this for patient If symptoms do not improve, or if they worsen, will have her follow-up with Dr Everlena Cooper for further evaluation  Insulin resistance Update A1c and provide recommendations accordingly Unfortunately GLP-1 RA's are not covered by her insurance Consider increasing metformin if tolerated Commended efforts on stopping sugary sodas!   I,Alexis Herring,acting as a Neurosurgeon for [employer], PA.,have documented all relevant  documentation on the behalf of Jarold Motto, PA,as directed by  Jarold Motto, PA while in the presence of Jarold Motto, [place].  I, Jarold Motto, [place], have reviewed all documentation for this visit. The documentation on 06/10/22 for the exam, diagnosis, procedures, and orders are all accurate and complete.

## 2022-06-10 NOTE — Patient Instructions (Signed)
It was great to see you!  We will update blood work today and be in touch with results  Lets plan to do physical therapy for vertigo -- if no better or any worse, let me know and we will get MRI of brain  Take care,  Jarold Motto PA-C

## 2022-06-11 ENCOUNTER — Other Ambulatory Visit: Payer: Self-pay | Admitting: Physician Assistant

## 2022-06-11 MED ORDER — METFORMIN HCL ER 500 MG PO TB24: 500.0000 mg | ORAL_TABLET | Freq: Two times a day (BID) | ORAL | 1 refills | Status: DC

## 2022-06-16 NOTE — Therapy (Addendum)
 OUTPATIENT PHYSICAL THERAPY VESTIBULAR EVALUATION     Patient Name: [name] White MRN: [id number] DOB:08/06/1965, 57 y.o., female Today's Date: 06/17/2022  END OF SESSION:  PT End of Session - 06/17/22 1145     Visit Number 1    Number of Visits 9    Date for PT Re-Evaluation 07/15/22    Authorization Type [employer]    PT Start Time 1105    PT Stop Time 1142    PT Time Calculation (min) 37 min    Activity Tolerance Patient tolerated treatment well    Behavior During Therapy [facility] for tasks assessed/performed             Past Medical History:  Diagnosis Date   Anterolisthesis    Arthritis    Barrett's esophagus    egd 2007 + 2009 but none 2011 or 08/2010 egd   Bipolar 1 disorder    CAD (coronary artery disease) 02/03/2021   a.) cCTA 02/03/2021: Ca score = 1 (72nd percentile for age/sex match control)   Chronic, continuous use of opioids    Colon polyp, hyperplastic    Depression    Diastolic dysfunction 02/06/2021   a.) TTE 02/06/2021: EF 55-60%, mild LVH, G1DD   DOE (dyspnea on exertion)    Family history of adverse reaction to anesthesia    a.) delayed emergence in 1st degree relative (brother)   Gastroparesis    Generalized anxiety disorder with panic attacks    GERD (gastroesophageal reflux disease)    Grief reaction    Hidradenitis suppurativa    History of kidney stones    HLD (hyperlipidemia)    IBS (irritable bowel syndrome)    Migraine    Morbid obesity    OSA (obstructive sleep apnea)    a.) did not tolerate nocturnal PAP therapy mainly due to anxiety and associated xerostomia   Ovarian cyst    Palpitations    Plantar fasciitis    PTSD (post-traumatic stress disorder)    Restless leg    a.) on ropinirole   Suicide attempt 2003   Tobacco abuse    Type 2 diabetes mellitus treated without insulin    Past Surgical History:  Procedure Laterality Date   ABDOMINAL HYSTERECTOMY     secondary to endometriosis   BACK SURGERY Right 2019   lumbar  ablation L3-S1 x 2    BIOPSY  05/07/2021   Procedure: BIOPSY;  Surgeon: Lemar Lofty., MD;  Location: [facility] ENDOSCOPY;  Service: Gastroenterology;;   CESAREAN SECTION     x 2   CHOLECYSTECTOMY     COLONOSCOPY W/ POLYPECTOMY  2012   COLONOSCOPY WITH PROPOFOL N/A 05/01/2020   Procedure: COLONOSCOPY WITH PROPOFOL;  Surgeon: Lemar Lofty., MD;  Location: [facility] ENDOSCOPY;  Service: Gastroenterology;  Laterality: N/A;   COLONOSCOPY WITH PROPOFOL N/A 05/07/2021   Procedure: COLONOSCOPY WITH PROPOFOL;  Surgeon: Meridee Score Netty Starring., MD;  Location: [facility] ENDOSCOPY;  Service: Gastroenterology;  Laterality: N/A;   ELBOW SURGERY Left    ENDOSCOPIC MUCOSAL RESECTION N/A 05/01/2020   Procedure: ENDOSCOPIC MUCOSAL RESECTION;  Surgeon: Meridee Score Netty Starring., MD;  Location: [facility] ENDOSCOPY;  Service: Gastroenterology;  Laterality: N/A;   ENDOSCOPIC MUCOSAL RESECTION N/A 05/07/2021   Procedure: ENDOSCOPIC MUCOSAL RESECTION;  Surgeon: Meridee Score Netty Starring., MD;  Location: [facility] ENDOSCOPY;  Service: Gastroenterology;  Laterality: N/A;   GANGLION CYST EXCISION Right    HEMOSTASIS CLIP PLACEMENT  05/01/2020   Procedure: HEMOSTASIS CLIP PLACEMENT;  Surgeon: Meridee Score Netty Starring., MD;  Location: [facility] ENDOSCOPY;  Service: Gastroenterology;;   HEMOSTASIS CLIP PLACEMENT  05/07/2021   Procedure: HEMOSTASIS CLIP PLACEMENT;  Surgeon: Lemar Lofty., MD;  Location: [facility] ENDOSCOPY;  Service: Gastroenterology;;   HOT HEMOSTASIS N/A 05/07/2021   Procedure: HOT HEMOSTASIS (ARGON PLASMA COAGULATION/BICAP);  Surgeon: Lemar Lofty., MD;  Location: [facility] ENDOSCOPY;  Service: Gastroenterology;  Laterality: N/A;   LAPAROSCOPIC LYSIS OF ADHESIONS  04/22/2010   LAPAROSCOPY  04/30/2010   with mini laparotomy and cystotomy repair   PLANTAR FASCIA SURGERY Right    POLYPECTOMY  05/01/2020   Procedure: POLYPECTOMY;  Surgeon: Mansouraty, Netty Starring., MD;  Location: [facility] ENDOSCOPY;  Service: Gastroenterology;;    POLYPECTOMY  05/07/2021   Procedure: POLYPECTOMY;  Surgeon: Lemar Lofty., MD;  Location: [facility] ENDOSCOPY;  Service: Gastroenterology;;   SALPINGECTOMY Right 04/22/2010   SHOULDER ARTHROSCOPY WITH SUBACROMIAL DECOMPRESSION, ROTATOR CUFF REPAIR AND BICEP TENDON REPAIR Right 03/18/2022   Procedure: SHOULDER ARTHROSCOPY WITH DEBRIDEMENT, DECOMPRESSION, ROTATOR CUFF REPAIR AND BICEPS TENOLYSIS;  Surgeon: Christena Flake, MD;  Location: [facility] ORS;  Service: Orthopedics;  Laterality: Right;   SHOULDER OPEN ROTATOR CUFF REPAIR Right 03/18/2022   Procedure: OPEN ROTATOR CUFF REPAIR;  Surgeon: Christena Flake, MD;  Location: [facility] ORS;  Service: Orthopedics;  Laterality: Right;   SUBMUCOSAL LIFTING INJECTION  05/01/2020   Procedure: SUBMUCOSAL LIFTING INJECTION;  Surgeon: Meridee Score Netty Starring., MD;  Location: [facility] ENDOSCOPY;  Service: Gastroenterology;;   SUBMUCOSAL LIFTING INJECTION  05/07/2021   Procedure: SUBMUCOSAL LIFTING INJECTION;  Surgeon: Lemar Lofty., MD;  Location: [facility] ENDOSCOPY;  Service: Gastroenterology;;   TOTAL HIP ARTHROPLASTY Right 04/25/2017   Procedure: RIGHT TOTAL HIP ARTHROPLASTY ANTERIOR APPROACH;  Surgeon: Samson Frederic, MD;  Location: [facility] OR;  Service: Orthopedics;  Laterality: Right;  Needs RNFA   TUBAL LIGATION     Patient Active Problem List   Diagnosis Date Noted   Tobacco abuse    Dry mouth    Cecal polyp 04/04/2020   Abnormal colonoscopy 04/04/2020   Family history of colonic polyps 04/04/2020   Irritable bowel syndrome with both constipation and diarrhea 02/28/2019   PTSD (post-traumatic stress disorder) 02/28/2019   Bipolar 1 disorder 02/28/2019   Insulin resistance 04/23/2018   Morbid obesity (HCC), BMI > 35 with comorbid conditions 04/23/2018   Chronic bilateral low back pain 12/07/2017   Chronic constipation 12/02/2017   Primary osteoarthritis of right hip 04/25/2017   Gastroparesis 02/15/2017   Osteoarthritis 09/24/2016   Chronic pansinusitis  08/26/2016   Ilioinguinal neuralgia of right side 03/11/2015   Ilioinguinal neuralgia 03/11/2015   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   Chronic pelvic pain in female 07/06/2012   Cystocele 07/06/2012   Rectocele 07/06/2012   Hypersomnia 01/30/2012   Obstructive sleep apnea syndrome    Depressive disorder    Right lumbosacral radiculopathy 07/10/2010   Dyslipidemia 05/05/2009   Chronic headache disorder 09/28/2007    PCP:    Jarold Motto, PA   REFERRING PROVIDER:    Jarold Motto, PA    REFERRING DIAG: R42 (ICD-10-CM) - Dizziness  THERAPY DIAG:  BPPV (benign paroxysmal positional vertigo), left  Dizziness and giddiness  Unsteadiness on feet  ONSET DATE: 2 weeks  Rationale for Evaluation and Treatment: Rehabilitation  SUBJECTIVE:   SUBJECTIVE STATEMENT: Patient reports dizziness for the past 2 weeks. First noticed it when laying down to go to bed. Episodes are described as spinning and last 15-20 seconds. Worse with laying down and getting out of bed, bending down. Reports sinus infection "just here recently" and still ongoing  and reports migraines which are periodic. Has been waking up with a HA and dizziness recently and notes some blurred vision. Denies head trauma, hearing loss, tinnitus.  Pt accompanied by: self  PERTINENT HISTORY: Anterolisthesis, bipolar, CAD, depression, anxiety with panic attacks, GERD, HLD, migraine, PTSD, DMII, R RTC repair, R THA  PAIN:  Are you having pain? R shoulder and back pain- reports from recent shoulder surgery  PRECAUTIONS: None  WEIGHT BEARING RESTRICTIONS: No  FALLS: Has patient fallen in last 6 months? No  LIVING ENVIRONMENT: Lives with: lives alone Lives in: Mobile home Stairs:  ramp to enter Has following equipment at home: Single point cane  PLOF: Independent with basic ADLs; on disability- was getting assistance with household tasks such as cleaning and bahting  PATIENT GOALS: improve dizziness    OBJECTIVE:   DIAGNOSTIC FINDINGS: none recent   COGNITION: Overall cognitive status: Within functional limits for tasks assessed   SENSATION: WFL  POSTURE:  rounded shoulders   LOWER EXTREMITY MMT:   MMT Right eval Left eval  Hip flexion    Hip abduction    Hip adduction    Hip internal rotation    Hip external rotation    Knee flexion    Knee extension    Ankle dorsiflexion    Ankle plantarflexion    Ankle inversion    Ankle eversion    (Blank rows = not tested)  GAIT: Gait pattern:  slow, guarded, hip instability  Assistive device utilized: None Level of assistance: Modified independence   PATIENT SURVEYS:  FOTO 39; predicted 60  VESTIBULAR ASSESSMENT:  GENERAL OBSERVATION: pt wears bifocals all day  OCULOMOTOR EXAM:  Ocular Alignment: normal  Ocular ROM: No Limitations  Spontaneous Nystagmus: absent  Gaze-Induced Nystagmus: absent  Smooth Pursuits: intact  Saccades:  very slight undershooting to the R  Convergence/Divergence: ~ 1 foot away    VESTIBULAR - OCULAR REFLEX:   Slow VOR: Normal; c/o "feels like there are rocks in my head'  VOR Cancellation: Normal  Head-Impulse Test: HIT Right: positive HIT Left: positive      POSITIONAL TESTING:  Right Roll Test: negative;  Left Roll Test: negative; c/o transient dizziness   Left Sidelying: geotropic nystagmus and dizziness upon laying down and sitting up; Duration: 20 sec Right Sidelying: negative; c/o dizziness upon sitting up    Left Dix-Hallpike: L upbeating torsional nystagmus; Duration: 20 sec    MOTION SENSITIVITY:  Motion Sensitivity Quotient Intensity: 0 = none, 1 = Lightheaded, 2 = Mild, 3 = Moderate, 4 = Severe, 5 = Vomiting  Intensity  1. Sitting to supine   2. Supine to L side   3. Supine to R side   4. Supine to sitting   5. L Hallpike-Dix   6. Up from L    7. R Hallpike-Dix   8. Up from R    9. Sitting, head tipped to L knee   10. Head up from L knee   11.  Sitting, head tipped to R knee   12. Head up from R knee   13. Sitting head turns x5   14.Sitting head nods x5   15. In stance, 180 turn to L    16. In stance, 180 turn to R       VESTIBULAR TREATMENT:  DATE: 06/17/22  Canalith Repositioning:  Epley Left: Number of Reps: 1, Response to Treatment: symptoms improved, and Comment: subsequent L sidelying test brought on dizziness only on sit up, not laying down however L upbeating torsional nystagmus evident    PATIENT EDUCATION: Education details: prognosis, POC, edu on BPPV risk factors and typical tx course, handout on BPPV Person educated: Patient Education method: Explanation, Demonstration, Tactile cues, Verbal cues, and Handouts Education comprehension: verbalized understanding and returned demonstration  HOME EXERCISE PROGRAM:  GOALS: Goals reviewed with patient? Yes  SHORT TERM GOALS: Target date: 07/01/2022  Patient to be independent with initial HEP. Baseline: HEP initiated Goal status: INITIAL    LONG TERM GOALS: Target date: 07/15/2022  Patient to be independent with advanced HEP. Baseline: Not yet initiated  Goal status: INITIAL  Patient will report 0/10 dizziness with bed mobility.  Baseline: Symptomatic  Goal status: INITIAL  Patient to demonstrate mild-moderate sway with M-CTSIB condition with eyes closed/foam surface in order to improve safety in environments with uneven surfaces and dim lighting. Baseline: NT Goal status: INITIAL  Patient to score at least 20/24 on DGI in order to decrease risk of falls. Baseline: NT Goal status: INITIAL  Patient to score at least 60 on FOTO in order to indicate improved functional outcomes.  Baseline: 39 Goal status: INITIAL   ASSESSMENT:  CLINICAL IMPRESSION:   Patient is a 57 y/o F presenting to OPPT with c/o dizziness for the past 2 weeks.Worse with laying  down and getting out of bed, bending down. Patient also reports ongoing sinus infection, recent blurred vision, and hx migraines. Denies head trauma, hearing loss, tinnitus. Patient today presenting with slight undershooting with R saccades, convergence insufficiency, positive B HIT, positive L DH, gait deviations.  Oculomotor abnormality may be d/t pt's hx of migraines, thus will treat and refer back if patient does not respond to treatment. Patient was treated with L Epley which improved symptoms somewhat. Would benefit from skilled PT services 1-2x/week for 4 weeks to address aforementioned impairments in order to optimize level of function.    OBJECTIVE IMPAIRMENTS: Abnormal gait, decreased activity tolerance, decreased balance, and dizziness.   ACTIVITY LIMITATIONS: standing, stairs, transfers, bed mobility, bathing, toileting, dressing, and hygiene/grooming  PARTICIPATION LIMITATIONS: meal prep, cleaning, laundry, driving, shopping, community activity, and church  PERSONAL FACTORS: Age, Past/current experiences, Time since onset of injury/illness/exacerbation, and 3+ comorbidities: Anterolisthesis, bipolar, CAD, depression, anxiety with panic attacks, GERD, HLD, migraine, PTSD, DMII, R RTC repair, R THA  are also affecting patient's functional outcome.   REHAB POTENTIAL: Good  CLINICAL DECISION MAKING: Evolving/moderate complexity  EVALUATION COMPLEXITY: Moderate   PLAN:  PT FREQUENCY: 1-2x/week  PT DURATION: 4 weeks  PLANNED INTERVENTIONS: Therapeutic exercises, Therapeutic activity, Neuromuscular re-education, Balance training, Gait training, Patient/Family education, Self Care, Joint mobilization, Stair training, Vestibular training, Canalith repositioning, Aquatic Therapy, Dry Needling, Electrical stimulation, Cryotherapy, Moist heat, Taping, Manual therapy, and Re-evaluation  PLAN FOR NEXT SESSION: reassess L DH; DGI, MCTSIB   Anette Guarneri, PT, DPT 06/17/22 11:57  AM  [facility] at [facility] [address] [place], [place] [address] Phone # [phone] Fax # [phone]      .PHYSICAL THERAPY DISCHARGE SUMMARY  Visits from Start of Care: 1  Current functional level related to goals / functional outcomes: See above clinical impression; pt did not return   Remaining deficits: See above   Education / Equipment: edu  Plan: Patient agrees to discharge.  Patient goals  were not met. Patient is being discharged due to not returning      Baldemar Friday, [place], DPT 05/09/23 4:05 PM  [facility] at [facility] [address] [place], [address] [place], [place] [address] Phone # [phone] Fax # [phone]

## 2022-06-17 ENCOUNTER — Ambulatory Visit: Payer: PPO | Attending: Physician Assistant | Admitting: Physical Therapy

## 2022-06-17 ENCOUNTER — Other Ambulatory Visit: Payer: Self-pay

## 2022-06-17 DIAGNOSIS — R2681 Unsteadiness on feet: Secondary | ICD-10-CM | POA: Insufficient documentation

## 2022-06-17 DIAGNOSIS — H8112 Benign paroxysmal vertigo, left ear: Secondary | ICD-10-CM | POA: Diagnosis not present

## 2022-06-17 DIAGNOSIS — R42 Dizziness and giddiness: Secondary | ICD-10-CM | POA: Insufficient documentation

## 2022-06-18 DIAGNOSIS — M25511 Pain in right shoulder: Secondary | ICD-10-CM | POA: Diagnosis not present

## 2022-06-18 DIAGNOSIS — Z9889 Other specified postprocedural states: Secondary | ICD-10-CM | POA: Diagnosis not present

## 2022-06-23 ENCOUNTER — Encounter: Payer: Self-pay | Admitting: Physician Assistant

## 2022-06-24 ENCOUNTER — Encounter: Payer: PPO | Admitting: Physical Therapy

## 2022-06-24 NOTE — Telephone Encounter (Signed)
Spoke to pt told her per Lelon Mast, This is concerning to hear.   New and/or significantly worsening bowel and bladder function with her history of back pain can be a medical emergency.   My recommendation would be actually to go to the ER to be evaluated and then reach out to [place] as she would probably benefit from a urogynecology referral however I'd like for her to evaluated first to make sure there is no neurological emergency. Told pt if she goes to [facility] ER that wait hopefully will not be as long. Pt verbalized understanding.

## 2022-06-25 NOTE — Therapy (Signed)
  OUTPATIENT PHYSICAL THERAPY VESTIBULAR TREATMENT     Patient Name: [name] White MRN: [id number] DOB:1965-05-29, 57 y.o., female Today's Date: 06/28/2022 PCP:    Jarold Motto, PA   REFERRING PROVIDER:    Jarold Motto, PA    REFERRING DIAG: R42 (ICD-10-CM) - Dizziness  THERAPY DIAG:  BPPV (benign paroxysmal positional vertigo), left  Dizziness and giddiness  Unsteadiness on feet  ONSET DATE: 2 weeks  Rationale for Evaluation and Treatment: Rehabilitation  SUBJECTIVE:   SUBJECTIVE STATEMENT: Reports "I still get dizzy when I get up on my R side. It's not doing it on my L side." It's better. Reports that she has had B&B issues every other day and was instructed to go to the ED but she does not want to sit in the ED so she did not go. Denies N/T in [place], saddle anesthesia. Reports back pain is not any worse than usual.   Pt accompanied by: self  PERTINENT HISTORY: Anterolisthesis, bipolar, CAD, depression, anxiety with panic attacks, GERD, HLD, migraine, PTSD, DMII, R RTC repair, R THA  PAIN:  Are you having pain? Yes: NPRS scale: 6/10 Pain location: R shoulder and LB Pain description: dull, aching Aggravating factors: movement Relieving factors: ice    Patient arrived to session after sending PCP MyChart message about loss of B&B issues on 06/23/22. She was advised to be seen in the ED but states that she was not willing to go d/t not wanting to wait. Advised that patient try to get in to be seen by PCP in that [place] in order to assess for possible emergent problem vs. Pelvic floor muscle weakness. Provided info on [facility] for pelvic floor rehab. Patient reported understanding. Patient left without being seen.   Anette Guarneri, PT, DPT 06/28/22 1:37 PM  [facility] at [facility] [address] [place], [address] [place], [place] [address] Phone # [phone] Fax # [phone]

## 2022-06-28 ENCOUNTER — Ambulatory Visit: Payer: PPO | Admitting: Physical Therapy

## 2022-06-28 ENCOUNTER — Encounter: Payer: Self-pay | Admitting: Physical Therapy

## 2022-06-28 ENCOUNTER — Telehealth: Payer: Self-pay | Admitting: Physical Therapy

## 2022-06-28 DIAGNOSIS — R2681 Unsteadiness on feet: Secondary | ICD-10-CM

## 2022-06-28 DIAGNOSIS — R42 Dizziness and giddiness: Secondary | ICD-10-CM | POA: Insufficient documentation

## 2022-06-28 DIAGNOSIS — H8112 Benign paroxysmal vertigo, left ear: Secondary | ICD-10-CM

## 2022-06-28 NOTE — Telephone Encounter (Signed)
[name] Hess,  [name] arrived to her PT session today after reporting loss of B&B control to you in My Chart message on 06/23/22. She reports that she did not go to the ED for examination as you instructed d/t not wanting to wait, thus encouraged her to make an appointment with your office as soon as possible. We will defer PT treatment until she is assessed for this. Please advise if any concerns.  Thanks!   Anette Guarneri, PT, DPT 06/28/22 1:40 PM  [facility] at [facility] [address] [place], [address] [place], [place] [address] Phone # [phone] Fax # [phone]

## 2022-06-29 DIAGNOSIS — Z9889 Other specified postprocedural states: Secondary | ICD-10-CM | POA: Diagnosis not present

## 2022-07-01 ENCOUNTER — Other Ambulatory Visit (HOSPITAL_COMMUNITY): Payer: Self-pay

## 2022-07-01 DIAGNOSIS — G894 Chronic pain syndrome: Secondary | ICD-10-CM | POA: Diagnosis not present

## 2022-07-01 DIAGNOSIS — M5416 Radiculopathy, lumbar region: Secondary | ICD-10-CM | POA: Diagnosis not present

## 2022-07-01 DIAGNOSIS — M47816 Spondylosis without myelopathy or radiculopathy, lumbar region: Secondary | ICD-10-CM | POA: Diagnosis not present

## 2022-07-01 DIAGNOSIS — M7061 Trochanteric bursitis, right hip: Secondary | ICD-10-CM | POA: Diagnosis not present

## 2022-07-01 MED ORDER — HYDROCODONE-ACETAMINOPHEN 7.5-325 MG PO TABS
ORAL_TABLET | ORAL | 0 refills | Status: DC
  Filled 2022-07-01 – 2022-07-05 (×2): qty 150, 30d supply, fill #0

## 2022-07-02 ENCOUNTER — Other Ambulatory Visit (HOSPITAL_COMMUNITY): Payer: Self-pay

## 2022-07-05 ENCOUNTER — Other Ambulatory Visit: Payer: Self-pay

## 2022-07-05 ENCOUNTER — Other Ambulatory Visit (HOSPITAL_COMMUNITY): Payer: Self-pay

## 2022-07-10 NOTE — Progress Notes (Deleted)
09/15/21- 55 yoF Smoker (40 pkyrs) , Disabled, for sleep evaluation courtesy of Dr Manon Hilding with concern of OSA. Medical problem list includes Migraine, Chronic Pansinusitis, Gastroparesis, GERD, IBS, Lumbar Disc Disease,  Hidradinitis, Osteoarthritis, Bipolar, Dyslipidemia, Morbid Obesity,  Covid vax- none Meds include Cymbalta, Abilify, lamictal, Requip, Dilaudid, Desyrel, Maxalt, Topamax,  HST 01/14/16- AHI 9.2/ hr, desaturation to 82%, body weight 217 lbs. Was being managed by Dr Craige Cotta on CPAP 7/ [employer] at last visit in 2020. Epworth score- Body weight today- -----Pt consult on OSA, pt reports waking up several time per night. Approx 5hr of sleep/night, does report having morning headaches and dry mouth. Not using CPAP she says over the past month because it aggravated dry mouth.  Not clear that CPAP helped sense of daytime tiredness.  Living alone with no report or about snoring or abnormal movements/parasomnia. Several psychoactive medications but she says she is not taking any specific sleep medication.  Does not know if any of her medications aggravates the dry mouth complaint.  Discussed possible trial of Biotene mouth rinse. Rolling over at night hurts back and neck-not helped by total hip replacement which she emphasizes "was the worst thing I ever did". Aware of kicking some and Requip helps. Wakes at times with dry mouth, coughing and thirsty perhaps 3 times per night. Bedtime between 10 and 11 PM, sleep latency 30 to 45 minutes, up between 730 and 8 AM.  07/12/22- 56 yoF Smoker (40 pkyrs) , Disabled, followed for mild OSA, complicated by .Migraine, Chronic Pansinusitis, Gastroparesis, GERD, IBS, Lumbar Disc Disease,  Hidradinitis, Osteoarthritis, Bipolar, Dyslipidemia, Morbid Obesity,  Meds include Cymbalta, Abilify, lamictal, Requip, Dilaudid, Desyrel, Maxalt, Topamax, HST 02/04/22-AHI 13.6/ hr, desaturation to 76%, body weight 241 lbs For treatment decision   CXR  03/02/22- IMPRESSION: No active cardiopulmonary disease.    ROS-see HPI   + = positive Constitutional:    weight loss, night sweats, fevers, chills, fatigue, lassitude. HEENT:    +headaches, difficulty swallowing, tooth/dental problems, sore throat,       sneezing, itching, ear ache, nasal congestion, post nasal drip, snoring CV:    chest pain, orthopnea, PND, swelling in lower extremities, anasarca,             dizziness, palpitations Resp:   +shortness of breath with exertion or at rest.                productive cough,   non-productive cough, coughing up of blood.              change in color of mucus.  wheezing.   Skin:    rash or lesions. GI:  +heartburn, indigestion, abdominal pain, nausea, vomiting, diarrhea,                 change in bowel habits, loss of appetite GU: dysuria, change in color of urine, no urgency or frequency.   flank pain. MS:   +joint pain, stiffness, decreased range of motion, back pain. Neuro-     nothing unusual Psych:  change in mood or affect.  +depression or +anxiety.   memory loss.  OBJ- Physical Exam General- Alert, Oriented, Affect-appropriate, Distress- none acute, +obese Skin- rash-none, lesions- none, excoriation- none Lymphadenopathy- none Head- atraumatic            Eyes- Gross vision intact, PERRLA, conjunctivae and secretions clear            Ears- Hearing, canals-normal            Nose-  Clear, no-Septal dev, mucus, polyps, erosion, perforation             Throat- Mallampati II , mucosa clear , drainage- none, tonsils- atrophic, +upper denture Neck- flexible , trachea midline, no stridor , thyroid nl, carotid no bruit Chest - symmetrical excursion , unlabored           Heart/CV- RRR , no murmur , no gallop  , no rub, nl s1 s2                           - JVD- none , edema- none, stasis changes- none, varices- none           Lung- clear to P&A, wheeze- none, cough- none , dullness-none, rub- none           Chest wall-  Abd-  Br/ Gen/ Rectal-  Not done, not indicated Extrem- cyanosis- none, clubbing, none, atrophy- none, strength- nl Neuro- grossly intact to observation

## 2022-07-12 ENCOUNTER — Other Ambulatory Visit: Payer: Self-pay

## 2022-07-12 ENCOUNTER — Ambulatory Visit: Payer: PPO | Admitting: Internal Medicine

## 2022-07-12 DIAGNOSIS — K635 Polyp of colon: Secondary | ICD-10-CM

## 2022-07-13 ENCOUNTER — Telehealth: Payer: Self-pay

## 2022-07-13 DIAGNOSIS — K635 Polyp of colon: Secondary | ICD-10-CM

## 2022-07-13 MED ORDER — NA SULFATE-K SULFATE-MG SULF 17.5-3.13-1.6 GM/177ML PO SOLN: 1.0000 | ORAL | 0 refills | Status: DC

## 2022-07-13 NOTE — Telephone Encounter (Addendum)
Patient has been scheduled for Pre-visit on 09/13/22 at 10:00 am - telephone visit. Patient has been scheduled for hospital( @ [facility])  procedure on 09/27/22. Instructions have been sent to patient via mail so that she will have information in front of her when nurse calls.

## 2022-07-28 ENCOUNTER — Other Ambulatory Visit: Payer: Self-pay | Admitting: Physician Assistant

## 2022-08-02 ENCOUNTER — Ambulatory Visit: Payer: PPO | Admitting: Physician Assistant

## 2022-08-03 ENCOUNTER — Other Ambulatory Visit (HOSPITAL_COMMUNITY): Payer: Self-pay

## 2022-08-03 DIAGNOSIS — G894 Chronic pain syndrome: Secondary | ICD-10-CM | POA: Diagnosis not present

## 2022-08-03 DIAGNOSIS — M7061 Trochanteric bursitis, right hip: Secondary | ICD-10-CM | POA: Diagnosis not present

## 2022-08-03 DIAGNOSIS — M47816 Spondylosis without myelopathy or radiculopathy, lumbar region: Secondary | ICD-10-CM | POA: Diagnosis not present

## 2022-08-03 DIAGNOSIS — Z79891 Long term (current) use of opiate analgesic: Secondary | ICD-10-CM | POA: Diagnosis not present

## 2022-08-03 DIAGNOSIS — M5416 Radiculopathy, lumbar region: Secondary | ICD-10-CM | POA: Diagnosis not present

## 2022-08-03 MED ORDER — HYDROCODONE-ACETAMINOPHEN 7.5-325 MG PO TABS
1.0000 | ORAL_TABLET | Freq: Every day | ORAL | 0 refills | Status: DC | PRN
  Filled 2022-08-03: qty 150, 30d supply, fill #0

## 2022-08-18 ENCOUNTER — Other Ambulatory Visit: Payer: Self-pay | Admitting: Physician Assistant

## 2022-08-23 ENCOUNTER — Ambulatory Visit: Payer: PPO | Admitting: Internal Medicine

## 2022-09-02 ENCOUNTER — Other Ambulatory Visit (HOSPITAL_COMMUNITY): Payer: Self-pay

## 2022-09-02 ENCOUNTER — Other Ambulatory Visit: Payer: Self-pay

## 2022-09-02 DIAGNOSIS — M47816 Spondylosis without myelopathy or radiculopathy, lumbar region: Secondary | ICD-10-CM | POA: Diagnosis not present

## 2022-09-02 DIAGNOSIS — M5416 Radiculopathy, lumbar region: Secondary | ICD-10-CM | POA: Diagnosis not present

## 2022-09-02 DIAGNOSIS — G894 Chronic pain syndrome: Secondary | ICD-10-CM | POA: Diagnosis not present

## 2022-09-02 DIAGNOSIS — M7061 Trochanteric bursitis, right hip: Secondary | ICD-10-CM | POA: Diagnosis not present

## 2022-09-02 MED ORDER — HYDROCODONE-ACETAMINOPHEN 7.5-325 MG PO TABS
1.0000 | ORAL_TABLET | Freq: Every day | ORAL | 0 refills | Status: DC | PRN
  Filled 2022-09-02: qty 150, 30d supply, fill #0

## 2022-09-08 ENCOUNTER — Telehealth (HOSPITAL_BASED_OUTPATIENT_CLINIC_OR_DEPARTMENT_OTHER): Payer: PPO | Admitting: Psychiatry

## 2022-09-08 ENCOUNTER — Encounter (HOSPITAL_COMMUNITY): Payer: Self-pay | Admitting: Psychiatry

## 2022-09-08 ENCOUNTER — Other Ambulatory Visit (HOSPITAL_COMMUNITY): Payer: Self-pay | Admitting: Psychiatry

## 2022-09-08 VITALS — Wt 235.0 lb

## 2022-09-08 DIAGNOSIS — F4310 Post-traumatic stress disorder, unspecified: Secondary | ICD-10-CM | POA: Diagnosis not present

## 2022-09-08 DIAGNOSIS — F319 Bipolar disorder, unspecified: Secondary | ICD-10-CM | POA: Diagnosis not present

## 2022-09-08 MED ORDER — LAMOTRIGINE 200 MG PO TABS
200.0000 mg | ORAL_TABLET | Freq: Every day | ORAL | 0 refills | Status: DC
Start: 2022-09-08 — End: 2022-12-10

## 2022-09-08 MED ORDER — TRAZODONE HCL 100 MG PO TABS
200.0000 mg | ORAL_TABLET | Freq: Every day | ORAL | 2 refills | Status: DC
Start: 2022-09-08 — End: 2022-12-10

## 2022-09-08 MED ORDER — DULOXETINE HCL 60 MG PO CPEP
60.0000 mg | ORAL_CAPSULE | Freq: Every day | ORAL | 0 refills | Status: DC
Start: 2022-09-08 — End: 2022-12-10

## 2022-09-08 MED ORDER — RISPERIDONE 1 MG PO TABS
1.0000 mg | ORAL_TABLET | Freq: Every day | ORAL | 2 refills | Status: DC
Start: 2022-09-08 — End: 2022-12-10
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
## 2022-09-13 ENCOUNTER — Ambulatory Visit (AMBULATORY_SURGERY_CENTER): Payer: PPO

## 2022-09-13 ENCOUNTER — Encounter: Payer: Self-pay | Admitting: Physician Assistant

## 2022-09-13 ENCOUNTER — Encounter: Payer: Self-pay | Admitting: Gastroenterology

## 2022-09-13 VITALS — Ht 66.0 in | Wt 235.0 lb

## 2022-09-13 DIAGNOSIS — Z86010 Personal history of colonic polyps: Secondary | ICD-10-CM

## 2022-09-13 NOTE — Progress Notes (Signed)
Pre visit completed via phone call; Patient verified name, DOB, and address; Patient reports she is able to ambulate without assistance;  No egg or soy allergy known to patient  No issues known to pt with past sedation with any surgeries or procedures Patient denies ever being told they had issues or difficulty with intubation  No FH of Malignant Hyperthermia Pt is not on diet pills Pt is not on home 02  Pt is not on blood thinners  Pt denies issues with constipation  No A fib or A flutter Have any cardiac testing pending--NO Pt instructed to use [email or web address] or GoodRx for a price reduction on prep   Insurance verified during PV appt=HTA  Patient's chart reviewed by Cathlyn Parsons CNRA prior to previsit and patient appropriate for the [facility].  Previsit completed and red dot placed by patient's name on their procedure day (on provider's schedule).    Patient reports she already has her instructions and is going to pick up the prep today; instructions review with the patient and questions answered;  patient informed that the pharmacy may have already placed the Suprep back on the shelf - she will need to ask them to fill the prescription again to be able to pick up the prep; patient verbalized instructions;  Patient advised to call back to the office at [phone] should questions/concerns arise;

## 2022-09-17 ENCOUNTER — Encounter (HOSPITAL_COMMUNITY): Payer: Self-pay | Admitting: Gastroenterology

## 2022-09-17 NOTE — Progress Notes (Signed)
Attempted to obtain medical history via telephone, unable to reach at this time. HIPAA compliant voicemail message left requesting return call to pre surgical testing department. 

## 2022-09-17 NOTE — Progress Notes (Signed)
09/15/21- 55 yoF Smoker (40 pkyrs) , Disabled, for sleep evaluation courtesy of Dr Manon Hilding with concern of OSA. Medical problem list includes Migraine, Chronic Pansinusitis, Gastroparesis, GERD, IBS, Lumbar Disc Disease,  Hidradinitis, Osteoarthritis, Bipolar, Dyslipidemia, Morbid Obesity,  Covid vax- none Meds include Cymbalta, Abilify, lamictal, Requip, Dilaudid, Desyrel, Maxalt, Topamax,  HST 01/14/16- AHI 9.2/ hr, desaturation to 82%, body weight 217 lbs. Was being managed by Dr Craige Cotta on CPAP 7/ [employer] at last visit in 2020. Epworth score- Body weight today- -----Pt consult on OSA, pt reports waking up several time per night. Approx 5hr of sleep/night, does report having morning headaches and dry mouth. Not using CPAP she says over the past month because it aggravated dry mouth.  Not clear that CPAP helped sense of daytime tiredness.  Living alone with no reporter about snoring or abnormal movements/parasomnia. Several psychoactive medications but she says she is not taking any specific sleep medication.  Does not know if any of her medications aggravates the dry mouth complaint.  Discussed possible trial of Biotene mouth rinse. Rolling over at night hurts back and neck-not helped by total hip replacement which she emphasizes "was the worst thing I ever did". Aware of kicking some and Requip helps. Wakes at times with dry mouth, coughing and thirsty perhaps 3 times per night. Bedtime between 10 and 11 PM, sleep latency 30 to 45 minutes, up between 730 and 8 AM.  09/20/22-  56 yoF Smoker (40 pkyrs) , Disabled, followed for mild OSA, complicated by  Migraine, Chronic Pansinusitis, Gastroparesis, GERD, IBS, Lumbar Disc Disease,  Hidradinitis, Osteoarthritis, Bipolar, Dyslipidemia, Morbid Obesity, e -Meds include Cymbalta, Abilify, lamictal, Requip, Dilaudid, Desyrel, Maxalt, Topamax,  HST 01/14/16- AHI 9.2/ hr, desaturation to 82%, body weight 217 lbs HST 02/04/22- AHI 13.6/ hr, desaturation to 76%,  body weight 241 lbs Body weight today- 243 lbs For treatment decision Says she "can't use CPAP" - citing dry mouth. Has upper denture- not a candidate for OAP. Discussed conservative measures. She agrees to CPAP desensitization first.  Obese- BMI 39.35. Discussed smoking cessation. Agrees to PFT. CXR 03/02/22 MPRESSION: No active cardiopulmonary disease. CT chest low dose screen- 11/03/21- IMPRESSION: Lung-RADS 1, negative. Continue annual screening with low-dose chest CT without contrast in 12 months. Aortic Atherosclerosis (ICD10-I70.0).  Emphysema (ICD10-J43.9).  ROS-see HPI   + = positive Constitutional:    weight loss, night sweats, fevers, chills, fatigue, lassitude. HEENT:    +headaches, difficulty swallowing, tooth/dental problems, sore throat,       sneezing, itching, ear ache, nasal congestion, post nasal drip, snoring CV:    chest pain, orthopnea, PND, swelling in lower extremities, anasarca,             dizziness, palpitations Resp:   +shortness of breath with exertion or at rest.                productive cough,   non-productive cough, coughing up of blood.              change in color of mucus.  wheezing.   Skin:    rash or lesions. GI:  +heartburn, indigestion, abdominal pain, nausea, vomiting, diarrhea,                 change in bowel habits, loss of appetite GU: dysuria, change in color of urine, no urgency or frequency.   flank pain. MS:   +joint pain, stiffness, decreased range of motion, back pain. Neuro-     nothing unusual Psych:  change  in mood or affect.  +depression or +anxiety.   memory loss.  OBJ- Physical Exam General- Alert, Oriented, Affect-appropriate, Distress- none acute, +obese Skin- rash-none, lesions- none, excoriation- none Lymphadenopathy- none Head- atraumatic            Eyes- Gross vision intact, PERRLA, conjunctivae and secretions clear            Ears- Hearing, canals-normal            Nose- Clear, no-Septal dev, mucus, polyps, erosion,  perforation             Throat- Mallampati II , mucosa clear , drainage- none, tonsils- atrophic, +upper denture Neck- flexible , trachea midline, no stridor , thyroid nl, carotid no bruit Chest - symmetrical excursion , unlabored           Heart/CV- RRR , no murmur , no gallop  , no rub, nl s1 s2                           - JVD- none , edema- none, stasis changes- none, varices- none           Lung- clear to P&A, wheeze- none, cough- none , dullness-none, rub- none           Chest wall-  Abd-  Br/ Gen/ Rectal- Not done, not indicated Extrem- cyanosis- none, clubbing, none, atrophy- none, strength- nl Neuro- grossly intact to observation

## 2022-09-20 ENCOUNTER — Encounter: Payer: Self-pay | Admitting: Internal Medicine

## 2022-09-20 ENCOUNTER — Ambulatory Visit: Payer: PPO | Admitting: Internal Medicine

## 2022-09-20 DIAGNOSIS — G4733 Obstructive sleep apnea (adult) (pediatric): Secondary | ICD-10-CM | POA: Diagnosis not present

## 2022-09-20 DIAGNOSIS — Z720 Tobacco use: Secondary | ICD-10-CM | POA: Diagnosis not present

## 2022-09-20 NOTE — Patient Instructions (Signed)
Order- schedule PFT dx tobacco user  Order- schedule mask fitting/ CPAP desensitization, including PAP Nap at sleep center  Please try to follow through with getting started with [facility]. I really think it would help you a lot.  Please work on quitting smoking

## 2022-09-22 ENCOUNTER — Encounter (INDEPENDENT_AMBULATORY_CARE_PROVIDER_SITE_OTHER): Payer: Self-pay

## 2022-09-25 ENCOUNTER — Telehealth: Payer: Self-pay | Admitting: Gastroenterology

## 2022-09-25 NOTE — Telephone Encounter (Signed)
Patient stating she never got her instructions for her colonoscopy for Monday 8/5.  Prep instructions are in her chart.  I copied them and sent them to her via MyChart message.

## 2022-09-27 ENCOUNTER — Ambulatory Visit (HOSPITAL_COMMUNITY): Payer: PPO | Admitting: Certified Registered"

## 2022-09-27 ENCOUNTER — Ambulatory Visit (HOSPITAL_COMMUNITY)
Admission: RE | Admit: 2022-09-27 | Discharge: 2022-09-27 | Disposition: A | Discharge status: Home / Self Care | Payer: PPO | Attending: Gastroenterology | Admitting: Gastroenterology

## 2022-09-27 ENCOUNTER — Other Ambulatory Visit: Payer: Self-pay

## 2022-09-27 ENCOUNTER — Ambulatory Visit (HOSPITAL_BASED_OUTPATIENT_CLINIC_OR_DEPARTMENT_OTHER): Payer: PPO | Admitting: Certified Registered"

## 2022-09-27 ENCOUNTER — Encounter (HOSPITAL_COMMUNITY): Payer: Self-pay | Admitting: Gastroenterology

## 2022-09-27 ENCOUNTER — Encounter (HOSPITAL_COMMUNITY)
Admission: RE | Disposition: A | Discharge status: Home / Self Care | Payer: Self-pay | Source: Home / Self Care | Attending: Gastroenterology

## 2022-09-27 DIAGNOSIS — E669 Obesity, unspecified: Secondary | ICD-10-CM | POA: Diagnosis not present

## 2022-09-27 DIAGNOSIS — E119 Type 2 diabetes mellitus without complications: Secondary | ICD-10-CM | POA: Diagnosis not present

## 2022-09-27 DIAGNOSIS — K644 Residual hemorrhoidal skin tags: Secondary | ICD-10-CM | POA: Diagnosis not present

## 2022-09-27 DIAGNOSIS — Z6838 Body mass index (BMI) 38.0-38.9, adult: Secondary | ICD-10-CM | POA: Insufficient documentation

## 2022-09-27 DIAGNOSIS — D127 Benign neoplasm of rectosigmoid junction: Secondary | ICD-10-CM | POA: Diagnosis not present

## 2022-09-27 DIAGNOSIS — G4733 Obstructive sleep apnea (adult) (pediatric): Secondary | ICD-10-CM | POA: Insufficient documentation

## 2022-09-27 DIAGNOSIS — K641 Second degree hemorrhoids: Secondary | ICD-10-CM | POA: Diagnosis not present

## 2022-09-27 DIAGNOSIS — Z1211 Encounter for screening for malignant neoplasm of colon: Secondary | ICD-10-CM | POA: Diagnosis not present

## 2022-09-27 DIAGNOSIS — D125 Benign neoplasm of sigmoid colon: Secondary | ICD-10-CM

## 2022-09-27 DIAGNOSIS — I2510 Atherosclerotic heart disease of native coronary artery without angina pectoris: Secondary | ICD-10-CM

## 2022-09-27 DIAGNOSIS — K635 Polyp of colon: Secondary | ICD-10-CM

## 2022-09-27 DIAGNOSIS — F17210 Nicotine dependence, cigarettes, uncomplicated: Secondary | ICD-10-CM | POA: Insufficient documentation

## 2022-09-27 DIAGNOSIS — D120 Benign neoplasm of cecum: Secondary | ICD-10-CM | POA: Insufficient documentation

## 2022-09-27 DIAGNOSIS — Z86010 Personal history of colonic polyps: Secondary | ICD-10-CM | POA: Diagnosis not present

## 2022-09-27 DIAGNOSIS — Z09 Encounter for follow-up examination after completed treatment for conditions other than malignant neoplasm: Secondary | ICD-10-CM | POA: Insufficient documentation

## 2022-09-27 DIAGNOSIS — K621 Rectal polyp: Secondary | ICD-10-CM | POA: Insufficient documentation

## 2022-09-27 DIAGNOSIS — Z7984 Long term (current) use of oral hypoglycemic drugs: Secondary | ICD-10-CM | POA: Diagnosis not present

## 2022-09-27 DIAGNOSIS — D128 Benign neoplasm of rectum: Secondary | ICD-10-CM | POA: Diagnosis not present

## 2022-09-27 HISTORY — PX: POLYPECTOMY: SHX5525

## 2022-09-27 HISTORY — PX: COLONOSCOPY WITH PROPOFOL: SHX5780

## 2022-09-27 LAB — GLUCOSE, CAPILLARY
Glucose-Capillary: 80 mg/dL (ref 70–99)
Glucose-Capillary: 91 mg/dL (ref 70–99)

## 2022-09-27 SURGERY — COLONOSCOPY WITH PROPOFOL
Anesthesia: Monitor Anesthesia Care

## 2022-09-27 MED ORDER — PROPOFOL 10 MG/ML IV BOLUS
INTRAVENOUS | Status: DC | PRN
  Administered 2022-09-27: 20 mg via INTRAVENOUS

## 2022-09-27 MED ORDER — LIDOCAINE 2% (20 MG/ML) 5 ML SYRINGE
INTRAMUSCULAR | Status: DC | PRN
  Administered 2022-09-27: 60 mg via INTRAVENOUS

## 2022-09-27 MED ORDER — LACTATED RINGERS IV SOLN: INTRAVENOUS | Status: DC

## 2022-09-27 MED ORDER — SODIUM CHLORIDE 0.9 % IV SOLN: INTRAVENOUS | Status: DC

## 2022-09-27 MED ORDER — PROPOFOL 500 MG/50ML IV EMUL
INTRAVENOUS | Status: DC | PRN
  Administered 2022-09-27: 150 ug/kg/min via INTRAVENOUS

## 2022-09-27 SURGICAL SUPPLY — 22 items

## 2022-09-27 NOTE — H&P (Signed)
GASTROENTEROLOGY PROCEDURE H&P NOTE   Primary Care Physician: Jarold Motto, PA  HPI: [name] White is a 57 y.o. female who presents for colonoscopy for follow-up s/p of the AO status post prior EMR.  Past Medical History:  Diagnosis Date   Anterolisthesis    Arthritis    Barrett's esophagus    egd 2007 + 2009 but none 2011 or 08/2010 egd   Bipolar 1 disorder (HCC)    CAD (coronary artery disease) 02/03/2021   a.) cCTA 02/03/2021: Ca score = 1 (72nd percentile for age/sex match control)   Chronic, continuous use of opioids    Colon polyp, hyperplastic    Depression    Diastolic dysfunction 02/06/2021   a.) TTE 02/06/2021: EF 55-60%, mild LVH, G1DD   DOE (dyspnea on exertion)    Family history of adverse reaction to anesthesia    a.) delayed emergence in 1st degree relative (brother)   Gastroparesis    Generalized anxiety disorder with panic attacks    GERD (gastroesophageal reflux disease)    Grief reaction    Hidradenitis suppurativa    History of kidney stones    HLD (hyperlipidemia)    IBS (irritable bowel syndrome)    Migraine    Morbid obesity (HCC)    OSA (obstructive sleep apnea)    a.) did not tolerate nocturnal PAP therapy mainly due to anxiety and associated xerostomia   Ovarian cyst    Palpitations    Plantar fasciitis    PTSD (post-traumatic stress disorder)    Restless leg    a.) on ropinirole   Suicide attempt (HCC) 2003   Tobacco abuse    Type 2 diabetes mellitus treated without insulin (HCC)    Past Surgical History:  Procedure Laterality Date   ABDOMINAL HYSTERECTOMY     secondary to endometriosis   BACK SURGERY Right 2019   lumbar ablation L3-S1 x 2    BIOPSY  05/07/2021   Procedure: BIOPSY;  Surgeon: Lemar Lofty., MD;  Location: [facility] ENDOSCOPY;  Service: Gastroenterology;;   CESAREAN SECTION     x 2   CHOLECYSTECTOMY     COLONOSCOPY W/ POLYPECTOMY  2012   COLONOSCOPY WITH PROPOFOL N/A 05/01/2020   Procedure: COLONOSCOPY  WITH PROPOFOL;  Surgeon: Lemar Lofty., MD;  Location: [facility] ENDOSCOPY;  Service: Gastroenterology;  Laterality: N/A;   COLONOSCOPY WITH PROPOFOL N/A 05/07/2021   Procedure: COLONOSCOPY WITH PROPOFOL;  Surgeon: Meridee Score Netty Starring., MD;  Location: [facility] ENDOSCOPY;  Service: Gastroenterology;  Laterality: N/A;   ELBOW SURGERY Left    ENDOSCOPIC MUCOSAL RESECTION N/A 05/01/2020   Procedure: ENDOSCOPIC MUCOSAL RESECTION;  Surgeon: Meridee Score Netty Starring., MD;  Location: [facility] ENDOSCOPY;  Service: Gastroenterology;  Laterality: N/A;   ENDOSCOPIC MUCOSAL RESECTION N/A 05/07/2021   Procedure: ENDOSCOPIC MUCOSAL RESECTION;  Surgeon: Meridee Score Netty Starring., MD;  Location: [facility] ENDOSCOPY;  Service: Gastroenterology;  Laterality: N/A;   GANGLION CYST EXCISION Right    HEMOSTASIS CLIP PLACEMENT  05/01/2020   Procedure: HEMOSTASIS CLIP PLACEMENT;  Surgeon: Lemar Lofty., MD;  Location: [facility] ENDOSCOPY;  Service: Gastroenterology;;   HEMOSTASIS CLIP PLACEMENT  05/07/2021   Procedure: HEMOSTASIS CLIP PLACEMENT;  Surgeon: Lemar Lofty., MD;  Location: [facility] ENDOSCOPY;  Service: Gastroenterology;;   HOT HEMOSTASIS N/A 05/07/2021   Procedure: HOT HEMOSTASIS (ARGON PLASMA COAGULATION/BICAP);  Surgeon: Lemar Lofty., MD;  Location: [facility] ENDOSCOPY;  Service: Gastroenterology;  Laterality: N/A;   LAPAROSCOPIC LYSIS OF ADHESIONS  04/22/2010   LAPAROSCOPY  04/30/2010   with mini  laparotomy and cystotomy repair   PLANTAR FASCIA SURGERY Right    POLYPECTOMY  05/01/2020   Procedure: POLYPECTOMY;  Surgeon: Mansouraty, Netty Starring., MD;  Location: [facility] ENDOSCOPY;  Service: Gastroenterology;;   POLYPECTOMY  05/07/2021   Procedure: POLYPECTOMY;  Surgeon: Lemar Lofty., MD;  Location: [facility] ENDOSCOPY;  Service: Gastroenterology;;   SALPINGECTOMY Right 04/22/2010   SHOULDER ARTHROSCOPY WITH SUBACROMIAL DECOMPRESSION, ROTATOR CUFF REPAIR AND BICEP TENDON REPAIR Right 03/18/2022   Procedure:  SHOULDER ARTHROSCOPY WITH DEBRIDEMENT, DECOMPRESSION, ROTATOR CUFF REPAIR AND BICEPS TENOLYSIS;  Surgeon: Christena Flake, MD;  Location: [facility] ORS;  Service: Orthopedics;  Laterality: Right;   SHOULDER OPEN ROTATOR CUFF REPAIR Right 03/18/2022   Procedure: OPEN ROTATOR CUFF REPAIR;  Surgeon: Christena Flake, MD;  Location: [facility] ORS;  Service: Orthopedics;  Laterality: Right;   SUBMUCOSAL LIFTING INJECTION  05/01/2020   Procedure: SUBMUCOSAL LIFTING INJECTION;  Surgeon: Meridee Score Netty Starring., MD;  Location: [facility] ENDOSCOPY;  Service: Gastroenterology;;   SUBMUCOSAL LIFTING INJECTION  05/07/2021   Procedure: SUBMUCOSAL LIFTING INJECTION;  Surgeon: Lemar Lofty., MD;  Location: [facility] ENDOSCOPY;  Service: Gastroenterology;;   TOTAL HIP ARTHROPLASTY Right 04/25/2017   Procedure: RIGHT TOTAL HIP ARTHROPLASTY ANTERIOR APPROACH;  Surgeon: Samson Frederic, MD;  Location: [facility] OR;  Service: Orthopedics;  Laterality: Right;  Needs RNFA   TUBAL LIGATION     Current Facility-Administered Medications  Medication Dose Route Frequency Provider Last Rate Last Admin   0.9 %  sodium chloride infusion   Intravenous Continuous Mansouraty, Netty Starring., MD        Current Facility-Administered Medications:    0.9 %  sodium chloride infusion, , Intravenous, Continuous, Mansouraty, Netty Starring., MD Allergies  Allergen Reactions   Morphine Hives and Nausea And Vomiting    INTOLERANCE >  N & V   Penicillins Hives, Itching and Other (See Comments)     PATIENT HAS HAD A PCN REACTION WITH IMMEDIATE RASH, FACIAL/TONGUE/THROAT SWELLING, SOB, OR LIGHTHEADEDNESS WITH HYPOTENSION:  #  #  #  YES  #  #  #   Has patient had a PCN reaction causing severe rash involving mucus membranes or skin necrosis: Unknown Has patient had a PCN reaction that required hospitalization: No Has patient had a PCN reaction occurring within the last 10 years: No If all of the above answers are "NO", then may proceed with Cephalosporin use.     Clarithromycin Nausea And Vomiting   Family History  Problem Relation Age of Onset   COPD Mother    Stroke Mother    Coronary artery disease Mother        pacer   Colon polyps Mother    Breast cancer Mother    Migraines Mother    Other Mother        RSDS   Rheum arthritis Mother    Hypertension Father    Colon polyps Brother    Colon cancer Maternal Grandfather    Colon polyps Maternal Grandfather    Breast cancer Maternal Aunt    Uterine cancer Cousin        x 2   Aneurysm Maternal Grandmother    Kidney disease Brother    Other Brother        back pain   Kidney cancer Paternal Aunt    Breast cancer Cousin    Stomach cancer Neg Hx    Pancreatic cancer Neg Hx    Esophageal cancer Neg Hx    Liver disease Neg Hx    Inflammatory bowel  disease Neg Hx    Social History   Socioeconomic History   Marital status: Divorced    Spouse name: Not on file   Number of children: 2   Years of education: Not on file   Highest education level: 12th grade  Occupational History   Occupation: Bartender  Tobacco Use   Smoking status: Every Day    Current packs/day: 0.00    Average packs/day: 1 pack/day for 40.4 years (40.4 ttl pk-yrs)    Types: Cigarettes    Start date: 10/16/1980    Last attempt to quit: 2023    Years since quitting: 1.5   Smokeless tobacco: Never   Tobacco comments:    Has smoked off and on since the age of 13, quit while pregnant twice and also quit in 2017 but restarted in 2022  Vaping Use   Vaping status: Never Used  Substance and Sexual Activity   Alcohol use: Not Currently    Alcohol/week: 6.0 standard drinks of alcohol    Types: 6 Standard drinks or equivalent per week    Comment: wine/liquor 2 x a month   Drug use: No   Sexual activity: Yes    Birth control/protection: Surgical    Comment: Hysterectomy  Other Topics Concern   Not on file  Social History Narrative   Occupation: bartender   Divorced  -- currently single   Alcohol use-yes -x 5 per  weekend   No longer a smoker   Social Determinants of Health   Financial Resource Strain: High Risk (06/09/2022)   Overall Financial Resource Strain (CARDIA)    Difficulty of Paying Living Expenses: Hard  Food Insecurity: Food Insecurity Present (06/09/2022)   Hunger Vital Sign    Worried About Running Out of Food in the Last Year: Often true    Ran Out of Food in the Last Year: Often true  Transportation Needs: No Transportation Needs (06/09/2022)   PRAPARE - Administrator, Civil Service (Medical): No    Lack of Transportation (Non-Medical): No  Physical Activity: Inactive (06/09/2022)   Exercise Vital Sign    Days of Exercise per Week: 0 days    Minutes of Exercise per Session: 60 min  Stress: Stress Concern Present (06/09/2022)   [employer] of Occupational Health - Occupational Stress Questionnaire    Feeling of Stress : Rather much  Social Connections: Socially Isolated (06/09/2022)   Social Connection and Isolation Panel [NHANES]    Frequency of Communication with Friends and Family: Twice a week    Frequency of Social Gatherings with Friends and Family: Once a week    Attends Religious Services: Never    Database administrator or Organizations: No    Attends Banker Meetings: Never    Marital Status: Divorced  Catering manager Violence: Not At Risk (10/05/2021)   Humiliation, Afraid, Rape, and Kick questionnaire    Fear of Current or Ex-Partner: No    Emotionally Abused: No    Physically Abused: No    Sexually Abused: No    Physical Exam: There were no vitals filed for this visit. There is no height or weight on file to calculate BMI. GEN: NAD EYE: Sclerae anicteric ENT: MMM CV: Non-tachycardic GI: Soft, NT/ND NEURO:  Alert & Oriented x 3  Lab Results: No results for input(s): "WBC", "HGB", "HCT", "PLT" in the last 72 hours. BMET No results for input(s): "NA", "K", "CL", "CO2", "GLUCOSE", "BUN", "CREATININE", "CALCIUM" in the last 72  hours. LFT  No results for input(s): "PROT", "ALBUMIN", "AST", "ALT", "ALKPHOS", "BILITOT", "BILIDIR", "IBILI" in the last 72 hours. PT/INR No results for input(s): "LABPROT", "INR" in the last 72 hours.   Impression / Plan: This is a 57 y.o.female who presents for colonoscopy for follow-up s/p of the AO status post prior EMR.  The risks and benefits of endoscopic evaluation/treatment were discussed with the patient and/or family; these include but are not limited to the risk of perforation, infection, bleeding, missed lesions, lack of diagnosis, severe illness requiring hospitalization, as well as anesthesia and sedation related illnesses.  The patient's history has been reviewed, patient examined, no change in status, and deemed stable for procedure.  The patient and/or family is agreeable to proceed.    Corliss Parish, MD  Gastroenterology Advanced Endoscopy Office # [phone]

## 2022-09-27 NOTE — Transfer of Care (Signed)
Immediate Anesthesia Transfer of Care Note  Patient: [name] White  Procedure(s) Performed: COLONOSCOPY WITH PROPOFOL POLYPECTOMY  Patient Location: PACU  Anesthesia Type:MAC  Level of Consciousness: awake, alert , and oriented  Airway & Oxygen Therapy: Patient Spontanous Breathing  Post-op Assessment: Report given to RN and Post -op Vital signs reviewed and stable  Post vital signs: Reviewed and stable  Last Vitals:  Vitals Value Taken Time  BP    Temp    Pulse    Resp    SpO2      Last Pain:  Vitals:   09/27/22 0908  TempSrc: Temporal         Complications: No notable events documented.

## 2022-09-27 NOTE — Anesthesia Preprocedure Evaluation (Addendum)
Anesthesia Evaluation  Patient identified by MRN, date of birth, ID band Patient awake    Reviewed: Allergy & Precautions, NPO status , Patient's Chart, lab work & pertinent test results  Airway Mallampati: III  TM Distance: >3 FB Neck ROM: Full    Dental  (+) Dental Advisory Given, Chipped, Poor Dentition, Missing, Edentulous Upper   Pulmonary sleep apnea , Current SmokerPatient did not abstain from smoking.   Pulmonary exam normal breath sounds clear to auscultation       Cardiovascular + CAD  Normal cardiovascular exam Rhythm:Regular Rate:Normal     Neuro/Psych  Headaches PSYCHIATRIC DISORDERS Anxiety Depression Bipolar Disorder    Neuromuscular disease    GI/Hepatic Neg liver ROS,GERD  Medicated,,  Endo/Other  diabetes, Type 2, Oral Hypoglycemic Agents  Obesity   Renal/GU negative Renal ROS     Musculoskeletal  (+) Arthritis ,  Fibromyalgia -, narcotic dependent  Abdominal   Peds  Hematology negative hematology ROS (+)   Anesthesia Other Findings Day of surgery medications reviewed with the patient.  Reproductive/Obstetrics                             Anesthesia Physical Anesthesia Plan  ASA: 3  Anesthesia Plan: MAC   Post-op Pain Management:    Induction: Intravenous  PONV Risk Score and Plan: 1 and TIVA and Treatment may vary due to age or medical condition  Airway Management Planned: Natural Airway and Simple Face Mask  Additional Equipment:   Intra-op Plan:   Post-operative Plan:   Informed Consent: I have reviewed the patients History and Physical, chart, labs and discussed the procedure including the risks, benefits and alternatives for the proposed anesthesia with the patient or authorized representative who has indicated his/her understanding and acceptance.     Dental advisory given  Plan Discussed with: CRNA and Anesthesiologist  Anesthesia Plan Comments:          Anesthesia Quick Evaluation

## 2022-09-27 NOTE — Op Note (Signed)
[facility] Patient Name: [name] White Procedure Date: 09/27/2022 MRN: [id number] Attending MD: Corliss Parish , MD, [id number] Date of Birth: [birth year] CSN: [id number] Age: 57 Admit Type: Outpatient Procedure:                Colonoscopy Indications:              High risk colon cancer surveillance: Personal                            history of colonic polyps, High risk colon cancer                            surveillance: Personal history of sessile serrated                            colon polyp (10 mm or greater in size) Providers:                Corliss Parish, MD, Eliberto Ivory, RN, Harrington Challenger, Technician Referring MD:             Napoleon Form, MD, Jarold Motto Medicines:                Monitored Anesthesia Care Complications:            No immediate complications. Estimated Blood Loss:     Estimated blood loss was minimal. Procedure:                Pre-Anesthesia Assessment:                           - Prior to the procedure, a History and Physical                            was performed, and patient medications and                            allergies were reviewed. The patient's tolerance of                            previous anesthesia was also reviewed. The risks                            and benefits of the procedure and the sedation                            options and risks were discussed with the patient.                            All questions were answered, and informed consent                            was obtained. Prior Anticoagulants: The patient has  taken no anticoagulant or antiplatelet agents                            except for NSAID medication. ASA Grade Assessment:                            II - A patient with mild systemic disease. After                            reviewing the risks and benefits, the patient was                            deemed in satisfactory  condition to undergo the                            procedure.                           After obtaining informed consent, the colonoscope                            was passed under direct vision. Throughout the                            procedure, the patient's blood pressure, pulse, and                            oxygen saturations were monitored continuously. The                            CF-HQ190L ([id number]) Olympus colonoscope was                            introduced through the anus and advanced to the 3                            cm into the ileum. The colonoscopy was performed                            without difficulty. The patient tolerated the                            procedure. The quality of the bowel preparation was                            adequate. The terminal ileum, ileocecal valve,                            appendiceal orifice, and rectum were photographed. Scope In: 10:39:08 AM Scope Out: 10:51:45 AM Scope Withdrawal Time: 0 hours 9 minutes 34 seconds  Total Procedure Duration: 0 hours 12 minutes 37 seconds  Findings:      The digital rectal exam findings include hemorrhoids. Pertinent       negatives include no palpable rectal lesions.  The terminal ileum and ileocecal valve appeared normal.      A medium post mucosectomy scar was found in the cecum and at the       appendiceal orifice. The scar tissue was healthy in appearance.      Four sessile polyps were found in the recto-sigmoid colon (2), sigmoid       colon (1) and cecum (1). The polyps were 2 to 3 mm in size. These polyps       were removed with a cold snare. Resection and retrieval were complete.      Non-bleeding non-thrombosed external and internal hemorrhoids were found       during retroflexion, during perianal exam and during digital exam. The       hemorrhoids were Grade II (internal hemorrhoids that prolapse but reduce       spontaneously). Impression:               - Hemorrhoids found  on digital rectal exam.                           - The examined portion of the ileum was normal.                           - Post mucosectomy scar in the cecum and at the                            appendiceal orifice.                           - Four, 2 to 3 mm polyps at the recto-sigmoid                            colon, in the sigmoid colon and in the cecum,                            removed with a cold snare. Resected and retrieved.                           - Non-bleeding non-thrombosed external and internal                            hemorrhoids. Moderate Sedation:      Not Applicable - Patient had care per Anesthesia. Recommendation:           - The patient will be observed post-procedure,                            until all discharge criteria are met.                           - Discharge patient to home.                           - Patient has a contact number available for                            emergencies. The signs and  symptoms of potential                            delayed complications were discussed with the                            patient. Return to normal activities tomorrow.                            Written discharge instructions were provided to the                            patient.                           - High fiber diet.                           - Use FiberCon 1-2 tablets PO daily.                           - Continue present medications.                           - Await pathology results.                           - Repeat colonoscopy in 3 years for surveillance                            due to history of previous advanced adenoma/SSP (no                            matter pathology on today's polyps). This can be                            performed by her primary gastroenterologist.                           - The findings and recommendations were discussed                            with the patient.                           - The findings  and recommendations were discussed                            with the patient's family. Procedure Code(s):        --- Professional ---                           [phone], Colonoscopy, flexible; with removal of                            tumor(s), polyp(s), or other lesion(s) by snare  technique Diagnosis Code(s):        --- Professional ---                           K64.1, Second degree hemorrhoids                           Z98.890, Other specified postprocedural states                           D12.7, Benign neoplasm of rectosigmoid junction                           D12.5, Benign neoplasm of sigmoid colon                           D12.0, Benign neoplasm of cecum                           Z86.010, Personal history of colonic polyps CPT copyright 2022 American Medical Association. All rights reserved. The codes documented in this report are preliminary and upon coder review may  be revised to meet current compliance requirements. Corliss Parish, MD 09/27/2022 11:16:04 AM Number of Addenda: 0

## 2022-09-27 NOTE — Discharge Instructions (Signed)

## 2022-09-27 NOTE — Anesthesia Postprocedure Evaluation (Signed)
Anesthesia Post Note  Patient: Laiba Golz Lafoy  Procedure(s) Performed: COLONOSCOPY WITH PROPOFOL POLYPECTOMY     Patient location during evaluation: Endoscopy Anesthesia Type: MAC Level of consciousness: oriented, awake and alert and awake Pain management: pain level controlled Vital Signs Assessment: post-procedure vital signs reviewed and stable Respiratory status: spontaneous breathing, nonlabored ventilation, respiratory function stable and patient connected to nasal cannula oxygen Cardiovascular status: blood pressure returned to baseline and stable Postop Assessment: no headache, no backache and no apparent nausea or vomiting Anesthetic complications: no   No notable events documented.  Last Vitals:  Vitals:   09/27/22 1121 09/27/22 1129  BP: 128/60 125/70  Pulse: 77 75  Resp: (!) 22 19  Temp:    SpO2: 98% 94%    Last Pain:  Vitals:   09/27/22 1129  TempSrc:   PainSc: 0-No pain                 Collene Schlichter

## 2022-09-27 NOTE — Anesthesia Procedure Notes (Signed)
Procedure Name: MAC Date/Time: 09/27/2022 10:24 AM  Performed by: Sindy Guadeloupe, CRNAPre-anesthesia Checklist: Patient identified, Emergency Drugs available, Suction available, Patient being monitored and Timeout performed Oxygen Delivery Method: Simple face mask Placement Confirmation: positive ETCO2

## 2022-09-28 ENCOUNTER — Encounter: Payer: Self-pay | Admitting: Gastroenterology

## 2022-09-28 NOTE — Progress Notes (Signed)
Post Call- patient states she has had diarrhea today; but no other symptoms. Level 3 pain, states a "slight stomach ache and able to keep food down." Patient will follow up with clinic if symptoms worsen. Alben Spittle [place]

## 2022-09-30 ENCOUNTER — Encounter (HOSPITAL_COMMUNITY): Payer: Self-pay | Admitting: Gastroenterology

## 2022-10-01 ENCOUNTER — Other Ambulatory Visit: Payer: Self-pay | Admitting: Surgery

## 2022-10-01 ENCOUNTER — Other Ambulatory Visit (HOSPITAL_COMMUNITY): Payer: Self-pay

## 2022-10-01 DIAGNOSIS — S46101D Unspecified injury of muscle, fascia and tendon of long head of biceps, right arm, subsequent encounter: Secondary | ICD-10-CM

## 2022-10-01 DIAGNOSIS — M7061 Trochanteric bursitis, right hip: Secondary | ICD-10-CM | POA: Diagnosis not present

## 2022-10-01 DIAGNOSIS — G894 Chronic pain syndrome: Secondary | ICD-10-CM | POA: Diagnosis not present

## 2022-10-01 DIAGNOSIS — M5416 Radiculopathy, lumbar region: Secondary | ICD-10-CM | POA: Diagnosis not present

## 2022-10-01 DIAGNOSIS — M47816 Spondylosis without myelopathy or radiculopathy, lumbar region: Secondary | ICD-10-CM | POA: Diagnosis not present

## 2022-10-01 DIAGNOSIS — G8929 Other chronic pain: Secondary | ICD-10-CM

## 2022-10-01 DIAGNOSIS — M75111 Incomplete rotator cuff tear or rupture of right shoulder, not specified as traumatic: Secondary | ICD-10-CM

## 2022-10-01 DIAGNOSIS — M24111 Other articular cartilage disorders, right shoulder: Secondary | ICD-10-CM

## 2022-10-01 DIAGNOSIS — M25511 Pain in right shoulder: Secondary | ICD-10-CM | POA: Diagnosis not present

## 2022-10-01 MED ORDER — HYDROMORPHONE HCL 2 MG PO TABS
2.0000 mg | ORAL_TABLET | Freq: Four times a day (QID) | ORAL | 0 refills | Status: DC | PRN
  Filled 2022-10-01: qty 120, 30d supply, fill #0

## 2022-10-05 ENCOUNTER — Ambulatory Visit
Admission: RE | Admit: 2022-10-05 | Discharge: 2022-10-05 | Disposition: A | Discharge status: Home / Self Care | Payer: PPO | Source: Ambulatory Visit | Attending: Surgery | Admitting: Surgery

## 2022-10-05 DIAGNOSIS — M24111 Other articular cartilage disorders, right shoulder: Secondary | ICD-10-CM | POA: Insufficient documentation

## 2022-10-05 DIAGNOSIS — M25511 Pain in right shoulder: Secondary | ICD-10-CM | POA: Diagnosis not present

## 2022-10-05 DIAGNOSIS — G8929 Other chronic pain: Secondary | ICD-10-CM | POA: Diagnosis not present

## 2022-10-05 DIAGNOSIS — S46811A Strain of other muscles, fascia and tendons at shoulder and upper arm level, right arm, initial encounter: Secondary | ICD-10-CM | POA: Diagnosis not present

## 2022-10-05 DIAGNOSIS — M75111 Incomplete rotator cuff tear or rupture of right shoulder, not specified as traumatic: Secondary | ICD-10-CM | POA: Diagnosis not present

## 2022-10-05 DIAGNOSIS — M7581 Other shoulder lesions, right shoulder: Secondary | ICD-10-CM | POA: Diagnosis not present

## 2022-10-05 DIAGNOSIS — S46101D Unspecified injury of muscle, fascia and tendon of long head of biceps, right arm, subsequent encounter: Secondary | ICD-10-CM | POA: Diagnosis not present

## 2022-10-05 DIAGNOSIS — M129 Arthropathy, unspecified: Secondary | ICD-10-CM | POA: Diagnosis not present

## 2022-10-10 ENCOUNTER — Encounter: Payer: Self-pay | Admitting: Internal Medicine

## 2022-10-10 NOTE — Assessment & Plan Note (Signed)
Mild OSA- until she can lose weight, CPAP is her best option. We will try for desensitization. Meanwhile, sleep off back. Plan- CPAP desensitization.

## 2022-10-10 NOTE — Assessment & Plan Note (Signed)
Support weight loss efforts

## 2022-10-10 NOTE — Assessment & Plan Note (Signed)
Counseled tobacco cessation. Meanwhile schedule PFT

## 2022-10-12 ENCOUNTER — Other Ambulatory Visit (HOSPITAL_BASED_OUTPATIENT_CLINIC_OR_DEPARTMENT_OTHER): Payer: PPO

## 2022-10-14 ENCOUNTER — Ambulatory Visit (INDEPENDENT_AMBULATORY_CARE_PROVIDER_SITE_OTHER): Payer: PPO

## 2022-10-14 VITALS — Wt 240.0 lb

## 2022-10-14 DIAGNOSIS — Z0000 Encounter for general adult medical examination without abnormal findings: Secondary | ICD-10-CM

## 2022-10-14 NOTE — Patient Instructions (Signed)
[name] White , Thank you for taking time to come for your Medicare Wellness Visit. I appreciate your ongoing commitment to your health goals. Please review the following plan we discussed and let me know if I can assist you in the future.   Referrals/Orders/Follow-Ups/Clinician Recommendations: maintain health   This is a list of the screening recommended for you and due dates:  Health Maintenance  Topic Date Due   Yearly kidney health urinalysis for diabetes  Never done   Medicare Annual Wellness Visit  10/06/2022   Flu Shot  09/23/2022   Screening for Lung Cancer  11/04/2022   Mammogram  01/06/2023   Yearly kidney function blood test for diabetes  06/10/2023   DTaP/Tdap/Td vaccine (4 - Td or Tdap) 11/27/2029   Colon Cancer Screening  09/26/2032   Hepatitis C Screening  Completed   HIV Screening  Completed   Zoster (Shingles) Vaccine  Completed   HPV Vaccine  Aged Out   COVID-19 Vaccine  Discontinued    Advanced directives: (Declined) Advance directive discussed with you today. Even though you declined this today, please call our office should you change your mind, and we can give you the proper paperwork for you to fill out.  Next Medicare Annual Wellness Visit scheduled for next year: Yes

## 2022-10-14 NOTE — Progress Notes (Signed)
Subjective:   [name] White is a 57 y.o. female who presents for Medicare Annual (Subsequent) preventive examination.  Visit Complete: Virtual  I connected with  [name] White on 10/14/22 by a audio enabled telemedicine application and verified that I am speaking with the correct person using two identifiers.  Patient Location: Home  Provider Location: Office/Clinic  I discussed the limitations of evaluation and management by telemedicine. The patient expressed understanding and agreed to proceed.    Vital Signs: Unable to obtain new vitals due to this being a telehealth visit.   Review of Systems     Cardiac Risk Factors include: dyslipidemia;obesity (BMI >30kg/m2)     Objective:    Today's Vitals   10/14/22 1303  Weight: 240 lb (108.9 kg)   Body mass index is 38.74 kg/m.     10/14/2022    1:10 PM 09/27/2022    9:15 AM 06/17/2022   11:06 AM 03/18/2022    8:23 AM 03/10/2022   12:49 PM 03/02/2022    8:15 PM 01/07/2022   10:08 AM  Advanced Directives  Does Patient Have a Medical Advance Directive? No No Yes Yes Yes No No  Does patient want to make changes to medical advance directive?    No - Patient declined     Would patient like information on creating a medical advance directive? No - Patient declined No - Patient declined    No - Patient declined     Current Medications (verified) Outpatient Encounter Medications as of 10/14/2022  Medication Sig   acetaminophen (TYLENOL) 500 MG tablet Take 2,000 mg by mouth daily as needed for moderate pain.   dicyclomine (BENTYL) 10 MG capsule Take 10 mg by mouth as needed for spasms.   DULoxetine (CYMBALTA) 60 MG capsule Take 1 capsule (60 mg total) by mouth at bedtime.   furosemide (LASIX) 20 MG tablet TAKE 1 TABLET BY MOUTH DAILY AS NEEDED FOR FLUID.   HYDROcodone-acetaminophen (NORCO) 7.5-325 MG tablet Take 1 tablet by mouth 5 (five) times daily as needed for pain.   ibuprofen (ADVIL) 800 MG tablet Take 800 mg by mouth every  8 (eight) hours as needed.   lamoTRIgine (LAMICTAL) 200 MG tablet Take 1 tablet (200 mg total) by mouth at bedtime.   linaclotide (LINZESS) 145 MCG CAPS capsule Take 1 capsule (145 mcg total) by mouth daily before breakfast.   meloxicam (MOBIC) 7.5 MG tablet Take 7.5 mg by mouth 2 (two) times daily.   metFORMIN (GLUCOPHAGE-XR) 500 MG 24 hr tablet TAKE 1 TABLET BY MOUTH EVERY DAY WITH BREAKFAST   nitroGLYCERIN (NITROSTAT) 0.4 MG SL tablet Place 1 tablet (0.4 mg total) under the tongue every 5 (five) minutes as needed for chest pain.   pantoprazole (PROTONIX) 40 MG tablet TAKE 1 TABLET BY MOUTH EVERY DAY   pravastatin (PRAVACHOL) 40 MG tablet TAKE 1 TABLET BY MOUTH EVERY DAY   risperiDONE (RISPERDAL) 1 MG tablet Take 1 tablet (1 mg total) by mouth at bedtime.   rizatriptan (MAXALT-MLT) 10 MG disintegrating tablet Take 1 tablet (10 mg total) by mouth as needed for migraine. May repeat in 2 hours if needed.  Maximum 2 tablets in 24 hours   rOPINIRole (REQUIP) 1 MG tablet TAKE 2 TABLETS BY MOUTH AT BEDTIME   topiramate (TOPAMAX) 200 MG tablet Take 1 tablet (200 mg total) by mouth at bedtime.   traZODone (DESYREL) 100 MG tablet Take 2 tablets (200 mg total) by mouth at bedtime.   [DISCONTINUED] HYDROmorphone (DILAUDID)  2 MG tablet Take 1 tablet (2 mg total) by mouth 4 (four) times daily as needed for pain.   [DISCONTINUED] Na Sulfate-K Sulfate-Mg Sulf (SUPREP BOWEL PREP KIT) 17.5-3.13-1.6 GM/177ML SOLN Take 1 kit by mouth as directed. For colonoscopy prep   No facility-administered encounter medications on file as of 10/14/2022.    Allergies (verified) Morphine, Penicillins, and Clarithromycin   History: Past Medical History:  Diagnosis Date   Anterolisthesis    Arthritis    Barrett's esophagus    egd 2007 + 2009 but none 2011 or 08/2010 egd   Bipolar 1 disorder (HCC)    CAD (coronary artery disease) 02/03/2021   a.) cCTA 02/03/2021: Ca score = 1 (72nd percentile for age/sex match control)    Chronic, continuous use of opioids    Colon polyp, hyperplastic    Depression    Diastolic dysfunction 02/06/2021   a.) TTE 02/06/2021: EF 55-60%, mild LVH, G1DD   DOE (dyspnea on exertion)    Family history of adverse reaction to anesthesia    a.) delayed emergence in 1st degree relative (brother)   Gastroparesis    Generalized anxiety disorder with panic attacks    GERD (gastroesophageal reflux disease)    Grief reaction    Hidradenitis suppurativa    History of kidney stones    HLD (hyperlipidemia)    IBS (irritable bowel syndrome)    Migraine    Morbid obesity (HCC)    OSA (obstructive sleep apnea)    a.) did not tolerate nocturnal PAP therapy mainly due to anxiety and associated xerostomia   Ovarian cyst    Palpitations    Plantar fasciitis    PTSD (post-traumatic stress disorder)    Restless leg    a.) on ropinirole   Suicide attempt (HCC) 2003   Tobacco abuse    Type 2 diabetes mellitus treated without insulin (HCC)    Past Surgical History:  Procedure Laterality Date   ABDOMINAL HYSTERECTOMY     secondary to endometriosis   BACK SURGERY Right 2019   lumbar ablation L3-S1 x 2    BIOPSY  05/07/2021   Procedure: BIOPSY;  Surgeon: Lemar Lofty., MD;  Location: [facility] ENDOSCOPY;  Service: Gastroenterology;;   CESAREAN SECTION     x 2   CHOLECYSTECTOMY     COLONOSCOPY W/ POLYPECTOMY  2012   COLONOSCOPY WITH PROPOFOL N/A 05/01/2020   Procedure: COLONOSCOPY WITH PROPOFOL;  Surgeon: Lemar Lofty., MD;  Location: [facility] ENDOSCOPY;  Service: Gastroenterology;  Laterality: N/A;   COLONOSCOPY WITH PROPOFOL N/A 05/07/2021   Procedure: COLONOSCOPY WITH PROPOFOL;  Surgeon: Meridee Score Netty Starring., MD;  Location: [facility] ENDOSCOPY;  Service: Gastroenterology;  Laterality: N/A;   COLONOSCOPY WITH PROPOFOL N/A 09/27/2022   Procedure: COLONOSCOPY WITH PROPOFOL;  Surgeon: Meridee Score Netty Starring., MD;  Location: [facility] ENDOSCOPY;  Service: Gastroenterology;  Laterality: N/A;    ELBOW SURGERY Left    ENDOSCOPIC MUCOSAL RESECTION N/A 05/01/2020   Procedure: ENDOSCOPIC MUCOSAL RESECTION;  Surgeon: Meridee Score Netty Starring., MD;  Location: [facility] ENDOSCOPY;  Service: Gastroenterology;  Laterality: N/A;   ENDOSCOPIC MUCOSAL RESECTION N/A 05/07/2021   Procedure: ENDOSCOPIC MUCOSAL RESECTION;  Surgeon: Meridee Score Netty Starring., MD;  Location: [facility] ENDOSCOPY;  Service: Gastroenterology;  Laterality: N/A;   GANGLION CYST EXCISION Right    HEMOSTASIS CLIP PLACEMENT  05/01/2020   Procedure: HEMOSTASIS CLIP PLACEMENT;  Surgeon: Lemar Lofty., MD;  Location: [facility] ENDOSCOPY;  Service: Gastroenterology;;   HEMOSTASIS CLIP PLACEMENT  05/07/2021   Procedure: HEMOSTASIS CLIP PLACEMENT;  Surgeon:  Mansouraty, Netty Starring., MD;  Location: [facility] ENDOSCOPY;  Service: Gastroenterology;;   HOT HEMOSTASIS N/A 05/07/2021   Procedure: HOT HEMOSTASIS (ARGON PLASMA COAGULATION/BICAP);  Surgeon: Lemar Lofty., MD;  Location: [facility] ENDOSCOPY;  Service: Gastroenterology;  Laterality: N/A;   LAPAROSCOPIC LYSIS OF ADHESIONS  04/22/2010   LAPAROSCOPY  04/30/2010   with mini laparotomy and cystotomy repair   PLANTAR FASCIA SURGERY Right    POLYPECTOMY  05/01/2020   Procedure: POLYPECTOMY;  Surgeon: Mansouraty, Netty Starring., MD;  Location: [facility] ENDOSCOPY;  Service: Gastroenterology;;   POLYPECTOMY  05/07/2021   Procedure: POLYPECTOMY;  Surgeon: Lemar Lofty., MD;  Location: [facility] ENDOSCOPY;  Service: Gastroenterology;;   POLYPECTOMY  09/27/2022   Procedure: POLYPECTOMY;  Surgeon: Lemar Lofty., MD;  Location: [facility] ENDOSCOPY;  Service: Gastroenterology;;   SALPINGECTOMY Right 04/22/2010   SHOULDER ARTHROSCOPY WITH SUBACROMIAL DECOMPRESSION, ROTATOR CUFF REPAIR AND BICEP TENDON REPAIR Right 03/18/2022   Procedure: SHOULDER ARTHROSCOPY WITH DEBRIDEMENT, DECOMPRESSION, ROTATOR CUFF REPAIR AND BICEPS TENOLYSIS;  Surgeon: Christena Flake, MD;  Location: [facility] ORS;  Service: Orthopedics;  Laterality:  Right;   SHOULDER OPEN ROTATOR CUFF REPAIR Right 03/18/2022   Procedure: OPEN ROTATOR CUFF REPAIR;  Surgeon: Christena Flake, MD;  Location: [facility] ORS;  Service: Orthopedics;  Laterality: Right;   SUBMUCOSAL LIFTING INJECTION  05/01/2020   Procedure: SUBMUCOSAL LIFTING INJECTION;  Surgeon: Meridee Score Netty Starring., MD;  Location: [facility] ENDOSCOPY;  Service: Gastroenterology;;   SUBMUCOSAL LIFTING INJECTION  05/07/2021   Procedure: SUBMUCOSAL LIFTING INJECTION;  Surgeon: Lemar Lofty., MD;  Location: [facility] ENDOSCOPY;  Service: Gastroenterology;;   TOTAL HIP ARTHROPLASTY Right 04/25/2017   Procedure: RIGHT TOTAL HIP ARTHROPLASTY ANTERIOR APPROACH;  Surgeon: Samson Frederic, MD;  Location: [facility] OR;  Service: Orthopedics;  Laterality: Right;  Needs RNFA   TUBAL LIGATION     Family History  Problem Relation Age of Onset   COPD Mother    Stroke Mother    Coronary artery disease Mother        pacer   Colon polyps Mother    Breast cancer Mother    Migraines Mother    Other Mother        RSDS   Rheum arthritis Mother    Hypertension Father    Colon polyps Brother    Colon cancer Maternal Grandfather    Colon polyps Maternal Grandfather    Breast cancer Maternal Aunt    Uterine cancer Cousin        x 2   Aneurysm Maternal Grandmother    Kidney disease Brother    Other Brother        back pain   Kidney cancer Paternal Aunt    Breast cancer Cousin    Stomach cancer Neg Hx    Pancreatic cancer Neg Hx    Esophageal cancer Neg Hx    Liver disease Neg Hx    Inflammatory bowel disease Neg Hx    Social History   Socioeconomic History   Marital status: Divorced    Spouse name: Not on file   Number of children: 2   Years of education: Not on file   Highest education level: 12th grade  Occupational History   Occupation: Leisure centre manager  Tobacco Use   Smoking status: Every Day    Current packs/day: 0.00    Average packs/day: 1 pack/day for 40.4 years (40.4 ttl pk-yrs)    Types: Cigarettes     Start date: 10/16/1980    Last attempt to quit: 2023  Years since quitting: 1.6   Smokeless tobacco: Never   Tobacco comments:    Has smoked off and on since the age of 27, quit while pregnant twice and also quit in 2017 but restarted in 2022  Vaping Use   Vaping status: Never Used  Substance and Sexual Activity   Alcohol use: Not Currently    Alcohol/week: 6.0 standard drinks of alcohol    Types: 6 Standard drinks or equivalent per week    Comment: wine/liquor 2 x a month   Drug use: No   Sexual activity: Yes    Birth control/protection: Surgical    Comment: Hysterectomy  Other Topics Concern   Not on file  Social History Narrative   Occupation: bartender   Divorced  -- currently single   Alcohol use-yes -x 5 per weekend   No longer a smoker   Social Determinants of Health   Financial Resource Strain: Medium Risk (10/14/2022)   Overall Financial Resource Strain (CARDIA)    Difficulty of Paying Living Expenses: Somewhat hard  Food Insecurity: Food Insecurity Present (10/14/2022)   Hunger Vital Sign    Worried About Running Out of Food in the Last Year: Often true    Ran Out of Food in the Last Year: Often true  Transportation Needs: No Transportation Needs (10/14/2022)   PRAPARE - Administrator, Civil Service (Medical): No    Lack of Transportation (Non-Medical): No  Physical Activity: Inactive (10/14/2022)   Exercise Vital Sign    Days of Exercise per Week: 0 days    Minutes of Exercise per Session: 0 min  Stress: Stress Concern Present (10/14/2022)   [employer] of Occupational Health - Occupational Stress Questionnaire    Feeling of Stress : Rather much  Social Connections: Socially Isolated (10/14/2022)   Social Connection and Isolation Panel [NHANES]    Frequency of Communication with Friends and Family: More than three times a week    Frequency of Social Gatherings with Friends and Family: Three times a week    Attends Religious Services: Never     Active Member of Clubs or Organizations: No    Attends Banker Meetings: Never    Marital Status: Divorced    Tobacco Counseling Ready to quit: Not Answered Counseling given: Not Answered Tobacco comments: Has smoked off and on since the age of 72, quit while pregnant twice and also quit in 2017 but restarted in 2022   Clinical Intake:  Pre-visit preparation completed: Yes  Pain : No/denies pain     BMI - recorded: 38.74 Nutritional Status: BMI > 30  Obese Nutritional Risks: None Diabetes: No  How often do you need to have someone help you when you read instructions, pamphlets, or other written materials from your doctor or pharmacy?: 1 - Never  Interpreter Needed?: No  Information entered by :: Lanier Ensign, LPN   Activities of Daily Living    10/14/2022    1:04 PM 03/10/2022   12:27 PM  In your present state of health, do you have any difficulty performing the following activities:  Hearing? 0   Vision? 0   Difficulty concentrating or making decisions? 0   Walking or climbing stairs? 0   Dressing or bathing? 0   Doing errands, shopping? 0 0  Preparing Food and eating ? N   Using the Toilet? N   In the past six months, have you accidently leaked urine? Y   Comment depends   Do you have problems  with loss of bowel control? N   Managing your Medications? N   Managing your Finances? N   Housekeeping or managing your Housekeeping? N     Patient Care Team: Jarold Motto, [place] as PCP - General (Physician Assistant) Quintella Reichert, MD as PCP - Cardiology (Cardiology) Hart Carwin, MD (Inactive) as Consulting Physician (Gastroenterology) Freddy Finner, MD (Inactive) as Consulting Physician (Obstetrics and Gynecology) Thyra Breed, MD (Anesthesiology) Samson Frederic, MD as Consulting Physician (Orthopedic Surgery)  Indicate any recent Medical Services you may have received from other than [facility] providers in the past year (date may be  approximate).     Assessment:   This is a routine wellness examination for [name] White.  Hearing/Vision screen Hearing Screening - Comments:: Pt denies any hearing issues  Vision Screening - Comments:: Pt follows up with Dr Shea Evans for annual eye exams   Dietary issues and exercise activities discussed:     Goals Addressed             This Visit's Progress    Patient Stated       Continue to maintain health        Depression Screen    10/14/2022    1:07 PM 10/05/2021    1:14 PM 12/02/2020   10:39 AM 04/29/2020    2:04 PM 11/28/2019   10:40 AM 11/09/2019    1:06 PM 10/04/2019   11:27 AM  PHQ 2/9 Scores  PHQ - 2 Score 2 3 0  4 0 2  PHQ- 9 Score 4 12   12        Information is confidential and restricted. Go to Review Flowsheets to unlock data.    Fall Risk    10/14/2022    1:11 PM 01/07/2022   10:07 AM 10/05/2021    1:20 PM 12/02/2020   10:39 AM 11/09/2019    1:06 PM  Fall Risk   Falls in the past year? 1 1 1  0 0  Number falls in past yr: 1 1 1  0   Injury with Fall? 1 0 1 0   Comment back  back hurt more    Risk for fall due to : Impaired balance/gait;Impaired mobility;Impaired vision;History of fall(s)  Impaired vision;Impaired balance/gait    Follow up Falls prevention discussed Falls evaluation completed Falls prevention discussed      MEDICARE RISK AT HOME: Medicare Risk at Home Any stairs in or around the home?: No If so, are there any without handrails?: No Home free of loose throw rugs in walkways, pet beds, electrical cords, etc?: Yes Adequate lighting in your home to reduce risk of falls?: Yes Life alert?: No Use of a cane, walker or w/c?: Yes Grab bars in the bathroom?: Yes Shower chair or bench in shower?: No Elevated toilet seat or a handicapped toilet?: No  TIMED UP AND GO:  Was the test performed?  No    Cognitive Function:        10/14/2022    1:12 PM 10/05/2021    1:23 PM  6CIT Screen  What Year? 0 points 0 points  What month? 0 points 0  points  What time? 0 points 0 points  Count back from 20 0 points 0 points  Months in reverse 0 points 0 points  Repeat phrase 0 points 0 points  Total Score 0 points 0 points    Immunizations Immunization History  Administered Date(s) Administered   Influenza Split 11/24/2011   Influenza Whole 11/20/2009   Influenza,inj,Quad PF,6+  Mos 01/28/2014, 12/12/2017, 02/28/2019, 11/28/2019, 12/02/2020, 10/12/2021   Pneumococcal Conjugate-13 01/28/2014   Td 11/20/2009   Td (Adult), 2 Lf Tetanus Toxid, Preservative Free 11/20/2009   Tdap 11/28/2019   Zoster Recombinant(Shingrix) 08/18/2021, 12/31/2021    TDAP status: Up to date  Flu Vaccine status: Due, Education has been provided regarding the importance of this vaccine. Advised may receive this vaccine at local pharmacy or Health Dept. Aware to provide a copy of the vaccination record if obtained from local pharmacy or Health Dept. Verbalized acceptance and understanding.    Covid-19 vaccine status: Declined, Education has been provided regarding the importance of this vaccine but patient still declined. Advised may receive this vaccine at local pharmacy or Health Dept.or vaccine clinic. Aware to provide a copy of the vaccination record if obtained from local pharmacy or Health Dept. Verbalized acceptance and understanding.  Qualifies for Shingles Vaccine? Yes   Zostavax completed Yes   Shingrix Completed?: Yes  Screening Tests Health Maintenance  Topic Date Due   Diabetic kidney evaluation - Urine ACR  Never done   INFLUENZA VACCINE  09/23/2022   Lung Cancer Screening  11/04/2022   MAMMOGRAM  01/06/2023   Diabetic kidney evaluation - eGFR measurement  06/10/2023   Medicare Annual Wellness (AWV)  10/14/2023   DTaP/Tdap/Td (4 - Td or Tdap) 11/27/2029   Colonoscopy  09/26/2032   Hepatitis C Screening  Completed   HIV Screening  Completed   Zoster Vaccines- Shingrix  Completed   HPV VACCINES  Aged Out   COVID-19 Vaccine   Discontinued    Health Maintenance  Health Maintenance Due  Topic Date Due   Diabetic kidney evaluation - Urine ACR  Never done   INFLUENZA VACCINE  09/23/2022   Lung Cancer Screening  11/04/2022    Colorectal cancer screening: Type of screening: Colonoscopy. Completed 09/27/22. Repeat every 10 years  Mammogram status: Completed 02/05/22. Repeat every year    Lung Cancer Screening: (Low Dose CT Chest recommended if Age 14-80 years, 20 pack-year currently smoking OR have quit w/in 15years.) does qualify.   Lung Cancer Screening Referral: scheduled 12/06/22 per pt   Additional Screening:  Hepatitis C Screening:  Completed 11/26/20  Vision Screening: Recommended annual ophthalmology exams for early detection of glaucoma and other disorders of the eye. Is the patient up to date with their annual eye exam?  Yes  Who is the provider or what is the name of the office in which the patient attends annual eye exams? Dr Shea Evans If pt is not established with a provider, would they like to be referred to a provider to establish care? No .   Dental Screening: Recommended annual dental exams for proper oral hygiene   Community Resource Referral / Chronic Care Management: CRR required this visit?  No   CCM required this visit?  No     Plan:     I have personally reviewed and noted the following in the patient's chart:   Medical and social history Use of alcohol, tobacco or illicit drugs  Current medications and supplements including opioid prescriptions. Patient is currently taking opioid prescriptions. Information provided to patient regarding non-opioid alternatives. Patient advised to discuss non-opioid treatment plan with their provider. Functional ability and status Nutritional status Physical activity Advanced directives List of other physicians Hospitalizations, surgeries, and ER visits in previous 12 months Vitals Screenings to include cognitive, depression, and  falls Referrals and appointments  In addition, I have reviewed and discussed with patient certain preventive protocols, quality  metrics, and best practice recommendations. A written personalized care plan for preventive services as well as general preventive health recommendations were provided to patient.     Marzella Schlein, LPN   [id number]/61/0960   After Visit Summary: (MyChart) Due to this being a telephonic visit, the after visit summary with patients personalized plan was offered to patient via MyChart   Nurse Notes: none

## 2022-10-29 ENCOUNTER — Other Ambulatory Visit: Payer: Self-pay | Admitting: Physician Assistant

## 2022-10-30 ENCOUNTER — Other Ambulatory Visit: Payer: Self-pay | Admitting: Neurology

## 2022-11-01 ENCOUNTER — Other Ambulatory Visit (HOSPITAL_COMMUNITY): Payer: Self-pay

## 2022-11-01 DIAGNOSIS — M7061 Trochanteric bursitis, right hip: Secondary | ICD-10-CM | POA: Diagnosis not present

## 2022-11-01 DIAGNOSIS — M5416 Radiculopathy, lumbar region: Secondary | ICD-10-CM | POA: Diagnosis not present

## 2022-11-01 DIAGNOSIS — G894 Chronic pain syndrome: Secondary | ICD-10-CM | POA: Diagnosis not present

## 2022-11-01 DIAGNOSIS — M47816 Spondylosis without myelopathy or radiculopathy, lumbar region: Secondary | ICD-10-CM | POA: Diagnosis not present

## 2022-11-01 MED ORDER — HYDROMORPHONE HCL 2 MG PO TABS
2.0000 mg | ORAL_TABLET | Freq: Every day | ORAL | 0 refills | Status: DC | PRN
  Filled 2022-11-01: qty 150, 30d supply, fill #0

## 2022-11-03 ENCOUNTER — Other Ambulatory Visit (HOSPITAL_COMMUNITY): Payer: Self-pay

## 2022-11-11 ENCOUNTER — Encounter: Payer: Self-pay | Admitting: Internal Medicine

## 2022-11-18 ENCOUNTER — Other Ambulatory Visit: Payer: Self-pay | Admitting: Physician Assistant

## 2022-11-26 ENCOUNTER — Ambulatory Visit
Admission: RE | Admit: 2022-11-26 | Discharge: 2022-11-26 | Disposition: A | Discharge status: Home / Self Care | Payer: PPO | Source: Ambulatory Visit | Attending: Physician Assistant | Admitting: Physician Assistant

## 2022-11-26 DIAGNOSIS — Z1231 Encounter for screening mammogram for malignant neoplasm of breast: Secondary | ICD-10-CM

## 2022-11-30 ENCOUNTER — Other Ambulatory Visit (HOSPITAL_COMMUNITY): Payer: Self-pay

## 2022-11-30 DIAGNOSIS — Z79891 Long term (current) use of opiate analgesic: Secondary | ICD-10-CM | POA: Diagnosis not present

## 2022-11-30 DIAGNOSIS — G894 Chronic pain syndrome: Secondary | ICD-10-CM | POA: Diagnosis not present

## 2022-11-30 DIAGNOSIS — M47816 Spondylosis without myelopathy or radiculopathy, lumbar region: Secondary | ICD-10-CM | POA: Diagnosis not present

## 2022-11-30 DIAGNOSIS — M5416 Radiculopathy, lumbar region: Secondary | ICD-10-CM | POA: Diagnosis not present

## 2022-11-30 DIAGNOSIS — M7061 Trochanteric bursitis, right hip: Secondary | ICD-10-CM | POA: Diagnosis not present

## 2022-11-30 MED ORDER — HYDROMORPHONE HCL 2 MG PO TABS
2.0000 mg | ORAL_TABLET | Freq: Every day | ORAL | 0 refills | Status: DC | PRN
  Filled 2022-11-30: qty 150, 30d supply, fill #0

## 2022-12-04 ENCOUNTER — Other Ambulatory Visit (HOSPITAL_COMMUNITY): Payer: Self-pay | Admitting: Psychiatry

## 2022-12-04 DIAGNOSIS — F4310 Post-traumatic stress disorder, unspecified: Secondary | ICD-10-CM

## 2022-12-07 ENCOUNTER — Other Ambulatory Visit: Payer: Self-pay | Admitting: Medical Genetics

## 2022-12-07 DIAGNOSIS — Z006 Encounter for examination for normal comparison and control in clinical research program: Secondary | ICD-10-CM

## 2022-12-08 ENCOUNTER — Telehealth (HOSPITAL_COMMUNITY): Payer: PPO | Admitting: Psychiatry

## 2022-12-10 ENCOUNTER — Other Ambulatory Visit (HOSPITAL_COMMUNITY): Payer: Self-pay

## 2022-12-10 DIAGNOSIS — F4310 Post-traumatic stress disorder, unspecified: Secondary | ICD-10-CM

## 2022-12-10 DIAGNOSIS — F319 Bipolar disorder, unspecified: Secondary | ICD-10-CM

## 2022-12-10 MED ORDER — DULOXETINE HCL 60 MG PO CPEP: 60.0000 mg | ORAL_CAPSULE | Freq: Every day | ORAL | 0 refills | Status: DC

## 2022-12-10 MED ORDER — LAMOTRIGINE 200 MG PO TABS: 200.0000 mg | ORAL_TABLET | Freq: Every day | ORAL | 0 refills | Status: DC

## 2022-12-10 MED ORDER — RISPERIDONE 1 MG PO TABS: 1.0000 mg | ORAL_TABLET | Freq: Every day | ORAL | 0 refills | Status: DC

## 2022-12-10 MED ORDER — TRAZODONE HCL 100 MG PO TABS: 200.0000 mg | ORAL_TABLET | Freq: Every day | ORAL | 0 refills | Status: DC

## 2022-12-13 DIAGNOSIS — M24111 Other articular cartilage disorders, right shoulder: Secondary | ICD-10-CM | POA: Diagnosis not present

## 2022-12-13 DIAGNOSIS — M75111 Incomplete rotator cuff tear or rupture of right shoulder, not specified as traumatic: Secondary | ICD-10-CM | POA: Diagnosis not present

## 2022-12-13 DIAGNOSIS — M7551 Bursitis of right shoulder: Secondary | ICD-10-CM | POA: Diagnosis not present

## 2022-12-13 DIAGNOSIS — M7581 Other shoulder lesions, right shoulder: Secondary | ICD-10-CM | POA: Diagnosis not present

## 2022-12-13 DIAGNOSIS — S46101S Unspecified injury of muscle, fascia and tendon of long head of biceps, right arm, sequela: Secondary | ICD-10-CM | POA: Diagnosis not present

## 2022-12-28 ENCOUNTER — Other Ambulatory Visit (HOSPITAL_COMMUNITY): Payer: Self-pay

## 2022-12-28 DIAGNOSIS — M7061 Trochanteric bursitis, right hip: Secondary | ICD-10-CM | POA: Diagnosis not present

## 2022-12-28 DIAGNOSIS — M47816 Spondylosis without myelopathy or radiculopathy, lumbar region: Secondary | ICD-10-CM | POA: Diagnosis not present

## 2022-12-28 DIAGNOSIS — G894 Chronic pain syndrome: Secondary | ICD-10-CM | POA: Diagnosis not present

## 2022-12-28 DIAGNOSIS — M5416 Radiculopathy, lumbar region: Secondary | ICD-10-CM | POA: Diagnosis not present

## 2022-12-28 MED ORDER — HYDROMORPHONE HCL 2 MG PO TABS
2.0000 mg | ORAL_TABLET | Freq: Every day | ORAL | 0 refills | Status: DC
Start: 2022-12-28 — End: 2023-07-11
  Filled 2022-12-28: qty 150, 30d supply, fill #0

## 2022-12-29 DIAGNOSIS — M47816 Spondylosis without myelopathy or radiculopathy, lumbar region: Secondary | ICD-10-CM | POA: Diagnosis not present

## 2022-12-30 DIAGNOSIS — M47816 Spondylosis without myelopathy or radiculopathy, lumbar region: Secondary | ICD-10-CM | POA: Diagnosis not present

## 2022-12-31 DIAGNOSIS — M47816 Spondylosis without myelopathy or radiculopathy, lumbar region: Secondary | ICD-10-CM | POA: Diagnosis not present

## 2023-01-01 ENCOUNTER — Other Ambulatory Visit (HOSPITAL_COMMUNITY): Payer: Self-pay | Admitting: Psychiatry

## 2023-01-01 ENCOUNTER — Other Ambulatory Visit: Payer: PPO

## 2023-01-01 DIAGNOSIS — F4310 Post-traumatic stress disorder, unspecified: Secondary | ICD-10-CM

## 2023-01-02 ENCOUNTER — Encounter: Payer: Self-pay | Admitting: Physician Assistant

## 2023-01-03 ENCOUNTER — Ambulatory Visit: Payer: Medicare Other | Admitting: Neurology

## 2023-01-03 DIAGNOSIS — G8929 Other chronic pain: Secondary | ICD-10-CM | POA: Diagnosis not present

## 2023-01-03 DIAGNOSIS — M7061 Trochanteric bursitis, right hip: Secondary | ICD-10-CM | POA: Diagnosis not present

## 2023-01-03 DIAGNOSIS — Z96641 Presence of right artificial hip joint: Secondary | ICD-10-CM | POA: Diagnosis not present

## 2023-01-07 ENCOUNTER — Encounter (HOSPITAL_COMMUNITY): Payer: Self-pay

## 2023-01-07 ENCOUNTER — Encounter: Payer: Self-pay | Admitting: Physician Assistant

## 2023-01-12 ENCOUNTER — Telehealth (HOSPITAL_BASED_OUTPATIENT_CLINIC_OR_DEPARTMENT_OTHER): Payer: PPO | Admitting: Psychiatry

## 2023-01-12 ENCOUNTER — Encounter (HOSPITAL_COMMUNITY): Payer: Self-pay | Admitting: Psychiatry

## 2023-01-12 ENCOUNTER — Telehealth: Payer: Self-pay | Admitting: *Deleted

## 2023-01-12 ENCOUNTER — Ambulatory Visit (INDEPENDENT_AMBULATORY_CARE_PROVIDER_SITE_OTHER): Payer: PPO | Admitting: Internal Medicine

## 2023-01-12 VITALS — Wt 240.0 lb

## 2023-01-12 DIAGNOSIS — F319 Bipolar disorder, unspecified: Secondary | ICD-10-CM

## 2023-01-12 DIAGNOSIS — F4310 Post-traumatic stress disorder, unspecified: Secondary | ICD-10-CM

## 2023-01-12 DIAGNOSIS — Z720 Tobacco use: Secondary | ICD-10-CM

## 2023-01-12 LAB — PULMONARY FUNCTION TEST
DL/VA % pred: 91 %
DL/VA: 3.82 ml/min/mmHg/L
DLCO cor % pred: 79 %
DLCO cor: 17.36 ml/min/mmHg
DLCO unc % pred: 79 %
DLCO unc: 17.36 ml/min/mmHg
FEF 25-75 Post: 3.76 L/s
FEF 25-75 Pre: 2.62 L/s
FEF2575-%Change-Post: 43 %
FEF2575-%Pred-Post: 147 %
FEF2575-%Pred-Pre: 102 %
FEV1-%Change-Post: 11 %
FEV1-%Pred-Post: 95 %
FEV1-%Pred-Pre: 85 %
FEV1-Post: 2.68 L
FEV1-Pre: 2.4 L
FEV1FVC-%Change-Post: 4 %
FEV1FVC-%Pred-Pre: 103 %
FEV6-%Change-Post: 6 %
FEV6-%Pred-Post: 90 %
FEV6-%Pred-Pre: 84 %
FEV6-Post: 3.15 L
FEV6-Pre: 2.95 L
FEV6FVC-%Pred-Post: 103 %
FEV6FVC-%Pred-Pre: 103 %
FVC-%Change-Post: 6 %
FVC-%Pred-Post: 87 %
FVC-%Pred-Pre: 81 %
FVC-Post: 3.15 L
FVC-Pre: 2.95 L
Post FEV1/FVC ratio: 85 %
Post FEV6/FVC ratio: 100 %
Pre FEV1/FVC ratio: 81 %
Pre FEV6/FVC Ratio: 100 %
RV % pred: 105 %
RV: 2.15 L
TLC % pred: 93 %
TLC: 4.99 L

## 2023-01-12 MED ORDER — TRAZODONE HCL 150 MG PO TABS: 150.0000 mg | ORAL_TABLET | Freq: Every day | ORAL | 2 refills | Status: DC

## 2023-01-12 MED ORDER — LAMOTRIGINE 150 MG PO TABS
150.0000 mg | ORAL_TABLET | Freq: Every day | ORAL | 2 refills | Status: DC
Start: 2023-01-12 — End: 2023-04-14

## 2023-01-12 MED ORDER — RISPERIDONE 1 MG PO TABS
1.0000 mg | ORAL_TABLET | Freq: Every day | ORAL | 2 refills | Status: DC
Start: 2023-01-12 — End: 2023-04-14

## 2023-01-12 MED ORDER — DULOXETINE HCL 60 MG PO CPEP
60.0000 mg | ORAL_CAPSULE | Freq: Every day | ORAL | 2 refills | Status: DC
Start: 2023-01-12 — End: 2023-04-14
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
## 2023-01-12 NOTE — Telephone Encounter (Signed)
Patient was scheduled back in August and didn't show up I will print and get resc

## 2023-01-12 NOTE — Progress Notes (Signed)
Full PFT performed today. °

## 2023-01-12 NOTE — Telephone Encounter (Signed)
Mask fit order in July, patient has not heard from anyone in regards to getting this scheduled.Instructions   Return in about 4 months (around 01/21/2023).    Order- schedule mask fitting/ CPAP desensitization, including PAP Nap at slg started with [facility]. I really think it would help you a lot.

## 2023-01-12 NOTE — Patient Instructions (Signed)
Full PFT performed today. °

## 2023-01-13 NOTE — Telephone Encounter (Signed)
[name] White scheduled this

## 2023-01-15 NOTE — Progress Notes (Unsigned)
HPI F Smoker (40 pkyrs) , Disabled, followed for mild OSA, complicated by  Migraine, Chronic Pansinusitis, Gastroparesis, GERD, IBS, Lumbar Disc Disease,  Hidradinitis, Osteoarthritis, Bipolar, Dyslipidemia, Morbid Obesity, e  HST 01/14/16- AHI 9.2/ hr, desaturation to 82%, body weight 217 lbs HST 02/04/22- AHI 13.6/ hr, desaturation to 76%, body weight 241 lbs  =================================================  09/20/22-  56 yoF Smoker (40 pkyrs) , Disabled, followed for mild OSA, complicated by  Migraine, Chronic Pansinusitis, Gastroparesis, GERD, IBS, Lumbar Disc Disease,  Hidradinitis, Osteoarthritis, Bipolar, Dyslipidemia, Morbid Obesity,  -Meds include Cymbalta, Abilify, lamictal, Requip, Dilaudid, Desyrel, Maxalt, Topamax,  HST 01/14/16- AHI 9.2/ hr, desaturation to 82%, body weight 217 lbs HST 02/04/22- AHI 13.6/ hr, desaturation to 76%, body weight 241 lbs Body weight today- 243 lbs For treatment decision Says she "can't use CPAP" - citing dry mouth. Has upper denture- not a candidate for OAP. Discussed conservative measures. She agrees to CPAP desensitization first.  Obese- BMI 39.35. Discussed smoking cessation. Agrees to PFT. CXR 03/02/22 MPRESSION: No active cardiopulmonary disease. CT chest low dose screen- 11/03/21- IMPRESSION: Lung-RADS 1, negative. Continue annual screening with low-dose chest CT without contrast in 12 months. Aortic Atherosclerosis (ICD10-I70.0).  Emphysema (ICD10-J43.9).  01/17/23-  57 yoF Smoker (40 pkyrs) , Disabled, followed for mild OSA, complicated by  Migraine, Chronic Pansinusitis, Gastroparesis, GERD, IBS, Lumbar Disc Disease,  Hidradinitis, Osteoarthritis, Bipolar, Dyslipidemia, Morbid Obesity, e -Meds include Cymbalta, Abilify, lamictal, Requip, Dilaudid, Desyrel, Maxalt, Topamax, Upper Denture  Mild OSA, not candidate for OAP or Inspire. Sent for CPAP desensitiztion after last visit.   CXR 03/02/22- IMPRESSION: No active cardiopulmonary  disease.  ROS-see HPI   + = positive Constitutional:    weight loss, night sweats, fevers, chills, fatigue, lassitude. HEENT:    +headaches, difficulty swallowing, tooth/dental problems, sore throat,       sneezing, itching, ear ache, nasal congestion, post nasal drip, snoring CV:    chest pain, orthopnea, PND, swelling in lower extremities, anasarca,             dizziness, palpitations Resp:   +shortness of breath with exertion or at rest.                productive cough,   non-productive cough, coughing up of blood.              change in color of mucus.  wheezing.   Skin:    rash or lesions. GI:  +heartburn, indigestion, abdominal pain, nausea, vomiting, diarrhea,                 change in bowel habits, loss of appetite GU: dysuria, change in color of urine, no urgency or frequency.   flank pain. MS:   +joint pain, stiffness, decreased range of motion, back pain. Neuro-     nothing unusual Psych:  change in mood or affect.  +depression or +anxiety.   memory loss.  OBJ- Physical Exam General- Alert, Oriented, Affect-appropriate, Distress- none acute, +obese Skin- rash-none, lesions- none, excoriation- none Lymphadenopathy- none Head- atraumatic            Eyes- Gross vision intact, PERRLA, conjunctivae and secretions clear            Ears- Hearing, canals-normal            Nose- Clear, no-Septal dev, mucus, polyps, erosion, perforation             Throat- Mallampati II , mucosa clear , drainage- none, tonsils- atrophic, +upper denture Neck- flexible ,  trachea midline, no stridor , thyroid nl, carotid no bruit Chest - symmetrical excursion , unlabored           Heart/CV- RRR , no murmur , no gallop  , no rub, nl s1 s2                           - JVD- none , edema- none, stasis changes- none, varices- none           Lung- clear to P&A, wheeze- none, cough- none , dullness-none, rub- none           Chest wall-  Abd-  Br/ Gen/ Rectal- Not done, not indicated Extrem- cyanosis- none,  clubbing, none, atrophy- none, strength- nl Neuro- grossly intact to observation

## 2023-01-17 ENCOUNTER — Encounter: Payer: Self-pay | Admitting: Internal Medicine

## 2023-01-17 ENCOUNTER — Ambulatory Visit: Payer: PPO | Admitting: Internal Medicine

## 2023-01-17 VITALS — BP 110/73 | HR 100 | Temp 98.2°F | Ht 66.0 in | Wt 233.2 lb

## 2023-01-17 DIAGNOSIS — Z23 Encounter for immunization: Secondary | ICD-10-CM | POA: Diagnosis not present

## 2023-01-17 DIAGNOSIS — G4733 Obstructive sleep apnea (adult) (pediatric): Secondary | ICD-10-CM

## 2023-01-17 DIAGNOSIS — Z2802 Immunization not carried out because of chronic illness or condition of patient: Secondary | ICD-10-CM

## 2023-01-17 NOTE — Patient Instructions (Signed)
Order flu vax standard  I hope the CPAP mask fitting is a help

## 2023-01-18 ENCOUNTER — Other Ambulatory Visit: Payer: PPO

## 2023-01-19 NOTE — Progress Notes (Signed)
NEUROLOGY FOLLOW UP OFFICE NOTE  [name] White [id number]  Assessment/Plan:   Chronic migraine without aura, without status migrainosus, not intractable  OSA  Benign paroxysmal positional vertigo   Averaging 15 headache days a month for a year, tried and/or failed topiramate, venlafaxine, lamotrigine.   Migraine prevention:  topiramate 200mg  at bedtime.  Plan to start Botox Migraine rescue:  rizatriptan 10mg  Limit use of pain relievers to no more than 2 days out of week to prevent risk of rebound or medication-overuse headache. 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    A couple of months ago, she had dizzy spells upon getting up or upon laying down.  Described as room spinning, different from her previous bouts of dizziness.  Lasts a few minutes.  Vestibular rehab was ineffective. Continued to last for several weeks but then just spontaneously resolved.  Now occurs periodically.  Current NSAIDS: None Current analgesics: Dilaudid Current triptans: Maxalt 10mg  Current ergotamine: None Current anti-emetic: None Current muscle relaxants: None Current sleep aide: trazodone Current Antihypertensive medications: Lasix Current Antidepressant medications: Cymbalta 60mg  daily, trazodone Current Anticonvulsant medications: Topiramate 200 mg at bedtime, lamotrigine  Current anti-CGRP: None Current Vitamins/Herbal/Supplements: B12 Current Antihistamines/Decongestants: None Other therapy: None Hormone/birth control: none Other meds:  ropinirole, risperidone 0.5mg  QHS   Caffeine: No Diet: Increased water, no tea or chocolate Exercise: No Depression: Yes; Anxiety:  Yes Other pain: Back and hip pain Sleep hygiene: Has OSA.  Has not started the CPAP yet.  Has upcoming appointment to get fitted for a mask.       HISTORY: Onset: Around early 52s Location:  Top of head, band-like distribution Quality:  squeezing Initial Intensity:  7/10 (sometimes 10/10):  She denies new headache, worst headache of her life or severe headache that wakes her up from sleep. Aura:  no Prodrome:  no Associated symptoms: Nausea, photophobia, phonophobia, blurred vision.  She denies unilateral numbness or weakness. Initial Duration:  4 hours or sometimes up to a day Initial Frequency:  3 to 4 times a month Triggers: Tea Relieving factors:  Sleep in dark Activity:  Needs to lay down to sleep   Past NSAIDS:  ibuprofen Past analgesics:  Tylenol, Excedrin Past abortive triptans:  sumatriptan 100mg  Past muscle relaxants:no   Past anti-emetic:  Zofran 8mg , Phenergan Past sleep aide:  melatonin, Ambien Past antihypertensive medications:  no Past antidepressant medications:  venlafaxine XR 150mg  (for depression).   Past anticonvulsant medications:  gabapentin 1200mg  three times daily, Lyrica 100mg  three times daily (both for neuropathic nerve pain from hip surgery)     Family history of headache:  Mother and grandmother had migraines.  Grandmother had aneurysm.   In 2017, she was reporting episodes of dizziness that occur suddenly, usually while walking.  It is not positional.  It is not spinning but rather a wave-like movement.  There is no associated nausea, double vision, focal numbness.  It lasts for a few minutes and resolves.  It occurs twice a day.  She takes meclizine, which is not too effective. CTA of head was unremarkable, revealing no vertebrobasilar insufficiency.  Etiology did not seem neurologic.   MRI of brain without contrast from 06/05/15 and  showed complex enlarged pineal gland over 1.9 cm with mild flattening of the superior colliculus.  Follow up MRI of brain  with contrast performed 06/11/15 revealed this to be a simple 9 mm pineal cyst which is stable compared to prior CT from 2012.    Repeat MRI of the brain with and without contrast from 07/26/16 again demonstrated stable non-enhancing pineal gland cyst, but otherwise unremarkable except for some paranasal sinus disease.  PAST MEDICAL HISTORY: Past Medical History:  Diagnosis Date   Anterolisthesis    Arthritis    Barrett's esophagus    egd 2007 + 2009 but none 2011 or 08/2010 egd   Bipolar 1 disorder (HCC)    CAD (coronary artery disease) 02/03/2021   a.) cCTA 02/03/2021: Ca score = 1 (72nd percentile for age/sex match control)   Chronic, continuous use of opioids    Colon polyp, hyperplastic    Depression    Diastolic dysfunction 02/06/2021   a.) TTE 02/06/2021: EF 55-60%, mild LVH, G1DD   DOE (dyspnea on exertion)    Family history of adverse reaction to anesthesia    a.) delayed emergence in 1st degree relative (brother)   Gastroparesis    Generalized anxiety disorder with panic attacks    GERD (gastroesophageal reflux disease)    Grief reaction    Hidradenitis suppurativa    History of kidney stones    HLD (hyperlipidemia)    IBS (irritable bowel syndrome)    Migraine    Morbid obesity (HCC)    OSA (obstructive sleep apnea)    a.) did not tolerate nocturnal PAP therapy mainly due to anxiety and associated xerostomia   Ovarian cyst    Palpitations    Plantar fasciitis    PTSD (post-traumatic stress disorder)    Restless leg    a.) on ropinirole   Suicide attempt (HCC) 2003   Tobacco abuse    Type 2 diabetes mellitus treated without insulin (HCC)     MEDICATIONS: Current Outpatient Medications on File Prior to Visit  Medication Sig Dispense Refill   acetaminophen (TYLENOL) 500 MG tablet Take 2,000 mg by mouth daily as needed for moderate pain.     dicyclomine (BENTYL) 10 MG capsule Take 10 mg by mouth as needed for spasms.     DULoxetine (CYMBALTA) 60 MG capsule Take 1  capsule (60 mg total) by mouth at bedtime. 30 capsule 2   furosemide (LASIX) 20 MG tablet TAKE 1 TABLET BY MOUTH DAILY AS NEEDED FOR FLUID. 90 tablet 0   HYDROcodone-acetaminophen (NORCO) 7.5-325 MG tablet Take 1 tablet by mouth 5 (five) times daily as needed for pain. (Patient not taking: Reported on 01/17/2023) 150 tablet 0   HYDROmorphone (DILAUDID) 2 MG tablet Take 1 tablet (2 mg total) by mouth 5 (five) times daily as needed for pain (Patient not taking: Reported on 01/17/2023) 150 tablet 0   HYDROmorphone (DILAUDID) 2 MG tablet Take 1 tablet (2 mg total) by mouth 5 (five) times daily as needed for pain. (Patient not taking: Reported on 01/17/2023) 150 tablet 0   HYDROmorphone (DILAUDID) 2 MG tablet Take 1 tablet (2 mg total) by mouth 5 (five) times daily as needed for pain 150 tablet 0   ibuprofen (ADVIL) 800 MG tablet Take 800 mg by mouth every 8 (eight) hours as needed. (Patient not taking: Reported on 01/17/2023)     lamoTRIgine (LAMICTAL) 150 MG tablet Take 1 tablet (150 mg total) by mouth at bedtime. 30 tablet 2   linaclotide (LINZESS) 145  MCG CAPS capsule Take 1 capsule (145 mcg total) by mouth daily before breakfast. 30 capsule 6   meloxicam (MOBIC) 7.5 MG tablet Take 7.5 mg by mouth 2 (two) times daily.     metFORMIN (GLUCOPHAGE-XR) 500 MG 24 hr tablet TAKE 1 TABLET BY MOUTH EVERY DAY WITH BREAKFAST 90 tablet 1   nitroGLYCERIN (NITROSTAT) 0.4 MG SL tablet Place 1 tablet (0.4 mg total) under the tongue every 5 (five) minutes as needed for chest pain. 50 tablet 3   pantoprazole (PROTONIX) 40 MG tablet TAKE 1 TABLET BY MOUTH EVERY DAY 90 tablet 1   pravastatin (PRAVACHOL) 40 MG tablet TAKE 1 TABLET BY MOUTH EVERY DAY 90 tablet 1   risperiDONE (RISPERDAL) 1 MG tablet Take 1 tablet (1 mg total) by mouth at bedtime. 30 tablet 2   rizatriptan (MAXALT-MLT) 10 MG disintegrating tablet Take 1 tablet (10 mg total) by mouth as needed for migraine. May repeat in 2 hours if needed.  Maximum 2 tablets  in 24 hours 12 tablet 11   rOPINIRole (REQUIP) 1 MG tablet TAKE 2 TABLETS BY MOUTH AT BEDTIME 180 tablet 1   topiramate (TOPAMAX) 200 MG tablet TAKE 1 TABLET BY MOUTH EVERYDAY AT BEDTIME 90 tablet 3   traZODone (DESYREL) 150 MG tablet Take 1 tablet (150 mg total) by mouth at bedtime. 30 tablet 2   No current facility-administered medications on file prior to visit.    ALLERGIES: Allergies  Allergen Reactions   Morphine Hives and Nausea And Vomiting    INTOLERANCE >  N & V   Penicillins Hives, Itching and Other (See Comments)     PATIENT HAS HAD A PCN REACTION WITH IMMEDIATE RASH, FACIAL/TONGUE/THROAT SWELLING, SOB, OR LIGHTHEADEDNESS WITH HYPOTENSION:  #  #  #  YES  #  #  #   Has patient had a PCN reaction causing severe rash involving mucus membranes or skin necrosis: Unknown Has patient had a PCN reaction that required hospitalization: No Has patient had a PCN reaction occurring within the last 10 years: No If all of the above answers are "NO", then may proceed with Cephalosporin use.    Clarithromycin Nausea And Vomiting    FAMILY HISTORY: Family History  Problem Relation Age of Onset   COPD Mother    Stroke Mother    Coronary artery disease Mother        pacer   Colon polyps Mother    Breast cancer Mother    Migraines Mother    Other Mother        RSDS   Rheum arthritis Mother    Hypertension Father    Colon polyps Brother    Colon cancer Maternal Grandfather    Colon polyps Maternal Grandfather    Breast cancer Maternal Aunt    Uterine cancer Cousin        x 2   Aneurysm Maternal Grandmother    Kidney disease Brother    Other Brother        back pain   Kidney cancer Paternal Aunt    Breast cancer Cousin    Stomach cancer Neg Hx    Pancreatic cancer Neg Hx    Esophageal cancer Neg Hx    Liver disease Neg Hx    Inflammatory bowel disease Neg Hx       Objective:  Blood pressure 111/69, pulse 88, height 5\' 5"  (1.651 m), weight 238 lb (108 kg), SpO2  98%. General: No acute distress.  Patient appears well-groomed.  Head:  Normocephalic/atraumatic Eyes:  Fundi examined but not visualized Neck: supple, bilateral paraspinal tenderness, full range of motion Heart:  Regular rate and rhythm Neurological Exam: alert and oriented.  Speech fluent and not dysarthric, language intact.  CN II-XII intact. Bulk and tone normal, muscle strength 5/5 throughout.  Sensation to light touch intact.  Deep tendon reflexes absent throughout.  Finger to nose testing intact.  Gait normal, Romberg negative.   Shon Millet, DO  CC: Jarold Motto, PA

## 2023-01-21 ENCOUNTER — Other Ambulatory Visit: Payer: Self-pay

## 2023-01-21 ENCOUNTER — Ambulatory Visit: Payer: PPO | Admitting: Internal Medicine

## 2023-01-24 ENCOUNTER — Encounter: Payer: Self-pay | Admitting: Neurology

## 2023-01-24 ENCOUNTER — Telehealth: Payer: Self-pay

## 2023-01-24 ENCOUNTER — Ambulatory Visit: Payer: PPO | Admitting: Neurology

## 2023-01-24 VITALS — BP 111/69 | HR 88 | Ht 65.0 in | Wt 238.0 lb

## 2023-01-24 DIAGNOSIS — G4733 Obstructive sleep apnea (adult) (pediatric): Secondary | ICD-10-CM | POA: Diagnosis not present

## 2023-01-24 DIAGNOSIS — G43709 Chronic migraine without aura, not intractable, without status migrainosus: Secondary | ICD-10-CM

## 2023-01-24 DIAGNOSIS — H8110 Benign paroxysmal vertigo, unspecified ear: Secondary | ICD-10-CM | POA: Diagnosis not present

## 2023-01-24 MED ORDER — RIZATRIPTAN BENZOATE 10 MG PO TBDP: 10.0000 mg | ORAL_TABLET | ORAL | 11 refills | Status: AC | PRN

## 2023-01-24 NOTE — Patient Instructions (Signed)
Plan to start Botox Continue topiramate 200mg  at bedtime Rizatriptan as needed

## 2023-01-24 NOTE — Telephone Encounter (Signed)
Patient seen in office today. Per Dr.Jaffe, Patient to start Botox.

## 2023-01-25 ENCOUNTER — Other Ambulatory Visit (HOSPITAL_COMMUNITY): Payer: Self-pay

## 2023-01-25 DIAGNOSIS — G894 Chronic pain syndrome: Secondary | ICD-10-CM | POA: Diagnosis not present

## 2023-01-25 DIAGNOSIS — M7061 Trochanteric bursitis, right hip: Secondary | ICD-10-CM | POA: Diagnosis not present

## 2023-01-25 DIAGNOSIS — M5416 Radiculopathy, lumbar region: Secondary | ICD-10-CM | POA: Diagnosis not present

## 2023-01-25 DIAGNOSIS — M47816 Spondylosis without myelopathy or radiculopathy, lumbar region: Secondary | ICD-10-CM | POA: Diagnosis not present

## 2023-01-25 MED ORDER — HYDROMORPHONE HCL 2 MG PO TABS
2.0000 mg | ORAL_TABLET | Freq: Every day | ORAL | 0 refills | Status: DC | PRN
  Filled 2023-01-25: qty 150, 30d supply, fill #0

## 2023-01-25 MED ORDER — NALOXONE HCL 4 MG/0.1ML NA LIQD
1.0000 | NASAL | 1 refills | Status: DC
  Filled 2023-01-25: qty 1, 28d supply, fill #0

## 2023-01-28 DIAGNOSIS — M47816 Spondylosis without myelopathy or radiculopathy, lumbar region: Secondary | ICD-10-CM | POA: Diagnosis not present

## 2023-01-30 DIAGNOSIS — M47816 Spondylosis without myelopathy or radiculopathy, lumbar region: Secondary | ICD-10-CM | POA: Diagnosis not present

## 2023-02-02 ENCOUNTER — Other Ambulatory Visit (HOSPITAL_BASED_OUTPATIENT_CLINIC_OR_DEPARTMENT_OTHER): Payer: PPO

## 2023-02-03 ENCOUNTER — Telehealth: Payer: Self-pay | Admitting: Pharmacy Technician

## 2023-02-03 ENCOUNTER — Other Ambulatory Visit (HOSPITAL_COMMUNITY): Payer: Self-pay

## 2023-02-03 NOTE — Telephone Encounter (Signed)
PA request has been Submitted. New Encounter created for follow up. For additional info see Pharmacy Prior Auth telephone encounter from 02/03/23.

## 2023-02-03 NOTE — Telephone Encounter (Signed)
Pharmacy Patient Advocate Encounter  [employer] verification has been submitted. Benefit Verification #:  [id number]  Pharmacy PA has been submitted for BOTOX 200u via CoverMyMeds. INSURANCE: [employer] ADVANTAGE/RX ADVANCE DATE SUBMITTED: 02/03/23 KEY: [id number] Status is pending

## 2023-02-04 ENCOUNTER — Telehealth: Payer: Self-pay | Admitting: Neurology

## 2023-02-04 ENCOUNTER — Other Ambulatory Visit: Payer: Self-pay | Admitting: Physician Assistant

## 2023-02-04 NOTE — Telephone Encounter (Signed)
Received addtional questions from insurance via fax. Checked [employer] Report. Not covered under pharmacy benefits. Answered questions and faxed back to number provided on form.

## 2023-02-04 NOTE — Telephone Encounter (Signed)
Pharmacy Patient Advocate Encounter- Botox BIV-Medical Benefit-Medicare:  Buy/Bill J code: Z6109 CPT code: 60454 Dx Code: G43.709  Primary Insurance: [employer] Date Benefits were checked: 12.12.24 Prior Auth: NOT NEEDED    Medicare patients are not eligible for Botox Copay Card.  Pharmacy Patient Advocate Encounter  Received notification from [facility] ADVANTAGE/RX ADVANCE that Prior Authorization for BOTOX 200 has been DENIED.  Full denial letter will be uploaded to the media tab. See denial reason below.    PA #/Juran ID/Reference #: [id number]

## 2023-02-04 NOTE — Telephone Encounter (Signed)
Left message with the after hour service on 02-03-23 at 4:55 pm   Caller states that he received prior auth request for medication but they are in need of more information   Did not leave where they were calling from or the name of the medication

## 2023-02-05 ENCOUNTER — Other Ambulatory Visit (HOSPITAL_COMMUNITY): Payer: Self-pay | Admitting: Psychiatry

## 2023-02-05 DIAGNOSIS — F4310 Post-traumatic stress disorder, unspecified: Secondary | ICD-10-CM

## 2023-02-08 NOTE — Telephone Encounter (Signed)
Patient advised of Buy/Bill through medical Benefit.

## 2023-02-11 ENCOUNTER — Encounter: Payer: PPO | Admitting: Physician Assistant

## 2023-02-20 ENCOUNTER — Other Ambulatory Visit: Payer: Self-pay | Admitting: Physician Assistant

## 2023-02-21 ENCOUNTER — Encounter: Payer: PPO | Admitting: Physician Assistant

## 2023-02-22 ENCOUNTER — Other Ambulatory Visit (HOSPITAL_COMMUNITY): Payer: Self-pay

## 2023-02-22 MED ORDER — HYDROMORPHONE HCL 2 MG PO TABS
2.0000 mg | ORAL_TABLET | Freq: Every day | ORAL | 0 refills | Status: DC | PRN
  Filled 2023-02-22: qty 150, 30d supply, fill #0

## 2023-02-25 ENCOUNTER — Other Ambulatory Visit (HOSPITAL_COMMUNITY): Payer: Self-pay

## 2023-03-02 DIAGNOSIS — M47816 Spondylosis without myelopathy or radiculopathy, lumbar region: Secondary | ICD-10-CM | POA: Diagnosis not present

## 2023-03-06 ENCOUNTER — Encounter: Payer: Self-pay | Admitting: Internal Medicine

## 2023-03-06 NOTE — Assessment & Plan Note (Signed)
 Benefits from CPAP but needs still to get more comfortable Plan- weight loss, sleep off back or use CPAP with mask fitting

## 2023-03-06 NOTE — Assessment & Plan Note (Signed)
 Importance of weight management to help OSA control.

## 2023-03-08 ENCOUNTER — Encounter: Payer: PPO | Admitting: Physician Assistant

## 2023-03-10 ENCOUNTER — Ambulatory Visit: Payer: PPO | Admitting: Physician Assistant

## 2023-03-11 ENCOUNTER — Ambulatory Visit: Payer: PPO | Admitting: Neurology

## 2023-03-23 LAB — HM DIABETES EYE EXAM

## 2023-03-29 ENCOUNTER — Other Ambulatory Visit (HOSPITAL_COMMUNITY): Payer: Self-pay

## 2023-03-29 ENCOUNTER — Other Ambulatory Visit: Payer: Self-pay

## 2023-03-29 DIAGNOSIS — M47816 Spondylosis without myelopathy or radiculopathy, lumbar region: Secondary | ICD-10-CM | POA: Diagnosis not present

## 2023-03-29 DIAGNOSIS — M5416 Radiculopathy, lumbar region: Secondary | ICD-10-CM | POA: Diagnosis not present

## 2023-03-29 DIAGNOSIS — G894 Chronic pain syndrome: Secondary | ICD-10-CM | POA: Diagnosis not present

## 2023-03-29 DIAGNOSIS — M7061 Trochanteric bursitis, right hip: Secondary | ICD-10-CM | POA: Diagnosis not present

## 2023-03-29 MED ORDER — HYDROMORPHONE HCL 2 MG PO TABS
2.0000 mg | ORAL_TABLET | Freq: Every day | ORAL | 0 refills | Status: DC | PRN
  Filled 2023-03-29: qty 150, 30d supply, fill #0

## 2023-03-30 ENCOUNTER — Other Ambulatory Visit: Payer: Self-pay

## 2023-04-02 DIAGNOSIS — M47816 Spondylosis without myelopathy or radiculopathy, lumbar region: Secondary | ICD-10-CM | POA: Diagnosis not present

## 2023-04-05 ENCOUNTER — Encounter: Payer: Self-pay | Admitting: Physician Assistant

## 2023-04-05 ENCOUNTER — Ambulatory Visit (INDEPENDENT_AMBULATORY_CARE_PROVIDER_SITE_OTHER): Payer: PPO | Admitting: Physician Assistant

## 2023-04-05 VITALS — BP 120/60 | HR 80 | Temp 97.6°F | Ht 65.0 in | Wt 240.0 lb

## 2023-04-05 DIAGNOSIS — E669 Obesity, unspecified: Secondary | ICD-10-CM

## 2023-04-05 DIAGNOSIS — G8929 Other chronic pain: Secondary | ICD-10-CM

## 2023-04-05 DIAGNOSIS — Z7984 Long term (current) use of oral hypoglycemic drugs: Secondary | ICD-10-CM

## 2023-04-05 DIAGNOSIS — K582 Mixed irritable bowel syndrome: Secondary | ICD-10-CM | POA: Diagnosis not present

## 2023-04-05 DIAGNOSIS — G2581 Restless legs syndrome: Secondary | ICD-10-CM | POA: Diagnosis not present

## 2023-04-05 DIAGNOSIS — Z0000 Encounter for general adult medical examination without abnormal findings: Secondary | ICD-10-CM | POA: Diagnosis not present

## 2023-04-05 DIAGNOSIS — E119 Type 2 diabetes mellitus without complications: Secondary | ICD-10-CM | POA: Diagnosis not present

## 2023-04-05 DIAGNOSIS — K219 Gastro-esophageal reflux disease without esophagitis: Secondary | ICD-10-CM | POA: Diagnosis not present

## 2023-04-05 DIAGNOSIS — Z7985 Long-term (current) use of injectable non-insulin antidiabetic drugs: Secondary | ICD-10-CM | POA: Diagnosis not present

## 2023-04-05 DIAGNOSIS — M545 Low back pain, unspecified: Secondary | ICD-10-CM

## 2023-04-05 MED ORDER — OZEMPIC (0.25 OR 0.5 MG/DOSE) 2 MG/3ML ~~LOC~~ SOPN: 0.2500 mg | PEN_INJECTOR | SUBCUTANEOUS | 1 refills | Status: DC

## 2023-04-05 NOTE — Patient Instructions (Signed)
It was great to see you!  I will complete your paperwork before end of week We will get blood work updated today I have sent in Ozempic -- this will require paperwork on our part so please be patient  Follow-up in 3 month(s) to check in, sooner if concerns  Please go to the lab for blood work.   Our office will call you with your results unless you have chosen to receive results via MyChart.  If your blood work is normal we will follow-up each year for physicals and as scheduled for chronic medical problems.  If anything is abnormal we will treat accordingly and get you in for a follow-up.  Take care,  Lelon Mast

## 2023-04-05 NOTE — Progress Notes (Signed)
Subjective:    [name] White is a 58 y.o. female and is here for a comprehensive physical exam.  HPI  Health Maintenance Due  Topic Date Due   Diabetic kidney evaluation - Urine ACR  Never done   Pneumococcal Vaccine 24-56 Years old (2 of 2 - PPSV23 or PCV20) 03/25/2014   Lung Cancer Screening  11/04/2022   HEMOGLOBIN A1C  12/10/2022    Acute Concerns: None  Chronic Issues: IBS-C and D  Se complains of alternating constipation and diarrhea.  Might experience fecal incontinence on some occasions and as such doesn't take her Linzess on a regular basis.  Recent colonoscopy showed only few polyps and recommended a 3 year recall. Doesn't believe metformin is contributing to her diarrhea.  RLS Reports compliance and good tolerance of  2 mg Requip.    GERD Patient is complaint and reports good tolerance of  protonix 40 mg. No concerns or symptoms at this time.  Obesity/Insulin resistance/ Type 2 Diabetes Reports compliance and good tolerance of metformin 500 mg XR once nightly.  No concerns or symptoms at this time.  Insurance did not cover GLP1 medication, requesting a prescription today for diabetes.   Migraines  Continues to complain of migraines. Was thinking of having Botox to reduce symptoms but did not due to cost concerns.   Chronic Pain She continues to complain of side and back chronic pain. Specialist did not recommend undergoing a back surgery.  Continues to be managed by medications from pain management  Cholesterol  Reports compliance and good tolerance of pravastatin 40 mg daily.  No concerns or symptoms at this time.  Sleep Apnea She denies using her CPAP machine daily as she has to be refitted. Plans to soon.  Health Maintenance: Immunizations -- N/A Colonoscopy -- Last done on 09-27-22. Repeat in 3 years. Mammogram -- last done on 11-26-23. Normal  PAP -- last done on 01-20-10. Bone Density -- N/A Diet -- sodas, energy drinks, poor diet Exercise  -- daily walks   Sleep habits -- no complains Mood -- stable  UTD with dentist? - yes UTD with eye doctor? - yes  Weight history: Wt Readings from Last 10 Encounters:  04/05/23 240 lb (108.9 kg)  01/24/23 238 lb (108 kg)  01/17/23 233 lb 3.2 oz (105.8 kg)  10/14/22 240 lb (108.9 kg)  09/27/22 240 lb (108.9 kg)  09/20/22 243 lb 12.8 oz (110.6 kg)  09/13/22 235 lb (106.6 kg)  06/10/22 236 lb (107 kg)  04/09/22 236 lb 12.8 oz (107.4 kg)  03/18/22 236 lb 9.6 oz (107.3 kg)   Body mass index is 39.94 kg/m. No LMP recorded (lmp unknown). Patient has had a hysterectomy.  Alcohol use:  reports that she does not currently use alcohol after a past usage of about 6.0 standard drinks of alcohol per week.  Tobacco use:  Tobacco Use: High Risk (04/05/2023)   Patient History    Smoking Tobacco Use: Every Day    Smokeless Tobacco Use: Never    Passive Exposure: Not on file   Eligible for lung cancer screening? no     04/05/2023    2:32 PM  Depression screen PHQ 2/9  Decreased Interest 3  Down, Depressed, Hopeless 1  PHQ - 2 Score 4  Altered sleeping 1  Tired, decreased energy 2  Change in appetite 2  Feeling bad or failure about yourself  2  Trouble concentrating 1  Moving slowly or fidgety/restless 1  Suicidal thoughts 0  PHQ-9 Score 13  Difficult doing work/chores Very difficult     Other providers/specialists: Patient Care Team: Jarold Motto, [place] as PCP - General (Physician Assistant) Quintella Reichert, MD as PCP - Cardiology (Cardiology) Hart Carwin, MD (Inactive) as Consulting Physician (Gastroenterology) Freddy Finner, MD (Inactive) as Consulting Physician (Obstetrics and Gynecology) Thyra Breed, MD (Anesthesiology) Samson Frederic, MD as Consulting Physician (Orthopedic Surgery)    PMHx, SurgHx, SocialHx, Medications, and Allergies were reviewed in the Visit Navigator and updated as appropriate.   Past Medical History:  Diagnosis Date   Anterolisthesis     Arthritis    Barrett's esophagus    egd 2007 + 2009 but none 2011 or 08/2010 egd   Bipolar 1 disorder (HCC)    CAD (coronary artery disease) 02/03/2021   a.) cCTA 02/03/2021: Ca score = 1 (72nd percentile for age/sex match control)   Chronic, continuous use of opioids    Colon polyp, hyperplastic    Depression    Diastolic dysfunction 02/06/2021   a.) TTE 02/06/2021: EF 55-60%, mild LVH, G1DD   DOE (dyspnea on exertion)    Family history of adverse reaction to anesthesia    a.) delayed emergence in 1st degree relative (brother)   Gastroparesis    Generalized anxiety disorder with panic attacks    GERD (gastroesophageal reflux disease)    Grief reaction    Hidradenitis suppurativa    History of kidney stones    HLD (hyperlipidemia)    IBS (irritable bowel syndrome)    Migraine    Morbid obesity (HCC)    OSA (obstructive sleep apnea)    a.) did not tolerate nocturnal PAP therapy mainly due to anxiety and associated xerostomia   Ovarian cyst    Palpitations    Plantar fasciitis    PTSD (post-traumatic stress disorder)    Restless leg    a.) on ropinirole   Suicide attempt (HCC) 2003   Tobacco abuse    Type 2 diabetes mellitus treated without insulin (HCC)      Past Surgical History:  Procedure Laterality Date   ABDOMINAL HYSTERECTOMY     secondary to endometriosis   BACK SURGERY Right 2019   lumbar ablation L3-S1 x 2    BIOPSY  05/07/2021   Procedure: BIOPSY;  Surgeon: Lemar Lofty., MD;  Location: [facility] ENDOSCOPY;  Service: Gastroenterology;;   CESAREAN SECTION     x 2   CHOLECYSTECTOMY     COLONOSCOPY W/ POLYPECTOMY  2012   COLONOSCOPY WITH PROPOFOL N/A 05/01/2020   Procedure: COLONOSCOPY WITH PROPOFOL;  Surgeon: Lemar Lofty., MD;  Location: [facility] ENDOSCOPY;  Service: Gastroenterology;  Laterality: N/A;   COLONOSCOPY WITH PROPOFOL N/A 05/07/2021   Procedure: COLONOSCOPY WITH PROPOFOL;  Surgeon: Meridee Score Netty Starring., MD;  Location: [facility] ENDOSCOPY;   Service: Gastroenterology;  Laterality: N/A;   COLONOSCOPY WITH PROPOFOL N/A 09/27/2022   Procedure: COLONOSCOPY WITH PROPOFOL;  Surgeon: Meridee Score Netty Starring., MD;  Location: [facility] ENDOSCOPY;  Service: Gastroenterology;  Laterality: N/A;   ELBOW SURGERY Left    ENDOSCOPIC MUCOSAL RESECTION N/A 05/01/2020   Procedure: ENDOSCOPIC MUCOSAL RESECTION;  Surgeon: Meridee Score Netty Starring., MD;  Location: [facility] ENDOSCOPY;  Service: Gastroenterology;  Laterality: N/A;   ENDOSCOPIC MUCOSAL RESECTION N/A 05/07/2021   Procedure: ENDOSCOPIC MUCOSAL RESECTION;  Surgeon: Meridee Score Netty Starring., MD;  Location: [facility] ENDOSCOPY;  Service: Gastroenterology;  Laterality: N/A;   GANGLION CYST EXCISION Right    HEMOSTASIS CLIP PLACEMENT  05/01/2020   Procedure: HEMOSTASIS CLIP PLACEMENT;  Surgeon: Meridee Score,  Netty Starring., MD;  Location: [facility] ENDOSCOPY;  Service: Gastroenterology;;   HEMOSTASIS CLIP PLACEMENT  05/07/2021   Procedure: HEMOSTASIS CLIP PLACEMENT;  Surgeon: Lemar Lofty., MD;  Location: [facility] ENDOSCOPY;  Service: Gastroenterology;;   HOT HEMOSTASIS N/A 05/07/2021   Procedure: HOT HEMOSTASIS (ARGON PLASMA COAGULATION/BICAP);  Surgeon: Lemar Lofty., MD;  Location: [facility] ENDOSCOPY;  Service: Gastroenterology;  Laterality: N/A;   LAPAROSCOPIC LYSIS OF ADHESIONS  04/22/2010   LAPAROSCOPY  04/30/2010   with mini laparotomy and cystotomy repair   PLANTAR FASCIA SURGERY Right    POLYPECTOMY  05/01/2020   Procedure: POLYPECTOMY;  Surgeon: Mansouraty, Netty Starring., MD;  Location: [facility] ENDOSCOPY;  Service: Gastroenterology;;   POLYPECTOMY  05/07/2021   Procedure: POLYPECTOMY;  Surgeon: Lemar Lofty., MD;  Location: [facility] ENDOSCOPY;  Service: Gastroenterology;;   POLYPECTOMY  09/27/2022   Procedure: POLYPECTOMY;  Surgeon: Lemar Lofty., MD;  Location: [facility] ENDOSCOPY;  Service: Gastroenterology;;   SALPINGECTOMY Right 04/22/2010   SHOULDER ARTHROSCOPY WITH SUBACROMIAL DECOMPRESSION, ROTATOR CUFF  REPAIR AND BICEP TENDON REPAIR Right 03/18/2022   Procedure: SHOULDER ARTHROSCOPY WITH DEBRIDEMENT, DECOMPRESSION, ROTATOR CUFF REPAIR AND BICEPS TENOLYSIS;  Surgeon: Christena Flake, MD;  Location: [facility] ORS;  Service: Orthopedics;  Laterality: Right;   SHOULDER OPEN ROTATOR CUFF REPAIR Right 03/18/2022   Procedure: OPEN ROTATOR CUFF REPAIR;  Surgeon: Christena Flake, MD;  Location: [facility] ORS;  Service: Orthopedics;  Laterality: Right;   SUBMUCOSAL LIFTING INJECTION  05/01/2020   Procedure: SUBMUCOSAL LIFTING INJECTION;  Surgeon: Meridee Score Netty Starring., MD;  Location: [facility] ENDOSCOPY;  Service: Gastroenterology;;   SUBMUCOSAL LIFTING INJECTION  05/07/2021   Procedure: SUBMUCOSAL LIFTING INJECTION;  Surgeon: Lemar Lofty., MD;  Location: [facility] ENDOSCOPY;  Service: Gastroenterology;;   TOTAL HIP ARTHROPLASTY Right 04/25/2017   Procedure: RIGHT TOTAL HIP ARTHROPLASTY ANTERIOR APPROACH;  Surgeon: Samson Frederic, MD;  Location: [facility] OR;  Service: Orthopedics;  Laterality: Right;  Needs RNFA   TUBAL LIGATION       Family History  Problem Relation Age of Onset   COPD Mother    Stroke Mother    Coronary artery disease Mother        pacer   Colon polyps Mother    Breast cancer Mother    Migraines Mother    Other Mother        RSDS   Rheum arthritis Mother    Hypertension Father    Colon polyps Brother    Colon cancer Maternal Grandfather    Colon polyps Maternal Grandfather    Breast cancer Maternal Aunt    Uterine cancer Cousin        x 2   Aneurysm Maternal Grandmother    Kidney disease Brother    Other Brother        back pain   Kidney cancer Paternal Aunt    Breast cancer Cousin    Stomach cancer Neg Hx    Pancreatic cancer Neg Hx    Esophageal cancer Neg Hx    Liver disease Neg Hx    Inflammatory bowel disease Neg Hx     Social History   Tobacco Use   Smoking status: Every Day    Current packs/day: 0.00    Average packs/day: 1 pack/day for 40.4 years (40.4 ttl pk-yrs)     Types: Cigarettes    Start date: 10/16/1980    Last attempt to quit: 2023    Years since quitting: 2.1   Smokeless tobacco: Never   Tobacco comments:  Has smoked off and on since the age of 15, quit while pregnant twice and also quit in 2017 but restarted in 2022  Vaping Use   Vaping status: Never Used  Substance Use Topics   Alcohol use: Not Currently    Alcohol/week: 6.0 standard drinks of alcohol    Types: 6 Standard drinks or equivalent per week    Comment: wine/liquor 1 x a month   Drug use: No    Review of Systems:   Review of Systems  Constitutional:  Negative for chills, fever, malaise/fatigue and weight loss.  HENT:  Negative for hearing loss, sinus pain and sore throat.   Respiratory:  Negative for cough and hemoptysis.   Cardiovascular:  Negative for chest pain, palpitations, leg swelling and PND.  Gastrointestinal:  Positive for heartburn. Negative for abdominal pain, constipation, diarrhea, nausea and vomiting.  Genitourinary:  Negative for dysuria, frequency and urgency.  Musculoskeletal:  Positive for back pain and joint pain. Negative for myalgias and neck pain.  Skin:  Negative for itching and rash.  Neurological:  Positive for tremors. Negative for dizziness, tingling, seizures and headaches.  Endo/Heme/Allergies:  Negative for polydipsia.  Psychiatric/Behavioral:  Negative for depression. The patient is not nervous/anxious.     Objective:   BP 120/60 (BP Location: Left Arm, Patient Position: Sitting, Cuff Size: Large)   Pulse 80   Temp 97.6 F (36.4 C) (Temporal)   Ht 5\' 5"  (1.651 m)   Wt 240 lb (108.9 kg)   LMP  (LMP Unknown)   SpO2 97%   BMI 39.94 kg/m  Body mass index is 39.94 kg/m.   General Appearance:    Alert, cooperative, no distress, appears stated age  Head:    Normocephalic, without obvious abnormality, atraumatic  Eyes:    PERRL, conjunctiva/corneas clear, EOM's intact, fundi    benign, both eyes  Ears:    Normal TM's and external  ear canals, both ears  Nose:   Nares normal, septum midline, mucosa normal, no drainage    or sinus tenderness  Throat:   Lips, mucosa, and tongue normal; teeth and gums normal  Neck:   Supple, symmetrical, trachea midline, no adenopathy;    thyroid:  no enlargement/tenderness/nodules; no carotid   bruit or JVD  Back:     Symmetric, no curvature, ROM normal, no CVA tenderness  Lungs:     Clear to auscultation bilaterally, respirations unlabored  Chest Wall:    No tenderness or deformity   Heart:    Regular rate and rhythm, S1 and S2 normal, no murmur, rub or gallop  Breast Exam:    Deferred   Abdomen:     Soft, non-tender, bowel sounds active all four quadrants,    no masses, no organomegaly  Genitalia:    Deferred  Extremities:   Extremities normal, atraumatic, no cyanosis or edema  Pulses:   2+ and symmetric all extremities  Skin:   Skin color, texture, turgor normal, no rashes or lesions  Lymph nodes:   Cervical, supraclavicular, and axillary nodes normal  Neurologic:   CNII-XII intact, normal strength, sensation and reflexes    throughout    Assessment/Plan:   Routine physical examination Today patient counseled on age appropriate routine health concerns for screening and prevention, each reviewed and up to date or declined. Immunizations reviewed and up to date or declined. Labs ordered and reviewed. Risk factors for depression reviewed and negative. Hearing function and visual acuity are intact. ADLs screened and addressed as needed. Functional ability  and level of safety reviewed and appropriate. Education, counseling and referrals performed based on assessed risks today. Patient provided with a copy of personalized plan for preventive services.  Diabetes mellitus without complication (HCC) Update Hemoglobin A1c Will order Ozempic -- she was diagnosed with diabetes prior to our first visit Continue metformin 500 mg XR daily while awaiting coverage for medication Follow-up in 3  month(s), sooner if concerns  Irritable bowel syndrome with both constipation and diarrhea Reviewed, management per specialist  RLS (restless legs syndrome) Overall stable with Requip 2 mg daily per patient  Gastroesophageal reflux disease, unspecified whether esophagitis present Well controlled with protonix 40 mg daily  Chronic pain Ongoing  Management per specialist  Obesity Continue efforts at healthy lifestyle  Jarold Motto, PA-C [place] [facility] M [facility],acting as a Neurosurgeon for [employer], PA.,have documented all relevant documentation on the behalf of Jarold Motto, PA,as directed by  Jarold Motto, PA while in the presence of Jarold Motto, [place].   I, Jarold Motto, [place], have reviewed all documentation for this visit. The documentation on 04/05/23 for the exam, diagnosis, procedures, and orders are all accurate and complete.

## 2023-04-06 LAB — LIPID PANEL
Cholesterol: 162 mg/dL (ref 0–200)
HDL: 51.4 mg/dL (ref >39.00)
LDL Cholesterol: 82 mg/dL (ref 0–99)
NonHDL: 110.86
Total CHOL/HDL Ratio: 3
Triglycerides: 142 mg/dL (ref 0.0–149.0)
VLDL: 28.4 mg/dL (ref 0.0–40.0)

## 2023-04-06 LAB — COMPREHENSIVE METABOLIC PANEL
ALT: 20 U/L (ref 0–35)
AST: 18 U/L (ref 0–37)
Albumin: 4 g/dL (ref 3.5–5.2)
Alkaline Phosphatase: 63 U/L (ref 39–117)
BUN: 13 mg/dL (ref 6–23)
CO2: 32 meq/L (ref 19–32)
Calcium: 9 mg/dL (ref 8.4–10.5)
Chloride: 101 meq/L (ref 96–112)
Creatinine, Ser: 0.79 mg/dL (ref 0.40–1.20)
GFR: 82.43 mL/min (ref >60.00)
Glucose, Bld: 80 mg/dL (ref 70–99)
Potassium: 3.7 meq/L (ref 3.5–5.1)
Sodium: 137 meq/L (ref 135–145)
Total Bilirubin: 0.3 mg/dL (ref 0.2–1.2)
Total Protein: 7 g/dL (ref 6.0–8.3)

## 2023-04-06 LAB — CBC WITH DIFFERENTIAL/PLATELET
Basophils Absolute: 0.1 10*3/uL (ref 0.0–0.1)
Basophils Relative: 0.9 % (ref 0.0–3.0)
Eosinophils Absolute: 0.2 10*3/uL (ref 0.0–0.7)
Eosinophils Relative: 3.4 % (ref 0.0–5.0)
HCT: 39.4 % (ref 36.0–46.0)
Hemoglobin: 13 g/dL (ref 12.0–15.0)
Lymphocytes Relative: 46.2 % — ABNORMAL HIGH (ref 12.0–46.0)
Lymphs Abs: 3.4 10*3/uL (ref 0.7–4.0)
MCHC: 33.1 g/dL (ref 30.0–36.0)
MCV: 100.7 fL — ABNORMAL HIGH (ref 78.0–100.0)
Monocytes Absolute: 0.8 10*3/uL (ref 0.1–1.0)
Monocytes Relative: 11.5 % (ref 3.0–12.0)
Neutro Abs: 2.8 10*3/uL (ref 1.4–7.7)
Neutrophils Relative %: 38 % — ABNORMAL LOW (ref 43.0–77.0)
Platelets: 220 10*3/uL (ref 150.0–400.0)
RBC: 3.91 Mil/uL (ref 3.87–5.11)
RDW: 13.3 % (ref 11.5–15.5)
WBC: 7.3 10*3/uL (ref 4.0–10.5)

## 2023-04-06 LAB — HEMOGLOBIN A1C: Hgb A1c MFr Bld: 6.1 % (ref 4.6–6.5)

## 2023-04-07 ENCOUNTER — Encounter: Payer: Self-pay | Admitting: Physician Assistant

## 2023-04-07 LAB — MICROALBUMIN / CREATININE URINE RATIO
Creatinine,U: 96 mg/dL
Microalb Creat Ratio: 7.3 mg/g (ref 0.0–30.0)
Microalb, Ur: <0.7 mg/dL (ref 0.0–1.9)

## 2023-04-08 ENCOUNTER — Encounter: Payer: Self-pay | Admitting: Physician Assistant

## 2023-04-10 ENCOUNTER — Other Ambulatory Visit (HOSPITAL_COMMUNITY): Payer: Self-pay | Admitting: Psychiatry

## 2023-04-10 DIAGNOSIS — F319 Bipolar disorder, unspecified: Secondary | ICD-10-CM

## 2023-04-11 ENCOUNTER — Other Ambulatory Visit (HOSPITAL_COMMUNITY): Payer: Self-pay

## 2023-04-11 ENCOUNTER — Telehealth: Payer: Self-pay

## 2023-04-11 NOTE — Telephone Encounter (Signed)
Please resubmit PA for Ozempic again due to patient is Diabetic, Dx: E11.9. Also send A1c results, pt has been on Metformin.

## 2023-04-11 NOTE — Telephone Encounter (Signed)
Pharmacy Patient Advocate Encounter   Received notification from  [facility] Portal that prior authorization for Ozempic (0.25 or 0.5 MG/DOSE) 2MG /3ML pen-injectors is required/requested.   Insurance verification completed.   The patient is insured through [facility] ADVANTAGE/RX ADVANCE .   Per test claim: PA required; PA submitted to above mentioned insurance via CoverMyMeds Key/confirmation #/EOC [id number] Status is pending

## 2023-04-11 NOTE — Telephone Encounter (Signed)
Pharmacy Patient Advocate Encounter  Received notification from [facility] ADVANTAGE/RX ADVANCE that Prior Authorization for Ozempic (0.25 or 0.5 MG/DOSE) 2MG /3ML pen-injectors  has been DENIED.  Full denial letter will be uploaded to the media tab. See denial reason below.   PA #/[place] ID/Reference #: [id number]

## 2023-04-12 ENCOUNTER — Telehealth: Payer: Self-pay | Admitting: Pharmacist

## 2023-04-12 NOTE — Telephone Encounter (Signed)
Appeal has been submitted for Ozempic. Will advise when response is received, please be advised that most companies may take 30 days to make a decision. Appeal letter and supporting documentation were faxed to [phone] on 04/12/2023 @5 :14 pm.  Thank you, Dellie Burns, PharmD Clinical Pharmacist  [place]  Direct Dial: [phone]

## 2023-04-13 ENCOUNTER — Encounter: Payer: Self-pay | Admitting: Physician Assistant

## 2023-04-14 ENCOUNTER — Encounter (HOSPITAL_COMMUNITY): Payer: Self-pay | Admitting: Psychiatry

## 2023-04-14 ENCOUNTER — Telehealth (HOSPITAL_COMMUNITY): Payer: PPO | Admitting: Psychiatry

## 2023-04-14 VITALS — Wt 240.0 lb

## 2023-04-14 DIAGNOSIS — F419 Anxiety disorder, unspecified: Secondary | ICD-10-CM | POA: Diagnosis not present

## 2023-04-14 DIAGNOSIS — F4310 Post-traumatic stress disorder, unspecified: Secondary | ICD-10-CM

## 2023-04-14 DIAGNOSIS — F319 Bipolar disorder, unspecified: Secondary | ICD-10-CM

## 2023-04-14 MED ORDER — DULOXETINE HCL 60 MG PO CPEP: 60.0000 mg | ORAL_CAPSULE | Freq: Every day | ORAL | 2 refills | Status: DC

## 2023-04-14 MED ORDER — RISPERIDONE 1 MG PO TABS: 1.0000 mg | ORAL_TABLET | Freq: Every day | ORAL | 2 refills | Status: DC

## 2023-04-14 MED ORDER — LAMOTRIGINE 200 MG PO TABS
200.0000 mg | ORAL_TABLET | Freq: Every day | ORAL | 2 refills | Status: DC
Start: 2023-04-14 — End: 2023-07-14

## 2023-04-14 MED ORDER — TRAZODONE HCL 150 MG PO TABS: 150.0000 mg | ORAL_TABLET | Freq: Every day | ORAL | 2 refills | Status: DC
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
## 2023-04-19 ENCOUNTER — Ambulatory Visit: Payer: PPO | Admitting: Internal Medicine

## 2023-04-22 ENCOUNTER — Other Ambulatory Visit: Payer: Self-pay | Admitting: Physician Assistant

## 2023-04-26 DIAGNOSIS — G894 Chronic pain syndrome: Secondary | ICD-10-CM | POA: Diagnosis not present

## 2023-04-26 DIAGNOSIS — Z79891 Long term (current) use of opiate analgesic: Secondary | ICD-10-CM | POA: Diagnosis not present

## 2023-04-27 ENCOUNTER — Other Ambulatory Visit (HOSPITAL_COMMUNITY): Payer: Self-pay

## 2023-04-27 ENCOUNTER — Other Ambulatory Visit: Payer: Self-pay

## 2023-04-27 DIAGNOSIS — M7061 Trochanteric bursitis, right hip: Secondary | ICD-10-CM | POA: Diagnosis not present

## 2023-04-27 DIAGNOSIS — M47816 Spondylosis without myelopathy or radiculopathy, lumbar region: Secondary | ICD-10-CM | POA: Diagnosis not present

## 2023-04-27 DIAGNOSIS — Z79891 Long term (current) use of opiate analgesic: Secondary | ICD-10-CM | POA: Diagnosis not present

## 2023-04-27 DIAGNOSIS — G894 Chronic pain syndrome: Secondary | ICD-10-CM | POA: Diagnosis not present

## 2023-04-27 DIAGNOSIS — M5416 Radiculopathy, lumbar region: Secondary | ICD-10-CM | POA: Diagnosis not present

## 2023-04-27 MED ORDER — HYDROMORPHONE HCL 2 MG PO TABS
2.0000 mg | ORAL_TABLET | Freq: Every day | ORAL | 0 refills | Status: DC
  Filled 2023-04-27: qty 150, 30d supply, fill #0

## 2023-04-28 ENCOUNTER — Encounter: Payer: Self-pay | Admitting: Physician Assistant

## 2023-04-28 ENCOUNTER — Other Ambulatory Visit (HOSPITAL_COMMUNITY): Payer: Self-pay

## 2023-04-28 NOTE — Telephone Encounter (Signed)
 Per [employer], the appeal for Ozempic has been approved.  Approval dates are 04/13/2023-04/12/2024.    Thank you, Dellie Burns, PharmD Clinical Pharmacist  [facility]  Direct Dial: [phone]

## 2023-04-28 NOTE — Telephone Encounter (Signed)
 Hello, any word on appeal for Ozempic?

## 2023-04-29 ENCOUNTER — Telehealth: Payer: Self-pay | Admitting: Internal Medicine

## 2023-04-29 DIAGNOSIS — G4733 Obstructive sleep apnea (adult) (pediatric): Secondary | ICD-10-CM

## 2023-04-29 NOTE — Telephone Encounter (Signed)
 Patient needs an updated referral to be sent to the sleep center for her mask fitting. They told her that the other referral was outdated.

## 2023-04-29 NOTE — Telephone Encounter (Signed)
 I called and spoke to pt. Pt states she let the previous order run out, and she needs a new order sent to the sleep center for the mask fitting. Please advise. Pt was last seen on 01-17-23 by Dr Maple Hudson.

## 2023-04-30 DIAGNOSIS — M47816 Spondylosis without myelopathy or radiculopathy, lumbar region: Secondary | ICD-10-CM | POA: Diagnosis not present

## 2023-05-02 ENCOUNTER — Other Ambulatory Visit (HOSPITAL_COMMUNITY): Payer: Self-pay

## 2023-05-02 NOTE — Telephone Encounter (Signed)
 Order- referral for mask fitting at sleep center. (Note- that is now at [facility] due to construction)

## 2023-05-02 NOTE — Telephone Encounter (Signed)
 Called patient.  Gave information that we have send order in for referral to sleep center (now at [facility]).  Patient verbalized understanding and will await call for appointment.  Order sent.

## 2023-05-03 ENCOUNTER — Other Ambulatory Visit (HOSPITAL_COMMUNITY): Payer: Self-pay

## 2023-05-03 NOTE — Telephone Encounter (Signed)
 Left message on voicemail to call office. Please tell pt Ozempic has been approved for one year.

## 2023-05-04 NOTE — Telephone Encounter (Signed)
 Spoke to pt told her Ozempic has been approved for one year. Pt verbalized understanding and said someone spoke to her yesterday. Told her okay, sorry wanted to make sure you knew.

## 2023-05-06 ENCOUNTER — Other Ambulatory Visit (HOSPITAL_COMMUNITY): Payer: Self-pay

## 2023-05-14 ENCOUNTER — Other Ambulatory Visit: Payer: Self-pay | Admitting: Physician Assistant

## 2023-05-16 ENCOUNTER — Ambulatory Visit (HOSPITAL_BASED_OUTPATIENT_CLINIC_OR_DEPARTMENT_OTHER): Attending: Internal Medicine | Admitting: Radiology

## 2023-05-16 DIAGNOSIS — G4733 Obstructive sleep apnea (adult) (pediatric): Secondary | ICD-10-CM

## 2023-05-30 ENCOUNTER — Other Ambulatory Visit (HOSPITAL_COMMUNITY): Payer: Self-pay

## 2023-05-30 DIAGNOSIS — M5416 Radiculopathy, lumbar region: Secondary | ICD-10-CM | POA: Diagnosis not present

## 2023-05-30 DIAGNOSIS — M7061 Trochanteric bursitis, right hip: Secondary | ICD-10-CM | POA: Diagnosis not present

## 2023-05-30 DIAGNOSIS — M47816 Spondylosis without myelopathy or radiculopathy, lumbar region: Secondary | ICD-10-CM | POA: Diagnosis not present

## 2023-05-30 DIAGNOSIS — G894 Chronic pain syndrome: Secondary | ICD-10-CM | POA: Diagnosis not present

## 2023-05-30 MED ORDER — HYDROMORPHONE HCL 2 MG PO TABS
2.0000 mg | ORAL_TABLET | Freq: Every day | ORAL | 0 refills | Status: DC | PRN
  Filled 2023-05-30: qty 150, 30d supply, fill #0

## 2023-05-31 DIAGNOSIS — M47816 Spondylosis without myelopathy or radiculopathy, lumbar region: Secondary | ICD-10-CM | POA: Diagnosis not present

## 2023-06-17 ENCOUNTER — Telehealth: Payer: Self-pay

## 2023-06-17 DIAGNOSIS — G4733 Obstructive sleep apnea (adult) (pediatric): Secondary | ICD-10-CM

## 2023-06-17 NOTE — Telephone Encounter (Signed)
 Copied from CRM [id number]. Topic: General - Other >> Jun 17, 2023  8:28 AM Hilton Lucky wrote: Reason for CRM: Patient calling in to state that her dog has chewed up her new CPAP mask and she needs another order placed so that she can get another.  Placing order for CPAP supplies for [employer].  Patient got first mask during mask fitting at sleep center.  Per Dr. Linder [name], they will not give her a second mask for free.  Patient verbalized understanding.

## 2023-06-27 ENCOUNTER — Other Ambulatory Visit (HOSPITAL_COMMUNITY): Payer: Self-pay

## 2023-06-27 DIAGNOSIS — M5416 Radiculopathy, lumbar region: Secondary | ICD-10-CM | POA: Diagnosis not present

## 2023-06-27 DIAGNOSIS — M7061 Trochanteric bursitis, right hip: Secondary | ICD-10-CM | POA: Diagnosis not present

## 2023-06-27 DIAGNOSIS — M47816 Spondylosis without myelopathy or radiculopathy, lumbar region: Secondary | ICD-10-CM | POA: Diagnosis not present

## 2023-06-27 DIAGNOSIS — G894 Chronic pain syndrome: Secondary | ICD-10-CM | POA: Diagnosis not present

## 2023-06-27 MED ORDER — HYDROMORPHONE HCL 2 MG PO TABS
2.0000 mg | ORAL_TABLET | Freq: Every day | ORAL | 0 refills | Status: DC
  Filled 2023-06-27: qty 150, 30d supply, fill #0

## 2023-06-29 ENCOUNTER — Other Ambulatory Visit: Payer: Self-pay | Admitting: Physician Assistant

## 2023-06-30 DIAGNOSIS — M47816 Spondylosis without myelopathy or radiculopathy, lumbar region: Secondary | ICD-10-CM | POA: Diagnosis not present

## 2023-07-04 ENCOUNTER — Ambulatory Visit: Payer: PPO | Admitting: Physician Assistant

## 2023-07-11 ENCOUNTER — Ambulatory Visit: Admitting: Physician Assistant

## 2023-07-11 VITALS — BP 130/80 | HR 81 | Temp 98.1°F | Ht 65.0 in | Wt 235.4 lb

## 2023-07-11 DIAGNOSIS — Z7984 Long term (current) use of oral hypoglycemic drugs: Secondary | ICD-10-CM

## 2023-07-11 DIAGNOSIS — E119 Type 2 diabetes mellitus without complications: Secondary | ICD-10-CM | POA: Diagnosis not present

## 2023-07-11 DIAGNOSIS — K219 Gastro-esophageal reflux disease without esophagitis: Secondary | ICD-10-CM | POA: Diagnosis not present

## 2023-07-11 NOTE — Patient Instructions (Signed)
 It was great to see you!  Stop meloxicam  Start full dose of 0.25 mg of Ozempic   After 4 weeks of this, you can increase to 0.5 mg if you are comfortable doing so  Let's follow-up in 3 months, sooner if you have concerns.  Take care,  Alexander Iba PA-C

## 2023-07-11 NOTE — Progress Notes (Signed)
 [name] White is a 58 y.o. female here for a follow up of a pre-existing problem.  History of Present Illness:   Chief Complaint  Patient presents with   Diabetes    HPI  Diabetes Pt is on Ozempic  injection once weekly.  Compliant, but during our office visit today, it has become apparent that she is administering a microdose instead of 0.25 mg. She reports reduced appetite. She states she felt like she lost weight based on her scale at home, but has not lost weight based on scale in office.  GERD Pt is on Protonix  40 mg once daily. She reports feeling episodes of GERD recently. Last night, she reports experiencing the "same feeling she has always had." She reports eating tater tots last night and states she usually airfries her food. She usually complies with the one daily Protonix  tablet, but has felt the need to take a second tablet about 2 times a month. Endorses Meloxicam once nightly. Denies any medication changes.  Past Medical History:  Diagnosis Date   Anterolisthesis    Arthritis    Barrett's esophagus    egd 2007 + 2009 but none 2011 or 08/2010 egd   Bipolar 1 disorder (HCC)    CAD (coronary artery disease) 02/03/2021   a.) cCTA 02/03/2021: Ca score = 1 (72nd percentile for age/sex match control)   Chronic, continuous use of opioids    Colon polyp, hyperplastic    Depression    Diastolic dysfunction 02/06/2021   a.) TTE 02/06/2021: EF 55-60%, mild LVH, G1DD   DOE (dyspnea on exertion)    Family history of adverse reaction to anesthesia    a.) delayed emergence in 1st degree relative (brother)   Gastroparesis    Generalized anxiety disorder with panic attacks    GERD (gastroesophageal reflux disease)    Grief reaction    Hidradenitis suppurativa    History of kidney stones    HLD (hyperlipidemia)    IBS (irritable bowel syndrome)    Migraine    Morbid obesity (HCC)    OSA (obstructive sleep apnea)    a.) did not tolerate nocturnal PAP therapy mainly due  to anxiety and associated xerostomia   Ovarian cyst    Palpitations    Plantar fasciitis    PTSD (post-traumatic stress disorder)    Restless leg    a.) on ropinirole    Suicide attempt (HCC) 2003   Tobacco abuse    Type 2 diabetes mellitus treated without insulin (HCC)      Social History   Tobacco Use   Smoking status: Every Day    Current packs/day: 0.00    Average packs/day: 1 pack/day for 40.4 years (40.4 ttl pk-yrs)    Types: Cigarettes    Start date: 10/16/1980    Last attempt to quit: 2023    Years since quitting: 2.3   Smokeless tobacco: Never   Tobacco comments:    Has smoked off and on since the age of 10, quit while pregnant twice and also quit in 2017 but restarted in 2022  Vaping Use   Vaping status: Never Used  Substance Use Topics   Alcohol  use: Not Currently    Alcohol /week: 6.0 standard drinks of alcohol     Types: 6 Standard drinks or equivalent per week    Comment: wine/liquor 1 x a month   Drug use: No    Past Surgical History:  Procedure Laterality Date   ABDOMINAL HYSTERECTOMY     secondary to endometriosis  BACK SURGERY Right 2019   lumbar ablation L3-S1 x 2    BIOPSY  05/07/2021   Procedure: BIOPSY;  Surgeon: Brice Campi Albino Alu., MD;  Location: [facility] ENDOSCOPY;  Service: Gastroenterology;;   CESAREAN SECTION     x 2   CHOLECYSTECTOMY     COLONOSCOPY W/ POLYPECTOMY  2012   COLONOSCOPY WITH PROPOFOL  N/A 05/01/2020   Procedure: COLONOSCOPY WITH PROPOFOL ;  Surgeon: Normie Becton., MD;  Location: [facility] ENDOSCOPY;  Service: Gastroenterology;  Laterality: N/A;   COLONOSCOPY WITH PROPOFOL  N/A 05/07/2021   Procedure: COLONOSCOPY WITH PROPOFOL ;  Surgeon: Brice Campi Albino Alu., MD;  Location: [facility] ENDOSCOPY;  Service: Gastroenterology;  Laterality: N/A;   COLONOSCOPY WITH PROPOFOL  N/A 09/27/2022   Procedure: COLONOSCOPY WITH PROPOFOL ;  Surgeon: Brice Campi Albino Alu., MD;  Location: [facility] ENDOSCOPY;  Service: Gastroenterology;  Laterality: N/A;    ELBOW SURGERY Left    ENDOSCOPIC MUCOSAL RESECTION N/A 05/01/2020   Procedure: ENDOSCOPIC MUCOSAL RESECTION;  Surgeon: Brice Campi Albino Alu., MD;  Location: [facility] ENDOSCOPY;  Service: Gastroenterology;  Laterality: N/A;   ENDOSCOPIC MUCOSAL RESECTION N/A 05/07/2021   Procedure: ENDOSCOPIC MUCOSAL RESECTION;  Surgeon: Brice Campi Albino Alu., MD;  Location: [facility] ENDOSCOPY;  Service: Gastroenterology;  Laterality: N/A;   GANGLION CYST EXCISION Right    HEMOSTASIS CLIP PLACEMENT  05/01/2020   Procedure: HEMOSTASIS CLIP PLACEMENT;  Surgeon: Normie Becton., MD;  Location: [facility] ENDOSCOPY;  Service: Gastroenterology;;   HEMOSTASIS CLIP PLACEMENT  05/07/2021   Procedure: HEMOSTASIS CLIP PLACEMENT;  Surgeon: Normie Becton., MD;  Location: [facility] ENDOSCOPY;  Service: Gastroenterology;;   HOT HEMOSTASIS N/A 05/07/2021   Procedure: HOT HEMOSTASIS (ARGON PLASMA COAGULATION/BICAP);  Surgeon: Normie Becton., MD;  Location: [facility] ENDOSCOPY;  Service: Gastroenterology;  Laterality: N/A;   LAPAROSCOPIC LYSIS OF ADHESIONS  04/22/2010   LAPAROSCOPY  04/30/2010   with mini laparotomy and cystotomy repair   PLANTAR FASCIA SURGERY Right    POLYPECTOMY  05/01/2020   Procedure: POLYPECTOMY;  Surgeon: Mansouraty, Albino Alu., MD;  Location: [facility] ENDOSCOPY;  Service: Gastroenterology;;   POLYPECTOMY  05/07/2021   Procedure: POLYPECTOMY;  Surgeon: Normie Becton., MD;  Location: [facility] ENDOSCOPY;  Service: Gastroenterology;;   POLYPECTOMY  09/27/2022   Procedure: POLYPECTOMY;  Surgeon: Normie Becton., MD;  Location: [facility] ENDOSCOPY;  Service: Gastroenterology;;   SALPINGECTOMY Right 04/22/2010   SHOULDER ARTHROSCOPY WITH SUBACROMIAL DECOMPRESSION, ROTATOR CUFF REPAIR AND BICEP TENDON REPAIR Right 03/18/2022   Procedure: SHOULDER ARTHROSCOPY WITH DEBRIDEMENT, DECOMPRESSION, ROTATOR CUFF REPAIR AND BICEPS TENOLYSIS;  Surgeon: Elner Hahn, MD;  Location: [facility] ORS;  Service: Orthopedics;  Laterality:  Right;   SHOULDER OPEN ROTATOR CUFF REPAIR Right 03/18/2022   Procedure: OPEN ROTATOR CUFF REPAIR;  Surgeon: Elner Hahn, MD;  Location: [facility] ORS;  Service: Orthopedics;  Laterality: Right;   SUBMUCOSAL LIFTING INJECTION  05/01/2020   Procedure: SUBMUCOSAL LIFTING INJECTION;  Surgeon: Brice Campi Albino Alu., MD;  Location: [facility] ENDOSCOPY;  Service: Gastroenterology;;   SUBMUCOSAL LIFTING INJECTION  05/07/2021   Procedure: SUBMUCOSAL LIFTING INJECTION;  Surgeon: Normie Becton., MD;  Location: [facility] ENDOSCOPY;  Service: Gastroenterology;;   TOTAL HIP ARTHROPLASTY Right 04/25/2017   Procedure: RIGHT TOTAL HIP ARTHROPLASTY ANTERIOR APPROACH;  Surgeon: Adonica Hoose, MD;  Location: [facility] OR;  Service: Orthopedics;  Laterality: Right;  Needs RNFA   TUBAL LIGATION      Family History  Problem Relation Age of Onset   COPD Mother    Stroke Mother    Coronary artery disease Mother  pacer   Colon polyps Mother    Breast cancer Mother    Migraines Mother    Other Mother        RSDS   Rheum arthritis Mother    Hypertension Father    Colon polyps Brother    Colon cancer Maternal Grandfather    Colon polyps Maternal Grandfather    Breast cancer Maternal Aunt    Uterine cancer Cousin        x 2   Aneurysm Maternal Grandmother    Kidney disease Brother    Other Brother        back pain   Kidney cancer Paternal Aunt    Breast cancer Cousin    Stomach cancer Neg Hx    Pancreatic cancer Neg Hx    Esophageal cancer Neg Hx    Liver disease Neg Hx    Inflammatory bowel disease Neg Hx     Allergies  Allergen Reactions   Morphine Hives and Nausea And Vomiting    INTOLERANCE >  N & V   Penicillins Hives, Itching and Other (See Comments)     PATIENT HAS HAD A PCN REACTION WITH IMMEDIATE RASH, FACIAL/TONGUE/THROAT SWELLING, SOB, OR LIGHTHEADEDNESS WITH HYPOTENSION:  #  #  #  YES  #  #  #   Has patient had a PCN reaction causing severe rash involving mucus membranes or skin necrosis:  Unknown Has patient had a PCN reaction that required hospitalization: No Has patient had a PCN reaction occurring within the last 10 years: No If all of the above answers are "NO", then may proceed with Cephalosporin use.    Clarithromycin  Nausea And Vomiting    Current Medications:   Current Outpatient Medications:    acetaminophen  (TYLENOL ) 500 MG tablet, Take 2,000 mg by mouth daily as needed for moderate pain., Disp: , Rfl:    DULoxetine  (CYMBALTA ) 60 MG capsule, Take 1 capsule (60 mg total) by mouth at bedtime., Disp: 30 capsule, Rfl: 2   furosemide  (LASIX ) 20 MG tablet, TAKE 1 TABLET BY MOUTH DAILY AS NEEDED FOR FLUID., Disp: 90 tablet, Rfl: 1   HYDROmorphone  (DILAUDID ) 2 MG tablet, Take 1 tablet (2 mg total) by mouth 5 (five) times daily as needed for pain., Disp: 150 tablet, Rfl: 0   HYDROmorphone  (DILAUDID ) 2 MG tablet, Take 1 tablet (2 mg total) by mouth 5 (five) times daily as needed for pain, Disp: 150 tablet, Rfl: 0   HYDROmorphone  (DILAUDID ) 2 MG tablet, Take 1 tablet (2 mg total) by mouth 5 (five) times daily as needed for pain, Disp: 150 tablet, Rfl: 0   lamoTRIgine  (LAMICTAL ) 200 MG tablet, Take 1 tablet (200 mg total) by mouth at bedtime., Disp: 30 tablet, Rfl: 2   linaclotide  (LINZESS ) 145 MCG CAPS capsule, Take 1 capsule (145 mcg total) by mouth daily before breakfast., Disp: 30 capsule, Rfl: 6   meloxicam (MOBIC) 7.5 MG tablet, Take 7.5 mg by mouth 2 (two) times daily., Disp: , Rfl:    metFORMIN  (GLUCOPHAGE -XR) 500 MG 24 hr tablet, TAKE 1 TABLET BY MOUTH 2 TIMES DAILY WITH A MEAL., Disp: 180 tablet, Rfl: 1   nitroGLYCERIN  (NITROSTAT ) 0.4 MG SL tablet, Place 1 tablet (0.4 mg total) under the tongue every 5 (five) minutes as needed for chest pain., Disp: 50 tablet, Rfl: 3   pantoprazole  (PROTONIX ) 40 MG tablet, TAKE 1 TABLET BY MOUTH EVERY DAY, Disp: 90 tablet, Rfl: 1   pravastatin  (PRAVACHOL ) 40 MG tablet, TAKE 1 TABLET BY MOUTH  EVERY DAY, Disp: 90 tablet, Rfl: 1    risperiDONE  (RISPERDAL ) 1 MG tablet, Take 1 tablet (1 mg total) by mouth at bedtime., Disp: 30 tablet, Rfl: 2   rizatriptan  (MAXALT -MLT) 10 MG disintegrating tablet, Take 1 tablet (10 mg total) by mouth as needed for migraine. May repeat in 2 hours if needed.  Maximum 2 tablets in 24 hours, Disp: 12 tablet, Rfl: 11   rOPINIRole  (REQUIP ) 1 MG tablet, TAKE 2 TABLETS BY MOUTH AT BEDTIME, Disp: 180 tablet, Rfl: 1   Semaglutide ,0.25 or 0.5MG /DOS, (OZEMPIC , 0.25 OR 0.5 MG/DOSE,) 2 MG/3ML SOPN, INJECT 0.25MG  INTO THE SKIN ONE TIME PER WEEK, Disp: 3 mL, Rfl: 1   topiramate  (TOPAMAX ) 200 MG tablet, TAKE 1 TABLET BY MOUTH EVERYDAY AT BEDTIME, Disp: 90 tablet, Rfl: 3   traZODone  (DESYREL ) 150 MG tablet, Take 1 tablet (150 mg total) by mouth at bedtime., Disp: 30 tablet, Rfl: 2   Review of Systems:   Negative unless otherwise specified per HPI.  Vitals:   Vitals:   07/11/23 1335  BP: 130/80  Pulse: 81  Temp: 98.1 F (36.7 C)  TempSrc: Temporal  SpO2: 97%  Weight: 235 lb 6.1 oz (106.8 kg)  Height: 5\' 5"  (1.651 m)     Body mass index is 39.17 kg/m.  Physical Exam:   Physical Exam Vitals and nursing note reviewed.  Constitutional:      General: She is not in acute distress.    Appearance: She is well-developed. She is not ill-appearing or toxic-appearing.  Cardiovascular:     Rate and Rhythm: Normal rate and regular rhythm.     Pulses: Normal pulses.     Heart sounds: Normal heart sounds, S1 normal and S2 normal.  Pulmonary:     Effort: Pulmonary effort is normal.     Breath sounds: Normal breath sounds.  Skin:    General: Skin is warm and dry.  Neurological:     Mental Status: She is alert.     GCS: GCS eye subscore is 4. GCS verbal subscore is 5. GCS motor subscore is 6.  Psychiatric:        Speech: Speech normal.        Behavior: Behavior normal. Behavior is cooperative.     Assessment and Plan:   1. Diabetes mellitus without complication (HCC) (Primary) Doing fair with  Ozempic  but unfortunately during our visit we realized she is not administering medication correctly Discussed need to start 0.25 mg weekly dosage Follow up in 3 month(s), sooner if concerns Cautioned her on gastrointestinal side effect(s)   2. Gastroesophageal reflux disease, unspecified whether esophagitis present Per patient, this has improved with increase of pantoprazole  to twice daily on "bad days" Cautioned her that this may worsen with further Ozempic  increase Recommend stopping mobic since it is not helping her shoulder pain If symptom(s) worsen, will get her back in with gastroenterology   Denies concerns for cardiac etiology, doubt acute coronary syndrome -- discussed if worsening symptom(s), reach out  I, Timoteo Force, acting as a Neurosurgeon for [employer], [place]., have documented all relevant documentation on the behalf of Alexander Iba, [place], as directed by  Alexander Iba, PA while in the presence of Alexander Iba, [place].  I, Alexander Iba, [place], have reviewed all documentation for this visit. The documentation on 07/11/23 for the exam, diagnosis, procedures, and orders are all accurate and complete.  Alexander Iba, PA-C

## 2023-07-14 ENCOUNTER — Encounter (HOSPITAL_COMMUNITY): Payer: Self-pay | Admitting: Psychiatry

## 2023-07-14 ENCOUNTER — Telehealth (HOSPITAL_BASED_OUTPATIENT_CLINIC_OR_DEPARTMENT_OTHER): Payer: PPO | Admitting: Psychiatry

## 2023-07-14 DIAGNOSIS — F4310 Post-traumatic stress disorder, unspecified: Secondary | ICD-10-CM | POA: Diagnosis not present

## 2023-07-14 DIAGNOSIS — F319 Bipolar disorder, unspecified: Secondary | ICD-10-CM | POA: Diagnosis not present

## 2023-07-14 DIAGNOSIS — F419 Anxiety disorder, unspecified: Secondary | ICD-10-CM | POA: Diagnosis not present

## 2023-07-14 MED ORDER — RISPERIDONE 1 MG PO TABS
1.0000 mg | ORAL_TABLET | Freq: Every day | ORAL | 2 refills | Status: DC
Start: 2023-07-14 — End: 2023-10-14

## 2023-07-14 MED ORDER — TRAZODONE HCL 150 MG PO TABS: 150.0000 mg | ORAL_TABLET | Freq: Every day | ORAL | 2 refills | Status: DC

## 2023-07-14 MED ORDER — LAMOTRIGINE 200 MG PO TABS
200.0000 mg | ORAL_TABLET | Freq: Every day | ORAL | 2 refills | Status: DC
Start: 2023-07-14 — End: 2023-10-14

## 2023-07-14 MED ORDER — DULOXETINE HCL 60 MG PO CPEP: 60.0000 mg | ORAL_CAPSULE | Freq: Every day | ORAL | 2 refills | Status: DC
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
## 2023-07-15 ENCOUNTER — Other Ambulatory Visit: Payer: Self-pay | Admitting: Physician Assistant

## 2023-07-25 ENCOUNTER — Other Ambulatory Visit (HOSPITAL_COMMUNITY): Payer: Self-pay

## 2023-07-25 DIAGNOSIS — G894 Chronic pain syndrome: Secondary | ICD-10-CM | POA: Diagnosis not present

## 2023-07-25 DIAGNOSIS — M47816 Spondylosis without myelopathy or radiculopathy, lumbar region: Secondary | ICD-10-CM | POA: Diagnosis not present

## 2023-07-25 DIAGNOSIS — M5416 Radiculopathy, lumbar region: Secondary | ICD-10-CM | POA: Diagnosis not present

## 2023-07-25 DIAGNOSIS — M7061 Trochanteric bursitis, right hip: Secondary | ICD-10-CM | POA: Diagnosis not present

## 2023-07-25 MED ORDER — HYDROMORPHONE HCL 2 MG PO TABS
2.0000 mg | ORAL_TABLET | Freq: Every day | ORAL | 0 refills | Status: DC
  Filled 2023-07-25: qty 150, 30d supply, fill #0

## 2023-07-31 DIAGNOSIS — M47816 Spondylosis without myelopathy or radiculopathy, lumbar region: Secondary | ICD-10-CM | POA: Diagnosis not present

## 2023-08-12 ENCOUNTER — Other Ambulatory Visit: Payer: Self-pay | Admitting: Physician Assistant

## 2023-08-14 ENCOUNTER — Other Ambulatory Visit: Payer: Self-pay | Admitting: Physician Assistant

## 2023-08-22 ENCOUNTER — Other Ambulatory Visit (HOSPITAL_COMMUNITY): Payer: Self-pay

## 2023-08-22 DIAGNOSIS — M5416 Radiculopathy, lumbar region: Secondary | ICD-10-CM | POA: Diagnosis not present

## 2023-08-22 DIAGNOSIS — M7061 Trochanteric bursitis, right hip: Secondary | ICD-10-CM | POA: Diagnosis not present

## 2023-08-22 DIAGNOSIS — G894 Chronic pain syndrome: Secondary | ICD-10-CM | POA: Diagnosis not present

## 2023-08-22 DIAGNOSIS — M47816 Spondylosis without myelopathy or radiculopathy, lumbar region: Secondary | ICD-10-CM | POA: Diagnosis not present

## 2023-08-22 MED ORDER — HYDROMORPHONE HCL 2 MG PO TABS
2.0000 mg | ORAL_TABLET | Freq: Every day | ORAL | 0 refills | Status: DC | PRN
  Filled 2023-08-22: qty 150, 30d supply, fill #0

## 2023-08-30 DIAGNOSIS — M47816 Spondylosis without myelopathy or radiculopathy, lumbar region: Secondary | ICD-10-CM | POA: Diagnosis not present

## 2023-09-16 DIAGNOSIS — Z91038 Other insect allergy status: Secondary | ICD-10-CM | POA: Diagnosis not present

## 2023-09-16 DIAGNOSIS — R21 Rash and other nonspecific skin eruption: Secondary | ICD-10-CM | POA: Diagnosis not present

## 2023-09-19 ENCOUNTER — Other Ambulatory Visit (HOSPITAL_COMMUNITY): Payer: Self-pay

## 2023-09-19 DIAGNOSIS — M47816 Spondylosis without myelopathy or radiculopathy, lumbar region: Secondary | ICD-10-CM | POA: Diagnosis not present

## 2023-09-19 DIAGNOSIS — G894 Chronic pain syndrome: Secondary | ICD-10-CM | POA: Diagnosis not present

## 2023-09-19 DIAGNOSIS — M7061 Trochanteric bursitis, right hip: Secondary | ICD-10-CM | POA: Diagnosis not present

## 2023-09-19 DIAGNOSIS — M5416 Radiculopathy, lumbar region: Secondary | ICD-10-CM | POA: Diagnosis not present

## 2023-09-19 MED ORDER — HYDROMORPHONE HCL 2 MG PO TABS
2.0000 mg | ORAL_TABLET | Freq: Every day | ORAL | 0 refills | Status: DC
  Filled 2023-09-19: qty 150, 30d supply, fill #0

## 2023-09-26 ENCOUNTER — Other Ambulatory Visit: Payer: Self-pay | Admitting: Student

## 2023-09-26 DIAGNOSIS — G8929 Other chronic pain: Secondary | ICD-10-CM

## 2023-09-26 DIAGNOSIS — M4807 Spinal stenosis, lumbosacral region: Secondary | ICD-10-CM

## 2023-09-27 ENCOUNTER — Ambulatory Visit
Admission: RE | Admit: 2023-09-27 | Discharge: 2023-09-27 | Disposition: A | Discharge status: Home / Self Care | Source: Ambulatory Visit | Attending: Student | Admitting: Student

## 2023-09-27 DIAGNOSIS — M545 Low back pain, unspecified: Secondary | ICD-10-CM | POA: Diagnosis not present

## 2023-09-27 DIAGNOSIS — M4807 Spinal stenosis, lumbosacral region: Secondary | ICD-10-CM | POA: Diagnosis not present

## 2023-09-27 DIAGNOSIS — M47816 Spondylosis without myelopathy or radiculopathy, lumbar region: Secondary | ICD-10-CM | POA: Diagnosis not present

## 2023-09-27 DIAGNOSIS — M5137 Other intervertebral disc degeneration, lumbosacral region with discogenic back pain only: Secondary | ICD-10-CM | POA: Diagnosis not present

## 2023-09-27 DIAGNOSIS — G8929 Other chronic pain: Secondary | ICD-10-CM | POA: Insufficient documentation

## 2023-09-27 DIAGNOSIS — M47817 Spondylosis without myelopathy or radiculopathy, lumbosacral region: Secondary | ICD-10-CM | POA: Diagnosis not present

## 2023-09-30 DIAGNOSIS — M47816 Spondylosis without myelopathy or radiculopathy, lumbar region: Secondary | ICD-10-CM | POA: Diagnosis not present

## 2023-10-03 ENCOUNTER — Ambulatory Visit: Admitting: Neurosurgery

## 2023-10-10 ENCOUNTER — Encounter: Payer: Self-pay | Admitting: Orthopedic Surgery

## 2023-10-10 ENCOUNTER — Other Ambulatory Visit (HOSPITAL_COMMUNITY): Payer: Self-pay | Admitting: Psychiatry

## 2023-10-10 DIAGNOSIS — F419 Anxiety disorder, unspecified: Secondary | ICD-10-CM

## 2023-10-10 DIAGNOSIS — F4310 Post-traumatic stress disorder, unspecified: Secondary | ICD-10-CM

## 2023-10-10 NOTE — Progress Notes (Signed)
 Referring Physician:  Charlene Debby BROCKS, PA-C [address] [place],  [place] [address]  Primary Physician:  Job Lukes, [place]  History of Present Illness: 10/17/2023 [name] White has a history of OSA, gastroparesis, IBS, dyslipidemia, chronic pelvic pain, morbid obesity, PTSD, bipolar 1 disorder, barretts esophagus, CAD, migraines.   She is on chronic dilaudid  from pain management. She's had injections in the past. They helped short term. Had facet injections years ago that helped longer.   She has history of right THA with persistent pain. Workup for issues with THA were negative. She had right GTB injection by ortho on 01/03/23. She had some short term relief with this. She does not have a follow up.   She has chronic LBP that has been worse in last 2 years. She has constant LBP with right lateral hip pain. No leg pain. She has numbness in her back and right thigh as well as intermittent tingling in both feet. Has constant numbness/tingling in right great toe. LBP is her primary complaint. Pain is worse with activity- walking, sitting, stairs, and rolling over. Some relief with TENS and ice.   She sees [facility]- she's been on dilaudid  x years.   Tobacco use: smokes 1 PPD x 40+ years.   Bowel/Bladder Dysfunction: intermittent bowel/bladder urgency x 1 year. No perineal numbness.   Conservative measures:  Physical therapy:  has not participated in recently-last PT 2023 Multimodal medical therapy including regular antiinflammatories: Tylenol , Dilaudid , Lyrica , Cymlbalta Injections:  07/21/2017- Bilateral TEFSI L5-S1 (Dr. Ema) 10/13/2017- Trochanteric Bursitis, Right  10/31/2017- Bilateral L2-S1 MBBs  11/10/2017- Bilateral L2-S1 MBBs  12/27/2017- Lumbar RF Ablation right side L3-S1  01/13/2018- LEFT lumbar RFA L3-S1  01/20/2021- Right L3-L4 medial branch and Right L5 RFA (Dr Cozetta) 08/13/2021- Right L3-L4 medial branch and Right L5 RFA 01/03/2023- R  greater trochanteric bursa injection  Past Surgery:  none  [name] White has no symptoms of cervical myelopathy.  The symptoms are causing a significant impact on the patient's life.   Review of Systems:  A 10 point review of systems is negative, except for the pertinent positives and negatives detailed in the HPI.  Past Medical History: Past Medical History:  Diagnosis Date   Anterolisthesis    Arthritis    Barrett's esophagus    egd 2007 + 2009 but none 2011 or 08/2010 egd   Bipolar 1 disorder (HCC)    CAD (coronary artery disease) 02/03/2021   a.) cCTA 02/03/2021: Ca score = 1 (72nd percentile for age/sex match control)   Chronic, continuous use of opioids    Colon polyp, hyperplastic    Depression    Diastolic dysfunction 02/06/2021   a.) TTE 02/06/2021: EF 55-60%, mild LVH, G1DD   DOE (dyspnea on exertion)    Family history of adverse reaction to anesthesia    a.) delayed emergence in 1st degree relative (brother)   Gastroparesis    Generalized anxiety disorder with panic attacks    GERD (gastroesophageal reflux disease)    Grief reaction    Hidradenitis suppurativa    History of kidney stones    HLD (hyperlipidemia)    IBS (irritable bowel syndrome)    Migraine    Morbid obesity (HCC)    OSA (obstructive sleep apnea)    a.) did not tolerate nocturnal PAP therapy mainly due to anxiety and associated xerostomia   Ovarian cyst    Palpitations    Plantar fasciitis    PTSD (post-traumatic stress disorder)  Restless leg    a.) on ropinirole    Suicide attempt (HCC) 2003   Tobacco abuse    Type 2 diabetes mellitus treated without insulin (HCC)     Past Surgical History: Past Surgical History:  Procedure Laterality Date   ABDOMINAL HYSTERECTOMY     secondary to endometriosis   BACK SURGERY Right 2019   lumbar ablation L3-S1 x 2    BIOPSY  05/07/2021   Procedure: BIOPSY;  Surgeon: Wilhelmenia Aloha Raddle., MD;  Location: [facility] ENDOSCOPY;  Service:  Gastroenterology;;   CESAREAN SECTION     x 2   CHOLECYSTECTOMY     COLONOSCOPY W/ POLYPECTOMY  2012   COLONOSCOPY WITH PROPOFOL  N/A 05/01/2020   Procedure: COLONOSCOPY WITH PROPOFOL ;  Surgeon: Wilhelmenia Aloha Raddle., MD;  Location: [facility] ENDOSCOPY;  Service: Gastroenterology;  Laterality: N/A;   COLONOSCOPY WITH PROPOFOL  N/A 05/07/2021   Procedure: COLONOSCOPY WITH PROPOFOL ;  Surgeon: Wilhelmenia Aloha Raddle., MD;  Location: [facility] ENDOSCOPY;  Service: Gastroenterology;  Laterality: N/A;   COLONOSCOPY WITH PROPOFOL  N/A 09/27/2022   Procedure: COLONOSCOPY WITH PROPOFOL ;  Surgeon: Mansouraty, Aloha Raddle., MD;  Location: [facility] ENDOSCOPY;  Service: Gastroenterology;  Laterality: N/A;   ELBOW SURGERY Left    ENDOSCOPIC MUCOSAL RESECTION N/A 05/01/2020   Procedure: ENDOSCOPIC MUCOSAL RESECTION;  Surgeon: Wilhelmenia Aloha Raddle., MD;  Location: [facility] ENDOSCOPY;  Service: Gastroenterology;  Laterality: N/A;   ENDOSCOPIC MUCOSAL RESECTION N/A 05/07/2021   Procedure: ENDOSCOPIC MUCOSAL RESECTION;  Surgeon: Wilhelmenia Aloha Raddle., MD;  Location: [facility] ENDOSCOPY;  Service: Gastroenterology;  Laterality: N/A;   GANGLION CYST EXCISION Right    HEMOSTASIS CLIP PLACEMENT  05/01/2020   Procedure: HEMOSTASIS CLIP PLACEMENT;  Surgeon: Wilhelmenia Aloha Raddle., MD;  Location: [facility] ENDOSCOPY;  Service: Gastroenterology;;   HEMOSTASIS CLIP PLACEMENT  05/07/2021   Procedure: HEMOSTASIS CLIP PLACEMENT;  Surgeon: Wilhelmenia Aloha Raddle., MD;  Location: [facility] ENDOSCOPY;  Service: Gastroenterology;;   HOT HEMOSTASIS N/A 05/07/2021   Procedure: HOT HEMOSTASIS (ARGON PLASMA COAGULATION/BICAP);  Surgeon: Wilhelmenia Aloha Raddle., MD;  Location: [facility] ENDOSCOPY;  Service: Gastroenterology;  Laterality: N/A;   LAPAROSCOPIC LYSIS OF ADHESIONS  04/22/2010   LAPAROSCOPY  04/30/2010   with mini laparotomy and cystotomy repair   PLANTAR FASCIA SURGERY Right    POLYPECTOMY  05/01/2020   Procedure: POLYPECTOMY;  Surgeon: Mansouraty, Aloha Raddle., MD;   Location: [facility] ENDOSCOPY;  Service: Gastroenterology;;   POLYPECTOMY  05/07/2021   Procedure: POLYPECTOMY;  Surgeon: Wilhelmenia Aloha Raddle., MD;  Location: [facility] ENDOSCOPY;  Service: Gastroenterology;;   POLYPECTOMY  09/27/2022   Procedure: POLYPECTOMY;  Surgeon: Wilhelmenia Aloha Raddle., MD;  Location: [facility] ENDOSCOPY;  Service: Gastroenterology;;   SALPINGECTOMY Right 04/22/2010   SHOULDER ARTHROSCOPY WITH SUBACROMIAL DECOMPRESSION, ROTATOR CUFF REPAIR AND BICEP TENDON REPAIR Right 03/18/2022   Procedure: SHOULDER ARTHROSCOPY WITH DEBRIDEMENT, DECOMPRESSION, ROTATOR CUFF REPAIR AND BICEPS TENOLYSIS;  Surgeon: Edie Norleen PARAS, MD;  Location: [facility] ORS;  Service: Orthopedics;  Laterality: Right;   SHOULDER OPEN ROTATOR CUFF REPAIR Right 03/18/2022   Procedure: OPEN ROTATOR CUFF REPAIR;  Surgeon: Edie Norleen PARAS, MD;  Location: [facility] ORS;  Service: Orthopedics;  Laterality: Right;   SUBMUCOSAL LIFTING INJECTION  05/01/2020   Procedure: SUBMUCOSAL LIFTING INJECTION;  Surgeon: Wilhelmenia Aloha Raddle., MD;  Location: [facility] ENDOSCOPY;  Service: Gastroenterology;;   SUBMUCOSAL LIFTING INJECTION  05/07/2021   Procedure: SUBMUCOSAL LIFTING INJECTION;  Surgeon: Wilhelmenia Aloha Raddle., MD;  Location: [facility] ENDOSCOPY;  Service: Gastroenterology;;   TOTAL HIP ARTHROPLASTY Right 04/25/2017   Procedure: RIGHT TOTAL HIP ARTHROPLASTY ANTERIOR APPROACH;  Surgeon: Fidel Rogue, MD;  Location: [facility] OR;  Service: Orthopedics;  Laterality: Right;  Needs RNFA   TUBAL LIGATION      Allergies: Allergies as of 10/17/2023 - Review Complete 10/14/2023  Allergen Reaction Noted   Morphine Hives and Nausea And Vomiting 07/07/2012   Penicillins Hives, Itching, and Other (See Comments) 10/04/2008   Clarithromycin  Nausea And Vomiting 02/02/2021    Medications: Outpatient Encounter Medications as of 10/17/2023  Medication Sig   acetaminophen  (TYLENOL ) 500 MG tablet Take 2,000 mg by mouth daily as needed for moderate pain.   DULoxetine   (CYMBALTA ) 60 MG capsule Take 1 capsule (60 mg total) by mouth at bedtime.   furosemide  (LASIX ) 20 MG tablet TAKE 1 TABLET BY MOUTH DAILY AS NEEDED FOR FLUID.   HYDROmorphone  (DILAUDID ) 2 MG tablet Take 1 tablet (2 mg total) by mouth 5 (five) times daily as needed for pain   HYDROmorphone  (DILAUDID ) 2 MG tablet Take 1 tablet (2 mg total) by mouth 5 (five) times daily as needed for pain.   lamoTRIgine  (LAMICTAL ) 200 MG tablet Take 1 tablet (200 mg total) by mouth at bedtime.   nitroGLYCERIN  (NITROSTAT ) 0.4 MG SL tablet Place 1 tablet (0.4 mg total) under the tongue every 5 (five) minutes as needed for chest pain.   pantoprazole  (PROTONIX ) 40 MG tablet TAKE 1 TABLET BY MOUTH EVERY DAY   pravastatin  (PRAVACHOL ) 40 MG tablet TAKE 1 TABLET BY MOUTH EVERY DAY   risperiDONE  (RISPERDAL ) 1 MG tablet Take 1 tablet (1 mg total) by mouth at bedtime.   rizatriptan  (MAXALT -MLT) 10 MG disintegrating tablet Take 1 tablet (10 mg total) by mouth as needed for migraine. May repeat in 2 hours if needed.  Maximum 2 tablets in 24 hours   rOPINIRole  (REQUIP ) 1 MG tablet TAKE 2 TABLETS BY MOUTH AT BEDTIME   Semaglutide , 1 MG/DOSE, 4 MG/3ML SOPN Inject 1 mg as directed once a week.   topiramate  (TOPAMAX ) 200 MG tablet TAKE 1 TABLET BY MOUTH EVERYDAY AT BEDTIME   traZODone  (DESYREL ) 150 MG tablet Take 1 tablet (150 mg total) by mouth at bedtime.   [DISCONTINUED] DULoxetine  (CYMBALTA ) 60 MG capsule Take 1 capsule (60 mg total) by mouth at bedtime.   [DISCONTINUED] lamoTRIgine  (LAMICTAL ) 200 MG tablet Take 1 tablet (200 mg total) by mouth at bedtime.   [DISCONTINUED] linaclotide  (LINZESS ) 145 MCG CAPS capsule Take 1 capsule (145 mcg total) by mouth daily before breakfast.   [DISCONTINUED] meloxicam (MOBIC) 7.5 MG tablet Take 7.5 mg by mouth 2 (two) times daily.   [DISCONTINUED] metFORMIN  (GLUCOPHAGE -XR) 500 MG 24 hr tablet TAKE 1 TABLET BY MOUTH EVERY DAY WITH BREAKFAST   [DISCONTINUED] risperiDONE  (RISPERDAL ) 1 MG tablet  Take 1 tablet (1 mg total) by mouth at bedtime.   [DISCONTINUED] Semaglutide ,0.25 or 0.5MG /DOS, (OZEMPIC , 0.25 OR 0.5 MG/DOSE,) 2 MG/3ML SOPN INJECT 0.25MG  INTO THE SKIN ONE TIME PER WEEK (Patient taking differently: Inject 0.5 mg into the skin once a week.)   [DISCONTINUED] traZODone  (DESYREL ) 150 MG tablet Take 1 tablet (150 mg total) by mouth at bedtime.   No facility-administered encounter medications on file as of 10/17/2023.    Social History: Social History   Tobacco Use   Smoking status: Every Day    Current packs/day: 0.00    Average packs/day: 1 pack/day for 40.4 years (40.4 ttl pk-yrs)    Types: Cigarettes    Start date: 10/16/1980    Last attempt to quit: 2023    Years since quitting: 2.6  Smokeless tobacco: Never   Tobacco comments:    Has smoked off and on since the age of 78, quit while pregnant twice and also quit in 2017 but restarted in 2022  Vaping Use   Vaping status: Never Used  Substance Use Topics   Alcohol  use: Not Currently    Alcohol /week: 6.0 standard drinks of alcohol     Types: 6 Standard drinks or equivalent per week   Drug use: No    Family Medical History: Family History  Problem Relation Age of Onset   COPD Mother    Stroke Mother    Coronary artery disease Mother        pacer   Colon polyps Mother    Breast cancer Mother    Migraines Mother    Other Mother        RSDS   Rheum arthritis Mother    Hypertension Father    Colon polyps Brother    Colon cancer Maternal Grandfather    Colon polyps Maternal Grandfather    Breast cancer Maternal Aunt    Uterine cancer Cousin        x 2   Aneurysm Maternal Grandmother    Kidney disease Brother    Other Brother        back pain   Kidney cancer Paternal Aunt    Breast cancer Cousin    Stomach cancer Neg Hx    Pancreatic cancer Neg Hx    Esophageal cancer Neg Hx    Liver disease Neg Hx    Inflammatory bowel disease Neg Hx     Physical Examination: Vitals:   10/17/23 1316  BP: 130/72     General: Patient is well developed, well nourished, calm, collected, and in no apparent distress. Attention to examination is appropriate.  Respiratory: Patient is breathing without any difficulty.   NEUROLOGICAL:     Awake, alert, oriented to person, place, and time.  Speech is clear and fluent. Fund of knowledge is appropriate.   Cranial Nerves: Pupils equal round and reactive to light.  Facial tone is symmetric.    She has diffuse posterior lumbar tenderness.   No abnormal lesions on exposed skin.   Strength: Side Biceps Triceps Deltoid Interossei Grip Wrist Ext. Wrist Flex.  R 5 5 5 5 5 5 5   L 5 5 5 5 5 5 5    Side Iliopsoas Quads Hamstring PF DF EHL  R 5 5 5 5 5 5   L 5 5 5 5 5 5    Reflexes are 2+ and symmetric at the biceps, brachioradialis, patella and achilles.   Hoffman's is absent.  Clonus is not present.   Bilateral upper and lower extremity sensation is intact to light touch.     She has groin and LBP with IR/ER of right hip. She has LBP with IR/ER of left hip.   Gait is slow.    Medical Decision Making  Imaging: Lumbar MRI dated 09/27/23:  FINDINGS: Segmentation: Standard.   Alignment:  Physiologic lumbar alignment is maintained.   Vertebrae: Vertebral bodies demonstrate normal signal intensity. No compression fractures.   Conus medullaris and cauda equina: The conus medullaris terminates at the level of L1-L2. The distal spinal cord signal intensity is normal.   Paraspinal and other soft tissues: The visualized abdomen and pelvis show no soft tissue abnormality. The visualized aorta is normal.   Disc levels:   L1-L2: Disc is normal in configuration. No facet arthropathy. No neuroforaminal stenosis. No spinal canal stenosis.  L2-L3: Disc is normal in configuration. No facet arthropathy. No neuroforaminal stenosis. No spinal canal stenosis.   L3-L4: Disc bulge. Moderate bilateral facet arthropathy. No neuroforaminal stenosis. Mild spinal  canal stenosis.   L4-L5: Disc bulge. Moderate facet arthropathy. No neuroforaminal stenosis. No spinal canal stenosis.   L5-S1: Disc bulge. Mild facet arthropathy. Moderate bilateral neuroforaminal stenosis. No spinal canal stenosis.   IMPRESSION: 1. Moderate bilateral foraminal stenosis at L5-S1 secondary to disc bulging and facet arthropathy. 2. Mild canal stenosis at L3-L4 secondary to disc bulging and facet arthropathy.     Electronically Signed   By: Clem Savory M.D.   On: 10/04/2023 09:45    I have personally reviewed the images and agree with the above interpretation.  Assessment and Plan: [name] White has a history of right THA with persistent pain. Workup for issues with THA were negative. She had right GTB injection by ortho on 01/03/23. She had some short term relief with this.  She has chronic LBP that has been worse in last 2 years. She has constant LBP with right lateral hip pain. No leg pain. She has numbness in her back and right thigh as well as intermittent tingling in both feet. Has constant numbness/tingling in right great toe. LBP is her primary complaint.   She has known lumbar spondylosis L3-S1 with mild central stenosis L3-L4 and moderate bilateral foraminal stenosis L5-S1.   LBP is likely due to lumbar spondylosis/facets. Does not appear to be symptomatic from foraminal or central stenosis.   Treatment options discussed with patient and following plan made:   - Lumbar xrays with flex/ext ordered to look for any instability.  - Referral to [facility] in [facility]) to consider lumbar injections.  - She will continue to see [facility] for her chronic dilaudid .  - Will message her with results of lumbar xrays.  - Follow up in 6-8 weeks and prn.   I spent a total of 40 minutes in face-to-face and non-face-to-face activities related to this patient's care today including review of outside records, review of imaging, review of  symptoms, physical exam, discussion of differential diagnosis, discussion of treatment options, and documentation.   Thank you for involving me in the care of this patient.   Glade Boys PA-C Dept. of Neurosurgery

## 2023-10-12 ENCOUNTER — Encounter: Payer: Self-pay | Admitting: Physician Assistant

## 2023-10-12 ENCOUNTER — Ambulatory Visit: Payer: Self-pay | Admitting: Physician Assistant

## 2023-10-12 ENCOUNTER — Ambulatory Visit: Admitting: Physician Assistant

## 2023-10-12 VITALS — BP 120/80 | HR 92 | Temp 98.2°F | Ht 65.0 in | Wt 221.2 lb

## 2023-10-12 DIAGNOSIS — E119 Type 2 diabetes mellitus without complications: Secondary | ICD-10-CM | POA: Diagnosis not present

## 2023-10-12 DIAGNOSIS — Z23 Encounter for immunization: Secondary | ICD-10-CM

## 2023-10-12 DIAGNOSIS — Z7984 Long term (current) use of oral hypoglycemic drugs: Secondary | ICD-10-CM

## 2023-10-12 LAB — COMPREHENSIVE METABOLIC PANEL WITH GFR
ALT: 21 U/L (ref 0–35)
AST: 20 U/L (ref 0–37)
Albumin: 4.1 g/dL (ref 3.5–5.2)
Alkaline Phosphatase: 66 U/L (ref 39–117)
BUN: 8 mg/dL (ref 6–23)
CO2: 26 meq/L (ref 19–32)
Calcium: 9.3 mg/dL (ref 8.4–10.5)
Chloride: 105 meq/L (ref 96–112)
Creatinine, Ser: 0.65 mg/dL (ref 0.40–1.20)
GFR: 96.67 mL/min (ref >60.00)
Glucose, Bld: 83 mg/dL (ref 70–99)
Potassium: 3.5 meq/L (ref 3.5–5.1)
Sodium: 138 meq/L (ref 135–145)
Total Bilirubin: 0.3 mg/dL (ref 0.2–1.2)
Total Protein: 7.4 g/dL (ref 6.0–8.3)

## 2023-10-12 LAB — HEMOGLOBIN A1C: Hgb A1c MFr Bld: 6 % (ref 4.6–6.5)

## 2023-10-12 LAB — MICROALBUMIN / CREATININE URINE RATIO
Creatinine,U: 103.6 mg/dL
Microalb Creat Ratio: UNDETERMINED mg/g (ref 0.0–30.0)
Microalb, Ur: <0.7 mg/dL

## 2023-10-12 MED ORDER — SEMAGLUTIDE (1 MG/DOSE) 4 MG/3ML ~~LOC~~ SOPN: 1.0000 mg | PEN_INJECTOR | SUBCUTANEOUS | 1 refills | Status: DC

## 2023-10-12 NOTE — Progress Notes (Signed)
 [name] White is a 58 y.o. female here for a follow up of a pre-existing problem.  History of Present Illness:   Chief Complaint  Patient presents with   Diabetes    Pt here for 3 month f/u. Doing well on Ozempic , having vomiting 1-2 x's a week.   Diabetes 6 month follow-up. Current DM meds: Ozempic  0.5 mg weekly and metformin  500 mg daily. Blood sugars at home are: not checked. Patient is compliant with medications. Denies: hypoglycemic or hyperglycemic episodes or symptoms. She continues on pravastatin  40 mg daily and tolerating well.  She does have vomiting 1-2 per week but is not bothered by this and prefers to stay on this medication.  Lab Results  Component Value Date   HGBA1C 6.1 04/05/2023      Past Medical History:  Diagnosis Date   Anterolisthesis    Arthritis    Barrett's esophagus    egd 2007 + 2009 but none 2011 or 08/2010 egd   Bipolar 1 disorder (HCC)    CAD (coronary artery disease) 02/03/2021   a.) cCTA 02/03/2021: Ca score = 1 (72nd percentile for age/sex match control)   Chronic, continuous use of opioids    Colon polyp, hyperplastic    Depression    Diastolic dysfunction 02/06/2021   a.) TTE 02/06/2021: EF 55-60%, mild LVH, G1DD   DOE (dyspnea on exertion)    Family history of adverse reaction to anesthesia    a.) delayed emergence in 1st degree relative (brother)   Gastroparesis    Generalized anxiety disorder with panic attacks    GERD (gastroesophageal reflux disease)    Grief reaction    Hidradenitis suppurativa    History of kidney stones    HLD (hyperlipidemia)    IBS (irritable bowel syndrome)    Migraine    Morbid obesity (HCC)    OSA (obstructive sleep apnea)    a.) did not tolerate nocturnal PAP therapy mainly due to anxiety and associated xerostomia   Ovarian cyst    Palpitations    Plantar fasciitis    PTSD (post-traumatic stress disorder)    Restless leg    a.) on ropinirole    Suicide attempt (HCC) 2003   Tobacco abuse    Type  2 diabetes mellitus treated without insulin (HCC)      Social History   Tobacco Use   Smoking status: Every Day    Current packs/day: 0.00    Average packs/day: 1 pack/day for 40.4 years (40.4 ttl pk-yrs)    Types: Cigarettes    Start date: 10/16/1980    Last attempt to quit: 2023    Years since quitting: 2.6   Smokeless tobacco: Never   Tobacco comments:    Has smoked off and on since the age of 79, quit while pregnant twice and also quit in 2017 but restarted in 2022  Vaping Use   Vaping status: Never Used  Substance Use Topics   Alcohol  use: Not Currently    Alcohol /week: 6.0 standard drinks of alcohol     Types: 6 Standard drinks or equivalent per week    Comment: wine/liquor 1 x a month   Drug use: No    Past Surgical History:  Procedure Laterality Date   ABDOMINAL HYSTERECTOMY     secondary to endometriosis   BACK SURGERY Right 2019   lumbar ablation L3-S1 x 2    BIOPSY  05/07/2021   Procedure: BIOPSY;  Surgeon: Wilhelmenia Aloha Raddle., MD;  Location: [facility] ENDOSCOPY;  Service: Gastroenterology;;  CESAREAN SECTION     x 2   CHOLECYSTECTOMY     COLONOSCOPY W/ POLYPECTOMY  2012   COLONOSCOPY WITH PROPOFOL  N/A 05/01/2020   Procedure: COLONOSCOPY WITH PROPOFOL ;  Surgeon: Mansouraty, Aloha Raddle., MD;  Location: [facility] ENDOSCOPY;  Service: Gastroenterology;  Laterality: N/A;   COLONOSCOPY WITH PROPOFOL  N/A 05/07/2021   Procedure: COLONOSCOPY WITH PROPOFOL ;  Surgeon: Mansouraty, Aloha Raddle., MD;  Location: [facility] ENDOSCOPY;  Service: Gastroenterology;  Laterality: N/A;   COLONOSCOPY WITH PROPOFOL  N/A 09/27/2022   Procedure: COLONOSCOPY WITH PROPOFOL ;  Surgeon: Mansouraty, Aloha Raddle., MD;  Location: [facility] ENDOSCOPY;  Service: Gastroenterology;  Laterality: N/A;   ELBOW SURGERY Left    ENDOSCOPIC MUCOSAL RESECTION N/A 05/01/2020   Procedure: ENDOSCOPIC MUCOSAL RESECTION;  Surgeon: Wilhelmenia Aloha Raddle., MD;  Location: [facility] ENDOSCOPY;  Service: Gastroenterology;  Laterality: N/A;    ENDOSCOPIC MUCOSAL RESECTION N/A 05/07/2021   Procedure: ENDOSCOPIC MUCOSAL RESECTION;  Surgeon: Wilhelmenia Aloha Raddle., MD;  Location: [facility] ENDOSCOPY;  Service: Gastroenterology;  Laterality: N/A;   GANGLION CYST EXCISION Right    HEMOSTASIS CLIP PLACEMENT  05/01/2020   Procedure: HEMOSTASIS CLIP PLACEMENT;  Surgeon: Wilhelmenia Aloha Raddle., MD;  Location: [facility] ENDOSCOPY;  Service: Gastroenterology;;   HEMOSTASIS CLIP PLACEMENT  05/07/2021   Procedure: HEMOSTASIS CLIP PLACEMENT;  Surgeon: Wilhelmenia Aloha Raddle., MD;  Location: [facility] ENDOSCOPY;  Service: Gastroenterology;;   HOT HEMOSTASIS N/A 05/07/2021   Procedure: HOT HEMOSTASIS (ARGON PLASMA COAGULATION/BICAP);  Surgeon: Wilhelmenia Aloha Raddle., MD;  Location: [facility] ENDOSCOPY;  Service: Gastroenterology;  Laterality: N/A;   LAPAROSCOPIC LYSIS OF ADHESIONS  04/22/2010   LAPAROSCOPY  04/30/2010   with mini laparotomy and cystotomy repair   PLANTAR FASCIA SURGERY Right    POLYPECTOMY  05/01/2020   Procedure: POLYPECTOMY;  Surgeon: Mansouraty, Aloha Raddle., MD;  Location: [facility] ENDOSCOPY;  Service: Gastroenterology;;   POLYPECTOMY  05/07/2021   Procedure: POLYPECTOMY;  Surgeon: Wilhelmenia Aloha Raddle., MD;  Location: [facility] ENDOSCOPY;  Service: Gastroenterology;;   POLYPECTOMY  09/27/2022   Procedure: POLYPECTOMY;  Surgeon: Wilhelmenia Aloha Raddle., MD;  Location: [facility] ENDOSCOPY;  Service: Gastroenterology;;   SALPINGECTOMY Right 04/22/2010   SHOULDER ARTHROSCOPY WITH SUBACROMIAL DECOMPRESSION, ROTATOR CUFF REPAIR AND BICEP TENDON REPAIR Right 03/18/2022   Procedure: SHOULDER ARTHROSCOPY WITH DEBRIDEMENT, DECOMPRESSION, ROTATOR CUFF REPAIR AND BICEPS TENOLYSIS;  Surgeon: Edie Norleen PARAS, MD;  Location: [facility] ORS;  Service: Orthopedics;  Laterality: Right;   SHOULDER OPEN ROTATOR CUFF REPAIR Right 03/18/2022   Procedure: OPEN ROTATOR CUFF REPAIR;  Surgeon: Edie Norleen PARAS, MD;  Location: [facility] ORS;  Service: Orthopedics;  Laterality: Right;   SUBMUCOSAL LIFTING  INJECTION  05/01/2020   Procedure: SUBMUCOSAL LIFTING INJECTION;  Surgeon: Wilhelmenia Aloha Raddle., MD;  Location: [facility] ENDOSCOPY;  Service: Gastroenterology;;   SUBMUCOSAL LIFTING INJECTION  05/07/2021   Procedure: SUBMUCOSAL LIFTING INJECTION;  Surgeon: Wilhelmenia Aloha Raddle., MD;  Location: [facility] ENDOSCOPY;  Service: Gastroenterology;;   TOTAL HIP ARTHROPLASTY Right 04/25/2017   Procedure: RIGHT TOTAL HIP ARTHROPLASTY ANTERIOR APPROACH;  Surgeon: Fidel Rogue, MD;  Location: [facility] OR;  Service: Orthopedics;  Laterality: Right;  Needs RNFA   TUBAL LIGATION      Family History  Problem Relation Age of Onset   COPD Mother    Stroke Mother    Coronary artery disease Mother        pacer   Colon polyps Mother    Breast cancer Mother    Migraines Mother    Other Mother        RSDS   Rheum arthritis Mother  Hypertension Father    Colon polyps Brother    Colon cancer Maternal Grandfather    Colon polyps Maternal Grandfather    Breast cancer Maternal Aunt    Uterine cancer Cousin        x 2   Aneurysm Maternal Grandmother    Kidney disease Brother    Other Brother        back pain   Kidney cancer Paternal Aunt    Breast cancer Cousin    Stomach cancer Neg Hx    Pancreatic cancer Neg Hx    Esophageal cancer Neg Hx    Liver disease Neg Hx    Inflammatory bowel disease Neg Hx     Allergies  Allergen Reactions   Morphine Hives and Nausea And Vomiting    INTOLERANCE >  N & V   Penicillins Hives, Itching and Other (See Comments)     PATIENT HAS HAD A PCN REACTION WITH IMMEDIATE RASH, FACIAL/TONGUE/THROAT SWELLING, SOB, OR LIGHTHEADEDNESS WITH HYPOTENSION:  #  #  #  YES  #  #  #   Has patient had a PCN reaction causing severe rash involving mucus membranes or skin necrosis: Unknown Has patient had a PCN reaction that required hospitalization: No Has patient had a PCN reaction occurring within the last 10 years: No If all of the above answers are NO, then may proceed with  Cephalosporin use.    Clarithromycin  Nausea And Vomiting    Current Medications:   Current Outpatient Medications:    acetaminophen  (TYLENOL ) 500 MG tablet, Take 2,000 mg by mouth daily as needed for moderate pain., Disp: , Rfl:    DULoxetine  (CYMBALTA ) 60 MG capsule, Take 1 capsule (60 mg total) by mouth at bedtime., Disp: 30 capsule, Rfl: 2   furosemide  (LASIX ) 20 MG tablet, TAKE 1 TABLET BY MOUTH DAILY AS NEEDED FOR FLUID., Disp: 90 tablet, Rfl: 1   HYDROmorphone  (DILAUDID ) 2 MG tablet, Take 1 tablet (2 mg total) by mouth 5 (five) times daily as needed for pain, Disp: 150 tablet, Rfl: 0   HYDROmorphone  (DILAUDID ) 2 MG tablet, Take 1 tablet (2 mg total) by mouth 5 (five) times daily as needed for pain., Disp: 150 tablet, Rfl: 0   lamoTRIgine  (LAMICTAL ) 200 MG tablet, Take 1 tablet (200 mg total) by mouth at bedtime., Disp: 30 tablet, Rfl: 2   meloxicam (MOBIC) 7.5 MG tablet, Take 7.5 mg by mouth 2 (two) times daily., Disp: , Rfl:    nitroGLYCERIN  (NITROSTAT ) 0.4 MG SL tablet, Place 1 tablet (0.4 mg total) under the tongue every 5 (five) minutes as needed for chest pain., Disp: 50 tablet, Rfl: 3   pantoprazole  (PROTONIX ) 40 MG tablet, TAKE 1 TABLET BY MOUTH EVERY DAY, Disp: 90 tablet, Rfl: 1   pravastatin  (PRAVACHOL ) 40 MG tablet, TAKE 1 TABLET BY MOUTH EVERY DAY, Disp: 90 tablet, Rfl: 1   risperiDONE  (RISPERDAL ) 1 MG tablet, Take 1 tablet (1 mg total) by mouth at bedtime., Disp: 30 tablet, Rfl: 2   rizatriptan  (MAXALT -MLT) 10 MG disintegrating tablet, Take 1 tablet (10 mg total) by mouth as needed for migraine. May repeat in 2 hours if needed.  Maximum 2 tablets in 24 hours, Disp: 12 tablet, Rfl: 11   rOPINIRole  (REQUIP ) 1 MG tablet, TAKE 2 TABLETS BY MOUTH AT BEDTIME, Disp: 180 tablet, Rfl: 1   Semaglutide , 1 MG/DOSE, 4 MG/3ML SOPN, Inject 1 mg as directed once a week., Disp: 3 mL, Rfl: 1   topiramate  (TOPAMAX ) 200 MG tablet,  TAKE 1 TABLET BY MOUTH EVERYDAY AT BEDTIME, Disp: 90 tablet, Rfl:  3   traZODone  (DESYREL ) 150 MG tablet, Take 1 tablet (150 mg total) by mouth at bedtime., Disp: 30 tablet, Rfl: 2   Review of Systems:   Negative unless otherwise specified per HPI.  Vitals:   Vitals:   10/12/23 1027  BP: 120/80  Pulse: 92  Temp: 98.2 F (36.8 C)  TempSrc: Temporal  SpO2: 97%  Weight: 221 lb 4 oz (100.4 kg)  Height: 5' 5 (1.651 m)     Body mass index is 36.82 kg/m.  Physical Exam:   Physical Exam Constitutional:      Appearance: Normal appearance. She is well-developed.  HENT:     Head: Normocephalic and atraumatic.  Eyes:     General: Lids are normal.     Extraocular Movements: Extraocular movements intact.     Conjunctiva/sclera: Conjunctivae normal.  Pulmonary:     Effort: Pulmonary effort is normal.  Musculoskeletal:        General: Normal range of motion.     Cervical back: Normal range of motion and neck supple.  Skin:    General: Skin is warm and dry.  Neurological:     Mental Status: She is alert and oriented to person, place, and time.  Psychiatric:        Attention and Perception: Attention and perception normal.        Mood and Affect: Mood normal.        Behavior: Behavior normal.        Thought Content: Thought content normal.        Judgment: Judgment normal.     Assessment and Plan:   Diabetes mellitus without complication (HCC) Suspect well controlled Increase Ozempic  to 1 mg as tolerated - reach out if vomiting increases Update blood work Follow up in 3 month(s), sooner if concerns  Need for prophylactic vaccination against Streptococcus pneumoniae (pneumococcus) Updated today   Lucie Buttner, PA-C

## 2023-10-14 ENCOUNTER — Telehealth (HOSPITAL_COMMUNITY): Admitting: Psychiatry

## 2023-10-14 ENCOUNTER — Other Ambulatory Visit: Payer: Self-pay | Admitting: Physician Assistant

## 2023-10-14 ENCOUNTER — Encounter (HOSPITAL_COMMUNITY): Payer: Self-pay | Admitting: Psychiatry

## 2023-10-14 ENCOUNTER — Other Ambulatory Visit (HOSPITAL_COMMUNITY): Payer: Self-pay | Admitting: Psychiatry

## 2023-10-14 ENCOUNTER — Telehealth (HOSPITAL_BASED_OUTPATIENT_CLINIC_OR_DEPARTMENT_OTHER): Admitting: Psychiatry

## 2023-10-14 VITALS — Wt 221.0 lb

## 2023-10-14 DIAGNOSIS — F419 Anxiety disorder, unspecified: Secondary | ICD-10-CM

## 2023-10-14 DIAGNOSIS — F319 Bipolar disorder, unspecified: Secondary | ICD-10-CM

## 2023-10-14 DIAGNOSIS — F4310 Post-traumatic stress disorder, unspecified: Secondary | ICD-10-CM

## 2023-10-14 MED ORDER — DULOXETINE HCL 60 MG PO CPEP: 60.0000 mg | ORAL_CAPSULE | Freq: Every day | ORAL | 2 refills | Status: DC

## 2023-10-14 MED ORDER — RISPERIDONE 1 MG PO TABS: 1.0000 mg | ORAL_TABLET | Freq: Every day | ORAL | 2 refills | Status: DC

## 2023-10-14 MED ORDER — LAMOTRIGINE 200 MG PO TABS: 200.0000 mg | ORAL_TABLET | Freq: Every day | ORAL | 2 refills | Status: DC

## 2023-10-14 MED ORDER — TRAZODONE HCL 150 MG PO TABS: 150.0000 mg | ORAL_TABLET | Freq: Every day | ORAL | 2 refills | Status: DC
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
## 2023-10-17 ENCOUNTER — Encounter: Payer: Self-pay | Admitting: Orthopedic Surgery

## 2023-10-17 ENCOUNTER — Ambulatory Visit: Admitting: Orthopedic Surgery

## 2023-10-17 ENCOUNTER — Ambulatory Visit
Admission: RE | Admit: 2023-10-17 | Discharge: 2023-10-17 | Disposition: A | Discharge status: Home / Self Care | Attending: Orthopedic Surgery | Admitting: Orthopedic Surgery

## 2023-10-17 ENCOUNTER — Ambulatory Visit
Admission: RE | Admit: 2023-10-17 | Discharge: 2023-10-17 | Disposition: A | Discharge status: Home / Self Care | Source: Ambulatory Visit | Attending: Orthopedic Surgery | Admitting: Orthopedic Surgery

## 2023-10-17 VITALS — BP 130/72 | Ht 65.0 in | Wt 224.0 lb

## 2023-10-17 DIAGNOSIS — M48061 Spinal stenosis, lumbar region without neurogenic claudication: Secondary | ICD-10-CM

## 2023-10-17 DIAGNOSIS — M47816 Spondylosis without myelopathy or radiculopathy, lumbar region: Secondary | ICD-10-CM

## 2023-10-17 DIAGNOSIS — M4807 Spinal stenosis, lumbosacral region: Secondary | ICD-10-CM

## 2023-10-17 DIAGNOSIS — M47817 Spondylosis without myelopathy or radiculopathy, lumbosacral region: Secondary | ICD-10-CM

## 2023-10-17 DIAGNOSIS — M4316 Spondylolisthesis, lumbar region: Secondary | ICD-10-CM | POA: Diagnosis not present

## 2023-10-17 DIAGNOSIS — Z471 Aftercare following joint replacement surgery: Secondary | ICD-10-CM | POA: Diagnosis not present

## 2023-10-17 DIAGNOSIS — Z96641 Presence of right artificial hip joint: Secondary | ICD-10-CM | POA: Diagnosis not present

## 2023-10-17 NOTE — Patient Instructions (Signed)
 It was so nice to see you today. Thank you so much for coming in.    You have some wear and tear (arthritis) in your back and I think this is causing the back pain. You have some spinal stenosis at L3-L4 and some irritation of nerves on right/left at L5-S1 but I don't think you are having symptoms from these areas.   I ordered xrays of your lower back. You can get these at [facility] (building with the white pillars) off of [address]. The address is [address], [place], [place] [address]. You do not need any appointment. I will message you with results.   I want you to see physical medicine and rehab at [facility] in [place] (Dr. Israel) to discuss possible lumbar injections. They should call you to schedule an appointment or you can call them at [phone]. They are located at [address] [place], [place] [address].   Continue to see pain management at [facility] for your pain medications.    I will see you back in 6-8 weeks. Please do not hesitate to call if you have any questions or concerns. You can also message me in MyChart.   Glade Boys PA-C [phone]     The physicians and staff at [facility] at [facility] are committed to providing excellent care. You may receive a survey asking for feedback about your experience at our office. We value you your feedback and appreciate you taking the time to to fill it out. The [facility] leadership team is also available to discuss your experience in person, feel free to contact us  [phone].

## 2023-10-19 ENCOUNTER — Other Ambulatory Visit (HOSPITAL_COMMUNITY): Payer: Self-pay

## 2023-10-19 DIAGNOSIS — M7061 Trochanteric bursitis, right hip: Secondary | ICD-10-CM | POA: Diagnosis not present

## 2023-10-19 DIAGNOSIS — M5416 Radiculopathy, lumbar region: Secondary | ICD-10-CM | POA: Diagnosis not present

## 2023-10-19 DIAGNOSIS — G894 Chronic pain syndrome: Secondary | ICD-10-CM | POA: Diagnosis not present

## 2023-10-19 DIAGNOSIS — M47816 Spondylosis without myelopathy or radiculopathy, lumbar region: Secondary | ICD-10-CM | POA: Diagnosis not present

## 2023-10-19 MED ORDER — HYDROMORPHONE HCL 2 MG PO TABS
2.0000 mg | ORAL_TABLET | Freq: Every day | ORAL | 0 refills | Status: DC
  Filled 2023-10-19 (×2): qty 150, 30d supply, fill #0

## 2023-10-20 ENCOUNTER — Ambulatory Visit: Payer: PPO

## 2023-10-20 ENCOUNTER — Telehealth: Payer: Self-pay

## 2023-10-20 VITALS — Ht 65.0 in | Wt 221.0 lb

## 2023-10-20 DIAGNOSIS — Z0000 Encounter for general adult medical examination without abnormal findings: Secondary | ICD-10-CM

## 2023-10-20 DIAGNOSIS — Z139 Encounter for screening, unspecified: Secondary | ICD-10-CM

## 2023-10-20 DIAGNOSIS — F17210 Nicotine dependence, cigarettes, uncomplicated: Secondary | ICD-10-CM

## 2023-10-20 DIAGNOSIS — Z122 Encounter for screening for malignant neoplasm of respiratory organs: Secondary | ICD-10-CM

## 2023-10-20 DIAGNOSIS — Z87891 Personal history of nicotine dependence: Secondary | ICD-10-CM

## 2023-10-20 NOTE — Telephone Encounter (Signed)
 Lung Cancer Screening Narrative/Criteria Questionnaire (Cigarette Smokers Only- No Cigars/Pipes/vapes)   [name] White   SDMV:10/31/2023 at 9:30 am with [facility]        04-29-1965               LDCT: 11/02/2023 at 11:40 at [facility]    59 y.o.                         Phone: [phone]  Lung Screening Narrative (confirm age 37-77 yrs Medicare / 50-80 yrs Private pay insurance)   Insurance information: HTA   Referring Provider: Job, NP   This screening involves an initial phone call with a team member from our program. It is called a shared decision making visit. The initial meeting is required by  insurance and Medicare to make sure you understand the program. This appointment takes about 15-20 minutes to complete. You will complete the screening scan at your scheduled date/time.  This scan takes about 5-10 minutes to complete. You can eat and drink normally before and after the scan.  Criteria questions for Lung Cancer Screening:   Are you a current or former smoker? Current Age began smoking: 15   If you are a former smoker, what year did you quit smoking? Quit 2 years total and started back. (within 15 yrs)   To calculate your smoking history, I need an accurate estimate of how many packs of cigarettes you smoked per day and for how many years. (Not just the number of PPD you are now smoking)   Years smoking 41 x Packs per day 2 = Pack years 82   (at least 20 pack yrs)   (Make sure they understand that we need to know how much they have smoked in the past, not just the number of PPD they are smoking now)  Do you have a personal history of cancer?  No    Do you have a family history of cancer? Yes  (cancer type and and relative) Mother had breast cancer. Grandfather had colon cancer.   Are you coughing up blood?  No  Have you had unexplained weight loss of 15 lbs or more in the last 6 months? No  It looks like you meet all criteria.  When would be a good time for us  to schedule you for  this screening?   Additional information: N/A

## 2023-10-20 NOTE — Patient Instructions (Signed)
 Ms. [name] White , Thank you for taking time out of your busy schedule to complete your Annual Wellness Visit with me. I enjoyed our conversation and look forward to speaking with you again next year. I, as well as your care team,  appreciate your ongoing commitment to your health goals. Please review the following plan we discussed and let me know if I can assist you in the future. Your Game plan/ To Do List    Referrals: If you haven't heard from the office you've been referred to, please reach out to them at the phone provided.   Follow up Visits: We will see or speak with you next year for your Next Medicare AWV with our clinical staff Have you seen your provider in the last 6 months (3 months if uncontrolled diabetes)? Yes  Clinician Recommendations:  Aim for 30 minutes of exercise or brisk walking, 6-8 glasses of water, and 5 servings of fruits and vegetables each day.       This is a list of the screenings recommended for you:  Health Maintenance  Topic Date Due   Screening for Lung Cancer  11/04/2022   Medicare Annual Wellness Visit  10/14/2023   Flu Shot  05/22/2024*   Hepatitis B Vaccine (1 of 3 - 19+ 3-dose series) 10/11/2024*   Eye exam for diabetics  03/22/2024   Complete foot exam   04/04/2024   Hemoglobin A1C  04/13/2024   Yearly kidney function blood test for diabetes  10/11/2024   Yearly kidney health urinalysis for diabetes  10/11/2024   Mammogram  11/25/2024   DTaP/Tdap/Td vaccine (4 - Td or Tdap) 11/27/2029   Colon Cancer Screening  09/26/2032   Pneumococcal Vaccine for age over 17  Completed   Hepatitis C Screening  Completed   HIV Screening  Completed   Zoster (Shingles) Vaccine  Completed   HPV Vaccine  Aged Out   Meningitis B Vaccine  Aged Out   COVID-19 Vaccine  Discontinued  *Topic was postponed. The date shown is not the original due date.    Advanced directives: (Copy Requested) Please bring a copy of your health care power of attorney and living will to the  office to be added to your chart at your convenience. You can mail to [facility] [address] [place], [place] [address] or email to [email or web address] Advance Care Planning is important because it:  [x]  Makes sure you receive the medical care that is consistent with your values, goals, and preferences  [x]  It provides guidance to your family and loved ones and reduces their decisional burden about whether or not they are making the right decisions based on your wishes.  Follow the link provided in your after visit summary or read over the paperwork we have mailed to you to help you started getting your Advance Directives in place. If you need assistance in completing these, please reach out to us  so that we can help you!  See attachments for Preventive Care and Fall Prevention Tips.

## 2023-10-20 NOTE — Progress Notes (Signed)
 Subjective:   [name] White is a 58 y.o. who presents for a Medicare Wellness preventive visit.  As a reminder, Annual Wellness Visits don't include a physical exam, and some assessments may be limited, especially if this visit is performed virtually. We may recommend an in-person follow-up visit with your provider if needed.  Visit Complete: Virtual I connected with  [name] White on 10/20/23 by a audio enabled telemedicine application and verified that I am speaking with the correct person using two identifiers.  Patient Location: Home  Provider Location: Office/Clinic  I discussed the limitations of evaluation and management by telemedicine. The patient expressed understanding and agreed to proceed.  Vital Signs: Because this visit was a virtual/telehealth visit, some criteria may be missing or patient reported. Any vitals not documented were not able to be obtained and vitals that have been documented are patient reported.  VideoDeclined- This patient declined Librarian, academic. Therefore the visit was completed with audio only.  Persons Participating in Visit: Patient.  AWV Questionnaire: No: Patient Medicare AWV questionnaire was not completed prior to this visit.  Cardiac Risk Factors include: advanced age (>57men, >30 women);dyslipidemia;diabetes mellitus;obesity (BMI >30kg/m2);smoking/ tobacco exposure;sedentary lifestyle     Objective:    Today's Vitals   10/20/23 1319  Weight: 221 lb (100.2 kg)  Height: 5' 5 (1.651 m)   Body mass index is 36.78 kg/m.     10/20/2023    1:29 PM 01/24/2023    2:16 PM 10/14/2022    1:10 PM 09/27/2022    9:15 AM 06/17/2022   11:06 AM 03/18/2022    8:23 AM 03/10/2022   12:49 PM  Advanced Directives  Does Patient Have a Medical Advance Directive? Yes No No No Yes Yes Yes  Type of Estate agent of [place];Living will        Does patient want to make changes to medical advance  directive?      No - Patient declined   Copy of Healthcare Power of Attorney in Chart? No - copy requested        Would patient like information on creating a medical advance directive? No - Patient declined  No - Patient declined No - Patient declined       Current Medications (verified) Outpatient Encounter Medications as of 10/20/2023  Medication Sig   acetaminophen  (TYLENOL ) 500 MG tablet Take 2,000 mg by mouth daily as needed for moderate pain.   DULoxetine  (CYMBALTA ) 60 MG capsule Take 1 capsule (60 mg total) by mouth at bedtime.   furosemide  (LASIX ) 20 MG tablet TAKE 1 TABLET BY MOUTH DAILY AS NEEDED FOR FLUID.   lamoTRIgine  (LAMICTAL ) 200 MG tablet Take 1 tablet (200 mg total) by mouth at bedtime.   nitroGLYCERIN  (NITROSTAT ) 0.4 MG SL tablet Place 1 tablet (0.4 mg total) under the tongue every 5 (five) minutes as needed for chest pain.   pantoprazole  (PROTONIX ) 40 MG tablet TAKE 1 TABLET BY MOUTH EVERY DAY   pravastatin  (PRAVACHOL ) 40 MG tablet TAKE 1 TABLET BY MOUTH EVERY DAY   risperiDONE  (RISPERDAL ) 1 MG tablet Take 1 tablet (1 mg total) by mouth at bedtime.   rizatriptan  (MAXALT -MLT) 10 MG disintegrating tablet Take 1 tablet (10 mg total) by mouth as needed for migraine. May repeat in 2 hours if needed.  Maximum 2 tablets in 24 hours   rOPINIRole  (REQUIP ) 1 MG tablet TAKE 2 TABLETS BY MOUTH AT BEDTIME   Semaglutide , 1 MG/DOSE, 4 MG/3ML SOPN Inject 1  mg as directed once a week.   topiramate  (TOPAMAX ) 200 MG tablet TAKE 1 TABLET BY MOUTH EVERYDAY AT BEDTIME   traZODone  (DESYREL ) 150 MG tablet Take 1 tablet (150 mg total) by mouth at bedtime.   [DISCONTINUED] HYDROmorphone  (DILAUDID ) 2 MG tablet Take 1 tablet (2 mg total) by mouth 5 (five) times daily as needed for pain   HYDROmorphone  (DILAUDID ) 2 MG tablet Take 1 tablet (2 mg total) by mouth 5 (five) times daily as needed for pain   No facility-administered encounter medications on file as of 10/20/2023.    Allergies  (verified) Morphine, Penicillins, and Clarithromycin    History: Past Medical History:  Diagnosis Date   Anterolisthesis    Arthritis    Barrett's esophagus    egd 2007 + 2009 but none 2011 or 08/2010 egd   Bipolar 1 disorder (HCC)    CAD (coronary artery disease) 02/03/2021   a.) cCTA 02/03/2021: Ca score = 1 (72nd percentile for age/sex match control)   Chronic, continuous use of opioids    Colon polyp, hyperplastic    Depression    Diastolic dysfunction 02/06/2021   a.) TTE 02/06/2021: EF 55-60%, mild LVH, G1DD   DOE (dyspnea on exertion)    Family history of adverse reaction to anesthesia    a.) delayed emergence in 1st degree relative (brother)   Gastroparesis    Generalized anxiety disorder with panic attacks    GERD (gastroesophageal reflux disease)    Grief reaction    Hidradenitis suppurativa    History of kidney stones    HLD (hyperlipidemia)    IBS (irritable bowel syndrome)    Migraine    Morbid obesity (HCC)    OSA (obstructive sleep apnea)    a.) did not tolerate nocturnal PAP therapy mainly due to anxiety and associated xerostomia   Ovarian cyst    Palpitations    Plantar fasciitis    PTSD (post-traumatic stress disorder)    Restless leg    a.) on ropinirole    Suicide attempt (HCC) 2003   Tobacco abuse    Type 2 diabetes mellitus treated without insulin (HCC)    Past Surgical History:  Procedure Laterality Date   ABDOMINAL HYSTERECTOMY     secondary to endometriosis   BACK SURGERY Right 2019   lumbar ablation L3-S1 x 2    BIOPSY  05/07/2021   Procedure: BIOPSY;  Surgeon: Wilhelmenia Aloha Raddle., MD;  Location: [facility] ENDOSCOPY;  Service: Gastroenterology;;   CESAREAN SECTION     x 2   CHOLECYSTECTOMY     COLONOSCOPY W/ POLYPECTOMY  2012   COLONOSCOPY WITH PROPOFOL  N/A 05/01/2020   Procedure: COLONOSCOPY WITH PROPOFOL ;  Surgeon: Wilhelmenia Aloha Raddle., MD;  Location: [facility] ENDOSCOPY;  Service: Gastroenterology;  Laterality: N/A;   COLONOSCOPY WITH  PROPOFOL  N/A 05/07/2021   Procedure: COLONOSCOPY WITH PROPOFOL ;  Surgeon: Mansouraty, Aloha Raddle., MD;  Location: [facility] ENDOSCOPY;  Service: Gastroenterology;  Laterality: N/A;   COLONOSCOPY WITH PROPOFOL  N/A 09/27/2022   Procedure: COLONOSCOPY WITH PROPOFOL ;  Surgeon: Mansouraty, Aloha Raddle., MD;  Location: [facility] ENDOSCOPY;  Service: Gastroenterology;  Laterality: N/A;   ELBOW SURGERY Left    ENDOSCOPIC MUCOSAL RESECTION N/A 05/01/2020   Procedure: ENDOSCOPIC MUCOSAL RESECTION;  Surgeon: Wilhelmenia Aloha Raddle., MD;  Location: [facility] ENDOSCOPY;  Service: Gastroenterology;  Laterality: N/A;   ENDOSCOPIC MUCOSAL RESECTION N/A 05/07/2021   Procedure: ENDOSCOPIC MUCOSAL RESECTION;  Surgeon: Wilhelmenia Aloha Raddle., MD;  Location: [facility] ENDOSCOPY;  Service: Gastroenterology;  Laterality: N/A;   GANGLION CYST EXCISION Right  HEMOSTASIS CLIP PLACEMENT  05/01/2020   Procedure: HEMOSTASIS CLIP PLACEMENT;  Surgeon: Wilhelmenia Aloha Raddle., MD;  Location: [facility] ENDOSCOPY;  Service: Gastroenterology;;   HEMOSTASIS CLIP PLACEMENT  05/07/2021   Procedure: HEMOSTASIS CLIP PLACEMENT;  Surgeon: Wilhelmenia Aloha Raddle., MD;  Location: [facility] ENDOSCOPY;  Service: Gastroenterology;;   HOT HEMOSTASIS N/A 05/07/2021   Procedure: HOT HEMOSTASIS (ARGON PLASMA COAGULATION/BICAP);  Surgeon: Wilhelmenia Aloha Raddle., MD;  Location: [facility] ENDOSCOPY;  Service: Gastroenterology;  Laterality: N/A;   LAPAROSCOPIC LYSIS OF ADHESIONS  04/22/2010   LAPAROSCOPY  04/30/2010   with mini laparotomy and cystotomy repair   PLANTAR FASCIA SURGERY Right    POLYPECTOMY  05/01/2020   Procedure: POLYPECTOMY;  Surgeon: Mansouraty, Aloha Raddle., MD;  Location: [facility] ENDOSCOPY;  Service: Gastroenterology;;   POLYPECTOMY  05/07/2021   Procedure: POLYPECTOMY;  Surgeon: Wilhelmenia Aloha Raddle., MD;  Location: [facility] ENDOSCOPY;  Service: Gastroenterology;;   POLYPECTOMY  09/27/2022   Procedure: POLYPECTOMY;  Surgeon: Wilhelmenia Aloha Raddle., MD;  Location: [facility] ENDOSCOPY;   Service: Gastroenterology;;   SALPINGECTOMY Right 04/22/2010   SHOULDER ARTHROSCOPY WITH SUBACROMIAL DECOMPRESSION, ROTATOR CUFF REPAIR AND BICEP TENDON REPAIR Right 03/18/2022   Procedure: SHOULDER ARTHROSCOPY WITH DEBRIDEMENT, DECOMPRESSION, ROTATOR CUFF REPAIR AND BICEPS TENOLYSIS;  Surgeon: Edie Norleen PARAS, MD;  Location: [facility] ORS;  Service: Orthopedics;  Laterality: Right;   SHOULDER OPEN ROTATOR CUFF REPAIR Right 03/18/2022   Procedure: OPEN ROTATOR CUFF REPAIR;  Surgeon: Edie Norleen PARAS, MD;  Location: [facility] ORS;  Service: Orthopedics;  Laterality: Right;   SUBMUCOSAL LIFTING INJECTION  05/01/2020   Procedure: SUBMUCOSAL LIFTING INJECTION;  Surgeon: Wilhelmenia Aloha Raddle., MD;  Location: [facility] ENDOSCOPY;  Service: Gastroenterology;;   SUBMUCOSAL LIFTING INJECTION  05/07/2021   Procedure: SUBMUCOSAL LIFTING INJECTION;  Surgeon: Wilhelmenia Aloha Raddle., MD;  Location: [facility] ENDOSCOPY;  Service: Gastroenterology;;   TOTAL HIP ARTHROPLASTY Right 04/25/2017   Procedure: RIGHT TOTAL HIP ARTHROPLASTY ANTERIOR APPROACH;  Surgeon: Fidel Rogue, MD;  Location: [facility] OR;  Service: Orthopedics;  Laterality: Right;  Needs RNFA   TUBAL LIGATION     Family History  Problem Relation Age of Onset   COPD Mother    Stroke Mother    Coronary artery disease Mother        pacer   Colon polyps Mother    Breast cancer Mother    Migraines Mother    Other Mother        RSDS   Rheum arthritis Mother    Hypertension Father    Colon polyps Brother    Colon cancer Maternal Grandfather    Colon polyps Maternal Grandfather    Breast cancer Maternal Aunt    Uterine cancer Cousin        x 2   Aneurysm Maternal Grandmother    Kidney disease Brother    Other Brother        back pain   Kidney cancer Paternal Aunt    Breast cancer Cousin    Stomach cancer Neg Hx    Pancreatic cancer Neg Hx    Esophageal cancer Neg Hx    Liver disease Neg Hx    Inflammatory bowel disease Neg Hx    Social History   Socioeconomic  History   Marital status: Divorced    Spouse name: Not on file   Number of children: 2   Years of education: Not on file   Highest education level: 12th grade  Occupational History   Occupation: Leisure centre manager  Tobacco Use   Smoking status: Every Day  Current packs/day: 0.00    Average packs/day: 1 pack/day for 40.4 years (40.4 ttl pk-yrs)    Types: Cigarettes    Start date: 10/16/1980    Last attempt to quit: 2023    Years since quitting: 2.6   Smokeless tobacco: Never   Tobacco comments:    Has smoked off and on since the age of 64, quit while pregnant twice and also quit in 2017 but restarted in 2022  Vaping Use   Vaping status: Never Used  Substance and Sexual Activity   Alcohol  use: Not Currently    Alcohol /week: 6.0 standard drinks of alcohol     Types: 6 Standard drinks or equivalent per week   Drug use: No   Sexual activity: Yes    Birth control/protection: Surgical    Comment: Hysterectomy  Other Topics Concern   Not on file  Social History Narrative   Occupation: bartender   Divorced  -- currently single   Alcohol  use-yes -x 5 per weekend   No longer a smoker   Social Drivers of Corporate investment banker Strain: Medium Risk (10/20/2023)   Overall Financial Resource Strain (CARDIA)    Difficulty of Paying Living Expenses: Somewhat hard  Food Insecurity: Food Insecurity Present (10/20/2023)   Hunger Vital Sign    Worried About Running Out of Food in the Last Year: Often true    Ran Out of Food in the Last Year: Often true  Transportation Needs: No Transportation Needs (10/20/2023)   PRAPARE - Administrator, Civil Service (Medical): No    Lack of Transportation (Non-Medical): No  Physical Activity: Insufficiently Active (10/20/2023)   Exercise Vital Sign    Days of Exercise per Week: 7 days    Minutes of Exercise per Session: 10 min  Stress: Stress Concern Present (10/20/2023)   [employer] of Occupational Health - Occupational Stress  Questionnaire    Feeling of Stress: Rather much  Social Connections: Socially Isolated (10/20/2023)   Social Connection and Isolation Panel    Frequency of Communication with Friends and Family: Three times a week    Frequency of Social Gatherings with Friends and Family: Once a week    Attends Religious Services: Never    Database administrator or Organizations: No    Attends Engineer, structural: Never    Marital Status: Divorced    Tobacco Counseling Ready to quit: Not Answered Counseling given: Not Answered Tobacco comments: Has smoked off and on since the age of 30, quit while pregnant twice and also quit in 2017 but restarted in 2022    Clinical Intake:  Pre-visit preparation completed: Yes  Pain : No/denies pain     BMI - recorded: 36.78 Nutritional Status: BMI > 30  Obese Nutritional Risks: None Diabetes: Yes CBG done?: No Did pt. bring in CBG monitor from home?: No  Lab Results  Component Value Date   HGBA1C 6.0 10/12/2023   HGBA1C 6.1 04/05/2023   HGBA1C 5.8 06/10/2022     How often do you need to have someone help you when you read instructions, pamphlets, or other written materials from your doctor or pharmacy?: 1 - Never  Interpreter Needed?: No  Information entered by :: Ellouise Haws, LPN   Activities of Daily Living     10/20/2023    1:22 PM  In your present state of health, do you have any difficulty performing the following activities:  Hearing? 0  Vision? 0  Difficulty concentrating or making decisions?  0  Walking or climbing stairs? 0  Dressing or bathing? 0  Doing errands, shopping? 0  Preparing Food and eating ? N  Using the Toilet? N  In the past six months, have you accidently leaked urine? Y  Comment wears a brief  Do you have problems with loss of bowel control? Y  Comment wears a brief  Managing your Medications? N  Managing your Finances? N  Housekeeping or managing your Housekeeping? N    Patient Care  Team: Job Lukes, [place] as PCP - General (Physician Assistant) Shlomo Wilbert SAUNDERS, MD as PCP - Cardiology (Cardiology) Obie Princella HERO, MD (Inactive) as Consulting Physician (Gastroenterology) Rosalynn LELON Ingle, MD (Inactive) as Consulting Physician (Obstetrics and Gynecology) Orlando Anes, MD (Anesthesiology) Fidel Rogue, MD as Consulting Physician (Orthopedic Surgery)  I have updated your Care Teams any recent Medical Services you may have received from other providers in the past year.     Assessment:   This is a routine wellness examination for [place].  Hearing/Vision screen Hearing Screening - Comments:: Pt denies any hearing issues  Vision Screening - Comments:: Wears rx glasses - up to date with routine eye exams with Dr Maude Bring    Goals Addressed   None    Depression Screen     10/20/2023    1:23 PM 10/12/2023   10:30 AM 04/14/2023   10:30 AM 04/05/2023    2:32 PM 10/14/2022    1:07 PM 10/05/2021    1:14 PM 12/02/2020   10:39 AM  PHQ 2/9 Scores  PHQ - 2 Score 4 4  4 2 3  0  PHQ- 9 Score 10 12  13 4 12       Information is confidential and restricted. Go to Review Flowsheets to unlock data.    Fall Risk     10/20/2023    1:29 PM 01/24/2023    2:16 PM 10/14/2022    1:11 PM 01/07/2022   10:07 AM 10/05/2021    1:20 PM  Fall Risk   Falls in the past year? 1 1 1 1 1   Number falls in past yr: 1 0 1 1 1   Injury with Fall? 1 0 1 0 1  Comment back  back  back hurt more  Risk for fall due to : History of fall(s);Impaired balance/gait;Impaired mobility  Impaired balance/gait;Impaired mobility;Impaired vision;History of fall(s)  Impaired vision;Impaired balance/gait  Follow up Falls prevention discussed Falls evaluation completed Falls prevention discussed Falls evaluation completed  Falls prevention discussed      Data saved with a previous flowsheet row definition    MEDICARE RISK AT HOME:  Medicare Risk at Home Any stairs in or around the home?: No If so, are there  any without handrails?: No Home free of loose throw rugs in walkways, pet beds, electrical cords, etc?: Yes Adequate lighting in your home to reduce risk of falls?: Yes Life alert?: No Use of a cane, walker or w/c?: No Grab bars in the bathroom?: Yes Shower chair or bench in shower?: Yes Elevated toilet seat or a handicapped toilet?: No  TIMED UP AND GO:  Was the test performed?  No  Cognitive Function: 6CIT completed        10/20/2023    1:30 PM 10/14/2022    1:12 PM 10/05/2021    1:23 PM  6CIT Screen  What Year? 0 points 0 points 0 points  What month? 0 points 0 points 0 points  What time? 0 points 0 points 0 points  Count back from 20 0 points 0 points 0 points  Months in reverse 0 points 0 points 0 points  Repeat phrase 0 points 0 points 0 points  Total Score 0 points 0 points 0 points    Immunizations Immunization History  Administered Date(s) Administered   Influenza Split 11/24/2011   Influenza Whole 11/20/2009   Influenza, Seasonal, Injecte, Preservative Fre 01/17/2023   Influenza,inj,Quad PF,6+ Mos 01/28/2014, 12/12/2017, 02/28/2019, 11/28/2019, 12/02/2020, 10/12/2021   PNEUMOCOCCAL CONJUGATE-20 10/12/2023   Pneumococcal Conjugate-13 01/28/2014   Td 11/20/2009   Td (Adult), 2 Lf Tetanus Toxid, Preservative Free 11/20/2009   Tdap 11/28/2019   Zoster Recombinant(Shingrix) 08/18/2021, 12/31/2021    Screening Tests Health Maintenance  Topic Date Due   Lung Cancer Screening  11/04/2022   INFLUENZA VACCINE  05/22/2024 (Originally 09/23/2023)   Hepatitis B Vaccines 19-59 Average Risk (1 of 3 - 19+ 3-dose series) 10/11/2024 (Originally 10[birth year]17/1983)   OPHTHALMOLOGY EXAM  03/22/2024   FOOT EXAM  04/04/2024   HEMOGLOBIN A1C  04/13/2024   Diabetic kidney evaluation - eGFR measurement  10/11/2024   Diabetic kidney evaluation - Urine ACR  10/11/2024   Medicare Annual Wellness (AWV)  10/19/2024   MAMMOGRAM  11/25/2024   DTaP/Tdap/Td (4 - Td or Tdap) 11/27/2029    Colonoscopy  09/26/2032   Pneumococcal Vaccine: 50+ Years  Completed   Hepatitis C Screening  Completed   HIV Screening  Completed   Zoster Vaccines- Shingrix  Completed   HPV VACCINES  Aged Out   Meningococcal B Vaccine  Aged Out   COVID-19 Vaccine  Discontinued    Health Maintenance  Health Maintenance Due  Topic Date Due   Lung Cancer Screening  11/04/2022   Health Maintenance Items Addressed: Referral sent to [facility] (smoker/hx smoking)  Additional Screening:  Vision Screening: Recommended annual ophthalmology exams for early detection of glaucoma and other disorders of the eye. Would you like a referral to an eye doctor? No    Dental Screening: Recommended annual dental exams for proper oral hygiene  Community Resource Referral / Chronic Care Management: CRR required this visit?  No   CCM required this visit?  No   Plan:    I have personally reviewed and noted the following in the patient's chart:   Medical and social history Use of alcohol , tobacco or illicit drugs  Current medications and supplements including opioid prescriptions. Patient is currently taking opioid prescriptions. Information provided to patient regarding non-opioid alternatives. Patient advised to discuss non-opioid treatment plan with their provider. Functional ability and status Nutritional status Physical activity Advanced directives List of other physicians Hospitalizations, surgeries, and ER visits in previous 12 months Vitals Screenings to include cognitive, depression, and falls Referrals and appointments  In addition, I have reviewed and discussed with patient certain preventive protocols, quality metrics, and best practice recommendations. A written personalized care plan for preventive services as well as general preventive health recommendations were provided to patient.   Ellouise VEAR Haws, LPN   1/[id number]/7974   After Visit Summary: (MyChart) Due to this being a  telephonic visit, the after visit summary with patients personalized plan was offered to patient via MyChart   Notes: Nothing significant to report at this time.

## 2023-10-31 ENCOUNTER — Encounter: Payer: Self-pay | Admitting: *Deleted

## 2023-10-31 ENCOUNTER — Ambulatory Visit: Admitting: *Deleted

## 2023-10-31 ENCOUNTER — Encounter: Payer: Self-pay | Admitting: Orthopedic Surgery

## 2023-10-31 DIAGNOSIS — M47816 Spondylosis without myelopathy or radiculopathy, lumbar region: Secondary | ICD-10-CM | POA: Diagnosis not present

## 2023-10-31 DIAGNOSIS — F17210 Nicotine dependence, cigarettes, uncomplicated: Secondary | ICD-10-CM | POA: Diagnosis not present

## 2023-10-31 NOTE — Telephone Encounter (Signed)
 Lumbar xrays dated 10/17/23:  FINDINGS: Osteopenia. There are five non-rib bearing lumbar-type vertebral bodies. Mild levocurvature of the lumbar spine. There is grade 1 anterolisthesis of L4-5 measuring approximately 5 mm without evidence of dynamic instability. There is trace retrolisthesis of L5-S1. this is likely due to facet arthropathy. There is no evidence for acute fracture or subluxation. Mild intervertebral disc space height loss at L4-5 and L5-S1. lower lumbar facet arthropathy. Status post RIGHT hip arthroplasty. Sacrum is obscured by overlapping bowel contents. Status post cholecystectomy. Query asymmetric sclerosis along the LEFT sacroiliac joint, possibly due to superimposed structures versus asymmetric degenerative change.   IMPRESSION: 1. Grade 1 anterolisthesis of L4-5 without evidence of dynamic instability. 2. Degenerative changes of the lower lumbar spine with predominately facet arthropathy. 3. Possible asymmetric LEFT-sided sacroiliac joint degenerative changes.     Electronically Signed   By: Corean Salter M.D.   On: 10/29/2023 12:06  I have personally reviewed the images and agree with the above interpretation.  Message sent to patient regarding xray results.

## 2023-10-31 NOTE — Patient Instructions (Signed)

## 2023-10-31 NOTE — Progress Notes (Signed)
  Virtual Visit via Telephone Note  I connected with Keyna D Kondo on 10/31/23 at  9:30 AM EDT by telephone and verified that I am speaking with the correct person using two identifiers.  Location: Patient: [name] White Provider: Laneta Speaks, RN   I discussed the limitations, risks, security and privacy concerns of performing an evaluation and management service by telephone and the availability of in person appointments. I also discussed with the patient that there may be a patient responsible charge related to this service. The patient expressed understanding and agreed to proceed.   Shared Decision Making Visit Lung Cancer Screening Program [phone])   Eligibility: Age 14 y.o. Pack Years Smoking History Calculation 75 (# packs/per year x # years smoked) Recent History of coughing up blood  no Unexplained weight loss? no ( >Than 15 pounds within the last 6 months ) Prior History Lung / other cancer no (Diagnosis within the last 5 years already requiring surveillance chest CT Scans). Smoking Status Current Smoker Former Smokers: Years since quit: n/a  Quit Date: n/a  Visit Components: Discussion included one or more decision making aids. yes Discussion included risk/benefits of screening. yes Discussion included potential follow up diagnostic testing for abnormal scans. yes Discussion included meaning and risk of over diagnosis. yes Discussion included meaning and risk of False Positives. yes Discussion included meaning of total radiation exposure. yes  Counseling Included: Importance of adherence to annual lung cancer LDCT screening. yes Impact of comorbidities on ability to participate in the program. yes Ability and willingness to under diagnostic treatment. yes  Smoking Cessation Counseling: Current Smokers:  Discussed importance of smoking cessation. yes Information about tobacco cessation classes and interventions provided to patient. yes Patient provided  with ticket for LDCT Scan. no Symptomatic Patient. no  Counseling(Intermediate counseling: > three minutes) [id number] Diagnosis Code: Tobacco Use Z72.0 Asymptomatic Patient yes  Counseled patient 4 minutes regarding tobacco use.   Former Smokers:  Discussed the importance of maintaining cigarette abstinence. yes Diagnosis Code: Personal History of Nicotine  Dependence. S12.108 Information about tobacco cessation classes and interventions provided to patient. Yes Patient provided with ticket for LDCT Scan. no Written Order for Lung Cancer Screening with LDCT placed in Epic. Yes (CT Chest Lung Cancer Screening Low Dose W/O CM) [id number] Z12.2-Screening of respiratory organs Z87.891-Personal history of nicotine  dependence   Laneta Speaks, RN

## 2023-11-02 ENCOUNTER — Ambulatory Visit
Admission: RE | Admit: 2023-11-02 | Discharge: 2023-11-02 | Disposition: A | Discharge status: Home / Self Care | Source: Ambulatory Visit | Attending: Physician Assistant | Admitting: Physician Assistant

## 2023-11-02 DIAGNOSIS — F17210 Nicotine dependence, cigarettes, uncomplicated: Secondary | ICD-10-CM | POA: Diagnosis not present

## 2023-11-02 DIAGNOSIS — Z87891 Personal history of nicotine dependence: Secondary | ICD-10-CM

## 2023-11-02 DIAGNOSIS — Z122 Encounter for screening for malignant neoplasm of respiratory organs: Secondary | ICD-10-CM

## 2023-11-08 ENCOUNTER — Other Ambulatory Visit (HOSPITAL_COMMUNITY): Payer: Self-pay

## 2023-11-08 DIAGNOSIS — G894 Chronic pain syndrome: Secondary | ICD-10-CM | POA: Diagnosis not present

## 2023-11-08 DIAGNOSIS — M47816 Spondylosis without myelopathy or radiculopathy, lumbar region: Secondary | ICD-10-CM | POA: Diagnosis not present

## 2023-11-08 DIAGNOSIS — M5416 Radiculopathy, lumbar region: Secondary | ICD-10-CM | POA: Diagnosis not present

## 2023-11-08 DIAGNOSIS — M7061 Trochanteric bursitis, right hip: Secondary | ICD-10-CM | POA: Diagnosis not present

## 2023-11-08 DIAGNOSIS — Z79891 Long term (current) use of opiate analgesic: Secondary | ICD-10-CM | POA: Diagnosis not present

## 2023-11-08 MED ORDER — HYDROMORPHONE HCL 2 MG PO TABS
2.0000 mg | ORAL_TABLET | Freq: Every day | ORAL | 0 refills | Status: DC | PRN
  Filled 2023-11-16: qty 150, 30d supply, fill #0

## 2023-11-09 ENCOUNTER — Other Ambulatory Visit (HOSPITAL_COMMUNITY): Payer: Self-pay

## 2023-11-09 NOTE — Progress Notes (Signed)
 Virtual Visit via Telephone Note   I connected with [name] White on 10/31/23 at  9:30 AM EDT by telephone and verified that I am speaking with the correct person using two identifiers.   Location: Patient: at home Provider: [address] [place], [place], [address]    I discussed the limitations, risks, security and privacy concerns of performing an evaluation and management service by telephone and the availability of in person appointments. I also discussed with the patient that there may be a patient responsible charge related to this service. The patient expressed understanding and agreed to proceed.

## 2023-11-11 ENCOUNTER — Telehealth: Payer: Self-pay

## 2023-11-11 NOTE — Telephone Encounter (Signed)
 Copied from CRM [id number]. Topic: Clinical - Lab/Test Results >> Nov 11, 2023 12:06 PM Whitney O wrote: Reason for CRM: patient is calling because she has not heard anything about her ct scan results did relay the timeframe please give patient a call once results are in  [phone]

## 2023-11-12 ENCOUNTER — Other Ambulatory Visit: Payer: Self-pay | Admitting: Physician Assistant

## 2023-11-14 ENCOUNTER — Other Ambulatory Visit: Payer: Self-pay

## 2023-11-14 ENCOUNTER — Other Ambulatory Visit (HOSPITAL_COMMUNITY): Payer: Self-pay

## 2023-11-14 DIAGNOSIS — Z122 Encounter for screening for malignant neoplasm of respiratory organs: Secondary | ICD-10-CM

## 2023-11-14 DIAGNOSIS — Z87891 Personal history of nicotine dependence: Secondary | ICD-10-CM

## 2023-11-14 DIAGNOSIS — F17210 Nicotine dependence, cigarettes, uncomplicated: Secondary | ICD-10-CM

## 2023-11-14 NOTE — Telephone Encounter (Signed)
 Result letter sent 11/14/2023

## 2023-11-15 ENCOUNTER — Encounter: Payer: Self-pay | Admitting: Physician Assistant

## 2023-11-16 ENCOUNTER — Other Ambulatory Visit (HOSPITAL_COMMUNITY): Payer: Self-pay

## 2023-11-17 ENCOUNTER — Other Ambulatory Visit (HOSPITAL_COMMUNITY)
Admission: RE | Admit: 2023-11-17 | Discharge: 2023-11-17 | Disposition: A | Discharge status: Home / Self Care | Payer: Self-pay | Source: Ambulatory Visit | Attending: Medical Genetics | Admitting: Medical Genetics

## 2023-11-17 DIAGNOSIS — Z006 Encounter for examination for normal comparison and control in clinical research program: Secondary | ICD-10-CM | POA: Insufficient documentation

## 2023-11-25 LAB — GENECONNECT MOLECULAR SCREEN: Genetic Analysis Overall Interpretation: NEGATIVE

## 2023-11-30 DIAGNOSIS — M47816 Spondylosis without myelopathy or radiculopathy, lumbar region: Secondary | ICD-10-CM | POA: Diagnosis not present

## 2023-12-05 ENCOUNTER — Other Ambulatory Visit: Payer: Self-pay | Admitting: Physician Assistant

## 2023-12-05 DIAGNOSIS — M75111 Incomplete rotator cuff tear or rupture of right shoulder, not specified as traumatic: Secondary | ICD-10-CM | POA: Diagnosis not present

## 2023-12-05 DIAGNOSIS — M7581 Other shoulder lesions, right shoulder: Secondary | ICD-10-CM | POA: Diagnosis not present

## 2023-12-05 DIAGNOSIS — M7551 Bursitis of right shoulder: Secondary | ICD-10-CM | POA: Diagnosis not present

## 2023-12-07 ENCOUNTER — Other Ambulatory Visit: Payer: Self-pay | Admitting: Surgery

## 2023-12-07 DIAGNOSIS — M75111 Incomplete rotator cuff tear or rupture of right shoulder, not specified as traumatic: Secondary | ICD-10-CM

## 2023-12-07 DIAGNOSIS — M7551 Bursitis of right shoulder: Secondary | ICD-10-CM

## 2023-12-07 DIAGNOSIS — M7581 Other shoulder lesions, right shoulder: Secondary | ICD-10-CM

## 2023-12-08 ENCOUNTER — Other Ambulatory Visit: Payer: Self-pay | Admitting: Neurology

## 2023-12-11 ENCOUNTER — Ambulatory Visit
Admission: RE | Admit: 2023-12-11 | Discharge: 2023-12-11 | Disposition: A | Discharge status: Home / Self Care | Source: Ambulatory Visit | Attending: Surgery | Admitting: Surgery

## 2023-12-11 DIAGNOSIS — M75111 Incomplete rotator cuff tear or rupture of right shoulder, not specified as traumatic: Secondary | ICD-10-CM | POA: Diagnosis not present

## 2023-12-11 DIAGNOSIS — S46811A Strain of other muscles, fascia and tendons at shoulder and upper arm level, right arm, initial encounter: Secondary | ICD-10-CM | POA: Diagnosis not present

## 2023-12-11 DIAGNOSIS — M7551 Bursitis of right shoulder: Secondary | ICD-10-CM | POA: Insufficient documentation

## 2023-12-11 DIAGNOSIS — M75121 Complete rotator cuff tear or rupture of right shoulder, not specified as traumatic: Secondary | ICD-10-CM | POA: Diagnosis not present

## 2023-12-11 DIAGNOSIS — M7581 Other shoulder lesions, right shoulder: Secondary | ICD-10-CM | POA: Insufficient documentation

## 2023-12-11 DIAGNOSIS — M19011 Primary osteoarthritis, right shoulder: Secondary | ICD-10-CM | POA: Diagnosis not present

## 2023-12-12 ENCOUNTER — Ambulatory Visit: Admitting: Orthopedic Surgery

## 2023-12-15 ENCOUNTER — Other Ambulatory Visit (HOSPITAL_COMMUNITY): Payer: Self-pay

## 2023-12-15 DIAGNOSIS — G894 Chronic pain syndrome: Secondary | ICD-10-CM | POA: Diagnosis not present

## 2023-12-15 DIAGNOSIS — M5416 Radiculopathy, lumbar region: Secondary | ICD-10-CM | POA: Diagnosis not present

## 2023-12-15 DIAGNOSIS — M7061 Trochanteric bursitis, right hip: Secondary | ICD-10-CM | POA: Diagnosis not present

## 2023-12-15 DIAGNOSIS — M47816 Spondylosis without myelopathy or radiculopathy, lumbar region: Secondary | ICD-10-CM | POA: Diagnosis not present

## 2023-12-15 MED ORDER — HYDROMORPHONE HCL 2 MG PO TABS
2.0000 mg | ORAL_TABLET | Freq: Every day | ORAL | 0 refills | Status: DC | PRN
  Filled 2023-12-15: qty 150, 30d supply, fill #0

## 2023-12-19 ENCOUNTER — Encounter: Payer: Self-pay | Admitting: Physician Assistant

## 2023-12-31 DIAGNOSIS — M47816 Spondylosis without myelopathy or radiculopathy, lumbar region: Secondary | ICD-10-CM | POA: Diagnosis not present

## 2024-01-10 ENCOUNTER — Encounter (HOSPITAL_COMMUNITY): Payer: Self-pay | Admitting: Psychiatry

## 2024-01-10 ENCOUNTER — Telehealth (HOSPITAL_COMMUNITY): Admitting: Psychiatry

## 2024-01-10 VITALS — Wt 215.0 lb

## 2024-01-10 DIAGNOSIS — F319 Bipolar disorder, unspecified: Secondary | ICD-10-CM | POA: Diagnosis not present

## 2024-01-10 DIAGNOSIS — F4310 Post-traumatic stress disorder, unspecified: Secondary | ICD-10-CM | POA: Diagnosis not present

## 2024-01-10 DIAGNOSIS — F419 Anxiety disorder, unspecified: Secondary | ICD-10-CM

## 2024-01-10 MED ORDER — TRAZODONE HCL 150 MG PO TABS: 150.0000 mg | ORAL_TABLET | Freq: Every day | ORAL | 2 refills | Status: AC

## 2024-01-10 MED ORDER — RISPERIDONE 1 MG PO TABS
1.0000 mg | ORAL_TABLET | Freq: Every day | ORAL | 2 refills | Status: AC
Start: 2024-01-10 — End: 2024-04-09

## 2024-01-10 MED ORDER — DULOXETINE HCL 60 MG PO CPEP
60.0000 mg | ORAL_CAPSULE | Freq: Every day | ORAL | 2 refills | Status: AC
Start: 2024-01-10 — End: 2024-04-09

## 2024-01-10 MED ORDER — LAMOTRIGINE 200 MG PO TABS: 200.0000 mg | ORAL_TABLET | Freq: Every day | ORAL | 2 refills | Status: AC
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
## 2024-01-12 ENCOUNTER — Ambulatory Visit: Payer: Self-pay | Admitting: Physician Assistant

## 2024-01-12 ENCOUNTER — Encounter: Payer: Self-pay | Admitting: Physician Assistant

## 2024-01-12 ENCOUNTER — Ambulatory Visit: Admitting: Physician Assistant

## 2024-01-12 VITALS — BP 122/70 | HR 73 | Temp 98.0°F | Ht 65.0 in | Wt 216.0 lb

## 2024-01-12 DIAGNOSIS — E785 Hyperlipidemia, unspecified: Secondary | ICD-10-CM | POA: Diagnosis not present

## 2024-01-12 DIAGNOSIS — Z23 Encounter for immunization: Secondary | ICD-10-CM | POA: Diagnosis not present

## 2024-01-12 DIAGNOSIS — F319 Bipolar disorder, unspecified: Secondary | ICD-10-CM | POA: Diagnosis not present

## 2024-01-12 DIAGNOSIS — K219 Gastro-esophageal reflux disease without esophagitis: Secondary | ICD-10-CM | POA: Diagnosis not present

## 2024-01-12 DIAGNOSIS — G2581 Restless legs syndrome: Secondary | ICD-10-CM

## 2024-01-12 DIAGNOSIS — Z6835 Body mass index (BMI) 35.0-35.9, adult: Secondary | ICD-10-CM | POA: Diagnosis not present

## 2024-01-12 DIAGNOSIS — M545 Low back pain, unspecified: Secondary | ICD-10-CM | POA: Diagnosis not present

## 2024-01-12 DIAGNOSIS — G8929 Other chronic pain: Secondary | ICD-10-CM

## 2024-01-12 DIAGNOSIS — Z7985 Long-term (current) use of injectable non-insulin antidiabetic drugs: Secondary | ICD-10-CM | POA: Diagnosis not present

## 2024-01-12 DIAGNOSIS — E119 Type 2 diabetes mellitus without complications: Secondary | ICD-10-CM | POA: Diagnosis not present

## 2024-01-12 DIAGNOSIS — E118 Type 2 diabetes mellitus with unspecified complications: Secondary | ICD-10-CM | POA: Insufficient documentation

## 2024-01-12 LAB — CBC WITH DIFFERENTIAL/PLATELET
Basophils Absolute: 0 K/uL (ref 0.0–0.1)
Basophils Relative: 0.6 % (ref 0.0–3.0)
Eosinophils Absolute: 0.1 K/uL (ref 0.0–0.7)
Eosinophils Relative: 1.5 % (ref 0.0–5.0)
HCT: 42.1 % (ref 36.0–46.0)
Hemoglobin: 13.9 g/dL (ref 12.0–15.0)
Lymphocytes Relative: 32.1 % (ref 12.0–46.0)
Lymphs Abs: 2.4 K/uL (ref 0.7–4.0)
MCHC: 33.1 g/dL (ref 30.0–36.0)
MCV: 98 fl (ref 78.0–100.0)
Monocytes Absolute: 0.7 K/uL (ref 0.1–1.0)
Monocytes Relative: 8.9 % (ref 3.0–12.0)
Neutro Abs: 4.3 K/uL (ref 1.4–7.7)
Neutrophils Relative %: 56.9 % (ref 43.0–77.0)
Platelets: 213 K/uL (ref 150.0–400.0)
RBC: 4.29 Mil/uL (ref 3.87–5.11)
RDW: 13.3 % (ref 11.5–15.5)
WBC: 7.6 K/uL (ref 4.0–10.5)

## 2024-01-12 LAB — COMPREHENSIVE METABOLIC PANEL WITH GFR
ALT: 14 U/L (ref 0–35)
AST: 17 U/L (ref 0–37)
Albumin: 4 g/dL (ref 3.5–5.2)
Alkaline Phosphatase: 67 U/L (ref 39–117)
BUN: 8 mg/dL (ref 6–23)
CO2: 27 meq/L (ref 19–32)
Calcium: 9.1 mg/dL (ref 8.4–10.5)
Chloride: 106 meq/L (ref 96–112)
Creatinine, Ser: 0.84 mg/dL (ref 0.40–1.20)
GFR: 76.16 mL/min (ref >60.00)
Glucose, Bld: 86 mg/dL (ref 70–99)
Potassium: 3.9 meq/L (ref 3.5–5.1)
Sodium: 138 meq/L (ref 135–145)
Total Bilirubin: 0.3 mg/dL (ref 0.2–1.2)
Total Protein: 7.1 g/dL (ref 6.0–8.3)

## 2024-01-12 LAB — LIPID PANEL
Cholesterol: 180 mg/dL (ref 0–200)
HDL: 48.6 mg/dL (ref >39.00)
LDL Cholesterol: 102 mg/dL — ABNORMAL HIGH (ref 0–99)
NonHDL: 131.07
Total CHOL/HDL Ratio: 4
Triglycerides: 144 mg/dL (ref 0.0–149.0)
VLDL: 28.8 mg/dL (ref 0.0–40.0)

## 2024-01-12 LAB — HEMOGLOBIN A1C: Hgb A1c MFr Bld: 5.7 % (ref 4.6–6.5)

## 2024-01-12 MED ORDER — SEMAGLUTIDE (2 MG/DOSE) 8 MG/3ML ~~LOC~~ SOPN: 2.0000 mg | PEN_INJECTOR | SUBCUTANEOUS | 1 refills | Status: DC

## 2024-01-12 NOTE — Progress Notes (Signed)
 [name] White is a 58 y.o. female here for a follow up of a pre-existing problem.  History of Present Illness:   Chief Complaint  Patient presents with   Diabetes    Pt here for 3 month follow up, Ozempic  increased to 1 mg weekly, tolerating well, no vomiting.    Discussed the use of AI scribe software for clinical note transcription with the patient, who gave verbal consent to proceed.  History of Present Illness   [name] White is a 58 year old female who presents for a follow-up visit regarding her upcoming surgery and medication management.  She is scheduled for surgery on December 11th and feels nervous about the procedure. She has no history of anesthesia-related problems. She anticipates a preoperative video call on December 3rd.  She is on Ozempic  1 mg weekly for weight management without side effects and has two weeks of medication remaining.   She reports that her current pain medication regimen of 2 mg of dilaudid  five times daily is ineffective, and she has been informed it cannot be adjusted. She previously used meloxicam for shoulder pain without improvement.  She takes pantoprazole  daily for gastroesophageal reflux disease , pravastatin  for cholesterol, and Requip  for restless legs, which is effective.     She continues to see her psychiatrist, Dr Syed Arfeen, for management of Bipolar Disorder 1. She is currently taking Cymbalta  60 mg daily, Lamictal  200 mg, risperidal 1 mg and trazodone  150 mg daily. Overall feels like mood and symptom(s) are well controlled and stable. Reports there is no suicidal ideation/hi.   Past Medical History:  Diagnosis Date   Anterolisthesis    Arthritis    Barrett's esophagus    egd 2007 + 2009 but none 2011 or 08/2010 egd   Bipolar 1 disorder (HCC)    CAD (coronary artery disease) 02/03/2021   a.) cCTA 02/03/2021: Ca score = 1 (72nd percentile for age/sex match control)   Chronic, continuous use of opioids    Colon polyp,  hyperplastic    Depression    Diastolic dysfunction 02/06/2021   a.) TTE 02/06/2021: EF 55-60%, mild LVH, G1DD   DOE (dyspnea on exertion)    Family history of adverse reaction to anesthesia    a.) delayed emergence in 1st degree relative (brother)   Gastroparesis    Generalized anxiety disorder with panic attacks    GERD (gastroesophageal reflux disease)    Grief reaction    Hidradenitis suppurativa    History of kidney stones    HLD (hyperlipidemia)    IBS (irritable bowel syndrome)    Migraine    Morbid obesity (HCC)    OSA (obstructive sleep apnea)    a.) did not tolerate nocturnal PAP therapy mainly due to anxiety and associated xerostomia   Ovarian cyst    Palpitations    Plantar fasciitis    PTSD (post-traumatic stress disorder)    Restless leg    a.) on ropinirole    Suicide attempt (HCC) 2003   Tobacco abuse    Type 2 diabetes mellitus treated without insulin (HCC)      Social History   Tobacco Use   Smoking status: Every Day    Current packs/day: 1.00    Average packs/day: 1.8 packs/day for 41.2 years (75.6 ttl pk-yrs)    Types: Cigarettes    Start date: 10/16/1980    Last attempt to quit: 02/2015   Smokeless tobacco: Never   Tobacco comments:    Has smoked off and  on since the age of 73, quit while pregnant twice and also quit in 2017 but restarted in 2022    10/31/23 pt reports that she is trying to cut back. Currently smoking 1 ppd.  Vaping Use   Vaping status: Never Used  Substance Use Topics   Alcohol  use: Not Currently    Alcohol /week: 6.0 standard drinks of alcohol     Types: 6 Standard drinks or equivalent per week   Drug use: No    Past Surgical History:  Procedure Laterality Date   ABDOMINAL HYSTERECTOMY     secondary to endometriosis   BACK SURGERY Right 2019   lumbar ablation L3-S1 x 2    BIOPSY  05/07/2021   Procedure: BIOPSY;  Surgeon: Wilhelmenia Aloha Raddle., MD;  Location: [facility] ENDOSCOPY;  Service: Gastroenterology;;   CESAREAN SECTION      x 2   CHOLECYSTECTOMY     COLONOSCOPY W/ POLYPECTOMY  2012   COLONOSCOPY WITH PROPOFOL  N/A 05/01/2020   Procedure: COLONOSCOPY WITH PROPOFOL ;  Surgeon: Wilhelmenia Aloha Raddle., MD;  Location: [facility] ENDOSCOPY;  Service: Gastroenterology;  Laterality: N/A;   COLONOSCOPY WITH PROPOFOL  N/A 05/07/2021   Procedure: COLONOSCOPY WITH PROPOFOL ;  Surgeon: Mansouraty, Aloha Raddle., MD;  Location: [facility] ENDOSCOPY;  Service: Gastroenterology;  Laterality: N/A;   COLONOSCOPY WITH PROPOFOL  N/A 09/27/2022   Procedure: COLONOSCOPY WITH PROPOFOL ;  Surgeon: Mansouraty, Aloha Raddle., MD;  Location: [facility] ENDOSCOPY;  Service: Gastroenterology;  Laterality: N/A;   ELBOW SURGERY Left    ENDOSCOPIC MUCOSAL RESECTION N/A 05/01/2020   Procedure: ENDOSCOPIC MUCOSAL RESECTION;  Surgeon: Wilhelmenia Aloha Raddle., MD;  Location: [facility] ENDOSCOPY;  Service: Gastroenterology;  Laterality: N/A;   ENDOSCOPIC MUCOSAL RESECTION N/A 05/07/2021   Procedure: ENDOSCOPIC MUCOSAL RESECTION;  Surgeon: Wilhelmenia Aloha Raddle., MD;  Location: [facility] ENDOSCOPY;  Service: Gastroenterology;  Laterality: N/A;   GANGLION CYST EXCISION Right    HEMOSTASIS CLIP PLACEMENT  05/01/2020   Procedure: HEMOSTASIS CLIP PLACEMENT;  Surgeon: Wilhelmenia Aloha Raddle., MD;  Location: [facility] ENDOSCOPY;  Service: Gastroenterology;;   HEMOSTASIS CLIP PLACEMENT  05/07/2021   Procedure: HEMOSTASIS CLIP PLACEMENT;  Surgeon: Wilhelmenia Aloha Raddle., MD;  Location: [facility] ENDOSCOPY;  Service: Gastroenterology;;   HOT HEMOSTASIS N/A 05/07/2021   Procedure: HOT HEMOSTASIS (ARGON PLASMA COAGULATION/BICAP);  Surgeon: Wilhelmenia Aloha Raddle., MD;  Location: [facility] ENDOSCOPY;  Service: Gastroenterology;  Laterality: N/A;   LAPAROSCOPIC LYSIS OF ADHESIONS  04/22/2010   LAPAROSCOPY  04/30/2010   with mini laparotomy and cystotomy repair   PLANTAR FASCIA SURGERY Right    POLYPECTOMY  05/01/2020   Procedure: POLYPECTOMY;  Surgeon: Mansouraty, Aloha Raddle., MD;  Location: [facility] ENDOSCOPY;  Service:  Gastroenterology;;   POLYPECTOMY  05/07/2021   Procedure: POLYPECTOMY;  Surgeon: Wilhelmenia Aloha Raddle., MD;  Location: [facility] ENDOSCOPY;  Service: Gastroenterology;;   POLYPECTOMY  09/27/2022   Procedure: POLYPECTOMY;  Surgeon: Wilhelmenia Aloha Raddle., MD;  Location: [facility] ENDOSCOPY;  Service: Gastroenterology;;   SALPINGECTOMY Right 04/22/2010   SHOULDER ARTHROSCOPY WITH SUBACROMIAL DECOMPRESSION, ROTATOR CUFF REPAIR AND BICEP TENDON REPAIR Right 03/18/2022   Procedure: SHOULDER ARTHROSCOPY WITH DEBRIDEMENT, DECOMPRESSION, ROTATOR CUFF REPAIR AND BICEPS TENOLYSIS;  Surgeon: Edie Norleen PARAS, MD;  Location: [facility] ORS;  Service: Orthopedics;  Laterality: Right;   SHOULDER OPEN ROTATOR CUFF REPAIR Right 03/18/2022   Procedure: OPEN ROTATOR CUFF REPAIR;  Surgeon: Edie Norleen PARAS, MD;  Location: [facility] ORS;  Service: Orthopedics;  Laterality: Right;   SUBMUCOSAL LIFTING INJECTION  05/01/2020   Procedure: SUBMUCOSAL LIFTING INJECTION;  Surgeon: Wilhelmenia Aloha Raddle., MD;  Location: [facility]  ENDOSCOPY;  Service: Gastroenterology;;   SUBMUCOSAL LIFTING INJECTION  05/07/2021   Procedure: SUBMUCOSAL LIFTING INJECTION;  Surgeon: Wilhelmenia Aloha Raddle., MD;  Location: [facility] ENDOSCOPY;  Service: Gastroenterology;;   TOTAL HIP ARTHROPLASTY Right 04/25/2017   Procedure: RIGHT TOTAL HIP ARTHROPLASTY ANTERIOR APPROACH;  Surgeon: Fidel Rogue, MD;  Location: [facility] OR;  Service: Orthopedics;  Laterality: Right;  Needs RNFA   TUBAL LIGATION      Family History  Problem Relation Age of Onset   COPD Mother    Stroke Mother    Coronary artery disease Mother        pacer   Colon polyps Mother    Breast cancer Mother    Migraines Mother    Other Mother        RSDS   Rheum arthritis Mother    Hypertension Father    Colon polyps Brother    Colon cancer Maternal Grandfather    Colon polyps Maternal Grandfather    Breast cancer Maternal Aunt    Uterine cancer Cousin        x 2   Aneurysm Maternal Grandmother    Kidney  disease Brother    Other Brother        back pain   Kidney cancer Paternal Aunt    Breast cancer Cousin    Stomach cancer Neg Hx    Pancreatic cancer Neg Hx    Esophageal cancer Neg Hx    Liver disease Neg Hx    Inflammatory bowel disease Neg Hx     Allergies  Allergen Reactions   Morphine Hives and Nausea And Vomiting    INTOLERANCE >  N & V   Penicillins Hives, Itching and Other (See Comments)     PATIENT HAS HAD A PCN REACTION WITH IMMEDIATE RASH, FACIAL/TONGUE/THROAT SWELLING, SOB, OR LIGHTHEADEDNESS WITH HYPOTENSION:  #  #  #  YES  #  #  #   Has patient had a PCN reaction causing severe rash involving mucus membranes or skin necrosis: Unknown Has patient had a PCN reaction that required hospitalization: No Has patient had a PCN reaction occurring within the last 10 years: No If all of the above answers are NO, then may proceed with Cephalosporin use.    Clarithromycin  Nausea And Vomiting    Current Medications:   Current Outpatient Medications:    acetaminophen  (TYLENOL ) 500 MG tablet, Take 2,000 mg by mouth daily as needed for moderate pain., Disp: , Rfl:    DULoxetine  (CYMBALTA ) 60 MG capsule, Take 1 capsule (60 mg total) by mouth at bedtime., Disp: 30 capsule, Rfl: 2   furosemide  (LASIX ) 20 MG tablet, TAKE 1 TABLET BY MOUTH DAILY AS NEEDED FOR FLUID., Disp: 90 tablet, Rfl: 1   HYDROmorphone  (DILAUDID ) 2 MG tablet, Take 1 tablet (2 mg total) by mouth 5 (five) times daily as needed for pain, Disp: 150 tablet, Rfl: 0   lamoTRIgine  (LAMICTAL ) 200 MG tablet, Take 1 tablet (200 mg total) by mouth at bedtime., Disp: 30 tablet, Rfl: 2   nitroGLYCERIN  (NITROSTAT ) 0.4 MG SL tablet, Place 1 tablet (0.4 mg total) under the tongue every 5 (five) minutes as needed for chest pain., Disp: 50 tablet, Rfl: 3   pantoprazole  (PROTONIX ) 40 MG tablet, TAKE 1 TABLET BY MOUTH EVERY DAY, Disp: 90 tablet, Rfl: 1   pravastatin  (PRAVACHOL ) 40 MG tablet, TAKE 1 TABLET BY MOUTH EVERY DAY, Disp: 90  tablet, Rfl: 1   risperiDONE  (RISPERDAL ) 1 MG tablet, Take 1 tablet (1 mg total)  by mouth at bedtime., Disp: 30 tablet, Rfl: 2   rizatriptan  (MAXALT -MLT) 10 MG disintegrating tablet, Take 1 tablet (10 mg total) by mouth as needed for migraine. May repeat in 2 hours if needed.  Maximum 2 tablets in 24 hours, Disp: 12 tablet, Rfl: 11   rOPINIRole  (REQUIP ) 1 MG tablet, TAKE 2 TABLETS BY MOUTH AT BEDTIME, Disp: 180 tablet, Rfl: 1   Semaglutide , 2 MG/DOSE, 8 MG/3ML SOPN, Inject 2 mg as directed once a week., Disp: 3 mL, Rfl: 1   topiramate  (TOPAMAX ) 200 MG tablet, TAKE 1 TABLET BY MOUTH EVERYDAY AT BEDTIME, Disp: 90 tablet, Rfl: 3   traZODone  (DESYREL ) 150 MG tablet, Take 1 tablet (150 mg total) by mouth at bedtime., Disp: 30 tablet, Rfl: 2   Review of Systems:   Negative unless otherwise specified per HPI.  Vitals:   Vitals:   01/12/24 1039  BP: 122/70  Pulse: 73  Temp: 98 F (36.7 C)  TempSrc: Temporal  SpO2: 98%  Weight: 216 lb (98 kg)  Height: 5' 5 (1.651 m)     Body mass index is 35.94 kg/m.  Physical Exam:   Physical Exam Vitals and nursing note reviewed.  Constitutional:      General: She is not in acute distress.    Appearance: She is well-developed. She is not ill-appearing or toxic-appearing.  Cardiovascular:     Rate and Rhythm: Normal rate and regular rhythm.     Pulses: Normal pulses.     Heart sounds: Normal heart sounds, S1 normal and S2 normal.  Pulmonary:     Effort: Pulmonary effort is normal.     Breath sounds: Normal breath sounds.  Skin:    General: Skin is warm and dry.  Neurological:     Mental Status: She is alert.     GCS: GCS eye subscore is 4. GCS verbal subscore is 5. GCS motor subscore is 6.  Psychiatric:        Speech: Speech normal.        Behavior: Behavior normal. Behavior is cooperative.     Assessment and Plan:   Assessment and Plan     Controlled diabetes mellitus type 2 with complications (HCC) Managed with Ozempic . A1c  well-controlled. No adverse effects reported. - Finish current 1 mg Ozempic  supply. - Increase Ozempic  to 2 mg post-surgery, pending surgeon's discretion. - Ordered blood work to ensure A1c is less than 7.6 for surgical clearance.  Chronic pain Current pain medication regimen ineffective. Desire to change or increase medication. Previous meloxicam use ineffective. - Consider changing pain management provider if current regimen remains ineffective.  Restless legs syndrome Managed with ropinirole . Current dose effective.  Hyperlipidemia Managed with pravastatin .  Gastroesophageal reflux disease Managed with pantoprazole .     Encounter for immunization Received flu shot.  Bipolar 1 disorder (HCC) Well controlled  Morbid obesity (HCC) Weight loss improving with use of GLP-1 Physical activity remains limited due to chronic pain Continue efforts.   Lucie Buttner, PA-C

## 2024-01-13 ENCOUNTER — Telehealth (HOSPITAL_COMMUNITY): Admitting: Psychiatry

## 2024-01-16 ENCOUNTER — Other Ambulatory Visit (HOSPITAL_COMMUNITY): Payer: Self-pay

## 2024-01-16 ENCOUNTER — Other Ambulatory Visit: Payer: Self-pay

## 2024-01-16 DIAGNOSIS — G894 Chronic pain syndrome: Secondary | ICD-10-CM | POA: Diagnosis not present

## 2024-01-16 DIAGNOSIS — M7061 Trochanteric bursitis, right hip: Secondary | ICD-10-CM | POA: Diagnosis not present

## 2024-01-16 DIAGNOSIS — M5416 Radiculopathy, lumbar region: Secondary | ICD-10-CM | POA: Diagnosis not present

## 2024-01-16 DIAGNOSIS — M47816 Spondylosis without myelopathy or radiculopathy, lumbar region: Secondary | ICD-10-CM | POA: Diagnosis not present

## 2024-01-16 MED ORDER — HYDROMORPHONE HCL 2 MG PO TABS
2.0000 mg | ORAL_TABLET | Freq: Every day | ORAL | 0 refills | Status: AC | PRN
  Filled 2024-01-16: qty 28, 6d supply, fill #0
  Filled 2024-01-16 (×2): qty 122, 26d supply, fill #0

## 2024-01-18 ENCOUNTER — Other Ambulatory Visit: Payer: Self-pay | Admitting: Surgery

## 2024-01-25 DIAGNOSIS — M7551 Bursitis of right shoulder: Secondary | ICD-10-CM | POA: Diagnosis not present

## 2024-01-25 DIAGNOSIS — Z9889 Other specified postprocedural states: Secondary | ICD-10-CM | POA: Diagnosis not present

## 2024-01-25 DIAGNOSIS — M7062 Trochanteric bursitis, left hip: Secondary | ICD-10-CM | POA: Diagnosis not present

## 2024-01-25 DIAGNOSIS — M7581 Other shoulder lesions, right shoulder: Secondary | ICD-10-CM | POA: Diagnosis not present

## 2024-01-25 DIAGNOSIS — M75121 Complete rotator cuff tear or rupture of right shoulder, not specified as traumatic: Secondary | ICD-10-CM | POA: Diagnosis not present

## 2024-01-27 ENCOUNTER — Other Ambulatory Visit: Payer: Self-pay

## 2024-01-27 ENCOUNTER — Inpatient Hospital Stay: Admission: RE | Admit: 2024-01-27 | Discharge: 2024-01-27 | Attending: Surgery

## 2024-01-27 VITALS — Wt 219.0 lb

## 2024-01-27 DIAGNOSIS — M75101 Unspecified rotator cuff tear or rupture of right shoulder, not specified as traumatic: Secondary | ICD-10-CM

## 2024-01-27 DIAGNOSIS — E119 Type 2 diabetes mellitus without complications: Secondary | ICD-10-CM

## 2024-01-27 DIAGNOSIS — I25119 Atherosclerotic heart disease of native coronary artery with unspecified angina pectoris: Secondary | ICD-10-CM

## 2024-01-27 DIAGNOSIS — Z01810 Encounter for preprocedural cardiovascular examination: Secondary | ICD-10-CM

## 2024-01-27 HISTORY — DX: Nicotine dependence, cigarettes, uncomplicated: F17.210

## 2024-01-27 HISTORY — DX: Unspecified rotator cuff tear or rupture of unspecified shoulder, not specified as traumatic: M75.100

## 2024-01-27 NOTE — Patient Instructions (Addendum)
 Your procedure is scheduled on:02-02-24 Thursday Report to the Registration Desk on the 1st floor of the [facility].Then proceed to the 2nd floor Surgery Desk To find out your arrival time, please call [phone] between 1PM - 3PM on:02-01-24 Wednesday If your arrival time is 6:00 am, do not arrive before that time as the Medical Mall entrance doors do not open until 6:00 am.  REMEMBER: Instructions that are not followed completely may result in serious medical risk, up to and including death; or upon the discretion of your surgeon and anesthesiologist your surgery may need to be rescheduled.  Do not eat food after midnight the night before surgery.  No gum chewing or hard candies.  You may however, drink Water up to 2 hours before you are scheduled to arrive for your surgery. Do not drink anything within 2 hours of your scheduled arrival time.  In addition, your doctor has ordered for you to drink the provided:  Gatorade G2 Drinking this carbohydrate drink up to two hours before surgery helps to reduce insulin resistance and improve patient outcomes. Please complete drinking 2 hours before scheduled arrival time.  One week prior to surgery:Stop NOW (01-27-24) Stop Anti-inflammatories (NSAIDS) such as Advil, Aleve , Ibuprofen, Motrin, Naproxen , Naprosyn  and Aspirin  based products such as Excedrin, Goody's Powder, BC Powder. Stop ANY OVER THE COUNTER supplements until after surgery.  You may however, continue to take Tylenol  if needed for pain up until the day of surgery.  Stop your Semaglutide  (Ozempic ) 7 days prior to surgery-Do NOT take again until AFTER surgery  Continue taking all of your other prescription medications up until the day of surgery.  ON THE DAY OF SURGERY ONLY TAKE THESE MEDICATIONS WITH SIPS OF WATER: -HYDROmorphone  (DILAUDID )   No Alcohol  for 24 hours before or after surgery.  No Smoking including e-cigarettes for 24 hours before surgery.  No chewable tobacco  products for at least 6 hours before surgery.  No nicotine  patches on the day of surgery.  Do not use any recreational drugs for at least a week (preferably 2 weeks) before your surgery.  Please be advised that the combination of cocaine and anesthesia may have negative outcomes, up to and including death. If you test positive for cocaine, your surgery will be cancelled.  On the morning of surgery brush your teeth with toothpaste and water, you may rinse your mouth with mouthwash if you wish. Do not swallow any toothpaste or mouthwash.  Use CHG Soap as directed on instruction sheet.  Do not wear jewelry, make-up, hairpins, clips or nail polish.  For welded (permanent) jewelry: bracelets, anklets, waist bands, etc.  Please have this removed prior to surgery.  If it is not removed, there is a chance that hospital personnel will need to cut it off on the day of surgery.  Do not wear lotions, powders, or perfumes.   Do not shave body hair from the neck down 48 hours before surgery.  Contact lenses, hearing aids and dentures may not be worn into surgery.  Do not bring valuables to the hospital. [facility] is not responsible for any missing/lost belongings or valuables.   Notify your doctor if there is any change in your medical condition (cold, fever, infection).  Wear comfortable clothing (specific to your surgery type) to the hospital.  After surgery, you can help prevent lung complications by doing breathing exercises.  Take deep breaths and cough every 1-2 hours. Your doctor may order a device called an Facilities Manager to  help you take deep breaths. When coughing or sneezing, hold a pillow firmly against your incision with both hands. This is called "splinting." Doing this helps protect your incision. It also decreases belly discomfort.  If you are being admitted to the hospital overnight, leave your suitcase in the car. After surgery it may be brought to your room.  In [place]  of increased patient census, it may be necessary for you, the patient, to continue your postoperative care in the Same Day Surgery department.  If you are being discharged the day of surgery, you will not be allowed to drive home. You will need a responsible individual to drive you home and stay with you for 24 hours after surgery.   If you are taking public transportation, you will need to have a responsible individual with you.  Please call the Pre-admissions Testing Dept. at [phone] if you have any questions about these instructions.  Surgery Visitation Policy:  Patients having surgery or a procedure may have two visitors.  Children under the age of 50 must have an adult with them who is not the patient.                                                                                                             Preparing for Surgery with CHLORHEXIDINE  GLUCONATE (CHG) Soap  Chlorhexidine  Gluconate (CHG) Soap  o An antiseptic cleaner that kills germs and bonds with the skin to continue killing germs even after washing  o Used for showering the night before surgery and morning of surgery  Before surgery, you can play an important role by reducing the number of germs on your skin.  CHG (Chlorhexidine  gluconate) soap is an antiseptic cleanser which kills germs and bonds with the skin to continue killing germs even after washing.  Please do not use if you have an allergy to CHG or antibacterial soaps. If your skin becomes reddened/irritated stop using the CHG.  1. Shower the NIGHT BEFORE SURGERY with CHG soap.  2. If you choose to wash your hair, wash your hair first as usual with your normal shampoo.  3. After shampooing, rinse your hair and body thoroughly to remove the shampoo.  4. Use CHG as you would any other liquid soap. You can apply CHG directly to the skin and wash gently with a clean washcloth.  5. Apply the CHG soap to your body only from the neck down. Do not use  on open wounds or open sores. Avoid contact with your eyes, ears, mouth, and genitals (private parts). Wash face and genitals (private parts) with your normal soap.  6. Wash thoroughly, paying special attention to the area where your surgery will be performed.  7. Thoroughly rinse your body with warm water.  8. Do not shower/wash with your normal soap after using and rinsing off the CHG soap.  9. Do not use lotions, oils, etc., after showering with CHG.  10. Pat yourself dry with a clean towel.  11. Wear clean pajamas to bed the night before surgery.  12. Place clean sheets on your bed the night of your shower and do not sleep with pets.  13. Do not apply any deodorants/lotions/powders.  14. Please wear clean clothes to the hospital.  15. Remember to brush your teeth with your regular toothpaste.   Merchandiser, Retail to address health-related social needs:  [email or web address]

## 2024-01-31 ENCOUNTER — Inpatient Hospital Stay: Admission: RE | Admit: 2024-01-31 | Discharge: 2024-01-31 | Attending: Surgery

## 2024-01-31 DIAGNOSIS — E119 Type 2 diabetes mellitus without complications: Secondary | ICD-10-CM

## 2024-01-31 DIAGNOSIS — I25119 Atherosclerotic heart disease of native coronary artery with unspecified angina pectoris: Secondary | ICD-10-CM

## 2024-01-31 DIAGNOSIS — Z01810 Encounter for preprocedural cardiovascular examination: Secondary | ICD-10-CM

## 2024-02-01 MED ORDER — CHLORHEXIDINE GLUCONATE 0.12 % MT SOLN
15.0000 mL | Freq: Once | OROMUCOSAL | Status: AC
  Administered 2024-02-02: 15 mL via OROMUCOSAL

## 2024-02-01 MED ORDER — ORAL CARE MOUTH RINSE: 15.0000 mL | Freq: Once | OROMUCOSAL | Status: AC

## 2024-02-01 MED ORDER — CEFAZOLIN SODIUM-DEXTROSE 2-4 GM/100ML-% IV SOLN: 2.0000 g | INTRAVENOUS | Status: DC

## 2024-02-02 ENCOUNTER — Ambulatory Visit: Admission: RE | Admit: 2024-02-02 | Discharge: 2024-02-02 | Disposition: A | Attending: Surgery | Admitting: Surgery

## 2024-02-02 ENCOUNTER — Other Ambulatory Visit: Payer: Self-pay

## 2024-02-02 ENCOUNTER — Encounter: Payer: Self-pay | Admitting: Surgery

## 2024-02-02 ENCOUNTER — Ambulatory Visit

## 2024-02-02 ENCOUNTER — Encounter: Payer: Self-pay | Admitting: Urgent Care

## 2024-02-02 ENCOUNTER — Ambulatory Visit: Payer: Self-pay | Admitting: Urgent Care

## 2024-02-02 ENCOUNTER — Encounter: Admission: RE | Disposition: A | Payer: Self-pay | Source: Home / Self Care | Attending: Surgery

## 2024-02-02 DIAGNOSIS — K3184 Gastroparesis: Secondary | ICD-10-CM | POA: Insufficient documentation

## 2024-02-02 DIAGNOSIS — K219 Gastro-esophageal reflux disease without esophagitis: Secondary | ICD-10-CM | POA: Insufficient documentation

## 2024-02-02 DIAGNOSIS — M7551 Bursitis of right shoulder: Secondary | ICD-10-CM | POA: Insufficient documentation

## 2024-02-02 DIAGNOSIS — Z7985 Long-term (current) use of injectable non-insulin antidiabetic drugs: Secondary | ICD-10-CM | POA: Diagnosis not present

## 2024-02-02 DIAGNOSIS — I2510 Atherosclerotic heart disease of native coronary artery without angina pectoris: Secondary | ICD-10-CM | POA: Insufficient documentation

## 2024-02-02 DIAGNOSIS — G4733 Obstructive sleep apnea (adult) (pediatric): Secondary | ICD-10-CM | POA: Diagnosis not present

## 2024-02-02 DIAGNOSIS — F419 Anxiety disorder, unspecified: Secondary | ICD-10-CM | POA: Diagnosis not present

## 2024-02-02 DIAGNOSIS — M94211 Chondromalacia, right shoulder: Secondary | ICD-10-CM | POA: Insufficient documentation

## 2024-02-02 DIAGNOSIS — E1143 Type 2 diabetes mellitus with diabetic autonomic (poly)neuropathy: Secondary | ICD-10-CM | POA: Insufficient documentation

## 2024-02-02 DIAGNOSIS — M75121 Complete rotator cuff tear or rupture of right shoulder, not specified as traumatic: Secondary | ICD-10-CM | POA: Insufficient documentation

## 2024-02-02 DIAGNOSIS — F319 Bipolar disorder, unspecified: Secondary | ICD-10-CM | POA: Diagnosis not present

## 2024-02-02 DIAGNOSIS — E119 Type 2 diabetes mellitus without complications: Secondary | ICD-10-CM

## 2024-02-02 DIAGNOSIS — M25811 Other specified joint disorders, right shoulder: Secondary | ICD-10-CM | POA: Insufficient documentation

## 2024-02-02 HISTORY — PX: SHOULDER OPEN ROTATOR CUFF REPAIR: SHX2407

## 2024-02-02 HISTORY — PX: SUBACROMIAL DECOMPRESSION: SHX5174

## 2024-02-02 HISTORY — PX: STERIOD INJECTION: SHX5046

## 2024-02-02 HISTORY — PX: POSTERIOR LUMBAR FUSION 2 WITH HARDWARE REMOVAL: SHX7297

## 2024-02-02 LAB — GLUCOSE, CAPILLARY
Glucose-Capillary: 98 mg/dL (ref 70–99)
Glucose-Capillary: 135 mg/dL — ABNORMAL HIGH (ref 70–99)

## 2024-02-02 SURGERY — ARTHROSCOPY, SHOULDER WITH DEBRIDEMENT
Anesthesia: General | Site: Shoulder | Laterality: Right

## 2024-02-02 MED ORDER — BUPIVACAINE LIPOSOME 1.3 % IJ SUSP
INTRAMUSCULAR | Status: AC
  Filled 2024-02-02: qty 20

## 2024-02-02 MED ORDER — FENTANYL CITRATE (PF) 50 MCG/ML IJ SOSY
PREFILLED_SYRINGE | INTRAMUSCULAR | Status: AC
  Filled 2024-02-02: qty 1

## 2024-02-02 MED ORDER — CEFAZOLIN SODIUM-DEXTROSE 2-3 GM-%(50ML) IV SOLR
INTRAVENOUS | Status: DC | PRN
  Administered 2024-02-02: 2 g via INTRAVENOUS

## 2024-02-02 MED ORDER — BUPIVACAINE-EPINEPHRINE (PF) 0.5% -1:200000 IJ SOLN
INTRAMUSCULAR | Status: DC | PRN
  Administered 2024-02-02: 5 mL via PERINEURAL
  Administered 2024-02-02: 25 mL via PERINEURAL

## 2024-02-02 MED ORDER — BUPIVACAINE LIPOSOME 1.3 % IJ SUSP
INTRAMUSCULAR | Status: DC | PRN
  Administered 2024-02-02: 20 mL via PERINEURAL

## 2024-02-02 MED ORDER — LIDOCAINE HCL (PF) 1 % IJ SOLN
INTRAMUSCULAR | Status: DC | PRN
  Administered 2024-02-02: 3 mL via SUBCUTANEOUS

## 2024-02-02 MED ORDER — PHENYLEPHRINE HCL-NACL 20-0.9 MG/250ML-% IV SOLN
INTRAVENOUS | Status: DC | PRN
  Administered 2024-02-02: 20 ug/min via INTRAVENOUS

## 2024-02-02 MED ORDER — OXYCODONE HCL 5 MG PO TABS: 5.0000 mg | ORAL_TABLET | ORAL | 0 refills | Status: AC | PRN

## 2024-02-02 MED ORDER — PROPOFOL 10 MG/ML IV BOLUS
INTRAVENOUS | Status: DC | PRN
  Administered 2024-02-02: 200 mg via INTRAVENOUS

## 2024-02-02 MED ORDER — MIDAZOLAM HCL (PF) 2 MG/2ML IJ SOLN
1.0000 mg | INTRAMUSCULAR | Status: AC | PRN
  Administered 2024-02-02 (×2): 1 mg via INTRAVENOUS

## 2024-02-02 MED ORDER — CEFAZOLIN SODIUM-DEXTROSE 2-4 GM/100ML-% IV SOLN
INTRAVENOUS | Status: AC
  Filled 2024-02-02: qty 100

## 2024-02-02 MED ORDER — LACTATED RINGERS IV SOLN: INTRAVENOUS | Status: DC | PRN

## 2024-02-02 MED ORDER — PHENYLEPHRINE 80 MCG/ML (10ML) SYRINGE FOR IV PUSH (FOR BLOOD PRESSURE SUPPORT)
PREFILLED_SYRINGE | INTRAVENOUS | Status: DC | PRN
  Administered 2024-02-02 (×13): 80 ug via INTRAVENOUS

## 2024-02-02 MED ORDER — BUPIVACAINE-EPINEPHRINE (PF) 0.5% -1:200000 IJ SOLN
INTRAMUSCULAR | Status: AC
  Filled 2024-02-02: qty 30

## 2024-02-02 MED ORDER — PROPOFOL 10 MG/ML IV BOLUS
INTRAVENOUS | Status: AC
  Filled 2024-02-02: qty 40

## 2024-02-02 MED ORDER — LIDOCAINE HCL (PF) 1 % IJ SOLN
INTRAMUSCULAR | Status: AC
  Filled 2024-02-02: qty 5

## 2024-02-02 MED ORDER — SODIUM CHLORIDE 0.9 % IV SOLN: INTRAVENOUS | Status: DC

## 2024-02-02 MED ORDER — ONDANSETRON HCL 4 MG/2ML IJ SOLN
INTRAMUSCULAR | Status: DC | PRN
  Administered 2024-02-02: 4 mg via INTRAVENOUS

## 2024-02-02 MED ORDER — PANTOPRAZOLE SODIUM 40 MG PO TBEC: 40.0000 mg | DELAYED_RELEASE_TABLET | Freq: Every evening | ORAL | Status: AC

## 2024-02-02 MED ORDER — BUPIVACAINE HCL (PF) 0.5 % IJ SOLN
INTRAMUSCULAR | Status: AC
  Filled 2024-02-02: qty 10

## 2024-02-02 MED ORDER — KETOROLAC TROMETHAMINE 30 MG/ML IJ SOLN
INTRAMUSCULAR | Status: AC
  Filled 2024-02-02: qty 1

## 2024-02-02 MED ORDER — MIDAZOLAM HCL 2 MG/2ML IJ SOLN
INTRAMUSCULAR | Status: AC
  Filled 2024-02-02: qty 2

## 2024-02-02 MED ORDER — LIDOCAINE HCL (CARDIAC) PF 100 MG/5ML IV SOSY
PREFILLED_SYRINGE | INTRAVENOUS | Status: DC | PRN
  Administered 2024-02-02: 100 mg via INTRAVENOUS

## 2024-02-02 MED ORDER — FENTANYL CITRATE (PF) 50 MCG/ML IJ SOSY
50.0000 ug | PREFILLED_SYRINGE | Freq: Once | INTRAMUSCULAR | Status: AC
  Administered 2024-02-02: 50 ug via INTRAVENOUS

## 2024-02-02 MED ORDER — SEMAGLUTIDE (2 MG/DOSE) 8 MG/3ML ~~LOC~~ SOPN: 2.0000 mg | PEN_INJECTOR | SUBCUTANEOUS | Status: DC

## 2024-02-02 MED ORDER — ACETAMINOPHEN 10 MG/ML IV SOLN
INTRAVENOUS | Status: DC | PRN
  Administered 2024-02-02: 1000 mg via INTRAVENOUS

## 2024-02-02 MED ORDER — TRIAMCINOLONE ACETONIDE 40 MG/ML IJ SUSP
INTRAMUSCULAR | Status: DC | PRN
  Administered 2024-02-02: 40 mg via INTRAMUSCULAR

## 2024-02-02 MED ORDER — ACETAMINOPHEN 10 MG/ML IV SOLN
INTRAVENOUS | Status: AC
  Filled 2024-02-02: qty 100

## 2024-02-02 MED ORDER — BUPIVACAINE HCL (PF) 0.5 % IJ SOLN
INTRAMUSCULAR | Status: DC | PRN
  Administered 2024-02-02: 10 mL via PERINEURAL

## 2024-02-02 MED ORDER — TRIAMCINOLONE ACETONIDE 40 MG/ML IJ SUSP
INTRAMUSCULAR | Status: AC
  Filled 2024-02-02: qty 1

## 2024-02-02 MED ORDER — ONDANSETRON HCL 4 MG/2ML IJ SOLN
INTRAMUSCULAR | Status: AC
  Filled 2024-02-02: qty 2

## 2024-02-02 MED ORDER — FENTANYL CITRATE (PF) 100 MCG/2ML IJ SOLN
INTRAMUSCULAR | Status: DC | PRN
  Administered 2024-02-02: 50 ug via INTRAVENOUS

## 2024-02-02 MED ORDER — ROCURONIUM BROMIDE 100 MG/10ML IV SOLN
INTRAVENOUS | Status: DC | PRN
  Administered 2024-02-02: 60 mg via INTRAVENOUS

## 2024-02-02 MED ORDER — FENTANYL CITRATE (PF) 100 MCG/2ML IJ SOLN
INTRAMUSCULAR | Status: AC
  Filled 2024-02-02: qty 2

## 2024-02-02 MED ORDER — DROPERIDOL 2.5 MG/ML IJ SOLN: 0.6250 mg | Freq: Once | INTRAMUSCULAR | Status: DC | PRN

## 2024-02-02 MED ORDER — SUGAMMADEX SODIUM 200 MG/2ML IV SOLN
INTRAVENOUS | Status: DC | PRN
  Administered 2024-02-02: 200 mg via INTRAVENOUS

## 2024-02-02 MED ORDER — OXYCODONE HCL 5 MG PO TABS
5.0000 mg | ORAL_TABLET | Freq: Once | ORAL | Status: AC
  Administered 2024-02-02: 5 mg via ORAL

## 2024-02-02 MED ORDER — OXYCODONE HCL 5 MG PO TABS
ORAL_TABLET | ORAL | Status: AC
  Filled 2024-02-02: qty 1

## 2024-02-02 MED ORDER — RINGERS IRRIGATION IR SOLN
Status: DC | PRN
  Administered 2024-02-02: 3000 mL

## 2024-02-02 MED ORDER — KETOROLAC TROMETHAMINE 30 MG/ML IJ SOLN
30.0000 mg | Freq: Once | INTRAMUSCULAR | Status: AC
  Administered 2024-02-02: 30 mg via INTRAVENOUS

## 2024-02-02 MED ORDER — CHLORHEXIDINE GLUCONATE 0.12 % MT SOLN
OROMUCOSAL | Status: AC
  Filled 2024-02-02: qty 15

## 2024-02-02 MED ORDER — ROCURONIUM BROMIDE 10 MG/ML (PF) SYRINGE
PREFILLED_SYRINGE | INTRAVENOUS | Status: AC
  Filled 2024-02-02: qty 10

## 2024-02-02 MED ORDER — FENTANYL CITRATE (PF) 100 MCG/2ML IJ SOLN: 25.0000 ug | INTRAMUSCULAR | Status: DC | PRN

## 2024-02-02 SURGICAL SUPPLY — 42 items
ANCHOR BONE REGENETEN (Anchor) IMPLANT
ANCHOR HEALICOIL REGEN 5.5 (Anchor) IMPLANT
ANCHOR QFIX 2.8 SUT MINI TAPE (Anchor) IMPLANT
ANCHOR TENDON REGENETEN (Staple) IMPLANT
BIT DRILL JUGRKNT W/NDL BIT2.9 (DRILL) IMPLANT
BLADE FULL RADIUS 3.5 (BLADE) ×2 IMPLANT
BUR ACROMIONIZER 4.0 (BURR) ×2 IMPLANT
CHLORAPREP W/TINT 26 (MISCELLANEOUS) ×2 IMPLANT
COVER MAYO STAND STRL (DRAPES) ×2 IMPLANT
DILATOR 5.5 THREADED HEALICOIL (MISCELLANEOUS) IMPLANT
ELECTRODE REM PT RTRN 9FT ADLT (ELECTROSURGICAL) ×2 IMPLANT
GAUZE SPONGE 4X4 12PLY STRL (GAUZE/BANDAGES/DRESSINGS) ×2 IMPLANT
GAUZE XEROFORM 1X8 LF (GAUZE/BANDAGES/DRESSINGS) ×2 IMPLANT
GLOVE BIO SURGEON STRL SZ7.5 (GLOVE) ×4 IMPLANT
GLOVE BIO SURGEON STRL SZ8 (GLOVE) ×4 IMPLANT
GLOVE BIOGEL PI IND STRL 8 (GLOVE) ×4 IMPLANT
GOWN STRL REUS W/ TWL LRG LVL3 (GOWN DISPOSABLE) ×2 IMPLANT
GOWN STRL REUS W/ TWL XL LVL3 (GOWN DISPOSABLE) ×2 IMPLANT
IMPL REGENETEN MEDIUM (Shoulder) IMPLANT
IV LR IRRIG 3000ML ARTHROMATIC (IV SOLUTION) ×2 IMPLANT
KIT CANNULA 8X76-LX IN CANNULA (CANNULA) ×2 IMPLANT
KIT SUTURE 2.8 Q-FIX DISP (MISCELLANEOUS) IMPLANT
MANIFOLD NEPTUNE II (INSTRUMENTS) ×4 IMPLANT
MASK FACE SPIDER DISP (MASK) ×2 IMPLANT
MAT ABSORB FLUID 56X50 GRAY (MISCELLANEOUS) ×2 IMPLANT
PACK ARTHROSCOPY SHOULDER (MISCELLANEOUS) ×2 IMPLANT
PAD ABD DERMACEA PRESS 5X9 (GAUZE/BANDAGES/DRESSINGS) ×4 IMPLANT
PASSER SUT FIRSTPASS SELF (INSTRUMENTS) IMPLANT
PENCIL SMOKE EVACUATOR (MISCELLANEOUS) IMPLANT
SLING ULTRA II LG (MISCELLANEOUS) ×2 IMPLANT
SLING ULTRA II M (MISCELLANEOUS) IMPLANT
SOLN STERILE WATER 500 ML (IV SOLUTION) ×2 IMPLANT
SPONGE T-LAP 18X18 ~~LOC~~+RFID (SPONGE) ×2 IMPLANT
STAPLER SKIN PROX 35W (STAPLE) ×2 IMPLANT
STRAP SAFETY 5IN WIDE (MISCELLANEOUS) ×2 IMPLANT
SUT ETHIBOND 0 MO6 C/R (SUTURE) ×2 IMPLANT
SUT ULTRABRAID 2 COBRAID 38 (SUTURE) IMPLANT
SUT VIC AB 2-0 CT1 TAPERPNT 27 (SUTURE) ×4 IMPLANT
TAPE MICROFOAM 4IN (TAPE) ×2 IMPLANT
TRAP FLUID SMOKE EVACUATOR (MISCELLANEOUS) ×2 IMPLANT
TUBE SET DOUBLEFLO INFLOW (TUBING) ×2 IMPLANT
WAND WEREWOLF FLOW 90D (MISCELLANEOUS) ×2 IMPLANT

## 2024-02-02 NOTE — Anesthesia Procedure Notes (Signed)
 Anesthesia Regional Block: Interscalene brachial plexus block   Pre-Anesthetic Checklist: , timeout performed,  Correct Patient, Correct Site, Correct Laterality,  Correct Procedure, Correct Position, site marked,  Risks and benefits discussed,  Surgical consent,  Pre-op evaluation,  At surgeon's request and post-op pain management  Laterality: Right and Upper  Prep: chloraprep       Needles:  Injection technique: Single-shot  Needle Type: Stimiplex     Needle Length: 5cm  Needle Gauge: 22     Additional Needles:   Procedures:,,,, ultrasound used (permanent image in chart),,    Narrative:  Start time: 02/02/2024 9:17 AM End time: 02/02/2024 9:21 AM Injection made incrementally with aspirations every 5 mL.  Performed by: Personally  Anesthesiologist: Dario Barter, MD  Additional Notes: Functioning IV was confirmed and monitors were applied.  A 50mm 22ga Stimuplex needle was used. Sterile prep and drape,hand hygiene and sterile gloves were used.  Negative aspiration and negative test dose prior to incremental administration of local anesthetic. The patient tolerated the procedure well.

## 2024-02-02 NOTE — Anesthesia Procedure Notes (Signed)
 Procedure Name: Intubation Date/Time: 02/02/2024 10:08 AM  Performed by: Colon Morna SQUIBB, RNPre-anesthesia Checklist: Patient identified, Emergency Drugs available, Suction available and Patient being monitored Patient Re-evaluated:Patient Re-evaluated prior to induction Oxygen Delivery Method: Circle System Utilized Preoxygenation: Pre-oxygenation with 100% oxygen Induction Type: IV induction Ventilation: Mask ventilation without difficulty Laryngoscope Size: McGrath and 3 Grade View: Grade I Tube type: Oral Number of attempts: 1 Airway Equipment and Method: Stylet and Oral airway Placement Confirmation: ETT inserted through vocal cords under direct vision, positive ETCO2 and breath sounds checked- equal and bilateral Secured at: 22 cm Tube secured with: Tape Dental Injury: Teeth and Oropharynx as per pre-operative assessment  Comments: Elective mcgrath use

## 2024-02-02 NOTE — Anesthesia Postprocedure Evaluation (Signed)
 Anesthesia Post Note  Patient: Reginna Sermeno Mihok  Procedure(s) Performed: ARTHROSCOPY, SHOULDER WITH DEBRIDEMENT (Right: Shoulder) DECOMPRESSION, SUBACROMIAL SPACE (Right: Shoulder) REPAIR, ROTATOR CUFF, OPEN (Right: Shoulder) STEROID INJECTION LEFT TROCHANTERIC BURSA (Left: Hip)  Patient location during evaluation: PACU Anesthesia Type: General Level of consciousness: awake and alert Pain management: pain level controlled Vital Signs Assessment: post-procedure vital signs reviewed and stable Respiratory status: spontaneous breathing, nonlabored ventilation, respiratory function stable and patient connected to nasal cannula oxygen Cardiovascular status: blood pressure returned to baseline and stable Postop Assessment: no apparent nausea or vomiting Anesthetic complications: no   No notable events documented.   Last Vitals:  Vitals:   02/02/24 1210 02/02/24 1226  BP: 119/70 119/67  Pulse: 79 74  Resp: 20 14  Temp: (!) 36.3 C 36.7 C  SpO2: 95% 97%    Last Pain:  Vitals:   02/02/24 1226  TempSrc: Temporal  PainSc: 3                  Prentice Murphy

## 2024-02-02 NOTE — Op Note (Signed)
 02/02/2024  11:27 AM  Patient:   [name] White  Pre-Op Diagnosis:   1. Recurrent rotator cuff tear, right shoulder. 2. Chronic left trochanteric bursitis.  Post-Op Diagnosis:   1. Impingement/tendinopathy with recurrent rotator, right shoulder.  2. Chronic left trochanteric bursitis.  Procedure:   1. Limited arthroscopic debridement, arthroscopic subacromial decompression, and mini-open rotator cuff repair augmented with a Smith & Nephew Regeneten patch, right shoulder.  2. Left trochanteric bursa injection.  Anesthesia:   General endotracheal with interscalene block using Exparel  placed preoperatively by the anesthesiologist.  Surgeon:   DOROTHA Reyes Maltos, MD  Assistant:   Gustavo Level, PA-C  Findings:   As above. There was a recurrent full-thickness tear involving the middle and posterior portions of the supraspinatus tendon with extension into the anteriormost fibers of the infraspinatus tendon. The remainder of the rotator cuff was in satisfactory condition. There was a focal area of grade II chondromalacia involving the anterosuperior portion of the humeral head. Otherwise, the glenoid and humeral articular surfaces were in excellent condition. There was minimal fraying of the labrum anteriorly without frank detachment from the glenoid rim.  Complications:   None  Estimated blood loss:   10 cc  Fluids:   600 cc  Tourniquet time:   None  Drains:   None  Closure:   Staples      Brief clinical note:   The patient is a 58 year old female who is now nearly 2 years status post a right shoulder arthroscopy with debridement, decompression, and application of a Regeneten patch for a small articular sided partial-thickness rotator cuff tear involving the supraspinatus tendon. The patient's symptoms have progressed despite medications, activity modification, etc. The patient's history and examination are consistent with impingement/tendinopathy with a rotator cuff tear. These findings were  confirmed by MRI scan. The patient presents at this time for definitive management of these shoulder symptoms.    The patient also has been treated for recurrent/chronic left trochanteric bursitis. The patient has requested that a left trochanteric bursa injection be performed while she is asleep.  Procedure:   The patient underwent placement of an interscalene block using Exparel  by the anesthesiologist in the preoperative holding area before being brought into the operating room and lain in the supine position. The patient then underwent general endotracheal intubation and anesthesia before being repositioned in the beach chair position using the beach chair positioner. At this point, a timeout was performed to verify the appropriate surgical site before the left trochanteric region was injected sterilely using a solution of 1 cc of Kenalog  40 (40 mg) and 5 cc of 0.5% Sensorcaine  with epinephrine . Subsequently, the right shoulder and upper extremity were prepped with ChloraPrep solution before being draped sterilely. Preoperative antibiotics were administered. A timeout was performed to confirm the proper surgical site before the expected portal sites and incision site were injected with 0.5% Sensorcaine  with epinephrine .   A posterior portal was created and the glenohumeral joint thoroughly inspected with the findings as described above. An anterior portal was created using an outside-in technique. The labrum and rotator cuff were further probed, again confirming the above-noted findings. The area of the recurrent rotator cuff tear was debrided back to stable margins using the full-radius resector. The area of focal partial-thickness chondromalacia involving the anterosuperior portion of the humeral head also was debrided back to stable margins using the full-radius resector. The ArthroCare wand was inserted and used to obtain hemostasis as well as to anneal the labrum superiorly and anteriorly.  The  instruments were removed from the joint after suctioning the excess fluid.  The camera was repositioned through the posterior portal into the subacromial space. A separate lateral portal was created using an outside-in technique. The 3.5 mm full-radius resector was introduced and used to perform a subtotal bursectomy. The ArthroCare wand was then inserted and used to remove the periosteal tissue off the undersurface of the anterior third of the acromion. In doing so, a small area of bony lipping was again identified along the anterolateral aspect of the acromion. The 4.0 mm acromionizing bur was introduced and used to complete the decompression by removing the residual bony prominences along the undersurface of the anterior third of the acromion. The ArthroCare wand was reintroduced to obtain hemostasis. The instruments were then removed from the subacromial space after suctioning the excess fluid.  Utilizing the previous incision, an approximately 4-5 cm incision was made over the anterolateral aspect of the shoulder beginning at the anterolateral corner of the acromion and extending distally in line with the bicipital groove. This incision was carried down through the subcutaneous tissues to expose the deltoid fascia. The raphae between the anterior and middle thirds was identified and this plane developed to provide access into the subacromial space. Additional bursal tissues were debrided sharply using Metzenbaum scissors. The rotator cuff tear was readily identified.   The margins of the rotator cuff tear were debrided sharply with a #15 blade and the exposed greater tuberosity roughened with a rongeur. The tear was repaired using two Smith & Nephew 2.8 mm Q-Fix anchors. These sutures were then brought back laterally and secured using two Smith & Nephew Healicoil knotless RegeneSorb anchors to create a two-layer closure. An apparent watertight closure was obtained.  Given that this was a recurrent  rotator cuff tear and the tissues were of somewhat questionable quality, it was elected to reinforce this repair using a Smith & Nephew Regeneten patch. Therefore, a medium sized patch was selected and applied over the rotator cuff repair before being secured using the appropriate bone and soft tissue staples. This additional portion of the procedure added an extra level of complexity to the [facility] as well as adding an additional 20-25 minutes of surgical time.  The wound was copiously irrigated with sterile saline solution before the deltoid raphae was reapproximated using 2-0 Vicryl interrupted sutures. The subcutaneous tissues were closed in two layers using 2-0 Vicryl interrupted sutures before the skin was closed using staples. The portal sites also were closed using staples. A sterile bulky dressing was applied to the shoulder before the arm was placed into a shoulder immobilizer. The patient was then awakened, extubated, and returned to the recovery room in satisfactory condition after tolerating the procedure well.

## 2024-02-02 NOTE — H&P (Signed)
 History of Present Illness: [name] White is a 58 y.o. female who presents today for her surgical history and physical for upcoming right shoulder arthroscopy with debridement, decompression and repair of rotator cuff tear. Surgery is scheduled with Dr. Edie on 02/02/2024. The patient denies any recent trauma or injury affecting the left shoulder. The patient reports a 5 out of 10 pain score, she does take chronic Dilantin for ongoing back and hip pain. She denies any numbness or ting in the right upper extremity at today's visit. She denies any personal history of heart attack or stroke. She denies any history of asthma or COPD but she does smoke. She is a diabetic and is on Ozempic . She has been instructed to hold her next Ozempic  in preparation for surgery. She denies any history of DVT.  Past Medical History: Anxiety  Arthritis  Barrett's esophagus  Chronic headache  Colon polyp  Depression  Gastroparesis  Hidradenitis suppurativa  IBS (irritable bowel syndrome)  Migraine  OSA (obstructive sleep apnea)  Ovarian cyst  Plantar fasciitis  Restless leg  Tobacco abuse   Past Surgical History: COLONOSCOPY 2012  laparoscopy lysis of adhesions 04/22/2010  SALPINGECTOMY 04/22/2010  laparoscopy with mini laparotomy and cystotomy repair 04/30/2010  ARTHROPLASTY HIP TOTAL Right 04/25/2017  IMPLANTS: DePuy Tri Lock stem, size 6, std offset. DePuy Pinnacle Cup, size 52 mm. DePuy Altrx liner, size 32 by 52 mm, neutral. DePuy Biolox ceramic head ball, size 32 + 1 mm.  JOINT REPLACEMENT Right 04/25/2017  Extensive arthroscopic debridement, arthroscopic repair of partial-thickness subscapularis tendon tear, arthroscopic subacromial decompression, and mini-open repair of partial-thickness supraspinatus tear with Regeneten patch Right 03/18/2022 (Dr.Mccade Sullenberger- [facility])  ABDOMINAL HYSTERECTOMY  CESAREAN SECTION  CHOLECYSTECTOMY  elbow surgery  ESSURE TUBAL LIGATION  plantar fasciitis Right  wrist  surgery   Past Family History: No Known Problems Mother  Breast cancer Cousin (paternal)   Medications: acetaminophen  (TYLENOL ) 500 MG tablet Take 3,500 mg by mouth once daily  dicyclomine  (BENTYL ) 10 mg capsule once daily  DULoxetine  (CYMBALTA ) 60 MG DR capsule Take 60 mg by mouth once daily  FUROsemide  (LASIX ) 20 MG tablet  HYDROmorphone  (DILAUDID ) 2 MG tablet Take 2 mg by mouth 4 (four) times daily as needed for Pain  lamoTRIgine  (LAMICTAL ) 200 MG tablet Take 200 mg by mouth at bedtime  LINZESS  290 mcg capsule Take 1 capsule by mouth every morning before breakfast  meloxicam (MOBIC) 7.5 MG tablet TAKE 1 TABLET BY MOUTH TWICE A DAY WITH FOOD 60 tablet 0  metFORMIN  (GLUCOPHAGE -XR) 500 MG XR tablet Take 500 mg by mouth daily with breakfast  metoprolol  tartrate (LOPRESSOR ) 100 MG tablet  mupirocin  (BACTROBAN ) 2 % ointment  nitroGLYcerin  (NITROSTAT ) 0.4 MG SL tablet Place 1 tablet under the tongue every 5 (five) minutes as needed  OLANZapine  (ZYPREXA ) 10 MG tablet Take 1 tablet by mouth at bedtime  ondansetron  (ZUPLENZ  ORAL)  pantoprazole  (PROTONIX ) 40 MG DR tablet Take 1 tablet by mouth once daily  pravastatin  (PRAVACHOL ) 10 MG tablet  rizatriptan  (MAXALT -MLT) 10 MG disintegrating tablet Take 10 mg by mouth once as needed  rOPINIRole  (REQUIP ) 1 MG tablet Take 2 tablets by mouth nightly  simvastatin  (ZOCOR ) 20 MG tablet Take 20 mg by mouth at bedtime  SUMAtriptan  (IMITREX ) 100 MG tablet TAKE 1 TABLET AT ONSET OF HEADACHE. MAY REPEAT IN 2 HOURS IF NEED. MAX 2 TABS IN 24 HOURS  topiramate  (TOPAMAX ) 200 MG tablet Take 1 tablet by mouth at bedtime  traZODone  (DESYREL ) 100 MG tablet Take  200 mg by mouth at bedtime  traZODone  (DESYREL ) 50 MG tablet TAKE 1 TABLET (50 MG TOTAL) BY MOUTH AT BEDTIME AS NEEDED FOR SLEEP.   Allergies: Morphine (Hives and Nausea And Vomiting)   Penicillins Hives, Itching and Other (See Comments)  PATIENT HAS HAD A PCN REACTION WITH IMMEDIATE RASH,  FACIAL/TONGUE/THROAT SWELLING, SOB, OR LIGHTHEADEDNESS WITH HYPOTENSION: # # # YES # # #  Has patient had a PCN reaction causing severe rash involving mucus membranes or skin necrosis: Unknown Has patient had a PCN reaction that required hospitalization: No Has patient had a PCN reaction occurring within the last 10 years: No If all of the above answers are NO, then may proceed with Cephalosporin use.  Clarithromycin  Nausea And Vomiting and Unknown   Review of Systems:  A comprehensive 14 point ROS was performed, reviewed by me today, and the pertinent orthopaedic findings are documented in the HPI.  Physical Exam: BP 120/70  Ht 167.6 cm (5' 6)  Wt 99.5 kg (219 lb 6.4 oz)  BMI 35.41 kg/m  General/Constitutional: The patient appears to be well-nourished, well-developed, and in no acute distress. Neuro/Psych: Normal mood and affect, oriented to person, place and time. Eyes: Non-icteric. Pupils are equal, round, and reactive to light, and exhibit synchronous movement. ENT: Unremarkable. Lymphatic: No palpable adenopathy. Respiratory: Lungs clear to auscultation, Normal chest excursion, No wheezes, and Non-labored breathing Cardiovascular: Regular rate and rhythm. No murmurs. and No edema, swelling or tenderness, except as noted in detailed exam. Integumentary: No impressive skin lesions present, except as noted in detailed exam. Musculoskeletal: Unremarkable, except as noted in detailed exam.  Right shoulder exam: On examination, her surgical incision and arthroscopic portal sites are well-healed and without evidence for infection. No swelling, erythema, ecchymosis, abrasions, or other skin abnormalities are identified. She exhibits moderate-severe focal tenderness to palpation at the anterolateral corner of the acromion and mild tenderness along the lateral acromion. Actively, she is able to forward flex to 135 degrees, abduct to 130 degrees, and internally rotate to the right PSIS.  Passively, she is able to tolerate forward flexion to 160 degrees and abduction to 155 degrees. At 90 degrees of abduction, she is able to tolerate external rotation 80 degrees and internal rotation to 50 degrees. She experiences moderate pain at the extremes of all motions. She demonstrates 4-4+/5 strength with resisted internal and external rotation, and 4/5 strength with resisted forward flexion and abduction. She has mild-moderate pain with resisted forward flexion, and mild pain with resisted abduction and external rotation. She is neurovascularly intact to the right upper extremity and hand.  Imaging: MRI SHOULDER RIGHT WO CONTRAST:  Full-thickness tear of the supraspinatus tendon retracted up to 12 mm. The tear involves some of the anterior infraspinatus insertional fibers. Presumed prior bicipital tenodesis. Attenuation of the intra-articular fibers. 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   Impression: 1. Rotator cuff tendinitis, right. 2. Subacromial bursitis of right shoulder joint. 3. Nontraumatic complete tear of right rotator cuff. 4.  Status post rotator cuff repair, right shoulder.  Plan:  1. Treatment options were discussed today with the patient. 2. The patient is scheduled to undergo a right shoulder arthroscopy with debridement, decompression and mini open rotator cuff repair. 3. The patient was instructed on the risk and benefits of surgical intervention and wishes to proceed at this time. 4. The patient would also like to undergo a left greater trochanteric bursa steroid injection at the time of surgery, will discuss with Dr. Edie. 5. This document will serve as  a surgical history and physical for the patient. 6. The patient will follow-up on an as-needed basis at this time. They can call the clinic they have any questions, new symptoms develop or symptoms worsen.  The procedure was discussed with the patient, as were the potential risks (including bleeding,  infection, nerve and/or blood vessel injury, persistent or recurrent pain, failure of the repair, progression of arthritis, need for further surgery, blood clots, strokes, heart attacks and/or arhythmias, pneumonia, etc.) and benefits. The patient states her understanding and wishes to proceed.    H&P reviewed and patient re-examined. No changes.

## 2024-02-02 NOTE — Anesthesia Preprocedure Evaluation (Signed)
 Anesthesia Evaluation  Patient identified by MRN, date of birth, ID band Patient awake    Reviewed: Allergy & Precautions, NPO status , Patient's Chart, lab work & pertinent test results  History of Anesthesia Complications Negative for: history of anesthetic complications  Airway Mallampati: III  TM Distance: >3 FB Neck ROM: full    Dental  (+) Chipped, Edentulous Upper, Missing, Dental Advidsory Given   Pulmonary neg shortness of breath, sleep apnea , neg COPD, neg recent URI, Current Smoker, former smoker   Pulmonary exam normal        Cardiovascular (-) hypertension(-) angina + CAD and + DOE  (-) Past MI and (-) Cardiac Stents Normal cardiovascular exam(-) dysrhythmias (-) Valvular Problems/Murmurs  Seen by Cardiology preop op - cleared   Neuro/Psych  Headaches, neg Seizures PSYCHIATRIC DISORDERS Anxiety Depression Bipolar Disorder    Neuromuscular disease    GI/Hepatic Neg liver ROS,GERD  Medicated and Controlled,,  Endo/Other  negative endocrine ROSdiabetes    Renal/GU      Musculoskeletal   Abdominal   Peds  Hematology negative hematology ROS (+)   Anesthesia Other Findings Past Medical History: No date: Anterolisthesis No date: Arthritis No date: Barrett's esophagus     Comment:  egd 2007 + 2009 but none 2011 or 08/2010 egd No date: Bipolar 1 disorder (HCC) 02/03/2021: CAD (coronary artery disease)     Comment:  a.) cCTA 02/03/2021: Ca score = 1 (72nd percentile for               age/sex match control) No date: Chronic, continuous use of opioids No date: Colon polyp, hyperplastic No date: Depression 02/06/2021: Diastolic dysfunction     Comment:  a.) TTE 02/06/2021: EF 55-60%, mild LVH, G1DD No date: DOE (dyspnea on exertion) No date: Family history of adverse reaction to anesthesia     Comment:  a.) delayed emergence in 1st degree relative (brother) No date: Gastroparesis No date: Generalized anxiety  disorder with panic attacks No date: GERD (gastroesophageal reflux disease) No date: Grief reaction No date: Hidradenitis suppurativa No date: History of kidney stones No date: HLD (hyperlipidemia) No date: IBS (irritable bowel syndrome) No date: Migraine No date: Morbid obesity (HCC) No date: OSA (obstructive sleep apnea)     Comment:  a.) did not tolerate nocturnal PAP therapy mainly due to              anxiety and associated xerostomia No date: Ovarian cyst No date: Palpitations No date: Plantar fasciitis No date: PTSD (post-traumatic stress disorder) No date: Restless leg     Comment:  a.) on ropinirole  2003: Suicide attempt (HCC) No date: Tobacco abuse No date: Type 2 diabetes mellitus treated without insulin (HCC)  Past Surgical History: No date: ABDOMINAL HYSTERECTOMY     Comment:  secondary to endometriosis 2019: BACK SURGERY; Right     Comment:  lumbar ablation L3-S1 x 2  05/07/2021: BIOPSY     Comment:  Procedure: BIOPSY;  Surgeon: Wilhelmenia Aloha Raddle.,               MD;  Location: [facility] ENDOSCOPY;  Service: Gastroenterology;; No date: CESAREAN SECTION     Comment:  x 2 No date: CHOLECYSTECTOMY 2012: COLONOSCOPY W/ POLYPECTOMY 05/01/2020: COLONOSCOPY WITH PROPOFOL ; N/A     Comment:  Procedure: COLONOSCOPY WITH PROPOFOL ;  Surgeon:               Wilhelmenia Aloha Raddle., MD;  Location: [facility] ENDOSCOPY;  Service: Gastroenterology;  Laterality: N/A; 05/07/2021: COLONOSCOPY WITH PROPOFOL ; N/A     Comment:  Procedure: COLONOSCOPY WITH PROPOFOL ;  Surgeon:               Mansouraty, Aloha Raddle., MD;  Location: [facility] ENDOSCOPY;                Service: Gastroenterology;  Laterality: N/A; No date: ELBOW SURGERY; Left 05/01/2020: ENDOSCOPIC MUCOSAL RESECTION; N/A     Comment:  Procedure: ENDOSCOPIC MUCOSAL RESECTION;  Surgeon:               Wilhelmenia Aloha Raddle., MD;  Location: [facility] ENDOSCOPY;                Service: Gastroenterology;  Laterality: N/A; 05/07/2021:  ENDOSCOPIC MUCOSAL RESECTION; N/A     Comment:  Procedure: ENDOSCOPIC MUCOSAL RESECTION;  Surgeon:               Wilhelmenia, Aloha Raddle., MD;  Location: [facility] ENDOSCOPY;                Service: Gastroenterology;  Laterality: N/A; No date: GANGLION CYST EXCISION; Right 05/01/2020: HEMOSTASIS CLIP PLACEMENT     Comment:  Procedure: HEMOSTASIS CLIP PLACEMENT;  Surgeon:               Wilhelmenia Aloha Raddle., MD;  Location: [facility] ENDOSCOPY;                Service: Gastroenterology;; 05/07/2021: HEMOSTASIS CLIP PLACEMENT     Comment:  Procedure: HEMOSTASIS CLIP PLACEMENT;  Surgeon:               Wilhelmenia Aloha Raddle., MD;  Location: [facility] ENDOSCOPY;                Service: Gastroenterology;; 05/07/2021: HOT HEMOSTASIS; N/A     Comment:  Procedure: HOT HEMOSTASIS (ARGON PLASMA               COAGULATION/BICAP);  Surgeon: Wilhelmenia Aloha Raddle.,               MD;  Location: [facility] ENDOSCOPY;  Service: Gastroenterology;               Laterality: N/A; 04/22/2010: LAPAROSCOPIC LYSIS OF ADHESIONS 04/30/2010: LAPAROSCOPY     Comment:  with mini laparotomy and cystotomy repair No date: PLANTAR FASCIA SURGERY; Right 05/01/2020: POLYPECTOMY     Comment:  Procedure: POLYPECTOMY;  Surgeon: Wilhelmenia Aloha Raddle., MD;  Location: [facility] ENDOSCOPY;  Service:               Gastroenterology;; 05/07/2021: POLYPECTOMY     Comment:  Procedure: POLYPECTOMY;  Surgeon: Wilhelmenia Aloha Raddle., MD;  Location: [facility] ENDOSCOPY;  Service:               Gastroenterology;; 04/22/2010: SALPINGECTOMY; Right 05/01/2020: SUBMUCOSAL LIFTING INJECTION     Comment:  Procedure: SUBMUCOSAL LIFTING INJECTION;  Surgeon:               Wilhelmenia Aloha Raddle., MD;  Location: [facility] ENDOSCOPY;                Service: Gastroenterology;; 05/07/2021: SUBMUCOSAL LIFTING INJECTION     Comment:  Procedure: SUBMUCOSAL LIFTING INJECTION;  Surgeon:               Wilhelmenia Aloha Raddle., MD;  Location: [facility] ENDOSCOPY;  Service: Gastroenterology;; 04/25/2017: TOTAL HIP ARTHROPLASTY; Right     Comment:  Procedure: RIGHT TOTAL HIP ARTHROPLASTY ANTERIOR               APPROACH;  Surgeon: Fidel Rogue, MD;  Location: [facility]               OR;  Service: Orthopedics;  Laterality: Right;  Needs               RNFA No date: TUBAL LIGATION  BMI    Body Mass Index: 38.19 kg/m      Reproductive/Obstetrics negative OB ROS                              Anesthesia Physical Anesthesia Plan  ASA: 3  Anesthesia Plan: General   Post-op Pain Management: Regional block*   Induction: Intravenous  PONV Risk Score and Plan: Ondansetron , Dexamethasone , Midazolam  and Treatment may vary due to age or medical condition  Airway Management Planned: Oral ETT  Additional Equipment:   Intra-op Plan:   Post-operative Plan: Extubation in OR  Informed Consent: I have reviewed the patients History and Physical, chart, labs and discussed the procedure including the risks, benefits and alternatives for the proposed anesthesia with the patient or authorized representative who has indicated his/her understanding and acceptance.     Dental Advisory Given  Plan Discussed with: Anesthesiologist, CRNA and Surgeon  Anesthesia Plan Comments: (Patient consented for risks of anesthesia including but not limited to:  - adverse reactions to medications - damage to eyes, teeth, lips or other oral mucosa - nerve damage due to positioning  - sore throat or hoarseness - Damage to heart, brain, nerves, lungs, other parts of body or loss of life  Patient voiced understanding.)        Anesthesia Quick Evaluation

## 2024-02-02 NOTE — Discharge Instructions (Addendum)
Orthopedic discharge instructions: Keep dressing dry and intact.  May shower after dressing changed on post-op day #4 (Monday).  Cover staples with Band-Aids after drying off. Apply ice frequently to shoulder. Take oxycodone as prescribed when needed.  May supplement with ES Tylenol if necessary. Keep shoulder immobilizer on at all times except may remove for bathing purposes. Follow-up in 10-14 days or as scheduled.

## 2024-02-02 NOTE — Transfer of Care (Signed)
 Immediate Anesthesia Transfer of Care Note  Patient: [name] White  Procedure(s) Performed: ARTHROSCOPY, SHOULDER WITH DEBRIDEMENT (Right: Shoulder) DECOMPRESSION, SUBACROMIAL SPACE (Right: Shoulder) REPAIR, ROTATOR CUFF, OPEN (Right: Shoulder) STEROID INJECTION (Left: Hip)  Patient Location: PACU  Anesthesia Type:General  Level of Consciousness: awake, alert , and patient cooperative  Airway & Oxygen Therapy: Patient Spontanous Breathing and Patient connected to face mask oxygen  Post-op Assessment: Report given to RN and Post -op Vital signs reviewed and stable  Post vital signs: Reviewed and stable  Last Vitals:  Vitals Value Taken Time  BP 121/72 02/02/24 11:46  Temp    Pulse 85 02/02/24 11:50  Resp 20 02/02/24 11:50  SpO2 100 % 02/02/24 11:50  Vitals shown include unfiled device data.  Last Pain:  Vitals:   02/02/24 0846  TempSrc: Temporal  PainSc: 5          Complications: No notable events documented.

## 2024-02-03 ENCOUNTER — Encounter: Payer: Self-pay | Admitting: Surgery

## 2024-02-12 ENCOUNTER — Other Ambulatory Visit: Payer: Self-pay | Admitting: Physician Assistant

## 2024-02-13 ENCOUNTER — Other Ambulatory Visit (HOSPITAL_COMMUNITY): Payer: Self-pay

## 2024-02-13 MED ORDER — HYDROMORPHONE HCL 2 MG PO TABS
2.0000 mg | ORAL_TABLET | Freq: Every day | ORAL | 0 refills | Status: AC
  Filled 2024-02-13: qty 150, 30d supply, fill #0

## 2024-02-28 ENCOUNTER — Encounter: Payer: Self-pay | Admitting: Surgery

## 2024-03-12 ENCOUNTER — Other Ambulatory Visit: Payer: Self-pay | Admitting: Physician Assistant

## 2024-03-15 ENCOUNTER — Telehealth: Payer: Self-pay | Admitting: Neurology

## 2024-03-15 ENCOUNTER — Encounter: Payer: Self-pay | Admitting: Neurology

## 2024-03-15 ENCOUNTER — Other Ambulatory Visit (HOSPITAL_COMMUNITY): Payer: Self-pay

## 2024-03-15 ENCOUNTER — Other Ambulatory Visit: Payer: Self-pay | Admitting: Neurology

## 2024-03-15 MED ORDER — TOPIRAMATE 200 MG PO TABS: ORAL_TABLET | ORAL | 1 refills | Status: AC

## 2024-03-15 MED ORDER — HYDROMORPHONE HCL 2 MG PO TABS
2.0000 mg | ORAL_TABLET | Freq: Every day | ORAL | 0 refills | Status: AC | PRN
  Filled 2024-03-15: qty 150, 30d supply, fill #0

## 2024-03-15 NOTE — Telephone Encounter (Signed)
 Tried calling patient last seen 01/2023 that more then an year. May have to get refills from PCP.

## 2024-03-15 NOTE — Telephone Encounter (Signed)
 [name] White called in stating that she is needing a refill for her Rx( topiramate ). She stated that she will be completely out by Saturday.   PH: [phone].

## 2024-03-16 ENCOUNTER — Telehealth: Payer: Self-pay

## 2024-03-16 NOTE — Telephone Encounter (Signed)
 Please confirm if you have paperwork that insurance company is requesting.   Copied from CRM #[id number]. Topic: General - Other >> Mar 16, 2024 11:15 AM Viola FALCON wrote: Reason for CRM: Glena from [employer] called to follow up on chronic condition form that was faxed several times since December. Confirmed correct fax number. They do need the confirmation of diagnosis by the end of January or they will be dis enrolling patient from insurance plan. They can accept it verbally, please call her with an update at [phone]

## 2024-03-16 NOTE — Telephone Encounter (Signed)
 Form completed and faxed to [facility] [employer].

## 2024-03-20 NOTE — Progress Notes (Signed)
 Thank you for your referral to [facility].  I recently contacted [name] White and discussed the benefits of [facility] with her. Unfortunately, she decided not to enroll in [facility] at this time.   As [name] Cases provider, you have the greatest influence on her care decisions. I recommend discussing the benefits of [facility] services at her next appointment and referring again at a later date.  We look forward to partnering with you on [name] care in the future. Thank you, [name]    [address]; [place], [place] [address] l  [email or web address] l [phone]   For more information on [facility] services, click here.

## 2024-03-29 ENCOUNTER — Emergency Department
Admission: EM | Admit: 2024-03-29 | Discharge: 2024-03-29 | Disposition: A | Discharge status: Home / Self Care | Attending: Emergency Medicine | Admitting: Emergency Medicine

## 2024-03-29 ENCOUNTER — Emergency Department

## 2024-03-29 ENCOUNTER — Other Ambulatory Visit: Payer: Self-pay

## 2024-03-29 ENCOUNTER — Encounter: Payer: Self-pay | Admitting: *Deleted

## 2024-03-29 DIAGNOSIS — M1612 Unilateral primary osteoarthritis, left hip: Secondary | ICD-10-CM

## 2024-03-29 DIAGNOSIS — M25552 Pain in left hip: Secondary | ICD-10-CM | POA: Insufficient documentation

## 2024-03-29 DIAGNOSIS — E119 Type 2 diabetes mellitus without complications: Secondary | ICD-10-CM | POA: Insufficient documentation

## 2024-03-29 DIAGNOSIS — W000XXA Fall on same level due to ice and snow, initial encounter: Secondary | ICD-10-CM | POA: Insufficient documentation

## 2024-03-29 MED ORDER — ACETAMINOPHEN 500 MG PO TABS
1000.0000 mg | ORAL_TABLET | Freq: Once | ORAL | Status: AC
  Administered 2024-03-29: 1000 mg via ORAL
  Filled 2024-03-29: qty 2

## 2024-03-29 NOTE — ED Triage Notes (Signed)
 Pt to triage via wheelchair.  Pt sent from [facility] with left hip fx.  Pt fell 4 days ago on the ice.  Pt alert

## 2024-03-29 NOTE — Discharge Instructions (Addendum)
 Please read through the attached instructions.

## 2024-03-29 NOTE — ED Provider Notes (Signed)
 "  [facility] Provider Note    Event Date/Time   First MD Initiated Contact with Patient 03/29/24 1757     (approximate)   History   Fall   HPI  [name] White is a 59 y.o. female  with a past medical history of chronic headache disorder, depression, PTSD, type 2 diabetes, hyperlipidemia, OSA, migraines, IBS, CAD presents to the emergency department with left hip pain after a mechanical fall 4 days ago onto ice.  Patient was sent from [facility] walk-in clinic with concerns for left hip fracture. She denies hitting her head or any other injuries.  Patient currently takes oxycodone , on a pain contract for right shoulder pain.  She has been ambulatory on the leg since the incident. She reports she has pain sleeping on her left side.  I reviewed the medical chart. PA Gretel saw her and had concerns for left femoral neck shortening and possible impacted fracture and asked her to return to the ED for further workup.  Physical Exam   Triage Vital Signs: ED Triage Vitals  Encounter Vitals Group     BP 03/29/24 1532 112/71     Girls Systolic BP Percentile --      Girls Diastolic BP Percentile --      Boys Systolic BP Percentile --      Boys Diastolic BP Percentile --      Pulse Rate 03/29/24 1532 83     Resp 03/29/24 1532 18     Temp 03/29/24 1532 98.6 F (37 C)     Temp Source 03/29/24 1532 Oral     SpO2 03/29/24 1532 99 %     Weight 03/29/24 1533 215 lb (97.5 kg)     Height 03/29/24 1533 5' 5 (1.651 m)     Head Circumference --      Peak Flow --      Pain Score 03/29/24 1533 6     Pain Loc --      Pain Education --      Exclude from Growth Chart --     Most recent vital signs: Vitals:   03/29/24 1532  BP: 112/71  Pulse: 83  Resp: 18  Temp: 98.6 F (37 C)  SpO2: 99%    General: Awake, in no acute distress. Appears stated age. CV: Good peripheral perfusion.  Respiratory:Normal respiratory effort.  No respiratory distress.  GI: Soft,  non-distended.  MSK: Normal ROM and  5/5 strength in b/l lower extremities. Full left hip ROM present. TTP along left anterior hip and down the IT band. Skin: Warm, dry, intact.  Neurological: A&Ox4 to person, place, time, and situation.  No asymmetrical calf swelling.  ED Results / Procedures / Treatments   Labs (all labs ordered are listed, but only abnormal results are displayed) Labs Reviewed - No data to display   EKG     RADIOLOGY X ray left hip FINDINGS: Frontal views of the pelvis as well as frontal and cross-table lateral views of the left hip are obtained on 4 images. A right hip arthroplasty is identified in the expected position without evidence of acute complication. There are no acute displaced fractures. Mild left hip osteoarthritis. The sacroiliac joints appear unremarkable. Soft tissues are normal.   IMPRESSION: 1. No acute displaced fracture. 2. Mild left hip osteoarthritis. 3. Unremarkable right hip arthroplasty.   PROCEDURES:  Critical Care performed: No   Procedures   MEDICATIONS ORDERED IN ED: Medications  acetaminophen  (TYLENOL ) tablet 1,000 mg (has  no administration in time range)     IMPRESSION / MDM / ASSESSMENT AND PLAN / ED COURSE  I reviewed the triage vital signs and the nursing notes.                              Differential diagnosis includes, but is not limited to, left hip sprain, hip osteoarthritis, IT band syndrome, hip fracture  Patient's presentation is most consistent with acute complicated illness / injury requiring diagnostic workup.  Patient is a 59 year old female presenting with signs and symptoms as described above.  She is neurovascularly intact. She has full ambulation and motion of the left hip.  X-ray of left hip ordered in triage.  Read by radiologist and myself, no concern for acute fracture but there are some osteoarthritic changes that are very mild.  Told her to continue her regular chronic pain management  contract at home and she can use over-the-counter medication as needed.  Discussed RICE.  Told her to follow-up with her primary doctor if her pain is not improving in 6 to 8 weeks.  The patient may return to the emergency department for any new, worsening, or concerning symptoms. Patient was given the opportunity to ask questions; all questions were answered. Emergency department return precautions were discussed with the patient.  Patient is in agreement to the treatment plan.  Patient is stable for discharge.   FINAL CLINICAL IMPRESSION(S) / ED DIAGNOSES   Final diagnoses:  Left hip pain     Rx / DC Orders   ED Discharge Orders     None        Note:  This document was prepared using Dragon voice recognition software and may include unintentional dictation errors.     Sheron Salm, PA-C 03/29/24 1850    Arlander Charleston, MD 03/29/24 2232  "

## 2024-03-29 NOTE — Progress Notes (Signed)
 [facility]                    PT Daily Progress Note  Patients Name:[name] White                        MRN #[id number]      Date:03/29/2024                        Visit Number: 5                                                                      Progress Related to Long and Short Term Goals:   []  PROM:    Right shoulder flexion = 92 []  Strength Pain -    while- [] at rest    [] During activity:  []  Functional goal: []  Other: []  Balance:  Patient / Family Education:    []  Progressed HEP to include:  []  Reviewed previous HEP: ______________________________________________________________________  Skilled Service Provided:  To: [x]  right   []  left     []  bilateral    []   core/body stability and balance  []  neck   [x]   shoulder   []  upper arm/  []  elbow   []  lower arm  []  wrist   []  hand   []   fingers  []  back     []  hip    []  upper leg    []  knee  []  lower leg   []   ankle  foot       Ther Ex Therapeutic Procedure CPT 97110 : 25 minutes   Manual Tx Manual Therapy CPT 97140: 20 minutes    Ice / Heat             E-Stim                 Infrared     Vaso-pneumatic compression      []  Inc ROM [x] Inc joint mob [x] Dec pain [] Dec inflam Setting:  [] Dec swelling  []  Inc Flex [x]  Inc Flex [x] Dec inflam [] Dec pain [] Inc Healing [] Dec edema  []  Inc  Bal [x]  Inc ROM [x] Dec swelling [] Dec swelling [] Dec pain Level      []  1        []  2      []  3  []  Inc Strength [] Astym [] Inc circulation [] Inc circulation [] Inc circulation Temp:         F  Progressive  HEP          []   [] tension/ spasms/soft tissue mob [] Inc soft tissue mobility [] Inc tissue mob    [] Inc Posture [] Correct malaign  Settings:      Subjective: Patient reports falling this week. States she twisted to avoid her shoulder and hurt her hip. Planning to schedule an app with orthopedics to have it looked at. Treatment notes:   Manual treatment for the right shoulder as noted below.      Assessment of Treatment: Patient performed therex per flow chart. Improved tolerance for both therex and manual rx today. Patient declined cp post treatment.  Patients Response to Treatment:  []  pain  []  pain    []   flexibility      []   endurance      []    strength     []   AROM      [x]   PROM       []   Spasms    []  Posture / alignment    []   Joint Mobility  []  Other:   Functional Improvement Noted:  Increased ability to  []  walk   []  stand for longer periods   []  dress with less help    []  lift     []   reach     []   bend    []  Other:  Remaining Impairment Requiring Continued Treatment:   [x]   flexibility   [x]   ROM   [x]   pain  [x]  strength   [x]   weakness    []   balance   []   swelling   []   inflammation        []  Spasm  []  posture/alignment    []   Other:  Plan: [x]   Continue Plan of Care        []   Add:           []   Discharge after next visit    []   Discharge to HEP   []  Other:   UE Treatment Log    Date  02/07/24 12/30 02/29/24 1/23 2/5       UBE            wall walk     x10       Pulley    2x5' 2x5'       Flexion            Scaption            Abduction            T-Band IR            T-Band ER  0/ 45/  90             Scap Retraction            Shoulder Extension            Lat Pulls            F. M.  Rows            Tricep Ext            Bicep Curls            Shoulder Shrugs            wall pushups            S/L ER            Horiz Abd @ 90 prone/stand            Horiz Abd @ 120 prone/stand            Prone Extension            Prone Rows            Serratus Punch            PNFD2 Flex/Ext            HEP:  Passive right elbow flexion/extension and active forearm, wrist and hand all planes.  The patient will also perform shoulder rolls.  Ex's will be performed 2-3x/day 25-30 reps each. 10'           Table flex/ER     X30 ea       AAROM w/ cane  Press/er 2x10                                           Manual-   shld PROM all planes, GH post & inf glides,  scapular rot, protraction & retraction   25' 25' 20' 20'       Ultrasound/ Infrared            Cold pack to right shoulder  CP 10' 10' 10' PD         PT Billing Documentation Date of Onset: 02/02/24 Visit Number: 5       Frequently Used Timed Codes Therapeutic Procedure CPT 97110 : 25 minutes Manual Therapy CPT 97140: 20 minutes                 Total Treatment Time: 45 minutes    Attestation Statement:   I personally performed the service, incident to.  (I2)   Rollene Bellman, PTA

## 2024-04-10 ENCOUNTER — Telehealth (HOSPITAL_COMMUNITY): Admitting: Psychiatry

## 2024-09-24 ENCOUNTER — Ambulatory Visit: Payer: Self-pay | Admitting: Neurology

## 2024-10-25 ENCOUNTER — Ambulatory Visit
